# Patient Record
Sex: Female | Born: 1945
Health system: Southern US, Community
[De-identification: ages and names within clinical notes are randomized; demographics above are authoritative.]

## PROBLEM LIST (undated history)

## (undated) DIAGNOSIS — I480 Paroxysmal atrial fibrillation: Secondary | ICD-10-CM

## (undated) DIAGNOSIS — Z7901 Long term (current) use of anticoagulants: Secondary | ICD-10-CM

## (undated) DIAGNOSIS — I209 Angina pectoris, unspecified: Secondary | ICD-10-CM

## (undated) DIAGNOSIS — Z91148 Patient's other noncompliance with medication regimen for other reason: Secondary | ICD-10-CM

## (undated) DIAGNOSIS — I2510 Atherosclerotic heart disease of native coronary artery without angina pectoris: Secondary | ICD-10-CM

## (undated) DIAGNOSIS — G4733 Obstructive sleep apnea (adult) (pediatric): Secondary | ICD-10-CM

## (undated) DIAGNOSIS — Z9114 Patient's other noncompliance with medication regimen: Secondary | ICD-10-CM

## (undated) DIAGNOSIS — I428 Other cardiomyopathies: Secondary | ICD-10-CM

## (undated) DIAGNOSIS — I10 Essential (primary) hypertension: Secondary | ICD-10-CM

## (undated) DIAGNOSIS — E7800 Pure hypercholesterolemia, unspecified: Secondary | ICD-10-CM

## (undated) DIAGNOSIS — I5022 Chronic systolic (congestive) heart failure: Secondary | ICD-10-CM

## (undated) DIAGNOSIS — Z9989 Dependence on other enabling machines and devices: Secondary | ICD-10-CM

## (undated) DIAGNOSIS — E669 Obesity, unspecified: Secondary | ICD-10-CM

## (undated) DIAGNOSIS — Z95810 Presence of automatic (implantable) cardiac defibrillator: Secondary | ICD-10-CM

## (undated) DIAGNOSIS — R0602 Shortness of breath: Secondary | ICD-10-CM

## (undated) HISTORY — PX: ABDOMINAL HYSTERECTOMY: SHX81

## (undated) HISTORY — DX: Long term (current) use of anticoagulants: Z79.01

## (undated) HISTORY — DX: Atherosclerotic heart disease of native coronary artery without angina pectoris: I25.10

## (undated) HISTORY — PX: TUBAL LIGATION: SHX77

## (undated) HISTORY — PX: CARDIAC CATHETERIZATION: SHX172

---

## 2001-08-13 ENCOUNTER — Inpatient Hospital Stay (HOSPITAL_COMMUNITY): Admission: EM | Admit: 2001-08-13 | Discharge: 2001-08-18 | Payer: Self-pay | Admitting: Emergency Medicine

## 2001-08-13 ENCOUNTER — Encounter: Payer: Self-pay | Admitting: Emergency Medicine

## 2001-08-15 ENCOUNTER — Encounter: Payer: Self-pay | Admitting: Cardiology

## 2004-05-26 ENCOUNTER — Ambulatory Visit: Payer: Self-pay | Admitting: Cardiovascular Disease

## 2004-06-23 ENCOUNTER — Ambulatory Visit: Payer: Self-pay

## 2004-12-31 ENCOUNTER — Ambulatory Visit (HOSPITAL_BASED_OUTPATIENT_CLINIC_OR_DEPARTMENT_OTHER): Admission: RE | Admit: 2004-12-31 | Discharge: 2004-12-31 | Payer: Self-pay | Admitting: Cardiovascular Disease

## 2004-12-31 ENCOUNTER — Ambulatory Visit: Payer: Self-pay | Admitting: *Deleted

## 2004-12-31 ENCOUNTER — Encounter: Payer: Self-pay | Admitting: Pulmonary Disease

## 2005-01-09 ENCOUNTER — Ambulatory Visit: Payer: Self-pay | Admitting: Pulmonary Disease

## 2005-02-02 ENCOUNTER — Ambulatory Visit: Payer: Self-pay | Admitting: Cardiovascular Disease

## 2005-02-20 ENCOUNTER — Ambulatory Visit: Payer: Self-pay | Admitting: Pulmonary Disease

## 2005-03-15 ENCOUNTER — Ambulatory Visit (HOSPITAL_BASED_OUTPATIENT_CLINIC_OR_DEPARTMENT_OTHER): Admission: RE | Admit: 2005-03-15 | Discharge: 2005-03-15 | Payer: Self-pay | Admitting: Pulmonary Disease

## 2005-03-15 ENCOUNTER — Encounter: Payer: Self-pay | Admitting: Pulmonary Disease

## 2005-03-24 ENCOUNTER — Ambulatory Visit: Payer: Self-pay | Admitting: Pulmonary Disease

## 2005-04-01 ENCOUNTER — Ambulatory Visit: Payer: Self-pay | Admitting: Pulmonary Disease

## 2005-07-13 ENCOUNTER — Encounter: Payer: Self-pay | Admitting: Cardiology

## 2005-07-13 ENCOUNTER — Ambulatory Visit: Payer: Self-pay | Admitting: Cardiology

## 2005-07-13 ENCOUNTER — Inpatient Hospital Stay (HOSPITAL_COMMUNITY): Admission: EM | Admit: 2005-07-13 | Discharge: 2005-07-16 | Payer: Self-pay | Admitting: Emergency Medicine

## 2005-07-30 ENCOUNTER — Ambulatory Visit: Payer: Self-pay | Admitting: Cardiovascular Disease

## 2005-08-12 ENCOUNTER — Ambulatory Visit: Payer: Self-pay

## 2005-09-15 ENCOUNTER — Ambulatory Visit: Payer: Self-pay | Admitting: Cardiovascular Disease

## 2007-02-01 ENCOUNTER — Ambulatory Visit: Payer: Self-pay | Admitting: Cardiovascular Disease

## 2007-02-01 ENCOUNTER — Inpatient Hospital Stay (HOSPITAL_COMMUNITY): Admission: EM | Admit: 2007-02-01 | Discharge: 2007-02-11 | Payer: Self-pay | Admitting: Emergency Medicine

## 2007-02-16 ENCOUNTER — Ambulatory Visit: Payer: Self-pay | Admitting: Cardiovascular Disease

## 2007-02-16 LAB — CONVERTED CEMR LAB: Prothrombin Time: 25.2 s — ABNORMAL HIGH (ref 10.9–13.3)

## 2007-02-24 ENCOUNTER — Ambulatory Visit: Payer: Self-pay | Admitting: Cardiovascular Disease

## 2007-02-24 ENCOUNTER — Ambulatory Visit: Payer: Self-pay | Admitting: Cardiology

## 2007-02-24 LAB — CONVERTED CEMR LAB: Prothrombin Time: 31.6 s (ref 10.9–13.3)

## 2007-02-28 ENCOUNTER — Ambulatory Visit: Payer: Self-pay | Admitting: Cardiology

## 2007-03-08 ENCOUNTER — Ambulatory Visit: Payer: Self-pay | Admitting: Internal Medicine

## 2007-03-22 ENCOUNTER — Ambulatory Visit: Payer: Self-pay | Admitting: Cardiology

## 2007-03-29 ENCOUNTER — Ambulatory Visit: Payer: Self-pay | Admitting: Cardiovascular Disease

## 2007-04-05 ENCOUNTER — Ambulatory Visit: Payer: Self-pay | Admitting: Cardiovascular Disease

## 2007-04-26 ENCOUNTER — Ambulatory Visit: Payer: Self-pay | Admitting: Cardiovascular Disease

## 2007-04-26 LAB — CONVERTED CEMR LAB
Calcium: 8.9 mg/dL (ref 8.4–10.5)
Creatinine, Ser: 1.3 mg/dL — ABNORMAL HIGH (ref 0.4–1.2)
Glucose, Bld: 90 mg/dL (ref 70–99)
Pro B Natriuretic peptide (BNP): 143 pg/mL — ABNORMAL HIGH (ref 0.0–100.0)
Sodium: 142 meq/L (ref 135–145)

## 2007-05-10 ENCOUNTER — Ambulatory Visit: Payer: Self-pay | Admitting: Cardiovascular Disease

## 2007-05-24 ENCOUNTER — Ambulatory Visit: Payer: Self-pay | Admitting: Internal Medicine

## 2007-06-08 ENCOUNTER — Ambulatory Visit: Payer: Self-pay | Admitting: Cardiovascular Disease

## 2007-06-24 ENCOUNTER — Ambulatory Visit: Payer: Self-pay | Admitting: Cardiology

## 2007-07-04 ENCOUNTER — Ambulatory Visit: Payer: Self-pay | Admitting: Cardiology

## 2007-08-02 ENCOUNTER — Ambulatory Visit: Payer: Self-pay | Admitting: Cardiology

## 2007-08-16 ENCOUNTER — Ambulatory Visit: Payer: Self-pay | Admitting: Internal Medicine

## 2007-09-13 ENCOUNTER — Ambulatory Visit: Payer: Self-pay | Admitting: Cardiology

## 2007-09-22 ENCOUNTER — Ambulatory Visit: Payer: Self-pay | Admitting: Cardiology

## 2007-10-06 ENCOUNTER — Ambulatory Visit: Payer: Self-pay | Admitting: Cardiology

## 2007-11-02 ENCOUNTER — Ambulatory Visit: Payer: Self-pay | Admitting: Cardiovascular Disease

## 2007-11-20 DIAGNOSIS — G4731 Primary central sleep apnea: Secondary | ICD-10-CM

## 2007-11-20 DIAGNOSIS — I509 Heart failure, unspecified: Secondary | ICD-10-CM | POA: Insufficient documentation

## 2007-11-20 DIAGNOSIS — I272 Pulmonary hypertension, unspecified: Secondary | ICD-10-CM

## 2007-11-20 DIAGNOSIS — I4891 Unspecified atrial fibrillation: Secondary | ICD-10-CM

## 2007-11-20 DIAGNOSIS — I2510 Atherosclerotic heart disease of native coronary artery without angina pectoris: Secondary | ICD-10-CM

## 2007-12-07 ENCOUNTER — Ambulatory Visit: Payer: Self-pay | Admitting: Internal Medicine

## 2007-12-16 ENCOUNTER — Ambulatory Visit: Payer: Self-pay | Admitting: Cardiology

## 2008-01-04 ENCOUNTER — Ambulatory Visit: Payer: Self-pay | Admitting: Cardiology

## 2008-01-24 ENCOUNTER — Ambulatory Visit: Payer: Self-pay | Admitting: Cardiology

## 2008-02-21 ENCOUNTER — Ambulatory Visit: Payer: Self-pay | Admitting: Cardiology

## 2008-03-20 ENCOUNTER — Ambulatory Visit: Payer: Self-pay | Admitting: Cardiology

## 2008-04-17 ENCOUNTER — Ambulatory Visit: Payer: Self-pay | Admitting: Cardiology

## 2008-04-25 ENCOUNTER — Telehealth: Payer: Self-pay | Admitting: Cardiovascular Disease

## 2008-05-02 ENCOUNTER — Telehealth: Payer: Self-pay | Admitting: Cardiovascular Disease

## 2008-05-15 ENCOUNTER — Ambulatory Visit: Payer: Self-pay | Admitting: Internal Medicine

## 2008-05-23 ENCOUNTER — Telehealth: Payer: Self-pay | Admitting: Cardiovascular Disease

## 2008-05-31 ENCOUNTER — Encounter: Payer: Self-pay | Admitting: Cardiovascular Disease

## 2008-06-05 ENCOUNTER — Encounter: Payer: Self-pay | Admitting: *Deleted

## 2008-06-07 ENCOUNTER — Telehealth: Payer: Self-pay | Admitting: Cardiovascular Disease

## 2008-06-19 ENCOUNTER — Ambulatory Visit: Payer: Self-pay | Admitting: Internal Medicine

## 2008-06-19 ENCOUNTER — Encounter (INDEPENDENT_AMBULATORY_CARE_PROVIDER_SITE_OTHER): Payer: Self-pay | Admitting: Cardiology

## 2008-06-19 LAB — CONVERTED CEMR LAB: POC INR: 2.1

## 2008-07-11 ENCOUNTER — Encounter: Payer: Self-pay | Admitting: *Deleted

## 2008-07-17 ENCOUNTER — Ambulatory Visit: Payer: Self-pay | Admitting: Cardiovascular Disease

## 2008-07-17 ENCOUNTER — Encounter (INDEPENDENT_AMBULATORY_CARE_PROVIDER_SITE_OTHER): Payer: Self-pay | Admitting: Cardiology

## 2008-07-17 LAB — CONVERTED CEMR LAB: Prothrombin Time: 16.1 s

## 2008-08-13 ENCOUNTER — Ambulatory Visit: Payer: Self-pay | Admitting: Cardiology

## 2008-09-11 ENCOUNTER — Ambulatory Visit: Payer: Self-pay | Admitting: Cardiology

## 2008-09-11 LAB — CONVERTED CEMR LAB: POC INR: 4.3

## 2008-10-08 ENCOUNTER — Encounter: Payer: Self-pay | Admitting: Cardiovascular Disease

## 2008-10-16 ENCOUNTER — Ambulatory Visit: Payer: Self-pay | Admitting: Cardiology

## 2008-11-22 ENCOUNTER — Ambulatory Visit: Payer: Self-pay | Admitting: Cardiology

## 2008-11-22 LAB — CONVERTED CEMR LAB: POC INR: 3.2

## 2008-12-13 ENCOUNTER — Ambulatory Visit: Payer: Self-pay | Admitting: Cardiovascular Disease

## 2009-01-06 ENCOUNTER — Emergency Department (HOSPITAL_COMMUNITY): Admission: EM | Admit: 2009-01-06 | Discharge: 2009-01-06 | Payer: Self-pay | Admitting: Emergency Medicine

## 2009-01-25 ENCOUNTER — Encounter (INDEPENDENT_AMBULATORY_CARE_PROVIDER_SITE_OTHER): Payer: Self-pay | Admitting: Cardiology

## 2009-02-27 ENCOUNTER — Encounter (INDEPENDENT_AMBULATORY_CARE_PROVIDER_SITE_OTHER): Payer: Self-pay | Admitting: Cardiology

## 2009-04-06 ENCOUNTER — Ambulatory Visit: Payer: Self-pay | Admitting: Cardiovascular Disease

## 2009-04-06 ENCOUNTER — Ambulatory Visit: Payer: Self-pay | Admitting: Diagnostic Radiology

## 2009-04-06 ENCOUNTER — Encounter: Payer: Self-pay | Admitting: Emergency Medicine

## 2009-04-06 ENCOUNTER — Inpatient Hospital Stay (HOSPITAL_COMMUNITY): Admission: EM | Admit: 2009-04-06 | Discharge: 2009-04-07 | Payer: Self-pay | Admitting: Cardiovascular Disease

## 2009-04-06 ENCOUNTER — Encounter: Payer: Self-pay | Admitting: Cardiovascular Disease

## 2009-04-10 ENCOUNTER — Encounter: Payer: Self-pay | Admitting: Cardiovascular Disease

## 2009-04-10 ENCOUNTER — Ambulatory Visit: Payer: Self-pay | Admitting: Cardiology

## 2009-04-19 ENCOUNTER — Ambulatory Visit: Payer: Self-pay | Admitting: Internal Medicine

## 2009-04-19 LAB — CONVERTED CEMR LAB: POC INR: 1.9

## 2009-05-02 ENCOUNTER — Ambulatory Visit: Payer: Self-pay | Admitting: Cardiology

## 2009-05-02 ENCOUNTER — Ambulatory Visit: Payer: Self-pay | Admitting: Cardiovascular Disease

## 2009-05-02 LAB — CONVERTED CEMR LAB: POC INR: 4.4

## 2009-05-07 ENCOUNTER — Encounter (INDEPENDENT_AMBULATORY_CARE_PROVIDER_SITE_OTHER): Payer: Self-pay | Admitting: *Deleted

## 2009-05-16 ENCOUNTER — Ambulatory Visit: Payer: Self-pay | Admitting: Pulmonary Disease

## 2009-05-17 ENCOUNTER — Ambulatory Visit: Payer: Self-pay | Admitting: Cardiovascular Disease

## 2009-05-17 ENCOUNTER — Ambulatory Visit (HOSPITAL_COMMUNITY): Admission: RE | Admit: 2009-05-17 | Discharge: 2009-05-17 | Payer: Self-pay | Admitting: Cardiovascular Disease

## 2009-06-06 ENCOUNTER — Ambulatory Visit: Payer: Self-pay | Admitting: Internal Medicine

## 2009-06-27 ENCOUNTER — Ambulatory Visit: Payer: Self-pay | Admitting: Internal Medicine

## 2009-06-27 ENCOUNTER — Ambulatory Visit: Payer: Self-pay | Admitting: Cardiovascular Disease

## 2009-07-01 ENCOUNTER — Ambulatory Visit: Payer: Self-pay | Admitting: Pulmonary Disease

## 2009-07-18 ENCOUNTER — Ambulatory Visit: Payer: Self-pay | Admitting: Internal Medicine

## 2009-07-18 LAB — CONVERTED CEMR LAB: POC INR: 1.6

## 2009-08-01 ENCOUNTER — Ambulatory Visit: Payer: Self-pay | Admitting: Internal Medicine

## 2009-08-01 LAB — CONVERTED CEMR LAB: POC INR: 1.8

## 2009-09-27 ENCOUNTER — Encounter (INDEPENDENT_AMBULATORY_CARE_PROVIDER_SITE_OTHER): Payer: Self-pay | Admitting: Pharmacist

## 2009-11-11 ENCOUNTER — Telehealth (INDEPENDENT_AMBULATORY_CARE_PROVIDER_SITE_OTHER): Payer: Self-pay | Admitting: *Deleted

## 2010-01-01 ENCOUNTER — Ambulatory Visit: Payer: Self-pay | Admitting: Pulmonary Disease

## 2010-01-29 ENCOUNTER — Encounter (INDEPENDENT_AMBULATORY_CARE_PROVIDER_SITE_OTHER): Payer: Self-pay | Admitting: Pharmacist

## 2010-02-04 NOTE — Medication Information (Signed)
Summary: rov/sp  Anticoagulant Therapy  Managed by: Leota Sauers, PharmD, BCPS, CPP Referring MD: Dr. Charlton Haws Supervising MD: Daleen Squibb MD, Maisie Fus Indication 1: Atrial Fibrillation (ICD-427.31) Lab Used: [facility] [place] Site: [address] INR POC 4.4 INR RANGE 2-3  Dietary changes: yes       Details: decreased greens  Health status changes: no    Bleeding/hemorrhagic complications: no    Recent/future hospitalizations: no    Any changes in medication regimen? no    Recent/future dental: no  Any missed doses?: yes     Details: missed one day  Is patient compliant with meds? yes       Current Medications (verified): 1)  Aspirin 81 Mg Tbec (Aspirin) .... Take One Tablet By Mouth Daily 2)  Furosemide 20 Mg Tabs (Furosemide) .... Take 3 Tabs Two Times A Day. 3)  Coreg 12.5 Mg Tabs (Carvedilol) .Marland Kitchen.. 1 Tab Two Times A Day 4)  Amiodarone Hcl 200 Mg Tabs (Amiodarone Hcl) .... Take One Tablet By Mouth Two Times A Day 5)  Cozaar 25 Mg Tabs (Losartan Potassium) .... Take One Tablet By Mouth Daily 6)  Hydralazine Hcl 50 Mg Tabs (Hydralazine Hcl) .... 1/2 Tab Three Times A Day 7)  Revatio 20 Mg Tabs (Sildenafil Citrate) .Marland Kitchen.. 1 Tab Three Times A Day 8)  Potassium Chloride Crys Cr 20 Meq Cr-Tabs (Potassium Chloride Crys Cr) .... Take One Tablet By Mouth Daily 9)  Warfarin Sodium 5 Mg Tabs (Warfarin Sodium) .... Use As Directed By Anticoagulation Clinic  Allergies (verified): No Known Drug Allergies  Anticoagulation Management History:      The patient is taking warfarin and comes in today for a routine follow up visit.  Negative risk factors for bleeding include an age less than 37 years old.  The bleeding index is 'low risk'.  Positive CHADS2 values include History of CHF.  Negative CHADS2 values include Age > 41 years old.  The start date was 02/10/2007.  Her last INR was 5.7 RATIO.  Anticoagulation responsible provider: Daleen Squibb MD, Maisie Fus.  INR POC: 4.4.  Cuvette Lot#:  [id number].  Exp: 05/2010.    Anticoagulation Management Assessment/Plan:      The patient's current anticoagulation dose is Warfarin sodium 5 mg tabs: Use as directed by Anticoagulation Clinic.  The target INR is 2.0-3.0.  The next INR is due 05/16/2009.  Anticoagulation instructions were given to patient.  Results were reviewed/authorized by Leota Sauers, PharmD, BCPS, CPP.         Prior Anticoagulation Instructions: INR 1.9. Take 1 tablet today, then take 1 tablet daily except 0.5 tablet Mon, Wed, Fri.  Current Anticoagulation Instructions: INR 4.4  Ok to eat some greens 2-3 days a week No Coumadin today Thur 4/28 Then coumadin 1 tab = 5mg  on Tu, Thur, Sat, Sun 1/2 tab = 2.5 mg on Mon, Wed , Fri

## 2010-02-04 NOTE — Assessment & Plan Note (Signed)
Summary: eph/dm   CC:  sob.  History of Present Illness: Erin [name] returns today for F/U of anticoagulaiton, afib, DCM non-ischemic.  Recent hospitalization for arm pain R/O .  Normal cath in 2009.  At that time her PA pressure was 65 mmHg systolic.  She is functional class 2.  She was seen in the ER for 'bronchitis" since hospital D/C and Rx with breathing Rx's.  I filled out a handicap placard for her today.  She has exertional dyspnea but less edema and no SSCP, palpitations, or syncope.  She has not had her EF checked since cath in 2009 when it was 15-20%.  She has had PAF and is on chronic anticoagulation and amiodarone.  She is going to get her coumadin checked today as it has been low the last few weeks.  She needs F/U with Dr Shelle Iron for sleep apnea.  She does not like to wear her mask.  I discussed the importance of oxygenation at night in regard to her CHF.  She is tolerating her revatio for pulmonary hypertension with no side effects.  I think the best test to reassess her CHF would be an MRI as echo images are poor due to her body habitus.  She will need F/u DlCO and TSH, LFTS, BNP in about 8 weeks to assess her CHF and follow her amiodarone.  Current Problems (verified): 1)  Coumadin Therapy  (ICD-V58.61) 2)  Coronary Atherosclerosis Native Coronary Artery  (ICD-414.01) 3)  Obstructive Sleep Apnea  (ICD-327.23) 4)  Pulmonary Hypertension  (ICD-416.8) 5)  Congestive Heart Failure, Chronic  (ICD-428.0) 6)  Atrial Fibrillation  (ICD-427.31)  Current Medications (verified): 1)  Aspirin 81 Mg Tbec (Aspirin) .... Take One Tablet By Mouth Daily 2)  Furosemide 20 Mg Tabs (Furosemide) .... Take 3 Tabs Two Times A Day. 3)  Coreg 12.5 Mg Tabs (Carvedilol) .Marland Kitchen.. 1 Tab Two Times A Day 4)  Amiodarone Hcl 200 Mg Tabs (Amiodarone Hcl) .... Take One Tablet By Mouth Two Times A Day 5)  Cozaar 25 Mg Tabs (Losartan Potassium) .... Take One Tablet By Mouth Daily 6)  Hydralazine Hcl 50 Mg Tabs (Hydralazine  Hcl) .... 1/2 Tab Three Times A Day 7)  Revatio 20 Mg Tabs (Sildenafil Citrate) .Marland Kitchen.. 1 Tab Three Times A Day 8)  Potassium Chloride Crys Cr 20 Meq Cr-Tabs (Potassium Chloride Crys Cr) .... Take One Tablet By Mouth Daily 9)  Warfarin Sodium 5 Mg Tabs (Warfarin Sodium) .... Use As Directed By Anticoagulation Clinic  Allergies (verified): No Known Drug Allergies  Past History:  Past Medical History: Last updated: 11/20/2007 Congestive Heart Failure: non-ischemic but moderate LAD disease Pulmonary Hypertension PAF/Atrial Tachycardia COPD Obesity Sleep Apnea Mitral Regurgitation Hypertension EPS:  02/03/2007 Graciela Husbands no AICD due to comorbidities and Class 4 symptoms  Past Surgical History: Last updated: 11/20/2007 Hysterectomy:  1996 fibroids  Family History: Last updated: 11/20/2007 Mother, Father and Brother with M.I.  Social History: Last updated: 11/20/2007 Lives in [place] with daughter Non-smoker Non-drinker  Review of Systems       Denies fever, malais, weight loss, blurry vision, decreased visual acuity, cough, sputum, hemoptysis, pleuritic pain, palpitaitons, heartburn, abdominal pain, melena, lower extremity edema, claudication, or rash.   Vital Signs:  Patient profile:   65 year old female Height:      61 inches Weight:      226 pounds BMI:     42.86 Pulse rate:   79 / minute Resp:     14 per minute BP  sitting:   151 / 88  (left arm)  Vitals Entered By: Kem Parkinson (May 02, 2009 9:12 AM)  Physical Exam  General:  Affect appropriate Healthy:  appears stated age HEENT: normal Neck supple with no adenopathy JVP normal no bruits no thyromegaly Lungs clear with no wheezing and good diaphragmatic motion Heart:  S1/S2 no murmur,rub, gallop or click PMI normal Abdomen: benighn, BS positve, no tenderness, no AAA no bruit.  No HSM or HJR Distal pulses intact with no bruits No edema Neuro non-focal Skin warm and dry    Impression &  Recommendations:  Problem # 1:  COUMADIN THERAPY (ICD-V58.61) F/U coumadin clinic today.  No bleeding diathesis  Problem # 2:  OBSTRUCTIVE SLEEP APNEA (ICD-327.23) F/U Dr Shelle Iron to see if we can improve compliance with CPAP.    Problem # 3:  PULMONARY HYPERTENSION (ICD-416.8) Secondary to LV dysfunction Continue revatio and current dose of diuretics.    Problem # 4:  CONGESTIVE HEART FAILURE, CHRONIC (ICD-428.0) Stable.  MRI to assess RV and LV function.  F.U SK to reconsider biV pacer.  BNP in 8 weeks Her updated medication list for this problem includes:    Aspirin 81 Mg Tbec (Aspirin) .Marland Kitchen... Take one tablet by mouth daily    Furosemide 20 Mg Tabs (Furosemide) .Marland Kitchen... Take 3 tabs two times a day.    Coreg 12.5 Mg Tabs (Carvedilol) .Marland Kitchen... 1 tab two times a day    Amiodarone Hcl 200 Mg Tabs (Amiodarone hcl) .Marland Kitchen... Take one tablet by mouth two times a day    Cozaar 25 Mg Tabs (Losartan potassium) .Marland Kitchen... Take one tablet by mouth daily    Warfarin Sodium 5 Mg Tabs (Warfarin sodium) ..... Use as directed by anticoagulation clinic  Orders: EP Referral (Cardiology EP Ref )  Problem # 5:  ATRIAL FIBRILLATION (ICD-427.31) Maint NSR.  INR today,  Continue low dose amiodarone with surveilance DLCO and labs in 8 weeks Her updated medication list for this problem includes:    Aspirin 81 Mg Tbec (Aspirin) .Marland Kitchen... Take one tablet by mouth daily    Coreg 12.5 Mg Tabs (Carvedilol) .Marland Kitchen... 1 tab two times a day    Amiodarone Hcl 200 Mg Tabs (Amiodarone hcl) .Marland Kitchen... Take one tablet by mouth two times a day    Warfarin Sodium 5 Mg Tabs (Warfarin sodium) ..... Use as directed by anticoagulation clinic  Other Orders: Pulmonary Referral (Pulmonary) Cardiac MRI (Cardiac MRI)  Patient Instructions: 1)  Your physician recommends that you schedule a follow-up appointment in: 2 months 2)  You have been referred to Dr. Graciela Husbands (EP doctor) and Dr. Shelle Iron (pulmonary) 3)  Your physician has requested that you have a  cardiac MRI.  Cardiac MRI uses a computer to create images of your heart as it's beating, producing both still and moving pictures of your heart and major blood vessels. For further information please visit  [email or web address]  Please follow the instruction sheet given to you today for more information.   EKG Report  Procedure date:  04/06/2009  Findings:      NSR 76 Nonspecific ST/T wave abnormalities QT 434 No significant change since prev

## 2010-02-04 NOTE — Assessment & Plan Note (Signed)
Summary: rov for osa   Copy to:  Charlton Haws Primary Provider/Referring Provider:  None  CC:  Pt is here for a 4 week f/u appt.    Pt tates she is using her cpap machine "most nights."  Approx 5 to 6 hours per night.  Pt states she has difficulty with chin strap staying on  and also c/o mask leaks.  Pt denied any complaints with pressure.  Marland Kitchen  History of Present Illness: the pt comes in today for f/u of here severe complex apnea.  She was started on bipap at last visit at a low pressure level to allow for desensitization.  She has been able to wear the device for 5-6 hrs at a time, and thnks is better than cpap.  She is not having issues with pressure tolerance.  She is having issues with mask fit however.  She is using nasal mask with chin strap, and is very uncomfortable for her.  The chin strap keeps slipping, and the current mask leaks.  She is not able to tell yet whether this will help her sleep.  Medications Prior to Update: 1)  Aspirin 81 Mg Tbec (Aspirin) .... Take One Tablet By Mouth Daily 2)  Furosemide 20 Mg Tabs (Furosemide) .... Take 3 Tabs Two Times A Day. 3)  Coreg 12.5 Mg Tabs (Carvedilol) .Marland Kitchen.. 1 Tab Two Times A Day 4)  Amiodarone Hcl 200 Mg Tabs (Amiodarone Hcl) .... Take One Tablet By Mouth Two Times A Day 5)  Cozaar 25 Mg Tabs (Losartan Potassium) .... Take One Tablet By Mouth Daily 6)  Hydralazine Hcl 50 Mg Tabs (Hydralazine Hcl) .... 1/2 Tab Three Times A Day 7)  Revatio 20 Mg Tabs (Sildenafil Citrate) .Marland Kitchen.. 1 Tab Three Times A Day 8)  Potassium Chloride Crys Cr 20 Meq Cr-Tabs (Potassium Chloride Crys Cr) .... Take One Tablet By Mouth Daily 9)  Warfarin Sodium 5 Mg Tabs (Warfarin Sodium) .... Use As Directed By Anticoagulation Clinic  Allergies (verified): No Known Drug Allergies  Past History:  Past medical, surgical, family and social histories (including risk factors) reviewed, and no changes noted (except as noted below).  Past Medical History: Reviewed history  from 11/20/2007 and no changes required. Congestive Heart Failure: non-ischemic but moderate LAD disease Pulmonary Hypertension PAF/Atrial Tachycardia COPD Obesity Sleep Apnea Mitral Regurgitation Hypertension EPS:  02/03/2007 Graciela Husbands no AICD due to comorbidities and Class 4 symptoms  Past Surgical History: Reviewed history from 11/20/2007 and no changes required. Hysterectomy:  1996 fibroids  Family History: Reviewed history from 11/20/2007 and no changes required. Mother, Father and Brother with M.I.  Social History: Reviewed history from 05/16/2009 and no changes required. Lives in [place] with daughter Never smoker Non-drinker pt is single. pt has children. pt is retired. prev worked at a bank.   Review of Systems       The patient complains of nasal congestion/difficulty breathing through nose.  The patient denies shortness of breath with activity, shortness of breath at rest, productive cough, non-productive cough, coughing up blood, chest pain, irregular heartbeats, acid heartburn, indigestion, loss of appetite, weight change, abdominal pain, difficulty swallowing, sore throat, tooth/dental problems, headaches, sneezing, itching, ear ache, anxiety, depression, hand/feet swelling, joint stiffness or pain, rash, change in color of mucus, and fever.    Vital Signs:  Patient profile:   65 year old female Height:      61 inches Weight:      219 pounds BMI:     41.53 O2 Sat:  95 % on Room air Temp:     98.0 degrees F oral Pulse rate:   74 / minute BP sitting:   114 / 62  (left arm) Cuff size:   regular  Vitals Entered By: Arman Filter LPN (July 01, 2009 4:03 PM)  O2 Flow:  Room air CC: Pt is here for a 4 week f/u appt.    Pt tates she is using her cpap machine "most nights."  Approx 5 to 6 hours per night.  Pt states she has difficulty with chin strap staying on  and also c/o mask leaks.  Pt denied any complaints with pressure.   Comments Medications reviewed  with patient Arman Filter LPN  July 01, 2009 4:13 PM    Physical Exam  General:  obese female in nad Nose:  no skin breakdown or pressure necrosis from cpap mask Extremities:  mild edema, no cyanosis Neurologic:  alert and oriented, moves all 4.   Impression & Recommendations:  Problem # 1:  OBSTRUCTIVE SLEEP APNEA (ICD-327.23) the pt has severe complex sleep apnea, and we are trying to get her adapted to a positive pressure device.  She is tolerating bipap more than cpap, but we have her at a low level of pressure.  She is not doing well with a nasal mask and chin strap, so will see if we can get her a full face mask that she can tolerate.  We also need to have her pressure optimized at sleep center given her complex apnea, and she is agreeable.  I have also encouraged her again to work on weight loss.  Other Orders: Sleep Disorder Referral (Sleep Disorder) DME Referral (DME) Est. Patient Level IV (45409)  Patient Instructions: 1)  will get you fitted with a full face mask, so you can get rid of chin strap 2)  will set up study at sleep center to adjust your pressure on your machine.  I will let you know the results. 3)  please bring your new mask to the sleep center for your study. 4)  work on weight loss 5)  followup with me in 6mos, but call if having issues with bipap.

## 2010-02-04 NOTE — Letter (Signed)
Summary: [facility]inqu[facility]in [facility]  Coumadin  [address]   [place], [place] [address]   Phone: [phone]  Fax: [phone]     September 27, 2009 MRN: [id number]   [facility] [address] [place], [place]  [address]   Dear Ms. Stoke,  This letter is being sent to you as a reminder that it is necessary for you to get your INR/PT checked regularly so that we can optimize your care.  Our records indicate that you were scheduled to have a test done recently.  As of today, we have not received the results of this test.  It is very important that you have your INR checked.  Please call our office at the number listed above to schedule an appointment at your earliest convenience.    If you have recently had your protime checked or have discontinued this medication, please contact our office at the above phone number to clarify this issue.  Thank you for this prompt attention to this important health care matter.  Sincerely,   [facility] Team

## 2010-02-04 NOTE — Medication Information (Signed)
Summary: rov/tm  Anticoagulant Therapy  Managed by: Elaina Pattee, PharmD Supervising MD: Graciela Husbands MD, Viviann Spare Indication 1: Atrial Fibrillation (ICD-427.31) Lab Used: [facility] [place] Site: [address] INR POC 1.9 INR RANGE 2-3  Dietary changes: yes       Details: Has eaten more greens than usual.  Health status changes: no    Bleeding/hemorrhagic complications: no    Recent/future hospitalizations: no    Any changes in medication regimen? no    Recent/future dental: no  Any missed doses?: no       Is patient compliant with meds? yes       Allergies: No Known Drug Allergies  Anticoagulation Management History:      The patient is taking warfarin and comes in today for a routine follow up visit.  Negative risk factors for bleeding include an age less than 62 years old.  The bleeding index is 'low risk'.  Positive CHADS2 values include History of CHF.  Negative CHADS2 values include Age > 74 years old.  The start date was 02/10/2007.  Her last INR was 5.7 RATIO.  Anticoagulation responsible provider: Graciela Husbands MD, Viviann Spare.  INR POC: 1.9.  Cuvette Lot#: [id number].  Exp: 05/2010.    Anticoagulation Management Assessment/Plan:      The patient's current anticoagulation dose is Warfarin sodium 5 mg tabs: Use as directed by Anticoagulation Clinic.  The target INR is 2.0-3.0.  The next INR is due 05/07/2009.  Anticoagulation instructions were given to patient.  Results were reviewed/authorized by Elaina Pattee, PharmD.  She was notified by Elaina Pattee, PharmD.         Prior Anticoagulation Instructions: INR 2.0 Today take 1 pill Continue 1 pill everyday except 1/2 pill on Mondays, Wednesdays and Fridays. Recheck in 10 days.   Current Anticoagulation Instructions: INR 1.9. Take 1 tablet today, then take 1 tablet daily except 0.5 tablet Mon, Wed, Fri.

## 2010-02-04 NOTE — Assessment & Plan Note (Signed)
Summary: per check out/sf   Visit Type:  2 mo f/u Referring Provider:  Charlton Haws Primary Provider:  None  CC:  sob today...no other complaints today.  History of Present Illness: Erin [name] returns today for F/U of anticoagulaiton, afib, DCM non-ischemic.  Recent hospitalization for arm pain R/O .  Normal cath in 2009.  At that time her PA pressure was 65 mmHg systolic.  She is functional class 2.  She was seen in the ER for 'bronchitis" since hospital D/C and Rx with breathing Rx's.  I filled out a handicap placard for her today.  She has exertional dyspnea but less edema and no SSCP, palpitations, or syncope.  EF by MRI on 05/17/09 was 44% with no scar and global hypokinesis.  Her dyspnea is at baseline.  She has F/U with pulmonary to monitor her revatio and CPAP.  We will not need to do a F/U right heart cath unless her symptoms change.  He body habitus with make this somewhat difficult  Current Problems (verified): 1)  Coumadin Therapy  (ICD-V58.61) 2)  Coronary Atherosclerosis Native Coronary Artery  (ICD-414.01) 3)  Obstructive Sleep Apnea  (ICD-327.23) 4)  Pulmonary Hypertension  (ICD-416.8) 5)  Congestive Heart Failure, Chronic  (ICD-428.0) 6)  Atrial Fibrillation  (ICD-427.31)  Current Medications (verified): 1)  Aspirin 81 Mg Tbec (Aspirin) .... Take One Tablet By Mouth Daily 2)  Furosemide 20 Mg Tabs (Furosemide) .... Take 3 Tabs Two Times A Day. 3)  Coreg 12.5 Mg Tabs (Carvedilol) .Marland Kitchen.. 1 Tab Two Times A Day 4)  Amiodarone Hcl 200 Mg Tabs (Amiodarone Hcl) .... Take One Tablet By Mouth Two Times A Day 5)  Cozaar 25 Mg Tabs (Losartan Potassium) .... Take One Tablet By Mouth Daily 6)  Hydralazine Hcl 50 Mg Tabs (Hydralazine Hcl) .... 1/2 Tab Three Times A Day 7)  Revatio 20 Mg Tabs (Sildenafil Citrate) .Marland Kitchen.. 1 Tab Three Times A Day 8)  Potassium Chloride Crys Cr 20 Meq Cr-Tabs (Potassium Chloride Crys Cr) .... Take One Tablet By Mouth Daily 9)  Warfarin Sodium 5 Mg Tabs (Warfarin  Sodium) .... Use As Directed By Anticoagulation Clinic  Allergies (verified): No Known Drug Allergies  Past History:  Past Medical History: Last updated: 11/20/2007 Congestive Heart Failure: non-ischemic but moderate LAD disease Pulmonary Hypertension PAF/Atrial Tachycardia COPD Obesity Sleep Apnea Mitral Regurgitation Hypertension EPS:  02/03/2007 Graciela Husbands no AICD due to comorbidities and Class 4 symptoms  Past Surgical History: Last updated: 11/20/2007 Hysterectomy:  1996 fibroids  Family History: Last updated: 11/20/2007 Mother, Father and Brother with M.I.  Social History: Last updated: 05/16/2009 Lives in [place] with daughter Never smoker Non-drinker pt is single. pt has children. pt is retired. prev worked at a bank.   Review of Systems       Denies fever, malais, weight loss, blurry vision, decreased visual acuity, cough, sputum, hemoptysis, pleuritic pain, palpitaitons, heartburn, abdominal pain, melena, lower extremity edema, claudication, or rash.   Vital Signs:  Patient profile:   65 year old female Height:      61 inches Weight:      219 pounds BMI:     41.53 Pulse rate:   77 / minute Pulse rhythm:   regular BP sitting:   134 / 70  (left arm) Cuff size:   regular  Vitals Entered By: Danielle Rankin, CMA (June 27, 2009 4:08 PM)  Physical Exam  General:  Affect appropriate Healthy:  appears stated age HEENT: normal Neck supple with no adenopathy JVP  normal no bruits no thyromegaly Lungs clear with no wheezing and good diaphragmatic motion Heart:  S1/S2 no murmur,rub, gallop or click PMI normal Abdomen: benighn, BS positve, no tenderness, no AAA no bruit.  No HSM or HJR Distal pulses intact with no bruits No edema Neuro non-focal Skin warm and dry    Impression & Recommendations:  Problem # 1:  COUMADIN THERAPY (ICD-V58.61) F/U with clinic .  No bleeding dathesis  Problem # 2:  PULMONARY HYPERTENSION (ICD-416.8) Contnue revatio and  CPAP.  Dyspnea at baseline  Problem # 3:  ATRIAL FIBRILLATION (ICD-427.31) In NSR.  Combinatin of PAF and pulmonary hypertension indicates long term coumadin Her updated medication list for this problem includes:    Aspirin 81 Mg Tbec (Aspirin) .Marland Kitchen... Take one tablet by mouth daily    Coreg 12.5 Mg Tabs (Carvedilol) .Marland Kitchen... 1 tab two times a day    Amiodarone Hcl 200 Mg Tabs (Amiodarone hcl) .Marland Kitchen... Take one tablet by mouth two times a day    Warfarin Sodium 5 Mg Tabs (Warfarin sodium) ..... Use as directed by anticoagulation clinic  Patient Instructions: 1)  Your physician recommends that you schedule a follow-up appointment in: 6 MONTHS   EKG Report  Procedure date:  06/27/2009  Findings:      NSR 77 Poor R wave progression QT 446 Nonspecific T wave changes

## 2010-02-04 NOTE — Letter (Signed)
Summary: RSVP  RSVP   Imported By: Marylou Mccoy 06/14/2009 14:36:08  _____________________________________________________________________  External Attachment:    Type:   Image     Comment:   External Document

## 2010-02-04 NOTE — Assessment & Plan Note (Signed)
Summary: rov for central and obstr. sleep apnea.   Copy to:  Charlton Haws Primary Provider/Referring Provider:  None   History of Present Illness: the pt is a 65y/o female who I have seen in the past for severe osa.  She was last seen in 2007, where she was found to have severe mixed apnea, with AHI 77/hr.  She required a very high pressure by her f/u titration study, with optimal pressure being 22/17.  The decision was made to start her on a more moderate pressure to see how she would do.  She was started on bipap, and haver not seen her since.  She did use the cpap off and on over the years, but has not worn in quite some time.  She does not feel that she is unrested upon arising, and denies significant EDS.  But does fall asleep watching tv in the evenings.  Her issue with bipap is that it makes her feel like she is "smothering".  She was not aware of the ramp or how to adjust.  She does require a full face mask due to mouth opening.  Her weight has gone up 37 pounds since her sleep study.  Medications Prior to Update: 1)  Aspirin 81 Mg Tbec (Aspirin) .... Take One Tablet By Mouth Daily 2)  Furosemide 20 Mg Tabs (Furosemide) .... Take 3 Tabs Two Times A Day. 3)  Coreg 12.5 Mg Tabs (Carvedilol) .Marland Kitchen.. 1 Tab Two Times A Day 4)  Amiodarone Hcl 200 Mg Tabs (Amiodarone Hcl) .... Take One Tablet By Mouth Two Times A Day 5)  Cozaar 25 Mg Tabs (Losartan Potassium) .... Take One Tablet By Mouth Daily 6)  Hydralazine Hcl 50 Mg Tabs (Hydralazine Hcl) .... 1/2 Tab Three Times A Day 7)  Revatio 20 Mg Tabs (Sildenafil Citrate) .Marland Kitchen.. 1 Tab Three Times A Day 8)  Potassium Chloride Crys Cr 20 Meq Cr-Tabs (Potassium Chloride Crys Cr) .... Take One Tablet By Mouth Daily 9)  Warfarin Sodium 5 Mg Tabs (Warfarin Sodium) .... Use As Directed By Anticoagulation Clinic  Allergies (verified): No Known Drug Allergies  Family History: Reviewed history from 11/20/2007 and no changes required. Mother, Father and Brother  with M.I.  Social History: Lives in [place] with daughter Never smoker Non-drinker pt is single. pt has children. pt is retired. prev worked at a bank.   Review of Systems       The patient complains of shortness of breath with activity and non-productive cough.  The patient denies shortness of breath at rest, productive cough, coughing up blood, chest pain, irregular heartbeats, acid heartburn, indigestion, loss of appetite, weight change, abdominal pain, difficulty swallowing, sore throat, tooth/dental problems, headaches, nasal congestion/difficulty breathing through nose, sneezing, itching, ear ache, anxiety, depression, hand/feet swelling, joint stiffness or pain, rash, change in color of mucus, and fever.    Vital Signs:  Patient profile:   65 year old female Height:      61 inches Weight:      225 pounds BMI:     42.67 O2 Sat:      90 % on Room air Temp:     98.0 degrees F oral Pulse rate:   87 / minute BP sitting:   146 / 78  (left arm) Cuff size:   large  Vitals Entered By: Arman Filter LPN (May 16, 2009 3:06 PM)  O2 Flow:  Room air Comments Medications reviewed with patient Arman Filter LPN  May 16, 2009 3:12 PM  Physical Exam  General:  obese female in nad Nose:  no skin breakdown or pressure necrosis from cpap mask Extremities:  edema noted, no cyanosis Neurologic:  alert but appears sleepy, moves all 4.   Impression & Recommendations:  Problem # 1:  OBSTRUCTIVE SLEEP APNEA (ICD-327.23) the pt has a history of severe obstructive and central sleep apnea, and has gained considerable weight since that time.  She has significant CV disease that can be negatively impacted by her SDB, and I have stressed to her the importance of treating her osa and working on weight loss.  I would like to start out on a very reasonable pressure and work on desensitization.  Given her weight change, and the complex nature of her sleep apnea, she will need a f/u bipap  titration study once she can wear consistently during the night on the lower pressures.    Other Orders: Est. Patient Level IV ([address]) DME Referral (DME) DME Referral (DME)  Patient Instructions: 1)  will start bipap at 12/8...will get your equipment company to set this and check out your machine. 2)  work on weight loss 3)  wear your bipap during the day or evening for 20-50min while awake to help you get accustomed to wearing device 4)  will get equipment company to show you how to use ramp feature. 5)  followup with me in 4 weeks, but please call if having issues with tolerance.

## 2010-02-04 NOTE — Medication Information (Signed)
Summary: rov/sp  Anticoagulant Therapy  Managed by: Cloyde Reams, RN, BSN Referring MD: Dr. Charlton Haws PCP: None Supervising MD: Graciela Husbands MD, Viviann Spare Indication 1: Atrial Fibrillation (ICD-427.31) Lab Used: [facility] [place] Site: [address] INR RANGE 2-3  Dietary changes: no    Health status changes: no    Bleeding/hemorrhagic complications: no    Recent/future hospitalizations: no    Any changes in medication regimen? no    Recent/future dental: no  Any missed doses?: yes     Details: May have missed 1+ doses of coumadin as recently as 1 week ago.  Is patient compliant with meds? yes       Allergies: No Known Drug Allergies  Anticoagulation Management History:      The patient is taking warfarin and comes in today for a routine follow up visit.  Negative risk factors for bleeding include an age less than 21 years old.  The bleeding index is 'low risk'.  Positive CHADS2 values include History of CHF.  Negative CHADS2 values include Age > 21 years old.  The start date was 02/10/2007.  Her last INR was 5.7 RATIO.  Anticoagulation responsible provider: Graciela Husbands MD, Viviann Spare.  Exp: 05/2010.    Anticoagulation Management Assessment/Plan:      The patient's current anticoagulation dose is Warfarin sodium 5 mg tabs: Use as directed by Anticoagulation Clinic.  The target INR is 2.0-3.0.  The next INR is due 06/24/2009.  Anticoagulation instructions were given to patient.  Results were reviewed/authorized by Cloyde Reams, RN, BSN.  She was notified by Cloyde Reams RN.         Prior Anticoagulation Instructions: INR 4.4  Ok to eat some greens 2-3 days a week No Coumadin today Thur 4/28 Then coumadin 1 tab = 5mg  on Tu, Thur, Sat, Sun 1/2 tab = 2.5 mg on Mon, Wed , Fri  Current Anticoagulation Instructions: INR 1.8  Take 1.5 tablets today, then resume same dosage 1 tablet daily except 1/2 tablets on Mondays, Wednesdays, and Fridays.  Recheck in 2 weeks.

## 2010-02-04 NOTE — Medication Information (Signed)
Summary: Erin [name]  Anticoagulant Therapy  Managed by: Cloyde Reams, RN, BSN Referring MD: Dr. Charlton Haws PCP: None Supervising MD: Graciela Husbands MD, Viviann Spare Indication 1: Atrial Fibrillation (ICD-427.31) Lab Used: [facility]  Site: [address] INR POC 2.9 INR RANGE 2-3  Dietary changes: no    Health status changes: no    Bleeding/hemorrhagic complications: no    Recent/future hospitalizations: no    Any changes in medication regimen? no    Recent/future dental: no  Any missed doses?: yes     Details: Has missed a couple doses unsure of when.    Is patient compliant with meds? yes       Allergies: No Known Drug Allergies  Anticoagulation Management History:      The patient is taking warfarin and comes in today for a routine follow up visit.  Negative risk factors for bleeding include an age less than 45 years old.  The bleeding index is 'low risk'.  Positive CHADS2 values include History of CHF.  Negative CHADS2 values include Age > 84 years old.  The start date was 02/10/2007.  Her last INR was 5.7 RATIO.  Anticoagulation responsible provider: Graciela Husbands MD, Viviann Spare.  INR POC: 2.9.  Cuvette Lot#: [id number].  Exp: 09/2010.    Anticoagulation Management Assessment/Plan:      The patient's current anticoagulation dose is Warfarin sodium 5 mg tabs: Use as directed by Anticoagulation Clinic.  The target INR is 2.0-3.0.  The next INR is due 07/18/2009.  Anticoagulation instructions were given to patient.  Results were reviewed/authorized by Cloyde Reams, RN, BSN.  She was notified by Cloyde Reams RN.         Prior Anticoagulation Instructions: INR 1.8  Take 1.5 tablets today, then resume same dosage 1 tablet daily except 1/2 tablets on Mondays, Wednesdays, and Fridays.  Recheck in 2 weeks.    Current Anticoagulation Instructions: INR 2.9  Continue on same dosage 1 tablet daily except 1/2 tablet on Mondays, Wednesdays, and Fridays.  Recheck in 3 weeks.

## 2010-02-04 NOTE — Letter (Signed)
Summary: Appointment - Cardiac MRI  [facility], Main Office  [address]   [place], [place] [address]   Phone: [phone]  Fax: [phone]      May 07, 2009 MRN: [id number]   [facility] [address] [place], [place]  [address]   Dear Ms. Que,   We have scheduled the above patient for an appointment for a Cardiac MRI on 05/17/2009 at 9:00am.  Please refer to the below information for the location and instructions for this test:  Location:     [facility]       [address]       [place], [place]  [address] Instructions:    Arrive at [facility] Outpatient Registration 45 minutes prior to your appointment time.  This will ensure you are in the Radiology Department 30 minutes prior to your appointment.    There are no restrictions for this test you may eat and take medications as usual.  If you need to reschedule this appointment please call at the number listed above.  Sincerely,  [facility] Scheduling Team

## 2010-02-04 NOTE — Medication Information (Signed)
Summary: CCR/EPH/DM  Anticoagulant Therapy  Managed by: Bethena Midget, RN, BSN Supervising MD: Riley Kill MD, Maisie Fus Indication 1: Atrial Fibrillation (ICD-427.31) Lab Used: [facility] [place] Site: [address] INR POC 2.0 INR RANGE 2-3  Dietary changes: no    Health status changes: no    Bleeding/hemorrhagic complications: no    Recent/future hospitalizations: no    Any changes in medication regimen? yes       Details: same med, doses decreased on some meds  Recent/future dental: no  Any missed doses?: yes     Details: not sure if any were missed last week.   Is patient compliant with meds? yes      Comments: Was in [facility] from 4/1 to 4/3 for back pain and bil. arm pain  Current Medications (verified): 1)  Aspirin 81 Mg Tbec (Aspirin) .... Take One Tablet By Mouth Daily 2)  Furosemide 20 Mg Tabs (Furosemide) .... Take 3 Tabs Two Times A Day. 3)  Coreg 12.5 Mg Tabs (Carvedilol) .Marland Kitchen.. 1 Tab Two Times A Day 4)  Amiodarone Hcl 200 Mg Tabs (Amiodarone Hcl) .... Take One Tablet By Mouth Two Times A Day 5)  Cozaar 25 Mg Tabs (Losartan Potassium) .... Take One Tablet By Mouth Daily 6)  Hydralazine Hcl 50 Mg Tabs (Hydralazine Hcl) .... 1/2 Tab Three Times A Day 7)  Revatio 20 Mg Tabs (Sildenafil Citrate) .Marland Kitchen.. 1 Tab Three Times A Day 8)  Potassium Chloride Crys Cr 20 Meq Cr-Tabs (Potassium Chloride Crys Cr) .... Take One Tablet By Mouth Daily 9)  Warfarin Sodium 5 Mg Tabs (Warfarin Sodium) .... Use As Directed By Anticoagulation Clinic  Allergies (verified): No Known Drug Allergies  Anticoagulation Management History:      The patient is taking warfarin and comes in today for a routine follow up visit.  Negative risk factors for bleeding include an age less than 69 years old.  The bleeding index is 'low risk'.  Positive CHADS2 values include History of CHF.  Negative CHADS2 values include Age > 42 years old.  The start date was 02/10/2007.  Her last INR was 5.7 RATIO.   Anticoagulation responsible provider: Riley Kill MD, Maisie Fus.  INR POC: 2.0.  Cuvette Lot#: [id number].  Exp: 05/2010.    Anticoagulation Management Assessment/Plan:      The patient's current anticoagulation dose is Warfarin sodium 5 mg tabs: Use as directed by Anticoagulation Clinic.  The target INR is 2.0-3.0.  The next INR is due 04/19/2009.  Anticoagulation instructions were given to patient.  Results were reviewed/authorized by Bethena Midget, RN, BSN.  She was notified by Bethena Midget, RN, BSN.         Prior Anticoagulation Instructions: INR 1.9 Resume 1 pill everyday except 1/2 pill on Mondays, Wednesdays and Fridays. Recheck in 10 days.   Current Anticoagulation Instructions: INR 2.0 Today take 1 pill Continue 1 pill everyday except 1/2 pill on Mondays, Wednesdays and Fridays. Recheck in 10 days.

## 2010-02-04 NOTE — Medication Information (Signed)
Summary: rov/tm  Anticoagulant Therapy  Managed by: Bethena Midget, RN, BSN Referring MD: Dr. Charlton Haws PCP: None Supervising MD: Gala Romney MD, Reuel Boom Indication 1: Atrial Fibrillation (ICD-427.31) Lab Used: [facility] [place] Site: [address] INR POC 1.8 INR RANGE 2-3  Dietary changes: no    Health status changes: no    Bleeding/hemorrhagic complications: no    Recent/future hospitalizations: no    Any changes in medication regimen? no    Recent/future dental: no  Any missed doses?: yes     Details: Pt states she missed one day this week and one day last week,states she doesn't know which days.   Is patient compliant with meds? yes       Allergies: No Known Drug Allergies  Anticoagulation Management History:      The patient is taking warfarin and comes in today for a routine follow up visit.  Negative risk factors for bleeding include an age less than 3 years old.  The bleeding index is 'low risk'.  Positive CHADS2 values include History of CHF.  Negative CHADS2 values include Age > 4 years old.  The start date was 02/10/2007.  Her last INR was 5.7 RATIO.  Anticoagulation responsible provider: Braylea Brancato MD, Reuel Boom.  INR POC: 1.8.  Cuvette Lot#: [id number].  Exp: 10/2010.    Anticoagulation Management Assessment/Plan:      The patient's current anticoagulation dose is Warfarin sodium 5 mg tabs: Use as directed by Anticoagulation Clinic.  The target INR is 2.0-3.0.  The next INR is due 08/15/2009.  Anticoagulation instructions were given to patient.  Results were reviewed/authorized by Bethena Midget, RN, BSN.  She was notified by Bethena Midget, RN, BSN.         Prior Anticoagulation Instructions: INR 1.6 Today take 1.5 tablets then resume 1 tablet everday except 1/2 tablets on Mondays, Wednesdays and Fridays. REcheck in 2 weeks.   Current Anticoagulation Instructions: INR 1.8 Today take 1.5 tablets then resume 1 tab everyday except 1/2 tab on Mondays,  Wednesdays and Fridays. Recheck in 2 weeks.

## 2010-02-04 NOTE — Assessment & Plan Note (Signed)
Summary: to discuss biv/sf   Referring Provider:  Charlton Haws Primary Provider:  None  CC:  to discuss bi-v.  History of Present Illness: pts MRI showed EF 44% told her did not need an ICD   she will followup with Dr PN next week as scheduled  NO charge  Current Medications (verified): 1)  Aspirin 81 Mg Tbec (Aspirin) .... Take One Tablet By Mouth Daily 2)  Furosemide 20 Mg Tabs (Furosemide) .... Take 3 Tabs Two Times A Day. 3)  Coreg 12.5 Mg Tabs (Carvedilol) .Marland Kitchen.. 1 Tab Two Times A Day 4)  Amiodarone Hcl 200 Mg Tabs (Amiodarone Hcl) .... Take One Tablet By Mouth Two Times A Day 5)  Cozaar 25 Mg Tabs (Losartan Potassium) .... Take One Tablet By Mouth Daily 6)  Hydralazine Hcl 50 Mg Tabs (Hydralazine Hcl) .... 1/2 Tab Three Times A Day 7)  Revatio 20 Mg Tabs (Sildenafil Citrate) .Marland Kitchen.. 1 Tab Three Times A Day 8)  Potassium Chloride Crys Cr 20 Meq Cr-Tabs (Potassium Chloride Crys Cr) .... Take One Tablet By Mouth Daily 9)  Warfarin Sodium 5 Mg Tabs (Warfarin Sodium) .... Use As Directed By Anticoagulation Clinic  Allergies (verified): No Known Drug Allergies  Past History:  Past Medical History: Last updated: 11/20/2007 Congestive Heart Failure: non-ischemic but moderate LAD disease Pulmonary Hypertension PAF/Atrial Tachycardia COPD Obesity Sleep Apnea Mitral Regurgitation Hypertension EPS:  02/03/2007 Graciela Husbands no AICD due to comorbidities and Class 4 symptoms  Past Surgical History: Last updated: 11/20/2007 Hysterectomy:  1996 fibroids  Family History: Last updated: 11/20/2007 Mother, Father and Brother with M.I.  Social History: Last updated: 05/16/2009 Lives in [place] with daughter Never smoker Non-drinker pt is single. pt has children. pt is retired. prev worked at a bank.   Vital Signs:  Patient profile:   65 year old female Height:      61 inches Weight:      217 pounds BMI:     41.15 Pulse rate:   84 / minute Pulse rhythm:   regular BP sitting:    135 / 77  (left arm) Cuff size:   regular  Vitals Entered By: Judithe Modest CMA (June 06, 2009 2:45 PM)   Other Orders: EKG w/ Interpretation ([address])

## 2010-02-04 NOTE — Progress Notes (Signed)
  Phone Note Call from Patient   Caller: Patient Summary of Call: spoke with pt, she reports only taking one klor con daily. script changed and sent to pharm Deliah Goody, RN  November 11, 2009 5:39 PM     Prescriptions: POTASSIUM CHLORIDE CRYS CR 20 MEQ CR-TABS (POTASSIUM CHLORIDE CRYS CR) Take one tablet by mouth daily  #30 x 12   Entered by:   Deliah Goody, RN   Authorized by:   Colon Branch, MD, [facility]   Signed by:   Deliah Goody, RN on 11/11/2009   Method used:   Electronically to        [facility] [address] [phone]* (retail)       [address]       [place], [place]  [address]       Ph: [phone]       Fax: [phone]   RxID:   [id number]

## 2010-02-04 NOTE — Medication Information (Signed)
Summary: Erin [name]  Anticoagulant Therapy  Managed by: Bethena Midget, RN, BSN Referring MD: Dr. Charlton Haws PCP: None Supervising MD: Graciela Husbands MD, Viviann Spare Indication 1: Atrial Fibrillation (ICD-427.31) Lab Used: [facility]  Site: [address] INR POC 1.6 INR RANGE 2-3  Dietary changes: no    Health status changes: no    Bleeding/hemorrhagic complications: no    Recent/future hospitalizations: no    Any changes in medication regimen? no    Recent/future dental: no  Any missed doses?: no       Is patient compliant with meds? yes       Allergies: No Known Drug Allergies  Anticoagulation Management History:      The patient is taking warfarin and comes in today for a routine follow up visit.  Negative risk factors for bleeding include an age less than 40 years old.  The bleeding index is 'low risk'.  Positive CHADS2 values include History of CHF.  Negative CHADS2 values include Age > 32 years old.  The start date was 02/10/2007.  Her last INR was 5.7 RATIO.  Anticoagulation responsible provider: Graciela Husbands MD, Viviann Spare.  INR POC: 1.6.  Cuvette Lot#: [id number].  Exp: 09/2010.    Anticoagulation Management Assessment/Plan:      The patient's current anticoagulation dose is Warfarin sodium 5 mg tabs: Use as directed by Anticoagulation Clinic.  The target INR is 2.0-3.0.  The next INR is due 08/01/2009.  Anticoagulation instructions were given to patient.  Results were reviewed/authorized by Bethena Midget, RN, BSN.  She was notified by Bethena Midget, RN, BSN.         Prior Anticoagulation Instructions: INR 2.9  Continue on same dosage 1 tablet daily except 1/2 tablet on Mondays, Wednesdays, and Fridays.  Recheck in 3 weeks.    Current Anticoagulation Instructions: INR 1.6 Today take 1.5 tablets then resume 1 tablet everday except 1/2 tablets on Mondays, Wednesdays and Fridays. REcheck in 2 weeks.

## 2010-02-04 NOTE — Letter (Signed)
Summary: [facility] - Delinquent Coumadin 1  Coumadin  [address]   [place], [place] [address]   Phone: [phone]  Fax: [phone]     January 25, 2009 MRN: [id number]   [facility] [address] [place], [place]  [address]   Dear Ms. Mattox,  This letter is being sent to you as a reminder that it is necessary for you to get your INR/PT checked regularly so that we can optimize your care.  Our records indicate that you were scheduled to have a test done recently.  As of today, we have not received the results of this test.  It is very important that you have your INR checked.  Please call our office at the number listed above to schedule an appointment at your earliest convenience.    If you have recently had your protime checked or have discontinued this medication, please contact our office at the above phone number to clarify this issue.  Thank you for this prompt attention to this important health care matter.  Sincerely,   [facility] Cardiovascular Risk Reduction Clinic Team

## 2010-02-04 NOTE — Letter (Signed)
Summary: [facility] - Delinquent Coumadin 2  Coumadin  [address]   [place], [place] [address]   Phone: [phone]  Fax: [phone]     February 27, 2009 MRN: [id number]   [facility] [address] [place], [place]  [address]   Dear Ms. Ruppel,  We have attempted to contact you by phone and letter on multiple occasions to contact our office for important blood work associated with the blood thinner, warfarin (Coumadin).  Warfarin is a very important drug that can cause life threatening side effects including, bleeding, and thus requires close laboratory monitoring.  We are unable to accept responsibility for blood thinner-related health problems you may develop because you have not followed our recommendations for appropriate monitoring.  These may include abnormal bleeding occurrences and/or development of blood clots (stroke, heart attack, blood clots in legs or lungs, etc.).  We need for you to contact this office at the number listed above to schedule and complete this very important blood work.  Thank you for your assistance in this urgent matter.  Sincerely,  [facility] Reduction [facility] Team

## 2010-02-06 NOTE — Letter (Signed)
Summary: [facility] - Delinquent Coumadin 2  [facility], Main Office  [address]   [place], [place] [address]   Phone: [phone]  Fax: [phone]     January 29, 2010 MRN: [id number]   [facility] [address] [place], [place]  [address]   Dear Ms. Catino,  We have attempted to contact you by phone and letter on multiple occasions to contact our office for important blood work associated with the blood thinner, warfarin (Coumadin).  Warfarin is a very important drug that can cause life threatening side effects including, bleeding, and thus requires close laboratory monitoring.  We are unable to accept responsibility for blood thinner-related health problems you may develop because you have not followed our recommendations for appropriate monitoring.  These may include abnormal bleeding occurrences and/or development of blood clots (stroke, heart attack, blood clots in legs or lungs, etc.).  We need for you to contact this office at the number listed above to schedule and complete this very important blood work.  Thank you for your assistance in this urgent matter.  Sincerely,  [facility] Reduction [facility] Team

## 2010-02-19 ENCOUNTER — Encounter: Payer: Self-pay | Admitting: Pharmacist

## 2010-02-19 DIAGNOSIS — I4891 Unspecified atrial fibrillation: Secondary | ICD-10-CM

## 2010-03-23 LAB — COMPREHENSIVE METABOLIC PANEL
Alkaline Phosphatase: 70 U/L (ref 39–117)
BUN: 8 mg/dL (ref 6–23)
Calcium: 8.7 mg/dL (ref 8.4–10.5)
Chloride: 102 mEq/L (ref 96–112)
Creatinine, Ser: 0.92 mg/dL (ref 0.4–1.2)
GFR calc Af Amer: >60 mL/min (ref >60)
Potassium: 3.8 mEq/L (ref 3.5–5.1)

## 2010-03-23 LAB — DIFFERENTIAL
Eosinophils Relative: 5 % (ref 0–5)
Neutrophils Relative %: 49 % (ref 43–77)

## 2010-03-23 LAB — URINALYSIS, ROUTINE W REFLEX MICROSCOPIC
Bilirubin Urine: NEGATIVE
Hgb urine dipstick: NEGATIVE
Ketones, ur: NEGATIVE mg/dL
Nitrite: POSITIVE — AB
Specific Gravity, Urine: 1.014 (ref 1.005–1.030)

## 2010-03-23 LAB — CBC
HCT: 38.2 % (ref 36.0–46.0)
MCV: 81.5 fL (ref 78.0–100.0)
WBC: 7.1 10*3/uL (ref 4.0–10.5)

## 2010-03-23 LAB — CK TOTAL AND CKMB (NOT AT ARMC)
CK, MB: 2.6 ng/mL (ref 0.3–4.0)
Relative Index: 1.3 (ref 0.0–2.5)
Total CK: 199 U/L — ABNORMAL HIGH (ref 7–177)

## 2010-03-23 LAB — URINE MICROSCOPIC-ADD ON

## 2010-03-26 LAB — CARDIAC PANEL(CRET KIN+CKTOT+MB+TROPI)
CK, MB: 1.1 ng/mL (ref 0.3–4.0)
CK, MB: 1.5 ng/mL (ref 0.3–4.0)
Relative Index: 0.7 (ref 0.0–2.5)
Relative Index: 0.7 (ref 0.0–2.5)
Relative Index: 0.8 (ref 0.0–2.5)
Total CK: 158 U/L (ref 7–177)
Total CK: 205 U/L — ABNORMAL HIGH (ref 7–177)
Troponin I: 0.02 ng/mL (ref 0.00–0.06)
Troponin I: 0.2 ng/mL — ABNORMAL HIGH (ref 0.00–0.06)

## 2010-03-26 LAB — BASIC METABOLIC PANEL
BUN: 20 mg/dL (ref 6–23)
CO2: 27 mEq/L (ref 19–32)
Calcium: 8.7 mg/dL (ref 8.4–10.5)
Glucose, Bld: 140 mg/dL — ABNORMAL HIGH (ref 70–99)
Sodium: 142 mEq/L (ref 135–145)

## 2010-03-26 LAB — POCT CARDIAC MARKERS
CKMB, poc: 1.9 ng/mL (ref 1.0–8.0)
Troponin i, poc: <0.05 ng/mL (ref 0.00–0.09)

## 2010-03-26 LAB — PROTIME-INR
INR: 1.4 (ref 0.00–1.49)
INR: 1.29 (ref 0.00–1.49)
Prothrombin Time: 17 seconds — ABNORMAL HIGH (ref 11.6–15.2)
Prothrombin Time: 18.6 seconds — ABNORMAL HIGH (ref 11.6–15.2)

## 2010-03-26 LAB — CBC
Hemoglobin: 12.2 g/dL (ref 12.0–15.0)
MCHC: 32.8 g/dL (ref 30.0–36.0)
Platelets: 288 10*3/uL (ref 150–400)
RDW: 15.3 % (ref 11.5–15.5)

## 2010-04-25 ENCOUNTER — Other Ambulatory Visit: Payer: Self-pay | Admitting: Cardiovascular Disease

## 2010-04-29 ENCOUNTER — Ambulatory Visit (INDEPENDENT_AMBULATORY_CARE_PROVIDER_SITE_OTHER): Payer: Medicare Other | Admitting: *Deleted

## 2010-04-29 DIAGNOSIS — I4891 Unspecified atrial fibrillation: Secondary | ICD-10-CM

## 2010-04-29 MED ORDER — WARFARIN SODIUM 5 MG PO TABS: 5.0000 mg | ORAL_TABLET | ORAL | Status: DC

## 2010-05-06 ENCOUNTER — Ambulatory Visit (INDEPENDENT_AMBULATORY_CARE_PROVIDER_SITE_OTHER): Payer: Self-pay | Admitting: *Deleted

## 2010-05-06 ENCOUNTER — Encounter: Payer: Medicare Other | Admitting: *Deleted

## 2010-05-06 DIAGNOSIS — I4891 Unspecified atrial fibrillation: Secondary | ICD-10-CM

## 2010-05-06 LAB — POCT INR: INR: 1.4

## 2010-05-20 ENCOUNTER — Encounter: Payer: Self-pay | Admitting: *Deleted

## 2010-05-20 NOTE — Consult Note (Signed)
NAMEJALEEN, [name]              ACCOUNT NO.:  [id number]   MEDICAL RECORD NO.:  [id number]          PATIENT TYPE:  INP   LOCATION:  2033                         FACILITY:  [facility]   PHYSICIAN:  [name]. Eden Emms, MD, FACCDATE OF BIRTH:  [birth year]   DATE OF CONSULTATION:  02/01/2007  DATE OF DISCHARGE:                                 CONSULTATION   HISTORY OF PRESENT ILLNESS:  Erin [name] returns today for follow-up.   Unfortunately she has let herself go a little bit. She has been fairly  sick lately.   The patient has a history of nonischemic cardiomyopathy with EF as low  as 25%.  She did have a heart cath in August 2003 which had a 50%  stenosis in the origin of a first diagonal branch and no other  significant disease.  At the time her EF was 15% with diffuse  hypokinesis.   She also had a right heart cath at that time which showed a PA pressure  of 62/35.   She sees Dr. Shelle Iron in our pulmonary division regularly.  She has  significant sleep apnea.  She has not been using her CPAP because of  mechanical malfunction recently.  Over the last 2 weeks she has had  increasing shortness of breath.  She has had increasing lower extremity  edema.  She has had some pressure in her chest with her dyspnea.  She  was quite dyspneic when I saw her in the office.  She also apparently  was in rapid atrial fibrillation.  This was documented by EKG.  I was a  little bit concerned about [name].  She does not have a lot of leeway in  regards to combined cardiopulmonary disease.  She clearly has had signs  of worsening heart failure in regards to her dyspnea and lower extremity  edema and I do not think she is tolerating her rapid AFib well. After  discussion, we made a decision to admit her to the hospital.   REVIEW OF SYSTEMS:  Remarkable for no significant sputum or fever.  She  has been coughing. She has had increasing lower extremity edema and  pressure in her chest which is new.  She has been  compliant with her  medications.   ALLERGIES:  She has no known allergies.   CURRENT MEDICATIONS:  Her current medications include:  1. An aspirin a day  2. Lasix 40 b.i.d.  3. Cozaar 100 a day.  4. Potassium 20 t.i.d.  5. Hydralazine 25 q 6 hours.  6. Imdur 30 a day.  7. Lopressor 100 b.i.d.  8. Home O2 2 liters and CPAP.   She has no contraindications to Coumadin.   FAMILY HISTORY:  Noncontributory.   PAST MEDICAL HISTORY:  Remarkable for significant hypertension, previous  congestive heart failure in WARCEF trial, 50% diagonal disease but no  critical coronary artery disease, hyperlipidemia.   SOCIAL HISTORY:  The patient lives in [place].  Her activities are  limited by her significant cardiopulmonary disease.  She did drive  herself here today.  She does not smoke or drink.  I did  speak with her  daughter Erin [name] on the phone today to inform her of her mother's  hospitalization.   PHYSICAL EXAMINATION:  GENERAL:  The patient's exam is remarkable for a  short statured overweight black female with obvious dyspnea.  VITAL SIGNS:  Blood pressure is elevated at 167/110, pulse is 130 and  irregular, weight is 219, afebrile.  HEENT:  Unremarkable.  Carotids normal without bruit.  JVP is not  visible.  LUNGS:  Have inspiratory crackles at the base.  HEART:  There is an S1-S2 with distant heart sounds.  PMI is not  palpable.  There is no obvious RV heave.  ABDOMEN:  Protuberant.  No hepatosplenomegaly, hepatic reflux.  No  tenderness.  No AAA or bruit.  EXTREMITIES:  Distal pulses are intact with +1 to 2 edema bilaterally.  NEUROLOGIC:  Nonfocal.  SKIN:  Warm and dry.   EKG shows rapid atrial fibrillation at rate of 131.   IMPRESSION:  1. New-onset atrial fibrillation.  The patient's Lopressor to be      spread out to 50 q. Six.  IV amiodarone to be begun.  In hospital      she will be started on heparin and eventually Coumadin. Additional      rate control with  digoxin will be in order given her history      decreased LV function.  2. CHF. History of nonischemic cardiomyopathy. Last echo showed that      her EF was in the 45% range.  She will be referred for right and      left heart catheterization prior to dealing with her AFib. I think      this is important in regards to antiarrhythmic therapy in the      future.  3. Sleep apnea with significant lung disease.  She will have CPAP in      the hospital.  She is normally on 2 liters of O2 at home.  I will      have her pulmonary doctor see her to see if there is anything else      we can do to optimize her therapy.  She will have a BMP done to      assess her filling pressures.  4. History of pulmonary hypertension in the setting of sleep apnea.      She will have a right heart cath to assess to see if she is a      candidate for Revatio or other pulmonary vasodilator drugs.  5. Hypertension.  Continue hydralazine 25 q. 6, Lasix to be changed to      IV b.i.d. to help with lower extremity edema.  We may add Cardizem.      Given her history of decreased LV function I prefer to hold on      this.   Shreya will be in the hospital for a while. We need to define her  coronary anatomy in the setting of pressure, make sure she does not have  severe pulmonary hypertension and is not a candidate for Revatio and  then deal with issues of long-term anticoagulation and cardioversion.   This was all discussed with Amil Amen and she is consenting to be admitted  to the hospital.      Theron Arista C. Eden Emms, MD, [facility]  Electronically Signed     PCN/MEDQ  D:  02/01/2007  T:  02/02/2007  Job:  [id number]

## 2010-05-20 NOTE — Assessment & Plan Note (Signed)
[facility] HEALTHCARE                            CARDIOLOGY OFFICE NOTE   DANNAH, RYLES                       MRN:          [id number]  DATE:03/29/2007                            DOB:          [birth year]    Erin [name] returns today for followup.  She has Class IV heart failure with  pulmonary hypertension.  She was just hospitalized for diuresis.   Dr. Graciela Husbands indicated that he did not want to put a BiV AICD in her  currently in regards to seeing how she does with her Class IV symptoms.  She has a nonischemic cardiomyopathy.   The patient has improved since discharge.  However, she is still not  probably taking her medicines on an accurate basis.  Her weight has been  stable at about 190.  She is supposed to be on Viagra in light of not  being able to afford Revatio, but we aer still trying to get this taken  care of financially. Her Cozaar, unfortunately, is only 50 mg. It had  been 100 mg, and she is not on hydralazine the way she is supposed to  be. Otherwise, she is not having chest pain, syncope, PND, orthopnea.  Her lower extremity edema is markedly improved, and her dyspnea is  significantly improved.  There is been no cough, PND, orthopnea.  She is  able to sleep in bed.   REVIEW OF SYSTEMS:  Otherwise negative.   MEDICATIONS:  1. Aspirin a day.  2. Lasix 60 b.i.d.  3. Cozaar supposed to be 100 a day.  4. Hydralazine to be 50 q. 8 h, no on currently.  5. Viagra not on currently.  Trying to get financial support for her.  6. Lopressor 100.  7. Coreg, 12.5 b.i.d.  8. Warfarin as directed.  9. Pacerone 200.  10.Potassium 20 a day.   PHYSICAL EXAMINATION:  GENERAL:  Remarkable for a short-statured, large-  busted black female in no distress.  VITAL SIGNS:  She is in sinus rhythm at a rate of 90. Blood pressure is  elevated 180/80, afebrile, respiratory rate 14.  HEENT:  Unremarkable.  NECK:  Carotids are normal without bruit. No  lymphadenopathy,  thyromegaly, or JVP elevation.  LUNGS:  Clear with good diaphragmatic motion.  No wheezing.  CARDIAC:  S1-S2, distant heart sounds. PMI not palpable.  ABDOMEN:  Protuberant.  Bowel sounds positive.  No AAA. No tenderness.  No  hepatosplenomegaly or hepatojugular reflux.  EXTREMITIES:  Distal pulses intact. No edema.  NEUROLOGIC:  Nonfocal.  SKIN:  Warm and dry.  MUSCULOSKELETAL:  No muscular weakness.   IMPRESSION:  1. Congestive heart failure, primarily Class IV nonischemic.  Continue      current medications, increasing doses of Cozaar and hydralazine.  2. Pulmonary hypertension.  Continue anticoagulation.  I will have to      check to see if she qualifies for home O2. Trying to get      vasodilator Viagra in lieu of Revatio.  3. Paroxysmal atrial fibrillation.  Maintain Pacerone current dose,      maintaining sinus rhythm.  Continue Coumadin.  Explained to Erin [name]      the importance of daily weights.  She seems to have a dry weight of      190 at home.  I also explained to her the importance of getting her      medications straightened out, and Stanton Kidney will help her with this      today.   I will see her back in 4-5 weeks to reassess her blood pressure.     [name]. Eden Emms, MD, [facility]  Electronically Signed    PCN/MedQ  DD: 03/29/2007  DT: 03/29/2007  Job #: [id number]

## 2010-05-20 NOTE — Assessment & Plan Note (Signed)
[facility] HEALTHCARE                            CARDIOLOGY OFFICE NOTE   MALEAHA, Erin                       MRN:          [id number]  DATE:02/24/2007                            DOB:          20-Nov-1945    Erin [name] was here today in followup.  The last time I saw her in the office  was  a few weeks ago.  She was in florid congestive heart failure with  rapid atrial fibrillation.  We admitted her to the hospital and diuresed  her.  She had heart catheterization which only showed moderate LAD  disease.  Her EF was 15%.  She has severe pulmonary hypertension.  Overall long-term prognosis was thought to be poor.  I had Dr. Graciela Husbands see  her in regards to nonsustained VT and need for defibrillator.  He felt  that her long-term prognosis from her heart failure and pulmonary  hypertension were such that a defibrillator was not indicated.  This  will need to be revisited in the future.  Since discharge the patient is  doing well.  She has less shortness of breath and left ankle edema.  She  denies any  significant chest pain.  She needs to get her Coumadin  checked today.   She converted in the hospital on amiodarone and continues to take this.  There has been some confusion with her medications.  She needs get her  Lasix refilled.  She had her dose increased to 60 b.i.d.  We had stopped  her Imdur and wanted her on Viagra 25 q.8 in lieu of her pulmonary  hypertension.  This is much cheaper than Revatio   I used [facility] today to call these in to the [address] on [address].   Review of systems otherwise negative.   CURRENT MEDICATIONS:  1. Aspirin 1 a day  2. Lasix 60 b.i.d.  3. Cozaar 50 a day  4. Potassium 20 t.i.d.  5. Hydralazine 25 q.8.  6. Imdur was stopped  7. Lopressor 100 b.i.d.  8. Coreg 12.5 b.i.d.  9. Warfarin as directed  10.Pacerone 200 q.8  11.Potassium   PHYSICAL EXAMINATION:  Her exam is remarkable for a short  stature,  [place] black female in no distress.  Weight is 199 which is down 20  pounds from her last office visit on February 01, 2007, blood pressure is  150/80, pulse 73 and regular, afebrile.  HEENT:  Unremarkable.  NECK:  Carotids normal, without bruit, no lymphadenopathy, thyromegaly.  JVP is elevated but difficult to see.  LUNGS are clear, good diaphragmatic motion.  No wheezing.  CARDIAC:  S1-S2, distant heart sounds, PMI not palpable.  ABDOMEN:  Benign.  Bowel sounds positive.  No hepatosplenomegaly.  EXTREMITIES:  Good reflexes, no tenderness, no bruit, distal pulses  intact.  She has no edema today.   IMPRESSION:  1. Congestive heart failure much improved since hospitalization,      weight down 20 pounds, no edema.  Continue current doses of      diuretic and potassium.  2. Pulmonary hypertension.  Need to start  back on Viagra 25 q.8.  I      think this will help with her blood pressure as well.  We are      stopping her Imdur in lieu of this.  3. Coronary disease, moderate LAD disease, no angina.  Continue      aspirin and beta blocker therapy.  4. PAF.  Continue Coumadin, watch INR closely since she has been      started on amiodarone. currently maintaining sinus rhythm.  Rate      control has been good with current dose of Coreg.   I will see her back in about 4 weeks.  At that point will check her BMET  and BNP again.     [name]. Eden Emms, MD, [facility]  Electronically Signed    PCN/MedQ  DD: 02/24/2007  DT: 02/25/2007  Job #: [id number]

## 2010-05-20 NOTE — Consult Note (Signed)
[name], [name]              ACCOUNT NO.:  [id number]   MEDICAL RECORD NO.:  [id number]          PATIENT TYPE:  INP   LOCATION:  2917                         FACILITY:  [facility]   PHYSICIAN:  Duke Salvia, MD, FACCDATE OF BIRTH:  [birth year]   DATE OF CONSULTATION:  02/03/2007  DATE OF DISCHARGE:                                 CONSULTATION   Thank you much for asking [place] to see Erin [name] in consultation  because of congestive heart failure and nonischemic cardiomyopathy.  This is for consideration of ICD implantation.   [name] is a 65 year old morbidly obese African American woman  with a history of oxygen-dependent COPD.  Ejection fraction is measured  in the 15%-30% range over the last 6 years variably, who at baseline is  able to walk about 20 feet.  Over the last couple of weeks, she has had  progressive shortness of breath so that she had become dyspneic at rest.  She presented to Dr. Eden Emms in the office and was found to be in atrial  tachycardia, with a rapid ventricular response; she had no associated  palpitations.  There has been progressive peripheral edema, progressive  rest dyspnea.   She has had no history of antecedent atrial fibrillation.  She has no  history of syncope.   She underwent catheterization yesterday by Dr. Shawnie Pons  demonstrating a 70%-8% proximal LAD lesion, with 2+ mitral  regurgitation.  The ejection fraction was less than 15%.  The RA  pressure was in the 30 range.  Cardiac indexes were the 1.2-1.5 range.   PAST MEDICAL HISTORY:  1. Notable primarily as above.  2. She also has sleep apnea, for which she sometimes wears a mask, but      this has not been working recently.  3. She has dyslipidemia.  4. Hypertension.   SOCIAL HISTORY:  She lives in [place].  She lives with her daughter.  She does not use alcohol or recreational drugs.   MEDICATIONS ON ADMISSION:  Lasix, Cozaar hydralazine, Imdur, Lopressor,  now  switched to Coreg, and home O2.   She has no known drug allergies.   REVIEW OF SYSTEMS:  Is noted on the intake sheet, and is notable for  some problems with chills, recent increasing weight gain, and nocturia.   PHYSICAL EXAMINATION:  GENERAL:  She is an older [place] American  female, appearing older than her stated age of 65.  VITAL SIGNS:  Her blood pressure is 102/67, her respirations were 16,  her heart pulse was 70.  HEENT:  Demonstrated no icterus or xanthomata.  NECK:  Neck veins were not discernible.  Carotids were brisk.  There was  a bruit on the right.  BACK:  Without kyphosis or scoliosis or bibasilar crackles.  HEART:  Sounds were regular. without appreciable murmur.  ABDOMEN:  Soft, with active bowel sounds.  EXTREMITIES:  There is 2+ peripheral edema.  NEUROLOGIC:  Grossly normal.  SKIN:  Warm and dry.   I should note that her weight is about 220 pounds, and she looks to be  about 5 feet tall.  Electrocardiogram in the office demonstrated a supraventricular  tachycardia, with discernible P-waves.  The P-wave axis was negative in  V1.  It was upright in aVL and upright in V1.  QRS duration was narrow.   Catheterization data is recounted to include the RA pressure of 28, the  cardiac output of 1.2 by Fick and by thermodilution.  Pulmonary systolic  pressure was 62, with a capillary wedge pressure of 37 or so.   IMPRESSION:  1. Class IIIB-IV acute on chronic systolic congestive heart failure.  2. Nonischemic cardiomyopathy, with 2+ mitral regurgitation.  3. Narrow QRS.  4. Atrial tachycardia, with a rapid ventricular response, potentially      contributing to #1 and #2.  5. Oxygen-dependent chronic obstructive pulmonary disease.  6. Morbid obesity.  7. Obstructive sleep apnea.   DISCUSSION:  [name] has severe congestive heart failure, severe  biventricular volume and pressure overload, and moderate mitral  regurgitation.  Her QRS is normal, which  means that the opportunity for  resynchronization is not available.  Her comorbidities are huge, and  notwithstanding the fact that she has survived the last number of years,  her one-year mortality must be very high, notwithstanding her medical  therapy.  In this patient who has class IV heart failure and at best  IIIB heart failure, I think that it would be of value to try to  aggressively treat her CHF and see if we can improve her status.  Because of a class IV cohort, ICD implantation may not be of  demonstrable benefit.   Her atrial arrhythmia may be contributing to her deterioration.  Amiodarone may be useful, notwithstanding her lung disease, particularly  if the atrial tachycardia recurrence.  I do not think that she needs  Coumadin for her atrial arrhythmia, although there are data that it  might be useful for her pulmonary attention and her LV dysfunction.   RECOMMENDATIONS:  Based on the above, we will therefore:  1. Continue aggressive management of her congestive heart failure.  2. I can consider subsequent implantation of an ICD, although I am      somewhat reluctant, given her current functional status, to pursue      that.   Thank you very much for the consultation.      Duke Salvia, MD, [facility]  Electronically Signed     SCK/MEDQ  D:  02/03/2007  T:  02/04/2007  Job:  [id number]   cc:   [name]. Eden Emms, MD, [facility]  [facility]

## 2010-05-20 NOTE — Cardiovascular Report (Signed)
Erin [name], [name]              ACCOUNT NO.:  [id number]   MEDICAL RECORD NO.:  [id number]          PATIENT TYPE:  INP   LOCATION:  2033                         FACILITY:  [facility]   PHYSICIAN:  Arturo Morton. Riley Kill, MD, FACCDATE OF BIRTH:  1945/04/01   DATE OF PROCEDURE:  02/02/2007  DATE OF DISCHARGE:                            CARDIAC CATHETERIZATION   INDICATIONS:  See is a 65 year old who presents with congestive heart  failure.  She has been followed by Dr. Eden Emms for a number of years.  The current study was done to assess both coronary anatomy and LV  function as well as right heart pressures.   PROCEDURES:  1. Left and right heart catheterization.  2. Selective coronary arteriography.  3. Selective left ventriculography.   DESCRIPTION OF PROCEDURE:  The patient was brought to the  catheterization laboratory, prepped and draped in the usual fashion.  We  assessed the patient's airway.  We did a time-out.  Through an anterior  puncture, the right femoral vein was entered.  A 7-French sheath was  placed.  Superior vena cava saturation was then obtained using a Swan-  Ganz catheter.  Serial right heart pressures were then performed without  difficulty.  Pulmonary artery saturation was obtained.  Thermodilution  cardiac outputs were performed.  Following this, using a Smart needle  again, the femoral artery was entered.  A 6-French sheath was placed.  Simultaneous wedge LV was obtained.  Saturations were obtained.  The  right heart catheter was then pulled back so we could get an accurate  right atrial pressure at the same time of LVEDP.  The right heart  catheter was then removed.  Ventriculography was then performed in the  RAO projection using 24 mL contrast at a flow rate of 12 mL per second.  Following this, standard Judkins catheters were utilized to perform  coronary arteriography.  She tolerated all of this well.  We did give  her some intravenous Lasix because of a  markedly elevated LVEDP.  I  spoke with Dr. Eden Emms regarding the results.   She was taken to the holding area in satisfactory clinical condition.  ACTs were checked at the completion of the procedure.   HEMODYNAMIC DATA:  1. Central aortic pressure 109/81, mean 93.  2. Left ventricular pressure 106/38.  3. Pulmonary capillary wedge 37 - 41.  4. Pulmonary artery of 62/40.  5. RV 62/30.  6. RA 28/30.  7. Fick cardiac output 2.3 liters per minute.  8. Fick cardiac index 1.2 liters per minute per meter squared.  9. Thermodilution cardiac output 2.85 liters per minute.  10.Thermodilution cardiac index 1.5 liters per minute per meter      squared.  11.Superior vena cava saturation 43%.  12.Pulmonary saturation 35%.  13.Aortic saturation 97%.   ANGIOGRAPHIC DATA:  1. Ventriculography was done in the RAO projection.  The LV was      dilated and poorly contractile.  There was global hypokinesis.      Ejection fraction was felt to be less than 15%.  There was at least      moderate mitral  regurgitation, which appeared to be central      although it was difficult to tell because of the size of the      ventricle.  2. The left main is free of critical disease.  3. The LAD has a segmental area of plaquing just at the diagonal      takeoff.  This is best visualized in the LAO caudal view.  The      diagonal proper has about 40% narrowing.  The LAD wraps the apex.  4. The circumflex is a moderate-sized vessel with some luminal      irregularity in the midportion.  5. The right coronary artery is a moderate-sized vessel with a      posterior descending and posterolateral branch with minor luminal      irregularity but no significant focal stenosis.   CONCLUSIONS:  1. Severe nonischemic cardiomyopathy.  2. Moderate left anterior descending disease.  3. History of sleep apnea.   PLAN:  Dr. Eden Emms would like a pulmonary consult, which appears to be  appropriate.  We will try to diurese the  patient.  Dr. Eden Emms will  review the films and further decisions made.      Arturo Morton. Riley Kill, MD, [facility]  Electronically Signed     TDS/MEDQ  D:  02/02/2007  T:  02/02/2007  Job:  [id number]

## 2010-05-20 NOTE — Discharge Summary (Signed)
NAMEUGOCHI, HENZLER              ACCOUNT NO.:  [id number]   MEDICAL RECORD NO.:  [id number]          PATIENT TYPE:  INP   LOCATION:  3734                         FACILITY:  [facility]   PHYSICIAN:  Joellyn Rued, PA-C     DATE OF BIRTH:  1945-02-18   DATE OF ADMISSION:  02/01/2007  DATE OF DISCHARGE:  02/11/2007                         DISCHARGE SUMMARY - REFERRING   DISCHARGING PHYSICIAN:  Dr. Eden Emms (it is noted that in the computer and  all the stickers on the chart, it is stated that Dr. Elease Hashimoto is the  admitting physician; however, this is incorrect).   BRIEF HISTORY:  Ms. Katayama is a 65 year old African American female  with morbid obesity, O2 dependent COPD, as well as a cardiomyopathy with  EF of 15-30% range.  Over the preceding several weeks, she has had  progressive shortness of breath.  She presented to Dr. Eden Emms in the  office and was found to be in atrial tachycardia with a rapid  ventricular rate.  Thus her admission.  Catheterization was performed on  January 28 which showed a 70-88% proximal LAD with 2+ MR.  EF less than  15%.  Home O2 with CPAP dependency, dyslipidemia, hypertension is her  significant medical history.  Chest x-ray on the January  27 showed  cardiomegaly without definite failure, bilateral lung densities that are  chronic.  No acute processes.  Admission H&H was 14.5 and 45.1, normal  indices, platelets 369, WBCs 9.8.  Prior to discharge H&H was 12.7 and  39.3.  MCV was slightly low at 77.5, platelets 334, WBCs 9.6.  On  January 28, ABG showed a pH of 3.07 with a pCO2 of 47, a pO2 of 87, on  saturation 97%.  Admission PTT was 24, PT 13.8.  At the time of  discharge, PT was 19.5 and INR 1.6.  Admission sodium was 134, potassium  4.5, BUN 21, creatinine 1.04, glucose 110.  On January 29, BUN and  creatinine had an increase to 29 and 1.53.  LFTs were also drawn with  this and showed AST of 38, albumin and protein were slightly low at 5.7  and 3.1.  BUN  increased to a maximum of 31 on February 1.  At the time  of discharge on February 6, sodium was 134, potassium 4.3, BUN 15,  creatinine 1.26, glucose 88.  At the time of admission, BNP was 1393 and  704 at the time of discharge.  TSH on January 29 was 1.698.  EKGs showed  normal sinus rhythm, sinus tachycardia, atrial fibrillation.  No acute  changes.   HOSPITAL COURSE:  Ms. Wendell was admitted to [facility] by  Dr. Maurine Cane.  She was placed on IV heparin.  Pharmacy assisted with  management.  Respiratory assisted with management of O2 and CPAP.  By  January 28, her shortness of breath had improved slightly and she  converted to normal sinus rhythm on Amiodarone.  Dr. Eden Emms changed her  beta blocker to Coreg and started Coumadin.  Left and right heart cath  was performed on February 02, 2007, by Dr. Riley Kill.  EF was less than 15%  with moderate MR, 40% proximal diagonal 1 , 70-80% proximal LAD,  irregularities in the circumflex.  Dr. Riley Kill felt that she had a  severe nonischemic cardiomyopathy with moderate LAD disease.  Dr. Eden Emms  asked Dr. Graciela Husbands to see in regards to possible bi-V pacer/AICD.  EP  consult was obtained on January 29 with Loura Pardon, PA-C and Dr. Graciela Husbands.  Dr. Graciela Husbands felt that her major comorbidities may offset benefit of ICD as  well as her very poor functional capacity.  He felt that attempt should  be made to reverse CHF situation before making a final decision on  implantation.  He also felt that her atrial dysrhythmias could be  contributing to her deterioration..  Over the next several days, the  patient's medications continued to be adjusted in regards to her  congestive heart failure.  Chaplin paid visits with her to provide  encouragement.  Physical therapy and cardiac rehab also assisted with  activity progression.  Nurse assisted with discharge needs.  Her  activity gradually increased.  Dr. Graciela Husbands again met with the patient and  family and felt  that overall ICD should be deferred for now.  Pharmacy  assisted with anticoagulation.  Dr. Eden Emms placed the patient on Cipro  for left lower leg infection and recommended wound care consult.  This  was also performed.  During her admission, it was noted that there was  talk of pulmonary consultation.  However, the patient had not been seen  by pulmonary during this admission.  She was walking in the hall without  difficulty.  Dr. Dietrich Pates noted on February 4.  He felt that we should  also consider hydralazine and aldactone for continued treatment.  By the  February 5, her lower extremity cellulitis had improved and on February  6, Dr. Eden Emms felt that the patient could be discharged home.   DISCHARGE DIAGNOSIS:  1. Acute on chronic systolic congestive heart failure.  2. Nonischemic cardiomyopathy.  3. Paroxysmal atrial fibrillation.  4. Amiodarone initiation.  5. Anticoagulation initiation.  6. O2 dependent COPD.  7. Obesity.  8. Obstructive sleep apnea requiring CPAP.  9. Iron deficiency anemia.  10.History as noted previously.   PROCEDURES PERFORMED:  Cardiac catheterization on February 03, 2007, by  Dr. Riley Kill as previously described.  Please list discharge diagnoses  and procedures first.   DISPOSITION:  The patient is discharged home.  She was asked to maintain  a low-sodium heart-healthy diet.  Activities are not restricted.   MEDICATIONS:  1. Coumadin 5 mg daily except for 7.5 mg on Tuesday and Thursday.  2. Lasix was increased to 60 mg every 8 hours.  3. Cozaar was decreased to 50 mg daily.  4. Lopressor was changed to Coreg 12.5 mg b.i.d.  5. Amiodarone was initiated at 200 mg every 8 hours.  6. She received new prescription as well for potassium 20 mEq daily.  7. Aspirin 81 mg daily.  8. Imdur 30 mg daily.  9. Cipro 258 mg b.i.d. for 5 days.  10.She was asked to continue her home O2 and CPAP as previously.   DISCHARGE INSTRUCTIONS:  She will have a PT/INR in the  office on  Wednesday February 16, 2007, 8:45.  She is asked to bring all medicines  and all weights to all appointments.  She will follow up with Dr. Eden Emms  on February 24, 2007 at 2:45 p.m.   DISCHARGE TIME:  Fifty minutes.  Joellyn Rued, PA-C     EW/MEDQ  D:  02/11/2007  T:  02/11/2007  Job:  [id number]   cc:   Duke Salvia, MD, [facility]  [name]. Eden Emms, MD, [facility]  [facility]

## 2010-05-20 NOTE — Assessment & Plan Note (Signed)
[facility]                                 ON-CALL NOTE   HAIDEN, CLUCAS                       MRN:          [id number]  DATE:02/12/2007                            DOB:          08/10/1945    PRIMARY CARDIOLOGIST:  Dr. Eden Emms.   HISTORY:  Ms. Secord is a 65 year old female who was discharge from  the hospital on February 11, 2007.  She called stating that she has a  question about one of her discharge medications.  She states that on the  pink sheet that she was discharged home on a medication called Imdur 30  mg daily.  However, she states that she has not have this medication at  home.   On review of echart, it is  my understanding that the patient was on  this medication prior to admission and that this is not to be a new  medication for her.  However, on discussing with Ms. Rack she states  that she cannot find this medication at home.  She threw multiple pill  bottles out last night and had removed their labels. After review with  Dr. Eden Emms, Dr. Eden Emms suggested that she not take the Imdur that she  would be okay without it. When he sees her in the office, he will be  reviewing her medications and making further adjustments. Dr. Eden Emms  plans are to start Revatio or Viagra for her pulmonary hypertension.  I  have asked Ms. Vankleeck to please bring all medicines and her bottles  into the office for her appointment that we might be able to review  these and help her straighten out her home medications.      Joellyn Rued, PA-C  Electronically Signed      [name]. Eden Emms, MD, [facility]  Electronically Signed   EW/MedQ  DD: 02/12/2007  DT: 02/14/2007  Job #: [id number]

## 2010-05-20 NOTE — Assessment & Plan Note (Signed)
[facility]                            CARDIOLOGY OFFICE NOTE   Erin [name], Erin [name]                       MRN:          [id number]  DATE:04/26/2007                            DOB:          January 27, 1945    Erin [name] returns today for follow-up.  She has had paroxysmal atrial  fibrillation with congestive heart failure and EF of 15% with severe  pulmonary hypertension.   We had talked to Dr. Graciela Husbands in the past regarding a BiV AICD and he did  not think she was a candidate for it.   She has been maintained on Coumadin.  She thinks her Coumadin is a  little thin as she has been bleeding bit excessively when she scratches  herself.   We have tried to get her on Revatio or Viagra, but they have both been  too expensive.  We are looking into assistance.  For the time being, she  has been taking Isordil 10 t.i.d.   Her other meds are stable.  She weighs herself daily in the morning and  weighs about 200 pounds.  She has not had any palpitations, chest pain,  PND, she thinks her lower extremity edema is gone.  She is currently  functional class II.   REVIEW OF SYSTEMS:  Remarkable for no significant syncope.  No chest  pain and mild chronic exertional dyspnea.   CURRENT MEDICATION:  1. Aspirin a day.  2. Lasix 60 t.i.d.  3. Potassium 20 t.i.d.  4. Cozaar 100 a day.  5. Hydralazine 50 t.i.d.  6. Isordil 10 t.i.d.  7. Coreg 12.5 b.i.d.  8. Warfarin as directed.  9. Pacerone 200 q.8h.   PHYSICAL EXAMINATION:  Remarkable for  GENERAL APPEARANCE:  A buxom black female in no distress.  VITAL SIGNS:  Weight is up on our scales 4 pounds from 204 to 208, blood  pressure is 150/85, pulses 80 and regular, afebrile, respiratory rate  16.  HEENT:  Unremarkable.  NECK:  Carotids are normal without bruit, no lymphadenopathy,  thyromegaly or JVP elevation.  LUNGS:  Clear with good diaphragmatic motion.  No wheezing.  CARDIOVASCULAR:  S1-S2 distant heart sounds,  PMI not palpable.  ABDOMEN:  Protuberant.  Bowel sounds positive, no AAA, no tenderness.  No hepatosplenomegaly or hepatojugular reflux.  No bruit.  EXTREMITIES:  Distal pulses are intact, no edema.  NEURO:  Nonfocal.  SKIN:  Warm and dry.  MUSCULOSKELETAL:  No muscular weakness.   IMPRESSION:  1. Congestive heart failure, ejection fraction 15%, currently      functional class II.  Instructed to take an extra Lasix or      potassium if her weight goes up 4 pounds.  We will check a BMET and      BNP today, continue current dose of Lasix t.i.d. with potassium      supplementation.  Will consider adding Aldactone in the near      future.  2. Paroxysmal atrial fibrillation, currently maintaining sinus rhythm.      Continue warfarin and Pacerone.  Will need pulmonary function      tests,  liver function tests and thyroid function studies in about      three months when I see her.  To see the Coumadin clinic today.  3. Question excessive bleeding on Coumadin.  Will probably have an INR      checked through an intravenous stick since she needs a BMET and BNP      today.  Possibility for high INR given amiodarone therapy.  4. Pulmonary hypertension.  We will see if Revatio comes through with      any assistance.  Otherwise, we will continue Isordil 10 t.i.d.   Overall, Erin [name] is doing well.  I think so long as she maintains sinus  rhythm we can keep her on the hospital.     Theron Arista C. Eden Emms, MD, [facility]  Electronically Signed    PCN/MedQ  DD: 04/26/2007  DT: 04/26/2007  Job #: [id number]

## 2010-05-21 ENCOUNTER — Ambulatory Visit (INDEPENDENT_AMBULATORY_CARE_PROVIDER_SITE_OTHER): Payer: Medicare Other | Admitting: *Deleted

## 2010-05-21 DIAGNOSIS — I4891 Unspecified atrial fibrillation: Secondary | ICD-10-CM

## 2010-05-21 LAB — POCT INR: INR: 3

## 2010-05-23 NOTE — Assessment & Plan Note (Signed)
[facility]                              CARDIOLOGY OFFICE NOTE   Erin [name], Erin [name]                       MRN:          [id number]  DATE:09/15/2005                            DOB:          Apr 13, 1945    Erin [name] is seen today in followup.  She has had nonischemic  cardiomyopathy, her EF is improved.  Recent echo showed an improvement from  20% to 45%.  She had no apical mural thrombus.  She continues to have  significant sleep apnea, and is on oxygen at night.  She needs to follow up  with her primary care MD in [facility].  She also needs further followup  with Dr. Shelle Iron.  From a cardiac perspective, she is stable.  At the end of  July her BNP was in the 200 range.  We have adjusted her hydralazine, Imdur  and Lasix.   She has no lower extremity edema, and seems to be sleeping better.  She has  had a previous cath, and as I recall may have had a 40% to 50% LAD lesion,  but she was thought to have a nonischemic cardiomyopathy.   PHYSICAL EXAMINATION:  She is overweight and heavy-chested.  The blood  pressure is 130/70, pulse is 80 and regular.  LUNGS:  Clear.  CAROTIDS:  Normal.  HEART: There is an S1, S2, distant heart sounds.  ABDOMEN:  Benign.  LOWER EXTREMITIES:  Intact pulses, no edema.   IMPRESSION:  Stable presumed nonischemic cardiomyopathy with improvement in  ejection fraction.  She will continue her current medications.  She is on  both Cozaar and hydralazine, Imdur, for afterload reduction.  She is on her  Lasix 40 mg b.i.d.  She also is taking Lopressor 100 mg b.i.d.  She will  follow with Dr. Shelle Iron for a prescription for her home O2 and sleep apnea.  She will see her primary care doctor for regular exams.  It was encouraging  to see her ejection fraction improve, and this will need to be monitored  every year.                               Erin [name]. Erin Emms, MD, [facility]    PCN/MedQ  DD:  09/15/2005  DT:  09/16/2005  Job #:  [id number]

## 2010-05-23 NOTE — Discharge Summary (Signed)
NAMELASHON, Erin [name]              ACCOUNT NO.:  [id number]   MEDICAL RECORD NO.:  [id number]          PATIENT TYPE:  INP   LOCATION:  4735                         FACILITY:  [facility]   PHYSICIAN:  Jesse Sans. Wall, M.D.   DATE OF BIRTH:  06-06-1945   DATE OF ADMISSION:  07/12/2005  DATE OF DISCHARGE:  07/16/2005                                 DISCHARGE SUMMARY   PROCEDURE:  2-D echocardiogram.   PRIMARY DIAGNOSIS:  Congestive heart failure.   SECONDARY DIAGNOSES:  1.  Nonischemic cardiomyopathy with an ejection fraction of 10-20% by      echocardiogram this admission.  2.  Minimal pericardial effusion without hemodynamic compromise seen on      echocardiogram this admission as well as mild mitral valvular      regurgitation and mild left atrial dilatation with right ventricular      systolic function mildly reduced.  3.  Hypercholesterolemia.  4.  Obstructive sleep apnea on CPAP.  5.  Allergy or intolerance to ACE inhibitors.  6.  Status post hysterectomy.  7.  Family history of coronary artery disease.  8.  Status post cardiac catheterization in 2003 with nonobstructive disease.  9.  Borderline renal insufficiency with a BUN of 22 and creatinine of 1.5 at      discharge.  10. Hypokalemia.  Potassium supplementation.   TIME AT DISCHARGE:  36 minutes.   HOSPITAL COURSE:  Erin [name] is a 65 year old female with known congestive  heart failure.  She had worsening orthopnea as well as PND and chest  heaviness.  She came to the emergency room and was admitted for further  evaluation and treatment.   Erin [name] had stopped her Lasix for several days because she was  traveling.  She was started on IV Lasix and diuresed.  As she diuresed, her  shortness of breath improved.  Her BNP was 1196 on admission and dropped to  554 by discharge.  Her weight was 217 on admission and dropped to 206 prior  to discharge.  As her weight decreased and she diuresed, her shortness of  breath  improved.  A 2-D echocardiogram was performed with results as  described above.  She had hydralazine and Imdur added to her medication  regimen.  She also had home O2 because her sats were less than 93%.  She was  continued on the CPAP that she had been using prior to admission.  A repeat  chest x-ray showed slightly increased atelectasis at the right base and some  venous hypertension with an interstitial prominence.  Case manager consult  was called for home O2 and she was also set up with a home health RN.   By July 16, 2005, her weight was 207 pounds.  She was ambulating without  chest pain or shortness of breath.  Her O2 saturation on 2 liters was 95%.  She was evaluated by Dr. Daleen Squibb and considered stable for discharge with  outpatient follow-up arranged.   DISCHARGE INSTRUCTIONS:  1.  Her activity level is to be increased gradually.  2.  She is to bring all medications  to the office.  3.  She is to weigh herself daily.  4.  She is to follow up with Dr. Eden Emms on July 30, 2005, at 10:45 and with      family practice and scheduled.   DISCHARGE MEDICATIONS:  1.  Aspirin 81 mg a day.  2.  Cozaar 100 mg a day.  3.  Lasix 40 mg b.i.d.  4.  K-Dur 20 mEq t.i.d.  5.  Hydralazine 25 mg q.6h.  6.  Imdur 30 mg a day.  7.  Metoprolol 100 mg b.i.d.  8.  Home O2 at 2 liters continuous.      Theodore Demark, P.A. [facility]      Thomas C. Wall, M.D.  Electronically Signed    RB/MEDQ  D:  07/16/2005  T:  07/16/2005  Job:  [id number]   cc:   [facility]

## 2010-05-23 NOTE — Discharge Summary (Signed)
Erin [name], Erin [name]                          ACCOUNT NO.:  [id number]   MEDICAL RECORD NO.:  [id number]                   PATIENT TYPE:  INP   LOCATION:  2011                                 FACILITY:  [facility]   PHYSICIAN:  Pennelope Bracken, PA [facility]               DATE OF BIRTH:  [birth year]   DATE OF ADMISSION:  08/13/2001  DATE OF DISCHARGE:  08/18/2001                                 DISCHARGE SUMMARY   REASON FOR ADMISSION:  Chest pain.   DISCHARGE DIAGNOSES:  1. Non-ischemic cardiomyopathy, ejection fraction 15% at catheterization     with diffuse hypokinesis.  2. Coronary artery disease with moderate left anterior descending stenosis     estimated to be 50% at catheterization.  3. Probable anterior left ventricular thrombus, Coumadin therapy begun this     admission.  4. Hypertension.  5. Proteinuria, new onset this admission.  6. Status post total abdominal hysterectomy in 1996.   HISTORY OF PRESENT ILLNESS:  This delightful 65 year old black female with  history as outlined above presented for evaluation of 10/10 chest tightness  with shortness of breath and diaphoresis experienced day of admission.  She  related a history of one month of progressive dyspnea on exertion and  increasing lower extremity edema.  She saw her primary care [name] for  this and had been told that she had proteinuria.  The chest discomfort  became more acute, episodes lasting 10 minutes occurring with minimal  exertion with spontaneous resolution.  She was admitted for further  evaluation and treatment.   HOSPITAL COURSE:  The patient was admitted, continued on home beta-blocker,  and continued on home medications  Cardiac enzymes were drawn and the  patient's CK's were slightly elevated but the troponins were normal.  Her B  natriuretic peptide was slightly elevated at 322 and liver function studies  were drawn and these were normal.  The patient was sent for CT of the chest  to rule out PE and  no evidence of pulmonary embolus was found; however,  there was significant cardiomegaly seen of ground-glass opacities, thought  to represent edema, as well as some enlarged mediastinal and right hilar  lymph nodes.  An adenosine Cardiolite was performed, which was negative for  ischemia but showed an ejection fraction of 21%.  ABG's were drawn and these  revealed a very mild respiratory alkalosis.   A transthoracic echocardiogram was performed, which revealed a higher  ejection fraction of 35%-40% with inadequate data for evaluation of left  ventricular regional wall motion.  It was felt at this point that the  patient should have a right and left heart catheterization and this was  performed by Dr. Jacinto Halim on August 12.  He found an ejection fraction of 15%  with diffuse hypokinesis and probable anterolateral thrombus.  There was no  aortic stenosis and 1+ MR with moderate LAD stenosis which estimated to be  about 50%.  The patient tolerated the procedure well and was begun on  heparin and Coumadin therapy.   The patient's I&O's were monitored and they remained therapeutic.  Her  medications were adjusted including initiation of statin therapy and ACE  inhibition and digoxin therapy.  The patient continued to improve and on day  of discharge was seen by Dr. Charlton Haws.   PHYSICAL EXAMINATION:  GENERAL:  On the day of discharge, the patient  offered no complaints of dyspnea or chest pain.  This is a well-nourished,  overweight, middle-aged black female who is in no acute distress.  VITAL SIGNS:  Blood pressure 105/60, pulse 90, respirations 20, 94% on room  air.  Temperature afebrile.  LUNGS:  Clear to auscultation bilaterally.  CARDIOVASCULAR:  S1 and S2.  No obvious murmur.  Right femoral groin site  without evidence of hematoma or bruit.   LABORATORY DATA:  A 12-lead EKG at discharge shows normal sinus rhythm with  a rate of 87 with lateral T-wave flattening.   Discharge  chemistries:  Sodium 135, potassium 3.5, BUN 15, creatinine 1.1,  glucose 137.  The INR at discharge is 1.1 and the patient is being  discharged on Coumadin 5 mg q.d.  CBC:  WBC 8.9, hemoglobin 15.6, hematocrit  47.5, platelets 286.  Routine liver chemistries within normal limits.  Lipid  profile shows a total cholesterol of 290, an HDL of 71, and an LDL of 199.  Triglycerides were 102.   CT of the chest and nuclear imaging are as noted above.  Chest x-ray shows  cardiomegaly with increased vascularity and mild failure on admission.   DISPOSITION:  The patient is discharged to home in the care of her daughter  on the following medications.   DISCHARGE MEDICATIONS:  1. Aspirin 81 one q.d.  2. Lopressor 25 b.i.d.  3. Lasix 40 one q.d.  4. The patient is instructed to stop her hydrochlorothiazide.  5. Pravachol 40 mg 1 q.d.  6. Lanoxin 0.125 mg 1 q.d.  7. Altace 5 mg 1 b.i.d.  8. K-Dur 20 mEq 1 q.d.  9. Nitroglycerin 0.4 mg 1 sublingual every five minutes x3 p.r.n. chest     pain.  10.      Coumadin 5 mg 1 q.d.   The patient is provided with samples of Pravachol and Altace.   ACTIVITY:  No heavy lifting, strenuous activity, sex, or tub bath x2 days.  The patient is encouraged to exercise gently every day.   DIET:  Diet recommended is the Heart Healthy Diet.   DISCHARGE INSTRUCTIONS:  The patient agrees to call the office if her groin  becomes hard or painful.  The Coumadin clinic will call the patient and  schedule an appointment for PT/INR and BMET in 1-2 days.  She will follow up  with the P.A. at the office on August 28 at 3 o'clock and see Dr. Eden Emms  September 17 at 2:15.  She knows to call in the interim with any problems,  questions, or concerns or change or increase in symptoms.                                                Pennelope Bracken, PA [facility]   LK/MEDQ  D:  08/18/2001  T:  08/18/2001  Job:  [id number]   cc:   Shelby Dubin  [facility] Cardiology

## 2010-05-23 NOTE — Assessment & Plan Note (Signed)
[facility]                              CARDIOLOGY OFFICE NOTE   AMANDEEP, HOGSTON                       MRN:          [id number]  DATE:07/30/2005                            DOB:          [birth year]    Erin [name] returns today for followup.  She was recently hospitalized for  congestive failure.  I have not seen her in a while.  She has known ischemic  cardiomyopathy with EF of 20%.   There is a question of mural thrombus.   Her meds were titrated.  She was sent home on b.i.d. Lasix.   Will check BNP and BMET today.  She is doing well.  She also has significant  sleep apnea and is on CPAP and oxygen at home.  She has been doing well  since discharge.  She was encouraged to weigh herself every day.  I will  have the research nurse to see her for the WARCEF trial.   EXAMINATION:  VITAL SIGNS:  Blood pressure is 127/70.  Pulse is 80 and  regular.  LUNGS:  Clear.  NECK:  Carotids are normal.  HEART:  There is an S1 and S2 with distant heart sounds.  ABDOMEN:  Benign.  LOWER EXTREMITIES:  Intact pulses.  Trace edema.   IMPRESSION:  Stable nonischemic cardiomyopathy.  Recent volume overload  improved on b.i.d. diuretic.  Continue current medical regimen.  She is on  b.i.d. Lopressor and still relatively tachycardic.  We may have to up  titrate this in the future.  She will watch salt in her diet and she is to  continue to weigh herself every day.  I will see her back in about a month.  We will check BMET and BNP today.  She will make a decision about the WARCEF  trial.                               Theron Arista C. Eden Emms, MD, [facility]    PCN/MedQ  DD:  07/30/2005  DT:  07/30/2005  Job #:  [id number]

## 2010-05-23 NOTE — H&P (Signed)
NAMECATERIN, TABARES              ACCOUNT NO.:  [id number]   MEDICAL RECORD NO.:  [id number]          PATIENT TYPE:  INP   LOCATION:  4735                         FACILITY:  [facility]   PHYSICIAN:  Denyse Amass, MD DATE OF BIRTH:  Mar 26, 1945   DATE OF ADMISSION:  07/12/2005  DATE OF DISCHARGE:                                HISTORY & PHYSICAL   CHIEF COMPLAINT:  Chest pain, shortness of breath.   HISTORY OF PRESENT ILLNESS:  Erin [name] is a 65 year old woman with a  history of hypertension and ejection fraction of less than 25% on echo in  2006 per last clinic note, nonobstructive coronary artery disease, who  presents with progressively worsening shortness of breath, initially 3-  pillow orthopnea at now 90 degrees, with multiple bouts of PND, chest  heaviness, substernal without radiation, and dyspnea at rest on exertion,  also with palpitations.  She denies syncope or near-syncope.   PAST HISTORY:  1.  Nonischemic cardiomyopathy.      1.  No evidence of ischemia on recent adenosine Cardiolite.      2.  Most recent heart catheterization August 2003, EF 50%, probable LV          thrombus, normal left main, 50% in the LAD, normal circumflex,          normal RCA.  Most recent echo June 23, 2004:  EF less than 15%,          severe diffuse hypokinesis and +1 mitral regurgitation.  2.  Hysterectomy in 1997.  3.  Hypercholesterolemia.  4.  Obstructive sleep apnea.  5.  ACE inhibitor intolerance.   SOCIAL HISTORY:  Lives in [place] with her daughter.  Is an [place]-  American woman.  Denies tobacco, alcohol or drugs.   FAMILY HISTORY:  Significant for coronary artery disease.   REVIEW OF SYSTEMS:  CONSTITUTIONAL:  No fevers, chills, sweats.  Approximately 15-pound weight gain.  HEENT:  No headache, no voice changes,  no vertigo.  No vision or hearing loss.  SKIN:  No rashes, no lesions.  CARDIOPULMONARY:  Per HPI.  GENITOURINARY:  No frequency, urgency, dysuria.  NEUROPSYCHIATRIC:  No weakness, numbness.  MUSCULOSKELETAL:  No myalgias,  joint swellings, deformities.  GASTROINTESTINAL:  No bright red blood per  rectum or melena.  ENDOCRINE:  No polyuria, polydipsia or heat or cold  intolerance.   PHYSICAL EXAMINATION:  VITAL SIGNS:  Temperature 97.8, pulse 117,  respiratory rate 18, blood pressure 150/104, saturations 98% on 2 L.  GENERAL:  She is a tachypneic female sitting upright at 90 degrees.  HEENT:  Normocephalic, atraumatic.  Pupils are equal, round.  Extraocular  motion is intact.  Her dentition is fair.  NECK:  Supple, jugular venous pulsations greater than 12 cm with __________  with the head of the bed at 30 degrees.  CARDIOVASCULAR:  PMI is slightly deviated.  S1, S2, regular, however  tachycardic cadence with summation gallop.  LUNGS:  Bibasilar rales.  SKIN:  No rashes nor lesions.  ABDOMEN:  Soft, nontender.  The liver is slightly enlarged at 6 cm per  scratch test.  Unable to appreciate pulsatile liver because of obesity.  No  fluid waves or ascites appreciated.  RECTAL:  Heme-negative stool.  EXTREMITIES:  No clubbing, cyanosis, 2+ edema in the lower extremities to  the thighs.  MUSCULOSKELETAL:  No joint deformities.  No CVA tenderness.  NEUROLOGIC:  Alert and oriented x3.  Cranial nerves II-XII grossly intact  with strength 5/5 all extremities and muscular groups.  Normal sensation  throughout with normal cerebellar function.   EKG shows a sinus tachycardia at a rate of 119 with diffuse ST segment  depressions, lateral T-wave inversions and flattening, with normal axis and  normal intervals.   LABORATORY DATA:  White count 9.1, hematocrit 44, platelets 380.  Sodium  137, potassium 4.1, chloride 104, bicarb 25, BUN 21, creatinine 1.1, glucose  108.  BNP 1196.   ASSESSMENT AND PLAN:  Congestive heart failure exacerbation.  Will admit for  IV diuresis and medical titration.  Will add low molecular-weight heparin  until  cardiac enzymes are negative x3.  With regard to congestive heart  failure regimen, will continue beta blocker and ARB.  Will add hydralazine,  initially nitroglycerin paste until better blood pressure control and volume  status is obtained and then will switch to Isordil.  When patient is  euvolemic, would repeat functional data/adenosine stress test if enzymes  remain negative.  Will check an echocardiogram when available, check a lipid  panel, continue CPAP.  Once patient remains euvolemic, will consider an ICD  for primary prevention of sudden cardiac death with the EF of less than 35%.  This could also be considered as an outpatient.      Denyse Amass, MD     DBH/MEDQ  D:  07/13/2005  T:  07/13/2005  Job:  [id number]

## 2010-05-23 NOTE — Procedures (Signed)
Erin [name], Erin [name]              ACCOUNT NO.:  [id number]   MEDICAL RECORD NO.:  [id number]          PATIENT TYPE:  OUT   LOCATION:  SLEEP CENTER                 FACILITY:  [facility]   PHYSICIAN:  Marcelyn Bruins, M.D. [facility] DATE OF BIRTH:  Mar 22, 1945   DATE OF STUDY:  12/31/2004                              NOCTURNAL POLYSOMNOGRAM   REFERRING PHYSICIAN:  Dr. Charlton Haws   DATE OF STUDY:  December 31, 2004   INDICATION FOR THE STUDY:  Hypersomnia with sleep apnea.   EPWORTH SCORE:  15.   SLEEP ARCHITECTURE:  The patient had a total sleep time of 384 minutes with  decreased slow wave sleep and REM. Sleep onset latency was normal as was REM  onset. Sleep efficiency was 82%.   RESPIRATORY DATA:  The patient underwent split night study as ordered and  was found to have 180 combined obstructive and central events in the first  140 minutes of sleep. This gave her a combined respiratory disturbance index  of 77 events per hour during this diagnostic period. It should be noted  central apneas made up at least half of the respiratory disturbance during  this interval. Snoring was not quantified by the sleep technician. By  protocol the patient was placed on a petite ComfortGel nasal mask along with  a Deluxe chin strap and CPAP titration was initiated. At one point the  patient was changed to BiPAP with no significant change. Ultimately, an  adequate CPAP titration could not be done due to the late hour of titration  initiation, and the complexities of a mixed obstructive and central sleep  disorder.   OXYGEN DATA:  The patient had O2 desaturation as low as 71% with her events.   CARDIAC DATA:  Occasional PVC noted.   MOVEMENT/PARASOMNIA:  None.   IMPRESSION/RECOMMENDATIONS:  Severe obstructive and central sleep apnea with  a respiratory disturbance index of 77 events per hour during the diagnostic  portion of the study and oxygen desaturation as low as 71%. Attempts were  made to titrate  the patient with CPAP and BiPAP and were unsuccessful in  controlling most of her  events because of the late hour of the titration and the complexity of a  mixed sleep disturbance. I would highly recommend return for a full night of  pressure titration.           ______________________________  Marcelyn Bruins, M.D. [facility]  Diplomate, American Board of Sleep  Medicine     KC/MEDQ  D:  01/09/2005 10:35:08  T:  01/09/2005 13:25:09  Job:  [id number]

## 2010-05-23 NOTE — Procedures (Signed)
Erin [name], Erin [name]              ACCOUNT NO.:  [id number]   MEDICAL RECORD NO.:  [id number]          PATIENT TYPE:  OUT   LOCATION:  SLEEP CENTER                 FACILITY:  [facility]   PHYSICIAN:  Marcelyn Bruins, M.D. [facility] DATE OF BIRTH:  [birth year]   DATE OF STUDY:  03/15/2005                              NOCTURNAL POLYSOMNOGRAM   REFERRING PHYSICIAN:  Dr. Marcelyn Bruins.   DATE OF STUDY:  March 15, 2005.   INDICATION FOR STUDY:  Hypersomnia with sleep apnea. The patient was found  during the diagnostic NPSG to have severe obstructive sleep apnea.   EPWORTH SCORE:  9.   SLEEP ARCHITECTURE:  The patient had a total sleep time of 262 minutes with  significantly decreased slow wave sleep as well as REM. Sleep onset latency  was normal and REM onset was prolonged. Sleep efficiency was decreased at  67%.   RESPIRATORY DATA:  The patient was placed on a small ResMed Ultram Mirage  full-face mask as part of a C-PAP titration protocol. She was started on  BiPAP initially at my request and as she got to the higher pressures, which  failed to totally control her obstructive events, she began to have  significant numbers of central events. The patient was a difficult titration  and it appears that she had the best balance between control of obstructive  events and central events at a bilevel pressure of 22 on the inspiratory  side and 17 on the expiratory side. She also was tried on C-PAP at higher  pressures with better control of the obstructive events but had increased  numbers of central events.   OXYGEN DATA:  The patient had O2 desaturation as low as 80% prior to  optimizing her C-PAP pressure.   CARDIAC DATA:  Occasional PVCs were noted.   MOVEMENT/PARASOMNIA:  No clinically significant movements were noted.   IMPRESSION/RECOMMENDATIONS:  Adequate control of previously diagnosed severe  obstructive sleep apnea with a bilevel pressure of 22/17. The patient used a  small ResMed Ultram  Mirage full-face mask. Again, she was a  very difficult titration, and the above pressure represents a compromise  between control of both obstructive and central events. The patient needs to  be encouraged to lose weight so that her pressure needs are decreased.           ______________________________  Marcelyn Bruins, M.D. [facility]  Diplomate, American Board of Sleep  Medicine     KC/MEDQ  D:  03/25/2005 11:48:01  T:  03/26/2005 01:42:06  Job:  [id number]

## 2010-05-23 NOTE — Cardiovascular Report (Signed)
Erin [name], Erin [name]                          ACCOUNT NO.:  [id number]   MEDICAL RECORD NO.:  [id number]                   PATIENT TYPE:  INP   LOCATION:  2011                                 FACILITY:  [facility]   PHYSICIAN:  Salvadore Farber, MD [facility]            DATE OF BIRTH:  1945/02/02   DATE OF PROCEDURE:  08/16/2001  DATE OF DISCHARGE:                              CARDIAC CATHETERIZATION   PROCEDURES:  Right heart catheterization, left heart catheterization, left  ventriculography, coronary angiography.   INDICATIONS:  Congestive heart failure, severely impaired left ventricular  systolic function.  Adenosine Cardiolite suggest anterior scar.   DIAGNOSTIC TECHNIQUE:  Informed consent was obtained. Under 2% lidocaine  local anesthesia, a 6 French sheath was placed in the right femoral artery  and 8 French sheath in the right femoral vein.  A PA catheter was advanced  via the venous sheath out to the pulmonary artery. Pressures were measured.  Cardiac output was attempted to be measured by thermodilution but would not  register accurately, probably due to low output. Fick cardiac output was not  obtained due to patient's substantial hypoxia.   A pigtail catheter was then advanced into the left ventricle.  Pressures  were measured.  Left ventriculography was performed by power injection.  Coronary angiography was then performed using JL4 and JR4 catheters.  Angiography was performed by hand injection. Following the procedure, the  sheaths were removed in the holding room.   COMPLICATIONS:  Transient right bundle-branch block during the right heart  catheterization.   FINDINGS:  LEFT VENTRICULOGRAM:  LV:  Pressure 131/20, ejection fraction approximately  15% with diffuse hypokinesis and probably thrombus lining the anterolateral  wall.   HEMODYNAMICS:  RA 14, RV 63/21, PA 62/35, pulmonary capillary wedge pressure  15 with V waves to 19. Aorta 130/86.  No aortic stenosis.  Mitral  regurgitation of 1+.   CORONARY ARTERIES:  1. Left main:  No significant disease.  2. LAD:  There is a 50% stenosis after the origin of a large first diagonal     branch.  3. Circumflex:  The circumflex is a moderate sized vessel which gives rise     to a single obtuse marginal branch. The artery is normal.  4. RCA:  The RCA is a large vessel. It is normal.   IMPRESSION:  Severely impaired left ventricular systolic function. There is  a 50% stenosis of the mid left anterior descending. It is conceivable that  she had prior anterior infarction with subsequent adverse ventricular  remodeling. At present, the stenosis is only moderate and with TIMI-3 flow.  She would therefore not benefit from revascularization. Ventriculography  demonstrates a probable thrombosis within the left ventricular cavity.    PLAN:  Will continue medical therapy for her coronary disease and congestive  heart failure with the introduction of digoxin and increase in her ACE  inhibitor. In addition, we will begin  heparin and transition to Coumadin.  The heparin is to start 6 hours after her femoral sheaths are removed.                                                 Salvadore Farber, MD [facility]    WED/MEDQ  D:  08/16/2001  T:  08/20/2001  Job:  [id number]

## 2010-05-23 NOTE — H&P (Signed)
NAMEGREENLEIGH, [name]                          ACCOUNT NO.:  [id number]   MEDICAL RECORD NO.:  [id number]                   PATIENT TYPE:  INP   LOCATION:  2011                                 FACILITY:  [facility]   PHYSICIAN:  Norval Gable             DATE OF BIRTH:  09-16-1945   DATE OF ADMISSION:  08/13/2001  DATE OF DISCHARGE:                                HISTORY & PHYSICAL   CHIEF COMPLAINT:  Chest pain.   HISTORY OF PRESENT ILLNESS:  Erin [name] is a 65 year old black female  with a history of hypertension who complains of intermittent 10 out of 10  chest tightness since this afternoon.  She states that for the past month  she has had progressive dyspnea on exertion that she has difficulty  quantifying. She admits that a year ago she was relatively unlimited but  that, for the past month, her dyspnea has limited her to ascending one  flight of stairs and perhaps 50 feet.  She has also noticed worsening lower  extremity edema over the last two to three weeks for which she has seen her  primary care physician.  The workup for this is in progress, and thus far  the patient reports only being told she has  protein in her urine.   For the past month, she also admits to episodes of fleeting chest discomfort  that she describes as breast pain and occasional chest tightness.  She  states that these symptoms have become more frequent and today much more  intense.  The pain occurs with minimal exertion but may occur at rest.  The  pain lasts for approximately 10 minutes and resolves spontaneously.  She  admits to associated dyspnea and diaphoresis but denies nausea or  palpitations.   PAST MEDICAL HISTORY:  1. Hypertension for five to six years.  2. Proteinuria which is newly discovered.  3. Total abdominal hysterectomy in 1996 for fibroids.   MEDICATIONS:  1. Lopressor, unknown dose.  2. Hydrochlorothiazide, unknown dose.  3. Trimethoprim/sulfamethoxazole for the  past four days for a urinary tract     infection.   ALLERGIES:  The patient states she has no known drug allergies.   SOCIAL HISTORY:  She lives in [place] with her daughter.  Seh denies  tobacco or alcohol use.  She eats an unrestricted diet.  She does not  exercise.  She uses no recreational drugs or over-the-counter medications.   FAMILY HISTORY:  Her mother died of an MI in her 77s and had her first MI in  her 81s.  Her father died of congestive heart failure, but it is unclear if  he had coronary artery disease.  She has a brother who had an MI in his 82s.  She has a sister with an enlarged heart.   REVIEW OF SYSTEMS:  Significant for night sweats for the past one month,  occasional headache, dyspnea on exertion,  stable two the three-pillow  orthopnea, lower extremity edema as mentioned above.  The rest of her Review  of Systems was negative.   ADVANCED DIRECTIVES:  The patient is a full code.   PHYSICAL EXAMINATION:  VITAL SIGNS:  Temperature 98.6, blood pressure  151/91, heart rate 105, respiratory rate 22, SIO2 93% on room air.  GENERAL:  She is a moderately obese, middle-aged female in no acute  distress.  HEENT:  Unremarkable.  NECK:  JVD of approximately 10 cm, no bruits.  CARDIOVASCULAR:  Regular tachycardia with an S4 gallop, no audible murmurs.  LUNGS:  Clear.  ABDOMEN:  Unremarkable.  RECTAL:  Stool is heme negative.  EXTREMITIES:  Reveal 1+ edema bilaterally, good distal pulses, no femoral  bruits.   LABORATORY DATA:  Chest x-ray on personal review reveals mild cardiomegaly  and mild pulmonary edema.   ECG reveals sinus tachycardia at a rate of 103, normal axis, left atrial  enlargement, nonspecific ST-T wave abnormalities in the inferior and  anterolateral leads.   Labs thus far reveal white count 8.8, hemoglobin 15.5, hematocrit 48.2,  platelets 291.  Total CK 211, MB 4.7, troponin I 0.02.   ASSESSMENT/PLAN:  1. Atypical chest pain.  Mrs. Boehne has  hypertension and family history     of cardiac risk factors.  She is unsure of what her lipid status is.  In     the emergency department, she was given an aspirin.  We will admit her     for observation and rule her out for myocardial infarction.  We will     continue the aspirin as well as metoprolol at 25 mg q.6h.  We will start     her on Ramipril 5 mg p.o. q.d.  She had nitroglycerin x 1 for this     atypical chest pain and had no relief.  Should she develop further chest     discomfort, we will attempt to treat this with morphine and/or more     nitrates.  Should she rule in for myocardial infarction, we will start     her on heparin and a IIb/IIIa inhibitor.  For now, however, we will     continue cycling her markers and schedule her for a transthoracic     echocardiogram and a functional study.  She does have mild cardiomegaly     on her exam.  Seh also may have some pulmonary edema as evidenced by he     chest x-ray and her SIO2 of only 93% on room air.  I will treat her with     Lasix.  In addition, she was started on an ACE inhibitor.  We are     obtaining a transthoracic echocardiogram to assess her LV and valvular     function.  2. Hypertension.  As mentioned above, we will continue metoprolol.  We are     starting Ramipril.  Given that we will treat her with Lasix, I am holding     the hydrochlorothiazide.  Should she need another blood pressure reducing     agent after titrating her current medicines, we could consider     amiodipine.  3. Urinary tract infection.  We will obtain a urinalysis and treat this as     indicated.  [employer]    [place]/MEDQ  D:  08/13/2001  T:  08/17/2001  Job:  [id number]   cc:   Dr. Orlando Penner

## 2010-05-25 ENCOUNTER — Other Ambulatory Visit: Payer: Self-pay | Admitting: Cardiovascular Disease

## 2010-05-29 ENCOUNTER — Other Ambulatory Visit: Payer: Self-pay | Admitting: Internal Medicine

## 2010-06-09 ENCOUNTER — Other Ambulatory Visit: Payer: Self-pay | Admitting: Cardiology

## 2010-06-10 ENCOUNTER — Ambulatory Visit (INDEPENDENT_AMBULATORY_CARE_PROVIDER_SITE_OTHER): Payer: Medicare Other | Admitting: *Deleted

## 2010-06-10 DIAGNOSIS — I4891 Unspecified atrial fibrillation: Secondary | ICD-10-CM

## 2010-06-10 LAB — POCT INR: INR: 2.6

## 2010-07-08 ENCOUNTER — Ambulatory Visit (INDEPENDENT_AMBULATORY_CARE_PROVIDER_SITE_OTHER): Payer: Medicare Other | Admitting: *Deleted

## 2010-07-08 DIAGNOSIS — I4891 Unspecified atrial fibrillation: Secondary | ICD-10-CM

## 2010-07-22 ENCOUNTER — Encounter: Payer: Medicare Other | Admitting: *Deleted

## 2010-07-23 ENCOUNTER — Ambulatory Visit (INDEPENDENT_AMBULATORY_CARE_PROVIDER_SITE_OTHER): Payer: Medicare Other | Admitting: *Deleted

## 2010-07-23 DIAGNOSIS — Z7901 Long term (current) use of anticoagulants: Secondary | ICD-10-CM | POA: Insufficient documentation

## 2010-07-23 DIAGNOSIS — I4891 Unspecified atrial fibrillation: Secondary | ICD-10-CM

## 2010-07-23 LAB — POCT INR: INR: 2.5

## 2010-08-11 ENCOUNTER — Other Ambulatory Visit: Payer: Self-pay | Admitting: Cardiology

## 2010-08-11 ENCOUNTER — Other Ambulatory Visit: Payer: Self-pay | Admitting: Cardiovascular Disease

## 2010-08-20 ENCOUNTER — Ambulatory Visit (INDEPENDENT_AMBULATORY_CARE_PROVIDER_SITE_OTHER): Payer: Medicare Other | Admitting: *Deleted

## 2010-08-20 DIAGNOSIS — I4891 Unspecified atrial fibrillation: Secondary | ICD-10-CM

## 2010-08-27 ENCOUNTER — Other Ambulatory Visit: Payer: Self-pay | Admitting: Cardiovascular Disease

## 2010-09-17 ENCOUNTER — Ambulatory Visit (INDEPENDENT_AMBULATORY_CARE_PROVIDER_SITE_OTHER): Payer: Medicare Other | Admitting: *Deleted

## 2010-09-17 DIAGNOSIS — I4891 Unspecified atrial fibrillation: Secondary | ICD-10-CM

## 2010-09-25 LAB — COMPREHENSIVE METABOLIC PANEL
ALT: 38 — ABNORMAL HIGH
AST: 31
Alkaline Phosphatase: 45
CO2: 22
Calcium: 8.5
GFR calc Af Amer: 42 — ABNORMAL LOW
GFR calc non Af Amer: 35 — ABNORMAL LOW
Glucose, Bld: 124 — ABNORMAL HIGH
Potassium: 5.1
Sodium: 133 — ABNORMAL LOW
Total Protein: 5.7 — ABNORMAL LOW

## 2010-09-25 LAB — DIFFERENTIAL
Basophils Absolute: 0.1
Basophils Relative: 1
Eosinophils Absolute: 0.1
Eosinophils Relative: 1
Lymphocytes Relative: 29
Lymphs Abs: 2.8
Monocytes Absolute: 1
Monocytes Relative: 10
Neutro Abs: 5.8
Neutrophils Relative %: 59

## 2010-09-25 LAB — BASIC METABOLIC PANEL WITH GFR
BUN: 21
Calcium: 8.7
Chloride: 103
Creatinine, Ser: 1.04
GFR calc Af Amer: >60
GFR calc non Af Amer: 54 — ABNORMAL LOW

## 2010-09-25 LAB — BASIC METABOLIC PANEL
BUN: 27 — ABNORMAL HIGH
CO2: 23
Chloride: 102
Creatinine, Ser: 1.49 — ABNORMAL HIGH
Glucose, Bld: 106 — ABNORMAL HIGH
Glucose, Bld: 110 — ABNORMAL HIGH
Potassium: 4.5
Potassium: 4.7
Sodium: 134 — ABNORMAL LOW

## 2010-09-25 LAB — POCT I-STAT 3, VENOUS BLOOD GAS (G3P V)
Acid-base deficit: 3 — ABNORMAL HIGH
Acid-base deficit: 5 — ABNORMAL HIGH
Acid-base deficit: 5 — ABNORMAL HIGH
Bicarbonate: 18.9 — ABNORMAL LOW
Bicarbonate: 22.2
Bicarbonate: 23.6
O2 Saturation: 35
O2 Saturation: 43
O2 Saturation: 97
Operator id: 150521
Operator id: 150521
Operator id: 150521
TCO2: 20
TCO2: 24
TCO2: 25
pCO2, Ven: 32.1 — ABNORMAL LOW
pCO2, Ven: 47.2
pCO2, Ven: 47.2
pH, Ven: 7.281
pH, Ven: 7.307 — ABNORMAL HIGH
pH, Ven: 7.379 — ABNORMAL HIGH
pO2, Ven: 23 — CL
pO2, Ven: 27 — CL
pO2, Ven: 87 — ABNORMAL HIGH

## 2010-09-25 LAB — APTT: aPTT: 24

## 2010-09-25 LAB — HEPARIN LEVEL (UNFRACTIONATED)
Heparin Unfractionated: 0.45
Heparin Unfractionated: 0.91 — ABNORMAL HIGH

## 2010-09-25 LAB — CBC
HCT: 39.6
HCT: 45.1
Hemoglobin: 13.3
Hemoglobin: 14.5
MCHC: 32.1
MCHC: 32.1
MCHC: 32.7
MCV: 78
MCV: 78.5
MCV: 78.6
MCV: 78.8
Platelets: 302
Platelets: 323
Platelets: 369
RBC: 5.27 — ABNORMAL HIGH
RBC: 5.74 — ABNORMAL HIGH
RDW: 17.1 — ABNORMAL HIGH
RDW: 17.3 — ABNORMAL HIGH
RDW: 17.4 — ABNORMAL HIGH
WBC: 9.8

## 2010-09-25 LAB — PROTIME-INR
INR: 1
Prothrombin Time: 13.8

## 2010-09-25 LAB — TSH: TSH: 1.698

## 2010-09-25 LAB — B-NATRIURETIC PEPTIDE (CONVERTED LAB): Pro B Natriuretic peptide (BNP): 1393 — ABNORMAL HIGH

## 2010-09-26 LAB — CBC
HCT: 39.3
HCT: 40.4
Hemoglobin: 12.7
Hemoglobin: 13.1
Platelets: 308
Platelets: 319
RBC: 4.78
RBC: 4.96
RBC: 5.18 — ABNORMAL HIGH
RBC: 5.25 — ABNORMAL HIGH
RDW: 17.6 — ABNORMAL HIGH
WBC: 9.2
WBC: 9.6
WBC: 9.6
WBC: 9.6
WBC: 9.6

## 2010-09-26 LAB — BASIC METABOLIC PANEL
BUN: 26 — ABNORMAL HIGH
Calcium: 8.5
Calcium: 8.9
Chloride: 98
Creatinine, Ser: 1.26 — ABNORMAL HIGH
GFR calc Af Amer: 50 — ABNORMAL LOW
GFR calc Af Amer: 52 — ABNORMAL LOW
GFR calc Af Amer: 52 — ABNORMAL LOW
GFR calc Af Amer: 55 — ABNORMAL LOW
GFR calc non Af Amer: 41 — ABNORMAL LOW
GFR calc non Af Amer: 43 — ABNORMAL LOW
GFR calc non Af Amer: 45 — ABNORMAL LOW
Glucose, Bld: 117 — ABNORMAL HIGH
Potassium: 4.3
Potassium: 4.8
Sodium: 132 — ABNORMAL LOW
Sodium: 134 — ABNORMAL LOW

## 2010-09-26 LAB — B-NATRIURETIC PEPTIDE (CONVERTED LAB): Pro B Natriuretic peptide (BNP): 704 — ABNORMAL HIGH

## 2010-09-26 LAB — PROTIME-INR
INR: 1.1
INR: 1.2
INR: 1.2
INR: 1.6 — ABNORMAL HIGH
Prothrombin Time: 14.9
Prothrombin Time: 15.2
Prothrombin Time: 15.5 — ABNORMAL HIGH

## 2010-09-26 LAB — HEPARIN LEVEL (UNFRACTIONATED)
Heparin Unfractionated: 0.48
Heparin Unfractionated: 0.5
Heparin Unfractionated: 0.57
Heparin Unfractionated: 0.62

## 2010-09-30 ENCOUNTER — Other Ambulatory Visit: Payer: Self-pay | Admitting: Cardiovascular Disease

## 2010-09-30 ENCOUNTER — Other Ambulatory Visit: Payer: Self-pay | Admitting: Cardiology

## 2010-10-08 ENCOUNTER — Encounter: Payer: Medicare Other | Admitting: *Deleted

## 2010-10-16 ENCOUNTER — Ambulatory Visit (INDEPENDENT_AMBULATORY_CARE_PROVIDER_SITE_OTHER): Payer: Medicare Other | Admitting: *Deleted

## 2010-10-16 DIAGNOSIS — I4891 Unspecified atrial fibrillation: Secondary | ICD-10-CM

## 2010-11-13 ENCOUNTER — Encounter: Payer: Medicare Other | Admitting: *Deleted

## 2010-12-14 ENCOUNTER — Other Ambulatory Visit: Payer: Self-pay | Admitting: Cardiovascular Disease

## 2011-01-04 IMAGING — CR DG CHEST 1V PORT
1 series · 1 of 1 positions shown · non-contrast
Comparison: Chest radiograph 01/07/1999

CLINICAL DATA: Chest pain

PORTABLE CHEST - 1 VIEW

[view not recorded]
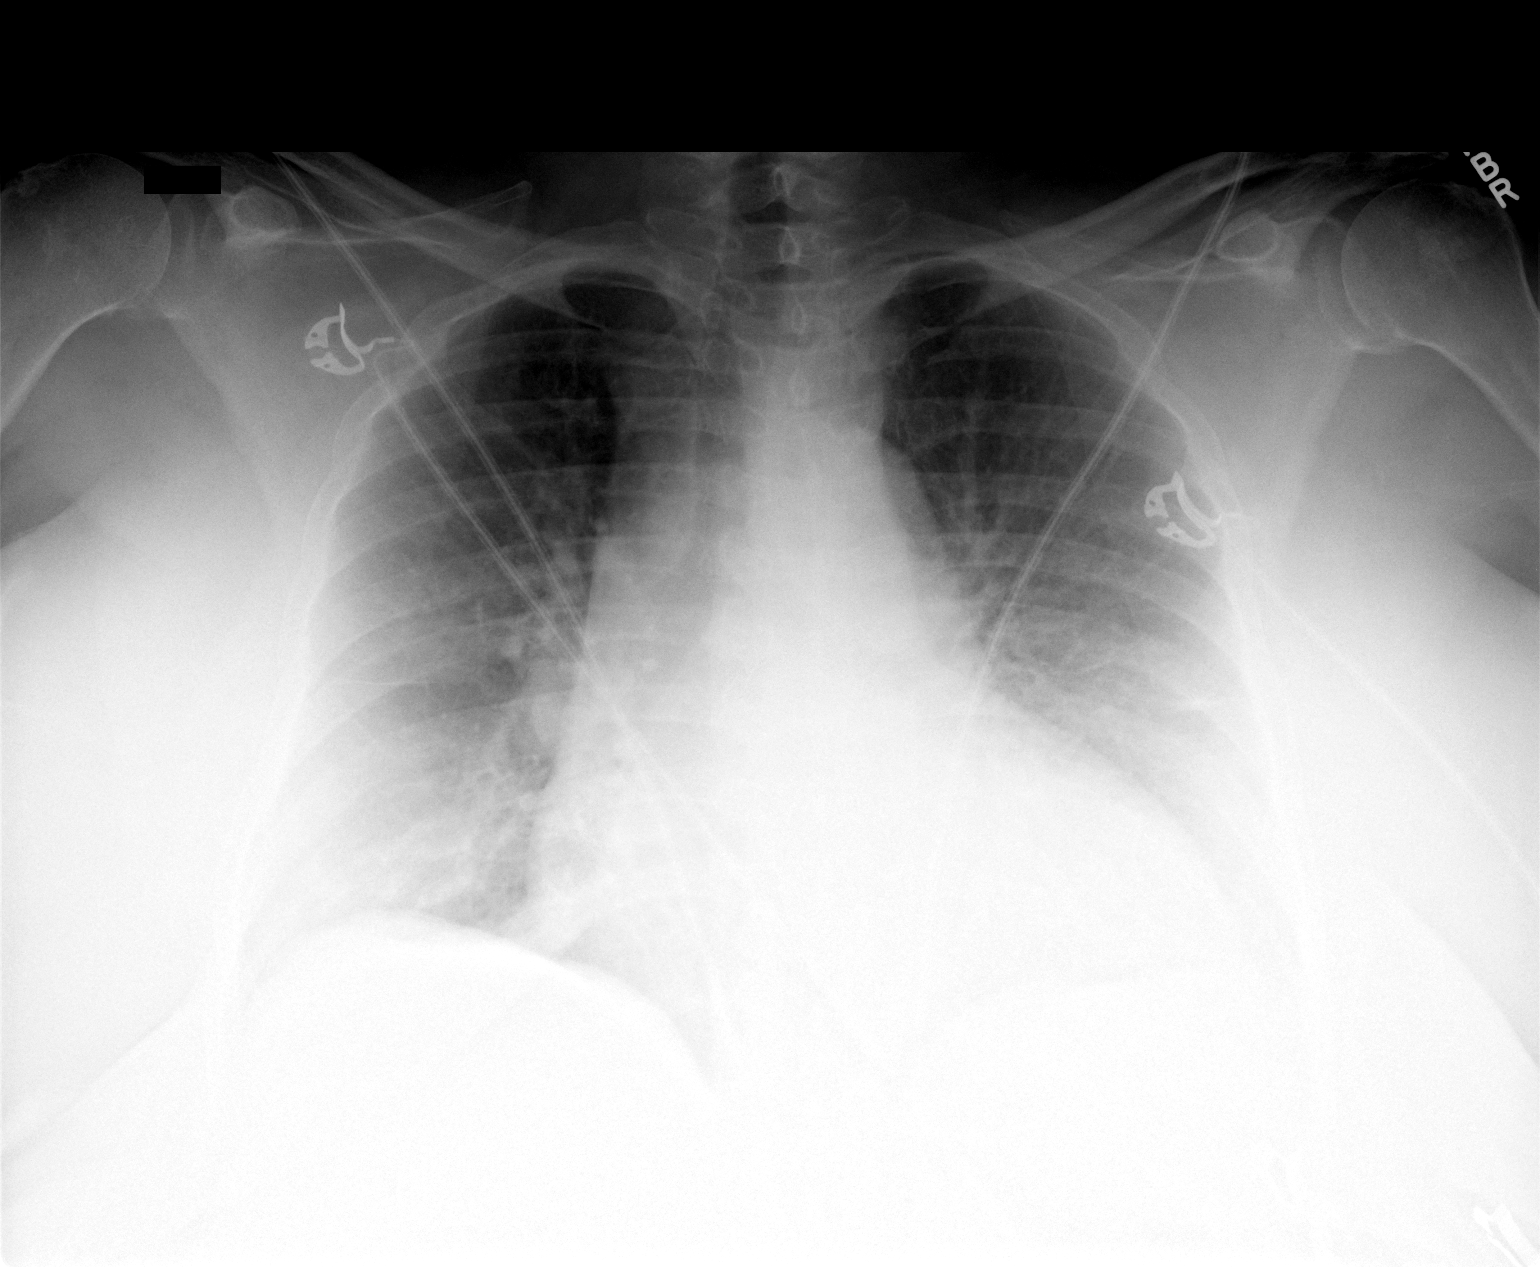

[1 of 1 positions shown; findings below may reference images not displayed]

FINDINGS: The stable enlarged heart silhouette.  There is mild
interstitial edema which is increased from prior.  No pneumothorax.
No focal consolidation
IMPRESSION: Cardiomegaly and interstitial edema consistent with mild congestive
heart failure.

## 2011-01-28 ENCOUNTER — Other Ambulatory Visit: Payer: Self-pay | Admitting: Cardiovascular Disease

## 2011-05-01 ENCOUNTER — Encounter (HOSPITAL_BASED_OUTPATIENT_CLINIC_OR_DEPARTMENT_OTHER): Payer: Self-pay | Admitting: *Deleted

## 2011-05-01 ENCOUNTER — Emergency Department (HOSPITAL_BASED_OUTPATIENT_CLINIC_OR_DEPARTMENT_OTHER)
Admission: EM | Admit: 2011-05-01 | Discharge: 2011-05-01 | Disposition: A | Discharge status: Home / Self Care | Payer: Medicare Other | Attending: Emergency Medicine | Admitting: Emergency Medicine

## 2011-05-01 ENCOUNTER — Emergency Department (INDEPENDENT_AMBULATORY_CARE_PROVIDER_SITE_OTHER): Payer: Medicare Other

## 2011-05-01 DIAGNOSIS — S92302A Fracture of unspecified metatarsal bone(s), left foot, initial encounter for closed fracture: Secondary | ICD-10-CM

## 2011-05-01 DIAGNOSIS — I10 Essential (primary) hypertension: Secondary | ICD-10-CM | POA: Insufficient documentation

## 2011-05-01 DIAGNOSIS — S92309A Fracture of unspecified metatarsal bone(s), unspecified foot, initial encounter for closed fracture: Secondary | ICD-10-CM | POA: Insufficient documentation

## 2011-05-01 DIAGNOSIS — Z79899 Other long term (current) drug therapy: Secondary | ICD-10-CM | POA: Insufficient documentation

## 2011-05-01 DIAGNOSIS — X500XXA Overexertion from strenuous movement or load, initial encounter: Secondary | ICD-10-CM | POA: Insufficient documentation

## 2011-05-01 DIAGNOSIS — Z7982 Long term (current) use of aspirin: Secondary | ICD-10-CM | POA: Insufficient documentation

## 2011-05-01 DIAGNOSIS — X58XXXA Exposure to other specified factors, initial encounter: Secondary | ICD-10-CM

## 2011-05-01 DIAGNOSIS — I509 Heart failure, unspecified: Secondary | ICD-10-CM | POA: Insufficient documentation

## 2011-05-01 HISTORY — DX: Essential (primary) hypertension: I10

## 2011-05-01 MED ORDER — TRAMADOL HCL 50 MG PO TABS: 50.0000 mg | ORAL_TABLET | Freq: Four times a day (QID) | ORAL | Status: AC | PRN

## 2011-05-01 MED ORDER — TRAMADOL HCL 50 MG PO TABS
50.0000 mg | ORAL_TABLET | Freq: Once | ORAL | Status: AC
  Administered 2011-05-01: 50 mg via ORAL
  Filled 2011-05-01: qty 1

## 2011-05-01 NOTE — Discharge Instructions (Signed)
Foot Fracture Your caregiver has diagnosed you as having a foot fracture (broken bone). Your foot has many bones. You have a fracture, or break, in one of these bones. In some cases, your doctor may put on a splint or removable fracture boot until the swelling in your foot has lessened. A cast may or may not be required. HOME CARE INSTRUCTIONS  If you do not have a cast or splint:  You may bear weight on your injured foot as tolerated or advised.   Do not put any weight on your injured foot for as long as directed by your caregiver. Slowly increase the amount of time you walk on the foot as the pain and swelling allows or as advised.   Use crutches until you can bear weight without pain. A gradual increase in weight bearing may help.   Apply ice to the injury for 15 to 20 minutes each hour while awake for the first 2 days. Put the ice in a plastic bag and place a towel between the bag of ice and your skin.   If an ace bandage (stretchy, elastic wrapping bandage) was applied, you may re-wrap it if ankle is more painful or your toes become cold and swollen.  If you have a cast or splint:  Use your crutches for as long as directed by your caregiver.   To lessen the swelling, keep the injured foot elevated on pillows while lying down or sitting. Elevate your foot above your heart.   Apply ice to the injury for 15 to 20 minutes each hour while awake for the first 2 days. Put the ice in a plastic bag and place a thin towel between the bag of ice and your cast.   Plaster or fiberglass cast:   Do not try to scratch the skin under the cast using a sharp or pointed object down the cast.   Check the skin around the cast every day. You may put lotion on any red or sore areas.   Keep your cast clean and dry.   Plaster splint:   Wear the splint until you are seen for a follow-up examination.   You may loosen the elastic around the splint if your toes become numb, tingle, or turn blue or cold. Do  not rest it on anything harder than a pillow in the first 24 hours.   Do not put pressure on any part of your splint. Use your crutches as directed.   Keep your splint dry. It can be protected during bathing with a plastic bag. Do not lower the splint into water.   If you have a fracture boot you may remove it to shower. Bear weight only as instructed by your caregiver.   Only take over-the-counter or prescription medicines for pain, discomfort, or fever as directed by your caregiver.  SEEK IMMEDIATE MEDICAL CARE IF:   Your cast gets damaged or breaks.   You have continued severe pain or more swelling than you did before the cast was put on.   Your skin or nails of your casted foot turn blue, gray, feel cold or numb.   There is a bad smell from your cast.   There is severe pain with movement of your toes.   There are new stains and/or drainage coming from under the cast.  MAKE SURE YOU:   Understand these instructions.   Will watch your condition.   Will get help right away if you are not doing well or get   worse.  Document Released: 12/20/1999 Document Revised: 12/11/2010 Document Reviewed: 01/26/2008 [facility] Patient Information 2012 [place], [place].Avulsion Fracture You have an avulsion fracture. Avulsion fractures are chips of bone pulled off by muscle tendons or ligaments. Common avulsion fractures are on the hand and foot. Avulsion fractures can also involve the elbow, knee, hip, and pelvis. The diagnosis is usually made by X-ray or ultrasound exam. These fractures may cause a deformity if the growth plate of the bone is involved in growing children. A growth plate is an area near the end of the bone where the bone grows from. Avulsion fractures can take several weeks to heal. They need long-term protection and follow-up. Do not remove the splint, immobilizer, or cast that has been applied to treat your injury unless instructed to do so. This is the most important part of your  treatment. Other measures for treating avulsion fractures may include:  Keeping the injured limb at rest and elevated as recommended by your caregiver. This reduces pain and swelling. Use pillows to rest and elevate your arm or leg at night.   Ice packs applied to your injury every 20 minutes while awake for the next 2 days or as directed.   Pain medications.  Avulsion fractures near joints may require rehabilitation. Rarely an avulsion fracture needs surgery to hold pieces together. Proper follow-up care is important. Call your caregiver for a follow-up appointment.  SEEK IMMEDIATE MEDICAL CARE IF:   You notice increasing pain or pressure in the injury.   The area becomes cold, numb, or pale.  Document Released: 01/30/2004 Document Revised: 12/11/2010 Document Reviewed: 03/26/2008 [facility] Patient Information 2012 [place], [place].

## 2011-05-01 NOTE — ED Notes (Signed)
Twisted her right foot. Painful to apply weight. Took 2 Aleve after it happened about 30 minutes ago.

## 2011-05-01 NOTE — ED Provider Notes (Signed)
History     CSN: [id number]  Arrival date & time 05/01/11  2026   First MD Initiated Contact with Patient 05/01/11 2035      Chief Complaint  Patient presents with  . Foot Injury    (Consider location/radiation/quality/duration/timing/severity/associated sxs/prior treatment) HPI Patient is a 66 year old female who presents today for evaluation of left lateral midfoot pain after twisting her foot immediately prior to presentation. Patient took Aleve at home. Patient denies any other injury. She is hypertensive upon presentation but has taken out of her medicines today and thinks this is just secondary to pain. She says when sitting still she does not have significant pain. Upon movement or palpation she says her pain as a 6/10. There are no other associated or modifying factors. Patient has no other associated injuries. Past Medical History  Diagnosis Date  . Hypertension   . CHF (congestive heart failure)     Past Surgical History  Procedure Date  . Abdominal hysterectomy     History reviewed. No pertinent family history.  History  Substance Use Topics  . Smoking status: Never Smoker   . Smokeless tobacco: Not on file  . Alcohol Use: No    OB History    Grav Para Term Preterm Abortions TAB SAB Ect Mult Living                  Review of Systems  Constitutional: Negative.   HENT: Negative.   Eyes: Negative.   Respiratory: Negative.   Cardiovascular: Negative.   Gastrointestinal: Negative.   Genitourinary: Negative.   Musculoskeletal:       See history of present illness  Skin: Negative.   Neurological: Negative.   Hematological: Negative.   Psychiatric/Behavioral: Negative.   All other systems reviewed and are negative.    Allergies  Review of patient's allergies indicates no known allergies.  Home Medications   Current Outpatient Rx  Name Route Sig Dispense Refill  . ASPIRIN 81 MG PO TABS Oral Take 81 mg by mouth daily.    Marland Kitchen CARVEDILOL 12.5 MG PO  TABS  TAKE ONE TABLET BY MOUTH TWICE DAILY 60 tablet 6  . FUROSEMIDE 20 MG PO TABS  TAKE THREE TABLETS BY MOUTH TWICE DAILY 270 tablet 2  . KLOR-CON M20 20 MEQ PO TBCR  TAKE ONE TABLET BY MOUTH EVERY DAY 30 each 12  . PACERONE 200 MG PO TABS  TAKE ONE TABLET BY MOUTH TWICE DAILY 60 each 11  . TRAMADOL HCL 50 MG PO TABS Oral Take 1 tablet (50 mg total) by mouth every 6 (six) hours as needed for pain. 30 tablet 0    BP 180/112  Pulse 90  Temp(Src) 98.1 F (36.7 C) (Oral)  Resp 18  SpO2 92%  Physical Exam  Nursing note and vitals reviewed. GEN: Well-developed, well-nourished female in no distress HEENT: Atraumatic, normocephalic.  EYES: PERRLA BL, no scleral icterus. NECK: Trachea midline, no meningismus CV: regular rate and rhythm.  PULM: No respiratory distress.   Neuro: cranial nerves 2-12 intact, no abnormalities of strength or sensation, A and O x 3 MSK: Patient with tenderness to palpation on the lateral aspect of the left midfoot with no obvious deformity. This is neurovascularly intact. No other extremity injury or abnormalities. Skin: No rashes petechiae, purpura, or jaundice Psych: no abnormality of mood   ED Course  Procedures (including critical care time)  Labs Reviewed - No data to display Dg Foot Complete Left  05/01/2011  *RADIOLOGY REPORT*  Clinical  Data: Lateral left foot pain following an injury.  LEFT FOOT - COMPLETE 3+ VIEW  Comparison: None.  Findings: Oblique fracture through the base of the fifth metatarsal without significant displacement or angulation.  Moderate inferior calcaneal spur formation and distal Achilles tendon ossification. Diffuse osteopenia.  IMPRESSION: Essentially nondisplaced fracture of the base of the fifth metatarsal.  Original Report Authenticated By: Darrol Angel, M.D.     1. Fracture of metatarsal bone of left foot       MDM  Patient was evaluated and treated for her pain with ice as well as tramadol. Plain film did show  mildly displaced fracture of the base of the fifth metatarsal. Patient was told she can followup with orthopedics as needed but likely management will be conservative. Patient was given postop shoe here. She was discharged home in good condition with tramadol as needed for pain. Patient family were comfortable with plan and patient was discharged in good condition.        Cyndra Numbers, MD 05/01/11 2210

## 2011-05-12 ENCOUNTER — Ambulatory Visit: Payer: Self-pay | Admitting: Cardiology

## 2012-04-29 ENCOUNTER — Encounter: Payer: Self-pay | Admitting: *Deleted

## 2012-05-02 ENCOUNTER — Encounter: Payer: Self-pay | Admitting: Cardiovascular Disease

## 2012-05-02 ENCOUNTER — Ambulatory Visit (INDEPENDENT_AMBULATORY_CARE_PROVIDER_SITE_OTHER): Payer: Medicare Other | Admitting: Cardiovascular Disease

## 2012-05-02 ENCOUNTER — Ambulatory Visit (INDEPENDENT_AMBULATORY_CARE_PROVIDER_SITE_OTHER)
Admission: RE | Admit: 2012-05-02 | Discharge: 2012-05-02 | Disposition: A | Discharge status: Home / Self Care | Payer: Medicare Other | Source: Ambulatory Visit | Attending: Cardiovascular Disease | Admitting: Cardiovascular Disease

## 2012-05-02 VITALS — BP 163/102 | HR 101 | Ht 59.0 in | Wt 201.0 lb

## 2012-05-02 DIAGNOSIS — I509 Heart failure, unspecified: Secondary | ICD-10-CM

## 2012-05-02 DIAGNOSIS — I4891 Unspecified atrial fibrillation: Secondary | ICD-10-CM

## 2012-05-02 DIAGNOSIS — I2789 Other specified pulmonary heart diseases: Secondary | ICD-10-CM

## 2012-05-02 MED ORDER — LOSARTAN POTASSIUM 100 MG PO TABS: 100.0000 mg | ORAL_TABLET | Freq: Every day | ORAL | Status: DC

## 2012-05-02 NOTE — Assessment & Plan Note (Addendum)
Maint NSR  ? On chronic anticoagulation.

## 2012-05-02 NOTE — Progress Notes (Signed)
Patient ID: Erin [name], female   DOB: 12-Jun-1945, 67 y.o.   MRN: [id number] 67 yo self referred Not seen since 2011  She has been in [place] for family illness.  She has Known severe DCM EF has been 15% for years. No CAD  Moderate pulmonary HTN.  Has not been compliant with revatio or viagra in past due to cost. Still not compliant with meds.  More dyspnea last year. Has not seen any doctor in over a year. No chest pain palpitations.  Positive PND/Orthpnea.    ROS: Denies fever, malais, weight loss, blurry vision, decreased visual acuity, cough, sputum, SOB, hemoptysis, pleuritic pain, palpitaitons, heartburn, abdominal pain, melena, lower extremity edema, claudication, or rash.  All other systems reviewed and negative   General: Affect appropriate Obese black female HEENT: normal Neck supple with no adenopathy JVP normal no bruits no thyromegaly Lungs clear with no wheezing and good diaphragmatic motion Heart:  S1/S2 no murmur,rub, gallop or click PMI  Not palpable Abdomen: benighn, BS positve, no tenderness, no AAA no bruit.  No HSM or HJR Distal pulses intact with no bruits No edema Neuro non-focal Skin warm and dry No muscular weakness  Medications Current Outpatient Prescriptions  Medication Sig Dispense Refill  . aspirin 81 MG tablet Take 81 mg by mouth daily.      . carvedilol (COREG) 12.5 MG tablet TAKE ONE TABLET BY MOUTH TWICE DAILY  60 tablet  6  . furosemide (LASIX) 20 MG tablet TAKE THREE TABLETS BY MOUTH TWICE DAILY  270 tablet  2  . KLOR-CON M20 20 MEQ tablet TAKE ONE TABLET BY MOUTH EVERY DAY  30 each  12  . PACERONE 200 MG tablet TAKE ONE TABLET BY MOUTH TWICE DAILY  60 each  11   No current facility-administered medications for this visit.    Allergies Review of patient's allergies indicates no known allergies.  Family History: No family history on file.  Social History: History   Social History  . Marital Status: Single    Spouse Name: N/A   Number of Children: N/A  . Years of Education: N/A   Occupational History  . Not on file.   Social History Main Topics  . Smoking status: Never Smoker   . Smokeless tobacco: Not on file  . Alcohol Use: No  . Drug Use: No  . Sexually Active:    Other Topics Concern  . Not on file   Social History Narrative  . No narrative on file    Electrocardiogram:  ST rate 101  nonspecfici ST/T wave changes no change from 2011  Assessment and Plan

## 2012-05-02 NOTE — Assessment & Plan Note (Signed)
Refer to CHF clinic Repeat echo.  Check BMET and BNP  Start Cozaar 100mg .  May need further titration of meds.

## 2012-05-02 NOTE — Assessment & Plan Note (Signed)
Ambulated and resting sat 93% with walking still 92% Will check CXR and echo Dyspnic just sitting in office.

## 2012-05-02 NOTE — Patient Instructions (Signed)
You have been referred to HEART FAILURE CLINIC ASAP Your physician has recommended you make the following change in your medication:   ADD LOSARTAN  100 MG 1 EVERY DAY   Your physician recommends that you return for lab work in: TODAY  BMET BNP  CBC  LIVER DX  DYSPNEA  Your physician has requested that you have an echocardiogram. Echocardiography is a painless test that uses sound waves to create images of your heart. It provides your doctor with information about the size and shape of your heart and how well your heart's chambers and valves are working. This procedure takes approximately one hour. There are no restrictions for this procedure. A chest x-ray takes a picture of the organs and structures inside the chest, including the heart, lungs, and blood vessels. This test can show several things, including, whether the heart is enlarges; whether fluid is building up in the lungs; and whether pacemaker / defibrillator leads are still in place.

## 2012-05-02 NOTE — Addendum Note (Signed)
Addended by: Freddi Starr on: 05/02/2012 05:22 PM   Modules accepted: Orders

## 2012-05-03 LAB — BASIC METABOLIC PANEL
BUN: 16 mg/dL (ref 6–23)
Chloride: 103 mEq/L (ref 96–112)
Potassium: 3.8 mEq/L (ref 3.5–5.1)
Sodium: 137 mEq/L (ref 135–145)

## 2012-05-03 LAB — HEPATIC FUNCTION PANEL
AST: 24 U/L (ref 0–37)
Albumin: 3.9 g/dL (ref 3.5–5.2)
Alkaline Phosphatase: 60 U/L (ref 39–117)
Bilirubin, Direct: 0.1 mg/dL (ref 0.0–0.3)
Total Bilirubin: 1.2 mg/dL (ref 0.3–1.2)

## 2012-05-03 LAB — BRAIN NATRIURETIC PEPTIDE: Pro B Natriuretic peptide (BNP): 559 pg/mL — ABNORMAL HIGH (ref 0.0–100.0)

## 2012-05-03 LAB — CBC WITH DIFFERENTIAL/PLATELET
Basophils Relative: 0.1 % (ref 0.0–3.0)
Eosinophils Absolute: 0.7 10*3/uL (ref 0.0–0.7)
Eosinophils Relative: 9.4 % — ABNORMAL HIGH (ref 0.0–5.0)
Lymphocytes Relative: 35.3 % (ref 12.0–46.0)
MCHC: 33.4 g/dL (ref 30.0–36.0)
Neutrophils Relative %: 43.6 % (ref 43.0–77.0)
Platelets: 354 10*3/uL (ref 150.0–400.0)
RBC: 4.91 Mil/uL (ref 3.87–5.11)
WBC: 8 10*3/uL (ref 4.5–10.5)

## 2012-05-04 ENCOUNTER — Other Ambulatory Visit: Payer: Self-pay | Admitting: *Deleted

## 2012-05-04 MED ORDER — POTASSIUM CHLORIDE CRYS ER 20 MEQ PO TBCR: EXTENDED_RELEASE_TABLET | ORAL | Status: DC

## 2012-05-04 MED ORDER — AMIODARONE HCL 200 MG PO TABS: ORAL_TABLET | ORAL | Status: DC

## 2012-05-04 MED ORDER — FUROSEMIDE 20 MG PO TABS: ORAL_TABLET | ORAL | Status: DC

## 2012-05-04 MED ORDER — CARVEDILOL 12.5 MG PO TABS: ORAL_TABLET | ORAL | Status: DC

## 2012-05-06 ENCOUNTER — Ambulatory Visit (HOSPITAL_COMMUNITY): Payer: Medicare Other | Attending: Internal Medicine

## 2012-05-06 DIAGNOSIS — I4891 Unspecified atrial fibrillation: Secondary | ICD-10-CM | POA: Insufficient documentation

## 2012-05-06 DIAGNOSIS — R0609 Other forms of dyspnea: Secondary | ICD-10-CM | POA: Insufficient documentation

## 2012-05-06 DIAGNOSIS — I509 Heart failure, unspecified: Secondary | ICD-10-CM | POA: Insufficient documentation

## 2012-05-06 DIAGNOSIS — I2789 Other specified pulmonary heart diseases: Secondary | ICD-10-CM

## 2012-05-06 DIAGNOSIS — I2510 Atherosclerotic heart disease of native coronary artery without angina pectoris: Secondary | ICD-10-CM

## 2012-05-06 DIAGNOSIS — I428 Other cardiomyopathies: Secondary | ICD-10-CM | POA: Insufficient documentation

## 2012-05-06 DIAGNOSIS — R0989 Other specified symptoms and signs involving the circulatory and respiratory systems: Secondary | ICD-10-CM | POA: Insufficient documentation

## 2012-05-06 NOTE — Progress Notes (Signed)
Echocardiogram performed.  

## 2012-05-15 ENCOUNTER — Emergency Department (HOSPITAL_BASED_OUTPATIENT_CLINIC_OR_DEPARTMENT_OTHER): Payer: Medicare Other

## 2012-05-15 ENCOUNTER — Emergency Department (HOSPITAL_BASED_OUTPATIENT_CLINIC_OR_DEPARTMENT_OTHER)
Admission: EM | Admit: 2012-05-15 | Discharge: 2012-05-15 | Disposition: A | Discharge status: Home / Self Care | Payer: Medicare Other | Attending: Emergency Medicine | Admitting: Emergency Medicine

## 2012-05-15 ENCOUNTER — Encounter (HOSPITAL_BASED_OUTPATIENT_CLINIC_OR_DEPARTMENT_OTHER): Payer: Self-pay

## 2012-05-15 DIAGNOSIS — I4891 Unspecified atrial fibrillation: Secondary | ICD-10-CM | POA: Insufficient documentation

## 2012-05-15 DIAGNOSIS — S7000XA Contusion of unspecified hip, initial encounter: Secondary | ICD-10-CM | POA: Insufficient documentation

## 2012-05-15 DIAGNOSIS — Z79899 Other long term (current) drug therapy: Secondary | ICD-10-CM | POA: Insufficient documentation

## 2012-05-15 DIAGNOSIS — Z8669 Personal history of other diseases of the nervous system and sense organs: Secondary | ICD-10-CM | POA: Insufficient documentation

## 2012-05-15 DIAGNOSIS — Z7982 Long term (current) use of aspirin: Secondary | ICD-10-CM | POA: Insufficient documentation

## 2012-05-15 DIAGNOSIS — Y929 Unspecified place or not applicable: Secondary | ICD-10-CM | POA: Insufficient documentation

## 2012-05-15 DIAGNOSIS — S7002XA Contusion of left hip, initial encounter: Secondary | ICD-10-CM

## 2012-05-15 DIAGNOSIS — W1789XA Other fall from one level to another, initial encounter: Secondary | ICD-10-CM | POA: Insufficient documentation

## 2012-05-15 DIAGNOSIS — I509 Heart failure, unspecified: Secondary | ICD-10-CM | POA: Insufficient documentation

## 2012-05-15 DIAGNOSIS — S60229A Contusion of unspecified hand, initial encounter: Secondary | ICD-10-CM | POA: Insufficient documentation

## 2012-05-15 DIAGNOSIS — I10 Essential (primary) hypertension: Secondary | ICD-10-CM | POA: Insufficient documentation

## 2012-05-15 DIAGNOSIS — S60221A Contusion of right hand, initial encounter: Secondary | ICD-10-CM

## 2012-05-15 DIAGNOSIS — Y9301 Activity, walking, marching and hiking: Secondary | ICD-10-CM | POA: Insufficient documentation

## 2012-05-15 MED ORDER — HYDROCODONE-ACETAMINOPHEN 5-325 MG PO TABS
2.0000 | ORAL_TABLET | Freq: Once | ORAL | Status: AC
  Administered 2012-05-15: 2 via ORAL
  Filled 2012-05-15: qty 2

## 2012-05-15 MED ORDER — HYDROCODONE-ACETAMINOPHEN 5-325 MG PO TABS: 2.0000 | ORAL_TABLET | ORAL | Status: DC | PRN

## 2012-05-15 NOTE — ED Notes (Signed)
Pt states that she has severe L hip and knee pain.  Pt denies injury or trama.  Pt took tramadol at home with no relief.  PMS intact, ROM limited d/t pain.

## 2012-05-15 NOTE — ED Provider Notes (Signed)
History     CSN: [id number]  Arrival date & time 05/15/12  1005   First MD Initiated Contact with Patient 05/15/12 1148      Chief Complaint  Patient presents with  . Hip Pain  . Knee Pain    (Consider location/radiation/quality/duration/timing/severity/associated sxs/prior treatment) Patient is a 67 y.o. female presenting with fall. The history is provided by the patient. No language interpreter was used.  Fall The accident occurred yesterday. The fall occurred while walking. She fell from a height of 1 to 2 ft. She landed on carpet. The point of impact was the left hip. The pain is at a severity of 4/10. The patient is experiencing no pain. She was ambulatory at the scene. There was no drug use involved in the accident. She has tried nothing for the symptoms. The treatment provided no relief.    Past Medical History  Diagnosis Date  . Hypertension   . CHF (congestive heart failure)   . OBSTRUCTIVE SLEEP APNEA   . Atrial fibrillation   . Encounter for long-term (current) use of anticoagulants     Past Surgical History  Procedure Laterality Date  . Abdominal hysterectomy      History reviewed. No pertinent family history.  History  Substance Use Topics  . Smoking status: Never Smoker   . Smokeless tobacco: Not on file  . Alcohol Use: No    OB History   Grav Para Term Preterm Abortions TAB SAB Ect Mult Living                  Review of Systems  Musculoskeletal: Positive for myalgias and joint swelling.  All other systems reviewed and are negative.    Allergies  Review of patient's allergies indicates no known allergies.  Home Medications   Current Outpatient Rx  Name  Route  Sig  Dispense  Refill  . amiodarone (PACERONE) 200 MG tablet      TAKE ONE TABLET BY MOUTH TWICE DAILY   60 tablet   4   . aspirin 81 MG tablet   Oral   Take 81 mg by mouth daily.         . carvedilol (COREG) 12.5 MG tablet      TAKE ONE TABLET BY MOUTH TWICE DAILY   60  tablet   6   . furosemide (LASIX) 20 MG tablet      TAKE THREE TABLETS BY MOUTH TWICE DAILY   270 tablet   2   . losartan (COZAAR) 100 MG tablet   Oral   Take 1 tablet (100 mg total) by mouth daily.   90 tablet   3   . potassium chloride SA (KLOR-CON M20) 20 MEQ tablet      TAKE ONE TABLET BY MOUTH EVERY DAY   30 tablet   4   . traMADol (ULTRAM) 50 MG tablet   Oral   Take 50 mg by mouth every 6 (six) hours as needed for pain.           BP 155/95  Pulse 83  Temp(Src) 97.7 F (36.5 C) (Oral)  Resp 20  Ht 5' (1.524 m)  Wt 200 lb (90.719 kg)  BMI 39.06 kg/m2  SpO2 92%  Physical Exam  Nursing note and vitals reviewed. Constitutional: She is oriented to person, place, and time. She appears well-developed and well-nourished.  HENT:  Head: Normocephalic.  Neck: Normal range of motion.  Cardiovascular: Normal rate.   Pulmonary/Chest: Effort normal.  Abdominal: Soft.  Musculoskeletal: She exhibits tenderness.  Tender left hip pain with range of motion,  Bruised right 3rd metacarpal right hand.  Pain with movement of left knee  Neurological: She is alert and oriented to person, place, and time. She has normal reflexes.  Skin: Skin is warm.  Psychiatric: She has a normal mood and affect.    ED Course  Procedures (including critical care time)  Labs Reviewed - No data to display No results found.   1. Contusion of left hip, initial encounter   2. Contusion of right hand, initial encounter       MDM  Pt advised to follow up with her MD.  Pain medication        Elson Areas, PA-C 05/15/12 1428

## 2012-05-15 NOTE — ED Provider Notes (Signed)
Medical screening examination/treatment/procedure(s) were performed by non-physician practitioner and as supervising physician I was immediately available for consultation/collaboration.  Ethelda Chick, MD 05/15/12 1447

## 2012-05-23 ENCOUNTER — Encounter (HOSPITAL_COMMUNITY): Payer: Self-pay

## 2012-05-23 ENCOUNTER — Ambulatory Visit (HOSPITAL_COMMUNITY)
Admission: RE | Admit: 2012-05-23 | Discharge: 2012-05-23 | Disposition: A | Discharge status: Home / Self Care | Payer: Medicare Other | Source: Ambulatory Visit | Attending: Internal Medicine | Admitting: Internal Medicine

## 2012-05-23 VITALS — BP 138/92 | HR 84 | Wt 200.1 lb

## 2012-05-23 DIAGNOSIS — G4733 Obstructive sleep apnea (adult) (pediatric): Secondary | ICD-10-CM | POA: Insufficient documentation

## 2012-05-23 DIAGNOSIS — I5020 Unspecified systolic (congestive) heart failure: Secondary | ICD-10-CM | POA: Insufficient documentation

## 2012-05-23 DIAGNOSIS — I509 Heart failure, unspecified: Secondary | ICD-10-CM | POA: Insufficient documentation

## 2012-05-23 MED ORDER — CARVEDILOL 12.5 MG PO TABS: 18.7500 mg | ORAL_TABLET | Freq: Two times a day (BID) | ORAL | Status: DC

## 2012-05-23 NOTE — Patient Instructions (Addendum)
Follow up with Dr. Eden Emms in 1 month   Follow up with the HF clinic in 3 months  Increase carvedilol 1.5 tabs (18.75 mg) twice daily  Will refer to EP for possible ICD implantation.

## 2012-05-23 NOTE — Assessment & Plan Note (Signed)
Patient seen and examined with Ulyess Blossom, PA-C. We discussed all aspects of the encounter. I agree with the assessment as stated above. My thoughts below.  Overall she is doing pretty well despite her LV dysfunction.NYHA II-III. Volume status looks good.  We spent a long time educating her on HF with special emphasis on fluid restriction, daily weights and sliding scale lasix. We provided her with Zone tools and weight calendars. Will increase carvedilol to 18.75 bid. We also discussed ICD and she is interested in a referral to EP which we made. She will f/u with Dr. Eden Emms again soon and we will see her back in 2-3 months to f/u on her education. After that will follow on PRN basis. No role for w/u for advanced therapies at this point and doubt she would be good candidate.

## 2012-05-23 NOTE — Progress Notes (Signed)
Primary Cardiologist: Dr. Eden Emms  HPI: Erin [name] is a 67 y.o. female with prior history of NICM/dilated cardiomyopathy with EF 25%, HTN and OSA with CPAP (noncompliant).    Last cath in 08/2001 showed normal left main, 50% in the LAD, normal circumflex,  normal RCA.  Echo on 05/06/12 showing LVEF 25% with diffuse hypokinesis.  Mod dilated LA.  Mildly dilated RA. PAPP 38 mmHg.    She has been referred by Dr. Eden Emms for systolic heart failure.  She feels pretty good today.  She denies dyspnea, orthopnea or PND right now.  Sometimes she does have a problem with steps but not recently.  She does not weigh daily.  Has CPAP but does not wear it at night due to it not being comfortable.  Does miss some of her medications.    Lives with daughter and her family.  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  ROS: All systems negative except as listed in HPI, PMH and Problem List.  Past Medical History  Diagnosis Date  . Hypertension   . CHF (congestive heart failure)   . OBSTRUCTIVE SLEEP APNEA   . Atrial fibrillation   . Encounter for long-term (current) use of anticoagulants     Current Outpatient Prescriptions  Medication Sig Dispense Refill  . amiodarone (PACERONE) 200 MG tablet TAKE ONE TABLET BY MOUTH TWICE DAILY  60 tablet  4  . aspirin 81 MG tablet Take 81 mg by mouth daily.      . carvedilol (COREG) 12.5 MG tablet TAKE ONE TABLET BY MOUTH TWICE DAILY  60 tablet  6  . furosemide (LASIX) 20 MG tablet TAKE THREE TABLETS BY MOUTH TWICE DAILY  270 tablet  2  . HYDROcodone-acetaminophen (NORCO/VICODIN) 5-325 MG per tablet Take 2 tablets by mouth every 4 (four) hours as needed for pain.  16 tablet  0  . losartan (COZAAR) 100 MG tablet Take 1 tablet (100 mg total) by mouth daily.  90 tablet  3  . potassium chloride SA (KLOR-CON M20) 20 MEQ tablet TAKE ONE TABLET BY MOUTH EVERY DAY  30 tablet  4  . traMADol (ULTRAM) 50 MG tablet Take 50 mg by mouth every 6 (six) hours as needed for pain.        No current facility-administered medications for this encounter.     PHYSICAL EXAM: Filed Vitals:   05/23/12 1128  BP: 138/92  Pulse: 84  Weight: 200 lb 1.9 oz (90.774 kg)  SpO2: 93%    General:  Well appearing. No resp difficulty HEENT: normal Neck: supple. JVP flat. Carotids 2+ bilaterally; no bruits. No lymphadenopathy or thryomegaly appreciated. Cor: PMI normal. Regular rate & rhythm. No rubs, gallops or murmurs. Lungs: clear Abdomen: soft, nontender, nondistended. No hepatosplenomegaly. No bruits or masses. Good bowel sounds. Extremities: no cyanosis, clubbing, rash, edema Neuro: alert & orientedx3, cranial nerves grossly intact. Moves all 4 extremities w/o difficulty. Affect pleasant.   ASSESSMENT & PLAN:

## 2012-06-16 ENCOUNTER — Encounter: Payer: Self-pay | Admitting: Internal Medicine

## 2012-06-16 ENCOUNTER — Ambulatory Visit (INDEPENDENT_AMBULATORY_CARE_PROVIDER_SITE_OTHER): Payer: Medicare Other | Admitting: Internal Medicine

## 2012-06-16 ENCOUNTER — Telehealth: Payer: Self-pay | Admitting: Internal Medicine

## 2012-06-16 ENCOUNTER — Encounter: Payer: Self-pay | Admitting: *Deleted

## 2012-06-16 ENCOUNTER — Other Ambulatory Visit: Payer: Self-pay | Admitting: *Deleted

## 2012-06-16 VITALS — BP 142/80 | HR 81 | Ht 60.0 in | Wt 199.0 lb

## 2012-06-16 DIAGNOSIS — I4891 Unspecified atrial fibrillation: Secondary | ICD-10-CM

## 2012-06-16 DIAGNOSIS — I509 Heart failure, unspecified: Secondary | ICD-10-CM

## 2012-06-16 NOTE — Telephone Encounter (Signed)
New Problem:    Patient called in because she has not received a call back regarding her medication change.  Please call back.

## 2012-06-16 NOTE — Assessment & Plan Note (Signed)
SHe has chronic systolic CHF, and longstanding LV dysfunction with 2D echo several weeks ago demonstrating severe LV dysfunction with an EF 25%. I have discussed the treatment options with the patient and she wises to proceed. Her ICD will be scheduled to be implanted in several weeks.

## 2012-06-16 NOTE — Progress Notes (Signed)
HPI [name] is referred today for consideration for ICD implant. She is a pleasant 67 yo woman with longstanding chronic systolic CHF. She also has PAF and has been maintained in NSR on amiodarone. She has had chronic and stable class 2 CHF. She has never had syncope. She has been on maximal medical therapy. She denies syncope.  No Known Allergies   Current Outpatient Prescriptions  Medication Sig Dispense Refill  . aspirin 81 MG tablet Take 81 mg by mouth daily.      . carvedilol (COREG) 12.5 MG tablet Take 1.5 tablets (18.75 mg total) by mouth 2 (two) times daily with a meal.  60 tablet  6  . furosemide (LASIX) 20 MG tablet TAKE THREE TABLETS BY MOUTH TWICE DAILY  270 tablet  2  . HYDROcodone-acetaminophen (NORCO/VICODIN) 5-325 MG per tablet Take 2 tablets by mouth every 4 (four) hours as needed for pain.  16 tablet  0  . losartan (COZAAR) 100 MG tablet Take 1 tablet (100 mg total) by mouth daily.  90 tablet  3  . potassium chloride SA (KLOR-CON M20) 20 MEQ tablet TAKE ONE TABLET BY MOUTH EVERY DAY  30 tablet  4  . traMADol (ULTRAM) 50 MG tablet Take 50 mg by mouth every 6 (six) hours as needed for pain.      Marland Kitchen amiodarone (PACERONE) 200 MG tablet Take one tablet by mouth once daily       No current facility-administered medications for this visit.     Past Medical History  Diagnosis Date  . Hypertension   . CHF (congestive heart failure)   . OBSTRUCTIVE SLEEP APNEA   . Atrial fibrillation   . Encounter for long-term (current) use of anticoagulants     ROS:   All systems reviewed and negative except as noted in the HPI.   Past Surgical History  Procedure Laterality Date  . Abdominal hysterectomy       No family history on file.   History   Social History  . Marital Status: Single    Spouse Name: N/A    Number of Children: N/A  . Years of Education: N/A   Occupational History  . Not on file.   Social History Main Topics  . Smoking status: Never Smoker   .  Smokeless tobacco: Not on file  . Alcohol Use: No  . Drug Use: No  . Sexually Active:    Other Topics Concern  . Not on file   Social History Narrative  . No narrative on file     BP 142/80  Pulse 81  Ht 5' (1.524 m)  Wt 199 lb (90.266 kg)  BMI 38.86 kg/m2  Physical Exam:  Obese appearing middle aged woman, NAD HEENT: Unremarkable Neck:  7 cm JVD, no thyromegally Lungs:  Clear HEART:  Regular rate rhythm, no murmurs, no rubs, no clicks Abd:  soft, positive bowel sounds, no organomegally, no rebound, no guarding Ext:  2 plus pulses, no edema, no cyanosis, no clubbing Skin:  No rashes no nodules Neuro:  CN II through XII intact, motor grossly intact  EKG -NSR with nonspecific T wave abnormality  Assess/Plan:

## 2012-06-16 NOTE — Telephone Encounter (Signed)
SPOKE WITH PT  FROM NUMBER LISTED ON  SNAPSHOT  NUMBER LISTED IN MESSAGE BELONGS TO  DIFFER PT  PER PT JUST SAW DR Ladona Ridgel   HAS NOT CALLED OFFICE .Erin [name]

## 2012-06-16 NOTE — Patient Instructions (Addendum)
Your physician has recommended you make the following change in your medication:  1) DECREASE AMIODARONE TO 200 mg one tablet by mouth daily.  Your physician has recommended that you have a defibrillator inserted. An implantable cardioverter defibrillator (ICD) is a small device that is placed in your chest or, in rare cases, your abdomen. This device uses electrical pulses or shocks to help control life-threatening, irregular heartbeats that could lead the heart to suddenly stop beating (sudden cardiac arrest). Leads are attached to the ICD that goes into your heart. This is done in the hospital and usually requires an overnight stay. Please see the instruction sheet given to you today for more information.

## 2012-06-16 NOTE — Assessment & Plan Note (Signed)
She is maintaining NSR and I have asked the patient to decrease her amiodarone to 200 mg daily. Will follow. It is unclear to me why she is not on chronic anti-coagulation. We will research this.

## 2012-07-05 ENCOUNTER — Ambulatory Visit: Payer: Medicare Other | Admitting: Cardiovascular Disease

## 2012-07-07 ENCOUNTER — Encounter (HOSPITAL_COMMUNITY): Payer: Self-pay | Admitting: Pharmacy Technician

## 2012-07-12 ENCOUNTER — Telehealth: Payer: Self-pay

## 2012-07-12 NOTE — Telephone Encounter (Signed)
**Note De-Identified Erin [name] Obfuscation** Pt advised, she verbalized understanding and agrees with new time.

## 2012-07-12 NOTE — Telephone Encounter (Signed)
LMTCB. Pts ICD procedure that is scheduled on 7/18 has been changed from early am to 2 pm, pt to arrive at 12:00 on that day.

## 2012-07-15 ENCOUNTER — Other Ambulatory Visit (INDEPENDENT_AMBULATORY_CARE_PROVIDER_SITE_OTHER): Payer: Medicare Other

## 2012-07-15 DIAGNOSIS — I4891 Unspecified atrial fibrillation: Secondary | ICD-10-CM

## 2012-07-15 LAB — CBC WITH DIFFERENTIAL/PLATELET
Basophils Relative: 0.9 % (ref 0.0–3.0)
Eosinophils Relative: 13.8 % — ABNORMAL HIGH (ref 0.0–5.0)
HCT: 37.7 % (ref 36.0–46.0)
Lymphs Abs: 2 10*3/uL (ref 0.7–4.0)
MCV: 73.7 fl — ABNORMAL LOW (ref 78.0–100.0)
Monocytes Absolute: 0.8 10*3/uL (ref 0.1–1.0)
Platelets: 352 10*3/uL (ref 150.0–400.0)
RBC: 5.11 Mil/uL (ref 3.87–5.11)
WBC: 5.8 10*3/uL (ref 4.5–10.5)

## 2012-07-15 LAB — BASIC METABOLIC PANEL
BUN: 16 mg/dL (ref 6–23)
Chloride: 104 mEq/L (ref 96–112)
Potassium: 4.7 mEq/L (ref 3.5–5.1)

## 2012-07-20 ENCOUNTER — Telehealth: Payer: Self-pay | Admitting: Internal Medicine

## 2012-07-20 NOTE — Telephone Encounter (Signed)
Patient will be at the hospital at 5:30am for a 7:30am case on Fri

## 2012-07-20 NOTE — Telephone Encounter (Signed)
Follow up  ° ° ° °Returning call back to nurse  °

## 2012-07-21 MED ORDER — CEFAZOLIN SODIUM-DEXTROSE 2-3 GM-% IV SOLR
2.0000 g | INTRAVENOUS | Status: DC
  Filled 2012-07-21: qty 50

## 2012-07-21 MED ORDER — SODIUM CHLORIDE 0.9 % IR SOLN
80.0000 mg | Status: DC
  Filled 2012-07-21: qty 2

## 2012-07-22 ENCOUNTER — Encounter (HOSPITAL_COMMUNITY)
Admission: RE | Disposition: A | Discharge status: Home / Self Care | Payer: Self-pay | Source: Ambulatory Visit | Attending: Internal Medicine

## 2012-07-22 ENCOUNTER — Encounter (HOSPITAL_COMMUNITY): Payer: Self-pay | Admitting: General Practice

## 2012-07-22 ENCOUNTER — Ambulatory Visit (HOSPITAL_COMMUNITY)
Admission: RE | Admit: 2012-07-22 | Discharge: 2012-07-23 | Disposition: A | Discharge status: Home / Self Care | Payer: Medicare Other | Source: Ambulatory Visit | Attending: Internal Medicine | Admitting: Internal Medicine

## 2012-07-22 DIAGNOSIS — I428 Other cardiomyopathies: Secondary | ICD-10-CM | POA: Diagnosis present

## 2012-07-22 DIAGNOSIS — I5022 Chronic systolic (congestive) heart failure: Secondary | ICD-10-CM | POA: Insufficient documentation

## 2012-07-22 DIAGNOSIS — Z95810 Presence of automatic (implantable) cardiac defibrillator: Secondary | ICD-10-CM | POA: Diagnosis not present

## 2012-07-22 DIAGNOSIS — Z7901 Long term (current) use of anticoagulants: Secondary | ICD-10-CM

## 2012-07-22 DIAGNOSIS — I420 Dilated cardiomyopathy: Secondary | ICD-10-CM

## 2012-07-22 DIAGNOSIS — I2789 Other specified pulmonary heart diseases: Secondary | ICD-10-CM

## 2012-07-22 DIAGNOSIS — I4891 Unspecified atrial fibrillation: Secondary | ICD-10-CM | POA: Insufficient documentation

## 2012-07-22 DIAGNOSIS — I2510 Atherosclerotic heart disease of native coronary artery without angina pectoris: Secondary | ICD-10-CM

## 2012-07-22 DIAGNOSIS — G4733 Obstructive sleep apnea (adult) (pediatric): Secondary | ICD-10-CM | POA: Insufficient documentation

## 2012-07-22 DIAGNOSIS — I10 Essential (primary) hypertension: Secondary | ICD-10-CM | POA: Insufficient documentation

## 2012-07-22 DIAGNOSIS — I509 Heart failure, unspecified: Secondary | ICD-10-CM | POA: Insufficient documentation

## 2012-07-22 HISTORY — DX: Obstructive sleep apnea (adult) (pediatric): G47.33

## 2012-07-22 HISTORY — DX: Shortness of breath: R06.02

## 2012-07-22 HISTORY — DX: Presence of automatic (implantable) cardiac defibrillator: Z95.810

## 2012-07-22 HISTORY — DX: Dependence on other enabling machines and devices: Z99.89

## 2012-07-22 HISTORY — DX: Angina pectoris, unspecified: I20.9

## 2012-07-22 HISTORY — PX: CARDIAC DEFIBRILLATOR PLACEMENT: SHX171

## 2012-07-22 HISTORY — PX: IMPLANTABLE CARDIOVERTER DEFIBRILLATOR IMPLANT: SHX5473

## 2012-07-22 HISTORY — DX: Pure hypercholesterolemia, unspecified: E78.00

## 2012-07-22 SURGERY — IMPLANTABLE CARDIOVERTER DEFIBRILLATOR IMPLANT
Anesthesia: LOCAL

## 2012-07-22 MED ORDER — MUPIROCIN 2 % EX OINT
TOPICAL_OINTMENT | Freq: Two times a day (BID) | CUTANEOUS | Status: DC
Start: 2012-07-22 — End: 2012-07-22
  Administered 2012-07-22: 1 via NASAL
  Filled 2012-07-22: qty 22

## 2012-07-22 MED ORDER — ACETAMINOPHEN 325 MG PO TABS
325.0000 mg | ORAL_TABLET | ORAL | Status: DC | PRN
  Administered 2012-07-22: 16:00:00 325 mg via ORAL
  Administered 2012-07-22: 650 mg via ORAL
  Filled 2012-07-22 (×2): qty 2

## 2012-07-22 MED ORDER — POTASSIUM CHLORIDE CRYS ER 20 MEQ PO TBCR
20.0000 meq | EXTENDED_RELEASE_TABLET | Freq: Every day | ORAL | Status: DC
  Administered 2012-07-22: 20 meq via ORAL
  Filled 2012-07-22 (×2): qty 1

## 2012-07-22 MED ORDER — CEFAZOLIN SODIUM 1-5 GM-% IV SOLN
1.0000 g | Freq: Four times a day (QID) | INTRAVENOUS | Status: AC
  Administered 2012-07-22 – 2012-07-23 (×3): 1 g via INTRAVENOUS
  Filled 2012-07-22 (×4): qty 50

## 2012-07-22 MED ORDER — CHLORHEXIDINE GLUCONATE 4 % EX LIQD
60.0000 mL | Freq: Once | CUTANEOUS | Status: DC
  Filled 2012-07-22: qty 60

## 2012-07-22 MED ORDER — MIDAZOLAM HCL 5 MG/5ML IJ SOLN
INTRAMUSCULAR | Status: AC
  Filled 2012-07-22: qty 5

## 2012-07-22 MED ORDER — HEPARIN (PORCINE) IN NACL 2-0.9 UNIT/ML-% IJ SOLN
INTRAMUSCULAR | Status: AC
  Filled 2012-07-22: qty 500

## 2012-07-22 MED ORDER — FUROSEMIDE 40 MG PO TABS
60.0000 mg | ORAL_TABLET | Freq: Two times a day (BID) | ORAL | Status: DC
  Administered 2012-07-22: 60 mg via ORAL
  Filled 2012-07-22 (×4): qty 1

## 2012-07-22 MED ORDER — AMIODARONE HCL 200 MG PO TABS
200.0000 mg | ORAL_TABLET | Freq: Every day | ORAL | Status: DC
  Administered 2012-07-22 – 2012-07-23 (×2): 200 mg via ORAL
  Filled 2012-07-22 (×2): qty 1

## 2012-07-22 MED ORDER — LIDOCAINE HCL (PF) 1 % IJ SOLN
INTRAMUSCULAR | Status: AC
  Filled 2012-07-22: qty 60

## 2012-07-22 MED ORDER — ONDANSETRON HCL 4 MG/2ML IJ SOLN: 4.0000 mg | Freq: Four times a day (QID) | INTRAMUSCULAR | Status: DC | PRN

## 2012-07-22 MED ORDER — CARVEDILOL 6.25 MG PO TABS
18.7500 mg | ORAL_TABLET | Freq: Two times a day (BID) | ORAL | Status: DC
  Administered 2012-07-22 – 2012-07-23 (×2): 18.75 mg via ORAL
  Filled 2012-07-22 (×4): qty 1

## 2012-07-22 MED ORDER — LOSARTAN POTASSIUM 50 MG PO TABS
100.0000 mg | ORAL_TABLET | Freq: Every day | ORAL | Status: DC
  Administered 2012-07-22 – 2012-07-23 (×2): 100 mg via ORAL
  Filled 2012-07-22 (×2): qty 2

## 2012-07-22 MED ORDER — SODIUM CHLORIDE 0.9 % IV SOLN
INTRAVENOUS | Status: DC
  Administered 2012-07-22: 06:00:00 via INTRAVENOUS

## 2012-07-22 MED ORDER — FENTANYL CITRATE 0.05 MG/ML IJ SOLN
INTRAMUSCULAR | Status: AC
  Filled 2012-07-22: qty 2

## 2012-07-22 MED ORDER — MUPIROCIN 2 % EX OINT
TOPICAL_OINTMENT | CUTANEOUS | Status: AC
  Filled 2012-07-22: qty 22

## 2012-07-22 NOTE — Op Note (Signed)
ICD implant via the left subclavian vein without immediate complication. W#[id number].

## 2012-07-22 NOTE — Op Note (Signed)
Erin [name], Erin [name]              ACCOUNT NO.:  [id number]  MEDICAL RECORD NO.:  [id number]  LOCATION:  [facility]                         FACILITY:  [facility]  PHYSICIAN:  Doylene Canning. Ladona Ridgel, MD    DATE OF BIRTH:  [birth year]  DATE OF PROCEDURE:  07/22/2012 DATE OF DISCHARGE:                              OPERATIVE REPORT   PROCEDURE PERFORMED:  Insertion of a dual-chamber ICD.  INDICATION:  Nonischemic cardiomyopathy, longstanding with chronic systolic heart failure, ejection fraction 25% despite maximal medical therapy with losartan, carvedilol, and diuretics.  INTRODUCTION:  The patient is a 67 year old woman who has had a long- standing nonischemic cardiomyopathy with chronic systolic heart failure. Her ejection fraction was 25%.  In 2007, her ejection fraction was 10- 20%.  She is on maximal medical therapy.  She is now referred for ICD insertion for primary prevention of malignant cardiac arrhythmias.  PROCEDURE:  After informed consent was obtained, the patient was taken to the diagnostic EP lab in a fasting state.  After usual preparation and draping, intravenous fentanyl and midazolam was given for sedation. A 30 mL of lidocaine was infiltrated into the left infraclavicular region.  A 6-cm incision was carried out over this region. Electrocautery was utilized to dissect down to the fascial plane.  The left subclavian vein was punctured x2, and the Medtronic model 6935, 58 cm active fixation defibrillation lead, serial #[id number] was advanced to the right ventricle and the Medtronic model 5076, 45 cm active fixation pacing lead, serial #[id number] was advanced into the right atrium.  Mapping was carried out in the right ventricle.  At the final site, the R-waves were 15 mV, the pacing impedance was 600 ohms and threshold 0.5 V at 0.5 milliseconds.  A 10 V pacing did not stimulate the diaphragm.  With the ventricular lead in satisfactory position, attention was then turned to  placement of the atrial lead, which was placed in anterolateral portion of the right atrium where P-waves measured 3 mV.  The pacing impedance was 650 ohms and the threshold was 0.8 V at 0.5 milliseconds.  With these satisfactory parameters, the leads were secured to the subpectoralis fascia with a figure-of-eight silk suture.  The sewing sleeve was secured with silk suture. Electrocautery was utilized to make a subcutaneous pocket.  Antibiotic irrigation was utilized to irrigate the pocket, and electrocautery was utilized to assure hemostasis.  The Medtronic Evera XT DR dual-chamber ICD, serial #[id number] was connected to the atrial and ventricular leads and placed back in the subcutaneous pocket.  The pocket was secured with silk suture.  Antibiotic irrigation was utilized to irrigate the pocket.  The incision was closed with 2-0 and 3-0 Vicryl. Benzoin and Steri-Strips were painted on the skin.  At this point, I scrubbed out of the case to supervise defibrillation threshold testing.  After the patient was more deeply sedated with fentanyl and Versed, ventricular fibrillation was induced with a T-wave shock.  A 20-joule shock was then delivered, which terminated ventricular fibrillation and restored sinus rhythm.  At this point, no additional defibrillation threshold testing was carried out, and the patient was returned to the recovery area in satisfactory condition.  COMPLICATIONS:  There were no immediate procedure complications.  RESULTS:  Demonstrate successful implantation of a Medtronic dual- chamber ICD in a patient with longstanding nonischemic cardiomyopathy, chronic systolic heart failure, and paroxysmal atrial fibrillation.     Doylene Canning. Ladona Ridgel, MD     GWT/MEDQ  D:  07/22/2012  T:  07/22/2012  Job:  [id number]

## 2012-07-22 NOTE — H&P (Signed)
  ICD Criteria  Current LVEF (within 6 months):25%  NYHA Functional Classification: Class II  Heart Failure History:  Yes, Duration of heart failure since onset is > 9 months  Non-Ischemic Dilated Cardiomyopathy History:  Yes, timeframe is > 9 months  Atrial Fibrillation/Atrial Flutter:  No.  Ventricular Tachycardia History:  No.  Cardiac Arrest History:  No  History of Syndromes with Risk of Sudden Death:  No.  Previous ICD:  No.  Electrophysiology Study: No.  Anticoagulation Therapy:  Patient is NOT on anticoagulation therapy.   Beta Blocker Therapy:  Yes.   Ace Inhibitor/ARB Therapy:  Yes.

## 2012-07-22 NOTE — H&P (Signed)
HPI Ms. Hallmon is referred today for consideration for ICD implant. She is a pleasant 67 yo woman with longstanding chronic systolic CHF. Her EF was 35% in 2003, 10-20% in 2007 and 25% in May of 2014. She also has PAF and has been maintained in NSR on amiodarone. She has had chronic and stable class 2 CHF. She has never had syncope. She has been on maximal medical therapy. She denies chest pain.   No Known Allergies      Current Outpatient Prescriptions   Medication  Sig  Dispense  Refill   .  aspirin 81 MG tablet  Take 81 mg by mouth daily.         .  carvedilol (COREG) 12.5 MG tablet  Take 1.5 tablets (18.75 mg total) by mouth 2 (two) times daily with a meal.   60 tablet   6   .  furosemide (LASIX) 20 MG tablet  TAKE THREE TABLETS BY MOUTH TWICE DAILY   270 tablet   2   .  HYDROcodone-acetaminophen (NORCO/VICODIN) 5-325 MG per tablet  Take 2 tablets by mouth every 4 (four) hours as needed for pain.   16 tablet   0   .  losartan (COZAAR) 100 MG tablet  Take 1 tablet (100 mg total) by mouth daily.   90 tablet   3   .  potassium chloride SA (KLOR-CON M20) 20 MEQ tablet  TAKE ONE TABLET BY MOUTH EVERY DAY   30 tablet   4   .  traMADol (ULTRAM) 50 MG tablet  Take 50 mg by mouth every 6 (six) hours as needed for pain.         Marland Kitchen  amiodarone (PACERONE) 200 MG tablet  Take one tablet by mouth once daily             No current facility-administered medications for this visit.           Past Medical History   Diagnosis  Date   .  Hypertension     .  CHF (congestive heart failure)     .  OBSTRUCTIVE SLEEP APNEA     .  Atrial fibrillation     .  Encounter for long-term (current) use of anticoagulants          ROS:    All systems reviewed and negative except as noted in the HPI.      Past Surgical History   Procedure  Laterality  Date   .  Abdominal hysterectomy              No family history on file.      History       Social History   .   Marital Status:  Single       Spouse Name:  N/A       Number of Children:  N/A   .  Years of Education:  N/A       Occupational History   .  Not on file.       Social History Main Topics   .  Smoking status:  Never Smoker    .  Smokeless tobacco:  Not on file   .  Alcohol Use:  No   .  Drug Use:  No   .  Sexually Active:         Other Topics  Concern   .  Not on file  Social History Narrative   .  No narrative on file          BP 142/80  Pulse 81  Ht 5' (1.524 m)  Wt 199 lb (90.266 kg)  BMI 38.86 kg/m2   Physical Exam:   Obese appearing middle aged woman, NAD HEENT: Unremarkable Neck:  7 cm JVD, no thyromegally Lungs:  Clear with no wheezes, rales, or rhonchi HEART:  Regular rate rhythm, no murmurs, no rubs, no clicks Abd:  soft, positive bowel sounds, no organomegally, no rebound, no guarding Ext:  2 plus pulses, no edema, no cyanosis, no clubbing Skin:  No rashes no nodules Neuro:  CN II through XII intact, motor grossly intact   EKG -NSR with nonspecific T wave abnormality   Assess/Plan:         CONGESTIVE HEART FAILURE, CHRONIC - Marinus Maw, MD at 06/16/2012  9:04 PM    Status: Written Related Problem: CONGESTIVE HEART FAILURE, CHRONIC           SHe has chronic systolic CHF, and longstanding LV dysfunction with 2D echo several weeks ago demonstrating severe LV dysfunction with an EF 25%.  She is taking maximal medical therapy with Losartan and Carvedilol along with a diuretic.  I have discussed the treatment options with the patient and she wises to proceed. Her ICD will be scheduled to be implanted in several weeks.         ATRIAL FIBRILLATION - Marinus Maw, MD at 06/16/2012  9:06 PM    Status: Written Related Problem: ATRIAL FIBRILLATION           She is maintaining NSR and I have asked the patient to decrease her amiodarone to 200 mg daily. Will follow. It is unclear to me why she is not on chronic anti-coagulation. We will  research this.   Yancarlos Berthold,M.D.                         Patient Instructions History Recorded          Previous Visit      Provider Department Encounter #    05/15/2012 12:45 PM Lewayne Bunting, MD [facility] [id number]

## 2012-07-23 ENCOUNTER — Ambulatory Visit (HOSPITAL_COMMUNITY): Payer: Medicare Other

## 2012-07-23 DIAGNOSIS — I428 Other cardiomyopathies: Secondary | ICD-10-CM

## 2012-07-23 DIAGNOSIS — Z95810 Presence of automatic (implantable) cardiac defibrillator: Secondary | ICD-10-CM

## 2012-07-23 NOTE — Progress Notes (Signed)
Assessment of non adhesive dressing left upper chest unchanged except a blister was noted at the right mid edge of the dressing. Notified [employer] PA and she assessed the site. Dressing removed carefully and blister opened. Applied a silicone dressing to area after discussed with [employer] PA. Nehemiah Settle gave patient and daughter instructions regarding care of site.

## 2012-07-23 NOTE — Progress Notes (Addendum)
Patient: Erin [name] Date of Encounter: 07/23/2012, 8:38 AM Admit date: 07/22/2012     Subjective  Erin [name] reports mild incisional soreness. She denies CP, SOB or palpitations.   Objective  Physical Exam: Vitals: BP 127/68  Pulse 67  Temp(Src) 97.6 F (36.4 C) (Oral)  Resp 18  Ht 5' (1.524 m)  Wt 195 lb 12.3 oz (88.8 kg)  BMI 38.23 kg/m2  SpO2 95% General: Well developed, well appearing 66 year old female in no acute distress. Neck: Supple. JVD not elevated. Lungs: Clear bilaterally to auscultation without wheezes, rales, or rhonchi. Breathing is unlabored. Heart: RRR S1 S2 without murmurs, rubs, or gallops.  Abdomen: Soft, non-distended. Extremities: No clubbing or cyanosis. No edema.  Distal pedal pulses are 2+ and equal bilaterally. Neuro: Alert and oriented X 3. Moves all extremities spontaneously. No focal deficits. Skin: Left upper chest/implant site intact without bleeding or hematoma.  Intake/Output:  Intake/Output Summary (Last 24 hours) at 07/23/12 0838 Last data filed at 07/23/12 6213  Gross per 24 hour  Intake    770 ml  Output   1200 ml  Net   -430 ml    Inpatient Medications:  . amiodarone  200 mg Oral Daily  . carvedilol  18.75 mg Oral BID WC  . furosemide  60 mg Oral BID  . losartan  100 mg Oral Daily  . potassium chloride SA  20 mEq Oral Daily    Labs: CBC    Component Value Date/Time   WBC 5.8 07/15/2012 1040   RBC 5.11 07/15/2012 1040   HGB 12.2 07/15/2012 1040   HCT 37.7 07/15/2012 1040   PLT 352.0 07/15/2012 1040   MCV 73.7* 07/15/2012 1040   MCHC 32.5 07/15/2012 1040   RDW 22.5* 07/15/2012 1040   LYMPHSABS 2.0 07/15/2012 1040   MONOABS 0.8 07/15/2012 1040   EOSABS 0.8* 07/15/2012 1040   BASOSABS 0.1 07/15/2012 1040   BMET    Component Value Date/Time   NA 139 07/15/2012 1040   K 4.7 07/15/2012 1040   CL 104 07/15/2012 1040   CO2 29 07/15/2012 1040   GLUCOSE 90 07/15/2012 1040   BUN 16 07/15/2012 1040   CREATININE 1.1 07/15/2012  1040   CALCIUM 9.2 07/15/2012 1040   GFRNONAA 50* 04/06/2009 0009   GFRAA  Value: >60        The eGFR has been calculated using the MDRD equation. This calculation has not been validated in all clinical situations. eGFR's persistently <60 mL/min signify possible Chronic Kidney Disease. 04/06/2009 0009    Radiology/Studies: Dg Chest 2 View  07/23/2012   *RADIOLOGY REPORT*  Clinical Data: Post device placement  CHEST - 2 VIEW  Comparison: Chest radiograph 04/24/2012  Findings: Left-sided pacemaker with two continuous leads overlies stable enlarged heart silhouette.  No pneumothorax.  No pulmonary edema or pleural fluid.  IMPRESSION: No complication following left-sided pacer placement.    Original Report Authenticated By: Genevive Bi, M.D.    Device interrogation: performed by industry this AM - shows normal PPM function with stable lead measurements  Telemetry: SR; no arrhythmias    Assessment and Plan  1. NICM, EF 25% s/p dual chamber ICD implant yesterday 2. Chronic systolic HF 3. PAF 4. OSA 5. HTN  Erin [name] is doing well post ICD implant. Device interrogation shows normal ICD function. CXR shows stable lead placement without PTX. Wound intact without bleeding or hematoma. Reviewed discharge instructions, wound care and follow-up. DC home today. Wound check  in one week.  Dr. Purvis Sheffield to see Signed, Rick Duff PA-C   The patient was seen and examined, and I agree with the assessment and plan as documented above. Erin [name] is doing well with some mild soreness at the implant site. Her device has been interrogated and is functioning appropriately, and her CXR shows good lead placement with no evidence of a pneumothorax. She will d/c to home today.

## 2012-07-23 NOTE — Discharge Summary (Signed)
CARDIOLOGY DISCHARGE SUMMARY    Patient ID: Erin [name],  MRN: [id number], DOB/AGE: 67-Sep-1947 67 y.o.  Admit date: 07/22/2012 Discharge date: 07/23/2012  Primary Cardiologist / EP: Eden Emms, MD / Ladona Ridgel, MD  Primary Discharge Diagnosis:  1. NICM, EF 25% s/p ICD implant for primary prevention SCD  Secondary Discharge Diagnoses:  1. Chronic systolic HF 2. PAF 3. HTN 4. OSA  Procedures This Admission:  1. Dual chamber ICD implantation 07/22/2012 RA lead - Medtronic model 5076, 45 cm active fixation pacing lead, serial #[id number]  RV lead - Medtronic model 6935, 58 cm active fixation defibrillation lead serial #[id number] Device - Medtronic Evera XT DR dual-chamber ICD, serial #[id number]    History and Hospital Course:  Erin [name] is a 67 year old woman who has had a long-standing nonischemic cardiomyopathy with chronic systolic heart failure. Her ejection fraction was 25%. In 2007, her ejection fraction was 10-20%. She is on maximal medical therapy. She presented yesterday for ICD insertion for primary prevention of malignant cardiac arrhythmias. Erin [name] tolerated this procedure well without any immediate complication. She remains hemodynamically stable and afebrile. Her chest xray shows stable lead placement without pneumothorax. Her device interrogation shows normal ICD function with stable lead parameters/measurements. Her implant site is intact without significant bleeding or hematoma. She has been given discharge instructions including wound care and activity restrictions. She will follow-up in 10 days for wound check. There were no changes made to her medications. She has been seen, examined and deemed stable for discharge today by Dr. Prentice Docker.  Discharge Vitals: Blood pressure 127/68, pulse 67, temperature 97.6 F (36.4 C), temperature source Oral, resp. rate 18, height 5' (1.524 m), weight 195 lb 12.3 oz (88.8 kg), SpO2 95.00%.   Labs: Lab Results    Component Value Date   WBC 5.8 07/15/2012   HGB 12.2 07/15/2012   HCT 37.7 07/15/2012   MCV 73.7* 07/15/2012   PLT 352.0 07/15/2012   BMET    Component Value Date/Time   NA 139 07/15/2012 1040   K 4.7 07/15/2012 1040   CL 104 07/15/2012 1040   CO2 29 07/15/2012 1040   GLUCOSE 90 07/15/2012 1040   BUN 16 07/15/2012 1040   CREATININE 1.1 07/15/2012 1040   CALCIUM 9.2 07/15/2012 1040   GFRNONAA 50* 04/06/2009 0009   GFRAA  Value: >60        The eGFR has been calculated using the MDRD equation. This calculation has not been validated in all clinical situations. eGFR's persistently <60 mL/min signify possible Chronic Kidney Disease. 04/06/2009 0009    Disposition:  The patient is being discharged in stable condition.  Follow-up: Follow-up Information   Follow up with [facility] On 08/01/2012. (At 4:30 PM for wound check)    Contact information:   [address] [place] [address] [phone]      Follow up with [facility] On 08/24/2012. (At 10:40 AM for HF follow-up)    Contact information:   [address] [place] [address] [phone]     Discharge Medications:    Medication List         amiodarone 200 MG tablet  Commonly known as:  PACERONE  Take 200 mg by mouth daily.     aspirin 81 MG tablet  Take 81 mg by mouth daily.     carvedilol 12.5 MG tablet  Commonly known as:  COREG  Take 1.5 tablets (18.75 mg total)  by mouth 2 (two) times daily with a meal.     furosemide 20 MG tablet  Commonly known as:  LASIX  Take 60 mg by mouth 2 (two) times daily.     losartan 100 MG tablet  Commonly known as:  COZAAR  Take 1 tablet (100 mg total) by mouth daily.     potassium chloride SA 20 MEQ tablet  Commonly known as:  K-DUR,KLOR-CON  Take 20 mEq by mouth daily.       Duration of Discharge Encounter: Greater than 30 minutes including physician time.  Signed, Rick Duff, PA-C 07/23/2012,  12:55 PM

## 2012-08-01 ENCOUNTER — Ambulatory Visit (INDEPENDENT_AMBULATORY_CARE_PROVIDER_SITE_OTHER): Payer: Medicare Other | Admitting: *Deleted

## 2012-08-01 ENCOUNTER — Encounter: Payer: Self-pay | Admitting: Internal Medicine

## 2012-08-01 DIAGNOSIS — I428 Other cardiomyopathies: Secondary | ICD-10-CM

## 2012-08-01 DIAGNOSIS — I420 Dilated cardiomyopathy: Secondary | ICD-10-CM

## 2012-08-01 DIAGNOSIS — I509 Heart failure, unspecified: Secondary | ICD-10-CM

## 2012-08-01 LAB — ICD DEVICE OBSERVATION
AL AMPLITUDE: 4.1 mv
AL IMPEDENCE ICD: 494 Ohm
HV IMPEDENCE: 56 Ohm
RV LEAD IMPEDENCE ICD: 437 Ohm

## 2012-08-01 NOTE — Progress Notes (Signed)
Wound check-ICD in office. 

## 2012-08-19 ENCOUNTER — Encounter: Payer: Self-pay | Admitting: Cardiovascular Disease

## 2012-08-19 ENCOUNTER — Ambulatory Visit (INDEPENDENT_AMBULATORY_CARE_PROVIDER_SITE_OTHER): Payer: Medicare Other | Admitting: Cardiovascular Disease

## 2012-08-19 VITALS — BP 142/88 | HR 78 | Ht 60.0 in | Wt 199.8 lb

## 2012-08-19 DIAGNOSIS — I2510 Atherosclerotic heart disease of native coronary artery without angina pectoris: Secondary | ICD-10-CM

## 2012-08-19 DIAGNOSIS — G4733 Obstructive sleep apnea (adult) (pediatric): Secondary | ICD-10-CM

## 2012-08-19 DIAGNOSIS — I509 Heart failure, unspecified: Secondary | ICD-10-CM

## 2012-08-19 NOTE — Assessment & Plan Note (Signed)
Stable with no angina and good activity level.  Continue medical Rx  

## 2012-08-19 NOTE — Assessment & Plan Note (Signed)
Compliance has been an issue  She needs a new CPAP mask  Have called Dr Teddy Spike office to arrange with [facility]

## 2012-08-19 NOTE — Assessment & Plan Note (Signed)
Functional Class one S/P biV on good medical Rx Euvolemic.  Has f/u in CHF clinic as well

## 2012-08-19 NOTE — Patient Instructions (Signed)
Your physician recommends that you schedule a follow-up appointment in:   3 MONTHS  WITH DR [facility] Your physician recommends that you continue on your current medications as directed. Please refer to the Current Medication list given to you today. APPOINTMENT  WITH  DR [facility]    09-20-12 AT   2:30  AND MAY CALL  TO SEE IF  THERE HAS BEEN A CANCELLATION [phone]

## 2012-08-19 NOTE — Progress Notes (Signed)
Patient ID: Erin [name], female   DOB: 1945/12/27, 67 y.o.   MRN: [id number] Mrs. Illingworth is a 67 y.o. female with prior history of NICM/dilated cardiomyopathy with EF 25%, HTN and OSA with CPAP (noncompliant).  Last cath in 08/2001 showed normal left main, 50% in the LAD, normal circumflex,  normal RCA.  Echo on 05/06/12 showing LVEF 25% with diffuse hypokinesis. Mod dilated LA. Mildly dilated RA. PAPP 38 mmHg.   7/16 had BiV AICD placed by Dr Ladona Ridgel for primary prevention.    Has established with CHF clinic  Needs new CPAP mask Sees [facility]  ROS: Denies fever, malais, weight loss, blurry vision, decreased visual acuity, cough, sputum, SOB, hemoptysis, pleuritic pain, palpitaitons, heartburn, abdominal pain, melena, lower extremity edema, claudication, or rash.  All other systems reviewed and negative  General: Affect appropriate [place] black female HEENT: normal Neck supple with no adenopathy JVP normal no bruits no thyromegaly Lungs clear with no wheezing and good diaphragmatic motion Heart:  S1/S2 no murmur, no rub, gallop or click PMI normal Abdomen: benighn, BS positve, no tenderness, no AAA no bruit.  No HSM or HJR Distal pulses intact with no bruits No edema Neuro non-focal Skin warm and dry No muscular weakness   Current Outpatient Prescriptions  Medication Sig Dispense Refill  . amiodarone (PACERONE) 200 MG tablet Take 200 mg by mouth daily.       Marland Kitchen aspirin 81 MG tablet Take 81 mg by mouth daily.      . carvedilol (COREG) 12.5 MG tablet Take 1.5 tablets (18.75 mg total) by mouth 2 (two) times daily with a meal.  60 tablet  6  . furosemide (LASIX) 20 MG tablet Take 60 mg by mouth 2 (two) times daily.      Marland Kitchen losartan (COZAAR) 100 MG tablet Take 1 tablet (100 mg total) by mouth daily.  90 tablet  3  . potassium chloride SA (K-DUR,KLOR-CON) 20 MEQ tablet Take 20 mEq by mouth daily.       No current facility-administered medications for this visit.     Allergies  Review of patient's allergies indicates no known allergies.  Electrocardiogram: 07/18/12 SR rate 66 low voltage nonspecific T wave changes   Assessment and Plan

## 2012-08-24 ENCOUNTER — Ambulatory Visit (HOSPITAL_COMMUNITY)
Admission: RE | Admit: 2012-08-24 | Discharge: 2012-08-24 | Disposition: A | Discharge status: Home / Self Care | Payer: Medicare Other | Source: Ambulatory Visit | Attending: Internal Medicine | Admitting: Internal Medicine

## 2012-08-24 ENCOUNTER — Encounter (HOSPITAL_COMMUNITY): Payer: Self-pay

## 2012-08-24 VITALS — BP 118/72 | HR 80 | Ht 60.0 in | Wt 200.4 lb

## 2012-08-24 DIAGNOSIS — Z7901 Long term (current) use of anticoagulants: Secondary | ICD-10-CM | POA: Insufficient documentation

## 2012-08-24 DIAGNOSIS — I5022 Chronic systolic (congestive) heart failure: Secondary | ICD-10-CM | POA: Insufficient documentation

## 2012-08-24 DIAGNOSIS — G4733 Obstructive sleep apnea (adult) (pediatric): Secondary | ICD-10-CM | POA: Insufficient documentation

## 2012-08-24 DIAGNOSIS — Z79899 Other long term (current) drug therapy: Secondary | ICD-10-CM | POA: Insufficient documentation

## 2012-08-24 DIAGNOSIS — I509 Heart failure, unspecified: Secondary | ICD-10-CM

## 2012-08-24 DIAGNOSIS — I10 Essential (primary) hypertension: Secondary | ICD-10-CM | POA: Insufficient documentation

## 2012-08-24 DIAGNOSIS — Z95810 Presence of automatic (implantable) cardiac defibrillator: Secondary | ICD-10-CM | POA: Insufficient documentation

## 2012-08-24 MED ORDER — SPIRONOLACTONE 25 MG PO TABS: 12.5000 mg | ORAL_TABLET | Freq: Every day | ORAL | Status: DC

## 2012-08-24 NOTE — Progress Notes (Addendum)
Patient ID: Erin [name], female   DOB: March 13, 1945, 67 y.o.   MRN: [id number] Optivol Medtronic ICD- Evera XT   Primary Cardiologist: Dr. Eden Emms   HPI: Erin [name] is a 67 y.o. female with prior history of NICM/dilated cardiomyopathy with EF 25%, s/p ICD implant Evera XT, HTN and OSA with CPAP (noncompliant).    Last cath in 08/2001 showed normal left main, 50% in the LAD, normal circumflex, normal RCA.  Echo on 05/06/12 showing LVEF 25% with diffuse hypokinesis.  Mod dilated LA.  Mildly dilated RA. PAPP 38 mmHg.    Labs: K+ 4.7, Cr 1.1, BUN 16  Follow up: Last visit increased carvedilol to 18.75 mg BID. She saw Dr. Ladona Ridgel and amiodarone was decreased 200 mg daily. She also received an ICD on 7/18. Overall feeling pretty well. Scale broken at home. Denies orthopnea, CP, or edema. + SOB with moderated exertion. CPAP mask broken and going to follow up with Dr. Shelle Iron to get a new one.   Lives with daughter and her family.  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  ROS: All systems negative except as listed in HPI, PMH and Problem List.  Past Medical History  Diagnosis Date  . Hypertension   . CHF (congestive heart failure)   . Atrial fibrillation   . Encounter for long-term (current) use of anticoagulants   . Automatic implantable cardioverter-defibrillator in situ   . High cholesterol   . Anginal pain   . Exertional shortness of breath   . OSA on CPAP     "suppose to; not often" (07/22/2012)    Current Outpatient Prescriptions  Medication Sig Dispense Refill  . amiodarone (PACERONE) 200 MG tablet Take 200 mg by mouth daily.       Marland Kitchen aspirin 81 MG tablet Take 81 mg by mouth daily.      . carvedilol (COREG) 12.5 MG tablet Take 1.5 tablets (18.75 mg total) by mouth 2 (two) times daily with a meal.  60 tablet  6  . furosemide (LASIX) 20 MG tablet Take 60 mg by mouth 2 (two) times daily.      Marland Kitchen losartan (COZAAR) 100 MG tablet Take 1 tablet (100 mg total) by mouth daily.  90  tablet  3  . potassium chloride SA (K-DUR,KLOR-CON) 20 MEQ tablet Take 20 mEq by mouth daily.       No current facility-administered medications for this encounter.   PHYSICAL EXAM: Filed Vitals:   08/24/12 1054  BP: 118/72  Pulse: 80  Height: 5' (1.524 m)  Weight: 200 lb 6.4 oz (90.901 kg)  SpO2: 86%  Last weight: 199  General:  Obese, Well appearing. No resp difficulty HEENT: normal Neck: supple. JVP flat. Carotids 2+ bilaterally; no bruits. No lymphadenopathy or thryomegaly appreciated. Cor: PMI normal. Regular rate & rhythm. No rubs, gallops or murmurs. Lungs: clear Abdomen: soft, nontender, nondistended. No hepatosplenomegaly. No bruits or masses. Good bowel sounds. Extremities: no cyanosis, clubbing, rash, trace edema Neuro: alert & orientedx3, cranial nerves grossly intact. Moves all 4 extremities w/o difficulty. Affect pleasant.   ASSESSMENT & PLAN:  1) Chronic systolic HF, EF 25% (05/2012), s/p ICD implant - NYHA II/IIb symptoms. Volume status good.  - Will stop potassium daily and start spiro 12.5 mg. Check BMET next week at [facility]. -Continue on coreg 18.75 mg BID and losartan 100 mg daily. - Reinforced the need and importance of daily weights, a low sodium diet, and fluid restriction (less than 2 L  a day). Instructed to call the HF clinic if weight increases more than 3 lbs overnight or 5 lbs in a week.  - F/U 6 weeks  2) OSA - Patient not currently wearing CPAP, d/t face mask broken. She has an appt with Dr. Shelle Iron set up and discussed the need to wear mask once replaced.  3) HTN - Stable, continue BB and ARB.   Ulla Potash B NP-C 12:16 PM

## 2012-08-24 NOTE — Addendum Note (Signed)
Encounter addended by: Aundria Rud, NP on: 08/24/2012 12:19 PM<BR>     Documentation filed: Notes Section

## 2012-08-24 NOTE — Patient Instructions (Addendum)
Stop daily potassium  Start Spironolactone 12.5 mg daily.  Get labs next Wednesday at [place].  Follow up 6 weeks

## 2012-08-31 ENCOUNTER — Other Ambulatory Visit (INDEPENDENT_AMBULATORY_CARE_PROVIDER_SITE_OTHER): Payer: Medicare Other

## 2012-08-31 ENCOUNTER — Telehealth (HOSPITAL_COMMUNITY): Payer: Self-pay | Admitting: *Deleted

## 2012-08-31 DIAGNOSIS — I509 Heart failure, unspecified: Secondary | ICD-10-CM

## 2012-08-31 DIAGNOSIS — E876 Hypokalemia: Secondary | ICD-10-CM

## 2012-08-31 LAB — BASIC METABOLIC PANEL
CO2: 26 mEq/L (ref 19–32)
Calcium: 9 mg/dL (ref 8.4–10.5)
Chloride: 104 mEq/L (ref 96–112)
Glucose, Bld: 94 mg/dL (ref 70–99)
Potassium: 3.5 mEq/L (ref 3.5–5.1)
Sodium: 136 mEq/L (ref 135–145)

## 2012-08-31 MED ORDER — SPIRONOLACTONE 25 MG PO TABS: 25.0000 mg | ORAL_TABLET | Freq: Every day | ORAL | Status: DC

## 2012-08-31 NOTE — Telephone Encounter (Signed)
Message copied by Noralee Space on Wed Aug 31, 2012  4:59 PM ------      Message from: Arvilla Meres R      Created: Wed Aug 31, 2012  4:08 PM       K low. Increase spiro to 25 daily. ------

## 2012-09-08 ENCOUNTER — Other Ambulatory Visit (INDEPENDENT_AMBULATORY_CARE_PROVIDER_SITE_OTHER): Payer: Medicare Other

## 2012-09-08 DIAGNOSIS — E876 Hypokalemia: Secondary | ICD-10-CM

## 2012-09-08 LAB — BASIC METABOLIC PANEL
BUN: 19 mg/dL (ref 6–23)
CO2: 27 mEq/L (ref 19–32)
Chloride: 101 mEq/L (ref 96–112)
Creatinine, Ser: 1.1 mg/dL (ref 0.4–1.2)
Glucose, Bld: 96 mg/dL (ref 70–99)

## 2012-09-13 NOTE — Telephone Encounter (Signed)
Pt aware.

## 2012-09-20 ENCOUNTER — Encounter: Payer: Self-pay | Admitting: Pulmonary Disease

## 2012-09-20 ENCOUNTER — Ambulatory Visit (INDEPENDENT_AMBULATORY_CARE_PROVIDER_SITE_OTHER): Payer: Medicare Other | Admitting: Pulmonary Disease

## 2012-09-20 ENCOUNTER — Telehealth: Payer: Self-pay | Admitting: *Deleted

## 2012-09-20 VITALS — BP 128/80 | HR 70 | Temp 97.8°F | Ht 60.0 in | Wt 202.2 lb

## 2012-09-20 DIAGNOSIS — G4731 Primary central sleep apnea: Secondary | ICD-10-CM

## 2012-09-20 DIAGNOSIS — G4739 Other sleep apnea: Secondary | ICD-10-CM

## 2012-09-20 DIAGNOSIS — G4730 Sleep apnea, unspecified: Secondary | ICD-10-CM

## 2012-09-20 MED ORDER — WARFARIN SODIUM 5 MG PO TABS: 5.0000 mg | ORAL_TABLET | Freq: Every day | ORAL | Status: DC

## 2012-09-20 NOTE — Progress Notes (Signed)
Subjective:    Patient ID: [name], female    DOB: 1945/07/07, 67 y.o.   MRN: [id number]  HPI The patient is a 67 year old female who have been asked to see regarding management of complex sleep apnea.  She was diagnosed in 2006 with severe complex apnea, with an AHI of 77 events per hour.  She underwent titration in 2007 where she required a bilevel pressure of 22/17.  She had pressure induced central apneas, but this setting balanced control of her obstructive sleep apnea with her component of central sleep apnea.  The patient was started on bilevel with this pressure, and unfortunately has not followed up since 2011.  She tells me that she is wearing bilevel off and on, and this is primarily because of mask leak issues.  She uses a full face mask, but has not tried a new mask in quite some time.  Despite not wearing her device, the patient feels that she sleeps adequately at night and feels rested in the mornings upon arising.  She denies daytime sleepiness with inactivity.  She tells me that her weight is neutral since her last visit.   Sleep Questionnaire What time do you typically go to bed?( Between what hours) 11:00 - 12:00 11:00 - 12:00 at 1449 on 09/20/12 by Hermelinda Medicus How long does it take you to fall asleep? 30 mins 30 mins at 1449 on 09/20/12 by Hermelinda Medicus How many times during the night do you wake up? 1 1 at 1449 on 09/20/12 by Hermelinda Medicus What time do you get out of bed to start your day? No Value 7:30 8:00 at 1449 on 09/20/12 by Hermelinda Medicus Do you drive or operate heavy machinery in your occupation? No No at 1449 on 09/20/12 by Hermelinda Medicus How much has your weight changed (up or down) over the past two years? (In pounds) 3 lb (1.361 kg) 3 lb (1.361 kg) at 1449 on 09/20/12 by Hermelinda Medicus Have you ever had a sleep study before? Yes Yes at 1449 on 09/20/12 by Hermelinda Medicus If yes, location of study? [facility] at 1449  on 09/20/12 by Hermelinda Medicus If yes, date of study? 2006 2006 at 1449 on 09/20/12 by Hermelinda Medicus Do you currently use CPAP? No Value CPAP owner not currently using due to mask at 1449 on 09/20/12 by Hermelinda Medicus Do you wear oxygen at any time? No No at 1449 on 09/20/12 by Hermelinda Medicus   Review of Systems  Constitutional: Positive for unexpected weight change. Negative for fever.  HENT: Positive for hearing loss and postnasal drip. Negative for ear pain, nosebleeds, congestion, sore throat, rhinorrhea, sneezing, trouble swallowing, dental problem and sinus pressure.   Eyes: Negative for redness and itching.  Respiratory: Negative for cough, chest tightness, shortness of breath and wheezing.   Cardiovascular: Negative for palpitations and leg swelling.  Gastrointestinal: Negative for nausea and vomiting.  Genitourinary: Negative for dysuria.  Musculoskeletal: Negative for joint swelling.  Skin: Negative for rash.  Neurological: Negative for headaches.  Hematological: Does not bruise/bleed easily.  Psychiatric/Behavioral: Negative for dysphoric mood. The patient is not nervous/anxious.        Objective:   Physical Exam Constitutional:  Obese female, no acute distress  HENT:  Nares patent without discharge, large turbinates noted.  Oropharynx without exudate, palate and uvula are thick and elongated.   Eyes:  Perrla, eomi, no scleral icterus  Neck:  No  JVD, no TMG  Cardiovascular:  Normal rate, regular rhythm, no rubs or gallops.  No murmurs        Intact distal pulses  Pulmonary :  Normal breath sounds, no stridor or respiratory distress   No rales, rhonchi, or wheezing  Abdominal:  Soft, nondistended, bowel sounds present.  No tenderness noted.   Musculoskeletal:  No lower extremity edema noted.  Lymph Nodes:  No cervical lymphadenopathy noted  Skin:  No cyanosis noted  Neurologic:  Alert, appropriate, moves all 4 extremities without obvious  deficit.         Assessment & Plan:

## 2012-09-20 NOTE — Telephone Encounter (Signed)
Called patient and instructed her to start coumadin 5 mg po today, she states she can pick up warfarin tomorrow, she states she has taken in past and understand anticoagulation therapy safety, I have made her appt with coumadin clinic next week for INR check.

## 2012-09-20 NOTE — Assessment & Plan Note (Signed)
The patient has a history of severe complex apnea, and is supposed to be on bilevel at a fairly high pressure.  She has not been wearing her device compliantly, primarily because of mask leaking.  I will give her over to the sleep center for formal mask fitting with some of the newer designs, and then get a download off her machine approximately 3-4 weeks later to check on compliance and control.  If she continues to have issues with pressure and control, I would consider an ASV device.  I also encouraged her to work aggressively on weight loss.

## 2012-09-20 NOTE — Patient Instructions (Addendum)
Will send you to the sleep center during the day for a mask fitting. Once you get your new mask, start back on your bilevel device. Will get a download off your machine in next 3-4 weeks so I can make sure is controlling your sleep apnea.  Work on weight loss followup with me again in 6mos, but please call if having issues with bipap tolerance.

## 2012-09-21 ENCOUNTER — Telehealth (HOSPITAL_COMMUNITY): Payer: Self-pay | Admitting: *Deleted

## 2012-09-21 DIAGNOSIS — I5022 Chronic systolic (congestive) heart failure: Secondary | ICD-10-CM

## 2012-09-21 NOTE — Telephone Encounter (Signed)
Message copied by Noralee Space on Wed Sep 21, 2012  4:50 PM ------      Message from: Arvilla Meres R      Created: Mon Sep 12, 2012 12:06 AM       K low. Spiro increased. Recheck 1 week. ------

## 2012-09-27 ENCOUNTER — Ambulatory Visit (HOSPITAL_BASED_OUTPATIENT_CLINIC_OR_DEPARTMENT_OTHER): Payer: Medicare Other | Attending: Pulmonary Disease | Admitting: Radiology

## 2012-09-27 ENCOUNTER — Other Ambulatory Visit (INDEPENDENT_AMBULATORY_CARE_PROVIDER_SITE_OTHER): Payer: Medicare Other

## 2012-09-27 DIAGNOSIS — G4731 Primary central sleep apnea: Secondary | ICD-10-CM

## 2012-09-27 DIAGNOSIS — G4733 Obstructive sleep apnea (adult) (pediatric): Secondary | ICD-10-CM

## 2012-09-27 DIAGNOSIS — I5022 Chronic systolic (congestive) heart failure: Secondary | ICD-10-CM

## 2012-09-27 LAB — BASIC METABOLIC PANEL
BUN: 25 mg/dL — ABNORMAL HIGH (ref 6–23)
Calcium: 9.1 mg/dL (ref 8.4–10.5)
Creatinine, Ser: 1.3 mg/dL — ABNORMAL HIGH (ref 0.4–1.2)
GFR: 53.42 mL/min — ABNORMAL LOW (ref >60.00)
Glucose, Bld: 90 mg/dL (ref 70–99)

## 2012-09-29 ENCOUNTER — Ambulatory Visit (HOSPITAL_COMMUNITY)
Admission: RE | Admit: 2012-09-29 | Discharge: 2012-09-29 | Disposition: A | Discharge status: Home / Self Care | Payer: Medicare Other | Source: Ambulatory Visit | Attending: Internal Medicine | Admitting: Internal Medicine

## 2012-09-29 ENCOUNTER — Encounter (HOSPITAL_COMMUNITY): Payer: Self-pay

## 2012-09-29 ENCOUNTER — Ambulatory Visit (INDEPENDENT_AMBULATORY_CARE_PROVIDER_SITE_OTHER): Payer: Medicare Other

## 2012-09-29 VITALS — BP 114/60 | HR 71 | Ht 60.0 in | Wt 199.4 lb

## 2012-09-29 DIAGNOSIS — I2789 Other specified pulmonary heart diseases: Secondary | ICD-10-CM

## 2012-09-29 DIAGNOSIS — I4891 Unspecified atrial fibrillation: Secondary | ICD-10-CM

## 2012-09-29 DIAGNOSIS — I509 Heart failure, unspecified: Secondary | ICD-10-CM | POA: Insufficient documentation

## 2012-09-29 DIAGNOSIS — Z7901 Long term (current) use of anticoagulants: Secondary | ICD-10-CM

## 2012-09-29 LAB — POCT INR: INR: 2.7

## 2012-09-29 MED ORDER — CARVEDILOL 25 MG PO TABS: 25.0000 mg | ORAL_TABLET | Freq: Two times a day (BID) | ORAL | Status: DC

## 2012-09-29 NOTE — Progress Notes (Signed)
Patient ID: Erin [name], female   DOB: June 27, 1945, 67 y.o.   MRN: [id number] Optivol Medtronic ICD  Primary Cardiologist: Dr. Eden Emms  HPI: [name] is a 67 y.o. female with prior history of NICM/dilated cardiomyopathy with EF 25%, s/p ICD Medtronic, Atrial fibrillation,  HTN and OSA with CPAP.  Last cath in 08/2001 showed normal left main, 50% in the LAD, normal circumflex, normal RCA.  Echo on 05/06/12 showing LVEF 25% with diffuse hypokinesis.  Mod dilated LA.  Mildly dilated RA. PAPP 38 mmHg.    Labs (8/14): K+ 4.8, Cr 1.3, BUN 25 Labs (9/14): K 4.6, creatinine 1.3  Follow up: Last visit stopped daily potassium and started spiro 12.5 mg daily. Followed up with Dr. Shelle Iron and got a new mask fitted, but she has not received yet. Feeling pretty good. Denies SOB, orthopnea, CP, or edema. Can walk around the grocery store without stopping. Can go up stairs. Weight at home 197-200 lbs.   SH: Lives with daughter and her family.  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  ROS: All systems negative except as listed in HPI, PMH and Problem List.  Past Medical History  Diagnosis Date  . Hypertension   . CHF (congestive heart failure)   . Atrial fibrillation   . Encounter for long-term (current) use of anticoagulants   . Automatic implantable cardioverter-defibrillator in situ   . High cholesterol   . Anginal pain   . Exertional shortness of breath   . OSA on CPAP     "suppose to; not often" (07/22/2012)  . Other "heavy-for-dates" infants     Current Outpatient Prescriptions  Medication Sig Dispense Refill  . amiodarone (PACERONE) 200 MG tablet Take 200 mg by mouth daily.       Marland Kitchen aspirin 81 MG tablet Take 81 mg by mouth daily.      . carvedilol (COREG) 12.5 MG tablet Take 1.5 tablets (18.75 mg total) by mouth 2 (two) times daily with a meal.  60 tablet  6  . furosemide (LASIX) 20 MG tablet Take 60 mg by mouth 2 (two) times daily.      Marland Kitchen losartan (COZAAR) 100 MG tablet Take 1  tablet (100 mg total) by mouth daily.  90 tablet  3  . spironolactone (ALDACTONE) 25 MG tablet Take 1 tablet (25 mg total) by mouth daily.  30 tablet  3  . warfarin (COUMADIN) 5 MG tablet Take 1 tablet (5 mg total) by mouth daily.  35 tablet  1   No current facility-administered medications for this encounter.    Filed Vitals:   09/29/12 1004  BP: 114/60  Pulse: 71  Height: 5' (1.524 m)  Weight: 199 lb 6.4 oz (90.447 kg)  SpO2: 87%  Last weight: 202 lbs  PHYSICAL EXAM: General:  Obese, Well appearing. No resp difficulty HEENT: normal Neck: supple. JVP 6-7. Carotids 2+ bilaterally; no bruits. No lymphadenopathy or thryomegaly appreciated. Cor: PMI normal. Regular rate & rhythm. No rubs, gallops or murmurs. Lungs: clear Abdomen: soft, nontender, nondistended. No hepatosplenomegaly. No bruits or masses. Good bowel sounds. Extremities: no cyanosis, clubbing, rash, trace edema Neuro: alert & orientedx3, cranial nerves grossly intact. Moves all 4 extremities w/o difficulty. Affect pleasant.  ASSESSMENT & PLAN:  1) Chronic systolic HF, EF 25% (05/2012), s/p ICD implant.  Nonischemic cardiomyopathyNYHA II symptoms. Reports doing better. Volume status good.  - Will increase coreg to 25 mg BID goal dose - Continue losartan 100 mg daily and  spiro 12.5 mg daily - BMET at followup in 2 months.  - Reinforced the need and importance of daily weights, a low sodium diet, and fluid restriction (less than 2 L a day). Instructed to call the HF clinic if weight increases more than 3 lbs overnight or 5 lbs in a week.    2) OSA: Saw pulmonology and had new mask fitted. Recommended her start wearing CPAP nightly.   3) HTN - Stable, continue BB, ARB and spiro  4) Afib: Paroxysmal, on coumadin and amiodarone.  Stop aspirin.  She will need LFTs/TSH in 2 months. Reinforced yearly eye exam.   F/U 2 months  Ulla Potash B NP-C 10:23 AM  Patient seen with NP, agree with the above note with  appropriate changes made.  Stable.  Increase Coreg to 25 mg bid, no change in diuretics.    Marca Ancona 09/30/2012

## 2012-09-29 NOTE — Patient Instructions (Signed)

## 2012-09-29 NOTE — Patient Instructions (Addendum)
Please increase coreg to 25 mg twice a day. Take 2 tablets in the am and pm until you run out of your current dose and then your new prescription will be 25 mg tablets and you will take 1 tablet in the am and 1 tablet in the pm.  Stop aspirin.  Follow up 2 months.  Wear CPAP.  Do the following things EVERYDAY: 1) Weigh yourself in the morning before breakfast. Write it down and keep it in a log. 2) Take your medicines as prescribed 3) Eat low salt foods-Limit salt (sodium) to 2000 mg per day.  4) Stay as active as you can everyday 5) Limit all fluids for the day to less than 2 liters 6)

## 2012-09-30 DIAGNOSIS — I5022 Chronic systolic (congestive) heart failure: Secondary | ICD-10-CM | POA: Insufficient documentation

## 2012-10-05 ENCOUNTER — Ambulatory Visit (INDEPENDENT_AMBULATORY_CARE_PROVIDER_SITE_OTHER): Payer: Medicare Other | Admitting: General Practice

## 2012-10-05 DIAGNOSIS — I4891 Unspecified atrial fibrillation: Secondary | ICD-10-CM

## 2012-10-05 DIAGNOSIS — I2789 Other specified pulmonary heart diseases: Secondary | ICD-10-CM

## 2012-10-05 DIAGNOSIS — Z7901 Long term (current) use of anticoagulants: Secondary | ICD-10-CM

## 2012-10-11 ENCOUNTER — Telehealth: Payer: Self-pay | Admitting: Internal Medicine

## 2012-10-11 NOTE — Telephone Encounter (Signed)
New problem  Pt called// states its nothing urgent// has questions about defib// please call. (No details)

## 2012-10-11 NOTE — Telephone Encounter (Signed)
Spoke with her and she is needing dental work done soon.  She will go ahead and make appointment and I let her know the dentist office will contact us for recommendations prior to them doing procedure She verbalized understanding

## 2012-10-12 ENCOUNTER — Ambulatory Visit (INDEPENDENT_AMBULATORY_CARE_PROVIDER_SITE_OTHER): Payer: Medicare Other | Admitting: General Practice

## 2012-10-12 DIAGNOSIS — I4891 Unspecified atrial fibrillation: Secondary | ICD-10-CM

## 2012-10-12 DIAGNOSIS — Z7901 Long term (current) use of anticoagulants: Secondary | ICD-10-CM

## 2012-10-12 DIAGNOSIS — I2789 Other specified pulmonary heart diseases: Secondary | ICD-10-CM

## 2012-10-26 ENCOUNTER — Ambulatory Visit (INDEPENDENT_AMBULATORY_CARE_PROVIDER_SITE_OTHER): Payer: Medicare Other | Admitting: General Practice

## 2012-10-26 DIAGNOSIS — Z7901 Long term (current) use of anticoagulants: Secondary | ICD-10-CM

## 2012-10-26 DIAGNOSIS — I4891 Unspecified atrial fibrillation: Secondary | ICD-10-CM

## 2012-10-26 DIAGNOSIS — I2789 Other specified pulmonary heart diseases: Secondary | ICD-10-CM

## 2012-11-01 ENCOUNTER — Encounter: Payer: Self-pay | Admitting: Internal Medicine

## 2012-11-01 ENCOUNTER — Ambulatory Visit (INDEPENDENT_AMBULATORY_CARE_PROVIDER_SITE_OTHER): Payer: Medicare Other | Admitting: Internal Medicine

## 2012-11-01 VITALS — BP 141/71 | HR 70 | Ht 60.0 in | Wt 202.0 lb

## 2012-11-01 DIAGNOSIS — I428 Other cardiomyopathies: Secondary | ICD-10-CM

## 2012-11-01 DIAGNOSIS — I5022 Chronic systolic (congestive) heart failure: Secondary | ICD-10-CM

## 2012-11-01 DIAGNOSIS — I420 Dilated cardiomyopathy: Secondary | ICD-10-CM

## 2012-11-01 DIAGNOSIS — I509 Heart failure, unspecified: Secondary | ICD-10-CM

## 2012-11-01 LAB — ICD DEVICE OBSERVATION
AL IMPEDENCE ICD: 513 Ohm
AL THRESHOLD: 0.75 V
DEV-0020ICD: NEGATIVE
RV LEAD AMPLITUDE: 17.1 mv
RV LEAD THRESHOLD: 0.5 V

## 2012-11-01 NOTE — Patient Instructions (Signed)
Your physician recommends that you schedule a follow-up appointment in: 3 months with device clinic  

## 2012-11-01 NOTE — Progress Notes (Signed)
HPI Erin [name] returns today for followup. She is a pleasant 67 yo woman with a h/o chronic systolic CHF, HTN, atrial fibrillation, s/p ICD implant approximately 4 months ago. In the interim, she has done well. She has been traveling, visiting her grandchildren in [place]. She has class 2 CHF symptoms and admits to both medical non-compliance and dietary indiscretion. She denies any ICD shock and has had no pain around her incision. No Known Allergies   Current Outpatient Prescriptions  Medication Sig Dispense Refill  . amiodarone (PACERONE) 200 MG tablet Take 200 mg by mouth daily.       . carvedilol (COREG) 25 MG tablet Take 1 tablet (25 mg total) by mouth 2 (two) times daily with a meal.  60 tablet  6  . furosemide (LASIX) 20 MG tablet Take 60 mg by mouth 2 (two) times daily.      Marland Kitchen losartan (COZAAR) 100 MG tablet Take 1 tablet (100 mg total) by mouth daily.  90 tablet  3  . spironolactone (ALDACTONE) 25 MG tablet Take 1 tablet (25 mg total) by mouth daily.  30 tablet  3  . warfarin (COUMADIN) 5 MG tablet Take 1 tablet (5 mg total) by mouth daily.  35 tablet  1   No current facility-administered medications for this visit.     Past Medical History  Diagnosis Date  . Hypertension   . CHF (congestive heart failure)   . Atrial fibrillation   . Encounter for long-term (current) use of anticoagulants   . Automatic implantable cardioverter-defibrillator in situ   . High cholesterol   . Anginal pain   . Exertional shortness of breath   . OSA on CPAP     "suppose to; not often" (07/22/2012)  . Other "heavy-for-dates" infants     ROS:   All systems reviewed and negative except as noted in the HPI.   Past Surgical History  Procedure Laterality Date  . Cardiac defibrillator placement  07/22/2012    dual-chamber ICD.  Marland Kitchen Abdominal hysterectomy  ~ 1998    "laparoscopic" (07/22/2012)  . Tubal ligation  ~ 1978  . Cardiac catheterization  ? 1990's     Family History    Problem Relation Age of Onset  . Heart disease Mother      History   Social History  . Marital Status: Single    Spouse Name: N/A    Number of Children: 3  . Years of Education: N/A   Occupational History  . Retired [employer]   Social History Main Topics  . Smoking status: Never Smoker   . Smokeless tobacco: Never Used  . Alcohol Use: No  . Drug Use: No  . Sexual Activity: No   Other Topics Concern  . Not on file   Social History Narrative  . No narrative on file     BP 141/71  Pulse 70  Ht 5' (1.524 m)  Wt 202 lb (91.627 kg)  BMI 39.45 kg/m2  Physical Exam:  Well appearing 67 yo woman, NAD HEENT: Unremarkable Neck:  No JVD, no thyromegally Back:  No CVA tenderness Lungs:  Clear with no wheezes, rales, or rhonchi. Well healed ICD incision. HEART:  Regular rate rhythm, no murmurs, no rubs, no clicks Abd:  soft, positive bowel sounds, no organomegally, no rebound, no guarding Ext:  2 plus pulses, no edema, no cyanosis, no clubbing Skin:  No rashes no nodules Neuro:  CN II through XII intact, motor grossly intact  DEVICE  Normal device function.  See PaceArt for details.   Assess/Plan:

## 2012-11-16 ENCOUNTER — Ambulatory Visit (INDEPENDENT_AMBULATORY_CARE_PROVIDER_SITE_OTHER): Payer: Medicare Other | Admitting: *Deleted

## 2012-11-16 DIAGNOSIS — I2789 Other specified pulmonary heart diseases: Secondary | ICD-10-CM

## 2012-11-16 DIAGNOSIS — I4891 Unspecified atrial fibrillation: Secondary | ICD-10-CM

## 2012-11-16 DIAGNOSIS — Z7901 Long term (current) use of anticoagulants: Secondary | ICD-10-CM

## 2012-11-23 ENCOUNTER — Ambulatory Visit (INDEPENDENT_AMBULATORY_CARE_PROVIDER_SITE_OTHER): Payer: Medicare Other | Admitting: Cardiovascular Disease

## 2012-11-23 ENCOUNTER — Encounter: Payer: Self-pay | Admitting: Cardiovascular Disease

## 2012-11-23 VITALS — BP 114/60 | HR 60 | Ht 60.0 in | Wt 206.4 lb

## 2012-11-23 DIAGNOSIS — I10 Essential (primary) hypertension: Secondary | ICD-10-CM

## 2012-11-23 DIAGNOSIS — Z79899 Other long term (current) drug therapy: Secondary | ICD-10-CM

## 2012-11-23 LAB — TSH: TSH: 3.65 u[IU]/mL (ref 0.35–5.50)

## 2012-11-23 LAB — T4, FREE: Free T4: 0.92 ng/dL (ref 0.60–1.60)

## 2012-11-23 NOTE — Progress Notes (Signed)
Patient ID: Erin [name], female   DOB: Mar 15, 1945, 67 y.o.   MRN: [id number] Erin [name] returns today for followup. She is a pleasant 67 yo woman with a h/o chronic systolic CHF, HTN, atrial fibrillation, s/p ICD implant approximately 4 months ago. In the interim, she has done well. She has been traveling, visiting her grandchildren in [place]. She has class 2 CHF symptoms and admits to both medical non-compliance and dietary indiscretion. She denies any ICD shock and has had no pain around her incision.  On amiodarone  LFTls normal 4/14   Last echo 05/06/12 reviewed  Study Conclusions  - Left ventricle: The cavity size was moderately dilated. Wall thickness was normal. The estimated ejection fraction was 25%. Diffuse hypokinesis. - Mitral valve: Mild regurgitation. - Left atrium: The atrium was moderately dilated. - Right atrium: The atrium was mildly dilated. - Atrial septum: No defect or patent foramen ovale was Identified.  No fluid overload. No edema.  Getting coumadin checked at our [facility] office  Needs primary care doctor   ROS: Denies fever, malais, weight loss, blurry vision, decreased visual acuity, cough, sputum, SOB, hemoptysis, pleuritic pain, palpitaitons, heartburn, abdominal pain, melena, lower extremity edema, claudication, or rash.  All other systems reviewed and negative  General: Affect appropriate Obese black female  HEENT: normal Neck supple with no adenopathy JVP normal no bruits no thyromegaly Lungs clear with no wheezing and good diaphragmatic motion Heart:  S1/S2 no murmur, no rub, gallop or click PMI normal Abdomen: benighn, BS positve, no tenderness, no AAA no bruit.  No HSM or HJR Distal pulses intact with no bruits No edema Neuro non-focal Skin warm and dry No muscular weakness   Current Outpatient Prescriptions  Medication Sig Dispense Refill  . amiodarone (PACERONE) 200 MG tablet Take 200 mg by mouth daily.       . carvedilol (COREG) 25  MG tablet Take 1 tablet (25 mg total) by mouth 2 (two) times daily with a meal.  60 tablet  6  . furosemide (LASIX) 20 MG tablet Take 60 mg by mouth 2 (two) times daily.      Marland Kitchen losartan (COZAAR) 100 MG tablet Take 1 tablet (100 mg total) by mouth daily.  90 tablet  3  . spironolactone (ALDACTONE) 25 MG tablet Take 1 tablet (25 mg total) by mouth daily.  30 tablet  3  . warfarin (COUMADIN) 5 MG tablet Take 1 tablet (5 mg total) by mouth daily.  35 tablet  1   No current facility-administered medications for this visit.    Allergies  Review of patient's allergies indicates no known allergies.  Electrocardiogram:  7/20 SR rate 66 nonspecific ST/T wave changes low voltage  Assessment and Plan

## 2012-11-23 NOTE — Assessment & Plan Note (Signed)
F/U Dr Ladona Ridgel Normal function with no discharges

## 2012-11-23 NOTE — Assessment & Plan Note (Signed)
Maint NSR On amiodarone Needs TSh/T4  And PFTls with [facility]

## 2012-11-23 NOTE — Assessment & Plan Note (Signed)
Stable with no angina and good activity level.  Continue medical Rx  

## 2012-11-23 NOTE — Patient Instructions (Signed)
Your physician wants you to follow-up in:   6 MONTHS WITH  DR Haywood Filler will receive a reminder letter in the mail two months in advance. If you don't receive a letter, please call our office to schedule the follow-up appointment. Your physician recommends that you continue on your current medications as directed. Please refer to the Current Medication list given to you today.  Your physician has recommended that you have a pulmonary function test. Pulmonary Function Tests are a group of tests that measure how well air moves in and out of your lungs.  WITH DLCO  Your physician recommends that you return for lab work in: TODAY  TSH   FREE T4 You have been referred to   NEEDS  PMD   IN [place] OFFICE

## 2012-11-23 NOTE — Assessment & Plan Note (Signed)
Well controlled.  Continue current medications and low sodium Dash type diet.    

## 2012-11-23 NOTE — Assessment & Plan Note (Signed)
Euvolemic No PND or othropnea Stable continue current meds

## 2012-11-28 ENCOUNTER — Telehealth: Payer: Self-pay | Admitting: Pulmonary Disease

## 2012-11-28 ENCOUNTER — Telehealth: Payer: Self-pay | Admitting: Cardiovascular Disease

## 2012-11-28 NOTE — Telephone Encounter (Signed)
PT AWARE OF LAB RESULTS./CY 

## 2012-11-28 NOTE — Telephone Encounter (Signed)
I am not trying to optimize pressure on an auto.  I need to know how well her sleep apnea is being controlled on her CURRENT settings.  If she does not have a Engineer, agricultural, how old is that device??? If she truly doesn't have downloadable device, will need a loaner regular bipap machine set on her current pressure, and for her to wear it for 3-4 weeks, then download to me.

## 2012-11-28 NOTE — Telephone Encounter (Signed)
Dr. Shelle Iron please advise if okay to do so? thanks

## 2012-11-28 NOTE — Telephone Encounter (Signed)
New message ° ° ° °Returned the nurses call. °

## 2012-11-29 NOTE — Telephone Encounter (Signed)
I spoke with Erin [name]. She reports she spoke with Mardelle Matte and advised they found a cord tat could possibly get a download. Will let us know if not.

## 2012-11-30 ENCOUNTER — Ambulatory Visit (INDEPENDENT_AMBULATORY_CARE_PROVIDER_SITE_OTHER): Payer: Medicare Other | Admitting: General Practice

## 2012-11-30 DIAGNOSIS — I2789 Other specified pulmonary heart diseases: Secondary | ICD-10-CM

## 2012-11-30 DIAGNOSIS — I4891 Unspecified atrial fibrillation: Secondary | ICD-10-CM

## 2012-11-30 DIAGNOSIS — Z7901 Long term (current) use of anticoagulants: Secondary | ICD-10-CM

## 2012-12-13 ENCOUNTER — Other Ambulatory Visit: Payer: Self-pay | Admitting: Internal Medicine

## 2012-12-13 ENCOUNTER — Other Ambulatory Visit: Payer: Self-pay | Admitting: Cardiovascular Disease

## 2012-12-14 ENCOUNTER — Ambulatory Visit (INDEPENDENT_AMBULATORY_CARE_PROVIDER_SITE_OTHER): Payer: Medicare Other | Admitting: General Practice

## 2012-12-14 DIAGNOSIS — I2789 Other specified pulmonary heart diseases: Secondary | ICD-10-CM

## 2012-12-14 DIAGNOSIS — Z7901 Long term (current) use of anticoagulants: Secondary | ICD-10-CM

## 2012-12-14 DIAGNOSIS — I4891 Unspecified atrial fibrillation: Secondary | ICD-10-CM

## 2013-01-18 ENCOUNTER — Ambulatory Visit (INDEPENDENT_AMBULATORY_CARE_PROVIDER_SITE_OTHER): Payer: Medicare Other

## 2013-01-18 DIAGNOSIS — Z7901 Long term (current) use of anticoagulants: Secondary | ICD-10-CM

## 2013-01-18 DIAGNOSIS — I2789 Other specified pulmonary heart diseases: Secondary | ICD-10-CM

## 2013-01-18 DIAGNOSIS — I4891 Unspecified atrial fibrillation: Secondary | ICD-10-CM

## 2013-01-18 LAB — POCT INR: INR: 3.4

## 2013-01-26 ENCOUNTER — Ambulatory Visit (INDEPENDENT_AMBULATORY_CARE_PROVIDER_SITE_OTHER): Payer: Medicare Other | Admitting: Internal Medicine

## 2013-01-26 DIAGNOSIS — I2789 Other specified pulmonary heart diseases: Secondary | ICD-10-CM

## 2013-01-26 DIAGNOSIS — Z79899 Other long term (current) drug therapy: Secondary | ICD-10-CM

## 2013-01-26 LAB — PULMONARY FUNCTION TEST
DL/VA % pred: 92 %
DL/VA: 3.61 ml/min/mmHg/L
DLCO unc % pred: 50 %
DLCO unc: 8.22 ml/min/mmHg
FEF 25-75 Post: 0.65 L/sec
FEF 25-75 Pre: 0.65 L/sec
FEF2575-%Change-Post: 0 %
FEF2575-%PRED-POST: 47 %
FEF2575-%Pred-Pre: 47 %
FEV1-%CHANGE-POST: -2 %
FEV1-%PRED-POST: 73 %
FEV1-%PRED-PRE: 75 %
FEV1-PRE: 1.04 L
FEV1-Post: 1.01 L
FEV1FVC-%CHANGE-POST: 0 %
FEV1FVC-%PRED-PRE: 94 %
FEV6-%Change-Post: -6 %
FEV6-%Pred-Post: 77 %
FEV6-%Pred-Pre: 82 %
FEV6-Post: 1.32 L
FEV6-Pre: 1.41 L
FEV6FVC-%Change-Post: -3 %
FEV6FVC-%PRED-PRE: 103 %
FEV6FVC-%Pred-Post: 100 %
FVC-%Change-Post: -2 %
FVC-%Pred-Post: 76 %
FVC-%Pred-Pre: 79 %
FVC-Post: 1.38 L
FVC-Pre: 1.42 L
POST FEV1/FVC RATIO: 73 %
PRE FEV6/FVC RATIO: 100 %
Post FEV6/FVC ratio: 96 %
Pre FEV1/FVC ratio: 73 %
RV % PRED: 69 %
RV: 1.27 L
TLC % pred: 65 %
TLC: 2.71 L

## 2013-01-26 NOTE — Progress Notes (Signed)
PFT done today. Orien Mayhall, CMA  

## 2013-02-01 ENCOUNTER — Ambulatory Visit (INDEPENDENT_AMBULATORY_CARE_PROVIDER_SITE_OTHER): Payer: Medicare Other | Admitting: *Deleted

## 2013-02-01 ENCOUNTER — Encounter: Payer: Self-pay | Admitting: Internal Medicine

## 2013-02-01 DIAGNOSIS — I420 Dilated cardiomyopathy: Secondary | ICD-10-CM

## 2013-02-01 DIAGNOSIS — Z7901 Long term (current) use of anticoagulants: Secondary | ICD-10-CM

## 2013-02-01 DIAGNOSIS — I5022 Chronic systolic (congestive) heart failure: Secondary | ICD-10-CM

## 2013-02-01 DIAGNOSIS — I4891 Unspecified atrial fibrillation: Secondary | ICD-10-CM

## 2013-02-01 DIAGNOSIS — I509 Heart failure, unspecified: Secondary | ICD-10-CM

## 2013-02-01 DIAGNOSIS — I428 Other cardiomyopathies: Secondary | ICD-10-CM

## 2013-02-01 DIAGNOSIS — I2789 Other specified pulmonary heart diseases: Secondary | ICD-10-CM

## 2013-02-01 LAB — MDC_IDC_ENUM_SESS_TYPE_INCLINIC
Brady Statistic AS VS Percent: 100 %
HighPow Impedance: 65 Ohm
Lead Channel Pacing Threshold Amplitude: 0.5 V
Lead Channel Pacing Threshold Pulse Width: 0.4 ms
Lead Channel Pacing Threshold Pulse Width: 0.4 ms
Lead Channel Setting Pacing Amplitude: 2 V
Lead Channel Setting Pacing Amplitude: 2.5 V
Lead Channel Setting Pacing Pulse Width: 0.4 ms
MDC IDC MSMT LEADCHNL RA IMPEDANCE VALUE: 494 Ohm
MDC IDC MSMT LEADCHNL RA PACING THRESHOLD AMPLITUDE: 0.75 V
MDC IDC MSMT LEADCHNL RA SENSING INTR AMPL: 5.4 mV
MDC IDC MSMT LEADCHNL RV IMPEDANCE VALUE: 399 Ohm
MDC IDC MSMT LEADCHNL RV SENSING INTR AMPL: 13.6 mV
MDC IDC SET LEADCHNL RV SENSING SENSITIVITY: 0.3 mV
Zone Setting Detection Interval: 300 ms

## 2013-02-01 LAB — POCT INR: INR: 1.7

## 2013-02-02 ENCOUNTER — Other Ambulatory Visit (HOSPITAL_COMMUNITY): Payer: Self-pay | Admitting: Internal Medicine

## 2013-02-07 NOTE — Progress Notes (Signed)
ICD check in clinic. Normal device function. Thresholds and sensing consistent with previous device measurements. Impedance trends stable over time. No evidence of any ventricular arrhythmias. No mode switches. Histogram distribution appropriate for patient and level of activity. No changes made this session. Device programmed at appropriate safety margins. Device programmed to optimize intrinsic conduction. Estimated longevity 10.7 years. Plan to follow up with the device clinic on 05-04-2013. Patient education completed including shock plan. Alert tones demonstrated for patient.

## 2013-02-13 ENCOUNTER — Telehealth: Payer: Self-pay | Admitting: *Deleted

## 2013-02-13 NOTE — Telephone Encounter (Signed)
Busick, Hospital doctor R More Detail >>      [employer]      Sent: Fri January 13, 2013  4:09 PM      To: Alois Cliche, LPN              Message      Henning Ehle,             None of my MD's are accepting new patients. The only person in the office that is at this time is our NP Raquel Rey, and her appointments are into late Feb.                                  AB             Referral   Referral # [id number]       Referral Information      Referral # Creation Date Referral Status Status Update    [id number] 11/23/2012 New Request 11/23/2012:Status History.              Status Reason Referral Type Referral Reasons Referral Class      Consultation Specialty Services Required Internal              To Specialty To Provider To Location/POS To Department    Family Medicine none none [facility]                 Vendor Referred By By Location/POS By Department    none none [facility]              Priority Start Date Expiration Date Referral Entered By    Routine 11/23/2012 05/22/2013 Kesleigh Morson E              Visits Requested Visits Authorized Visits Completed Visits Scheduled    1 1               Procedure Information      Procedure Modifiers Provider Requested Approved    [phone] - Ambulatory referral to Family Practice     1 1            Diagnosis Information      Diagnosis    401.9 (ICD-9-CM) - HTN (hypertension)           Referral Notes      Type Date User    Message 02/13/2013 10:18 AM Keeon Zurn E           Note    ----- Message -----       From: Westly Pam       Sent: 01/13/2013   4:09 PM         To: Alois Cliche, LPN              Stirling Orton,         None of my MD's are accepting new patients. The only person in the office that is at this time is our NP Raquel Rey, and her appointments are into late Feb.                        AB                                  Type  Date User    General 01/13/2013  4:10 PM BUSICK, AMBER R           Summary    Auto: Referral message           Note    ----- Message -----       From: Westly PamAmber R Busick       Sent: 01/13/2013   4:09 PM         To: Alois Clichehristine E Branden Vine, LPN         Alyna Stensland,         None of my MD's are accepting new patients. The only person in the office that is at this time is our NP Raquel Rey, and her appointments are into late Feb.                        AB                                  Type Date User    Provider Comments 11/23/2012 12:09 PM Scherrie BatemanYORK, Nadalee Neiswender E           Summary    Provider Comments           Note    [facility]           Referral Order    [facility]    [facility] # [id number][facility] on 11/23/2012      View Encounter           SPOKE WITH  PT HAS  FOUND A  PMD AT  ANOTHER  PRACTICE .Zack Seal/CY

## 2013-02-15 ENCOUNTER — Ambulatory Visit (INDEPENDENT_AMBULATORY_CARE_PROVIDER_SITE_OTHER): Payer: Medicare Other

## 2013-02-15 DIAGNOSIS — I4891 Unspecified atrial fibrillation: Secondary | ICD-10-CM

## 2013-02-15 DIAGNOSIS — I2789 Other specified pulmonary heart diseases: Secondary | ICD-10-CM

## 2013-02-15 DIAGNOSIS — Z7901 Long term (current) use of anticoagulants: Secondary | ICD-10-CM

## 2013-02-15 LAB — POCT INR: INR: 1.8

## 2013-03-09 ENCOUNTER — Encounter (HOSPITAL_COMMUNITY): Payer: Self-pay | Admitting: Cardiology

## 2013-03-14 ENCOUNTER — Other Ambulatory Visit (HOSPITAL_COMMUNITY): Payer: Self-pay | Admitting: Internal Medicine

## 2013-03-14 ENCOUNTER — Other Ambulatory Visit: Payer: Self-pay | Admitting: Cardiovascular Disease

## 2013-03-14 ENCOUNTER — Other Ambulatory Visit: Payer: Self-pay | Admitting: Internal Medicine

## 2013-03-15 ENCOUNTER — Ambulatory Visit (INDEPENDENT_AMBULATORY_CARE_PROVIDER_SITE_OTHER): Payer: Medicare Other

## 2013-03-15 DIAGNOSIS — Z7901 Long term (current) use of anticoagulants: Secondary | ICD-10-CM

## 2013-03-15 DIAGNOSIS — I4891 Unspecified atrial fibrillation: Secondary | ICD-10-CM

## 2013-03-15 DIAGNOSIS — I2789 Other specified pulmonary heart diseases: Secondary | ICD-10-CM

## 2013-03-15 LAB — POCT INR: INR: 3.4

## 2013-03-20 ENCOUNTER — Ambulatory Visit: Payer: Medicare Other | Admitting: Pulmonary Disease

## 2013-03-29 ENCOUNTER — Ambulatory Visit (INDEPENDENT_AMBULATORY_CARE_PROVIDER_SITE_OTHER): Payer: Medicare Other

## 2013-03-29 DIAGNOSIS — I2789 Other specified pulmonary heart diseases: Secondary | ICD-10-CM

## 2013-03-29 DIAGNOSIS — I4891 Unspecified atrial fibrillation: Secondary | ICD-10-CM

## 2013-03-29 DIAGNOSIS — Z7901 Long term (current) use of anticoagulants: Secondary | ICD-10-CM

## 2013-03-29 LAB — POCT INR: INR: 3

## 2013-04-04 ENCOUNTER — Ambulatory Visit: Payer: Medicare Other | Admitting: Pulmonary Disease

## 2013-04-19 ENCOUNTER — Ambulatory Visit (INDEPENDENT_AMBULATORY_CARE_PROVIDER_SITE_OTHER): Payer: Medicare Other

## 2013-04-19 DIAGNOSIS — I2789 Other specified pulmonary heart diseases: Secondary | ICD-10-CM

## 2013-04-19 DIAGNOSIS — Z7901 Long term (current) use of anticoagulants: Secondary | ICD-10-CM

## 2013-04-19 DIAGNOSIS — I4891 Unspecified atrial fibrillation: Secondary | ICD-10-CM

## 2013-04-19 LAB — POCT INR: INR: 3.6

## 2013-05-14 ENCOUNTER — Other Ambulatory Visit (HOSPITAL_COMMUNITY): Payer: Self-pay | Admitting: Cardiovascular Disease

## 2013-05-15 ENCOUNTER — Other Ambulatory Visit: Payer: Self-pay

## 2013-05-17 ENCOUNTER — Ambulatory Visit (INDEPENDENT_AMBULATORY_CARE_PROVIDER_SITE_OTHER): Payer: Medicare Other

## 2013-05-17 ENCOUNTER — Ambulatory Visit (INDEPENDENT_AMBULATORY_CARE_PROVIDER_SITE_OTHER): Payer: Medicare Other | Admitting: *Deleted

## 2013-05-17 DIAGNOSIS — I420 Dilated cardiomyopathy: Secondary | ICD-10-CM

## 2013-05-17 DIAGNOSIS — I4891 Unspecified atrial fibrillation: Secondary | ICD-10-CM

## 2013-05-17 DIAGNOSIS — I509 Heart failure, unspecified: Secondary | ICD-10-CM

## 2013-05-17 DIAGNOSIS — I5022 Chronic systolic (congestive) heart failure: Secondary | ICD-10-CM

## 2013-05-17 DIAGNOSIS — I428 Other cardiomyopathies: Secondary | ICD-10-CM

## 2013-05-17 DIAGNOSIS — I2789 Other specified pulmonary heart diseases: Secondary | ICD-10-CM

## 2013-05-17 DIAGNOSIS — Z7901 Long term (current) use of anticoagulants: Secondary | ICD-10-CM

## 2013-05-17 LAB — MDC_IDC_ENUM_SESS_TYPE_INCLINIC
Battery Voltage: 3.03 V
Brady Statistic AP VP Percent: 0 %
Brady Statistic AS VP Percent: 0.03 %
Brady Statistic RA Percent Paced: 0.01 %
HIGH POWER IMPEDANCE MEASURED VALUE: 68 Ohm
HighPow Impedance: 190 Ohm
Lead Channel Impedance Value: 399 Ohm
Lead Channel Pacing Threshold Amplitude: 0.5 V
Lead Channel Pacing Threshold Pulse Width: 0.4 ms
Lead Channel Sensing Intrinsic Amplitude: 13.875 mV
Lead Channel Sensing Intrinsic Amplitude: 5.25 mV
Lead Channel Setting Pacing Amplitude: 2 V
Lead Channel Setting Pacing Pulse Width: 0.4 ms
Lead Channel Setting Sensing Sensitivity: 0.3 mV
MDC IDC MSMT BATTERY REMAINING LONGEVITY: 127 mo
MDC IDC MSMT LEADCHNL RA IMPEDANCE VALUE: 494 Ohm
MDC IDC MSMT LEADCHNL RA PACING THRESHOLD PULSEWIDTH: 0.4 ms
MDC IDC MSMT LEADCHNL RA SENSING INTR AMPL: 4.75 mV
MDC IDC MSMT LEADCHNL RV PACING THRESHOLD AMPLITUDE: 0.5 V
MDC IDC SESS DTM: 20150513095941
MDC IDC SET LEADCHNL RV PACING AMPLITUDE: 2.5 V
MDC IDC SET ZONE DETECTION INTERVAL: 300 ms
MDC IDC SET ZONE DETECTION INTERVAL: 350 ms
MDC IDC STAT BRADY AP VS PERCENT: 0 %
MDC IDC STAT BRADY AS VS PERCENT: 99.96 %
MDC IDC STAT BRADY RV PERCENT PACED: 0.03 %
Zone Setting Detection Interval: 360 ms
Zone Setting Detection Interval: 360 ms

## 2013-05-17 LAB — POCT INR: INR: 1.7

## 2013-05-17 NOTE — Progress Notes (Signed)
ICD check in clinic. Normal device function. Thresholds and sensing consistent with previous device measurements. Impedance trends stable over time. No evidence of any ventricular arrhythmias. No mode switches. Histogram distribution appropriate for patient and level of activity.  Corvue normal.   No changes made this session. Device programmed at appropriate safety margins. Device programmed to optimize intrinsic conduction. Estimated longevity 10.5 years.  Patient education completed including shock plan. Alert tones/vibration demonstrated for patient.  ROV in August with Dr. Graciela Husbands in [place].

## 2013-05-23 ENCOUNTER — Encounter: Payer: Self-pay | Admitting: Internal Medicine

## 2013-05-24 ENCOUNTER — Ambulatory Visit: Payer: Medicare Other | Admitting: Cardiovascular Disease

## 2013-05-31 ENCOUNTER — Ambulatory Visit (INDEPENDENT_AMBULATORY_CARE_PROVIDER_SITE_OTHER): Payer: Medicare Other

## 2013-05-31 DIAGNOSIS — Z7901 Long term (current) use of anticoagulants: Secondary | ICD-10-CM

## 2013-05-31 DIAGNOSIS — I4891 Unspecified atrial fibrillation: Secondary | ICD-10-CM

## 2013-05-31 DIAGNOSIS — I2789 Other specified pulmonary heart diseases: Secondary | ICD-10-CM

## 2013-05-31 LAB — POCT INR: INR: 2.6

## 2013-06-21 ENCOUNTER — Ambulatory Visit (INDEPENDENT_AMBULATORY_CARE_PROVIDER_SITE_OTHER): Payer: Medicare Other

## 2013-06-21 DIAGNOSIS — Z7901 Long term (current) use of anticoagulants: Secondary | ICD-10-CM

## 2013-06-21 DIAGNOSIS — I2789 Other specified pulmonary heart diseases: Secondary | ICD-10-CM

## 2013-06-21 DIAGNOSIS — I4891 Unspecified atrial fibrillation: Secondary | ICD-10-CM

## 2013-06-21 LAB — POCT INR: INR: 2.9

## 2013-07-18 ENCOUNTER — Encounter: Payer: Self-pay | Admitting: Pulmonary Disease

## 2013-07-18 ENCOUNTER — Ambulatory Visit (INDEPENDENT_AMBULATORY_CARE_PROVIDER_SITE_OTHER): Payer: Medicare Other | Admitting: Pulmonary Disease

## 2013-07-18 VITALS — BP 120/80 | HR 78 | Temp 96.9°F | Ht 60.0 in | Wt 212.2 lb

## 2013-07-18 DIAGNOSIS — G4731 Primary central sleep apnea: Secondary | ICD-10-CM

## 2013-07-18 DIAGNOSIS — G4733 Obstructive sleep apnea (adult) (pediatric): Secondary | ICD-10-CM

## 2013-07-18 DIAGNOSIS — G4737 Central sleep apnea in conditions classified elsewhere: Secondary | ICD-10-CM

## 2013-07-18 NOTE — Assessment & Plan Note (Signed)
The patient has severe complex apnea, and we need to make sure that we are adequately controlling her sleep disordered breathing because of its impact to her cardiovascular health. She apparently has a very aged machine, and we'll therefore get her a new bilevel device that can be adjusted and downloaded. I've also stressed to the patient the importance of compliance, as well as aggressive weight loss. I have asked her to use nasal saline, as well as over-the-counter Zyrtec for her rhinitis symptoms. I also explained how to use her heated humidifier with her new device so that she can maintain nasal patency.

## 2013-07-18 NOTE — Progress Notes (Signed)
   Subjective:    Patient ID: Erin [name], female    DOB: 1945/02/03, 68 y.o.   MRN: [id number]  HPI The patient comes in today for followup of her severe complex apnea. At the last visit, I have asked the home care company for a download, but it turns out the patient's machine is so old that it is not a Designer, industrial/product.  However, we were not made aware of this until this week. The patient has had ongoing issues with wearing her bilevel because of nasal congestion with postnasal drip. It is unclear if she understands how to work the heated humidifier. The patient's weight has gone up 10 pounds since last visit.   Review of Systems  Constitutional: Negative for fever and unexpected weight change.  HENT: Positive for congestion and sinus pressure. Negative for dental problem, ear pain, nosebleeds, postnasal drip, rhinorrhea, sneezing, sore throat and trouble swallowing.   Eyes: Negative for redness and itching.  Respiratory: Positive for cough. Negative for chest tightness, shortness of breath and wheezing.   Cardiovascular: Negative for palpitations and leg swelling.  Gastrointestinal: Negative for nausea and vomiting.  Genitourinary: Negative for dysuria.  Musculoskeletal: Negative for joint swelling.  Skin: Negative for rash.  Neurological: Negative for headaches.  Hematological: Does not bruise/bleed easily.  Psychiatric/Behavioral: Negative for dysphoric mood. The patient is not nervous/anxious.        Objective:   Physical Exam Obese female in no acute distress Nose without purulence or discharge noted No skin breakdown or pressure necrosis from the CPAP mask Neck without lymphadenopathy or thyromegaly Lower extremities with mild edema, no cyanosis Alert, moves all 4 extremities.       Assessment & Plan:

## 2013-07-18 NOTE — Patient Instructions (Signed)
Will try to get you a new bipap machine, and use the auto setting to optimize your pressure.  Will get a download off your machine to make sure everything is going well.  Work on Media planner, remembering that your sleep apnea greatly affects your heart function Work on weight loss followup with me again in 71mos.

## 2013-07-19 ENCOUNTER — Ambulatory Visit (INDEPENDENT_AMBULATORY_CARE_PROVIDER_SITE_OTHER): Payer: Medicare Other

## 2013-07-19 DIAGNOSIS — I2789 Other specified pulmonary heart diseases: Secondary | ICD-10-CM

## 2013-07-19 DIAGNOSIS — I4891 Unspecified atrial fibrillation: Secondary | ICD-10-CM

## 2013-07-19 DIAGNOSIS — Z7901 Long term (current) use of anticoagulants: Secondary | ICD-10-CM

## 2013-07-19 LAB — POCT INR: INR: 1.5

## 2013-08-16 ENCOUNTER — Ambulatory Visit (INDEPENDENT_AMBULATORY_CARE_PROVIDER_SITE_OTHER): Payer: Medicare Other | Admitting: *Deleted

## 2013-08-16 DIAGNOSIS — I2789 Other specified pulmonary heart diseases: Secondary | ICD-10-CM

## 2013-08-16 DIAGNOSIS — Z7901 Long term (current) use of anticoagulants: Secondary | ICD-10-CM

## 2013-08-16 DIAGNOSIS — I4891 Unspecified atrial fibrillation: Secondary | ICD-10-CM

## 2013-08-16 LAB — POCT INR: INR: 2.5

## 2013-08-16 MED ORDER — WARFARIN SODIUM 5 MG PO TABS: 5.0000 mg | ORAL_TABLET | ORAL | Status: DC

## 2013-08-22 ENCOUNTER — Encounter: Payer: Self-pay | Admitting: Internal Medicine

## 2013-08-22 ENCOUNTER — Ambulatory Visit (INDEPENDENT_AMBULATORY_CARE_PROVIDER_SITE_OTHER): Payer: Medicare Other | Admitting: Internal Medicine

## 2013-08-22 VITALS — BP 130/82 | HR 68 | Ht 60.0 in | Wt 209.8 lb

## 2013-08-22 DIAGNOSIS — I10 Essential (primary) hypertension: Secondary | ICD-10-CM

## 2013-08-22 DIAGNOSIS — I4891 Unspecified atrial fibrillation: Secondary | ICD-10-CM

## 2013-08-22 DIAGNOSIS — I509 Heart failure, unspecified: Secondary | ICD-10-CM

## 2013-08-22 DIAGNOSIS — I420 Dilated cardiomyopathy: Secondary | ICD-10-CM

## 2013-08-22 DIAGNOSIS — I428 Other cardiomyopathies: Secondary | ICD-10-CM

## 2013-08-22 DIAGNOSIS — I5022 Chronic systolic (congestive) heart failure: Secondary | ICD-10-CM

## 2013-08-22 LAB — MDC_IDC_ENUM_SESS_TYPE_INCLINIC
Battery Remaining Longevity: 124 mo
Battery Voltage: 3.02 V
Date Time Interrogation Session: 20150818102158
HighPow Impedance: 190 Ohm
HighPow Impedance: 68 Ohm
Lead Channel Impedance Value: 494 Ohm
Lead Channel Pacing Threshold Amplitude: 0.5 V
Lead Channel Pacing Threshold Amplitude: 0.75 V
Lead Channel Sensing Intrinsic Amplitude: 13.25 mV
Lead Channel Sensing Intrinsic Amplitude: 4.75 mV
Lead Channel Setting Pacing Amplitude: 2.5 V
MDC IDC MSMT LEADCHNL RA PACING THRESHOLD PULSEWIDTH: 0.4 ms
MDC IDC MSMT LEADCHNL RA SENSING INTR AMPL: 4.75 mV
MDC IDC MSMT LEADCHNL RV IMPEDANCE VALUE: 399 Ohm
MDC IDC MSMT LEADCHNL RV PACING THRESHOLD PULSEWIDTH: 0.4 ms
MDC IDC MSMT LEADCHNL RV SENSING INTR AMPL: 14.5 mV
MDC IDC SET LEADCHNL RA PACING AMPLITUDE: 2 V
MDC IDC SET LEADCHNL RV PACING PULSEWIDTH: 0.4 ms
MDC IDC SET LEADCHNL RV SENSING SENSITIVITY: 0.3 mV
MDC IDC SET ZONE DETECTION INTERVAL: 300 ms
MDC IDC SET ZONE DETECTION INTERVAL: 360 ms
MDC IDC STAT BRADY AP VP PERCENT: 0 %
MDC IDC STAT BRADY AP VS PERCENT: 0 %
MDC IDC STAT BRADY AS VP PERCENT: 0.03 %
MDC IDC STAT BRADY AS VS PERCENT: 99.96 %
MDC IDC STAT BRADY RA PERCENT PACED: 0.01 %
MDC IDC STAT BRADY RV PERCENT PACED: 0.03 %
Zone Setting Detection Interval: 350 ms
Zone Setting Detection Interval: 360 ms

## 2013-08-22 MED ORDER — AMIODARONE HCL 200 MG PO TABS: ORAL_TABLET | ORAL | Status: DC

## 2013-08-22 NOTE — Progress Notes (Signed)
      Patient Care Team: No Pcp Per Patient as PCP - General (General Practice)   HPI  Erin [name] is a 68 y.o. female Seen in followup for ICD implantation the context of a nonischemic cardiomyopathy.echocardiogram 5/14 EF 25%  She has chronic systolic heart failure in the context of atrial fibrillation-paroxysmal. She takes amiodarone and warfarin  She also has sleep apnea  Past Medical History  Diagnosis Date  . Hypertension   . CHF (congestive heart failure)   . Atrial fibrillation   . Encounter for long-term (current) use of anticoagulants   . Automatic implantable cardioverter-defibrillator in situ   . High cholesterol   . Anginal pain   . Exertional shortness of breath   . OSA on CPAP     "suppose to; not often" (07/22/2012)  . Other "heavy-for-dates" infants     Past Surgical History  Procedure Laterality Date  . Cardiac defibrillator placement  07/22/2012    dual-chamber ICD.  Marland Kitchen Abdominal hysterectomy  ~ 1998    "laparoscopic" (07/22/2012)  . Tubal ligation  ~ 1978  . Cardiac catheterization  ? 1990's    Current Outpatient Prescriptions  Medication Sig Dispense Refill  . amiodarone (PACERONE) 200 MG tablet TAKE ONE TABLET BY MOUTH ONCE DAILY  90 tablet  0  . carvedilol (COREG) 25 MG tablet Take 1 tablet (25 mg total) by mouth 2 (two) times daily with a meal.  60 tablet  6  . furosemide (LASIX) 20 MG tablet TAKE THREE TABLETS BY MOUTH TWICE DAILY  180 tablet  1  . losartan (COZAAR) 100 MG tablet TAKE ONE TABLET BY MOUTH ONCE DAILY  90 tablet  0  . spironolactone (ALDACTONE) 25 MG tablet TAKE ONE TABLET BY MOUTH ONCE DAILY  90 tablet  0  . warfarin (COUMADIN) 5 MG tablet Take 1 tablet (5 mg total) by mouth as directed.  35 tablet  3   No current facility-administered medications for this visit.    No Known Allergies  Review of Systems negative except from HPI and PMH  Physical Exam BP 130/82  Pulse 68  Ht 5' (1.524 m)  Wt 209 lb 12 oz (95.142 kg)   BMI 40.96 kg/m2 Well developed and well nourished morbidly obese in no acute distress HENT normal E scleral and icterus clear Neck Supple JVP flat; carotids brisk and full Clear to ausculation  egular rate and rhythm, no murmurs gallops or rub Soft with active bowel sounds No clubbing cyanosis   Edema Alert and oriented, grossly normal motor and sensory function Skin Warm and Dry  ECG demonstrates sinus rhythm at 68 19/09/39  Assessment and  Plan  Paroxysmal atrial fibrillation  Nonischemic cardiomyopathy  Renal insufficiency grade 3 one year ago  Congestive heart failure-chronic-systolic  We discussed the role of NOACs as an alternative to warfarin. She will consider that costs him get back to [place].  She is euvolemic; we'll continue current medications  She's been no interval atrial fibrillation; we will decrease the dose from 72 weeks--5 his week to check surveillance laboratories.  She has a history of intermittent renal insufficiency. We will recheck her metabolic profile prior to the initiation of the NOACs

## 2013-08-22 NOTE — Patient Instructions (Signed)
Your physician has recommended you make the following change in your medication:  Decrease Amiodarone to 5 days a week as discussed with Dr. Graciela Husbands   Your physician recommends that you have labs today: CMP  TSH   Remote monitoring is used to monitor your Pacemaker of ICD from home. This monitoring reduces the number of office visits required to check your device to one time per year. It allows [place] to keep an eye on the functioning of your device to ensure it is working properly. You are scheduled for a device check from home on 11/23/13. You may send your transmission at any time that day. If you have a wireless device, the transmission will be sent automatically. After your physician reviews your transmission, you will receive a postcard with your next transmission date.  Your physician wants you to follow-up in: 1 year with Dr. Graciela Husbands. You will receive a reminder letter in the mail two months in advance. If you don't receive a letter, please call our office to schedule the follow-up appointment.  Your physician recommends that you schedule a follow-up appointment in:  6 weeks with our PA to discuss anticoagulation   Your physician recommends that you schedule a follow-up appointment in:  Follow up with Dr. Eden Emms in 6 months

## 2013-08-23 LAB — COMPREHENSIVE METABOLIC PANEL
ALK PHOS: 69 IU/L (ref 39–117)
ALT: 30 IU/L (ref 0–32)
AST: 27 IU/L (ref 0–40)
Albumin/Globulin Ratio: 1.4 (ref 1.1–2.5)
Albumin: 4.3 g/dL (ref 3.6–4.8)
BUN / CREAT RATIO: 22 (ref 11–26)
BUN: 34 mg/dL — ABNORMAL HIGH (ref 8–27)
CHLORIDE: 98 mmol/L (ref 97–108)
CO2: 25 mmol/L (ref 18–29)
Calcium: 9.3 mg/dL (ref 8.7–10.3)
Creatinine, Ser: 1.53 mg/dL — ABNORMAL HIGH (ref 0.57–1.00)
GFR calc non Af Amer: 35 mL/min/{1.73_m2} — ABNORMAL LOW (ref >59)
GFR, EST AFRICAN AMERICAN: 40 mL/min/{1.73_m2} — AB (ref >59)
Globulin, Total: 3.1 g/dL (ref 1.5–4.5)
Glucose: 82 mg/dL (ref 65–99)
POTASSIUM: 5 mmol/L (ref 3.5–5.2)
SODIUM: 141 mmol/L (ref 134–144)
Total Bilirubin: 0.6 mg/dL (ref 0.0–1.2)
Total Protein: 7.4 g/dL (ref 6.0–8.5)

## 2013-08-23 LAB — TSH: TSH: 4.92 u[IU]/mL — AB (ref 0.450–4.500)

## 2013-08-28 ENCOUNTER — Telehealth: Payer: Self-pay | Admitting: *Deleted

## 2013-08-28 DIAGNOSIS — I509 Heart failure, unspecified: Secondary | ICD-10-CM

## 2013-08-28 MED ORDER — FUROSEMIDE 20 MG PO TABS: ORAL_TABLET | ORAL | Status: DC

## 2013-08-28 NOTE — Progress Notes (Signed)
LVM 8/24 

## 2013-08-28 NOTE — Telephone Encounter (Signed)
Message copied by Fransico Setters on Mon Aug 28, 2013 10:56 AM ------      Message from: Duke Salvia      Created: Sat Aug 26, 2013 12:20 PM       Please havce her decrease lasis form 60 bid to dialy and PA visit in about 2 weeks with BEMT please ------

## 2013-08-31 ENCOUNTER — Other Ambulatory Visit (HOSPITAL_COMMUNITY): Payer: Self-pay | Admitting: Cardiovascular Disease

## 2013-09-13 ENCOUNTER — Ambulatory Visit: Payer: Medicare Other | Admitting: Nurse Practitioner

## 2013-09-18 ENCOUNTER — Ambulatory Visit: Payer: Medicare Other | Admitting: Nurse Practitioner

## 2013-09-20 ENCOUNTER — Ambulatory Visit (INDEPENDENT_AMBULATORY_CARE_PROVIDER_SITE_OTHER): Payer: Medicare Other

## 2013-09-20 ENCOUNTER — Telehealth: Payer: Self-pay | Admitting: Pulmonary Disease

## 2013-09-20 DIAGNOSIS — G4733 Obstructive sleep apnea (adult) (pediatric): Secondary | ICD-10-CM

## 2013-09-20 DIAGNOSIS — I2789 Other specified pulmonary heart diseases: Secondary | ICD-10-CM

## 2013-09-20 DIAGNOSIS — I4891 Unspecified atrial fibrillation: Secondary | ICD-10-CM

## 2013-09-20 DIAGNOSIS — Z7901 Long term (current) use of anticoagulants: Secondary | ICD-10-CM

## 2013-09-20 LAB — POCT INR: INR: 1.7

## 2013-09-20 NOTE — Telephone Encounter (Signed)
lmomtcb x1 

## 2013-09-20 NOTE — Telephone Encounter (Signed)
Please let pt know that I have seen her download and noted that she only wore the device 4 out of 30 days.  Find out what is going on, and how we can improve this.  Will need to make some changes to her pressure.  Please send order to DME  To set her bilevel on 14/10, and I need to see her again in 4 weeks.  Make sure she is on airview so we can download her device at visit.

## 2013-09-21 NOTE — Telephone Encounter (Signed)
lmomtcb x 2  

## 2013-09-22 NOTE — Telephone Encounter (Signed)
LMTCB X3

## 2013-09-25 ENCOUNTER — Encounter: Payer: Self-pay | Admitting: *Deleted

## 2013-09-25 NOTE — Telephone Encounter (Signed)
lmomtcb x4 for pt I have sent a letter as well

## 2013-09-28 ENCOUNTER — Ambulatory Visit: Payer: Medicare Other | Admitting: Nurse Practitioner

## 2013-09-28 NOTE — Telephone Encounter (Signed)
lmtcb x5 Letter was sent out to pt. Will forward to [facility] as an [place]

## 2013-11-22 ENCOUNTER — Other Ambulatory Visit (HOSPITAL_COMMUNITY): Payer: Self-pay | Admitting: Anesthesiology

## 2013-11-22 ENCOUNTER — Other Ambulatory Visit (HOSPITAL_COMMUNITY): Payer: Self-pay | Admitting: Cardiovascular Disease

## 2013-11-22 ENCOUNTER — Ambulatory Visit (INDEPENDENT_AMBULATORY_CARE_PROVIDER_SITE_OTHER): Payer: Medicare Other

## 2013-11-22 DIAGNOSIS — I4891 Unspecified atrial fibrillation: Secondary | ICD-10-CM

## 2013-11-22 DIAGNOSIS — Z7901 Long term (current) use of anticoagulants: Secondary | ICD-10-CM

## 2013-11-22 DIAGNOSIS — I279 Pulmonary heart disease, unspecified: Secondary | ICD-10-CM

## 2013-11-22 LAB — POCT INR: INR: 3.3

## 2013-11-23 ENCOUNTER — Encounter: Payer: Medicare Other | Admitting: *Deleted

## 2013-11-23 ENCOUNTER — Telehealth: Payer: Self-pay | Admitting: Cardiology

## 2013-11-23 NOTE — Telephone Encounter (Signed)
LMOVM reminding pt to send remote transmission.   

## 2013-11-24 ENCOUNTER — Encounter: Payer: Self-pay | Admitting: Cardiology

## 2013-12-14 ENCOUNTER — Encounter (HOSPITAL_COMMUNITY): Payer: Self-pay | Admitting: Internal Medicine

## 2014-01-17 ENCOUNTER — Telehealth: Payer: Self-pay

## 2014-01-17 NOTE — Telephone Encounter (Signed)
LMOM TCB for appt, pt is overdue for Coumadin follow-up.  Last INR checked on 11/22/13.  Called pt and LMOM TCB for appt on 12/20/13, 01/10/14 and again today 01/17/14.  Mailing delinquent Coumadin letter to pt's home address.

## 2014-01-18 ENCOUNTER — Ambulatory Visit: Payer: Medicare Other | Admitting: Pulmonary Disease

## 2014-02-12 IMAGING — CR DG KNEE COMPLETE 4+V*L*
4 series · 4 of 4 positions shown · non-contrast
Comparison: None.

CLINICAL DATA: Left knee pain.

LEFT KNEE - COMPLETE 4+ VIEW

[t knee ap left]
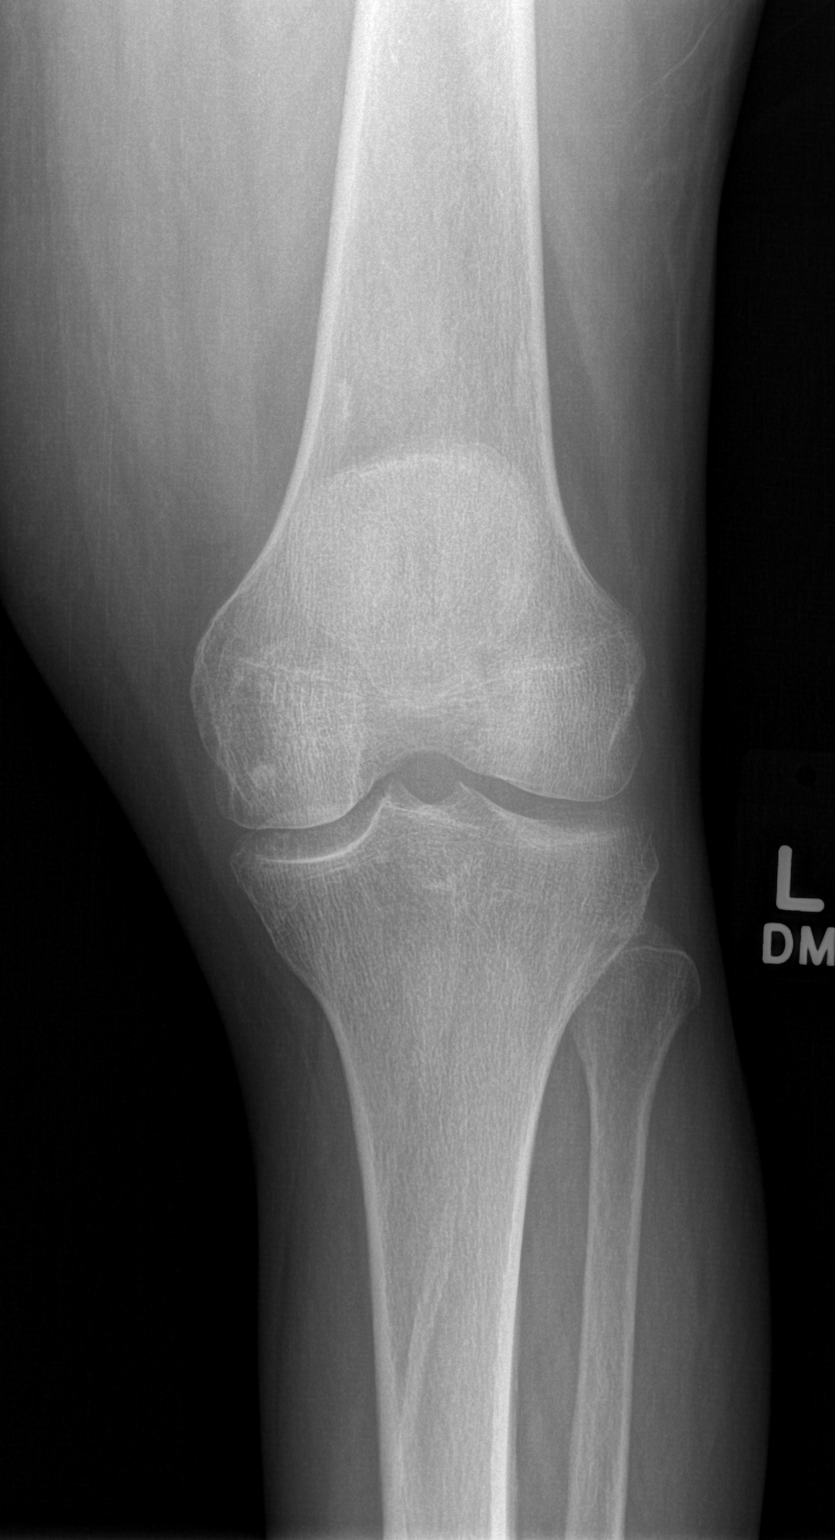

[t knee oblique left (1 of 2)]
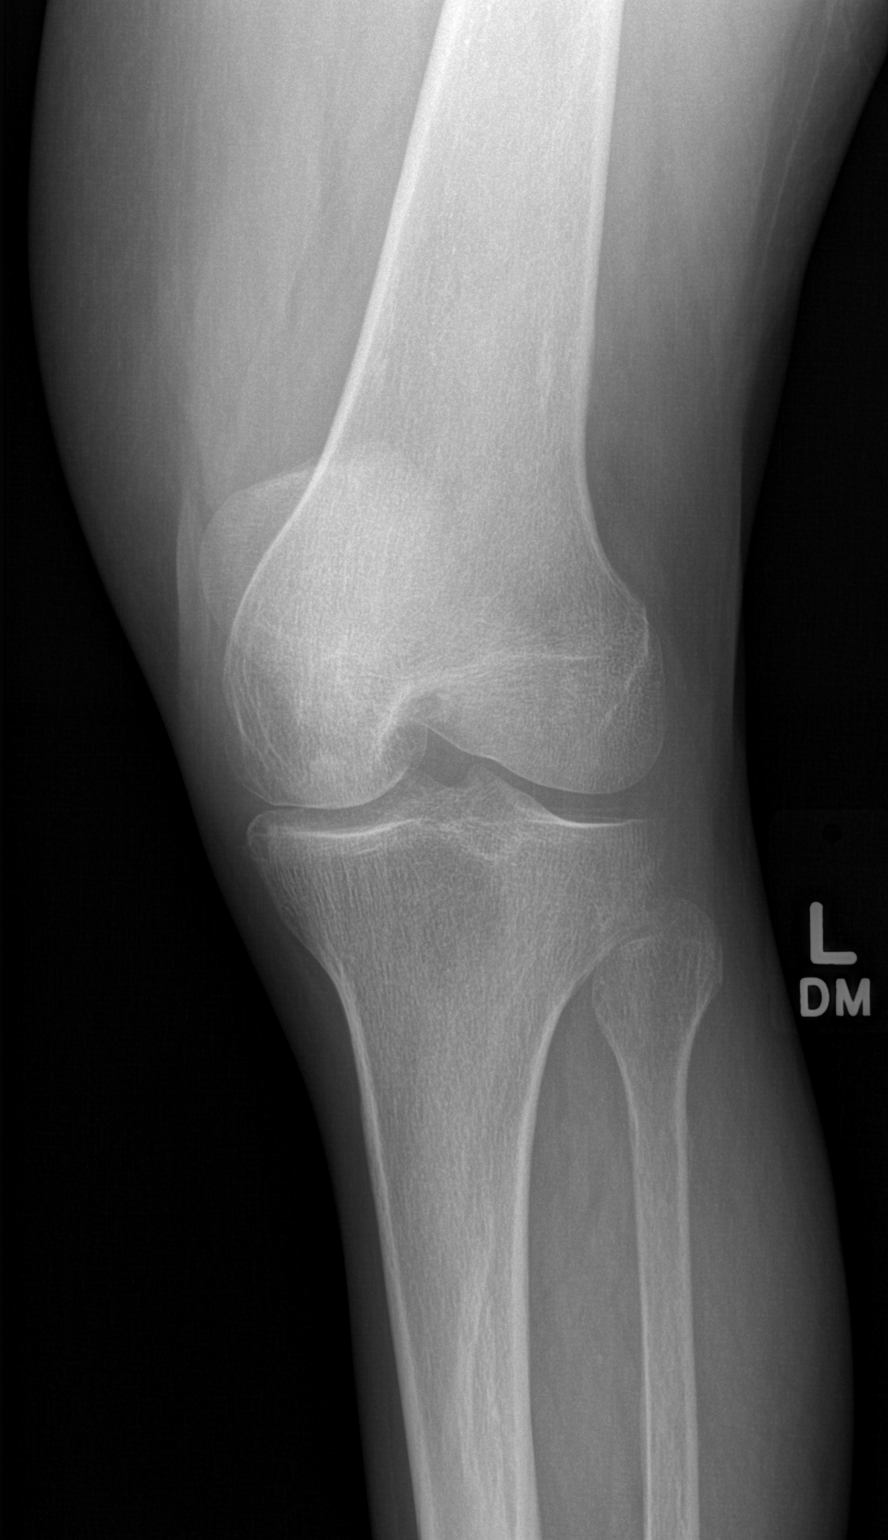

[t knee oblique left (2 of 2)]
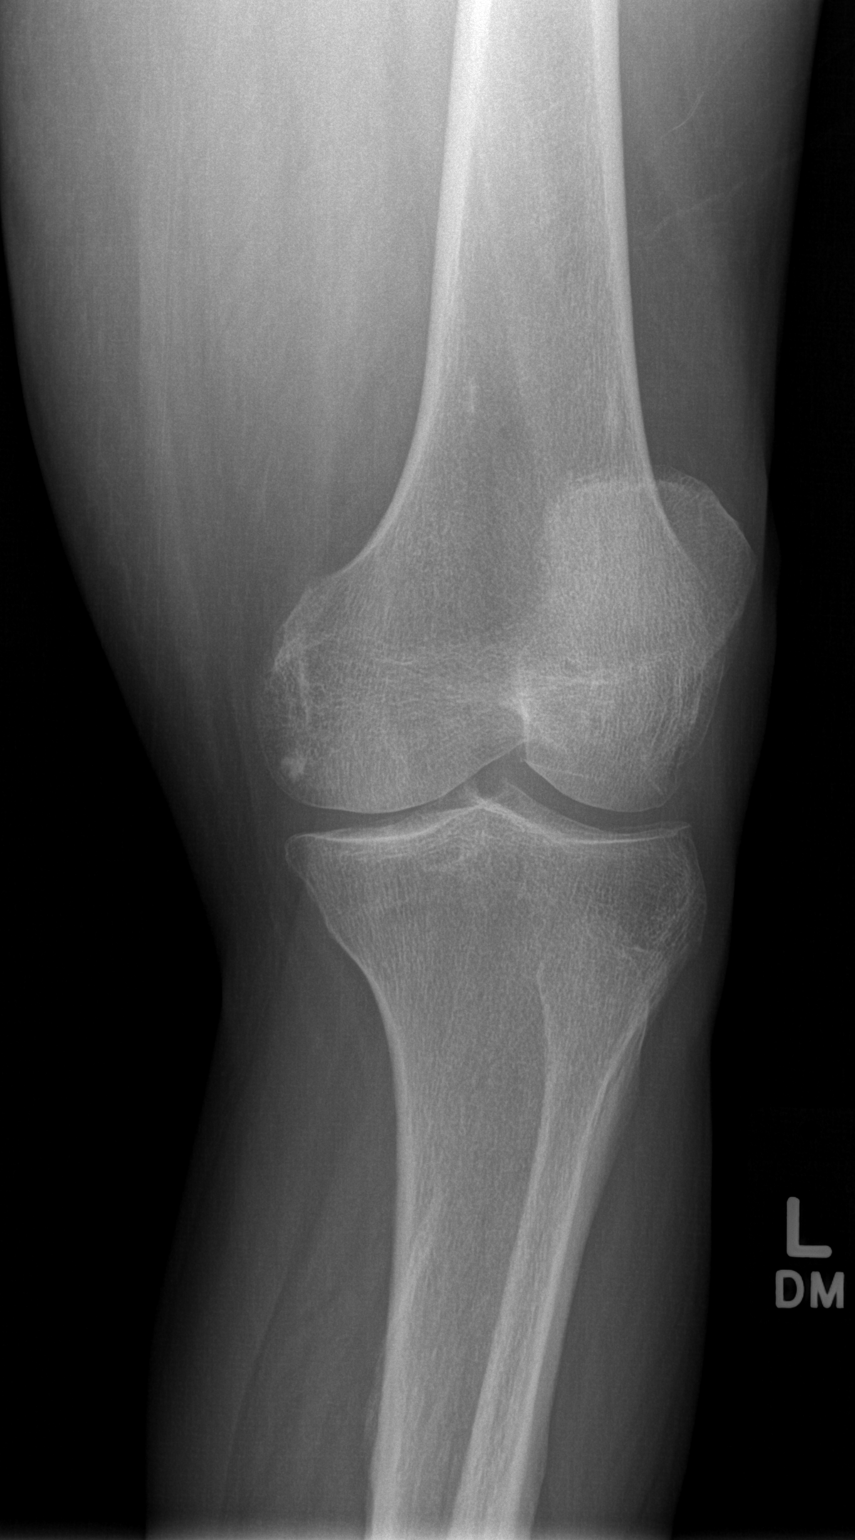

[t knee lat left]
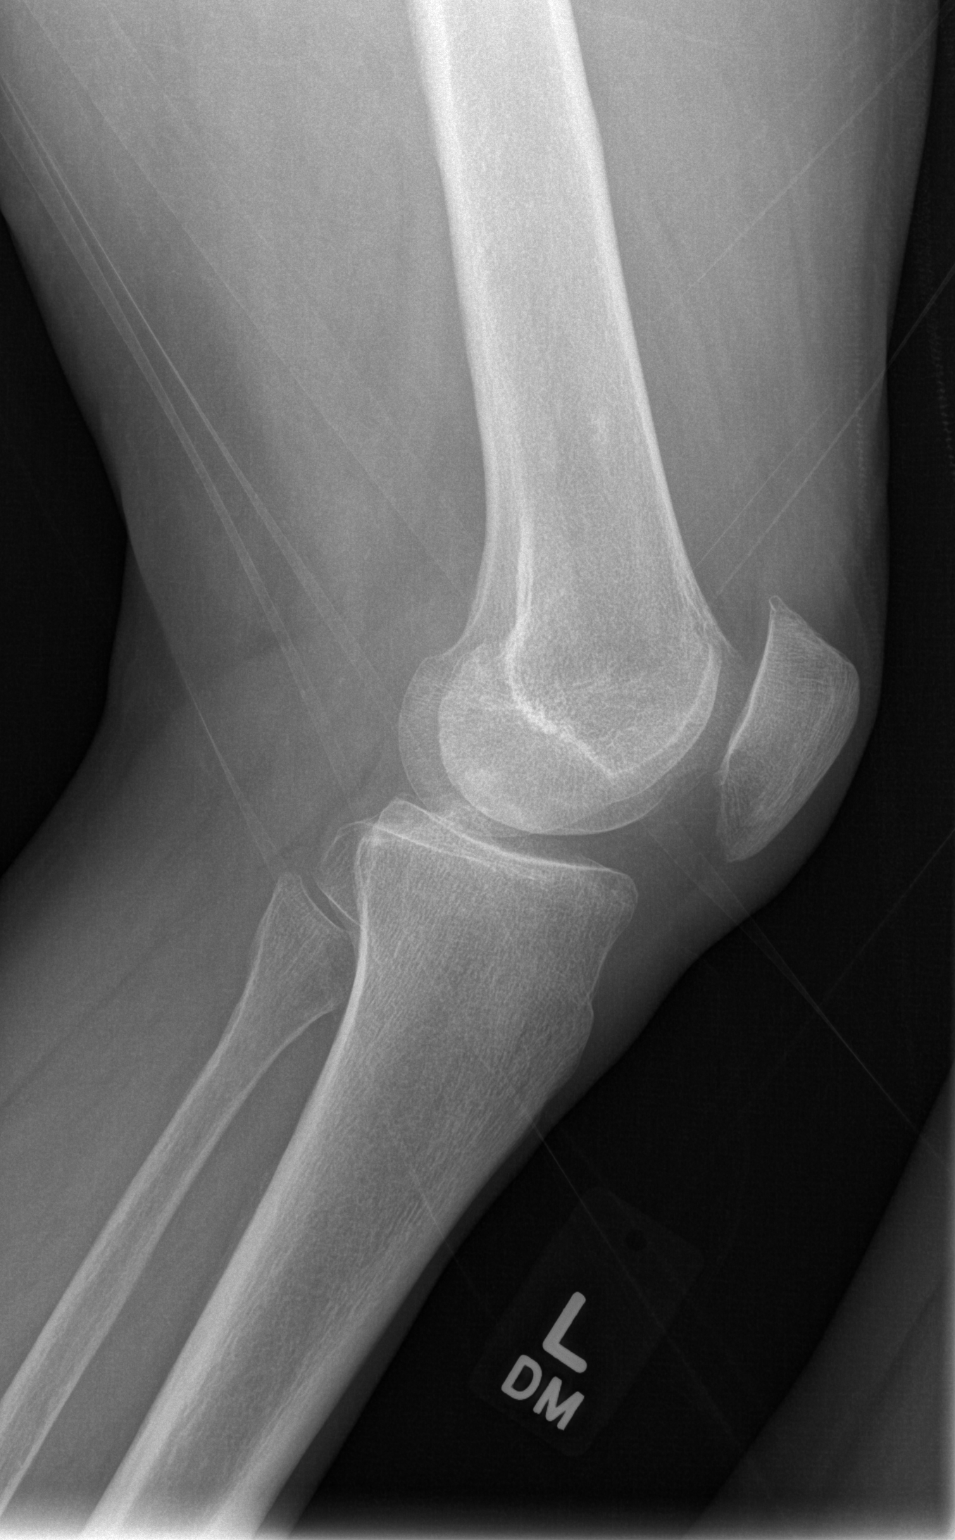

[4 of 4 positions shown; findings below may reference images not displayed]

FINDINGS: There is suggestion of faint chondrocalcinosis in the
lateral joint space.  Minimal proliferative changes are seen
involving the patella.  No significant joint space narrowing is
identified.  There is no evidence of bony lesion, fracture,
dislocation or joint effusion.
IMPRESSION: Mild degenerative changes as above.  No acute findings.

## 2014-08-13 ENCOUNTER — Emergency Department: Payer: Medicare Other

## 2014-08-13 ENCOUNTER — Inpatient Hospital Stay
Admission: EM | Admit: 2014-08-13 | Discharge: 2014-08-15 | DRG: 308 | Disposition: A | Discharge status: Other Acute Inpatient Hospital | Payer: Medicare Other | Attending: Internal Medicine | Admitting: Internal Medicine

## 2014-08-13 ENCOUNTER — Inpatient Hospital Stay (HOSPITAL_COMMUNITY)
Admit: 2014-08-13 | Discharge: 2014-08-13 | Disposition: A | Discharge status: Home / Self Care | Payer: Medicare Other | Attending: Specialist | Admitting: Specialist

## 2014-08-13 ENCOUNTER — Encounter: Payer: Self-pay | Admitting: Emergency Medicine

## 2014-08-13 DIAGNOSIS — Z95810 Presence of automatic (implantable) cardiac defibrillator: Secondary | ICD-10-CM

## 2014-08-13 DIAGNOSIS — Z90710 Acquired absence of both cervix and uterus: Secondary | ICD-10-CM

## 2014-08-13 DIAGNOSIS — E669 Obesity, unspecified: Secondary | ICD-10-CM | POA: Diagnosis present

## 2014-08-13 DIAGNOSIS — Z6841 Body Mass Index (BMI) 40.0 and over, adult: Secondary | ICD-10-CM

## 2014-08-13 DIAGNOSIS — N390 Urinary tract infection, site not specified: Secondary | ICD-10-CM

## 2014-08-13 DIAGNOSIS — I5023 Acute on chronic systolic (congestive) heart failure: Secondary | ICD-10-CM

## 2014-08-13 DIAGNOSIS — I4891 Unspecified atrial fibrillation: Secondary | ICD-10-CM | POA: Diagnosis present

## 2014-08-13 DIAGNOSIS — N170 Acute kidney failure with tubular necrosis: Secondary | ICD-10-CM | POA: Diagnosis present

## 2014-08-13 DIAGNOSIS — Z8249 Family history of ischemic heart disease and other diseases of the circulatory system: Secondary | ICD-10-CM | POA: Diagnosis not present

## 2014-08-13 DIAGNOSIS — R570 Cardiogenic shock: Secondary | ICD-10-CM | POA: Diagnosis present

## 2014-08-13 DIAGNOSIS — I493 Ventricular premature depolarization: Secondary | ICD-10-CM | POA: Diagnosis present

## 2014-08-13 DIAGNOSIS — G4733 Obstructive sleep apnea (adult) (pediatric): Secondary | ICD-10-CM | POA: Diagnosis present

## 2014-08-13 DIAGNOSIS — R112 Nausea with vomiting, unspecified: Secondary | ICD-10-CM

## 2014-08-13 DIAGNOSIS — I472 Ventricular tachycardia, unspecified: Secondary | ICD-10-CM

## 2014-08-13 DIAGNOSIS — I130 Hypertensive heart and chronic kidney disease with heart failure and stage 1 through stage 4 chronic kidney disease, or unspecified chronic kidney disease: Secondary | ICD-10-CM | POA: Diagnosis present

## 2014-08-13 DIAGNOSIS — I5043 Acute on chronic combined systolic (congestive) and diastolic (congestive) heart failure: Secondary | ICD-10-CM | POA: Diagnosis not present

## 2014-08-13 DIAGNOSIS — Z9114 Patient's other noncompliance with medication regimen: Secondary | ICD-10-CM | POA: Diagnosis present

## 2014-08-13 DIAGNOSIS — I2510 Atherosclerotic heart disease of native coronary artery without angina pectoris: Secondary | ICD-10-CM | POA: Diagnosis present

## 2014-08-13 DIAGNOSIS — E876 Hypokalemia: Secondary | ICD-10-CM | POA: Diagnosis present

## 2014-08-13 DIAGNOSIS — Z7901 Long term (current) use of anticoagulants: Secondary | ICD-10-CM | POA: Diagnosis not present

## 2014-08-13 DIAGNOSIS — I959 Hypotension, unspecified: Secondary | ICD-10-CM

## 2014-08-13 DIAGNOSIS — E780 Pure hypercholesterolemia: Secondary | ICD-10-CM | POA: Diagnosis present

## 2014-08-13 DIAGNOSIS — I420 Dilated cardiomyopathy: Secondary | ICD-10-CM | POA: Diagnosis present

## 2014-08-13 DIAGNOSIS — Z9119 Patient's noncompliance with other medical treatment and regimen: Secondary | ICD-10-CM | POA: Diagnosis present

## 2014-08-13 DIAGNOSIS — E785 Hyperlipidemia, unspecified: Secondary | ICD-10-CM | POA: Diagnosis present

## 2014-08-13 DIAGNOSIS — Z9851 Tubal ligation status: Secondary | ICD-10-CM

## 2014-08-13 DIAGNOSIS — R739 Hyperglycemia, unspecified: Secondary | ICD-10-CM | POA: Diagnosis present

## 2014-08-13 DIAGNOSIS — I340 Nonrheumatic mitral (valve) insufficiency: Secondary | ICD-10-CM | POA: Diagnosis not present

## 2014-08-13 DIAGNOSIS — N189 Chronic kidney disease, unspecified: Secondary | ICD-10-CM | POA: Diagnosis present

## 2014-08-13 DIAGNOSIS — D509 Iron deficiency anemia, unspecified: Secondary | ICD-10-CM | POA: Diagnosis not present

## 2014-08-13 DIAGNOSIS — D649 Anemia, unspecified: Secondary | ICD-10-CM

## 2014-08-13 DIAGNOSIS — I481 Persistent atrial fibrillation: Secondary | ICD-10-CM | POA: Diagnosis not present

## 2014-08-13 DIAGNOSIS — I480 Paroxysmal atrial fibrillation: Secondary | ICD-10-CM | POA: Diagnosis not present

## 2014-08-13 DIAGNOSIS — I429 Cardiomyopathy, unspecified: Secondary | ICD-10-CM | POA: Diagnosis not present

## 2014-08-13 DIAGNOSIS — Z79899 Other long term (current) drug therapy: Secondary | ICD-10-CM | POA: Diagnosis not present

## 2014-08-13 HISTORY — DX: Chronic systolic (congestive) heart failure: I50.22

## 2014-08-13 HISTORY — DX: Other cardiomyopathies: I42.8

## 2014-08-13 HISTORY — DX: Paroxysmal atrial fibrillation: I48.0

## 2014-08-13 HISTORY — DX: Patient's other noncompliance with medication regimen: Z91.14

## 2014-08-13 HISTORY — DX: Patient's other noncompliance with medication regimen for other reason: Z91.148

## 2014-08-13 LAB — BASIC METABOLIC PANEL
Anion gap: 13 (ref 5–15)
BUN: 12 mg/dL (ref 6–20)
CALCIUM: 8.7 mg/dL — AB (ref 8.9–10.3)
CO2: 20 mmol/L — AB (ref 22–32)
Chloride: 101 mmol/L (ref 101–111)
Creatinine, Ser: 0.85 mg/dL (ref 0.44–1.00)
GFR calc Af Amer: >60 mL/min (ref >60)
GFR calc non Af Amer: >60 mL/min (ref >60)
GLUCOSE: 118 mg/dL — AB (ref 65–99)
POTASSIUM: 3.4 mmol/L — AB (ref 3.5–5.1)
Sodium: 134 mmol/L — ABNORMAL LOW (ref 135–145)

## 2014-08-13 LAB — CBC
HEMATOCRIT: 32.8 % — AB (ref 35.0–47.0)
HEMOGLOBIN: 10.4 g/dL — AB (ref 12.0–16.0)
MCH: 22.1 pg — ABNORMAL LOW (ref 26.0–34.0)
MCHC: 31.7 g/dL — AB (ref 32.0–36.0)
MCV: 69.7 fL — ABNORMAL LOW (ref 80.0–100.0)
PLATELETS: 324 10*3/uL (ref 150–440)
RBC: 4.71 MIL/uL (ref 3.80–5.20)
RDW: 17.5 % — ABNORMAL HIGH (ref 11.5–14.5)
WBC: 7.7 10*3/uL (ref 3.6–11.0)

## 2014-08-13 LAB — MAGNESIUM: Magnesium: 1.8 mg/dL (ref 1.7–2.4)

## 2014-08-13 LAB — PROTIME-INR
INR: 1.24
PROTHROMBIN TIME: 15.8 s — AB (ref 11.4–15.0)

## 2014-08-13 LAB — TROPONIN I: Troponin I: <0.03 ng/mL (ref <0.031)

## 2014-08-13 MED ORDER — LOSARTAN POTASSIUM 50 MG PO TABS
100.0000 mg | ORAL_TABLET | Freq: Every day | ORAL | Status: DC
  Administered 2014-08-13: 100 mg via ORAL
  Filled 2014-08-13: qty 2

## 2014-08-13 MED ORDER — WARFARIN SODIUM 5 MG PO TABS
5.0000 mg | ORAL_TABLET | Freq: Once | ORAL | Status: DC
  Filled 2014-08-13: qty 1

## 2014-08-13 MED ORDER — ACETAMINOPHEN 325 MG PO TABS: 650.0000 mg | ORAL_TABLET | Freq: Four times a day (QID) | ORAL | Status: DC | PRN

## 2014-08-13 MED ORDER — SODIUM CHLORIDE 0.9 % IJ SOLN
3.0000 mL | Freq: Two times a day (BID) | INTRAMUSCULAR | Status: DC
  Administered 2014-08-13 – 2014-08-15 (×4): 3 mL via INTRAVENOUS

## 2014-08-13 MED ORDER — WARFARIN - PHYSICIAN DOSING INPATIENT
Freq: Every day | Status: DC
Start: 2014-08-13 — End: 2014-08-13
  Filled 2014-08-13 (×3): qty 1

## 2014-08-13 MED ORDER — ACETAMINOPHEN 650 MG RE SUPP: 650.0000 mg | Freq: Four times a day (QID) | RECTAL | Status: DC | PRN

## 2014-08-13 MED ORDER — APIXABAN 5 MG PO TABS
5.0000 mg | ORAL_TABLET | Freq: Two times a day (BID) | ORAL | Status: DC
  Administered 2014-08-13 – 2014-08-14 (×3): 5 mg via ORAL
  Filled 2014-08-13 (×3): qty 1

## 2014-08-13 MED ORDER — ONDANSETRON HCL 4 MG/2ML IJ SOLN
4.0000 mg | Freq: Four times a day (QID) | INTRAMUSCULAR | Status: DC | PRN
  Administered 2014-08-13 – 2014-08-14 (×2): 4 mg via INTRAVENOUS
  Filled 2014-08-13 (×2): qty 2

## 2014-08-13 MED ORDER — CARVEDILOL 25 MG PO TABS
25.0000 mg | ORAL_TABLET | Freq: Two times a day (BID) | ORAL | Status: DC
  Administered 2014-08-13: 25 mg via ORAL
  Filled 2014-08-13: qty 1

## 2014-08-13 MED ORDER — FUROSEMIDE 10 MG/ML IJ SOLN
40.0000 mg | Freq: Two times a day (BID) | INTRAMUSCULAR | Status: DC
  Administered 2014-08-13: 40 mg via INTRAVENOUS
  Filled 2014-08-13 (×2): qty 4

## 2014-08-13 MED ORDER — SODIUM CHLORIDE 0.9 % IV BOLUS (SEPSIS)
500.0000 mL | Freq: Once | INTRAVENOUS | Status: AC
  Administered 2014-08-13: 500 mL via INTRAVENOUS

## 2014-08-13 MED ORDER — AMIODARONE HCL 200 MG PO TABS
200.0000 mg | ORAL_TABLET | Freq: Every day | ORAL | Status: DC
  Filled 2014-08-13: qty 1

## 2014-08-13 MED ORDER — SODIUM CHLORIDE 0.9 % IV SOLN
Freq: Once | INTRAVENOUS | Status: AC
  Administered 2014-08-13: 15:00:00 via INTRAVENOUS

## 2014-08-13 MED ORDER — POTASSIUM CHLORIDE CRYS ER 20 MEQ PO TBCR
20.0000 meq | EXTENDED_RELEASE_TABLET | Freq: Two times a day (BID) | ORAL | Status: DC
  Administered 2014-08-13 – 2014-08-14 (×2): 20 meq via ORAL
  Filled 2014-08-13 (×2): qty 1

## 2014-08-13 MED ORDER — ONDANSETRON HCL 4 MG PO TABS: 4.0000 mg | ORAL_TABLET | Freq: Four times a day (QID) | ORAL | Status: DC | PRN

## 2014-08-13 MED ORDER — DILTIAZEM HCL 30 MG PO TABS: 60.0000 mg | ORAL_TABLET | Freq: Once | ORAL | Status: DC

## 2014-08-13 MED ORDER — DEXTROSE 5 % IV SOLN
5.0000 mg/h | INTRAVENOUS | Status: DC
  Administered 2014-08-13: 10 mg/h via INTRAVENOUS
  Filled 2014-08-13: qty 100

## 2014-08-13 MED ORDER — DILTIAZEM HCL 25 MG/5ML IV SOLN
20.0000 mg | Freq: Once | INTRAVENOUS | Status: AC
  Administered 2014-08-13: 20 mg via INTRAVENOUS
  Filled 2014-08-13: qty 5

## 2014-08-13 NOTE — H&P (Signed)
[facility] [place] [facility]   PATIENT NAME: Erin [name]    MR#:  [id number]  DATE OF BIRTH:  Apr 26, 1945  DATE OF ADMISSION:  08/13/2014  PRIMARY CARE PHYSICIAN:  Charlton Haws, MD  REQUESTING/REFERRING PHYSICIAN: Dr. Minna Antis  CHIEF COMPLAINT:   Chief Complaint  Patient presents with  . Leg Swelling  . Shortness of Breath     HISTORY OF PRESENT ILLNESS:  Erin [name]  is a 69 y.o. female with a known history of chronic afibrillation, CHF, hypertension, hyperlipidemia, obstructive sleep apnea, who presents to the hospital due to shortness of breath, lower extremity edema and noted to have atrial fibrillation with rapid ventricular response. Patient says that she has not taken any of her medications since this past Thursday. She noticed that since last week she has developed worsening swelling of her lower extremities and abdominal bloating and also has significant exertional shortness of breath. Her symptoms were not improving and therefore she came to the ER for further evaluation. Initially in the emergency room patient was noted to have heart rates in the 150s and she was given IV Cardizem bolus but her heart rate still remained in the 120s to 130s. She was also noted to be in congestive heart failure and hospitalist services were contacted for further treatment and evaluation.  PAST MEDICAL HISTORY:   Past Medical History  Diagnosis Date  . Hypertension   . CHF (congestive heart failure)   . Atrial fibrillation   . Encounter for long-term (current) use of anticoagulants   . Automatic implantable cardioverter-defibrillator in situ   . High cholesterol   . Anginal pain   . Exertional shortness of breath   . OSA on CPAP     "suppose to; not often" (07/22/2012)  . Other "heavy-for-dates" infants     PAST SURGICAL HISTORY:   Past Surgical History  Procedure Laterality Date  . Cardiac defibrillator placement  07/22/2012     dual-chamber ICD.  Marland Kitchen Abdominal hysterectomy  ~ 1998    "laparoscopic" (07/22/2012)  . Tubal ligation  ~ 1978  . Cardiac catheterization  ? 1990's  . Implantable cardioverter defibrillator implant N/A 07/22/2012    Procedure: IMPLANTABLE CARDIOVERTER DEFIBRILLATOR IMPLANT;  Surgeon: Marinus Maw, MD;  Location: [facility] CATH LAB;  Service: Cardiovascular;  Laterality: N/A;    SOCIAL HISTORY:   History  Substance Use Topics  . Smoking status: Never Smoker   . Smokeless tobacco: Never Used  . Alcohol Use: No    FAMILY HISTORY:   Family History  Problem Relation Age of Onset  . Heart disease Mother   . Heart disease Father     DRUG ALLERGIES:  No Known Allergies  REVIEW OF SYSTEMS:   Review of Systems  Constitutional: Negative for fever and weight loss.  HENT: Negative for congestion, nosebleeds and tinnitus.   Eyes: Negative for blurred vision, double vision and redness.  Respiratory: Positive for shortness of breath. Negative for cough and hemoptysis.   Cardiovascular: Positive for orthopnea, leg swelling and PND. Negative for chest pain.  Gastrointestinal: Negative for nausea, vomiting, abdominal pain, diarrhea and melena.  Genitourinary: Negative for dysuria, urgency and hematuria.  Musculoskeletal: Negative for joint pain and falls.  Neurological: Positive for weakness (generalized). Negative for dizziness, tingling, sensory change, focal weakness, seizures and headaches.  Endo/Heme/Allergies: Negative for polydipsia. Does not bruise/bleed easily.  Psychiatric/Behavioral: Negative for depression and memory loss. The patient is not nervous/anxious.     MEDICATIONS AT HOME:  Prior to Admission medications   Medication Sig Start Date End Date Taking? Authorizing Provider  amiodarone (PACERONE) 200 MG tablet Take 200 mg by mouth daily. Pt takes Monday-Friday.   Yes Historical Provider, MD  carvedilol (COREG) 25 MG tablet Take 25 mg by mouth 2 (two) times daily.   Yes  Historical Provider, MD  furosemide (LASIX) 20 MG tablet Take 60 mg by mouth daily.   Yes Historical Provider, MD  losartan (COZAAR) 100 MG tablet Take 100 mg by mouth daily.   Yes Historical Provider, MD  spironolactone (ALDACTONE) 25 MG tablet Take 25 mg by mouth daily.   Yes Historical Provider, MD  warfarin (COUMADIN) 5 MG tablet Take 2.5-5 mg by mouth daily. Pt takes one tablet on Saturday, Tuesday, and Thursday.   Pt takes one-half tablet on Sunday, Monday, Wednesday, and Friday.   Yes Historical Provider, MD      VITAL SIGNS:  Blood pressure 129/101, pulse 131, temperature 98.8 F (37.1 C), temperature source Oral, resp. rate 29, height 5' (1.524 m), weight 102.059 kg (225 lb), SpO2 91 %.  PHYSICAL EXAMINATION:  Physical Exam  GENERAL:  69 y.o.-year-old patient lying in the bed in mild respiratory distress.  EYES: Pupils equal, round, reactive to light and accommodation. No scleral icterus. Extraocular muscles intact.  HEENT: Head atraumatic, normocephalic. Oropharynx and nasopharynx clear. No oropharyngeal erythema, moist oral mucosa  NECK:  Supple, no jugular venous distention. No thyroid enlargement, no tenderness.  LUNGS: Normal breath sounds bilaterally, no wheezing, rales, rhonchi. No use of accessory muscles of respiration.  CARDIOVASCULAR: S1, S2 irregularly irregular. No murmurs, rubs, gallops, clicks.  ABDOMEN: Soft, nontender, nondistended. Bowel sounds present. No organomegaly or mass.  EXTREMITIES: +2 pedal edema b/l, No cyanosis, clubbing. + 2 pedal & radial pulses b/l.   NEUROLOGIC: Cranial nerves II through XII are intact. No focal Motor or sensory deficits appreciated b/l PSYCHIATRIC: The patient is alert and oriented x 3. Good affect.  SKIN: No obvious rash, lesion, or ulcer.   LABORATORY PANEL:   CBC  Recent Labs Lab 08/13/14 1423  WBC 7.7  HGB 10.4*  HCT 32.8*  PLT 324    ------------------------------------------------------------------------------------------------------------------  Chemistries   Recent Labs Lab 08/13/14 1423  NA 134*  K 3.4*  CL 101  CO2 20*  GLUCOSE 118*  BUN 12  CREATININE 0.85  CALCIUM 8.7*   ------------------------------------------------------------------------------------------------------------------  Cardiac Enzymes  Recent Labs Lab 08/13/14 1423  TROPONINI <0.03   ------------------------------------------------------------------------------------------------------------------  RADIOLOGY:  Dg Chest Portable 1 View  08/13/2014   CLINICAL DATA:  69 year old female with retention of fluid. Shortness of breath at times. Initial encounter.  EXAM: PORTABLE CHEST - 1 VIEW  COMPARISON:  None.  FINDINGS: Exam limited by portable technique and habitus.  Cardiomegaly with sequential pacemaker/ AICD in place, grossly appearing in similar position to prior exam.  Central pulmonary vascular prominence.  No obvious segmental consolidation or gross pneumothorax.  Patient would benefit from two view chest when able.  IMPRESSION: Exam limited by portable technique and habitus.  Cardiomegaly with sequential pacemaker/ AICD in place.  Central pulmonary vascular prominence.  No obvious segmental consolidation.   Electronically Signed   By: Lacy Duverney M.D.   On: 08/13/2014 15:08     IMPRESSION AND PLAN:   69 year old female with past medical history of chronic afibrillation, CHF, hypertension, hyperlipidemia, obstructive sleep apnea, who presented to the hospital with shortness of breath, leg swelling and also noted to be in atrial fibrillation with RVR and  congestive heart failure.  #1 atrial fibrillation with rapid ventricular response-this is likely secondary to patient's medical noncompliance. Patient has not taken her medications none about a week or so. -Discussed with cardiology and will not try to convert her at this point  but rather rate control and we'll start patient on IV Cardizem drip. -We'll also start Eliquis for long-term anticoagulation. -I will go ahead and consult cardiology. Check a two-dimensional echocardiogram.  #2 CHF-acute on chronic diastolic dysfunction. -This is likely related to patient's underlying atrial fibrillation with RVR. -We'll diurese with IV Lasix, follow I's and O's, daily weights. Cardiology consult, two-dimensional echo. -Continue Coreg, losartan.  #3 hypertension-continue Coreg, losartan.  #4 hypokalemia-we'll start on oral potassium supplements. Check magnesium level.  All the records are reviewed and case discussed with ED provider. Management plans discussed with the patient, family and they are in agreement.  CODE STATUS: Full  TOTAL CRITICAL CARE TIME TAKING CARE OF THIS PATIENT: 45 minutes.    Houston Siren M.D on 08/13/2014 at 4:56 PM  Between 7am to 6pm - Pager - [phone]  After 6pm go to [email or web address] - password [email or web address] [facility]  [facility]  Office  [phone]  CC: Primary care physician; Charlton Haws, MD

## 2014-08-13 NOTE — ED Notes (Signed)
Admitting MD at bedside.

## 2014-08-13 NOTE — ED Provider Notes (Addendum)
[facility] Emergency Department Provider Note  Time seen: 2:40 PM  I have reviewed the triage vital signs and the nursing notes.   HISTORY  Chief Complaint Leg Swelling and Shortness of Breath    HPI Erin [name] is a 69 y.o. female with a past medical history of hypertension, congestive heart failure, atrial fibrillation, Coumadin use, hyperlipidemia, AICD, exertional shortness of breath, presents to the emergency department for lower extremity swelling. According to the patient for the past 1-2 weeks she has noticed increased lower extremity swelling. She states she has had Lotrimin swelling similar in the past, but not in several years. Patient does currently take Lasix. In triage the patient was noted to have an elevated heart rate of approximately 130 to 150consistent with atrial fibrillation. Upon questioning the patient does state she has been feeling tired with shortness of breath during exertion the last several weeks. Patient was unaware that her heart was beating fast. Denies any chest pain.     Past Medical History  Diagnosis Date  . Hypertension   . CHF (congestive heart failure)   . Atrial fibrillation   . Encounter for long-term (current) use of anticoagulants   . Automatic implantable cardioverter-defibrillator in situ   . High cholesterol   . Anginal pain   . Exertional shortness of breath   . OSA on CPAP     "suppose to; not often" (07/22/2012)  . Other "heavy-for-dates" infants     Patient Active Problem List   Diagnosis Date Noted  . Chronic systolic CHF (congestive heart failure) 09/30/2012  . HTN (hypertension) 08/24/2012  . Congestive dilated cardiomyopathy 07/23/2012  . AICD (automatic cardioverter/defibrillator) present 07/23/2012  . Long term (current) use of anticoagulants 07/23/2010  . Complex sleep apnea syndrome 11/20/2007  . CORONARY ATHEROSCLEROSIS NATIVE CORONARY ARTERY 11/20/2007  . PULMONARY HYPERTENSION  11/20/2007  . ATRIAL FIBRILLATION 11/20/2007  . CONGESTIVE HEART FAILURE, CHRONIC 11/20/2007    Past Surgical History  Procedure Laterality Date  . Cardiac defibrillator placement  07/22/2012    dual-chamber ICD.  Marland Kitchen Abdominal hysterectomy  ~ 1998    "laparoscopic" (07/22/2012)  . Tubal ligation  ~ 1978  . Cardiac catheterization  ? 1990's  . Implantable cardioverter defibrillator implant N/A 07/22/2012    Procedure: IMPLANTABLE CARDIOVERTER DEFIBRILLATOR IMPLANT;  Surgeon: Marinus Maw, MD;  Location: [facility] CATH LAB;  Service: Cardiovascular;  Laterality: N/A;    Current Outpatient Rx  Name  Route  Sig  Dispense  Refill  . amiodarone (PACERONE) 200 MG tablet      TAKE ONE TABLET BY MOUTH ONCE DAILY FIVE DAYS A WEEK   90 tablet   3   . carvedilol (COREG) 25 MG tablet   Oral   Take 1 tablet (25 mg total) by mouth 2 (two) times daily with a meal. MAKE APPOINTMENT ASAP [phone]   60 tablet   0     PATIENT MUST MAKE APPOINTMENT WITH OUR OFFICE, NO  ...   . furosemide (LASIX) 20 MG tablet      TAKE THREE TABLETS BY MOUTH ONCE DAILY   180 tablet   1   . losartan (COZAAR) 100 MG tablet      TAKE ONE TABLET BY MOUTH ONCE DAILY   90 tablet   0     **PATIENT NEEDS AN APPOINTMENT WITH DR Eden Emms**   . spironolactone (ALDACTONE) 25 MG tablet      TAKE ONE TABLET BY MOUTH ONCE DAILY   90 tablet  0     **PATIENT NEEDS AN APPOINTMENT WITH DR Eden Emms**   . warfarin (COUMADIN) 5 MG tablet   Oral   Take 1 tablet (5 mg total) by mouth as directed.   35 tablet   3     Allergies Review of patient's allergies indicates no known allergies.  Family History  Problem Relation Age of Onset  . Heart disease Mother     Social History History  Substance Use Topics  . Smoking status: Never Smoker   . Smokeless tobacco: Never Used  . Alcohol Use: No    Review of Systems Constitutional: Negative for fever. Cardiovascular: Negative for chest pain. Respiratory: Positive for  shortness of breath with exertion. Gastrointestinal: Negative for abdominal pain, vomiting and diarrhea. Neurological: Negative for headaches, focal weakness or numbness.  10-point ROS otherwise negative.  ____________________________________________   PHYSICAL EXAM:  VITAL SIGNS: ED Triage Vitals  Enc Vitals Group     BP 08/13/14 1359 92/54 mmHg     Pulse Rate 08/13/14 1359 155     Resp 08/13/14 1359 22     Temp 08/13/14 1359 98.8 F (37.1 C)     Temp Source 08/13/14 1359 Oral     SpO2 08/13/14 1359 91 %     Weight 08/13/14 1359 225 lb (102.059 kg)     Height 08/13/14 1359 5' (1.524 m)     Head Cir --      Peak Flow --      Pain Score 08/13/14 1400 3     Pain Loc --      Pain Edu? --      Excl. in GC? --     Constitutional: Alert and oriented. Well appearing and in no distress. Eyes: Normal exam ENT   Mouth/Throat: Mucous membranes are moist. Cardiovascular: Irregular rhythm, rate around 140 bpm. No murmur. Respiratory: Normal respiratory effort without tachypnea nor retractions. Breath sounds are clear and equal bilaterally. No wheezes/rales/rhonchi. Gastrointestinal: Soft and nontender. No distention. Musculoskeletal: Nontender with normal range of motion in all extremities.  Neurologic:  Normal speech and language. No gross focal neurologic deficits Skin:  Skin is warm, dry and intact.  Psychiatric: Mood and affect are normal. Speech and behavior are normal.   ____________________________________________    EKG  EKG reviewed and interpreted by myself shows atrial fibrillation with rapid ventricular response around 133 bpm, narrow QRS, normal axis, prolonged QTC of 544 ms, nonspecific but no concerning ST changes noted.  ____________________________________________    RADIOLOGY  Pulmonary vascular congestion. No consolidation.  ____________________________________________    INITIAL IMPRESSION / ASSESSMENT AND PLAN / ED COURSE  Pertinent labs &  imaging results that were available during my care of the patient were reviewed by me and considered in my medical decision making (see chart for details).  Patient with atrial fibrillation with rapid ventricular response, currently on Coumadin. We will dose diltiazem to rate control the patient. Patient does take carvedilol at baseline. We will check labs including troponin, INR. We'll obtain a chest x-ray to help further evaluate. Patient does have 3+ lower extremity edema, equal bilaterally. Do not suspect DVT.  Patient with vascular congestion on chest x-ray. Patient is non-therapeutic with an INR of 1.2. Patient remains in nature fibrillation with a rate around 110 bpm. We have dosed the patient by mouth carvedilol, Coumadin, amiodarone. Patient had a approximate 10 beat run of ventricular tachycardia in the emergency department. We will admit the patient for further workup/treatment. The patient admits she has  not been taking her medications as prescribed, and has not been following up with cardiology as she should. I discussed the patient with Dr. Kirke Corin.  ____________________________________________   FINAL CLINICAL IMPRESSION(S) / ED DIAGNOSES  Atrial fibrillation with rapid ventricular response Peripheral edema Ventricular tachycardia  Minna Antis, MD 08/13/14 1549  Minna Antis, MD 08/13/14 1549

## 2014-08-13 NOTE — Progress Notes (Signed)
*  PRELIMINARY RESULTS* Echocardiogram 2D Echocardiogram has been performed.  Erin [name] 08/13/2014, 10:36 PM

## 2014-08-13 NOTE — Progress Notes (Signed)
ANTICOAGULATION CONSULT NOTE - Initial Consult  Pharmacy Consult for apixaban Indication: atrial fibrillation  No Known Allergies  Patient Measurements: Height: 5' (152.4 cm) Weight: 225 lb (102.059 kg) IBW/kg (Calculated) : 45.5    Labs:  Recent Labs  08/13/14 1423  HGB 10.4*  HCT 32.8*  PLT 324  LABPROT 15.8*  INR 1.24  CREATININE 0.85  TROPONINI <0.03    Estimated Creatinine Clearance: 67.2 mL/min (by C-G formula based on Cr of 0.85).   Medical History: Past Medical History  Diagnosis Date  . Hypertension   . CHF (congestive heart failure)   . Atrial fibrillation   . Encounter for long-term (current) use of anticoagulants   . Automatic implantable cardioverter-defibrillator in situ   . High cholesterol   . Anginal pain   . Exertional shortness of breath   . OSA on CPAP     "suppose to; not often" (07/22/2012)  . Other "heavy-for-dates" infants     Assessment: Pharmacy consulted to dose apixaban for afib in this 69 year old female. Patient previously on warfarin, however has not taken any of her medication since last Thursday per MD note. INR: 1.24.   Plan:  Will start apixaban 5mg  PO BID at this time.   Garlon Hatchet, PharmD Clinical Pharmacist  08/13/2014,5:09 PM

## 2014-08-13 NOTE — ED Notes (Signed)
Patient reports that for about the last week she has felt she was "gathering fluid" in her legs, feet and abdomen. Patient also reports she is short of breath at times.

## 2014-08-13 NOTE — ED Notes (Signed)
MD at bedside. 

## 2014-08-14 ENCOUNTER — Telehealth: Payer: Self-pay

## 2014-08-14 ENCOUNTER — Encounter: Payer: Self-pay | Admitting: Physician Assistant

## 2014-08-14 DIAGNOSIS — I959 Hypotension, unspecified: Secondary | ICD-10-CM

## 2014-08-14 DIAGNOSIS — I2510 Atherosclerotic heart disease of native coronary artery without angina pectoris: Secondary | ICD-10-CM | POA: Diagnosis present

## 2014-08-14 DIAGNOSIS — I480 Paroxysmal atrial fibrillation: Principal | ICD-10-CM

## 2014-08-14 DIAGNOSIS — I5023 Acute on chronic systolic (congestive) heart failure: Secondary | ICD-10-CM

## 2014-08-14 DIAGNOSIS — Z9119 Patient's noncompliance with other medical treatment and regimen: Secondary | ICD-10-CM

## 2014-08-14 DIAGNOSIS — Z9114 Patient's other noncompliance with medication regimen: Secondary | ICD-10-CM

## 2014-08-14 DIAGNOSIS — I4891 Unspecified atrial fibrillation: Secondary | ICD-10-CM

## 2014-08-14 LAB — URINALYSIS COMPLETE WITH MICROSCOPIC (ARMC ONLY)
Bilirubin Urine: NEGATIVE
Glucose, UA: NEGATIVE mg/dL
Hgb urine dipstick: NEGATIVE
Nitrite: NEGATIVE
Protein, ur: 100 mg/dL — AB
SPECIFIC GRAVITY, URINE: 1.015 (ref 1.005–1.030)
pH: 5 (ref 5.0–8.0)

## 2014-08-14 LAB — BASIC METABOLIC PANEL
ANION GAP: 6 (ref 5–15)
BUN: 16 mg/dL (ref 6–20)
CO2: 24 mmol/L (ref 22–32)
CREATININE: 1.5 mg/dL — AB (ref 0.44–1.00)
Calcium: 8.3 mg/dL — ABNORMAL LOW (ref 8.9–10.3)
Chloride: 110 mmol/L (ref 101–111)
GFR calc Af Amer: 40 mL/min — ABNORMAL LOW (ref >60)
GFR calc non Af Amer: 34 mL/min — ABNORMAL LOW (ref >60)
Glucose, Bld: 137 mg/dL — ABNORMAL HIGH (ref 65–99)
POTASSIUM: 4.2 mmol/L (ref 3.5–5.1)
Sodium: 140 mmol/L (ref 135–145)

## 2014-08-14 LAB — CBC
HCT: 30.5 % — ABNORMAL LOW (ref 35.0–47.0)
HEMOGLOBIN: 9.4 g/dL — AB (ref 12.0–16.0)
MCH: 21.8 pg — ABNORMAL LOW (ref 26.0–34.0)
MCHC: 30.9 g/dL — ABNORMAL LOW (ref 32.0–36.0)
MCV: 70.4 fL — ABNORMAL LOW (ref 80.0–100.0)
PLATELETS: 260 10*3/uL (ref 150–440)
RBC: 4.34 MIL/uL (ref 3.80–5.20)
RDW: 17.6 % — ABNORMAL HIGH (ref 11.5–14.5)
WBC: 6.8 10*3/uL (ref 3.6–11.0)

## 2014-08-14 LAB — HEMOGLOBIN A1C: Hgb A1c MFr Bld: 6.2 % — ABNORMAL HIGH (ref 4.0–6.0)

## 2014-08-14 LAB — TROPONIN I: Troponin I: <0.03 ng/mL

## 2014-08-14 MED ORDER — DOPAMINE-DEXTROSE 3.2-5 MG/ML-% IV SOLN
INTRAVENOUS | Status: AC
  Administered 2014-08-14: 2 ug/kg/min
  Filled 2014-08-14: qty 250

## 2014-08-14 MED ORDER — DOPAMINE-DEXTROSE 3.2-5 MG/ML-% IV SOLN
2.5000 ug/kg/min | INTRAVENOUS | Status: DC
  Administered 2014-08-14: 2 ug/kg/min via INTRAVENOUS

## 2014-08-14 MED ORDER — SODIUM CHLORIDE 0.9 % IV BOLUS (SEPSIS): 250.0000 mL | Freq: Once | INTRAVENOUS | Status: DC

## 2014-08-14 NOTE — Telephone Encounter (Signed)
lmom to schedule appt

## 2014-08-14 NOTE — Consult Note (Signed)
Cardiology Consultation Note  Patient ID: Erin [name], MRN: [id number], DOB/AGE: [birth year] 69 y.o. Admit date: 08/13/2014   Date of Consult: 08/14/2014 Primary Physician: Charlton Haws, MD Primary Cardiologist: Dr. Eden Emms, MD Primary Electrophysiologist: Dr. Graciela Husbands, MD  Chief Complaint: SOB Reason for Consult: Afib with RVR and acute on chronic systolic CHF  HPI: 69 y.o. female with h/o nonischemic cardiomyopathy by cardiac cath in 2003 s/p AICD in 07/2012, chronic systolic CHF, severe dilated cardiomyopathy, PAF on warfarin, medication noncompliance, HTN, HLD, obesity, and OSA on CPAP who presented to [facility] on 8/8 with increased SOB and lower extremity edema for the past 1 week and was found to be in Afib with RVR and acute on chronic systolic CHF. Cardiology is consulted for further evaluation.   She has known severely depressed EF as low as 10-20% in 2007 in the setting of NICM by cardiac cath in 2003 that showed left main normal, mid LAD with 50% stenosis, LCx normal, and RCA normal. EF 15%. Plan was to optimize her medical therapy at that time. Follow up echo in 05/2012 showed an EF of 25%, with diffuse hypokinesis, moderately dilated left atrium, mildly dilated right atrium, and PASP of 38 mm Hg. She underwent successful placement of AICD on 07/2012 for primary prevention. She has known PAF that has been well controlled with Coreg and amiodarone by ICD interrogations. She has been anticoagulated with warfarin. She has previously looked into NOACs, though it was felt this may be a cost issue for her. She has had issues with medication compliance in the past and will just stop taking all of her medications.   She presented to [facility] on 8/7 with a one week history of increased SOB and lower extremity swelling and was found to be in Afib with RVR and acute on chronic systolic CHF. She had not taken any of her medications in 5 to 6 days. Her INR upon arrival was found to be 1.28. Her last INR as an  outpatient was 3.3 back in November 2015. She was noticing increased fatigue, bloating, abdominal fullness, and having to take breaks with ambulation. No chest pain, palpitations, nausea, vomiting, diaphoresis, presyncope, or syncope. She is uncertain what her weight is at home. She has been sleeping on one pillow. She does apply salt to her food.   Upon her arrival to [facility] her heart rate was in the 130s, in Afib with RVR, and at times up into the 160s to 190s. EKG showed Afib with RVR, 133 bpm, low voltage, rare PVC, nonspecific inferolateral st/t changes. She received IV Cardizem bolus without improvement, thus she was placed on a Cardizem gtt. CXR showed pulmonary vascular prominence with possible CHF. BNP pending this morning. Echo is pending. HGB 9.4. Troponin negative x 1. BP has been soft overnight requiring NS bolus. She has voided -587 for the admission.        Past Medical History  Diagnosis Date  . Hypertension   . Chronic systolic CHF (congestive heart failure)     a. echo 2014: EF 25%, diffuse HK, LA moderately dilated, RA mildly dilated, PASP 38 mm Hg  . PAF (paroxysmal atrial fibrillation)     a. on warfarin  . Encounter for long-term (current) use of anticoagulants   . Automatic implantable cardioverter-defibrillator in situ   . High cholesterol   . Anginal pain   . Exertional shortness of breath   . OSA on CPAP     "suppose to; not often" (07/22/2012)  .  Other "heavy-for-dates" infants   . NICM (nonischemic cardiomyopathy)     a. s/p AICD 07/22/2012  . Coronary artery disease, non-occlusive     a. cath 2003: LM nl, mid LAD 50%, LCx nl, RCA nl  . H/O medication noncompliance       Most Recent Cardiac Studies: Cardiac cath 2003:  FINDINGS: LEFT VENTRICULOGRAM: LV: Pressure 131/20, ejection fraction approximately 15% with diffuse hypokinesis and probably thrombus lining the anterolateral wall.  HEMODYNAMICS: RA 14, RV 63/21, PA 62/35, pulmonary capillary wedge  pressure 15 with V waves to 19. Aorta 130/86. No aortic stenosis. Mitral regurgitation of 1+.  CORONARY ARTERIES: 1. Left main: No significant disease. 2. LAD: There is a 50% stenosis after the origin of a large first diagonal  branch. 3. Circumflex: The circumflex is a moderate sized vessel which gives rise  to a single obtuse marginal branch. The artery is normal. 4. RCA: The RCA is a large vessel. It is normal.  IMPRESSION: Severely impaired left ventricular systolic function. There is a 50% stenosis of the mid left anterior descending. It is conceivable that she had prior anterior infarction with subsequent adverse ventricular remodeling. At present, the stenosis is only moderate and with TIMI-3 flow. She would therefore not benefit from revascularization. Ventriculography demonstrates a probable thrombosis within the left ventricular cavity.   PLAN: Will continue medical therapy for her coronary disease and congestive heart failure with the introduction of digoxin and increase in her ACE inhibitor. In addition, we will begin heparin and transition to Coumadin. The heparin is to start 6 hours after her femoral sheaths are removed.   Echo 2014:  Study Conclusions  - Left ventricle: The cavity size was moderately dilated. Wall thickness was normal. The estimated ejection fraction was 25%. Diffuse hypokinesis. - Mitral valve: Mild regurgitation. - Left atrium: The atrium was moderately dilated. - Right atrium: The atrium was mildly dilated. - Atrial septum: No defect or patent foramen ovale was identified. - Pulmonary arteries: PA peak pressure: 38mm Hg (S).   Surgical History:  Past Surgical History  Procedure Laterality Date  . Cardiac defibrillator placement  07/22/2012    dual-chamber ICD.  Marland Kitchen Abdominal hysterectomy  ~ 1998    "laparoscopic" (07/22/2012)  . Tubal ligation  ~ 1978  . Cardiac catheterization  ? 1990's  .  Implantable cardioverter defibrillator implant N/A 07/22/2012    Procedure: IMPLANTABLE CARDIOVERTER DEFIBRILLATOR IMPLANT;  Surgeon: Marinus Maw, MD;  Location: [facility] CATH LAB;  Service: Cardiovascular;  Laterality: N/A;     Home Meds: Prior to Admission medications   Medication Sig Start Date End Date Taking? Authorizing Provider  amiodarone (PACERONE) 200 MG tablet Take 200 mg by mouth daily. Pt takes Monday-Friday.   Yes Historical Provider, MD  carvedilol (COREG) 25 MG tablet Take 25 mg by mouth 2 (two) times daily.   Yes Historical Provider, MD  furosemide (LASIX) 20 MG tablet Take 60 mg by mouth daily.   Yes Historical Provider, MD  losartan (COZAAR) 100 MG tablet Take 100 mg by mouth daily.   Yes Historical Provider, MD  spironolactone (ALDACTONE) 25 MG tablet Take 25 mg by mouth daily.   Yes Historical Provider, MD  warfarin (COUMADIN) 5 MG tablet Take 2.5-5 mg by mouth daily. Pt takes one tablet on Saturday, Tuesday, and Thursday.   Pt takes one-half tablet on Sunday, Monday, Wednesday, and Friday.   Yes Historical Provider, MD    Inpatient Medications:  . apixaban  5 mg Oral BID  .  carvedilol  25 mg Oral BID WC  . carvedilol  25 mg Oral BID  . furosemide  40 mg Intravenous Q12H  . losartan  100 mg Oral Daily  . potassium chloride  20 mEq Oral BID  . sodium chloride  250 mL Intravenous Once  . sodium chloride  3 mL Intravenous Q12H   . diltiazem (CARDIZEM) infusion Stopped (08/13/14 2300)    Allergies: No Known Allergies  History   Social History  . Marital Status: Single    Spouse Name: N/A  . Number of Children: 3  . Years of Education: N/A   Occupational History  . Retired [employer]   Social History Main Topics  . Smoking status: Never Smoker   . Smokeless tobacco: Never Used  . Alcohol Use: No  . Drug Use: No  . Sexual Activity: No   Other Topics Concern  . Not on file   Social History Narrative     Family History  Problem Relation Age of Onset   . Heart disease Mother   . Heart disease Father      Review of Systems: Review of Systems  Constitutional: Positive for malaise/fatigue. Negative for fever, chills, weight loss and diaphoresis.  HENT: Negative for congestion.   Eyes: Negative for blurred vision, discharge and redness.  Respiratory: Positive for cough and shortness of breath. Negative for hemoptysis, sputum production and wheezing.   Cardiovascular: Positive for palpitations and leg swelling. Negative for chest pain, orthopnea, claudication and PND.  Gastrointestinal: Negative for heartburn, nausea, abdominal pain, blood in stool and melena.  Genitourinary: Negative for hematuria.  Musculoskeletal: Negative for myalgias and falls.  Skin: Negative for rash.  Neurological: Positive for weakness. Negative for dizziness, sensory change, speech change, focal weakness and headaches.  Endo/Heme/Allergies: Does not bruise/bleed easily.  Psychiatric/Behavioral: Negative for depression and substance abuse. The patient is not nervous/anxious.   All other systems reviewed and are negative.   Labs:  Recent Labs  08/13/14 1423  TROPONINI <0.03   Lab Results  Component Value Date   WBC 6.8 08/14/2014   HGB 9.4* 08/14/2014   HCT 30.5* 08/14/2014   MCV 70.4* 08/14/2014   PLT 260 08/14/2014     Recent Labs Lab 08/14/14 0506  NA 140  K 4.2  CL 110  CO2 24  BUN 16  CREATININE 1.50*  CALCIUM 8.3*  GLUCOSE 137*   No results found for: CHOL, HDL, LDLCALC, TRIG No results found for: DDIMER  Radiology/Studies:  Dg Chest Portable 1 View  08/13/2014   CLINICAL DATA:  69 year old female with retention of fluid. Shortness of breath at times. Initial encounter.  EXAM: PORTABLE CHEST - 1 VIEW  COMPARISON:  None.  FINDINGS: Exam limited by portable technique and habitus.  Cardiomegaly with sequential pacemaker/ AICD in place, grossly appearing in similar position to prior exam.  Central pulmonary vascular prominence.  No  obvious segmental consolidation or gross pneumothorax.  Patient would benefit from two view chest when able.  IMPRESSION: Exam limited by portable technique and habitus.  Cardiomegaly with sequential pacemaker/ AICD in place.  Central pulmonary vascular prominence.  No obvious segmental consolidation.   Electronically Signed   By: Lacy Duverney M.D.   On: 08/13/2014 15:08    EKG: Afib with RVR, 133 bpm, low voltage, rare PVC, nonspecific inferolateral st/t changes   Weights: Filed Weights   08/13/14 1359 08/13/14 1800 08/14/14 0507  Weight: 225 lb (102.059 kg) 213 lb 3 oz (96.7 kg) 216 lb 0.8  oz (98 kg)     Physical Exam: Blood pressure 93/83, pulse 90, temperature 97.8 F (36.6 C), temperature source Oral, resp. rate 31, height 5' (1.524 m), weight 216 lb 0.8 oz (98 kg), SpO2 92 %. Body mass index is 42.19 kg/(m^2). General: Well developed, well nourished, in no acute distress. Head: Normocephalic, atraumatic, sclera non-icteric, no xanthomas, nares are without discharge.  Neck: Negative for carotid bruits. JVD not elevated. Lungs: Faint bilateral crackles, breathing is unlabored. Heart: Irregularly-irregular, with S1 S2. No murmurs, rubs, or gallops appreciated. Abdomen: Obese, soft, non-tender, non-distended with normoactive bowel sounds. No hepatomegaly. No rebound/guarding. No obvious abdominal masses. Msk:  Strength and tone appear normal for age. Extremities: No clubbing or cyanosis. 1+ pitting edema to the bilateral thighs.  Distal pedal pulses are 2+ and equal bilaterally. Paraesthesia right lateral thigh.  Neuro: Alert and oriented X 3. No facial asymmetry. No focal deficit. Moves all extremities spontaneously. Psych:  Responds to questions appropriately with a normal affect.    Assessment and Plan:  69 y.o. female with h/o nonischemic cardiomyopathy by cardiac cath in 2003 s/p AICD in 07/2012, chronic systolic CHF, PAF on warfarin, medication noncompliance, HTN, HLD, obesity,  and OSA on CPAP who presented to [facility] on 8/8 with increased SOB and lower extremity edema for the past 1 week and was found to be in Afib with RVR and acute on chronic systolic CHF.  1. Acute on chronic systolic CHF: -Last known EF 25% in 2014 -Echo this morning is pending -Has only voided -587 for the admission thus far  -SCr bump this morning from 0.85-->1.5, possibly secondary to ATN in the setting of hypotension  -Would need to hold diuresis at this time given bump in her renal function -Also hold losartan given her hypotension and bump in renal function  -As her BP improves to >110 systolic would aim to change from losartan to [facility] (will need case manager consult) -Check BNP  2. Possible cardiorenal syndrome: -Patient has negative output of 587 for admission thus far -Uncertain if low dose inotrope would benefit her at this time given her depressed EF and worsening renal function   3. Paroxysmal Afib with RVR: -Rate controlled currently -Coreg was held this morning in the setting of hypotension (SBP 80's) -Changed from warfarin to Eliquis in an effort to increase patient medication compliance  -Will need to consult case manager to see if she can afford Eliquis  -Worse case could consider amiodarone -CHADSVASc at least 4 (CHF, HTN, age, female) giving her an estimated annual stroke risk of 4.0%  4. Hypotension: -She continues to be hypotensive at this time with BP in the 70's over 40's -See above comments for consideration of possible low dose inotrope   5. Acute renal injury: -Likely ATN in the setting of hypotension  -Monitor   6. Medication noncompliance: -Patient's daughter will be around to aid with medications  7. OSA: -CPAP    Signed, Eula Listen, PA-C Pager: [phone] 08/14/2014, 8:41 AM

## 2014-08-14 NOTE — Progress Notes (Signed)
[facility] [place] at [facility]   PATIENT NAME: Erin [name]    MR#:  [id number]  DATE OF BIRTH:  Jan 05, 1946  SUBJECTIVE:  CHIEF COMPLAINT:   Chief Complaint  Patient presents with  . Leg Swelling  . 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 She was noted to have A. fib, RVR. In emergency room, given Cardizem IV and was started on Cardizem IV drip, unfortunately she was severely hypotensive requiring pressor support with dopamine. Remains hypotensive and is complaining of significant nausea and vomiting. Denies any chest pains, abdominal pains. Oxygen saturation in high 80s to low 90s, is on 2 L of oxygen through nasal cannula. Patient's urine output this about 600 cc since admission. Blood pressure medications as well as diuretics up being placed on hold. Patient had 1 troponin checked yesterday on admission and that was negative ROS  VITAL SIGNS: Blood pressure 86/66, pulse 86, temperature 97.8 F (36.6 C), temperature source Oral, resp. rate 22, height 5' (1.524 m), weight 98 kg (216 lb 0.8 oz), SpO2 95 %.  PHYSICAL EXAMINATION:   GENERAL:  69 y.o.-year-old patient lying in the bed with no acute distress.  EYES: Pupils equal, round, reactive to light and accommodation. No scleral icterus. Extraocular muscles intact.  HEENT: Head atraumatic, normocephalic. Oropharynx and nasopharynx clear.  NECK:  Supple, no jugular venous distention. No thyroid enlargement, no tenderness.  LUNGS: Normal breath sounds bilaterally, no wheezing, rales,rhonchi or crepitation. No use of accessory muscles of respiration. Diminished breath sounds at bases, few crackles at  bases CARDIOVASCULAR: S1, S2 normal, tachycardic, irregularly irregular. 2/6 systolic murmur, no rubs, or gallops.  ABDOMEN: Soft, nontender, nondistended. Bowel sounds present. No organomegaly or mass.  EXTREMITIES: No pedal edema, cyanosis, or clubbing.  NEUROLOGIC: Cranial nerves II through XII are intact. Muscle strength 5/5 in all extremities. Sensation intact. Gait not checked.  PSYCHIATRIC: The patient is alert and oriented x 3.  SKIN: No obvious rash, lesion, or ulcer. 1-2+ lower extremity swelling to bilateral thighs but no calve tenderness, or cyanosis was noted  ORDERS/RESULTS REVIEWED:   CBC  Recent Labs Lab 08/13/14 1423 08/14/14 0506  WBC 7.7 6.8  HGB 10.4* 9.4*  HCT 32.8* 30.5*  PLT 324 260  MCV 69.7* 70.4*  MCH 22.1* 21.8*  MCHC 31.7* 30.9*  RDW 17.5* 17.6*   ------------------------------------------------------------------------------------------------------------------  Chemistries   Recent Labs Lab 08/13/14 1423 08/14/14 0506  NA 134* 140  K 3.4* 4.2  CL 101 110  CO2 20* 24  GLUCOSE 118* 137*  BUN 12 16  CREATININE 0.85 1.50*  CALCIUM 8.7* 8.3*  MG 1.8  --    ------------------------------------------------------------------------------------------------------------------ estimated creatinine clearance is 37.2 mL/min (by C-G formula based on Cr of 1.5). ------------------------------------------------------------------------------------------------------------------ No results for input(s): TSH, T4TOTAL, T3FREE, THYROIDAB in the last 72 hours.  Invalid input(s): FREET3  Cardiac Enzymes  Recent Labs Lab 08/13/14 1423  TROPONINI <0.03   ------------------------------------------------------------------------------------------------------------------ Invalid input(s): POCBNP ---------------------------------------------------------------------------------------------------------------  RADIOLOGY: Dg Chest Portable 1 View  08/13/2014    CLINICAL DATA:  69 year old female with retention of fluid. Shortness of breath at times. Initial encounter.  EXAM: PORTABLE CHEST - 1 VIEW  COMPARISON:  None.  FINDINGS: Exam limited by portable technique and  habitus.  Cardiomegaly with sequential pacemaker/ AICD in place, grossly appearing in similar position to prior exam.  Central pulmonary vascular prominence.  No obvious segmental consolidation or gross pneumothorax.  Patient would benefit from two view chest when able.  IMPRESSION: Exam limited by portable technique and habitus.  Cardiomegaly with sequential pacemaker/ AICD in place.  Central pulmonary vascular prominence.  No obvious segmental consolidation.   Electronically Signed   By: Lacy Duverney M.D.   On: 08/13/2014 15:08    EKG:  Orders placed or performed during the hospital encounter of 08/13/14  . ED EKG within 10 minutes  . ED EKG within 10 minutes  . EKG 12-Lead  . EKG 12-Lead    ASSESSMENT AND PLAN:  Active Problems:   Atrial fibrillation with RVR   Coronary artery disease, non-occlusive   H/O medication noncompliance   PAF (paroxysmal atrial fibrillation)   Atrial fibrillation with rapid ventricular response   Acute on chronic systolic CHF (congestive heart failure) 1. A. fib, RVR, paroxysmal per cardiologist, unfortunately, unable to use Cardizem or any other medications to control patient's heart rate, may initiate patient on amiodarone, depending on her heart rate. Initiated on Eliquis 2. Acute on chronic systolic CHF with known cardiomyopathy, ejection fraction of 25%, echocardiogram today revealed ejection fraction of 25-30%, diffuse hypokinesis, mild MR, as well as mild-to-moderate TR, unfortunately, unable to use diuretics due to hypotension at present. Continue oxygen therapy. 3. Acute renal injury due to hypotension, likely ATN. Follow with therapy, get UA to rule out urinary tract infection 4. Hypotension, cardiogenic shock, continue dopamine infusion, keeping  map is around 65 and above.  5. Anemia. Follow with therapy 6. Hyperglycemia. Get hemoglobin A1c to rule out diabetes mellitus 7. Nausea and vomiting. Supportive therapy   Management plans discussed with the patient, family and they are in agreement.   DRUG ALLERGIES: No Known Allergies  CODE STATUS:     Code Status Orders        Start     Ordered   08/13/14 1824  Full code   Continuous     08/13/14 1823      TOTAL CRITICAL CARE TIME TAKING CARE OF THIS PATIENT: 40 minutes.    Katharina Caper M.D on 08/14/2014 at 12:21 PM  Between 7am to 6pm - Pager - [phone]  After 6pm go to [email or web address] - password [email or web address] [facility]  [facility]  Office  [phone]  CC: Primary care physician; Charlton Haws, MD

## 2014-08-14 NOTE — Discharge Instructions (Signed)
Heart Failure Clinic appointment on August 30, 2014 at 11:00am with Clarisa Kindred, FNP. Please call [phone] to reschedule.

## 2014-08-14 NOTE — Progress Notes (Signed)
Dr Winona Legato called regarding continued scant uop.  Able to keep MAP greater than 65 on Dopamine 2.68mcg/kg/min

## 2014-08-14 NOTE — Telephone Encounter (Signed)
-----   Message from Iran Ouch, MD sent at 08/13/2014  3:40 PM EDT ----- This patient needs follow-up this week with Dr. Graciela Husbands or any other provider.  She is in the ER with A-fib.

## 2014-08-14 NOTE — Progress Notes (Signed)
Pt does not feel urge to urinate.  Bladder scan tried and difficult to assess due to abdominal size.  Pt states she feels full of fluid and needs lasix.  Instructed that her bp cannot tolerate lasix at present.  I and Out cath'd for 75 ml cloudy amber urine.

## 2014-08-14 NOTE — Plan of Care (Signed)
Problem: Phase I Progression Outcomes Goal: Pain controlled with appropriate interventions Outcome: Progressing Denies pain Goal: OOB as tolerated unless otherwise ordered Outcome: Not Progressing On bedrest due to low bp Goal: Initial discharge plan identified Outcome: Progressing Plans to go home with daughter.  She has bedroom downstairs with own bath.  She has her home cpap machine that runs on room air Goal: Voiding-avoid urinary catheter unless indicated Outcome: Progressing In and out cath today to r/o urinary retention.  MD aware of poor urine output. Goal: Hemodynamically stable Outcome: Progressing Dopamine gtt was started and at 2.5 mcg to keep MAP >65

## 2014-08-15 ENCOUNTER — Inpatient Hospital Stay (HOSPITAL_COMMUNITY)
Admission: AD | Admit: 2014-08-15 | Discharge: 2014-08-22 | DRG: 287 | Disposition: A | Discharge status: Home / Self Care | Payer: Medicare Other | Source: Other Acute Inpatient Hospital | Attending: Internal Medicine | Admitting: Internal Medicine

## 2014-08-15 ENCOUNTER — Encounter: Payer: Self-pay | Admitting: Physician Assistant

## 2014-08-15 DIAGNOSIS — Z91148 Patient's other noncompliance with medication regimen for other reason: Secondary | ICD-10-CM

## 2014-08-15 DIAGNOSIS — I240 Acute coronary thrombosis not resulting in myocardial infarction: Secondary | ICD-10-CM | POA: Diagnosis present

## 2014-08-15 DIAGNOSIS — I480 Paroxysmal atrial fibrillation: Secondary | ICD-10-CM | POA: Diagnosis present

## 2014-08-15 DIAGNOSIS — Z6841 Body Mass Index (BMI) 40.0 and over, adult: Secondary | ICD-10-CM

## 2014-08-15 DIAGNOSIS — I2510 Atherosclerotic heart disease of native coronary artery without angina pectoris: Secondary | ICD-10-CM | POA: Diagnosis present

## 2014-08-15 DIAGNOSIS — N390 Urinary tract infection, site not specified: Secondary | ICD-10-CM

## 2014-08-15 DIAGNOSIS — I959 Hypotension, unspecified: Secondary | ICD-10-CM

## 2014-08-15 DIAGNOSIS — D509 Iron deficiency anemia, unspecified: Secondary | ICD-10-CM | POA: Diagnosis present

## 2014-08-15 DIAGNOSIS — I5043 Acute on chronic combined systolic (congestive) and diastolic (congestive) heart failure: Principal | ICD-10-CM | POA: Diagnosis present

## 2014-08-15 DIAGNOSIS — G4733 Obstructive sleep apnea (adult) (pediatric): Secondary | ICD-10-CM | POA: Diagnosis not present

## 2014-08-15 DIAGNOSIS — Z9114 Patient's other noncompliance with medication regimen: Secondary | ICD-10-CM | POA: Diagnosis not present

## 2014-08-15 DIAGNOSIS — I481 Persistent atrial fibrillation: Secondary | ICD-10-CM | POA: Diagnosis not present

## 2014-08-15 DIAGNOSIS — Z7901 Long term (current) use of anticoagulants: Secondary | ICD-10-CM

## 2014-08-15 DIAGNOSIS — B961 Klebsiella pneumoniae [K. pneumoniae] as the cause of diseases classified elsewhere: Secondary | ICD-10-CM | POA: Diagnosis not present

## 2014-08-15 DIAGNOSIS — I420 Dilated cardiomyopathy: Secondary | ICD-10-CM | POA: Diagnosis present

## 2014-08-15 DIAGNOSIS — I4891 Unspecified atrial fibrillation: Secondary | ICD-10-CM | POA: Diagnosis present

## 2014-08-15 DIAGNOSIS — Z95810 Presence of automatic (implantable) cardiac defibrillator: Secondary | ICD-10-CM | POA: Diagnosis present

## 2014-08-15 DIAGNOSIS — N179 Acute kidney failure, unspecified: Secondary | ICD-10-CM | POA: Diagnosis present

## 2014-08-15 DIAGNOSIS — Z9119 Patient's noncompliance with other medical treatment and regimen: Secondary | ICD-10-CM | POA: Diagnosis not present

## 2014-08-15 DIAGNOSIS — I10 Essential (primary) hypertension: Secondary | ICD-10-CM | POA: Diagnosis present

## 2014-08-15 DIAGNOSIS — R112 Nausea with vomiting, unspecified: Secondary | ICD-10-CM

## 2014-08-15 DIAGNOSIS — D649 Anemia, unspecified: Secondary | ICD-10-CM

## 2014-08-15 DIAGNOSIS — I5023 Acute on chronic systolic (congestive) heart failure: Secondary | ICD-10-CM | POA: Diagnosis not present

## 2014-08-15 DIAGNOSIS — I429 Cardiomyopathy, unspecified: Secondary | ICD-10-CM | POA: Diagnosis present

## 2014-08-15 DIAGNOSIS — E0590 Thyrotoxicosis, unspecified without thyrotoxic crisis or storm: Secondary | ICD-10-CM | POA: Diagnosis not present

## 2014-08-15 DIAGNOSIS — I213 ST elevation (STEMI) myocardial infarction of unspecified site: Secondary | ICD-10-CM | POA: Diagnosis not present

## 2014-08-15 DIAGNOSIS — I25118 Atherosclerotic heart disease of native coronary artery with other forms of angina pectoris: Secondary | ICD-10-CM | POA: Insufficient documentation

## 2014-08-15 HISTORY — DX: Obesity, unspecified: E66.9

## 2014-08-15 LAB — CBC WITH DIFFERENTIAL/PLATELET
Basophils Absolute: 0 10*3/uL (ref 0.0–0.1)
Basophils Relative: 0 % (ref 0–1)
EOS ABS: 0.3 10*3/uL (ref 0.0–0.7)
Eosinophils Relative: 4 % (ref 0–5)
HCT: 33 % — ABNORMAL LOW (ref 36.0–46.0)
HEMOGLOBIN: 10.4 g/dL — AB (ref 12.0–15.0)
LYMPHS PCT: 33 % (ref 12–46)
Lymphs Abs: 2.7 10*3/uL (ref 0.7–4.0)
MCH: 22.4 pg — AB (ref 26.0–34.0)
MCHC: 31.5 g/dL (ref 30.0–36.0)
MCV: 71 fL — AB (ref 78.0–100.0)
Monocytes Absolute: 1.1 10*3/uL — ABNORMAL HIGH (ref 0.1–1.0)
Monocytes Relative: 13 % — ABNORMAL HIGH (ref 3–12)
Neutro Abs: 4.1 10*3/uL (ref 1.7–7.7)
Neutrophils Relative %: 50 % (ref 43–77)
PLATELETS: 295 10*3/uL (ref 150–400)
RBC: 4.65 MIL/uL (ref 3.87–5.11)
RDW: 17.3 % — AB (ref 11.5–15.5)
WBC: 8.1 10*3/uL (ref 4.0–10.5)

## 2014-08-15 LAB — BASIC METABOLIC PANEL
Anion gap: 8 (ref 5–15)
BUN: 24 mg/dL — ABNORMAL HIGH (ref 6–20)
CALCIUM: 8.5 mg/dL — AB (ref 8.9–10.3)
CO2: 24 mmol/L (ref 22–32)
Chloride: 108 mmol/L (ref 101–111)
Creatinine, Ser: 1.73 mg/dL — ABNORMAL HIGH (ref 0.44–1.00)
GFR calc non Af Amer: 29 mL/min — ABNORMAL LOW (ref >60)
GFR, EST AFRICAN AMERICAN: 34 mL/min — AB (ref >60)
GLUCOSE: 115 mg/dL — AB (ref 65–99)
Potassium: 4 mmol/L (ref 3.5–5.1)
SODIUM: 140 mmol/L (ref 135–145)

## 2014-08-15 LAB — CBC
HCT: 31.6 % — ABNORMAL LOW (ref 35.0–47.0)
HEMOGLOBIN: 9.9 g/dL — AB (ref 12.0–16.0)
MCH: 22.1 pg — ABNORMAL LOW (ref 26.0–34.0)
MCHC: 31.4 g/dL — ABNORMAL LOW (ref 32.0–36.0)
MCV: 70.4 fL — ABNORMAL LOW (ref 80.0–100.0)
PLATELETS: 299 10*3/uL (ref 150–440)
RBC: 4.49 MIL/uL (ref 3.80–5.20)
RDW: 17.5 % — ABNORMAL HIGH (ref 11.5–14.5)
WBC: 8 10*3/uL (ref 3.6–11.0)

## 2014-08-15 LAB — APTT: aPTT: 158 seconds — ABNORMAL HIGH (ref 24–36)

## 2014-08-15 MED ORDER — CEPHALEXIN 250 MG PO CAPS
250.0000 mg | ORAL_CAPSULE | Freq: Three times a day (TID) | ORAL | Status: DC
  Administered 2014-08-15 – 2014-08-22 (×20): 250 mg via ORAL
  Filled 2014-08-15 (×23): qty 1

## 2014-08-15 MED ORDER — CEPHALEXIN 250 MG PO CAPS: 250.0000 mg | ORAL_CAPSULE | Freq: Three times a day (TID) | ORAL | Status: DC

## 2014-08-15 MED ORDER — SODIUM CHLORIDE 0.9 % IJ SOLN: 3.0000 mL | INTRAMUSCULAR | Status: DC | PRN

## 2014-08-15 MED ORDER — HEPARIN (PORCINE) IN NACL 100-0.45 UNIT/ML-% IJ SOLN
1200.0000 [IU]/h | INTRAMUSCULAR | Status: DC
  Administered 2014-08-16: 1200 [IU]/h via INTRAVENOUS
  Administered 2014-08-16: 1300 [IU]/h via INTRAVENOUS
  Filled 2014-08-15 (×3): qty 250

## 2014-08-15 MED ORDER — CEFTRIAXONE SODIUM 1 G IJ SOLR
1.0000 g | INTRAMUSCULAR | Status: DC
  Administered 2014-08-15: 1 g via INTRAVENOUS
  Filled 2014-08-15 (×2): qty 10

## 2014-08-15 MED ORDER — DOPAMINE-DEXTROSE 3.2-5 MG/ML-% IV SOLN: 2.0000 ug/kg/min | INTRAVENOUS | Status: DC

## 2014-08-15 MED ORDER — DEXTROSE 5 % IV SOLN
1.0000 g | INTRAVENOUS | Status: DC
  Filled 2014-08-15: qty 10

## 2014-08-15 MED ORDER — HEPARIN BOLUS VIA INFUSION
4500.0000 [IU] | Freq: Once | INTRAVENOUS | Status: AC
  Administered 2014-08-15: 4500 [IU] via INTRAVENOUS
  Filled 2014-08-15: qty 4500

## 2014-08-15 MED ORDER — DEXTROSE 5 % IV SOLN
4.0000 mg/h | INTRAVENOUS | Status: DC
  Filled 2014-08-15: qty 25

## 2014-08-15 MED ORDER — SODIUM CHLORIDE 0.9 % IJ SOLN
3.0000 mL | Freq: Two times a day (BID) | INTRAMUSCULAR | Status: DC
  Administered 2014-08-18 – 2014-08-22 (×8): 3 mL via INTRAVENOUS

## 2014-08-15 MED ORDER — ONDANSETRON HCL 4 MG PO TABS: 4.0000 mg | ORAL_TABLET | Freq: Four times a day (QID) | ORAL | Status: DC | PRN

## 2014-08-15 MED ORDER — ACETAMINOPHEN 325 MG PO TABS: 650.0000 mg | ORAL_TABLET | ORAL | Status: DC | PRN

## 2014-08-15 MED ORDER — DOPAMINE-DEXTROSE 3.2-5 MG/ML-% IV SOLN: 2.5000 ug/kg/min | INTRAVENOUS | Status: DC

## 2014-08-15 MED ORDER — HEPARIN (PORCINE) IN NACL 100-0.45 UNIT/ML-% IJ SOLN: 1400.0000 [IU]/h | INTRAMUSCULAR | Status: DC

## 2014-08-15 MED ORDER — SODIUM CHLORIDE 0.9 % IV SOLN: 250.0000 mL | INTRAVENOUS | Status: DC | PRN

## 2014-08-15 MED ORDER — FUROSEMIDE 10 MG/ML IJ SOLN
4.0000 mg/h | INTRAVENOUS | Status: DC
  Administered 2014-08-15: 4 mg/h via INTRAVENOUS
  Filled 2014-08-15: qty 25

## 2014-08-15 MED ORDER — HEPARIN (PORCINE) IN NACL 100-0.45 UNIT/ML-% IJ SOLN
1300.0000 [IU]/h | INTRAMUSCULAR | Status: DC
  Administered 2014-08-15: 1400 [IU]/h via INTRAVENOUS
  Filled 2014-08-15 (×2): qty 250

## 2014-08-15 MED ORDER — ONDANSETRON HCL 4 MG/2ML IJ SOLN: 4.0000 mg | Freq: Four times a day (QID) | INTRAMUSCULAR | Status: DC | PRN

## 2014-08-15 NOTE — Progress Notes (Signed)
Patient transferred tonight from [facility] with acute on chronic systolic CHF.  She has a history of NIDCM with EF 25%, chronic systolic CHF, PAF and nonobstructive CAD and presented there with tachycardia, SOB and LE edema.  She was hypotensive and was in afib with RVR.  She was given a bolus of IV Cardizem but BP dropped and she was started on dopamine.  She developed acute kidney injury due to hypotension and also there was concern for UTI and she was started on PO antibiotics.  There was concern that she may have urosepsis in addition to cardiogenic shock.  Her BP was stabilized on dopamine gtt.  Urine cultures are pending. She is now transferred to [facility] for possible heart cath as well as TEE/DCCV.  She is hemodynamically stable on of dopamine.  Will continue this overnight as well as IV Heparin gtt.  Lasix gtt started at 4mg /hr.  She is resting comfortably.  Lungs are clear anteriorly and heart is irregularly irregular but controlled rate at 95bpm.  SHe has 2+ pedal edema.

## 2014-08-15 NOTE — Progress Notes (Signed)
PA present and discussed with RN need for dopamine to continue infusing even with MAP greater than 65. Eula Listen, PA stated "keep dopamine infusing at low dose even if MAP is >65  to help with renal perfusion."

## 2014-08-15 NOTE — Progress Notes (Signed)
   08/15/14 0915  Clinical Encounter Type  Visited With Patient  Visit Type Initial  Consult/Referral To Chaplain  Spiritual Encounters  Spiritual Needs Emotional  Stress Factors  Patient Stress Factors None identified  Chaplain rounded in the unit and offered emotional and spiritual support as applicable. Chaplain Latoyna Hird A. Kenroy Timberman Ext. [phone]

## 2014-08-15 NOTE — Progress Notes (Signed)
Blood pressure stable through the afternoon on low-dose dopamine We'll start Lasix infusion 4 mg per hour Pending transfer to [facility] We'll need central line, right heart catheterization, Suspect she will need extra pressor support for TEE and cardioversion Continued aggressive diuresis as renal function permits

## 2014-08-15 NOTE — Progress Notes (Addendum)
Patient: Erin [name] / Admit Date: 08/13/2014 / Date of Encounter: 08/15/2014, 8:08 AM   Subjective: More alert today. Complains of nasal congestion. She is plus 232 mL for the admission to date. Has not been on Lasix since admission given hypotension and acute renal injury. Started on dopamine on 8/9 with improved BP into the low 100s to 120s systolic. Most recent BP 90s systolic. SCr 1.73 this morning. WBC count stable at 8.0. Echo showed EF 25-30%, diffuse HK, regional WMA could not excluded, mild MR, left atrium was mildly dilated, RV was mildly dilated, RA was mildly dilated, PASP was high normal. She is on ceftriaxone for UTI with IV fluids running.     Review of Systems: Review of Systems  Constitutional: Positive for malaise/fatigue. Negative for fever, chills, weight loss and diaphoresis.  HENT: Positive for congestion.   Eyes: Negative for discharge and redness.  Respiratory: Positive for cough, shortness of breath and wheezing. Negative for hemoptysis and sputum production.   Cardiovascular: Positive for leg swelling. Negative for chest pain, palpitations, orthopnea, claudication and PND.  Gastrointestinal: Negative for heartburn, nausea and vomiting.  Musculoskeletal: Negative for myalgias and falls.  Skin: Negative for rash.  Neurological: Positive for sensory change and weakness. Negative for dizziness, tingling, tremors, speech change and headaches.       Paraesthesia right lateral thigh   Endo/Heme/Allergies: Does not bruise/bleed easily.  Psychiatric/Behavioral: The patient is not nervous/anxious.   All other systems reviewed and are negative.   Objective: Telemetry: Afib, 80's to low 100's Physical Exam: Blood pressure 96/57, pulse 100, temperature 98.3 F (36.8 C), temperature source Oral, resp. rate 17, height 5' (1.524 m), weight 218 lb 7.6 oz (99.1 kg), SpO2 91 %. Body mass index is 42.67 kg/(m^2). General: Well developed, well nourished, in no acute  distress. Head: Normocephalic, atraumatic, sclera non-icteric, no xanthomas, nares are without discharge. Neck: Negative for carotid bruits. JVP not elevated. Lungs: Bilateral crackles with expiratory wheezing. Breathing is unlabored. Heart: Irregularly-irregular, S1 S2 without murmurs, rubs, or gallops.  Abdomen: Soft, non-tender, non-distended with normoactive bowel sounds. No rebound/guarding. Extremities: No clubbing or cyanosis. 1+ bilateral pitting edema to the thighs. Distal pedal pulses are 2+ and equal bilaterally. Neuro: Alert and oriented X 3. Moves all extremities spontaneously. Psych:  Responds to questions appropriately with a normal affect.   Intake/Output Summary (Last 24 hours) at 08/15/14 0808 Last data filed at 08/15/14 0739  Gross per 24 hour  Intake 1044.63 ml  Output    225 ml  Net 819.63 ml    Inpatient Medications:  . apixaban  5 mg Oral BID  . cefTRIAXone (ROCEPHIN)  IV  1 g Intravenous Q24H  . sodium chloride  250 mL Intravenous Once  . sodium chloride  3 mL Intravenous Q12H   Infusions:  . DOPamine 2.5 mcg/kg/min (08/15/14 0600)    Labs:  Recent Labs  08/13/14 1423 08/14/14 0506 08/15/14 0420  NA 134* 140 140  K 3.4* 4.2 4.0  CL 101 110 108  CO2 20* 24 24  GLUCOSE 118* 137* 115*  BUN 12 16 24*  CREATININE 0.85 1.50* 1.73*  CALCIUM 8.7* 8.3* 8.5*  MG 1.8  --   --    No results for input(s): AST, ALT, ALKPHOS, BILITOT, PROT, ALBUMIN in the last 72 hours.  Recent Labs  08/14/14 0506 08/15/14 0420  WBC 6.8 8.0  HGB 9.4* 9.9*  HCT 30.5* 31.6*  MCV 70.4* 70.4*  PLT 260 299  Recent Labs  08/13/14 1423 08/14/14 0506  TROPONINI <0.03 <0.03   Invalid input(s): POCBNP  Recent Labs  08/14/14 0506  HGBA1C 6.2*     Weights: Filed Weights   08/13/14 1800 08/14/14 0507 08/15/14 0500  Weight: 213 lb 3 oz (96.7 kg) 216 lb 0.8 oz (98 kg) 218 lb 7.6 oz (99.1 kg)     Radiology/Studies:  Dg Chest Portable 1 View  08/13/2014    CLINICAL DATA:  70 year old female with retention of fluid. Shortness of breath at times. Initial encounter.  EXAM: PORTABLE CHEST - 1 VIEW  COMPARISON:  None.  FINDINGS: Exam limited by portable technique and habitus.  Cardiomegaly with sequential pacemaker/ AICD in place, grossly appearing in similar position to prior exam.  Central pulmonary vascular prominence.  No obvious segmental consolidation or gross pneumothorax.  Patient would benefit from two view chest when able.  IMPRESSION: Exam limited by portable technique and habitus.  Cardiomegaly with sequential pacemaker/ AICD in place.  Central pulmonary vascular prominence.  No obvious segmental consolidation.   Electronically Signed   By: Lacy Duverney M.D.   On: 08/13/2014 15:08     Assessment and Plan  69 y.o. female with h/o nonischemic cardiomyopathy by cardiac cath in 2003 s/p AICD in 07/2012, chronic systolic CHF, PAF on warfarin, medication noncompliance, HTN, HLD, obesity, and OSA on CPAP who presented to [facility] on 8/8 with increased SOB and lower extremity edema for the past 1 week and was found to be in Afib with RVR and acute on chronic systolic CHF.  1. Acute on chronic systolic CHF: -Last known EF 25% in 2014, EF this admission 25-30% with dilated cardiomyopathy -Hypotension and acute renal injury in the setting of possible ATN vs cardiorenal syndrome have precluded diuresis thus far -Currently +232 mL to date   -Continue low dose dopamine for aided diuresis and renal perfusion  -Continue to hold diuresis at this time given bump in her renal function -Limit IV fluids, currently receiving fluids for ceftriaxone, once this infusion is complete would d/c fluids as she can take po sips -Also hold losartan given her hypotension and bump in renal function  -As her BP improves to >110 systolic would aim to change from losartan to [facility] (will need case manager consult)  2. Cardiorenal syndrome: -Patient has Positive output of 232 for  admission thus far -Continue low dose dopamine for aided diuresis and renal perfusion    3. Paroxysmal Afib with RVR: -Rate controlled currently -Coreg was held this morning in the setting of hypotension (SBP 80's) -Changed from warfarin to Eliquis in an effort to increase patient medication compliance  -Will need to consult case manager to see if she can afford Eliquis  -Worse case could consider amiodarone -CHADSVASc at least 4 (CHF, HTN, age, female) giving her an estimated annual stroke risk of 4.0%  4. Hypotension: -She continues to be hypotensive at this time with BP in the 70's over 40's -Improved on dopamine, continue at this time as above   5. Acute renal injury: -Likely ATN in the setting of hypotension  -Monitor  -Will take a few days to improve  6. Medication noncompliance: -Patient's daughter will be around to aid with medications  7. OSA: -CPAP  8. Possible UTI: -On ceftriaxone per IM -Limit IV fluids while on infusion, stop fluids once complete   SignedEula Listen, PA-C Pager: [phone] 08/15/2014, 8:08 AM   Attending Note Patient seen and examined, agree with detailed note above,  Patient  presentation and plan discussed on rounds.  Dopamine stopped by nursing overnight with systolic pressures back to the 80 range, restarted this morning. Systolics back to low 90s. -Climbing creatinine up to 1.7, up from 0.85  -Echocardiogram showing ejection fraction 20-25%, at least moderately elevated right heart pressures  --Coughing last night, significant lower extremity edema, abdominal bloating consistent with acute on chronic systolic CHF --Continued atrial fibrillation, rate improved 80s up to 100 --Most of her outpatient heart failure medications have been held secondary to hypotension  --Case discussed with patient and her daughter this morning. Currently with no central line access. Unable to estimate CVP --recent  noncompliance with warfarin will  complicate things, was started on eliquis yesterday (received 2 doses yesterday, now held) --Climb in creatinine possibly from ATN, in the setting of hypotension, unable to exclude cardiorenal  PLAN --Will start on heparin infusion --We'll discuss case with Dr. Gala Romney in [place]. --Will likely need central line access after doses of eliquis wear off, this will allow estimation of CVP, blood draws, additional pressors. --Could potentially use low dose of other agents through peripheral line for support  --Once blood pressure more stable and able to give sedatives, could consider TEE and cardioversion  --Potentially could consider aquapheresis  -----> UA is not particularly impressive though certainly possible that she has UTI contributing to low blood pressure. Could provide supportive care with central line, pressors until blood pressure stabilizes, renal function stabilizes, then pursue aggressive diuresis  and at that point restore normal sinus rhythm  Signed: Dossie Arbour  M.D., Ph.D. [facility]

## 2014-08-15 NOTE — Discharge Summary (Addendum)
[facility] - [place] at [facility]   PATIENT NAME: Erin [name]    MR#:  [id number]  DATE OF BIRTH:  20-Dec-1945  DATE OF ADMISSION:  08/13/2014 ADMITTING PHYSICIAN: Houston Siren, MD  DATE OF DISCHARGE: No discharge date for patient encounter.  PRIMARY CARE PHYSICIAN: Charlton Haws, MD     ADMISSION DIAGNOSIS:  Ventricular tachycardia [I47.2] Atrial fibrillation with rapid ventricular response [I48.91]  DISCHARGE DIAGNOSIS:  Principal Problem:   Atrial fibrillation with RVR Active Problems:   PAF (paroxysmal atrial fibrillation)   Atrial fibrillation with rapid ventricular response   Acute on chronic systolic CHF (congestive heart failure)   Arterial hypotension   Urinary tract infectious disease   Coronary artery disease, non-occlusive   H/O medication noncompliance   Anemia   Nausea with vomiting   SECONDARY DIAGNOSIS:   Past Medical History  Diagnosis Date  . Hypertension   . Chronic systolic CHF (congestive heart failure)     a. echo 2014: EF 25%, diffuse HK, LA moderately dilated, RA mildly dilated, PASP 38 mm Hg  . PAF (paroxysmal atrial fibrillation)     a. on warfarin  . Encounter for long-term (current) use of anticoagulants   . Automatic implantable cardioverter-defibrillator in situ   . High cholesterol   . Anginal pain   . Exertional shortness of breath   . OSA on CPAP     "suppose to; not often" (07/22/2012)  . Other "heavy-for-dates" infants   . NICM (nonischemic cardiomyopathy)     a. s/p AICD 07/22/2012  . Coronary artery disease, non-occlusive     a. cath 2003: LM nl, mid LAD 50%, LCx nl, RCA nl  . H/O medication noncompliance     .pro HOSPITAL COURSE:   Patient is 69 year old African female with past medical history significant for history of nonischemic cardiomyopathy with ejection fraction of around 25%, history of chronic systolic CHF, who presented to the hospital with tachycardia and shortness of breath as  well as lower extremity and abdominal swelling. She was found to be hypotensive and acute on chronic systolic congestive heart failure with pulmonary edema as well as significant anasarca. Her EKG revealed A. fib, RVR on arrival. She Cardizem was tried, however, since patient's blood pressure plummeted. She was started on dopamine, IV infusion. With this her condition somewhat stabilized, however, her urine output remained very low and her kidney function worsened over the period of time because of hypotension and poor cardiac function. Cardiology consultation was obtained and cardiologist, felt that patient would benefit from cardioversion tertiary care center such as [facility] where patient will be discharged, transferred today on 08/15/2014. Discussion by problem: 1. A. fib, RVR, paroxysmal per cardiologist, patient would benefit from cardioversion which cardiologist is recommending unfortunately, she would need TEE to rule out intracardiac clots before cardioversion. Initiated on heparin IV, continued 2. Acute on chronic systolic CHF with known cardiomyopathy, ejection fraction of 25%, echocardiogram today revealed ejection fraction of 25-30%, diffuse hypokinesis, mild MR, as well as mild-to-moderate TR, unfortunately, unable to use diuretics due to hypotension at present , urine output is poor and kidney function is worsening. Again cardioversion would be beneficial to improve cardiac output. Continue dopamine and oxygen therapy. May need to add 1 Lopressor as such as for Fidelity. Vasopressin to maintain pressures a little higher, although patient's marital pressure is above as 70 over the past 24 hours 3. Acute kidney injury due to hypotension, likely ATN. Relatively stable with therapy, UA is abnormal,  concerning for urinary tract infection, get urine cultures and initiate patient on the Keflex by mouth 4. Hypotension, cardiogenic shock , also possibly some element of urosepsis, continue dopamine  infusion, keeping map is around 65 and above. May need additional pressors 5. Anemia. Stable with therapy, no active bleeding 6. Hyperglycemia. hemoglobin A1c is 6.2. No diabetes mellitus 7. Nausea and vomiting. Supportive therapy, resolving 8. Suspected urinary tract infection with pyuria and the some hypotension, get urine cultures and initiate patient on Keflex as above  DISCHARGE CONDITIONS:   Guarded  CONSULTS OBTAINED:  Treatment Team:  Iran Ouch, MD  DRUG ALLERGIES:  No Known Allergies  DISCHARGE MEDICATIONS:   Current Discharge Medication List    START taking these medications   Details  DOPamine (INTROPIN) 3.2-5 MG/ML-% infusion Inject 0.245 mg/min into the vein continuous. Qty: 250 mL, Refills: 0    heparin 100-0.45 UNIT/ML-% infusion Inject 1,400 Units/hr into the vein continuous. Qty: 250 mL, Refills: 0    ondansetron (ZOFRAN) 4 MG tablet Take 1 tablet (4 mg total) by mouth every 6 (six) hours as needed for nausea. Qty: 20 tablet, Refills: 0      STOP taking these medications     amiodarone (PACERONE) 200 MG tablet      carvedilol (COREG) 25 MG tablet      furosemide (LASIX) 20 MG tablet      losartan (COZAAR) 100 MG tablet      spironolactone (ALDACTONE) 25 MG tablet      warfarin (COUMADIN) 5 MG tablet          DISCHARGE INSTRUCTIONS:    Follow-up with her primary care physician, Dr. Eden Emms and cardiologist  If you experience worsening of your admission symptoms, develop shortness of breath, life threatening emergency, suicidal or homicidal thoughts you must seek medical attention immediately by calling 911 or calling your MD immediately  if symptoms less severe.  You Must read complete instructions/literature along with all the possible adverse reactions/side effects for all the Medicines you take and that have been prescribed to you. Take any new Medicines after you have completely understood and accept all the possible adverse  reactions/side effects.   Please note  You were cared for by a hospitalist during your hospital stay. If you have any questions about your discharge medications or the care you received while you were in the hospital after you are discharged, you can call the unit and asked to speak with the hospitalist on call if the hospitalist that took care of you is not available. Once you are discharged, your primary care physician will handle any further medical issues. Please note that NO REFILLS for any discharge medications will be authorized once you are discharged, as it is imperative that you return to your primary care physician (or establish a relationship with a primary care physician if you do not have one) for your aftercare needs so that they can reassess your need for medications and monitor your lab values.    Today   CHIEF COMPLAINT:   Chief Complaint  Patient presents with  . Leg Swelling  . Shortness of Breath    HISTORY OF PRESENT ILLNESS:  Cintia Gleed  is a 69 y.o. female with a known history of nonischemic cardiomyopathy, chronic systolic CHF, paroxysmal atrial fibrillation, ejection fraction of around 25%, history of chronic systolic CHF, who presented to the hospital with tachycardia and shortness of breath as well as lower extremity and abdominal swelling. She was found to be  hypotensive and acute on chronic systolic congestive heart failure with pulmonary edema as well as significant anasarca. Her EKG revealed A. fib, RVR on arrival. She Cardizem was tried, however, since patient's blood pressure plummeted. She was started on dopamine, IV infusion. With this her condition somewhat stabilized, however, her urine output remained very low and her kidney function worsened over the period of time because of hypotension and poor cardiac function. Cardiology consultation was obtained and cardiologist, felt that patient would benefit from cardioversion tertiary care center such as [facility] where patient will be discharged, transferred today on 08/15/2014. Discussion by problem: 1. A. fib, RVR, paroxysmal per cardiologist, patient would benefit from cardioversion which cardiologist is recommending unfortunately, she would need TEE to rule out intracardiac clots before cardioversion. Initiated on heparin IV, continued 2. Acute on chronic systolic CHF with known cardiomyopathy, ejection fraction of 25%, echocardiogram today revealed ejection fraction of 25-30%, diffuse hypokinesis, mild MR, as well as mild-to-moderate TR, unfortunately, unable to use diuretics due to hypotension at present , urine output is poor and kidney function is worsening. Again cardioversion would be beneficial to improve cardiac output. Continue dopamine and oxygen therapy. May need to add 1 Lopressor as such as for Fidelity. Vasopressin to maintain pressures a little higher, although patient's marital pressure is above as 70 over the past 24 hours 3. Acute kidney injury due to hypotension, likely ATN. Relatively stable with therapy, UA is abnormal, concerning for urinary tract infection, get urine cultures and initiate patient on the Keflex by mouth 4. Hypotension, cardiogenic shock , also possibly some element of urosepsis, continue dopamine infusion, keeping map is around 65 and above. May need additional pressors 5. Anemia. Stable with therapy, no active bleeding 6. Hyperglycemia. hemoglobin A1c is 6.2. No diabetes mellitus 7. Nausea and vomiting. Supportive therapy, resolving 8. Suspected urinary tract infection with pyuria and the some hypotension, get urine cultures and initiate patient on Keflex as above    VITAL SIGNS:  Blood pressure 97/75, pulse 89, temperature 98.3 F (36.8 C), temperature source Oral, resp. rate 29, height 5' (1.524 m), weight 99.1 kg (218 lb 7.6 oz), SpO2 99 %.  I/O:   Intake/Output Summary (Last 24 hours) at 08/15/14 1049 Last data filed at 08/15/14 1000  Gross per 24  hour  Intake  978.6 ml  Output    575 ml  Net  403.6 ml    PHYSICAL EXAMINATION:  GENERAL:  69 y.o.-year-old patient lying in the bed with no acute distress.  EYES: Pupils equal, round, reactive to light and accommodation. No scleral icterus. Extraocular muscles intact.  HEENT: Head atraumatic, normocephalic. Oropharynx and nasopharynx clear.  NECK:  Supple, no jugular venous distention. No thyroid enlargement, no tenderness.  LUNGS: Some diminished breath sounds bilaterally, no wheezing, rales,rhonchi , few crepitations at bases. Using accessory muscles of respiration with any kind of exertion and even simple turn in the bed.  CARDIOVASCULAR: S1, S2 normal. No murmurs, rubs, or gallops.  ABDOMEN: Soft, non-tender, non-distended. Bowel sounds present. No organomegaly or mass. Abdominal wall is swollen EXTREMITIES: 3+ lower extremity and pedal edema, no cyanosis, or clubbing.  NEUROLOGIC: Cranial nerves II through XII are intact. Muscle strength 5/5 in all extremities. Sensation intact. Gait not checked.  PSYCHIATRIC: The patient is alert and oriented x 3.  SKIN: No obvious rash, lesion, or ulcer.   DATA REVIEW:   CBC  Recent Labs Lab 08/15/14 0420  WBC 8.0  HGB 9.9*  HCT 31.6*  PLT 299  Chemistries   Recent Labs Lab 08/13/14 1423  08/15/14 0420  NA 134*  < > 140  K 3.4*  < > 4.0  CL 101  < > 108  CO2 20*  < > 24  GLUCOSE 118*  < > 115*  BUN 12  < > 24*  CREATININE 0.85  < > 1.73*  CALCIUM 8.7*  < > 8.5*  MG 1.8  --   --   < > = values in this interval not displayed.  Cardiac Enzymes  Recent Labs Lab 08/14/14 0506  TROPONINI <0.03    Microbiology Results  Results for orders placed or performed during the hospital encounter of 07/22/12  Surgical pcr screen     Status: None   Collection Time: 07/22/12  5:46 AM  Result Value Ref Range Status   MRSA, PCR NEGATIVE NEGATIVE Final   Staphylococcus aureus NEGATIVE NEGATIVE Final    Comment:        The Xpert  SA Assay (FDA approved for NASAL specimens in patients over 69 years of age), is one component of a comprehensive surveillance program.  Test performance has been validated by [employer] for patients greater than or equal to 86 year old. It is not intended to diagnose infection nor to guide or monitor treatment.    RADIOLOGY:  Dg Chest Portable 1 View  08/13/2014   CLINICAL DATA:  69 year old female with retention of fluid. Shortness of breath at times. Initial encounter.  EXAM: PORTABLE CHEST - 1 VIEW  COMPARISON:  None.  FINDINGS: Exam limited by portable technique and habitus.  Cardiomegaly with sequential pacemaker/ AICD in place, grossly appearing in similar position to prior exam.  Central pulmonary vascular prominence.  No obvious segmental consolidation or gross pneumothorax.  Patient would benefit from two view chest when able.  IMPRESSION: Exam limited by portable technique and habitus.  Cardiomegaly with sequential pacemaker/ AICD in place.  Central pulmonary vascular prominence.  No obvious segmental consolidation.   Electronically Signed   By: Lacy Duverney M.D.   On: 08/13/2014 15:08    EKG:   Orders placed or performed during the hospital encounter of 08/13/14  . ED EKG within 10 minutes  . ED EKG within 10 minutes  . EKG 12-Lead  . EKG 12-Lead      Management plans discussed with the patient, family and they are in agreement.  CODE STATUS:     Code Status Orders        Start     Ordered   08/13/14 1824  Full code   Continuous     08/13/14 1823      TOTAL TIME TAKING CARE OF THIS PATIENT: 40  minutes.  Patient's care, treatment plan and discharge planning was discussed with Dr. Ezzard Flax M.D on 08/15/2014 at 10:49 AM  Between 7am to 6pm - Pager - [phone]  After 6pm go to [email or web address] - password [email or web address] [facility]  [facility]  Office  [phone]  CC: Primary care physician; Charlton Haws, MD

## 2014-08-15 NOTE — Progress Notes (Signed)
ANTICOAGULATION CONSULT NOTE - Initial Consult  Pharmacy Consult for Heparin Dosing Indication: atrial fibrillation  No Known Allergies  Patient Measurements: Height: 5' (152.4 cm) Weight: 218 lb 7.6 oz (99.1 kg) IBW/kg (Calculated) : 45.5  Vital Signs: Temp: 98.3 F (36.8 C) (08/10 0742) Temp Source: Oral (08/10 0742) BP: 82/67 mmHg (08/10 1400) Pulse Rate: 90 (08/10 1400)  Labs:  Recent Labs  08/13/14 1423 08/14/14 0506 08/15/14 0420  HGB 10.4* 9.4* 9.9*  HCT 32.8* 30.5* 31.6*  PLT 324 260 299  LABPROT 15.8*  --   --   INR 1.24  --   --   CREATININE 0.85 1.50* 1.73*  TROPONINI <0.03 <0.03  --     Estimated Creatinine Clearance: 32.4 mL/min (by C-G formula based on Cr of 1.73).   Medical History: Past Medical History  Diagnosis Date  . Hypertension   . Chronic systolic CHF (congestive heart failure)     a. echo 2014: EF 25%, diffuse HK, LA moderately dilated, RA mildly dilated, PASP 38 mm Hg; b. echo 08/2014: EF 25-30%, diffuse HK, RWMA cannot be excluded, mild MR, mild biatrial enlargement, RV mildly dilated, wall thickness nl, pacer wire/catheter noted, mild to mod TR, PASP high nl  . PAF (paroxysmal atrial fibrillation)     a. on warfarin--> Eliquis 08/2014  . Encounter for long-term (current) use of anticoagulants   . Automatic implantable cardioverter-defibrillator in situ     a. s/p MDT ICD 07/2012; b. SN # [id number]   . High cholesterol   . Anginal pain   . Exertional shortness of breath   . OSA on CPAP     "suppose to; not often" (07/22/2012)  . Other "heavy-for-dates" infants   . NICM (nonischemic cardiomyopathy)     a. s/p AICD 07/22/2012  . Coronary artery disease, non-occlusive     a. cath 2003: LM nl, mid LAD 50%, LCx nl, RCA nl  . H/O medication noncompliance   . Obesity     Medications:  Scheduled:  . [START ON 08/16/2014] cefTRIAXone (ROCEPHIN)  IV  1 g Intravenous Q24H  . sodium chloride  250 mL Intravenous Once  . sodium chloride  3 mL  Intravenous Q12H   Infusions:  . DOPamine 2.5 mcg/kg/min (08/15/14 0600)  . heparin 1,400 Units/hr (08/15/14 1045)    Assessment: Pharmacy consulted to dose heparin for 69 yo female with paroxysmal Afib with RVR. Patient previously transitioned from warfarin to Eliquis this admission. Patient last received Eliquis at 2200 on 8/9.    Goal of Therapy:  Heparin level 0.3-0.7 units/ml Monitor platelets by anticoagulation protocol: Yes   Plan:  Patient ordered 4500 unit bolus and initiated on heparin drip at 1400 units/hr. Due to patient receiving Eliquis, will manage with aPTT until aPTT and anti-Xa levels correlate. Will obtain aPTT ~6 hours after initiation at 1600. Will obtain anti-Xa level with am labs.    Pharmacy will continue to monitor and adjust per consult.    Sol Englert L 08/15/2014,3:49 PM

## 2014-08-15 NOTE — Progress Notes (Signed)
ANTICOAGULATION CONSULT NOTE - Initial Consult  Pharmacy Consult for Heparin Dosing Indication: atrial fibrillation  No Known Allergies  Patient Measurements: Height: 5' (152.4 cm) Weight: 218 lb 7.6 oz (99.1 kg) IBW/kg (Calculated) : 45.5  Vital Signs: Temp: 98.3 F (36.8 C) (08/10 0742) Temp Source: Oral (08/10 0742) BP: 126/66 mmHg (08/10 1730) Pulse Rate: 86 (08/10 1730)  Labs:  Recent Labs  08/13/14 1423 08/14/14 0506 08/15/14 0420 08/15/14 1604  HGB 10.4* 9.4* 9.9*  --   HCT 32.8* 30.5* 31.6*  --   PLT 324 260 299  --   APTT  --   --   --  158*  LABPROT 15.8*  --   --   --   INR 1.24  --   --   --   CREATININE 0.85 1.50* 1.73*  --   TROPONINI <0.03 <0.03  --   --     Estimated Creatinine Clearance: 32.4 mL/min (by C-G formula based on Cr of 1.73).   Medical History: Past Medical History  Diagnosis Date  . Hypertension   . Chronic systolic CHF (congestive heart failure)     a. echo 2014: EF 25%, diffuse HK, LA moderately dilated, RA mildly dilated, PASP 38 mm Hg; b. echo 08/2014: EF 25-30%, diffuse HK, RWMA cannot be excluded, mild MR, mild biatrial enlargement, RV mildly dilated, wall thickness nl, pacer wire/catheter noted, mild to mod TR, PASP high nl  . PAF (paroxysmal atrial fibrillation)     a. on warfarin--> Eliquis 08/2014  . Encounter for long-term (current) use of anticoagulants   . Automatic implantable cardioverter-defibrillator in situ     a. s/p MDT ICD 07/2012; b. SN # [id number]   . High cholesterol   . Anginal pain   . Exertional shortness of breath   . OSA on CPAP     "suppose to; not often" (07/22/2012)  . Other "heavy-for-dates" infants   . NICM (nonischemic cardiomyopathy)     a. s/p AICD 07/22/2012  . Coronary artery disease, non-occlusive     a. cath 2003: LM nl, mid LAD 50%, LCx nl, RCA nl  . H/O medication noncompliance   . Obesity     Medications:  Scheduled:  . [START ON 08/16/2014] cefTRIAXone (ROCEPHIN)  IV  1 g  Intravenous Q24H  . sodium chloride  250 mL Intravenous Once  . sodium chloride  3 mL Intravenous Q12H   Infusions:  . DOPamine 2.5 mcg/kg/min (08/15/14 0600)  . heparin 1,400 Units/hr (08/15/14 1045)    Assessment: Pharmacy consulted to dose heparin for 69 yo female with paroxysmal Afib with RVR. Patient previously transitioned from warfarin to Eliquis this admission. Patient last received Eliquis at 2200 on 8/9.    Goal of Therapy:  Heparin level 0.3-0.7 units/ml Monitor platelets by anticoagulation protocol: Yes   Plan:  Patient ordered 4500 unit bolus and initiated on heparin drip at 1400 units/hr. Due to patient receiving Eliquis, will manage with aPTT until aPTT and anti-Xa levels correlate. Will obtain aPTT ~6 hours after initiation at 1600. Will obtain anti-Xa level with am labs.     APTT resulted @ 158. Will reduce heparin rate by 100 units/hr. New order in for heparin 1300 units/hr. Next APTT scheduled to be drawn @ 00:00.  Pharmacy will continue to monitor and adjust per consult.    Shivank Pinedo D 08/15/2014,6:00 PM

## 2014-08-15 NOTE — Progress Notes (Signed)
Patient A&Ox4. Daughter at bedside and aware that patient just got a room at [facility] and will be leaving [facility] within an hour. Blood pressure stable on dopamine drip. Afib per cardiac monitor. No distress noted. Report given to [place], Charity fundraiser.

## 2014-08-15 NOTE — Telephone Encounter (Signed)
l mom to schedule ED f/u 

## 2014-08-16 ENCOUNTER — Encounter (HOSPITAL_COMMUNITY): Payer: Self-pay | Admitting: *Deleted

## 2014-08-16 DIAGNOSIS — I959 Hypotension, unspecified: Secondary | ICD-10-CM

## 2014-08-16 DIAGNOSIS — D509 Iron deficiency anemia, unspecified: Secondary | ICD-10-CM

## 2014-08-16 DIAGNOSIS — N3000 Acute cystitis without hematuria: Secondary | ICD-10-CM

## 2014-08-16 DIAGNOSIS — I429 Cardiomyopathy, unspecified: Secondary | ICD-10-CM

## 2014-08-16 LAB — COMPREHENSIVE METABOLIC PANEL
ALBUMIN: 3.1 g/dL — AB (ref 3.5–5.0)
ALK PHOS: 52 U/L (ref 38–126)
ALK PHOS: 57 U/L (ref 38–126)
ALT: 24 U/L (ref 14–54)
ALT: 26 U/L (ref 14–54)
ANION GAP: 10 (ref 5–15)
ANION GAP: 9 (ref 5–15)
AST: 27 U/L (ref 15–41)
AST: 29 U/L (ref 15–41)
Albumin: 3.1 g/dL — ABNORMAL LOW (ref 3.5–5.0)
BILIRUBIN TOTAL: 0.9 mg/dL (ref 0.3–1.2)
BILIRUBIN TOTAL: 1.1 mg/dL (ref 0.3–1.2)
BUN: 19 mg/dL (ref 6–20)
BUN: 20 mg/dL (ref 6–20)
CHLORIDE: 107 mmol/L (ref 101–111)
CO2: 22 mmol/L (ref 22–32)
CO2: 23 mmol/L (ref 22–32)
CREATININE: 1.32 mg/dL — AB (ref 0.44–1.00)
Calcium: 8.6 mg/dL — ABNORMAL LOW (ref 8.9–10.3)
Calcium: 8.6 mg/dL — ABNORMAL LOW (ref 8.9–10.3)
Chloride: 105 mmol/L (ref 101–111)
Creatinine, Ser: 1.17 mg/dL — ABNORMAL HIGH (ref 0.44–1.00)
GFR calc Af Amer: 47 mL/min — ABNORMAL LOW (ref >60)
GFR, EST AFRICAN AMERICAN: 54 mL/min — AB (ref >60)
GFR, EST NON AFRICAN AMERICAN: 46 mL/min — AB (ref >60)
GFR, EST NON AFRICAN AMERICAN: 40 mL/min — AB (ref >60)
GLUCOSE: 117 mg/dL — AB (ref 65–99)
GLUCOSE: 138 mg/dL — AB (ref 65–99)
POTASSIUM: 4.1 mmol/L (ref 3.5–5.1)
Potassium: 4.2 mmol/L (ref 3.5–5.1)
Sodium: 138 mmol/L (ref 135–145)
Sodium: 138 mmol/L (ref 135–145)
Total Protein: 6.3 g/dL — ABNORMAL LOW (ref 6.5–8.1)
Total Protein: 6.5 g/dL (ref 6.5–8.1)

## 2014-08-16 LAB — PROTIME-INR
INR: 1.44 (ref 0.00–1.49)
PROTHROMBIN TIME: 17.6 s — AB (ref 11.6–15.2)

## 2014-08-16 LAB — IRON AND TIBC
IRON: 21 ug/dL — AB (ref 28–170)
Saturation Ratios: 4 % — ABNORMAL LOW (ref 10.4–31.8)
TIBC: 484 ug/dL — AB (ref 250–450)
UIBC: 463 ug/dL

## 2014-08-16 LAB — APTT
APTT: 104 s — AB (ref 24–37)
aPTT: 75 seconds — ABNORMAL HIGH (ref 24–37)

## 2014-08-16 LAB — BASIC METABOLIC PANEL
Anion gap: 10 (ref 5–15)
BUN: 17 mg/dL (ref 6–20)
CHLORIDE: 103 mmol/L (ref 101–111)
CO2: 27 mmol/L (ref 22–32)
CREATININE: 1.11 mg/dL — AB (ref 0.44–1.00)
Calcium: 9.1 mg/dL (ref 8.9–10.3)
GFR calc Af Amer: 57 mL/min — ABNORMAL LOW (ref >60)
GFR calc non Af Amer: 49 mL/min — ABNORMAL LOW (ref >60)
GLUCOSE: 104 mg/dL — AB (ref 65–99)
POTASSIUM: 4 mmol/L (ref 3.5–5.1)
Sodium: 140 mmol/L (ref 135–145)

## 2014-08-16 LAB — MAGNESIUM: Magnesium: 2.1 mg/dL (ref 1.7–2.4)

## 2014-08-16 LAB — BRAIN NATRIURETIC PEPTIDE: B Natriuretic Peptide: 641.9 pg/mL — ABNORMAL HIGH (ref 0.0–100.0)

## 2014-08-16 LAB — HEPARIN LEVEL (UNFRACTIONATED): Heparin Unfractionated: 1.07 IU/mL — ABNORMAL HIGH (ref 0.30–0.70)

## 2014-08-16 MED ORDER — CARVEDILOL 3.125 MG PO TABS
3.1250 mg | ORAL_TABLET | Freq: Two times a day (BID) | ORAL | Status: DC
  Administered 2014-08-16 (×2): 3.125 mg via ORAL
  Filled 2014-08-16 (×4): qty 1

## 2014-08-16 MED ORDER — AMIODARONE HCL 200 MG PO TABS
200.0000 mg | ORAL_TABLET | Freq: Two times a day (BID) | ORAL | Status: DC
  Administered 2014-08-16 – 2014-08-22 (×13): 200 mg via ORAL
  Filled 2014-08-16 (×16): qty 1

## 2014-08-16 NOTE — Progress Notes (Signed)
Subjective:  No chest pain; breathing better  Objective:   Vital Signs : Filed Vitals:   08/16/14 0415 08/16/14 0500 08/16/14 0600 08/16/14 0756  BP:  114/67 141/63 136/85  Pulse:  94 91 111  Temp:    98 F (36.7 C)  TempSrc:    Oral  Resp:  '19 19 24  ' Height:      Weight: 217 lb 2.5 oz (98.5 kg)     SpO2:  95% 95% 97%    Intake/Output from previous day:  Intake/Output Summary (Last 24 hours) at 08/16/14 0849 Last data filed at 08/16/14 0600  Gross per 24 hour  Intake 160.55 ml  Output   2700 ml  Net -2539.45 ml    I/O since admission: -2539  Wt Readings from Last 3 Encounters:  08/16/14 217 lb 2.5 oz (98.5 kg)  08/15/14 218 lb 7.6 oz (99.1 kg)  08/22/13 209 lb 12 oz (95.142 kg)    Medications: . cephALEXin  250 mg Oral TID  . sodium chloride  3 mL Intravenous Q12H    . DOPamine 2 mcg/kg/min (08/15/14 2230)  . furosemide (LASIX) infusion 4 mg/hr (08/15/14 2242)  . heparin 1,200 Units/hr (08/16/14 0117)    Physical Exam:   General appearance: alert, cooperative and no distress Neck: no adenopathy, no JVD and supple, symmetrical, trachea midline Lungs: decreased BS no wheezing Heart: irregularly irregular rhythm Abdomen: soft, non-tender; bowel sounds normal; no masses,  no organomegaly Extremities: no edema, redness or tenderness in the calves or thighs and 1+ edema Pulses: 2+ and symmetric Skin: Skin color, texture, turgor normal. No rashes or lesions Neurologic: Grossly normal   Rate:106  Rhythm: atrial fibrillation  ECG (independently read by me): AF at 137, NSSTT changes  Lab Results:   Recent Labs  08/13/14 1423  08/15/14 2345 08/16/14 0442 08/16/14 0744  NA 134*  < > 138 138 140  K 3.4*  < > 4.2 4.1 4.0  CL 101  < > 107 105 103  CO2 20*  < > '22 23 27  ' GLUCOSE 118*  < > 138* 117* 104*  BUN 12  < > '20 19 17  ' CREATININE 0.85  < > 1.32* 1.17* 1.11*  CALCIUM 8.7*  < > 8.6* 8.6* 9.1  MG 1.8  --  2.1  --   --   < > = values in this  interval not displayed.  Hepatic Function Latest Ref Rng 08/16/2014 08/15/2014 08/22/2013  Total Protein 6.5 - 8.1 g/dL 6.5 6.3(L) 7.4  Albumin 3.5 - 5.0 g/dL 3.1(L) 3.1(L) -  AST 15 - 41 U/L '27 29 27  ' ALT 14 - 54 U/L '24 26 30  ' Alk Phosphatase 38 - 126 U/L 52 57 69  Total Bilirubin 0.3 - 1.2 mg/dL 0.9 1.1 0.6  Bilirubin, Direct 0.0 - 0.3 mg/dL - - -     Recent Labs  08/14/14 0506 08/15/14 0420 08/15/14 2345  WBC 6.8 8.0 8.1  NEUTROABS  --   --  4.1  HGB 9.4* 9.9* 10.4*  HCT 30.5* 31.6* 33.0*  MCV 70.4* 70.4* 71.0*  PLT 260 299 295        Recent Labs  08/13/14 1423 08/14/14 0506  TROPONINI <0.03 <0.03    Lab Results  Component Value Date   TSH 4.920* 08/22/2013    Recent Labs  08/14/14 0506  HGBA1C 6.2*      Recent Labs  08/15/14 2345  INR 1.44   BNP (last 3 results)  Recent  Labs  08/15/14 2345  BNP 641.9*    ProBNP (last 3 results) No results for input(s): PROBNP in the last 8760 hours.   Lipid Panel  No results found for: CHOL, TRIG, HDL, CHOLHDL, VLDL, LDLCALC, LDLDIRECT   Imaging:   08/13/2014 Echo Study Conclusions  - Left ventricle: The cavity size was normal. Systolic function was severely reduced. The estimated ejection fraction was in the range of 25% to 30%. Diffuse hypokinesis. Regional wall motion abnormalities cannot be excluded. - Mitral valve: There was mild regurgitation. - Left atrium: The atrium was mildly dilated. - Right ventricle: The cavity size was mildly dilated. Wall thickness was normal. Pacer wire or catheter noted in right ventricle. Systolic function was normal. - Right atrium: The atrium was mildly dilated. - Tricuspid valve: There was mild-moderate regurgitation. - Pulmonary arteries: Systolic pressure was high normal.  Impressions:  - Rhythm is atrial fibrillation.   Assessment/Plan:   Active Problems:   Acute on chronic systolic and diastolic heart failure, NYHA class 4  1. Acute on  chronic systolic and diastolic dysfunction with  h/o nonischemic cardiomyopathy , cath in 2003. echo in 2014, EF 25 - 30%, echo 08/13/14 25 -30% 2. AF; rate in 100's; has been on eliquis, last dose was 8/9 at 21:30 3. Hypotension: resolved, had dropped to 18'A systolic; outpatient meds were held and started on dopamine 3. Renal insufficiency; Cr yesterday 1.73 at 8/10 at 4: 20 in am in [place], today 1.11; she has been on dopamine currently 2 micrograms/k; losartan and aldactone has been d/c'd.  4. UTI: on cephalexin 5. Microcytic Anemia with MCV 70; check iron studies 6. Peripheral edema  With BP now 149/68 will wean and dc dopamine as BP allows.  AF rate ~110;  Will try to resume low dose carvedilol as BP tolerates. Defer cath today; ? R and L heart cath tomorrow since regional wall motion abnormalities cannot be excluded and 13 yrs since last cath vs R heart cath alone, after off eliquis for 48 hrs and allow more time for renal fxn recovery.  Ultimately plan for restoration of sinus rhythm with cardioversion. Had been on coumadin prior to eliquis.  Will resume amiodarone 200 mg bid today ( was on 200 mg daily prior to [place] admission).   Time spent 40 min  Troy Sine, MD, [facility] 08/16/2014, 8:49 AM

## 2014-08-16 NOTE — Progress Notes (Signed)
Foley catheter inserted using sterile technique with Charis RN after order confirmation and foley huddle. Clear yellow urine returned, foley secured to leg with securing device. Peri care was preformed before and after insertion.

## 2014-08-16 NOTE — Progress Notes (Signed)
ANTICOAGULATION CONSULT NOTE - Follow-up Consult  Pharmacy Consult for Heparin Indication: atrial fibrillation  No Known Allergies  Patient Measurements:    Ht: 60 in  Wt: 99 kg  IBW: 46 kg Heparin dosing wt: 70 kg  Vital Signs: Temp: 98.1 F (36.7 C) (08/10 2115) Temp Source: Oral (08/10 2115) BP: 158/90 mmHg (08/10 2115) Pulse Rate: 101 (08/10 2115)  Labs:  Recent Labs  08/13/14 1423 08/14/14 0506 08/15/14 0420 08/15/14 1604 08/15/14 2345  HGB 10.4* 9.4* 9.9*  --  10.4*  HCT 32.8* 30.5* 31.6*  --  33.0*  PLT 324 260 299  --  295  APTT  --   --   --  158* 104*  LABPROT 15.8*  --   --   --  17.6*  INR 1.24  --   --   --  1.44  CREATININE 0.85 1.50* 1.73*  --  1.32*  TROPONINI <0.03 <0.03  --   --   --     Estimated Creatinine Clearance: 42.5 mL/min (by C-G formula based on Cr of 1.32).  Assessment: Pt on heparin for afib while Eliquis on hold for possible cath. Pt last received Eliquis at 2200 on 8/9. aPTT remains slightly above goal (104) on 1300 units/hr. Currently utilizing aPTT to monitor heparin dosing as Eliquis likely still affecting anti-Xa level. CBC stable. No bleeding noted. Plan for TEE/DCCV.  Goal of Therapy:  Heparin level 0.3-0.7 units/ml; aPTT 66-102 sec Monitor platelets by anticoagulation protocol: Yes   Plan:  Decrease heparin gtt to 1200 units/hr F/u 6 hr aPTT and heparin level  Christoper Fabian, PharmD, BCPS Clinical pharmacist, pager [phone] 08/16/2014,12:44 AM

## 2014-08-16 NOTE — Progress Notes (Addendum)
ANTICOAGULATION CONSULT NOTE - Follow-up Consult  Pharmacy Consult for Heparin Indication: atrial fibrillation  No Known Allergies  Patient Measurements: Height: 5' (152.4 cm) Weight: 217 lb 2.5 oz (98.5 kg) IBW/kg (Calculated) : 45.5  Ht: 60 in  Wt: 99 kg  IBW: 46 kg Heparin dosing wt: 70 kg  Vital Signs: Temp: 98 F (36.7 C) (08/11 0756) Temp Source: Oral (08/11 0756) BP: 149/78 mmHg (08/11 0800) Pulse Rate: 88 (08/11 0800)  Labs:  Recent Labs  08/13/14 1423 08/14/14 0506 08/15/14 0420 08/15/14 1604 08/15/14 2345 08/16/14 0442 08/16/14 0744  HGB 10.4* 9.4* 9.9*  --  10.4*  --   --   HCT 32.8* 30.5* 31.6*  --  33.0*  --   --   PLT 324 260 299  --  295  --   --   APTT  --   --   --  158* 104*  --  75*  LABPROT 15.8*  --   --   --  17.6*  --   --   INR 1.24  --   --   --  1.44  --   --   HEPARINUNFRC  --   --   --   --   --   --  1.07*  CREATININE 0.85 1.50* 1.73*  --  1.32* 1.17* 1.11*  TROPONINI <0.03 <0.03  --   --   --   --   --     Estimated Creatinine Clearance: 50.4 mL/min (by C-G formula based on Cr of 1.11).  Assessment: 69 yo female from [place] with afib. She was on coumadin previously and has been switched to apixiban (Pt last received Eliquis at 2200 on 8/9).  Pt is now on heparin for afib while Eliquis on hold for possible cath. APTT is at goal (75) on 1200 units/hr. Currently utilizing aPTT to monitor heparin dosing as Eliquis likely still affecting anti-Xa level (noted 1.07 this am). CBC stable. No bleeding noted. Plans for TEE/DCCV and RHC.  Goal of Therapy:  Heparin level 0.3-0.7 units/ml; aPTT 66-102 sec Monitor platelets by anticoagulation protocol: Yes   Plan:  -No heparin changes needed -Daily CBC, heparin level and aPTT  Harland German, Pharm D 08/16/2014 9:25 AM   Addendum: Heparin  -heparin to restart 6 hours post sheath removal -sheath removed at 2:12pm and TR band applied -plans noted for DCCV  Plan -restart heparin at 1200  units/hr at 6:30pm -will follow-up on TR band removal -heparin level and aPTT in am with CBC  Harland German, Pharm D 08/17/2014 2:25 PM

## 2014-08-16 NOTE — Progress Notes (Signed)
Utilization review completed. Sabree Nuon, RN, BSN. 

## 2014-08-17 ENCOUNTER — Encounter (HOSPITAL_COMMUNITY)
Admission: AD | Disposition: A | Discharge status: Home / Self Care | Payer: PRIVATE HEALTH INSURANCE | Source: Other Acute Inpatient Hospital | Attending: Internal Medicine

## 2014-08-17 ENCOUNTER — Encounter (HOSPITAL_COMMUNITY): Payer: Self-pay | Admitting: Cardiovascular Disease

## 2014-08-17 DIAGNOSIS — I420 Dilated cardiomyopathy: Secondary | ICD-10-CM

## 2014-08-17 DIAGNOSIS — I25118 Atherosclerotic heart disease of native coronary artery with other forms of angina pectoris: Secondary | ICD-10-CM | POA: Insufficient documentation

## 2014-08-17 HISTORY — PX: CARDIAC CATHETERIZATION: SHX172

## 2014-08-17 LAB — HEPARIN LEVEL (UNFRACTIONATED): Heparin Unfractionated: 0.67 IU/mL (ref 0.30–0.70)

## 2014-08-17 LAB — BASIC METABOLIC PANEL
ANION GAP: 11 (ref 5–15)
BUN: 19 mg/dL (ref 6–20)
CO2: 25 mmol/L (ref 22–32)
Calcium: 8.4 mg/dL — ABNORMAL LOW (ref 8.9–10.3)
Chloride: 102 mmol/L (ref 101–111)
Creatinine, Ser: 1.18 mg/dL — ABNORMAL HIGH (ref 0.44–1.00)
GFR, EST AFRICAN AMERICAN: 53 mL/min — AB (ref >60)
GFR, EST NON AFRICAN AMERICAN: 46 mL/min — AB (ref >60)
Glucose, Bld: 117 mg/dL — ABNORMAL HIGH (ref 65–99)
POTASSIUM: 4.1 mmol/L (ref 3.5–5.1)
Sodium: 138 mmol/L (ref 135–145)

## 2014-08-17 LAB — CBC
HCT: 31.8 % — ABNORMAL LOW (ref 36.0–46.0)
Hemoglobin: 9.9 g/dL — ABNORMAL LOW (ref 12.0–15.0)
MCH: 22.2 pg — ABNORMAL LOW (ref 26.0–34.0)
MCHC: 31.1 g/dL (ref 30.0–36.0)
MCV: 71.5 fL — AB (ref 78.0–100.0)
PLATELETS: 269 10*3/uL (ref 150–400)
RBC: 4.45 MIL/uL (ref 3.87–5.11)
RDW: 17.3 % — AB (ref 11.5–15.5)
WBC: 9 10*3/uL (ref 4.0–10.5)

## 2014-08-17 LAB — POCT I-STAT 3, VENOUS BLOOD GAS (G3P V)
Acid-Base Excess: 3 mmol/L — ABNORMAL HIGH (ref 0.0–2.0)
BICARBONATE: 28.9 meq/L — AB (ref 20.0–24.0)
O2 Saturation: 48 %
PH VEN: 7.396 — AB (ref 7.250–7.300)
TCO2: 30 mmol/L (ref 0–100)
pCO2, Ven: 47.1 mmHg (ref 45.0–50.0)
pO2, Ven: 26 mmHg — CL (ref 30.0–45.0)

## 2014-08-17 LAB — URINE CULTURE: Culture: >=100000

## 2014-08-17 LAB — POCT I-STAT 3, ART BLOOD GAS (G3+)
Acid-Base Excess: 1 mmol/L (ref 0.0–2.0)
Bicarbonate: 25.1 mEq/L — ABNORMAL HIGH (ref 20.0–24.0)
O2 Saturation: 95 %
PH ART: 7.423 (ref 7.350–7.450)
TCO2: 26 mmol/L (ref 0–100)
pCO2 arterial: 38.4 mmHg (ref 35.0–45.0)
pO2, Arterial: 76 mmHg — ABNORMAL LOW (ref 80.0–100.0)

## 2014-08-17 LAB — APTT: aPTT: 88 seconds — ABNORMAL HIGH (ref 24–37)

## 2014-08-17 LAB — POCT ACTIVATED CLOTTING TIME: Activated Clotting Time: 257 seconds

## 2014-08-17 SURGERY — RIGHT/LEFT HEART CATH AND CORONARY ANGIOGRAPHY

## 2014-08-17 MED ORDER — HEPARIN (PORCINE) IN NACL 2-0.9 UNIT/ML-% IJ SOLN
INTRAMUSCULAR | Status: AC
  Filled 2014-08-17: qty 1000

## 2014-08-17 MED ORDER — SODIUM CHLORIDE 0.9 % IV SOLN: INTRAVENOUS | Status: AC

## 2014-08-17 MED ORDER — POLYSACCHARIDE IRON COMPLEX 150 MG PO CAPS
150.0000 mg | ORAL_CAPSULE | Freq: Two times a day (BID) | ORAL | Status: DC
  Administered 2014-08-17 – 2014-08-22 (×11): 150 mg via ORAL
  Filled 2014-08-17 (×14): qty 1

## 2014-08-17 MED ORDER — MIDAZOLAM HCL 2 MG/2ML IJ SOLN
INTRAMUSCULAR | Status: DC | PRN
  Administered 2014-08-17 (×2): 1 mg via INTRAVENOUS

## 2014-08-17 MED ORDER — HEPARIN SODIUM (PORCINE) 1000 UNIT/ML IJ SOLN
INTRAMUSCULAR | Status: DC | PRN
  Administered 2014-08-17 (×2): 5000 [IU] via INTRAVENOUS

## 2014-08-17 MED ORDER — VERAPAMIL HCL 2.5 MG/ML IV SOLN
INTRAVENOUS | Status: AC
  Filled 2014-08-17: qty 2

## 2014-08-17 MED ORDER — FENTANYL CITRATE (PF) 100 MCG/2ML IJ SOLN
INTRAMUSCULAR | Status: DC | PRN
  Administered 2014-08-17: 25 ug via INTRAVENOUS

## 2014-08-17 MED ORDER — SODIUM CHLORIDE 0.9 % IV SOLN
INTRAVENOUS | Status: DC
  Administered 2014-08-17: 08:00:00 via INTRAVENOUS

## 2014-08-17 MED ORDER — LIDOCAINE HCL (PF) 1 % IJ SOLN
INTRAMUSCULAR | Status: DC | PRN
Start: 2014-08-17 — End: 2014-08-17
  Administered 2014-08-17: 25 mL via INTRADERMAL

## 2014-08-17 MED ORDER — SODIUM CHLORIDE 0.9 % IV SOLN
250.0000 mL | INTRAVENOUS | Status: DC | PRN
  Administered 2014-08-17: 250 mL via INTRAVENOUS

## 2014-08-17 MED ORDER — SODIUM CHLORIDE 0.9 % IJ SOLN
3.0000 mL | Freq: Two times a day (BID) | INTRAMUSCULAR | Status: DC
  Administered 2014-08-18 – 2014-08-21 (×7): 3 mL via INTRAVENOUS

## 2014-08-17 MED ORDER — HEPARIN (PORCINE) IN NACL 100-0.45 UNIT/ML-% IJ SOLN
1200.0000 [IU]/h | INTRAMUSCULAR | Status: DC
  Administered 2014-08-17: 1200 [IU]/h via INTRAVENOUS
  Filled 2014-08-17 (×2): qty 250

## 2014-08-17 MED ORDER — HEPARIN SODIUM (PORCINE) 1000 UNIT/ML IJ SOLN
INTRAMUSCULAR | Status: AC
  Filled 2014-08-17: qty 1

## 2014-08-17 MED ORDER — ASPIRIN 81 MG PO CHEW: 81.0000 mg | CHEWABLE_TABLET | ORAL | Status: DC

## 2014-08-17 MED ORDER — ADENOSINE (DIAGNOSTIC) 140MCG/KG/MIN
INTRAVENOUS | Status: DC | PRN
  Administered 2014-08-17: 140.69 ug/kg/min via INTRAVENOUS

## 2014-08-17 MED ORDER — SODIUM CHLORIDE 0.9 % IJ SOLN
INTRAMUSCULAR | Status: DC | PRN
  Administered 2014-08-17: 14:00:00 via INTRA_ARTERIAL

## 2014-08-17 MED ORDER — SODIUM CHLORIDE 0.9 % IV SOLN: 250.0000 mL | INTRAVENOUS | Status: DC | PRN

## 2014-08-17 MED ORDER — NITROGLYCERIN 1 MG/10 ML FOR IR/CATH LAB
INTRA_ARTERIAL | Status: AC
  Filled 2014-08-17: qty 10

## 2014-08-17 MED ORDER — SODIUM CHLORIDE 0.9 % IJ SOLN: 3.0000 mL | INTRAMUSCULAR | Status: DC | PRN

## 2014-08-17 MED ORDER — IOHEXOL 350 MG/ML SOLN
INTRAVENOUS | Status: DC | PRN
  Administered 2014-08-17: 40 mL via INTRA_ARTERIAL

## 2014-08-17 MED ORDER — ADENOSINE 12 MG/4ML IV SOLN
16.0000 mL | Freq: Once | INTRAVENOUS | Status: DC
  Filled 2014-08-17: qty 16

## 2014-08-17 MED ORDER — FENTANYL CITRATE (PF) 100 MCG/2ML IJ SOLN
INTRAMUSCULAR | Status: AC
  Filled 2014-08-17: qty 4

## 2014-08-17 MED ORDER — LIDOCAINE HCL (PF) 1 % IJ SOLN
INTRAMUSCULAR | Status: AC
  Filled 2014-08-17: qty 30

## 2014-08-17 MED ORDER — SODIUM CHLORIDE 0.9 % IJ SOLN: 3.0000 mL | Freq: Two times a day (BID) | INTRAMUSCULAR | Status: DC

## 2014-08-17 MED ORDER — CARVEDILOL 6.25 MG PO TABS
6.2500 mg | ORAL_TABLET | Freq: Two times a day (BID) | ORAL | Status: DC
  Administered 2014-08-17 – 2014-08-22 (×10): 6.25 mg via ORAL
  Filled 2014-08-17 (×12): qty 1

## 2014-08-17 MED ORDER — MIDAZOLAM HCL 2 MG/2ML IJ SOLN
INTRAMUSCULAR | Status: AC
  Filled 2014-08-17: qty 4

## 2014-08-17 SURGICAL SUPPLY — 21 items
CATH EXPO 5F MPA-1 (CATHETERS) ×3 IMPLANT
CATH INFINITI 5 FR JL3.5 (CATHETERS) ×3 IMPLANT
CATH INFINITI JR4 5F (CATHETERS) ×3 IMPLANT
CATH MICROCATH NAVVUS (MICROCATHETER) ×1 IMPLANT
CATH SWAN GANZ 7F STRAIGHT (CATHETERS) ×3 IMPLANT
CATH VISTA GUIDE 6FR XBLAD3.5 (CATHETERS) ×3 IMPLANT
DEVICE RAD COMP TR BAND LRG (VASCULAR PRODUCTS) ×3 IMPLANT
GLIDESHEATH SLEND SS 6F .021 (SHEATH) ×3 IMPLANT
KIT ESSENTIALS PG (KITS) ×3 IMPLANT
KIT HEART LEFT (KITS) ×3 IMPLANT
KIT HEART RIGHT NAMIC (KITS) ×3 IMPLANT
MICROCATHETER NAVVUS (MICROCATHETER) ×3
PACK CARDIAC CATHETERIZATION (CUSTOM PROCEDURE TRAY) ×3 IMPLANT
SHEATH PINNACLE 7F 10CM (SHEATH) ×3 IMPLANT
SYR MEDRAD MARK V 150ML (SYRINGE) IMPLANT
TRANSDUCER W/STOPCOCK (MISCELLANEOUS) ×3 IMPLANT
TUBING CIL FLEX 10 FLL-RA (TUBING) ×3 IMPLANT
WIRE COUGAR XT STRL 190CM (WIRE) ×3 IMPLANT
WIRE EMERALD 3MM-J .025X260CM (WIRE) ×3 IMPLANT
WIRE EMERALD 3MM-J .035X150CM (WIRE) ×3 IMPLANT
WIRE SAFE-T 1.5MM-J .035X260CM (WIRE) ×3 IMPLANT

## 2014-08-17 NOTE — Interval H&P Note (Signed)
History and Physical Interval Note:  08/17/2014 12:39 PM  [name]  has presented today for cardiac cath with the diagnosis of cardiomyopathy.  The various methods of treatment have been discussed with the patient and family. After consideration of risks, benefits and other options for treatment, the patient has consented to  Procedure(s): Right/Left Heart Cath and Coronary Angiography (N/A) as a surgical intervention .  The patient's history has been reviewed, patient examined, no change in status, stable for surgery.  I have reviewed the patient's chart and labs.  Questions were answered to the patient's satisfaction.    Cath Lab Visit (complete for each Cath Lab visit)  Clinical Evaluation Leading to the Procedure:   ACS: No.  Non-ACS:    Anginal Classification: CCS II  Anti-ischemic medical therapy: Maximal Therapy (2 or more classes of medications)  Non-Invasive Test Results: No non-invasive testing performed  Prior CABG: No previous CABG         MCALHANY,CHRISTOPHER

## 2014-08-17 NOTE — H&P (View-Only) (Signed)
Subjective:  No chest pain; breathing better  Objective:   Vital Signs : Filed Vitals:   08/17/14 0400 08/17/14 0500 08/17/14 0600 08/17/14 0700  BP: 123/93 127/81 128/73 135/110  Pulse: 104 99 98 94  Temp: 98.3 F (36.8 C)     TempSrc: Oral     Resp: _0 Height:      Weight:  210 lb 11.2 oz (95.573 kg)    SpO2: 96% 97% 99% 98%    Intake/Output from previous day:  Intake/Output Summary (Last 24 hours) at 08/17/14 0804 Last data filed at 08/17/14 0700  Gross per 24 hour  Intake  611.6 ml  Output   3980 ml  Net -3368.4 ml    I/O since admission: -5748   Wt Readings from Last 3 Encounters:  08/17/14 210 lb 11.2 oz (95.573 kg)  08/15/14 218 lb 7.6 oz (99.1 kg)  08/22/13 209 lb 12 oz (95.142 kg)    Medications: . amiodarone  200 mg Oral BID  . carvedilol  3.125 mg Oral BID WC  . cephALEXin  250 mg Oral TID  . sodium chloride  3 mL Intravenous Q12H    . DOPamine Stopped (08/16/14 1100)  . furosemide (LASIX) infusion 4 mg/hr (08/15/14 2242)  . heparin 1,200 Units/hr (08/16/14 2223)    Physical Exam:   General appearance: alert, cooperative and no distress Neck: no adenopathy, no JVD and supple, symmetrical, trachea midline Lungs: decreased BS no wheezing Heart: irregularly irregular rhythm 1/6 systolic murmur; no rub Abdomen: soft, non-tender; bowel sounds normal; no masses,  no organomegaly Extremities:  Trace pitting edema LE improved from yesterday ,  noredness or tenderness in the calves or thighs Pulses: 2+ and symmetric Skin: Skin color, texture, turgor normal. No rashes or lesions Neurologic: Grossly normal   Rate: 95-105  Rhythm: atrial fibrillation  ECG (independently read by me): AF at 137, NSSTT changes  Lab Results:   Recent Labs  08/15/14 2345 08/16/14 0442 08/16/14 0744 08/17/14 0222  NA 138 138 140 138  K 4.2 4.1 4.0 4.1  CL 107 105 103 102  CO2 _1 GLUCOSE 138* 117* 104* 117*  BUN _2 CREATININE  1.32* 1.17* 1.11* 1.18*  CALCIUM 8.6* 8.6* 9.1 8.4*  MG 2.1  --   --   --     Hepatic Function Latest Ref Rng 08/16/2014 08/15/2014 08/22/2013  Total Protein 6.5 - 8.1 g/dL 6.5 6.3(L) 7.4  Albumin 3.5 - 5.0 g/dL 3.1(L) 3.1(L) -  AST 15 - 41 U/L _3 ALT 14 - 54 U/L _4 Alk Phosphatase 38 - 126 U/L 52 57 69  Total Bilirubin 0.3 - 1.2 mg/dL 0.9 1.1 0.6  Bilirubin, Direct 0.0 - 0.3 mg/dL - - -     Recent Labs  08/15/14 0420 08/15/14 2345 08/17/14 0222  WBC 8.0 8.1 9.0  NEUTROABS  --  4.1  --   HGB 9.9* 10.4* 9.9*  HCT 31.6* 33.0* 31.8*  MCV 70.4* 71.0* 71.5*  PLT 299 295 269      Iron: 21 TIBC: 484 Saturation: 4%   No results for input(s): TROPONINI in the last 72 hours.  Invalid input(s): CK, MB  Lab Results  Component Value Date   TSH 4.920* 08/22/2013   No results for input(s): HGBA1C in the last 72 hours.    Recent Labs  08/15/14 2345  INR 1.44   BNP (last 3 results)  Recent Labs  08/15/14 2345  BNP 641.9*    ProBNP (last 3 results) No results for input(s): PROBNP in the last 8760 hours.   Lipid Panel  No results found for: CHOL, TRIG, HDL, CHOLHDL, VLDL, LDLCALC, LDLDIRECT   Imaging:   08/13/2014 Echo Study Conclusions  - Left ventricle: The cavity size was normal. Systolic function was severely reduced. The estimated ejection fraction was in the range of 25% to 30%. Diffuse hypokinesis. Regional wall motion abnormalities cannot be excluded. - Mitral valve: There was mild regurgitation. - Left atrium: The atrium was mildly dilated. - Right ventricle: The cavity size was mildly dilated. Wall thickness was normal. Pacer wire or catheter noted in right ventricle. Systolic function was normal. - Right atrium: The atrium was mildly dilated. - Tricuspid valve: There was mild-moderate regurgitation. - Pulmonary arteries: Systolic pressure was high normal.  Impressions:  - Rhythm is atrial  fibrillation.   Assessment/Plan:   Active Problems:   Acute on chronic systolic and diastolic heart failure, NYHA class 4  1. Acute on chronic systolic and diastolic dysfunction with  h/o nonischemic cardiomyopathy , cath in 2003. echo in 2014, EF 25 - 30%, echo 08/13/14 25 -30% 2. AF; rate improved to 90 - 100; has been on eliquis, last dose was 8/9 at 21:30;  Amiodarone restarted yesterday at 200 mg bid 3. Hypotension: resolved, had dropped to 53'Z systolic; outpatient meds were held and started on dopamine; now off pressors 3. Renal insufficiency; Cr  1.73 on 8/10 at 4: 20 in am in [place], 8/11 1.11 and today 1. 69mcrograms/k; losartan and aldactone has been d/c'd.  4. UTI: on cephalexin 5. Microcytic Anemia with MCV 70;  Iron studies with 4% Fe sat;  Check stool guaiacs, start niferex 150 mg bid 6. Peripheral edema, improve on lasix   BP now 144/89 off pressors.  AF rate is better, back on amiodarone. Will increase carvedilol to 6.25 mg bid.  Renal Fxn is stable. Will dc lasix drip, gentle hydration.  Plan R and L heart cath later today since regional wall motion abnormalities cannot be excluded and 13 yrs since last cath. The risks and benefits of a cardiac catheterization including, but not limited to, death, stroke, MI, kidney damage and bleeding were discussed with the patient who indicates understanding and agrees to proceed.   Ultimately plan for restoration of sinus rhythm with cardioversion.   TTroy Sine MD, [facility]8/12/2014, 8:04 AM

## 2014-08-17 NOTE — Progress Notes (Addendum)
Subjective:  No chest pain; breathing better  Objective:   Vital Signs : Filed Vitals:   08/17/14 0400 08/17/14 0500 08/17/14 0600 08/17/14 0700  BP: 123/93 127/81 128/73 135/110  Pulse: 104 99 98 94  Temp: 98.3 F (36.8 C)     TempSrc: Oral     Resp: _0 Height:      Weight:  210 lb 11.2 oz (95.573 kg)    SpO2: 96% 97% 99% 98%    Intake/Output from previous day:  Intake/Output Summary (Last 24 hours) at 08/17/14 0804 Last data filed at 08/17/14 0700  Gross per 24 hour  Intake  611.6 ml  Output   3980 ml  Net -3368.4 ml    I/O since admission: -5748   Wt Readings from Last 3 Encounters:  08/17/14 210 lb 11.2 oz (95.573 kg)  08/15/14 218 lb 7.6 oz (99.1 kg)  08/22/13 209 lb 12 oz (95.142 kg)    Medications: . amiodarone  200 mg Oral BID  . carvedilol  3.125 mg Oral BID WC  . cephALEXin  250 mg Oral TID  . sodium chloride  3 mL Intravenous Q12H    . DOPamine Stopped (08/16/14 1100)  . furosemide (LASIX) infusion 4 mg/hr (08/15/14 2242)  . heparin 1,200 Units/hr (08/16/14 2223)    Physical Exam:   General appearance: alert, cooperative and no distress Neck: no adenopathy, no JVD and supple, symmetrical, trachea midline Lungs: decreased BS no wheezing Heart: irregularly irregular rhythm 1/6 systolic murmur; no rub Abdomen: soft, non-tender; bowel sounds normal; no masses,  no organomegaly Extremities:  Trace pitting edema LE improved from yesterday ,  noredness or tenderness in the calves or thighs Pulses: 2+ and symmetric Skin: Skin color, texture, turgor normal. No rashes or lesions Neurologic: Grossly normal   Rate: 95-105  Rhythm: atrial fibrillation  ECG (independently read by me): AF at 137, NSSTT changes  Lab Results:   Recent Labs  08/15/14 2345 08/16/14 0442 08/16/14 0744 08/17/14 0222  NA 138 138 140 138  K 4.2 4.1 4.0 4.1  CL 107 105 103 102  CO2 _1 GLUCOSE 138* 117* 104* 117*  BUN _2 CREATININE  1.32* 1.17* 1.11* 1.18*  CALCIUM 8.6* 8.6* 9.1 8.4*  MG 2.1  --   --   --     Hepatic Function Latest Ref Rng 08/16/2014 08/15/2014 08/22/2013  Total Protein 6.5 - 8.1 g/dL 6.5 6.3(L) 7.4  Albumin 3.5 - 5.0 g/dL 3.1(L) 3.1(L) -  AST 15 - 41 U/L _3 ALT 14 - 54 U/L _4 Alk Phosphatase 38 - 126 U/L 52 57 69  Total Bilirubin 0.3 - 1.2 mg/dL 0.9 1.1 0.6  Bilirubin, Direct 0.0 - 0.3 mg/dL - - -     Recent Labs  08/15/14 0420 08/15/14 2345 08/17/14 0222  WBC 8.0 8.1 9.0  NEUTROABS  --  4.1  --   HGB 9.9* 10.4* 9.9*  HCT 31.6* 33.0* 31.8*  MCV 70.4* 71.0* 71.5*  PLT 299 295 269      Iron: 21 TIBC: 484 Saturation: 4%   No results for input(s): TROPONINI in the last 72 hours.  Invalid input(s): CK, MB  Lab Results  Component Value Date   TSH 4.920* 08/22/2013   No results for input(s): HGBA1C in the last 72 hours.    Recent Labs  08/15/14 2345  INR 1.44   BNP (last 3 results)  Recent Labs  08/15/14 2345  BNP 641.9*    ProBNP (last 3 results) No results for input(s): PROBNP in the last 8760 hours.   Lipid Panel  No results found for: CHOL, TRIG, HDL, CHOLHDL, VLDL, LDLCALC, LDLDIRECT   Imaging:   08/13/2014 Echo Study Conclusions  - Left ventricle: The cavity size was normal. Systolic function was severely reduced. The estimated ejection fraction was in the range of 25% to 30%. Diffuse hypokinesis. Regional wall motion abnormalities cannot be excluded. - Mitral valve: There was mild regurgitation. - Left atrium: The atrium was mildly dilated. - Right ventricle: The cavity size was mildly dilated. Wall thickness was normal. Pacer wire or catheter noted in right ventricle. Systolic function was normal. - Right atrium: The atrium was mildly dilated. - Tricuspid valve: There was mild-moderate regurgitation. - Pulmonary arteries: Systolic pressure was high normal.  Impressions:  - Rhythm is atrial  fibrillation.   Assessment/Plan:   Active Problems:   Acute on chronic systolic and diastolic heart failure, NYHA class 4  1. Acute on chronic systolic and diastolic dysfunction with  h/o nonischemic cardiomyopathy , cath in 2003. echo in 2014, EF 25 - 30%, echo 08/13/14 25 -30% 2. AF; rate improved to 90 - 100; has been on eliquis, last dose was 8/9 at 21:30;  Amiodarone restarted yesterday at 200 mg bid 3. Hypotension: resolved, had dropped to 53'Z systolic; outpatient meds were held and started on dopamine; now off pressors 3. Renal insufficiency; Cr  1.73 on 8/10 at 4: 20 in am in [place], 8/11 1.11 and today 1. 69mcrograms/k; losartan and aldactone has been d/c'd.  4. UTI: on cephalexin 5. Microcytic Anemia with MCV 70;  Iron studies with 4% Fe sat;  Check stool guaiacs, start niferex 150 mg bid 6. Peripheral edema, improve on lasix   BP now 144/89 off pressors.  AF rate is better, back on amiodarone. Will increase carvedilol to 6.25 mg bid.  Renal Fxn is stable. Will dc lasix drip, gentle hydration.  Plan R and L heart cath later today since regional wall motion abnormalities cannot be excluded and 13 yrs since last cath. The risks and benefits of a cardiac catheterization including, but not limited to, death, stroke, MI, kidney damage and bleeding were discussed with the patient who indicates understanding and agrees to proceed.   Ultimately plan for restoration of sinus rhythm with cardioversion.   TTroy Sine MD, [facility]8/12/2014, 8:04 AM

## 2014-08-18 DIAGNOSIS — I5043 Acute on chronic combined systolic (congestive) and diastolic (congestive) heart failure: Principal | ICD-10-CM

## 2014-08-18 DIAGNOSIS — I480 Paroxysmal atrial fibrillation: Secondary | ICD-10-CM

## 2014-08-18 DIAGNOSIS — I2510 Atherosclerotic heart disease of native coronary artery without angina pectoris: Secondary | ICD-10-CM

## 2014-08-18 LAB — HEPARIN LEVEL (UNFRACTIONATED): Heparin Unfractionated: 0.54 IU/mL (ref 0.30–0.70)

## 2014-08-18 LAB — CBC
HEMATOCRIT: 30.6 % — AB (ref 36.0–46.0)
Hemoglobin: 9.5 g/dL — ABNORMAL LOW (ref 12.0–15.0)
MCH: 21.7 pg — ABNORMAL LOW (ref 26.0–34.0)
MCHC: 31 g/dL (ref 30.0–36.0)
MCV: 69.9 fL — AB (ref 78.0–100.0)
PLATELETS: 296 10*3/uL (ref 150–400)
RBC: 4.38 MIL/uL (ref 3.87–5.11)
RDW: 17.2 % — AB (ref 11.5–15.5)
WBC: 7.8 10*3/uL (ref 4.0–10.5)

## 2014-08-18 LAB — BASIC METABOLIC PANEL
Anion gap: 10 (ref 5–15)
BUN: 16 mg/dL (ref 6–20)
CO2: 25 mmol/L (ref 22–32)
Calcium: 8.4 mg/dL — ABNORMAL LOW (ref 8.9–10.3)
Chloride: 103 mmol/L (ref 101–111)
Creatinine, Ser: 1.04 mg/dL — ABNORMAL HIGH (ref 0.44–1.00)
GFR calc Af Amer: >60 mL/min (ref >60)
GFR calc non Af Amer: 54 mL/min — ABNORMAL LOW (ref >60)
Glucose, Bld: 100 mg/dL — ABNORMAL HIGH (ref 65–99)
Potassium: 4.1 mmol/L (ref 3.5–5.1)
Sodium: 138 mmol/L (ref 135–145)

## 2014-08-18 LAB — APTT: APTT: 87 s — AB (ref 24–37)

## 2014-08-18 MED ORDER — SODIUM CHLORIDE 0.9 % IV SOLN
510.0000 mg | Freq: Once | INTRAVENOUS | Status: AC
  Administered 2014-08-18: 510 mg via INTRAVENOUS
  Filled 2014-08-18: qty 17

## 2014-08-18 MED ORDER — SPIRONOLACTONE 12.5 MG HALF TABLET
12.5000 mg | ORAL_TABLET | Freq: Every day | ORAL | Status: DC
  Administered 2014-08-18 – 2014-08-22 (×5): 12.5 mg via ORAL
  Filled 2014-08-18 (×5): qty 1

## 2014-08-18 MED ORDER — FUROSEMIDE 40 MG PO TABS
40.0000 mg | ORAL_TABLET | Freq: Two times a day (BID) | ORAL | Status: DC
  Administered 2014-08-18 – 2014-08-21 (×6): 40 mg via ORAL
  Filled 2014-08-18 (×9): qty 1

## 2014-08-18 MED ORDER — POTASSIUM CHLORIDE ER 10 MEQ PO TBCR
20.0000 meq | EXTENDED_RELEASE_TABLET | Freq: Every day | ORAL | Status: DC
Start: 2014-08-18 — End: 2014-08-22
  Administered 2014-08-18 – 2014-08-22 (×5): 20 meq via ORAL
  Filled 2014-08-18 (×5): qty 2

## 2014-08-18 MED ORDER — APIXABAN 5 MG PO TABS
5.0000 mg | ORAL_TABLET | Freq: Two times a day (BID) | ORAL | Status: DC
  Administered 2014-08-18 – 2014-08-22 (×8): 5 mg via ORAL
  Filled 2014-08-18 (×13): qty 1

## 2014-08-18 NOTE — Progress Notes (Addendum)
Subjective:   Feels good. Breathing better. No CP. Walked unit this am.  Had cath 8/12. With decision to treat CAD medically    Prox RCA lesion, 30% stenosed.  Post Atrio lesion, 25% stenosed.  Mid Cx lesion, 100% stenosed. (CTO)  1st Diag lesion, 40% stenosed.  Mid LAD lesion, 70% stenosed (FFR 0.82)  Objective:   Vital Signs : Filed Vitals:   08/18/14 0600 08/18/14 0700 08/18/14 0836 08/18/14 1303  BP: 120/87 135/92    Pulse: 100 85    Temp:   98.5 F (36.9 C) 97.3 F (36.3 C)  TempSrc:   Oral Oral  Resp: 23 17    Height:      Weight:      SpO2: 96% 97%      Intake/Output from previous day:  Intake/Output Summary (Last 24 hours) at 08/18/14 1314 Last data filed at 08/18/14 0700  Gross per 24 hour  Intake 594.63 ml  Output    650 ml  Net -55.37 ml    I/O since admission: -5748   Wt Readings from Last 3 Encounters:  08/18/14 95.3 kg (210 lb 1.6 oz)  08/15/14 99.1 kg (218 lb 7.6 oz)  08/22/13 95.142 kg (209 lb 12 oz)    Medications: . amiodarone  200 mg Oral BID  . carvedilol  6.25 mg Oral BID WC  . cephALEXin  250 mg Oral TID  . iron polysaccharides  150 mg Oral BID  . sodium chloride  3 mL Intravenous Q12H  . sodium chloride  3 mL Intravenous Q12H    . heparin 1,200 Units/hr (08/17/14 1836)    Physical Exam:   General appearance: sitting in chair alert, cooperative and no distress Neck: no adenopathy, unable to assess JVP  and supple, symmetrical, trachea midline Lungs: decreased BS no wheezing Heart: irregularly irregular rhythm 1/6 systolic murmur; no rub Abdomen: soft, non-tender; bowel sounds normal; no masses,  no organomegaly Extremities:2+ pitting edema LE  ,  Pulses: 2+ and symmetric Skin: Skin color, texture, turgor normal. No rashes or lesions Neurologic: Grossly normal   Rate: 90s  Rhythm: atrial fibrillation   Lab Results:   Recent Labs  08/15/14 2345  08/16/14 0744 08/17/14 0222 08/18/14 0220  NA 138  < > 140  138 138  K 4.2  < > 4.0 4.1 4.1  CL 107  < > 103 102 103  CO2 22  < > '27 25 25  ' GLUCOSE 138*  < > 104* 117* 100*  BUN 20  < > '17 19 16  ' CREATININE 1.32*  < > 1.11* 1.18* 1.04*  CALCIUM 8.6*  < > 9.1 8.4* 8.4*  MG 2.1  --   --   --   --   < > = values in this interval not displayed.  Hepatic Function Latest Ref Rng 08/16/2014 08/15/2014 08/22/2013  Total Protein 6.5 - 8.1 g/dL 6.5 6.3(L) 7.4  Albumin 3.5 - 5.0 g/dL 3.1(L) 3.1(L) -  AST 15 - 41 U/L '27 29 27  ' ALT 14 - 54 U/L '24 26 30  ' Alk Phosphatase 38 - 126 U/L 52 57 69  Total Bilirubin 0.3 - 1.2 mg/dL 0.9 1.1 0.6  Bilirubin, Direct 0.0 - 0.3 mg/dL - - -     Recent Labs  08/15/14 2345 08/17/14 0222 08/18/14 0220  WBC 8.1 9.0 7.8  NEUTROABS 4.1  --   --   HGB 10.4* 9.9* 9.5*  HCT 33.0* 31.8* 30.6*  MCV 71.0* 71.5* 69.9*  PLT  295 269 296      Iron: 21 TIBC: 484 Saturation: 4%   No results for input(s): TROPONINI in the last 72 hours.  Invalid input(s): CK, MB  Lab Results  Component Value Date   TSH 4.920* 08/22/2013   No results for input(s): HGBA1C in the last 72 hours.    Recent Labs  08/15/14 2345  INR 1.44   BNP (last 3 results)  Recent Labs  08/15/14 2345  BNP 641.9*    ProBNP (last 3 results) No results for input(s): PROBNP in the last 8760 hours.   Lipid Panel  No results found for: CHOL, TRIG, HDL, CHOLHDL, VLDL, LDLCALC, LDLDIRECT   Imaging:   08/13/2014 Echo Study Conclusions  - Left ventricle: The cavity size was normal. Systolic function was severely reduced. The estimated ejection fraction was in the range of 25% to 30%. Diffuse hypokinesis. Regional wall motion abnormalities cannot be excluded. - Mitral valve: There was mild regurgitation. - Left atrium: The atrium was mildly dilated. - Right ventricle: The cavity size was mildly dilated. Wall thickness was normal. Pacer wire or catheter noted in right ventricle. Systolic function was normal. - Right atrium: The  atrium was mildly dilated. - Tricuspid valve: There was mild-moderate regurgitation. - Pulmonary arteries: Systolic pressure was high normal.  Impressions:  - Rhythm is atrial fibrillation.   Assessment/Plan:   Active Problems:   Acute on chronic systolic and diastolic heart failure, NYHA class 4   Coronary artery disease involving native coronary artery of native heart without angina pectoris  1. Acute on chronic systolic and diastolic dysfunction with  h/o nonischemic cardiomyopathy  echo 08/13/14 25 -30% 2. AF; rate improved to 90 - 100; has been on eliquis, last dose was 8/9 at 21:30;  On amio 200 bid 3. Hypotension: resolved, had dropped to 46'O systolic; outpatient meds were held and started on dopamine; now off pressors 4. Acute renal failure: resolved 5. 2-V CAD with CTO LCX. Treat medically 6. UTI: on cephalexin 7. Microcytic Anemia with MCV 70;  Iron studies with 4% Fe sat;  Check stool guaiacs, start niferex 150 mg bid  Doing very well. Results of cath reviewed with her. Remains volume overloaded. Will start lasix and spiro. Continue carvedilol. Hopefully can get ACE soon.  Will plan TEE and DC-CV on Monday. Continue amio. Switch heparin back to Eliquis. Can go to tele. Will give feraheme for anemia. Consider outpatient GI w/u for microcytic anemia.   Erin Penninger,MD 1:19 PM

## 2014-08-18 NOTE — Progress Notes (Signed)
CARDIAC REHAB PHASE I   PRE:  Rate/Rhythm: 90 Afib  BP:  Supine:   Sitting: 131/75  Standing:    SaO2: 99% 3L O2  MODE:  Ambulation: 150 ft   POST:  Rate/Rhythm: 178 Afib back to 97 Afib in room  BP:  Supine:   Sitting: 140/76  Standing:    SaO2: 96% 3L O2  1015-1115 Pt tolerated ambulation fair with assist x1 and pushing rolling walker, gait slow, steady, multiple rest breaks taken, no c/o. HR in the 170s with ambulation back to 90s after ambulation. To chair after walk with legs elevated, IV intact, back on 3L O2 in room, call bell in reach. CHF booklet given an reviewed with patient including, daily weights, low sodium diet, HF zones, and when to call physician or 911. Pt verbalizes understanding of instructions given.  Annetta Maw

## 2014-08-18 NOTE — Progress Notes (Signed)
ANTICOAGULATION CONSULT NOTE - Follow-up Consult  Pharmacy Consult for Heparin Indication: atrial fibrillation  No Known Allergies  Patient Measurements: Height: 5' (152.4 cm) Weight: 210 lb 11.2 oz (95.573 kg) IBW/kg (Calculated) : 45.5  Heparin dosing wt: 70 kg  Vital Signs: Temp: 98.1 F (36.7 C) (08/13 0349) Temp Source: Oral (08/13 0349) BP: 111/74 mmHg (08/13 0100) Pulse Rate: 92 (08/13 0100)  Labs:  Recent Labs  08/15/14 0420  08/15/14 2345 08/16/14 0442 08/16/14 0744 08/17/14 0222 08/18/14 0220  HGB 9.9*  --  10.4*  --   --  9.9*  --   HCT 31.6*  --  33.0*  --   --  31.8*  --   PLT 299  --  295  --   --  269  --   APTT  --   < > 104*  --  75* 88* 87*  LABPROT  --   --  17.6*  --   --   --   --   INR  --   --  1.44  --   --   --   --   HEPARINUNFRC  --   --   --   --  1.07* 0.67 0.54  CREATININE 1.73*  --  1.32* 1.17* 1.11* 1.18*  --   < > = values in this interval not displayed.  Estimated Creatinine Clearance: 46.5 mL/min (by C-G formula based on Cr of 1.18).  Assessment: 69 yo female from [place] with afib. She was on coumadin previously and has been switched to apixiban (Pt last received Eliquis at 2200 on 8/9).  Pt continues on heparin for afib s/p cath. (Eliquis held for cath). APTT is at goal (87) on 1200 units/hr. Heparin level 0.54 so now correlating with aPTT. CBC stable. No bleeding noted.  Goal of Therapy:  Heparin level 0.3-0.7 units/ml Monitor platelets by anticoagulation protocol: Yes   Plan:  -Continue heparin 1200 units/hr -D/c aPTT and use heparin levels only for monitoring -F/u restart of Eliquis  Christoper Fabian, PharmD, BCPS Clinical pharmacist, pager [phone]  08/18/2014 4:14 AM

## 2014-08-19 NOTE — Progress Notes (Signed)
Subjective:  No increased dyspnea.  Had cath 8/12. With decision to treat CAD medically    Prox RCA lesion, 30% stenosed.  Post Atrio lesion, 25% stenosed.  Mid Cx lesion, 100% stenosed. (CTO)  1st Diag lesion, 40% stenosed.  Mid LAD lesion, 70% stenosed (FFR 0.82)  Objective:   Vital Signs : Filed Vitals:   08/18/14 1900 08/18/14 2237 08/19/14 0110 08/19/14 0707  BP: 106/73 114/63 138/81 121/74  Pulse: 86 85 85 88  Temp: 98.1 F (36.7 C) 98.2 F (36.8 C) 98.2 F (36.8 C) 98.3 F (36.8 C)  TempSrc: Oral Oral Oral Oral  Resp: '22 22 20 20  ' Height: 5' (1.524 m)     Weight: 213 lb 6.4 oz (96.798 kg)   211 lb 8 oz (95.936 kg)  SpO2: 100% 99% 100% 100%    Intake/Output from previous day:  Intake/Output Summary (Last 24 hours) at 08/19/14 0918 Last data filed at 08/19/14 0850  Gross per 24 hour  Intake   1869 ml  Output    725 ml  Net   1144 ml    I/O since admission: -5748   Wt Readings from Last 3 Encounters:  08/19/14 211 lb 8 oz (95.936 kg)  08/15/14 218 lb 7.6 oz (99.1 kg)  08/22/13 209 lb 12 oz (95.142 kg)    Medications: . amiodarone  200 mg Oral BID  . apixaban  5 mg Oral BID  . carvedilol  6.25 mg Oral BID WC  . cephALEXin  250 mg Oral TID  . furosemide  40 mg Oral BID  . iron polysaccharides  150 mg Oral BID  . potassium chloride  20 mEq Oral Daily  . sodium chloride  3 mL Intravenous Q12H  . sodium chloride  3 mL Intravenous Q12H  . spironolactone  12.5 mg Oral Daily       Physical Exam:   General appearance: sitting in chair alert, cooperative and no distress Neck: no adenopathy, unable to assess JVP  and supple, symmetrical, trachea midline Lungs: decreased BS no wheezing Heart: irregularly irregular rhythm 1/6 systolic murmur; no rub Abdomen: soft, non-tender; bowel sounds normal; no masses,  no organomegaly Extremities:2+ pitting edema LE  ,  Pulses: 2+ and symmetric Skin: Skin color, texture, turgor normal. No rashes or  lesions Neurologic: Grossly normal   Rate: 90s  Rhythm: atrial fibrillation   Lab Results:   Recent Labs  08/17/14 0222 08/18/14 0220  NA 138 138  K 4.1 4.1  CL 102 103  CO2 25 25  GLUCOSE 117* 100*  BUN 19 16  CREATININE 1.18* 1.04*  CALCIUM 8.4* 8.4*    Hepatic Function Latest Ref Rng 08/16/2014 08/15/2014 08/22/2013  Total Protein 6.5 - 8.1 g/dL 6.5 6.3(L) 7.4  Albumin 3.5 - 5.0 g/dL 3.1(L) 3.1(L) -  AST 15 - 41 U/L '27 29 27  ' ALT 14 - 54 U/L '24 26 30  ' Alk Phosphatase 38 - 126 U/L 52 57 69  Total Bilirubin 0.3 - 1.2 mg/dL 0.9 1.1 0.6  Bilirubin, Direct 0.0 - 0.3 mg/dL - - -     Recent Labs  08/17/14 0222 08/18/14 0220  WBC 9.0 7.8  HGB 9.9* 9.5*  HCT 31.8* 30.6*  MCV 71.5* 69.9*  PLT 269 296      Iron: 21 TIBC: 484 Saturation: 4%   No results for input(s): TROPONINI in the last 72 hours.  Invalid input(s): CK, MB  Lab Results  Component Value Date   TSH 4.920* 08/22/2013  No results for input(s): HGBA1C in the last 72 hours.   No results for input(s): INR in the last 72 hours. BNP (last 3 results)  Recent Labs  08/15/14 2345  BNP 641.9*    ProBNP (last 3 results) No results for input(s): PROBNP in the last 8760 hours.   Lipid Panel  No results found for: CHOL, TRIG, HDL, CHOLHDL, VLDL, LDLCALC, LDLDIRECT   08/13/2014 Echo Study Conclusions  - Left ventricle: The cavity size was normal. Systolic function was severely reduced. The estimated ejection fraction was in the range of 25% to 30%. Diffuse hypokinesis. Regional wall motion abnormalities cannot be excluded. - Mitral valve: There was mild regurgitation. - Left atrium: The atrium was mildly dilated. - Right ventricle: The cavity size was mildly dilated. Wall thickness was normal. Pacer wire or catheter noted in right ventricle. Systolic function was normal. - Right atrium: The atrium was mildly dilated. - Tricuspid valve: There was mild-moderate regurgitation. -  Pulmonary arteries: Systolic pressure was high normal.  Impressions:  - Rhythm is atrial fibrillation.   Assessment/Plan:   Active Problems:   Acute on chronic systolic and diastolic heart failure, NYHA class 4   Coronary artery disease involving native coronary artery of native heart without angina pectoris  1. Acute on chronic systolic and diastolic dysfunction with  h/o nonischemic cardiomyopathy  echo 08/13/14 25 -30% 2. AF; rate improved to 90 - 100; has been on eliquis, last dose was 8/9 at 21:30;  On amio 200 bid 3. Hypotension: resolved, had dropped to 32'Z systolic; outpatient meds were held and started on dopamine; now off pressors 4. Acute renal failure: resolved 5. 2-V CAD with CTO LCX. Treat medically 6. UTI: on cephalexin 7. Microcytic Anemia with MCV 70;  Iron studies with 4% Fe sat;  Check stool guaiacs, start niferex 150 mg bid 8. Obesity, BMI 41 with OSA- on C-pap.    Plan - She has diuresed 4.7L, and lost 10 lbs.  TEE and DC-CV on Monday. Continue amio. On Eliquis.  Consider outpatient GI w/u for microcytic anemia.    The risks and benefits of transesophageal echocardiogram have been explained including risks of esophageal damage, perforation (1:10,000 risk), bleeding, pharyngeal hematoma as well as other potential complications associated with conscious sedation including aspiration, arrhythmia, respiratory failure and death. Alternatives to treatment were discussed, questions were answered. Patient is willing to proceed.   Erlene Quan, PA 08/19/2014 9:22 AM   Personally seen and examined. Agree with above. Paroxysmal atrial fibrillation-amiodarone-planned TEE cardioversion Monday CAD noted as above-medical management Continue to work on morbid obesity/weight loss. On anticoagulation Eliquis. May need further GI workup, microcytic anemia noted.  Candee Furbish, MD

## 2014-08-20 DIAGNOSIS — I481 Persistent atrial fibrillation: Secondary | ICD-10-CM

## 2014-08-20 LAB — TSH: TSH: 0.018 u[IU]/mL — AB (ref 0.350–4.500)

## 2014-08-20 LAB — T4, FREE: Free T4: 2.58 ng/dL — ABNORMAL HIGH (ref 0.61–1.12)

## 2014-08-20 MED FILL — Nitroglycerin IV Soln 100 MCG/ML in D5W: INTRA_ARTERIAL | Qty: 10 | Status: AC

## 2014-08-20 MED FILL — Heparin Sodium (Porcine) 2 Unit/ML in Sodium Chloride 0.9%: INTRAMUSCULAR | Qty: 500 | Status: AC

## 2014-08-20 NOTE — Care Management Important Message (Signed)
Important Message  Patient Details  Name: Erin [name] MRN: [id number] Date of Birth: 1945/06/08   Medicare Important Message Given:  Yes-second notification given    Yvonna Alanis 08/20/2014, 3:00 PM

## 2014-08-20 NOTE — Progress Notes (Signed)
Patient has home CPAP in room. RT set up CPAP with water and placed beside patient. Patient will place herself on when ready.

## 2014-08-20 NOTE — Progress Notes (Addendum)
CARDIAC REHAB PHASE I   PRE:  Rate/Rhythm: 100 a. fib  BP:  Sitting: 125/66        SaO2: 95 RA  MODE:  Ambulation: 140 ft   POST:  Rate/Rhythm: 118 a. fib  BP:  Sitting: 127/62         SaO2: 93 RA  Pt ambulated 140 ft on RA, rolling walker, assist x1, slow, steady gait, tolerated fair. Pt c/o DOE, fatigue, denies CP, dizziness, standing rest x2. Pt HR 110s-162 a.fib during ambulation, sats 89-93% on RA, pt sats improved with deep breathing during ambulation, needed frequent reminder to take deep breaths. Completed CHF education with pt daughter at bedside. Reviewed modified exercise guidelines, heart healthy diet, sodium and fluid restrictions, daily weights, HF zone tool, and phase 2 cardiac rehab. Pt and daughter verbalized understanding, had many questions regarding diet. Pt agrees to phase 2 cardiac rehab. Will send referral to [facility] per pt request. Pt to bed after walk, call bell within reach.   [phone]  [name], RN, BSN 08/20/2014 2:55 PM

## 2014-08-20 NOTE — Progress Notes (Signed)
Called endoscopy and was informed that pt's TEE is scheduled for Tomorrow Tuesday 08/21/14 instead of today. I was informed that there is no opening for her to have the procedure today.

## 2014-08-20 NOTE — Progress Notes (Signed)
Patient: Erin [name] / Admit Date: 08/15/2014 / Date of Encounter: 08/20/2014, 11:27 AM   Subjective: Feels "ok" - doesn't elaborate much further. Says breathing is stable. No CP.   Objective: Telemetry: atrial fib rates 80s-90s, occasional spikes to 140s-150s. Physical Exam: Blood pressure 107/70, pulse 94, temperature 98.5 F (36.9 C), temperature source Oral, resp. rate 18, height 5' (1.524 m), weight 211 lb 8.5 oz (95.95 kg), SpO2 100 %. General: Well developed obese AAF in no acute distress. Head: Normocephalic, atraumatic, sclera non-icteric, no xanthomas, nares are without discharge. Neck: Negative for carotid bruits. JVP not elevated. Lungs: Clear bilaterally to auscultation without wheezes, rales, or rhonchi. Breathing is unlabored. Heart: Irregularly irregular, rate controlled, S1 S2 without murmurs, rubs, or gallops.  Abdomen: Soft, non-tender, non-distended with normoactive bowel sounds. No rebound/guarding. Extremities: No clubbing or cyanosis. 1+ bilateral pitting LE edema. Distal pedal pulses are 2+ and equal bilaterally. Neuro: Alert and oriented X 3. Moves all extremities spontaneously. Psych:  Responds to questions appropriately with a normal affect.   Intake/Output Summary (Last 24 hours) at 08/20/14 1127 Last data filed at 08/20/14 0906  Gross per 24 hour  Intake 843.33 ml  Output   1000 ml  Net -156.67 ml    Inpatient Medications:  . amiodarone  200 mg Oral BID  . apixaban  5 mg Oral BID  . carvedilol  6.25 mg Oral BID WC  . cephALEXin  250 mg Oral TID  . furosemide  40 mg Oral BID  . iron polysaccharides  150 mg Oral BID  . potassium chloride  20 mEq Oral Daily  . sodium chloride  3 mL Intravenous Q12H  . sodium chloride  3 mL Intravenous Q12H  . spironolactone  12.5 mg Oral Daily   Infusions:    Labs:  Recent Labs  08/18/14 0220  NA 138  K 4.1  CL 103  CO2 25  GLUCOSE 100*  BUN 16  CREATININE 1.04*  CALCIUM 8.4*   \   Recent  Labs  08/18/14 0220  WBC 7.8  HGB 9.5*  HCT 30.6*  MCV 69.9*  PLT 296     Radiology/Studies:  Dg Chest Portable 1 View  08/13/2014   CLINICAL DATA:  69 year old female with retention of fluid. Shortness of breath at times. Initial encounter.  EXAM: PORTABLE CHEST - 1 VIEW  COMPARISON:  None.  FINDINGS: Exam limited by portable technique and habitus.  Cardiomegaly with sequential pacemaker/ AICD in place, grossly appearing in similar position to prior exam.  Central pulmonary vascular prominence.  No obvious segmental consolidation or gross pneumothorax.  Patient would benefit from two view chest when able.  IMPRESSION: Exam limited by portable technique and habitus.  Cardiomegaly with sequential pacemaker/ AICD in place.  Central pulmonary vascular prominence.  No obvious segmental consolidation.   Electronically Signed   By: Lacy Duverney M.D.   On: 08/13/2014 15:08     Assessment and Plan  69F with h/o NICM 2003 s/p ACID 2014, chronic systolic CHF, PAF, med noncompliance, HTN, HLD, obesity, and OSA on CPAP who presented to [facility] with AF RVR and worsening SOB in setting of not taking meds for 5 days and salting food. Hospital course complicated by AKI, hypotension requiring pressors, possible UTI.  1. Acute on chronic combined CHF - 2D echo 08/13/14 with EF 25-30%, diffuse HK, mild MR, mildly dilated RA, mild-mod TR - volume stable on current regimen - still with some edema but with recent hypotension and AKI, will  continue current regimen. Suspect restoration of NSR should improve volume status.  - continue Lasix, spironolactone - ACE/ARB on hold due to recent AKI  2. Paroxysmal atrial fibrillation - h/o this, last seen in 08/2013 at which time she was in NSR - plan TEE/DCCV tomorrow - unable to be accomodated on schedule today - the patient does mention that occasionally pills get stuck when she takes them, but go down quickly with eating crackers or apple sauce afterwards - will discuss  with Dr. Katrinka Blazing whether affects plan for TEE/DCCV - care management to assess cost of apixaban - was on Coumadin PTA but INR 1.24 in setting of med noncompliance. Importance of compliance stressed with patient who agrees she is willing to continue anticoagulation uninterrupted from now on - apixaban started 8/8-8/9, then heparin per pharmacy then back to apixaban 8/13 - started on amiodarone this admission - will order baseline thyroid function  3. Hypotension, improved 4. Acute kidney injury, improved  5. CAD by cath 08/17/14  - CTO LCx, 70% mLAD (FFR 0.82), otherwise mild nonobstructive disease, for med rx - no ASA given apixaban - check lipids in AM and tailor statin based on LDL level  6. Klebsiella UTI - on cephalexin since 08/15/14 - will likely complete a 10-14 day course given complicated admission.  7. Microcytic Anemia -  with MCV 70; Iron studies with 4% Fe sat - anemia is new since 2014 but MCV was low at that time - iron started this admission -check stool guaiacs - denies bleeding - as Hgb has remained stable will need outpatient w/u - has no PCP. Will ask CM to help arrange.  8. Obesity Body mass index is 41.31 kg/(m^2). c/b OSA  Signed, Ronie Spies PA-C Pager: [phone]

## 2014-08-20 NOTE — Progress Notes (Signed)
Called and spoke with [facility] in [place] informing her that pt has C Pap from home that need to be checked be using. Instructed she will notify some one.  Pt also made aware.  [name], Charity fundraiser.

## 2014-08-20 NOTE — Progress Notes (Signed)
BENEFIT CHECK FOR ELIQUIS    RE: Benefit check  Received: Today    Nia A Shealy CCMA            Pt co pay for apixaban (ELIQUIS) tablet 5 mg  will be $125 for brand- $90 for generic - prior auth not required

## 2014-08-20 NOTE — Progress Notes (Signed)
At 1500 introduced self to pt as in coming nurse 3-7p.  Call bell at reach. Instructed to call for assistance. Verbalized understanding. Will continue to monitor.  [name], Charity fundraiser.

## 2014-08-21 ENCOUNTER — Ambulatory Visit (HOSPITAL_COMMUNITY): Payer: Medicare Other | Attending: Physician Assistant

## 2014-08-21 ENCOUNTER — Encounter (HOSPITAL_COMMUNITY)
Admission: AD | Disposition: A | Discharge status: Home / Self Care | Payer: Self-pay | Source: Other Acute Inpatient Hospital | Attending: Internal Medicine

## 2014-08-21 ENCOUNTER — Encounter (HOSPITAL_COMMUNITY): Payer: Self-pay | Admitting: *Deleted

## 2014-08-21 ENCOUNTER — Inpatient Hospital Stay (HOSPITAL_COMMUNITY): Payer: Medicare Other | Admitting: Certified Registered Nurse Anesthetist

## 2014-08-21 ENCOUNTER — Other Ambulatory Visit (HOSPITAL_COMMUNITY): Payer: PRIVATE HEALTH INSURANCE

## 2014-08-21 DIAGNOSIS — I4891 Unspecified atrial fibrillation: Secondary | ICD-10-CM | POA: Insufficient documentation

## 2014-08-21 DIAGNOSIS — I081 Rheumatic disorders of both mitral and tricuspid valves: Secondary | ICD-10-CM | POA: Insufficient documentation

## 2014-08-21 DIAGNOSIS — I340 Nonrheumatic mitral (valve) insufficiency: Secondary | ICD-10-CM

## 2014-08-21 DIAGNOSIS — Z7901 Long term (current) use of anticoagulants: Secondary | ICD-10-CM

## 2014-08-21 HISTORY — PX: TEE WITHOUT CARDIOVERSION: SHX5443

## 2014-08-21 LAB — CBC
HEMATOCRIT: 30.1 % — AB (ref 36.0–46.0)
HEMOGLOBIN: 9.4 g/dL — AB (ref 12.0–15.0)
MCH: 22 pg — AB (ref 26.0–34.0)
MCHC: 31.2 g/dL (ref 30.0–36.0)
MCV: 70.5 fL — AB (ref 78.0–100.0)
Platelets: 280 10*3/uL (ref 150–400)
RBC: 4.27 MIL/uL (ref 3.87–5.11)
RDW: 17.4 % — AB (ref 11.5–15.5)
WBC: 9.2 10*3/uL (ref 4.0–10.5)

## 2014-08-21 LAB — BASIC METABOLIC PANEL
ANION GAP: 9 (ref 5–15)
BUN: 11 mg/dL (ref 6–20)
CO2: 30 mmol/L (ref 22–32)
Calcium: 8.7 mg/dL — ABNORMAL LOW (ref 8.9–10.3)
Chloride: 98 mmol/L — ABNORMAL LOW (ref 101–111)
Creatinine, Ser: 0.97 mg/dL (ref 0.44–1.00)
GFR calc Af Amer: >60 mL/min (ref >60)
GFR, EST NON AFRICAN AMERICAN: 58 mL/min — AB (ref >60)
GLUCOSE: 95 mg/dL (ref 65–99)
POTASSIUM: 4.2 mmol/L (ref 3.5–5.1)
Sodium: 137 mmol/L (ref 135–145)

## 2014-08-21 LAB — GLUCOSE, CAPILLARY: GLUCOSE-CAPILLARY: 142 mg/dL — AB (ref 65–99)

## 2014-08-21 LAB — LIPID PANEL
Cholesterol: 130 mg/dL (ref 0–200)
HDL: 25 mg/dL — ABNORMAL LOW (ref >40)
LDL CALC: 91 mg/dL (ref 0–99)
Total CHOL/HDL Ratio: 5.2 RATIO
Triglycerides: 72 mg/dL (ref <150)
VLDL: 14 mg/dL (ref 0–40)

## 2014-08-21 SURGERY — ECHOCARDIOGRAM, TRANSESOPHAGEAL
Anesthesia: Monitor Anesthesia Care

## 2014-08-21 MED ORDER — SODIUM CHLORIDE 0.9 % IV SOLN: 250.0000 mL | INTRAVENOUS | Status: DC

## 2014-08-21 MED ORDER — LIDOCAINE HCL (CARDIAC) 20 MG/ML IV SOLN
INTRAVENOUS | Status: DC | PRN
  Administered 2014-08-21: 50 mg via INTRAVENOUS

## 2014-08-21 MED ORDER — SUCCINYLCHOLINE CHLORIDE 20 MG/ML IJ SOLN
INTRAMUSCULAR | Status: DC | PRN
  Administered 2014-08-21: 100 mg via INTRAVENOUS

## 2014-08-21 MED ORDER — SODIUM CHLORIDE 0.9 % IJ SOLN
3.0000 mL | Freq: Two times a day (BID) | INTRAMUSCULAR | Status: DC
  Administered 2014-08-21 – 2014-08-22 (×2): 3 mL via INTRAVENOUS

## 2014-08-21 MED ORDER — FUROSEMIDE 80 MG PO TABS
80.0000 mg | ORAL_TABLET | Freq: Two times a day (BID) | ORAL | Status: DC
  Administered 2014-08-21 – 2014-08-22 (×2): 80 mg via ORAL
  Filled 2014-08-21 (×4): qty 1

## 2014-08-21 MED ORDER — LACTATED RINGERS IV SOLN
INTRAVENOUS | Status: DC | PRN
  Administered 2014-08-21: 10:00:00 via INTRAVENOUS

## 2014-08-21 MED ORDER — LIDOCAINE VISCOUS 2 % MT SOLN
OROMUCOSAL | Status: AC
  Filled 2014-08-21: qty 15

## 2014-08-21 MED ORDER — ETOMIDATE 2 MG/ML IV SOLN
INTRAVENOUS | Status: DC | PRN
  Administered 2014-08-21: 10 mg via INTRAVENOUS

## 2014-08-21 MED ORDER — SODIUM CHLORIDE 0.9 % IJ SOLN: 3.0000 mL | INTRAMUSCULAR | Status: DC | PRN

## 2014-08-21 MED ORDER — SODIUM CHLORIDE 0.9 % IV SOLN: INTRAVENOUS | Status: DC

## 2014-08-21 NOTE — Anesthesia Preprocedure Evaluation (Signed)
Anesthesia Evaluation  Patient identified by MRN, date of birth, ID band Patient awake    Reviewed: Allergy & Precautions, NPO status , Patient's Chart, lab work & pertinent test results  Airway Mallampati: II  TM Distance: >3 FB Neck ROM: Full    Dental  (+) Teeth Intact, Dental Advisory Given   Pulmonary  breath sounds clear to auscultation        Cardiovascular hypertension, Rhythm:Regular Rate:Normal     Neuro/Psych    GI/Hepatic   Endo/Other    Renal/GU      Musculoskeletal   Abdominal   Peds  Hematology   Anesthesia Other Findings   Reproductive/Obstetrics                             Anesthesia Physical Anesthesia Plan  ASA: II  Anesthesia Plan: General   Post-op Pain Management:    Induction: Intravenous  Airway Management Planned: Mask  Additional Equipment:   Intra-op Plan:   Post-operative Plan:   Informed Consent: I have reviewed the patients History and Physical, chart, labs and discussed the procedure including the risks, benefits and alternatives for the proposed anesthesia with the patient or authorized representative who has indicated his/her understanding and acceptance.     Plan Discussed with: CRNA and Anesthesiologist  Anesthesia Plan Comments:         Anesthesia Quick Evaluation

## 2014-08-21 NOTE — Anesthesia Postprocedure Evaluation (Signed)
  Anesthesia Post-op Note  Patient: Erin [name]  Procedure(s) Performed: Procedure(s): TRANSESOPHAGEAL ECHOCARDIOGRAM (TEE) (N/A)  Patient Location: PACU  Anesthesia Type:General  Level of Consciousness: awake, alert  and oriented  Airway and Oxygen Therapy: Patient Spontanous Breathing and Patient connected to nasal cannula oxygen  Post-op Pain: mild  Post-op Assessment: Post-op Vital signs reviewed, Patient's Cardiovascular Status Stable, Respiratory Function Stable, Patent Airway and Pain level controlled              Post-op Vital Signs: stable  Last Vitals:  Filed Vitals:   08/21/14 1452  BP: 120/64  Pulse: 90  Temp:   Resp: 18    Complications: No apparent anesthesia complications

## 2014-08-21 NOTE — CV Procedure (Signed)
LVEF 20%.  Global hypokinesis. Moderate MR. LA appendage thrombus and sluggish flow.  See full report for full details.

## 2014-08-21 NOTE — Transfer of Care (Signed)
Immediate Anesthesia Transfer of Care Note  Patient: Erin [name]  Procedure(s) Performed: Procedure(s): TRANSESOPHAGEAL ECHOCARDIOGRAM (TEE) (N/A)  Patient Location: PACU  Anesthesia Type:General  Level of Consciousness: awake, alert  and oriented  Airway & Oxygen Therapy: Patient Spontanous Breathing and Patient connected to nasal cannula oxygen  Post-op Assessment: Report given to RN and Post -op Vital signs reviewed and stable  Post vital signs: Reviewed and stable  Last Vitals:  Filed Vitals:   08/21/14 1124  BP: 173/96  Pulse:   Temp:   Resp:     Complications: No apparent anesthesia complications

## 2014-08-21 NOTE — Progress Notes (Signed)
       Patient Name: Erin [name] Date of Encounter: 08/21/2014    SUBJECTIVE: She had transesophageal echo this morning. Because of left atrial thrombus, she was not able to undergo cardioversion. O2 sats are still borderline.  TELEMETRY:  Atrial fibrillation with moderate to poor rate control. Filed Vitals:   08/21/14 1200 08/21/14 1210 08/21/14 1220 08/21/14 1227  BP: 169/97 149/82 146/78 136/78  Pulse: 167 97 95 98  Temp:      TempSrc:      Resp: 30 21 22 19   Height:      Weight:      SpO2: 89% 89% 91% 92%    Intake/Output Summary (Last 24 hours) at 08/21/14 1421 Last data filed at 08/21/14 1134  Gross per 24 hour  Intake    620 ml  Output   2550 ml  Net  -1930 ml   LABS: Basic Metabolic Panel:  Recent Labs  27/74/12 0434  NA 137  K 4.2  CL 98*  CO2 30  GLUCOSE 95  BUN 11  CREATININE 0.97  CALCIUM 8.7*   CBC:  Recent Labs  08/21/14 0434  WBC 9.2  HGB 9.4*  HCT 30.1*  MCV 70.5*  PLT 280  Fasting Lipid Panel:  Recent Labs  08/21/14 0434  CHOL 130  HDL 25*  LDLCALC 91  TRIG 72  CHOLHDL 5.2    Radiology/Studies:  No new recent data  Physical Exam: Blood pressure 136/78, pulse 98, temperature 98.2 F (36.8 C), temperature source Oral, resp. rate 19, height 5' (1.524 m), weight 94.167 kg (207 lb 9.6 oz), SpO2 92 %. Weight change: -1.769 kg (-3 lb 14.4 oz)  Wt Readings from Last 3 Encounters:  08/21/14 94.167 kg (207 lb 9.6 oz)  08/15/14 99.1 kg (218 lb 7.6 oz)  08/22/13 95.142 kg (209 lb 12 oz)    Chest with diminished breath sounds Cardiac exam reveals irregularly irregular rhythm Extremities reveal no edema Alert and oriented  ASSESSMENT:  1. Atrial fibrillation with only moderate rate control. Cardioversion could not be performed because of left atrial thrombus. 2. Acute on chronic combined systolic and diastolic heart failure. Ejection fraction 30% by echo on 08/13/14 3. CAD, stable 4. Anemia with hemoglobin  9.4  Plan:  1. Continue amiodarone at a dose. We'll also need to be titrate carvedilol dose upward as tolerated by her pressure. 2. Outpatient electrical cardioversion after an additional 3-4 weeks of anticoagulation 3. Continue attempts at diuresis. Would change furosemide to 80 mg twice a day. 4. Attempt to transition all medications to oral dosing in preparation for eventual discharge , perhaps as early as tomorrow. 5. Discussed treatment plans with the patient and her daughter.  Selinda Eon 08/21/2014, 2:21 PM

## 2014-08-21 NOTE — Progress Notes (Signed)
CARDIAC REHAB PHASE I   PRE:  Rate/Rhythm: 93 a. fib  BP:  Sitting: 120/64        SaO2: 93 RA  MODE:  Ambulation: 176 ft   POST:  Rate/Rhythm: 118 a. fib  BP:  Sitting: 121/76         SaO2: 95 RA  Pt assisted to bathroom, ambulated 176 ft on RA, rolling walker, assist x1, slow, steady gait, tolerated fair. Pt has very limited activity tolerance and tires quickly. Pt sates 91-96 on RA during ambulation (pt does not qualify for home O2 at this time), HR 108-128 a.fib. Pt c/o significant DOE, instructed in pursed lip breathing, improved with rest, denies CP, dizziness, standing rest x2. Pt would benefit from rollator at discharge to be able to take seated rests if needed. Pt to recliner after walk, feet elevated, call bell within reach.   [phone]  [name], RN, BSN 08/21/2014 3:23 PM

## 2014-08-21 NOTE — Anesthesia Procedure Notes (Signed)
Procedure Name: Intubation Date/Time: 08/21/2014 10:40 AM Performed by: Eligha Bridegroom Pre-anesthesia Checklist: Patient identified, Timeout performed, Emergency Drugs available, Suction available and Patient being monitored Patient Re-evaluated:Patient Re-evaluated prior to inductionOxygen Delivery Method: Circle system utilized Preoxygenation: Pre-oxygenation with 100% oxygen Intubation Type: IV induction Laryngoscope Size: Mac and 3 Grade View: Grade I Tube type: Oral Tube size: 7.0 mm Number of attempts: 1 Airway Equipment and Method: Stylet and LTA kit utilized Secured at: 21 cm Tube secured with: Tape Dental Injury: Teeth and Oropharynx as per pre-operative assessment

## 2014-08-22 ENCOUNTER — Encounter (HOSPITAL_COMMUNITY): Payer: Self-pay | Admitting: Cardiovascular Disease

## 2014-08-22 ENCOUNTER — Other Ambulatory Visit: Payer: Self-pay | Admitting: Physician Assistant

## 2014-08-22 DIAGNOSIS — I213 ST elevation (STEMI) myocardial infarction of unspecified site: Secondary | ICD-10-CM

## 2014-08-22 DIAGNOSIS — N189 Chronic kidney disease, unspecified: Secondary | ICD-10-CM

## 2014-08-22 LAB — BASIC METABOLIC PANEL
ANION GAP: 8 (ref 5–15)
BUN: 10 mg/dL (ref 6–20)
CALCIUM: 9 mg/dL (ref 8.9–10.3)
CO2: 32 mmol/L (ref 22–32)
CREATININE: 0.88 mg/dL (ref 0.44–1.00)
Chloride: 98 mmol/L — ABNORMAL LOW (ref 101–111)
Glucose, Bld: 91 mg/dL (ref 65–99)
Potassium: 3.9 mmol/L (ref 3.5–5.1)
SODIUM: 138 mmol/L (ref 135–145)

## 2014-08-22 LAB — CBC
HCT: 32.7 % — ABNORMAL LOW (ref 36.0–46.0)
HEMOGLOBIN: 10.3 g/dL — AB (ref 12.0–15.0)
MCH: 22.2 pg — AB (ref 26.0–34.0)
MCHC: 31.5 g/dL (ref 30.0–36.0)
MCV: 70.6 fL — ABNORMAL LOW (ref 78.0–100.0)
PLATELETS: 315 10*3/uL (ref 150–400)
RBC: 4.63 MIL/uL (ref 3.87–5.11)
RDW: 18.1 % — ABNORMAL HIGH (ref 11.5–15.5)
WBC: 8.4 10*3/uL (ref 4.0–10.5)

## 2014-08-22 MED ORDER — POTASSIUM CHLORIDE ER 20 MEQ PO TBCR: 20.0000 meq | EXTENDED_RELEASE_TABLET | Freq: Every day | ORAL | Status: DC

## 2014-08-22 MED ORDER — APIXABAN 5 MG PO TABS: 5.0000 mg | ORAL_TABLET | Freq: Two times a day (BID) | ORAL | Status: DC

## 2014-08-22 MED ORDER — CARVEDILOL 6.25 MG PO TABS: 6.2500 mg | ORAL_TABLET | Freq: Two times a day (BID) | ORAL | Status: DC

## 2014-08-22 MED ORDER — AMIODARONE HCL 200 MG PO TABS: 200.0000 mg | ORAL_TABLET | Freq: Two times a day (BID) | ORAL | Status: DC

## 2014-08-22 MED ORDER — SPIRONOLACTONE 25 MG PO TABS: 12.5000 mg | ORAL_TABLET | Freq: Every day | ORAL | Status: DC

## 2014-08-22 MED ORDER — FUROSEMIDE 80 MG PO TABS: 80.0000 mg | ORAL_TABLET | Freq: Two times a day (BID) | ORAL | Status: DC

## 2014-08-22 NOTE — Progress Notes (Signed)
Patient discharging home today on Eliquis- coupon card given to patient with an explanation of usage. Abelino Derrick [facility] [phone]

## 2014-08-22 NOTE — Progress Notes (Signed)
       Patient Name: Erin [name] Date of Encounter: 08/22/2014    SUBJECTIVE: Sitting at bedside. Feels well. Ambulated some yesterday.  TELEMETRY:  Atrial fibrillation with only moderate rate control. Heart rate ranged between 80 and 115 bpm Filed Vitals:   08/21/14 1227 08/21/14 1452 08/21/14 2003 08/22/14 0532  BP: 136/78 120/64 125/74 104/67  Pulse: 98 90 83   Temp:   98.4 F (36.9 C) 97.9 F (36.6 C)  TempSrc:   Oral Oral  Resp: 19 18 18 18   Height:      Weight:    94.28 kg (207 lb 13.6 oz)  SpO2: 92% 94% 97% 100%    Intake/Output Summary (Last 24 hours) at 08/22/14 1005 Last data filed at 08/22/14 0947  Gross per 24 hour  Intake   1700 ml  Output   3200 ml  Net  -1500 ml   LABS: Basic Metabolic Panel:  Recent Labs  21/22/48 0434 08/22/14 0305  NA 137 138  K 4.2 3.9  CL 98* 98*  CO2 30 32  GLUCOSE 95 91  BUN 11 10  CREATININE 0.97 0.88  CALCIUM 8.7* 9.0   CBC:  Recent Labs  08/21/14 0434 08/22/14 0305  WBC 9.2 8.4  HGB 9.4* 10.3*  HCT 30.1* 32.7*  MCV 70.5* 70.6*  PLT 280 315   Cardiac Enzymes: No results for input(s): CKTOTAL, CKMB, CKMBINDEX, TROPONINI in the last 72 hours. BNP: Invalid input(s): POCBNP Hemoglobin A1C: No results for input(s): HGBA1C in the last 72 hours. Fasting Lipid Panel:  Recent Labs  08/21/14 0434  CHOL 130  HDL 25*  LDLCALC 91  TRIG 72  CHOLHDL 5.2    Radiology/Studies:  No new data  Physical Exam: Blood pressure 104/67, pulse 83, temperature 97.9 F (36.6 C), temperature source Oral, resp. rate 18, height 5' (1.524 m), weight 94.28 kg (207 lb 13.6 oz), SpO2 100 %. Weight change: 0.113 kg (4 oz)  Wt Readings from Last 3 Encounters:  08/22/14 94.28 kg (207 lb 13.6 oz)  08/15/14 99.1 kg (218 lb 7.6 oz)  08/22/13 95.142 kg (209 lb 12 oz)    Diminished basilar breath sounds Irregularly irregular rhythm. No murmur or gallop. No edema.  ASSESSMENT:  1. Persistent atrial fibrillation. Unable  to cardiovert because of left atrial appendage thrombus on TEE yesterday. 2. Acute on chronic systolic heart failure with documented EF asked and 30% 3. Anticoagulation therapy without bleeding. Had been previously on Coumadin but was taken the medication sporadically and would occasionally miss doses.  Plan:   Get all a.m. medications on board.  If ambulates without difficulty and blood pressure maintains will be able to discharge today  These transition of care follow-up within one week  Basic metabolic panel in the same time frame with low-dose Aldactone on board  Overall plan is for elective cardioversion 3-4 weeks after TEE which was performed yesterday.   Selinda Eon 08/22/2014, 10:05 AM

## 2014-08-22 NOTE — Progress Notes (Signed)
Rolator ordered from [facility] as requested. Abelino Derrick RN [phone]

## 2014-08-22 NOTE — Progress Notes (Signed)
Pt discharged home, taken out via wheelchair. Daughter at the bedside, discharge instructions given. Questions answered.

## 2014-08-22 NOTE — Progress Notes (Signed)
CARDIAC REHAB PHASE I   PRE:  Rate/Rhythm: 112 a fib  BP:  Sitting: 105/53        SaO2: 98 RA  MODE:  Ambulation: 480 ft   POST:  Rate/Rhythm: 130 a fib  BP:  Sitting: 131/68         SaO2: 93 RA  Pt ambulated 480 ft on RA, rolling walker, gait belt, assist x1,slow, steady gait, tolerated well. Pt c/o moderate DOE, improved from previous, denies CP, dizziness, standing rest x1. Pt significantly increased distance today. Pt sats 93-96 % on RA during ambulation, HR 118-137 a.fib, decreased to 108-110 with rest. Pt states she is hoping to go home today. Discussed possibility of rollator with case manager, CM to f/u with pt. Pt states she has no questions at this time regarding education, remains agreeable to [facility]. Pt to side of bed after walk, call bell within reach.   [phone]   [name], RN, BSN 08/22/2014 10:19 AM

## 2014-08-22 NOTE — Discharge Instructions (Signed)
Heart Failure °Heart failure means your heart has trouble pumping blood. This makes it hard for your body to work well. Heart failure is usually a long-term (chronic) condition. You must take good care of yourself and follow your doctor's treatment plan. °HOME CARE °· Take your heart medicine as told by your doctor. °¨ Do not stop taking medicine unless your doctor tells you to. °¨ Do not skip any dose of medicine. °¨ Refill your medicines before they run out. °¨ Take other medicines only as told by your doctor or pharmacist. °· Stay active if told by your doctor. The elderly and people with severe heart failure should talk with a doctor about physical activity. °· Eat heart-healthy foods. Choose foods that are without trans fat and are low in saturated fat, cholesterol, and salt (sodium). This includes fresh or frozen fruits and vegetables, fish, lean meats, fat-free or low-fat dairy foods, whole grains, and high-fiber foods. Lentils and dried peas and beans (legumes) are also good choices. °· Limit salt if told by your doctor. °· Cook in a healthy way. Roast, grill, broil, bake, poach, steam, or stir-fry foods. °· Limit fluids as told by your doctor. °· Weigh yourself every morning. Do this after you pee (urinate) and before you eat breakfast. Write down your weight to give to your doctor. °· Take your blood pressure and write it down if your doctor tells you to. °· Ask your doctor how to check your pulse. Check your pulse as told. °· Lose weight if told by your doctor. °· Stop smoking or chewing tobacco. Do not use gum or patches that help you quit without your doctor's approval. °· Schedule and go to doctor visits as told. °· Nonpregnant women should have no more than 1 drink a day. Men should have no more than 2 drinks a day. Talk to your doctor about drinking alcohol. °· Stop illegal drug use. °· Stay current with shots (immunizations). °· Manage your health conditions as told by your doctor. °· Learn to  manage your stress. °· Rest when you are tired. °· If it is really hot outside: °¨ Avoid intense activities. °¨ Use air conditioning or fans, or get in a cooler place. °¨ Avoid caffeine and alcohol. °¨ Wear loose-fitting, lightweight, and light-colored clothing. °· If it is really cold outside: °¨ Avoid intense activities. °¨ Layer your clothing. °¨ Wear mittens or gloves, a hat, and a scarf when going outside. °¨ Avoid alcohol. °· Learn about heart failure and get support as needed. °· Get help to maintain or improve your quality of life and your ability to care for yourself as needed. °GET HELP IF:  °· You gain 03 lb/1.4 kg or more in 1 day or 05 lb/2.3 kg in a week. °· You are more short of breath than usual. °· You cannot do your normal activities. °· You tire easily. °· You cough more than normal, especially with activity. °· You have any or more puffiness (swelling) in areas such as your hands, feet, ankles, or belly (abdomen). °· You cannot sleep because it is hard to breathe. °· You feel like your heart is beating fast (palpitations). °· You get dizzy or light-headed when you stand up. °GET HELP RIGHT AWAY IF:  °· You have trouble breathing. °· There is a change in mental status, such as becoming less alert or not being able to focus. °· You have chest pain or discomfort. °· You faint. °MAKE SURE YOU:  °· Understand these instructions. °·   Will watch your condition.  Will get help right away if you are not doing well or get worse. Document Released: 10/01/2007 Document Revised: 05/08/2013 Document Reviewed: 02/08/2012 [facility] Patient Information 2015 [place], [place]. This information is not intended to replace advice given to you by your health care provider. Make sure you discuss any questions you have with your health care provider. Information on my medicine - ELIQUIS (apixaban)  This medication education was reviewed with me or my healthcare representative as part of my discharge preparation.   The pharmacist that spoke with me during my hospital stay was:  Herby Abraham, [facility]  Why was Eliquis prescribed for you? Eliquis was prescribed for you to reduce the risk of a blood clot forming that can cause a stroke if you have a medical condition called atrial fibrillation (a type of irregular heartbeat).  What do You need to know about Eliquis ? Take your Eliquis TWICE DAILY - one tablet in the morning and one tablet in the evening with or without food. If you have difficulty swallowing the tablet whole please discuss with your pharmacist how to take the medication safely.  Take Eliquis exactly as prescribed by your doctor and DO NOT stop taking Eliquis without talking to the doctor who prescribed the medication.  Stopping may increase your risk of developing a stroke.  Refill your prescription before you run out.  After discharge, you should have regular check-up appointments with your healthcare provider that is prescribing your Eliquis.  In the future your dose may need to be changed if your kidney function or weight changes by a significant amount or as you get older.  What do you do if you miss a dose? If you miss a dose, take it as soon as you remember on the same day and resume taking twice daily.  Do not take more than one dose of ELIQUIS at the same time to make up a missed dose.  Important Safety Information A possible side effect of Eliquis is bleeding. You should call your healthcare provider right away if you experience any of the following: ? Bleeding from an injury or your nose that does not stop. ? Unusual colored urine (red or dark brown) or unusual colored stools (red or black). ? Unusual bruising for unknown reasons. ? A serious fall or if you hit your head (even if there is no bleeding).  Some medicines may interact with Eliquis and might increase your risk of bleeding or clotting while on Eliquis. To help avoid this, consult your healthcare provider or  pharmacist prior to using any new prescription or non-prescription medications, including herbals, vitamins, non-steroidal anti-inflammatory drugs (NSAIDs) and supplements.  This website has more information on Eliquis (apixaban): http://www.eliquis.com/eliquis/homeInformation on my medicine - ELIQUIS (apixaban)  This medication education was reviewed with me or my healthcare representative as part of my discharge preparation.  The pharmacist that spoke with me during my hospital stay was:  Herby Abraham, [facility]  Why was Eliquis prescribed for you? Eliquis was prescribed for you to reduce the risk of a blood clot forming that can cause a stroke if you have a medical condition called atrial fibrillation (a type of irregular heartbeat).  What do You need to know about Eliquis ? Take your Eliquis TWICE DAILY - one tablet in the morning and one tablet in the evening with or without food. If you have difficulty swallowing the tablet whole please discuss with your pharmacist how to take the medication safely.  Take Eliquis exactly as prescribed by your doctor and DO NOT stop taking Eliquis without talking to the doctor who prescribed the medication.  Stopping may increase your risk of developing a stroke.  Refill your prescription before you run out.  After discharge, you should have regular check-up appointments with your healthcare provider that is prescribing your Eliquis.  In the future your dose may need to be changed if your kidney function or weight changes by a significant amount or as you get older.  What do you do if you miss a dose? If you miss a dose, take it as soon as you remember on the same day and resume taking twice daily.  Do not take more than one dose of ELIQUIS at the same time to make up a missed dose.  Important Safety Information A possible side effect of Eliquis is bleeding. You should call your healthcare provider right away if you experience any of the  following: ? Bleeding from an injury or your nose that does not stop. ? Unusual colored urine (red or dark brown) or unusual colored stools (red or black). ? Unusual bruising for unknown reasons. ? A serious fall or if you hit your head (even if there is no bleeding).  Some medicines may interact with Eliquis and might increase your risk of bleeding or clotting while on Eliquis. To help avoid this, consult your healthcare provider or pharmacist prior to using any new prescription or non-prescription medications, including herbals, vitamins, non-steroidal anti-inflammatory drugs (NSAIDs) and supplements.  This website has more information on Eliquis (apixaban): http://www.eliquis.com/eliquis/home

## 2014-08-22 NOTE — Discharge Summary (Signed)
Discharge Summary   Patient ID: Erin [name],  MRN: [id number], DOB/AGE: May 10, 1945 69 y.o.  Admit date: 08/15/2014 Discharge date: 08/22/2014  Primary Care Provider: Charlton Haws Primary Cardiologist: Dr. Eden Emms Primary Electrophysiologist: Dr. Graciela Husbands  Discharge Diagnoses Principal Problem:   Acute on chronic systolic and diastolic heart failure, NYHA class 4 Active Problems:   Atrial fibrillation with RVR   Long term current use of anticoagulant therapy   AICD (automatic cardioverter/defibrillator) present   HTN (hypertension)   H/O medication noncompliance   Coronary artery disease involving native coronary artery of native heart without angina pectoris   Allergies No Known Allergies  Procedures  Echocardiogram 08/13/2014 LV EF: 25% -  30%  ------------------------------------------------------------------- Indications:   428.0 CHF.  ------------------------------------------------------------------- Study Conclusions  - Left ventricle: The cavity size was normal. Systolic function was severely reduced. The estimated ejection fraction was in the range of 25% to 30%. Diffuse hypokinesis. Regional wall motion abnormalities cannot be excluded. - Mitral valve: There was mild regurgitation. - Left atrium: The atrium was mildly dilated. - Right ventricle: The cavity size was mildly dilated. Wall thickness was normal. Pacer wire or catheter noted in right ventricle. Systolic function was normal. - Right atrium: The atrium was mildly dilated. - Tricuspid valve: There was mild-moderate regurgitation. - Pulmonary arteries: Systolic pressure was high normal.  Impressions:  - Rhythm is atrial fibrillation.    Cardiac catheterization 08/17/2014 Conclusion     Prox RCA lesion, 30% stenosed.  Post Atrio lesion, 25% stenosed.  Mid Cx lesion, 100% stenosed.  1st Diag lesion, 40% stenosed.  Mid LAD lesion, 70% stenosed.  1. Double vessel CAD with  chronic total occlusion of mid Circumflex and moderate stenosis mid LAD 2. Chronically occluded mid Circumflex with distal filling from right to left and left to left collaterals. 3. Moderate stenosis mid LAD (FFR 0.82-not flow limiting) 4. Mild disease RCA 5. Elevated filling pressures  Recommendations: She has a CTO of the Circumflex and moderate disease in the LAD. The LAD is not flow limiting at this time. Medical management of CAD at this time. If her LAD disease progresses would consider CABG for complete revascularization. Continue diuresis. Rounding team to make further plans regarding atrial fibrillation management.      TEE  08/21/2014 LVEF 20%. Global hypokinesis. Moderate MR. LA appendage thrombus and sluggish flow.  See full report for full details.       DCCV aborted given LA thrombus          Hospital Course  The patient is a 69 year old female with history of nonischemic myopathy by cardiac cath 2003 s/p AICD placement in July 2014, history of chronic systolic heart failure, severe dilated cardiomyopathy, history of PAF (CHADSVASc at least 4 CHF, HTN, age, female) on Coumadin, medication noncompliance, HTN, HLD, obesity and OSA on CPAP who presented to [facility] on 8/80/2016 with increasing shortness of breath and lower extremity edema for one week. She was found to be in atrial fibrillation with RVR on arrival along with acute on chronic systolic heart failure. She has known severely decreased EF as low as 10-20% in 2007 in the setting of nonischemic cardiomyopathy, cardiac catheterization at the time showed mid LAD was 50% stenosis, normal left circumflex, normal left main, normal RCA. Her AICD was placed in July 2014 for primary prevention. Her last known EF 25% in 2014. She has been on Coreg and amiodarone at home. Her paroxysmal atrial fibrillation has been previously on well-controlled by ICD interrogation. She has been  on Coumadin for many years. She was not on NOAC due  to concern for close issue.  On arrival to [facility], her INR was found to be subtherapeutic at 1.28. She had not taken any of her medication for 5-6 days. Note she has history of noncompliance in the past. She complained of increasing fatigue, bloating, abdominal fullness. On arrival, her heart rate was in the 130s and at times goes up to 160 - 190s. Given her subtherapeutic INR, she was transitioned to eliquis. She was initially underwent aggressive diuresis, however by the time cardiology was consulted, her creatinine was trending up from 0.85 to 1.5, possibly secondary to ATN in the setting of hypertension. Her losartan was held. She was placed temporarily on low-dose dopamine to aid with diuresis. Echocardiogram was obtained on 8/8 which showed EF 25-30%, diffuse hypokinesis, regional wall motion abnormality cannot be excluded, mild MR, mildly dilated RV, mild to moderate TR. She was transferred to [facility] on 08/16/2014 for further heart failure management. Given the fact that her last cath was over 13 years ago and could not rule out wall motion abnormalities on the recent echo, the plan was for her to undergo L&R HC once her renal function improved and eliquis was held. She underwent a left and right heart cath on 08/17/2014 showed two-vessel disease with chronically ill total occlusion of the mid left circumflex, moderate stenosis in mid LAD 70% with negative FFR 0.82, mild disease in RCA, elevated filling pressure. Her cardiac index was 2.1, cardiac output 4.01. Medical therapy was recommended. However if her LAD disease progresses, may consider CABG later for complete revascularization. As for her paroxysmal atrial fibrillation, patient was placed on the schedule for TEE/DC cardioversion. Unfortunately TEE on 8/16 showed 20 EF 20%, moderate MR, left atrial appendage thrombus and sluggish flow, given the presence of left atrial thrombus, she was not cardioverted.   She was seen in the morning of  08/22/2014, at which time she was feeling well. She ambulated with cardiac rehabilitation without significant discomfort. Her O2 saturation was 93-96% on room air during ambulation. She is deemed stable for discharge from cardiology perspective. She has a follow-up scheduled next Monday with Dr. Eden Emms at which time patient will have a BMET lab given recent addition of aldactone. Of note, during this hospitalization, patient was also noted to have hyperthyroidism with TSH 0.018, free T4 2.58. Unfortunately, her heart rate is difficult to control without amiodarone. I have discussed this case with Dr. Katrinka Blazing, patient will need outpatient endocrinology follow-up as soon as possible. Of note, patient will also set up with her new primary care physician next Thursday. The plan is for elective cardioversion in 3-4 weeks if she can demonstrate compliance with eliquis.  Of note during this hospitalization, her carvedilol was decreased from 25 to 6.25 mg twice a day given hypotension.    Discharge Vitals Blood pressure 94/47, pulse 103, temperature 98 F (36.7 C), temperature source Oral, resp. rate 18, height 5' (1.524 m), weight 207 lb 13.6 oz (94.28 kg), SpO2 94 %.  Filed Weights   08/20/14 0600 08/21/14 0624 08/22/14 0532  Weight: 211 lb 8.5 oz (95.95 kg) 207 lb 9.6 oz (94.167 kg) 207 lb 13.6 oz (94.28 kg)    Labs  CBC  Recent Labs  08/21/14 0434 08/22/14 0305  WBC 9.2 8.4  HGB 9.4* 10.3*  HCT 30.1* 32.7*  MCV 70.5* 70.6*  PLT 280 315   Basic Metabolic Panel  Recent Labs  08/21/14 0434  08/22/14 0305  NA 137 138  K 4.2 3.9  CL 98* 98*  CO2 30 32  GLUCOSE 95 91  BUN 11 10  CREATININE 0.97 0.88  CALCIUM 8.7* 9.0   Fasting Lipid Panel  Recent Labs  08/21/14 0434  CHOL 130  HDL 25*  LDLCALC 91  TRIG 72  CHOLHDL 5.2   Thyroid Function Tests  Recent Labs  08/20/14 1447  TSH 0.018*    Disposition  Pt is being discharged home today in good condition.  Follow-up Plans  & Appointments      Follow-up Information    Follow up with Charlton Haws, MD On 08/27/2014.   Specialty:  Cardiology   Why:  9am. Please obtain BMET on the same day by arrive 30 min early.   Contact information:   [address] [place] [address] [phone]       Follow up with Delma Freeze, FNP On 08/30/2014.   Specialty:  Family Medicine   Why:  11AM   Contact information:   [address] [place] [address] [phone]       Follow up with Carlus Pavlov, MD.   Specialty:  Internal Medicine   Why:  Please call [facility] to arrange for followup or check with your primary care physician for endocrinology referal in [place] area for hyperthyroidism   Contact information:   [address] [place] [address] [phone]       Discharge Medications    Medication List    STOP taking these medications        cephALEXin 250 MG capsule  Commonly known as:  KEFLEX     DOPamine 3.2-5 MG/ML-% infusion  Commonly known as:  INTROPIN     heparin 100-0.45 UNIT/ML-% infusion     losartan 100 MG tablet  Commonly known as:  COZAAR     warfarin 5 MG tablet  Commonly known as:  COUMADIN      TAKE these medications        amiodarone 200 MG tablet  Commonly known as:  PACERONE  Take 1 tablet (200 mg total) by mouth 2 (two) times daily.     apixaban 5 MG Tabs tablet  Commonly known as:  ELIQUIS  Take 1 tablet (5 mg total) by mouth 2 (two) times daily.     carvedilol 6.25 MG tablet  Commonly known as:  COREG  Take 1 tablet (6.25 mg total) by mouth 2 (two) times daily with a meal.     furosemide 80 MG tablet  Commonly known as:  LASIX  Take 1 tablet (80 mg total) by mouth 2 (two) times daily.     ondansetron 4 MG tablet  Commonly known as:  ZOFRAN  Take 1 tablet (4 mg total) by mouth every 6 (six) hours as needed for nausea.     Potassium Chloride ER 20 MEQ Tbcr  Take 20 mEq by  mouth daily.     spironolactone 25 MG tablet  Commonly known as:  ALDACTONE  Take 0.5 tablets (12.5 mg total) by mouth daily.        Outstanding Labs/Studies  BMET on the same day of followup  Duration of Discharge Encounter   Greater than 30 minutes including physician time.  Ramond Dial PA-C Pager: [phone] 08/22/2014, 1:14 PM

## 2014-08-25 NOTE — Progress Notes (Signed)
Patient ID: Erin [name], female   DOB: 07/04/1945, 69 y.o.   MRN: [id number]   F/U post hospital d/c 8/17.  Not involved with her care.   The patient is a 69 y.o.  female with history of nonischemic myopathy by cardiac cath 2003 s/p AICD placement in July 2014, history of chronic systolic heart failure, severe dilated cardiomyopathy, history of PAF (CHADSVASc at least 4 CHF, HTN, age, female) on Coumadin, medication noncompliance, HTN, HLD, obesity and OSA on CPAP who presented to [facility] on 8/80/2016 with increasing shortness of breath and lower extremity edema for one week. She was found to be in atrial fibrillation with RVR on arrival along with acute on chronic systolic heart failure. She has known severely decreased EF as low as 10-20% in 2007 in the setting of nonischemic cardiomyopathy, cardiac catheterization at the time showed mid LAD was 50% stenosis, normal left circumflex, normal left main, normal RCA. Her AICD was placed in July 2014 for primary prevention. Her last known EF 25% in 2014. She has been on Coreg and amiodarone at home. Her paroxysmal atrial fibrillation has been previously on well-controlled by ICD interrogation. She has been on Coumadin for many years. She was not on NOAC due to concern for close issue.  On arrival to [facility], her INR was found to be subtherapeutic at 1.28. She had not taken any of her medication for 5-6 days. Note she has history of noncompliance in the past. She complained of increasing fatigue, bloating, abdominal fullness. On arrival, her heart rate was in the 130s and at times goes up to 160 - 190s. Given her subtherapeutic INR, she was transitioned to eliquis. She was initially underwent aggressive diuresis, however by the time cardiology was consulted, her creatinine was trending up from 0.85 to 1.5, possibly secondary to ATN in the setting of hypertension. Her losartan was held. She was placed temporarily on low-dose dopamine to aid with diuresis. Echocardiogram  was obtained on 8/8 which showed EF 25-30%, diffuse hypokinesis, regional wall motion abnormality cannot be excluded, mild MR, mildly dilated RV, mild to moderate TR. She was transferred to [facility] on 08/16/2014 for further heart failure management. Given the fact that her last cath was over 13 years ago and could not rule out wall motion abnormalities on the recent echo, the plan was for her to undergo L&R HC once her renal function improved and eliquis was held. She underwent a left and right heart cath on 08/17/2014 showed two-vessel disease with chronically ill total occlusion of the mid left circumflex, moderate stenosis in mid LAD 70% with negative FFR 0.82, mild disease in RCA, elevated filling pressure. Her cardiac index was 2.1, cardiac output 4.01. Medical therapy was recommended. However if her LAD disease progresses, may consider CABG later for complete revascularization. As for her paroxysmal atrial fibrillation, patient was placed on the schedule for TEE/DC cardioversion. Unfortunately TEE on 8/16 showed 20 EF 20%, moderate MR, left atrial appendage thrombus and sluggish flow, given the presence of left atrial thrombus, she was not cardioverted.   She was seen in the morning of 08/22/2014, at which time she was feeling well. She ambulated with cardiac rehabilitation without significant discomfort. Her O2 saturation was 93-96% on room air during ambulation. She is deemed stable for discharge from cardiology perspective. She has a follow-up scheduled next Monday with Dr. Eden Emms at which time patient will have a BMET lab given recent addition of aldactone. Of note, during this hospitalization, patient was also  noted to have hyperthyroidism with TSH 0.018, free T4 2.58. Unfortunately, her heart rate is difficult to control without amiodarone. I have discussed this case with Dr. Katrinka Blazing, patient will need outpatient endocrinology follow-up as soon as possible. Of note, patient will also set up with  her new primary care physician next Thursday. The plan is for elective cardioversion in 3-4 weeks if she can demonstrate compliance with eliquis.  Of note during this hospitalization, her carvedilol was decreased from 25 to 6.25 mg twice a day given hypotension.  Also after review of labs noted abnormal thyroid on amiodarone:  Lab Results  Component Value Date   TSH 0.018* 08/20/2014     Physical Exam: Affect appropriate Obese black female  HEENT: normal Neck supple with no adenopathy JVP normal no bruits no thyromegaly Lungs clear with no wheezing and good diaphragmatic motion Heart:  S1/S2 no murmur, no rub, gallop or click AICD under left clavicle  PMI normal Abdomen: benighn, BS positve, no tenderness, no AAA no bruit.  No HSM or HJR Distal pulses intact with no bruits No edema Neuro non-focal Skin warm and dry No muscular weakness    Imaging: No results found.  Cardiac Studies:   ECG:  08/13/14  afib rate 133 low voltage    Echo:  08/13/14 Study Conclusions  - Left ventricle: The cavity size was normal. Systolic function was severely reduced. The estimated ejection fraction was in the range of 25% to 30%. Diffuse hypokinesis. Regional wall motion abnormalities cannot be excluded. - Mitral valve: There was mild regurgitation. - Left atrium: The atrium was mildly dilated. - Right ventricle: The cavity size was mildly dilated. Wall thickness was normal. Pacer wire or catheter noted in right ventricle. Systolic function was normal. - Right atrium: The atrium was mildly dilated. - Tricuspid valve: There was mild-moderate regurgitation. - Pulmonary arteries: Systolic pressure was high normal.  Medications:   Current outpatient prescriptions:  .  amiodarone (PACERONE) 200 MG tablet, Take 1 tablet (200 mg total) by mouth 2 (two) times daily., Disp: 60 tablet, Rfl: 3 .  apixaban (ELIQUIS) 5 MG TABS tablet, Take 1 tablet (5 mg total) by mouth 2 (two) times  daily., Disp: 60 tablet, Rfl: 11 .  carvedilol (COREG) 6.25 MG tablet, Take 1 tablet (6.25 mg total) by mouth 2 (two) times daily with a meal., Disp: 60 tablet, Rfl: 5 .  furosemide (LASIX) 80 MG tablet, Take 1 tablet (80 mg total) by mouth 2 (two) times daily., Disp: 60 tablet, Rfl: 3 .  ondansetron (ZOFRAN) 4 MG tablet, Take 1 tablet (4 mg total) by mouth every 6 (six) hours as needed for nausea., Disp: 20 tablet, Rfl: 0 .  potassium chloride 20 MEQ TBCR, Take 20 mEq by mouth daily., Disp: 30 tablet, Rfl: 3 .  spironolactone (ALDACTONE) 25 MG tablet, Take 0.5 tablets (12.5 mg total) by mouth daily., Disp: 30 tablet, Rfl: 3     Assessment/Plan:  Afib:  Will tentatively schedule [facility] for 3 weeks.  She would benefit from maint NSR given low EF.  Don't think repeat TEE needed so long as she is  Compliant with eliquis.  Have reached out to [facility] to get his opinion on alternative antiarrhythmic  But don't see any other option than [facility] of AAD or Using amiodarone.  Referred to endocrine to see if Rx methimazole would be indicated but told patient I don't like causing a problem with a medicine  And Rx with another.    CHF:  Euvolemic continue current meds  AICD:  ? Biv upgrade would be helpful  F/u [facility]  CAD:  Recent cath with total circumflex and moderate LAD with normal FFR.   Thyroid:  F/u endocrine will not keep on amiodarone long term stop it after [facility]  Orders written and [facility] scheduled for 3 weeks  Charlton Haws 08/25/2014, 8:48 PM

## 2014-08-27 ENCOUNTER — Encounter: Payer: Self-pay | Admitting: Cardiovascular Disease

## 2014-08-27 ENCOUNTER — Ambulatory Visit (INDEPENDENT_AMBULATORY_CARE_PROVIDER_SITE_OTHER): Payer: Medicare Other | Admitting: Cardiovascular Disease

## 2014-08-27 ENCOUNTER — Other Ambulatory Visit: Payer: Self-pay | Admitting: *Deleted

## 2014-08-27 VITALS — BP 154/72 | HR 76 | Ht 60.0 in | Wt 188.4 lb

## 2014-08-27 DIAGNOSIS — I480 Paroxysmal atrial fibrillation: Secondary | ICD-10-CM

## 2014-08-27 DIAGNOSIS — E039 Hypothyroidism, unspecified: Secondary | ICD-10-CM | POA: Diagnosis not present

## 2014-08-27 LAB — TSH: TSH: 0.03 u[IU]/mL — AB (ref 0.35–4.50)

## 2014-08-27 LAB — T4, FREE: Free T4: 2.84 ng/dL — ABNORMAL HIGH (ref 0.60–1.60)

## 2014-08-27 NOTE — Patient Instructions (Addendum)
Medication Instructions:  NO CHANGES  Labwork: TODAY   TSH   FREE T4 BMET   AND  CBC   09-17-14  Testing/Procedures: Your physician has recommended that you have a Cardioversion (DCCV). Electrical Cardioversion uses a jolt of electricity to your heart either through paddles or wired patches attached to your chest. This is a controlled, usually prescheduled, procedure. Defibrillation is done under light anesthesia in the hospital, and you usually go home the day of the procedure. This is done to get your heart back into a normal rhythm. You are not awake for the procedure. Please see the instruction sheet given to you today.  09/19/14    Follow-Up: Your physician recommends that you schedule a follow-up appointment in: FOLLOW  UP AFTER  CARDIOVERSION WITH DR Logan Bores have been referred to [facility]   ENDOCRINOLOGISTS    ASAP    HYPO THYROID   Any Other Special Instructions Will Be Listed Below (If Applicable).

## 2014-08-30 ENCOUNTER — Encounter: Payer: Self-pay | Admitting: Family

## 2014-08-30 ENCOUNTER — Ambulatory Visit: Payer: Medicare Other | Attending: Family | Admitting: Family

## 2014-08-30 VITALS — BP 114/48 | HR 87 | Resp 20 | Ht 60.0 in | Wt 185.0 lb

## 2014-08-30 DIAGNOSIS — I5022 Chronic systolic (congestive) heart failure: Secondary | ICD-10-CM | POA: Diagnosis present

## 2014-08-30 DIAGNOSIS — G4730 Sleep apnea, unspecified: Secondary | ICD-10-CM | POA: Insufficient documentation

## 2014-08-30 DIAGNOSIS — G4731 Primary central sleep apnea: Secondary | ICD-10-CM

## 2014-08-30 DIAGNOSIS — Z7901 Long term (current) use of anticoagulants: Secondary | ICD-10-CM | POA: Diagnosis not present

## 2014-08-30 DIAGNOSIS — Z79899 Other long term (current) drug therapy: Secondary | ICD-10-CM | POA: Insufficient documentation

## 2014-08-30 DIAGNOSIS — I429 Cardiomyopathy, unspecified: Secondary | ICD-10-CM | POA: Insufficient documentation

## 2014-08-30 DIAGNOSIS — I480 Paroxysmal atrial fibrillation: Secondary | ICD-10-CM | POA: Diagnosis not present

## 2014-08-30 DIAGNOSIS — I2510 Atherosclerotic heart disease of native coronary artery without angina pectoris: Secondary | ICD-10-CM | POA: Diagnosis not present

## 2014-08-30 DIAGNOSIS — G4733 Obstructive sleep apnea (adult) (pediatric): Secondary | ICD-10-CM | POA: Diagnosis not present

## 2014-08-30 DIAGNOSIS — Z6836 Body mass index (BMI) 36.0-36.9, adult: Secondary | ICD-10-CM | POA: Insufficient documentation

## 2014-08-30 DIAGNOSIS — E669 Obesity, unspecified: Secondary | ICD-10-CM | POA: Insufficient documentation

## 2014-08-30 DIAGNOSIS — E780 Pure hypercholesterolemia: Secondary | ICD-10-CM | POA: Diagnosis not present

## 2014-08-30 DIAGNOSIS — I10 Essential (primary) hypertension: Secondary | ICD-10-CM | POA: Insufficient documentation

## 2014-08-30 NOTE — Progress Notes (Signed)
Subjective:    Patient ID: Erin [name], female    DOB: July 05, 1945, 69 y.o.   MRN: [id number]  Congestive Heart Failure Presents for initial visit. The disease course has been improving. Associated symptoms include fatigue (better) and shortness of breath. Pertinent negatives include no abdominal pain, chest pain, chest pressure, edema, orthopnea, palpitations or paroxysmal nocturnal dyspnea. The symptoms have been improving. Past treatments include beta blockers, salt and fluid restriction and aldosterone receptor blockers. The treatment provided moderate relief. Compliance with prior treatments has been good. Her past medical history is significant for arrhythmia and HTN. Compliance with total regimen is 76-100%.  Atrial Fibrillation Presents for initial visit. Symptoms include hypotension and shortness of breath. Symptoms are negative for an AICD problem, bradycardia, chest pain, dizziness, palpitations and weakness. The symptoms have been improving. Past treatments include antiarrhythmics, anticoagulant and beta blockers. Past medical history includes atrial fibrillation, AICD, CHF and HTN.    Past Medical History  Diagnosis Date  . Hypertension   . Chronic systolic CHF (congestive heart failure)     a. echo 2014: EF 25%, diffuse HK, LA moderately dilated, RA mildly dilated, PASP 38 mm Hg; b. echo 08/2014: EF 25-30%, diffuse HK, RWMA cannot be excluded, mild MR, mild biatrial enlargement, RV mildly dilated, wall thickness nl, pacer wire/catheter noted, mild to mod TR, PASP high nl  . PAF (paroxysmal atrial fibrillation)     a. on warfarin--> Eliquis 08/2014  . Encounter for long-term (current) use of anticoagulants   . Automatic implantable cardioverter-defibrillator in situ     a. s/p MDT ICD 07/2012; b. SN # [id number]   . High cholesterol   . Anginal pain   . Exertional shortness of breath   . OSA on CPAP     "suppose to; not often" (07/22/2012)  . Other "heavy-for-dates" infants    . NICM (nonischemic cardiomyopathy)     a. s/p AICD 07/22/2012  . Coronary artery disease, non-occlusive     a. cath 2003: LM nl, mid LAD 50%, LCx nl, RCA nl b. L&RHC 08/17/2014 100% occluded mid LCx, 70% mid LAD with FFR 0.82. Medical therapy. CI 2.1. CO 4.01  . H/O medication noncompliance   . Obesity    Past Surgical History  Procedure Laterality Date  . Cardiac defibrillator placement  07/22/2012    dual-chamber ICD.  Marland Kitchen Abdominal hysterectomy  ~ 1998    "laparoscopic" (07/22/2012)  . Tubal ligation  ~ 1978  . Cardiac catheterization  ? 1990's  . Implantable cardioverter defibrillator implant N/A 07/22/2012    Procedure: IMPLANTABLE CARDIOVERTER DEFIBRILLATOR IMPLANT;  Surgeon: Marinus Maw, MD;  Location: [facility] CATH LAB;  Service: Cardiovascular;  Laterality: N/A;  . Cardiac catheterization N/A 08/17/2014    Procedure: Right/Left Heart Cath and Coronary Angiography;  Surgeon: Kathleene Hazel, MD;  Location: [facility] INVASIVE CV LAB;  Service: Cardiovascular;  Laterality: N/A;  . Tee without cardioversion N/A 08/21/2014    Procedure: TRANSESOPHAGEAL ECHOCARDIOGRAM (TEE);  Surgeon: Chilton Si, MD;  Location: [facility] ENDOSCOPY;  Service: Cardiovascular;  Laterality: N/A;    Family History  Problem Relation Age of Onset  . Heart disease Mother   . Heart disease Father   . Lung disease Sister   . Diabetes Sister   . Heart failure Brother     Social History  Substance Use Topics  . Smoking status: Never Smoker   . Smokeless tobacco: Never Used  . Alcohol Use: No    No Known Allergies  Prior  to Admission medications   Medication Sig Start Date End Date Taking? Authorizing Provider  amiodarone (PACERONE) 200 MG tablet Take 1 tablet (200 mg total) by mouth 2 (two) times daily. 08/22/14  Yes Azalee Course, PA  apixaban (ELIQUIS) 5 MG TABS tablet Take 1 tablet (5 mg total) by mouth 2 (two) times daily. 08/22/14  Yes Azalee Course, PA  carvedilol (COREG) 6.25 MG tablet Take 1 tablet (6.25 mg  total) by mouth 2 (two) times daily with a meal. 08/22/14  Yes Azalee Course, PA  furosemide (LASIX) 80 MG tablet Take 1 tablet (80 mg total) by mouth 2 (two) times daily. 08/22/14  Yes Azalee Course, PA  potassium chloride 20 MEQ TBCR Take 20 mEq by mouth daily. 08/22/14  Yes Azalee Course, PA  spironolactone (ALDACTONE) 25 MG tablet Take 0.5 tablets (12.5 mg total) by mouth daily. 08/22/14  Yes Azalee Course, PA     Review of Systems  Constitutional: Positive for fatigue (better). Negative for appetite change.  HENT: Positive for rhinorrhea. Negative for sore throat.   Eyes: Negative.   Respiratory: Positive for cough (intermittent) and shortness of breath. Negative for chest tightness.   Cardiovascular: Negative for chest pain, palpitations and leg swelling.  Gastrointestinal: Negative for abdominal pain and abdominal distention.  Endocrine: Negative.   Genitourinary: Negative.   Musculoskeletal: Negative.   Skin: Negative.   Allergic/Immunologic: Negative.   Neurological: Negative for dizziness, facial asymmetry, weakness and light-headedness.  Hematological: Negative for adenopathy. Bruises/bleeds easily.  Psychiatric/Behavioral: Negative for sleep disturbance (sleeping on 2 pillow) and dysphoric mood. The patient is not nervous/anxious.        Objective:   Physical Exam  Constitutional: She is oriented to person, place, and time. She appears well-developed and well-nourished.  HENT:  Head: Normocephalic and atraumatic.  Eyes: Conjunctivae are normal. Pupils are equal, round, and reactive to light.  Neck: Normal range of motion. Neck supple.  Cardiovascular: Normal rate.  An irregular rhythm present.  Pulmonary/Chest: Effort normal. She has no wheezes. She has no rales.  Abdominal: Soft. She exhibits no distension. There is no tenderness.  Musculoskeletal: She exhibits no edema or tenderness.  Neurological: She is alert and oriented to person, place, and time.  Skin: Skin is warm and dry.   Psychiatric: She has a normal mood and affect. Her behavior is normal. Thought content normal.  Nursing note and vitals reviewed.   BP 114/48 mmHg  Pulse 87  Resp 20  Ht 5' (1.524 m)  Wt 185 lb (83.915 kg)  BMI 36.13 kg/m2  SpO2 96%       Assessment & Plan:  1: Chronic heart failure with reduced ejection fraction- Patient presents with some shortness of breath and fatigue upon exertion. She said that she didn't have any symptoms upon walking into the office today. . When she does get tired, she will stop what she's doing to rest until her energy level improves which she says occurs fairly quickly. No edema in her legs. She is already weighing herself daily and knows to call for an overnight weight gain of >2 pounds or a weekly weight gain of >5 pounds. She is not adding any salt to her food and her daughter is trying to use other seasonings like garlic to season the food. Discussed the importance of following a 2000mg  sodium diet and written information was given to them. Encouraged her to be drinking about 1.5 liters of fluids daily. Discussed possibly adding entresto and/or bidil in the future. 2:  HTN- Blood pressure looks good today. Continue to monitor. 3: Atrial fibrillation- Currently taking eliquis, amiodarone and carvedilol. Discussion had about doing a cardioversion in the next few weeks or so. Patient does have an AICD but says that it's never discharged 4: Sleep apnea- Patient says that she has a CPAP machine at home but she's inconsistent with wearing it. Discussed how untreated sleep apnea can affect her heart and that she should call the company and have them come out to make sure the mask is fitting correctly.   Return in 1 month or sooner for any questions/problems before the next office visit.

## 2014-08-30 NOTE — Patient Instructions (Addendum)
Continue weighing daily and call for an overnight weight gain of > 2 pounds or a weekly weight gain of >5 pounds.  Low-Sodium Eating Plan Sodium raises blood pressure and causes water to be held in the body. Getting less sodium from food will help lower your blood pressure, reduce any swelling, and protect your heart, liver, and kidneys. We get sodium by adding salt (sodium chloride) to food. Most of our sodium comes from canned, boxed, and frozen foods. Restaurant foods, fast foods, and pizza are also very high in sodium. Even if you take medicine to lower your blood pressure or to reduce fluid in your body, getting less sodium from your food is important. WHAT IS MY PLAN? Most people should limit their sodium intake to 2,300 mg a day. Your health care provider recommends that you limit your sodium intake to __2000mg__ a day.  WHAT DO I NEED TO KNOW ABOUT THIS EATING PLAN? For the low-sodium eating plan, you will follow these general guidelines:  Choose foods with a % Daily Value for sodium of less than 5% (as listed on the food label).   Use salt-free seasonings or herbs instead of table salt or sea salt.   Check with your health care provider or pharmacist before using salt substitutes.   Eat fresh foods.  Eat more vegetables and fruits.  Limit canned vegetables. If you do use them, rinse them well to decrease the sodium.   Limit cheese to 1 oz (28 g) per day.   Eat lower-sodium products, often labeled as "lower sodium" or "no salt added."  Avoid foods that contain monosodium glutamate (MSG). MSG is sometimes added to Chinese food and some canned foods.  Check food labels (Nutrition Facts labels) on foods to learn how much sodium is in one serving.  Eat more home-cooked food and less restaurant, buffet, and fast food.  When eating at a restaurant, ask that your food be prepared with less salt or none, if possible.  HOW DO I READ FOOD LABELS FOR SODIUM INFORMATION? The  Nutrition Facts label lists the amount of sodium in one serving of the food. If you eat more than one serving, you must multiply the listed amount of sodium by the number of servings. Food labels may also identify foods as:  Sodium free--Less than 5 mg in a serving.  Very low sodium--35 mg or less in a serving.  Low sodium--140 mg or less in a serving.  Light in sodium--50% less sodium in a serving. For example, if a food that usually has 300 mg of sodium is changed to become light in sodium, it will have 150 mg of sodium.  Reduced sodium--25% less sodium in a serving. For example, if a food that usually has 400 mg of sodium is changed to reduced sodium, it will have 300 mg of sodium. WHAT FOODS CAN I EAT? Grains Low-sodium cereals, including oats, puffed wheat and rice, and shredded wheat cereals. Low-sodium crackers. Unsalted rice and pasta. Lower-sodium bread.  Vegetables Frozen or fresh vegetables. Low-sodium or reduced-sodium canned vegetables. Low-sodium or reduced-sodium tomato sauce and paste. Low-sodium or reduced-sodium tomato and vegetable juices.  Fruits Fresh, frozen, and canned fruit. Fruit juice.  Meat and Other Protein Products Low-sodium canned tuna and salmon. Fresh or frozen meat, poultry, seafood, and fish. Lamb. Unsalted nuts. Dried beans, peas, and lentils without added salt. Unsalted canned beans. Homemade soups without salt. Eggs.  Dairy Milk. Soy milk. Ricotta cheese. Low-sodium or reduced-sodium cheeses. Yogurt.  Condiments Fresh   and dried herbs and spices. Salt-free seasonings. Onion and garlic powders. Low-sodium varieties of mustard and ketchup. Lemon juice.  Fats and Oils Reduced-sodium salad dressings. Unsalted butter.  Other Unsalted popcorn and pretzels.  The items listed above may not be a complete list of recommended foods or beverages. Contact your dietitian for more options. WHAT FOODS ARE NOT RECOMMENDED? Grains Instant hot cereals.  Bread stuffing, pancake, and biscuit mixes. Croutons. Seasoned rice or pasta mixes. Noodle soup cups. Boxed or frozen macaroni and cheese. Self-rising flour. Regular salted crackers. Vegetables Regular canned vegetables. Regular canned tomato sauce and paste. Regular tomato and vegetable juices. Frozen vegetables in sauces. Salted french fries. Olives. Pickles. Relishes. Sauerkraut. Salsa. Meat and Other Protein Products Salted, canned, smoked, spiced, or pickled meats, seafood, or fish. Bacon, ham, sausage, hot dogs, corned beef, chipped beef, and packaged luncheon meats. Salt pork. Jerky. Pickled herring. Anchovies, regular canned tuna, and sardines. Salted nuts. Dairy Processed cheese and cheese spreads. Cheese curds. Blue cheese and cottage cheese. Buttermilk.  Condiments Onion and garlic salt, seasoned salt, table salt, and sea salt. Canned and packaged gravies. Worcestershire sauce. Tartar sauce. Barbecue sauce. Teriyaki sauce. Soy sauce, including reduced sodium. Steak sauce. Fish sauce. Oyster sauce. Cocktail sauce. Horseradish. Regular ketchup and mustard. Meat flavorings and tenderizers. Bouillon cubes. Hot sauce. Tabasco sauce. Marinades. Taco seasonings. Relishes. Fats and Oils Regular salad dressings. Salted butter. Margarine. Ghee. Bacon fat.  Other Potato and tortilla chips. Corn chips and puffs. Salted popcorn and pretzels. Canned or dried soups. Pizza. Frozen entrees and pot pies.  The items listed above may not be a complete list of foods and beverages to avoid. Contact your dietitian for more information. Document Released: 06/13/2001 Document Revised: 12/27/2012 Document Reviewed: 10/26/2012 [employer] Patient Information 2015 [employer]. This information is not intended to replace advice given to you by your health care provider. Make sure you discuss any questions you have with your health care provider.  

## 2014-09-11 ENCOUNTER — Ambulatory Visit (INDEPENDENT_AMBULATORY_CARE_PROVIDER_SITE_OTHER): Payer: Medicare Other | Admitting: Endocrinology

## 2014-09-11 ENCOUNTER — Encounter: Payer: Self-pay | Admitting: Endocrinology

## 2014-09-11 VITALS — BP 137/84 | HR 91 | Temp 97.9°F | Ht 60.0 in | Wt 181.0 lb

## 2014-09-11 DIAGNOSIS — E0580 Other thyrotoxicosis without thyrotoxic crisis or storm: Secondary | ICD-10-CM

## 2014-09-11 MED ORDER — METHIMAZOLE 10 MG PO TABS: 10.0000 mg | ORAL_TABLET | Freq: Two times a day (BID) | ORAL | Status: DC

## 2014-09-11 NOTE — Progress Notes (Signed)
Subjective:    Patient ID: Erin [name], female    DOB: 02/19/1945, 69 y.o.   MRN: [id number]  HPI Pt was started on amiodarone in 2014.  In 2015, she had slightly elevated TSH.  Several measurements since then have been low.  she has never been on thyroid medication.  she has never had XRT to the anterior neck, or thyroid surgery.  she has never had thyroid imaging.  she does not consume kelp or any non-prescribed thyroid medication.  She has slight myalgias, worst at the legs, but no assoc weight change Past Medical History  Diagnosis Date  . Hypertension   . Chronic systolic CHF (congestive heart failure)     a. echo 2014: EF 25%, diffuse HK, LA moderately dilated, RA mildly dilated, PASP 38 mm Hg; b. echo 08/2014: EF 25-30%, diffuse HK, RWMA cannot be excluded, mild MR, mild biatrial enlargement, RV mildly dilated, wall thickness nl, pacer wire/catheter noted, mild to mod TR, PASP high nl  . PAF (paroxysmal atrial fibrillation)     a. on warfarin--> Eliquis 08/2014  . Encounter for long-term (current) use of anticoagulants   . Automatic implantable cardioverter-defibrillator in situ     a. s/p MDT ICD 07/2012; b. SN # [id number]   . High cholesterol   . Anginal pain   . Exertional shortness of breath   . OSA on CPAP     "suppose to; not often" (07/22/2012)  . Other "heavy-for-dates" infants   . NICM (nonischemic cardiomyopathy)     a. s/p AICD 07/22/2012  . Coronary artery disease, non-occlusive     a. cath 2003: LM nl, mid LAD 50%, LCx nl, RCA nl b. L&RHC 08/17/2014 100% occluded mid LCx, 70% mid LAD with FFR 0.82. Medical therapy. CI 2.1. CO 4.01  . H/O medication noncompliance   . Obesity     Past Surgical History  Procedure Laterality Date  . Cardiac defibrillator placement  07/22/2012    dual-chamber ICD.  Marland Kitchen Abdominal hysterectomy  ~ 1998    "laparoscopic" (07/22/2012)  . Tubal ligation  ~ 1978  . Cardiac catheterization  ? 1990's  . Implantable cardioverter defibrillator  implant N/A 07/22/2012    Procedure: IMPLANTABLE CARDIOVERTER DEFIBRILLATOR IMPLANT;  Surgeon: Marinus Maw, MD;  Location: [facility] CATH LAB;  Service: Cardiovascular;  Laterality: N/A;  . Cardiac catheterization N/A 08/17/2014    Procedure: Right/Left Heart Cath and Coronary Angiography;  Surgeon: Kathleene Hazel, MD;  Location: [facility] INVASIVE CV LAB;  Service: Cardiovascular;  Laterality: N/A;  . Tee without cardioversion N/A 08/21/2014    Procedure: TRANSESOPHAGEAL ECHOCARDIOGRAM (TEE);  Surgeon: Chilton Si, MD;  Location: [facility] ENDOSCOPY;  Service: Cardiovascular;  Laterality: N/A;    Social History   Social History  . Marital Status: Single    Spouse Name: N/A  . Number of Children: 3  . Years of Education: N/A   Occupational History  . Retired [employer]   Social History Main Topics  . Smoking status: Never Smoker   . Smokeless tobacco: Never Used  . Alcohol Use: No  . Drug Use: No  . Sexual Activity: No   Other Topics Concern  . Not on file   Social History Narrative    Current Outpatient Prescriptions on File Prior to Visit  Medication Sig Dispense Refill  . amiodarone (PACERONE) 200 MG tablet Take 1 tablet (200 mg total) by mouth 2 (two) times daily. 60 tablet 3  . apixaban (ELIQUIS) 5 MG TABS tablet  Take 1 tablet (5 mg total) by mouth 2 (two) times daily. 60 tablet 11  . carvedilol (COREG) 6.25 MG tablet Take 1 tablet (6.25 mg total) by mouth 2 (two) times daily with a meal. 60 tablet 5  . furosemide (LASIX) 80 MG tablet Take 1 tablet (80 mg total) by mouth 2 (two) times daily. 60 tablet 3  . potassium chloride 20 MEQ TBCR Take 20 mEq by mouth daily. 30 tablet 3  . spironolactone (ALDACTONE) 25 MG tablet Take 0.5 tablets (12.5 mg total) by mouth daily. 30 tablet 3   No current facility-administered medications on file prior to visit.    No Known Allergies  Family History  Problem Relation Age of Onset  . Heart disease Mother   . Heart disease Father   .  Lung disease Sister   . Diabetes Sister   . Heart failure Brother   . Thyroid disease Neg Hx     BP 137/84 mmHg  Pulse 91  Temp(Src) 97.9 F (36.6 C) (Oral)  Ht 5' (1.524 m)  Wt 181 lb (82.101 kg)  BMI 35.35 kg/m2  SpO2 92%  Review of Systems denies fever, headache, hoarseness, double vision, palpitations, sob, diarrhea, polyuria, muscle weakness, excessive diaphoresis, tremor, anxiety, heat intolerance, and rhinorrhea.  She has easy bruising.    Objective:   Physical Exam VS: see vs page GEN: no distress HEAD: head: no deformity eyes: no periorbital swelling, no proptosis external nose and ears are normal mouth: no lesion seen NECK: supple, thyroid is not enlarged CHEST WALL: no deformity LUNGS:  Clear to auscultation. CV: reg rate and rhythm, no murmur MUSCULOSKELETAL: muscle bulk and strength are grossly normal.  no obvious joint swelling.  gait is normal and steady EXTEMITIES: no deformity.  no ulcer on the feet.  feet are of normal color and temp.  no edema PULSES: dorsalis pedis intact bilat.  no carotid bruit NEURO:  cn 2-12 grossly intact.   readily moves all 4's.  sensation is intact to touch on the feet.  No tremor SKIN:  Normal texture and temperature.  No rash or suspicious lesion is visible.   NODES:  None palpable at the neck PSYCH: alert, well-oriented.  Does not appear anxious nor depressed.   Lab Results  Component Value Date   TSH 0.03* 08/27/2014   Radiol: CXR (08/13/14): no mention is made of a goiter  i personally reviewed electrocardiogram tracing (08/13/14): Indication: AF Impression: AF with RVR    Assessment & Plan:  Hyperthyroidism: new, prob due to amiodarone.  The reason for her susceptibility is uncertain, but is usually multinodular goiter.   PAF: there is no reason to d/c amiodarone--i'll work around that.    Patient is advised the following: Patient Instructions  i have sent a prescription to your pharmacy, to slow down the  thyroid. if ever you have fever while taking methimazole, stop it and call us, because of the risk of a rare side-effect Please come back for a follow-up appointment in 1 month.      Hyperthyroidism The thyroid is a large gland located in the lower front part of your neck. The thyroid helps control metabolism. Metabolism is how your body uses food. It controls metabolism with the hormone thyroxine. When the thyroid is overactive, it produces too much hormone. When this happens, these following problems may occur:   Nervousness  Heat intolerance  Weight loss (in spite of increase food intake)  Diarrhea  Change in hair or skin texture  Palpitations (heart skipping or having extra beats)  Tachycardia (rapid heart rate)  Loss of menstruation (amenorrhea)  Shaking of the hands CAUSES  Grave's Disease (the immune system attacks the thyroid gland). This is the most common cause.  Inflammation of the thyroid gland.  Tumor (usually benign) in the thyroid gland or elsewhere.  Excessive use of thyroid medications (both prescription and 'natural').  Excessive ingestion of Iodine. DIAGNOSIS  To prove hyperthyroidism, your caregiver may do blood tests and ultrasound tests. Sometimes the signs are hidden. It may be necessary for your caregiver to watch this illness with blood tests, either before or after diagnosis and treatment. TREATMENT Short-term treatment There are several treatments to control symptoms. Drugs called beta blockers may give some relief. Drugs that decrease hormone production will provide temporary relief in many people. These measures will usually not give permanent relief. Definitive therapy There are treatments available which can be discussed between you and your caregiver which will permanently treat the problem. These treatments range from surgery (removal of the thyroid), to the use of radioactive iodine (destroys the thyroid by radiation), to the use of  antithyroid drugs (interfere with hormone synthesis). The first two treatments are permanent and usually successful. They most often require hormone replacement therapy for life. This is because it is impossible to remove or destroy the exact amount of thyroid required to make a person euthyroid (normal). HOME CARE INSTRUCTIONS  See your caregiver if the problems you are being treated for get worse. Examples of this would be the problems listed above. SEEK MEDICAL CARE IF: Your general condition worsens. MAKE SURE YOU:   Understand these instructions.  Will watch your condition.  Will get help right away if you are not doing well or get worse. Document Released: 12/22/2004 Document Revised: 03/16/2011 Document Reviewed: 05/05/2006 [facility] Patient Information 2015 [place], [place]. This information is not intended to replace advice given to you by your health care provider. Make sure you discuss any questions you have with your health care provider.

## 2014-09-11 NOTE — Patient Instructions (Addendum)
i have sent a prescription to your pharmacy, to slow down the thyroid. if ever you have fever while taking methimazole, stop it and call us, because of the risk of a rare side-effect Please come back for a follow-up appointment in 1 month.      Hyperthyroidism The thyroid is a large gland located in the lower front part of your neck. The thyroid helps control metabolism. Metabolism is how your body uses food. It controls metabolism with the hormone thyroxine. When the thyroid is overactive, it produces too much hormone. When this happens, these following problems may occur:   Nervousness  Heat intolerance  Weight loss (in spite of increase food intake)  Diarrhea  Change in hair or skin texture  Palpitations (heart skipping or having extra beats)  Tachycardia (rapid heart rate)  Loss of menstruation (amenorrhea)  Shaking of the hands CAUSES  Grave's Disease (the immune system attacks the thyroid gland). This is the most common cause.  Inflammation of the thyroid gland.  Tumor (usually benign) in the thyroid gland or elsewhere.  Excessive use of thyroid medications (both prescription and 'natural').  Excessive ingestion of Iodine. DIAGNOSIS  To prove hyperthyroidism, your caregiver may do blood tests and ultrasound tests. Sometimes the signs are hidden. It may be necessary for your caregiver to watch this illness with blood tests, either before or after diagnosis and treatment. TREATMENT Short-term treatment There are several treatments to control symptoms. Drugs called beta blockers may give some relief. Drugs that decrease hormone production will provide temporary relief in many people. These measures will usually not give permanent relief. Definitive therapy There are treatments available which can be discussed between you and your caregiver which will permanently treat the problem. These treatments range from surgery (removal of the thyroid), to the use of radioactive  iodine (destroys the thyroid by radiation), to the use of antithyroid drugs (interfere with hormone synthesis). The first two treatments are permanent and usually successful. They most often require hormone replacement therapy for life. This is because it is impossible to remove or destroy the exact amount of thyroid required to make a person euthyroid (normal). HOME CARE INSTRUCTIONS  See your caregiver if the problems you are being treated for get worse. Examples of this would be the problems listed above. SEEK MEDICAL CARE IF: Your general condition worsens. MAKE SURE YOU:   Understand these instructions.  Will watch your condition.  Will get help right away if you are not doing well or get worse. Document Released: 12/22/2004 Document Revised: 03/16/2011 Document Reviewed: 05/05/2006 [facility] Patient Information 2015 [place], [place]. This information is not intended to replace advice given to you by your health care provider. Make sure you discuss any questions you have with your health care provider.

## 2014-09-17 ENCOUNTER — Other Ambulatory Visit (INDEPENDENT_AMBULATORY_CARE_PROVIDER_SITE_OTHER): Payer: Medicare Other | Admitting: *Deleted

## 2014-09-17 DIAGNOSIS — I480 Paroxysmal atrial fibrillation: Secondary | ICD-10-CM | POA: Diagnosis not present

## 2014-09-17 DIAGNOSIS — E039 Hypothyroidism, unspecified: Secondary | ICD-10-CM

## 2014-09-17 LAB — CBC WITH DIFFERENTIAL/PLATELET
BASOS PCT: 0.2 % (ref 0.0–3.0)
Basophils Absolute: 0 10*3/uL (ref 0.0–0.1)
EOS PCT: 11 % — AB (ref 0.0–5.0)
Eosinophils Absolute: 0.7 10*3/uL (ref 0.0–0.7)
HCT: 41.9 % (ref 36.0–46.0)
Hemoglobin: 13.4 g/dL (ref 12.0–15.0)
LYMPHS ABS: 2.1 10*3/uL (ref 0.7–4.0)
Lymphocytes Relative: 32.8 % (ref 12.0–46.0)
MCHC: 32 g/dL (ref 30.0–36.0)
MCV: 71.8 fl — ABNORMAL LOW (ref 78.0–100.0)
MONO ABS: 0.9 10*3/uL (ref 0.1–1.0)
Monocytes Relative: 13.5 % — ABNORMAL HIGH (ref 3.0–12.0)
NEUTROS ABS: 2.8 10*3/uL (ref 1.4–7.7)
NEUTROS PCT: 42.5 % — AB (ref 43.0–77.0)
PLATELETS: 234 10*3/uL (ref 150.0–400.0)
RBC: 5.83 Mil/uL — ABNORMAL HIGH (ref 3.87–5.11)
RDW: 23.5 % — AB (ref 11.5–15.5)
WBC: 6.5 10*3/uL (ref 4.0–10.5)

## 2014-09-17 LAB — BASIC METABOLIC PANEL
BUN: 23 mg/dL (ref 6–23)
CALCIUM: 9.7 mg/dL (ref 8.4–10.5)
CO2: 29 mEq/L (ref 19–32)
Chloride: 99 mEq/L (ref 96–112)
Creatinine, Ser: 1.01 mg/dL (ref 0.40–1.20)
GFR: 69.8 mL/min (ref >60.00)
Glucose, Bld: 126 mg/dL — ABNORMAL HIGH (ref 70–99)
Potassium: 3.8 mEq/L (ref 3.5–5.1)
SODIUM: 136 meq/L (ref 135–145)

## 2014-09-17 NOTE — Addendum Note (Signed)
Addended by: Tonita Phoenix on: 09/17/2014 08:59 AM   Modules accepted: Orders

## 2014-09-19 ENCOUNTER — Telehealth: Payer: Self-pay | Admitting: *Deleted

## 2014-09-19 DIAGNOSIS — I4891 Unspecified atrial fibrillation: Secondary | ICD-10-CM

## 2014-09-19 NOTE — Telephone Encounter (Signed)
Pt was in office for lab work 9/12, requested information about having her cardioversion.  Per hospital progress note on 08/25/14, pt scheduled for cardioversion, but currently she is not on the schedule. Routing to Dr. Eden Emms for further recommendation regarding cardioversion.

## 2014-09-19 NOTE — Telephone Encounter (Signed)
Last TSH still supressed.  Should wait until TSH normal if possible before [facility].  F/U TSH/T4 this week

## 2014-09-20 ENCOUNTER — Other Ambulatory Visit (INDEPENDENT_AMBULATORY_CARE_PROVIDER_SITE_OTHER): Payer: Medicare Other

## 2014-09-20 ENCOUNTER — Telehealth: Payer: Self-pay | Admitting: Cardiovascular Disease

## 2014-09-20 DIAGNOSIS — I4891 Unspecified atrial fibrillation: Secondary | ICD-10-CM | POA: Diagnosis not present

## 2014-09-20 NOTE — Telephone Encounter (Signed)
PT  AWARE  IS OKAY TO HAVE LABS DONE WAS STARTED ON  THYROID SUPPLEMENT LAST WEEK .Zack Seal

## 2014-09-20 NOTE — Telephone Encounter (Signed)
PT  AWARE./CY 

## 2014-09-20 NOTE — Telephone Encounter (Signed)
New message     Pt has a question regarding earlier conversation Please call to discuss

## 2014-09-21 ENCOUNTER — Other Ambulatory Visit: Payer: Medicare Other

## 2014-09-21 LAB — T4, FREE: FREE T4: 1.75 ng/dL — AB (ref 0.60–1.60)

## 2014-09-21 LAB — TSH: TSH: 0.08 u[IU]/mL — ABNORMAL LOW (ref 0.35–4.50)

## 2014-10-01 ENCOUNTER — Ambulatory Visit: Payer: Medicare Other | Admitting: Family

## 2014-10-02 ENCOUNTER — Encounter: Payer: Self-pay | Admitting: Endocrinology

## 2014-10-02 ENCOUNTER — Ambulatory Visit (INDEPENDENT_AMBULATORY_CARE_PROVIDER_SITE_OTHER): Payer: Medicare Other | Admitting: Endocrinology

## 2014-10-02 VITALS — BP 134/82 | HR 82 | Temp 98.1°F | Ht 60.0 in | Wt 188.0 lb

## 2014-10-02 DIAGNOSIS — E0580 Other thyrotoxicosis without thyrotoxic crisis or storm: Secondary | ICD-10-CM | POA: Diagnosis not present

## 2014-10-02 DIAGNOSIS — T462X5A Adverse effect of other antidysrhythmic drugs, initial encounter: Secondary | ICD-10-CM

## 2014-10-02 LAB — TSH: TSH: 0.23 u[IU]/mL — AB (ref 0.35–4.50)

## 2014-10-02 LAB — T4, FREE: Free T4: 1.36 ng/dL (ref 0.60–1.60)

## 2014-10-02 NOTE — Progress Notes (Signed)
Subjective:    Patient ID: Erin [name], female    DOB: 05/28/1945, 69 y.o.   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   Past Medical History  Diagnosis Date  . Hypertension   . Chronic systolic CHF (congestive heart failure)     a. echo 2014: EF 25%, diffuse HK, LA moderately dilated, RA mildly dilated, PASP 38 mm Hg; b. echo 08/2014: EF 25-30%, diffuse HK, RWMA cannot be excluded, mild MR, mild biatrial enlargement, RV mildly dilated, wall thickness nl, pacer wire/catheter noted, mild to mod TR, PASP high nl  . PAF (paroxysmal atrial fibrillation)     a. on warfarin--> Eliquis 08/2014  . Encounter for long-term (current) use of anticoagulants   . Automatic implantable cardioverter-defibrillator in situ     a. s/p MDT ICD 07/2012; b. SN # [id number]   . High cholesterol   . Anginal pain   . Exertional shortness of breath   . OSA on CPAP     "suppose to; not often" (07/22/2012)  . Other "heavy-for-dates" infants   . NICM (nonischemic cardiomyopathy)     a. s/p AICD 07/22/2012  . Coronary artery disease, non-occlusive     a. cath 2003: LM nl, mid LAD 50%, LCx nl, RCA nl b. L&RHC 08/17/2014 100% occluded mid LCx, 70% mid LAD with FFR 0.82. Medical therapy. CI 2.1. CO 4.01  . H/O medication noncompliance   . Obesity     Past Surgical History  Procedure Laterality Date  . Cardiac defibrillator placement  07/22/2012    dual-chamber ICD.  Marland Kitchen Abdominal hysterectomy  ~ 1998    "laparoscopic" (07/22/2012)  . Tubal ligation  ~ 1978  . Cardiac catheterization  ? 1990's  . Implantable cardioverter defibrillator implant N/A 07/22/2012    Procedure: IMPLANTABLE CARDIOVERTER  DEFIBRILLATOR IMPLANT;  Surgeon: Marinus Maw, MD;  Location: [facility] CATH LAB;  Service: Cardiovascular;  Laterality: N/A;  . Cardiac catheterization N/A 08/17/2014    Procedure: Right/Left Heart Cath and Coronary Angiography;  Surgeon: Kathleene Hazel, MD;  Location: [facility] INVASIVE CV LAB;  Service: Cardiovascular;  Laterality: N/A;  . Tee without cardioversion N/A 08/21/2014    Procedure: TRANSESOPHAGEAL ECHOCARDIOGRAM (TEE);  Surgeon: Chilton Si, MD;  Location: [facility] ENDOSCOPY;  Service: Cardiovascular;  Laterality: N/A;    Social History   Social History  . Marital Status: Single    Spouse Name: N/A  . Number of Children: 3  . Years of Education: N/A   Occupational History  . Retired [employer]   Social History Main Topics  . Smoking status: Never Smoker   . Smokeless tobacco: Never Used  . Alcohol Use: No  . Drug Use: No  . Sexual Activity: No   Other Topics Concern  . Not on file   Social History Narrative    Current Outpatient Prescriptions on File Prior to Visit  Medication Sig Dispense Refill  . amiodarone (PACERONE) 200 MG tablet Take 1 tablet (200 mg total) by mouth 2 (two) times daily. 60 tablet 3  . apixaban (ELIQUIS) 5 MG TABS tablet Take 1 tablet (5 mg total) by mouth 2 (two) times daily. 60 tablet 11  . carvedilol (  COREG) 6.25 MG tablet Take 1 tablet (6.25 mg total) by mouth 2 (two) times daily with a meal. 60 tablet 5  . furosemide (LASIX) 80 MG tablet Take 1 tablet (80 mg total) by mouth 2 (two) times daily. 60 tablet 3  . methimazole (TAPAZOLE) 10 MG tablet Take 1 tablet (10 mg total) by mouth 2 (two) times daily. 60 tablet 2  . potassium chloride 20 MEQ TBCR Take 20 mEq by mouth daily. 30 tablet 3  . spironolactone (ALDACTONE) 25 MG tablet Take 0.5 tablets (12.5 mg total) by mouth daily. 30 tablet 3   No current facility-administered medications on file prior to visit.    No Known Allergies  Family History  Problem Relation Age of Onset  .  Heart disease Mother   . Heart disease Father   . Lung disease Sister   . Diabetes Sister   . Heart failure Brother   . Thyroid disease Neg Hx     BP 134/82 mmHg  Pulse 82  Temp(Src) 98.1 F (36.7 C) (Oral)  Ht 5' (1.524 m)  Wt 188 lb (85.276 kg)  BMI 36.72 kg/m2  SpO2 96%   Review of Systems Denies fever.     Objective:   Physical Exam VITAL SIGNS:  See vs page.  GENERAL: no distress. Skin: not diaphoretic.  Neuro: no tremor.     Lab Results  Component Value Date   TSH 0.23* 10/02/2014      Assessment & Plan:  Hyperthyroidism, improved.  Patient is advised the following: Patient Instructions  blood tests are requested for you today.  We'll let you know about the results.   Please come back for a follow-up appointment in 1 month.   addendum: Please continue the same medication.

## 2014-10-02 NOTE — Patient Instructions (Addendum)
blood tests are requested for you today.  We'll let you know about the results.  Please come back for a follow-up appointment in 1 month.  

## 2014-10-03 ENCOUNTER — Telehealth: Payer: Self-pay | Admitting: Endocrinology

## 2014-10-03 NOTE — Telephone Encounter (Signed)
Patient is returning your call.  

## 2014-10-04 NOTE — Telephone Encounter (Signed)
I called the pt and advised of result note from 10/02/2014. Pt voiced understanding of results.

## 2014-10-09 ENCOUNTER — Encounter: Payer: Self-pay | Admitting: Family

## 2014-10-09 ENCOUNTER — Ambulatory Visit (INDEPENDENT_AMBULATORY_CARE_PROVIDER_SITE_OTHER): Payer: Medicare Other | Admitting: Internal Medicine

## 2014-10-09 ENCOUNTER — Ambulatory Visit: Payer: Medicare Other | Attending: Family | Admitting: Family

## 2014-10-09 ENCOUNTER — Encounter: Payer: Self-pay | Admitting: Internal Medicine

## 2014-10-09 VITALS — BP 127/62 | HR 75 | Ht 60.0 in | Wt 188.0 lb

## 2014-10-09 VITALS — BP 127/62 | HR 83 | Resp 20 | Ht 60.0 in | Wt 188.0 lb

## 2014-10-09 DIAGNOSIS — I480 Paroxysmal atrial fibrillation: Secondary | ICD-10-CM | POA: Diagnosis not present

## 2014-10-09 DIAGNOSIS — Z79899 Other long term (current) drug therapy: Secondary | ICD-10-CM | POA: Diagnosis not present

## 2014-10-09 DIAGNOSIS — G4739 Other sleep apnea: Secondary | ICD-10-CM

## 2014-10-09 DIAGNOSIS — Z7901 Long term (current) use of anticoagulants: Secondary | ICD-10-CM | POA: Diagnosis not present

## 2014-10-09 DIAGNOSIS — I4891 Unspecified atrial fibrillation: Secondary | ICD-10-CM | POA: Diagnosis not present

## 2014-10-09 DIAGNOSIS — I10 Essential (primary) hypertension: Secondary | ICD-10-CM

## 2014-10-09 DIAGNOSIS — I5022 Chronic systolic (congestive) heart failure: Secondary | ICD-10-CM

## 2014-10-09 DIAGNOSIS — G4731 Primary central sleep apnea: Secondary | ICD-10-CM

## 2014-10-09 DIAGNOSIS — I110 Hypertensive heart disease with heart failure: Secondary | ICD-10-CM | POA: Diagnosis not present

## 2014-10-09 DIAGNOSIS — Z6836 Body mass index (BMI) 36.0-36.9, adult: Secondary | ICD-10-CM | POA: Insufficient documentation

## 2014-10-09 DIAGNOSIS — E7800 Pure hypercholesterolemia, unspecified: Secondary | ICD-10-CM | POA: Insufficient documentation

## 2014-10-09 DIAGNOSIS — I429 Cardiomyopathy, unspecified: Secondary | ICD-10-CM | POA: Diagnosis not present

## 2014-10-09 DIAGNOSIS — I420 Dilated cardiomyopathy: Secondary | ICD-10-CM

## 2014-10-09 DIAGNOSIS — I2510 Atherosclerotic heart disease of native coronary artery without angina pectoris: Secondary | ICD-10-CM | POA: Insufficient documentation

## 2014-10-09 DIAGNOSIS — E669 Obesity, unspecified: Secondary | ICD-10-CM | POA: Diagnosis not present

## 2014-10-09 DIAGNOSIS — G4733 Obstructive sleep apnea (adult) (pediatric): Secondary | ICD-10-CM | POA: Diagnosis not present

## 2014-10-09 LAB — CUP PACEART INCLINIC DEVICE CHECK
Brady Statistic AP VP Percent: 0.1 % — CL
Brady Statistic AS VP Percent: 0.1 % — CL
HighPow Impedance: 73 Ohm
Lead Channel Impedance Value: 513 Ohm
Lead Channel Pacing Threshold Amplitude: 0.5 V
Lead Channel Pacing Threshold Amplitude: 0.875 V
Lead Channel Pacing Threshold Pulse Width: 0.4 ms
Lead Channel Pacing Threshold Pulse Width: 0.4 ms
Lead Channel Setting Pacing Amplitude: 2.5 V
Lead Channel Setting Pacing Pulse Width: 0.4 ms
Lead Channel Setting Sensing Sensitivity: 0.3 mV
MDC IDC MSMT LEADCHNL RA SENSING INTR AMPL: 4.8 mV
MDC IDC MSMT LEADCHNL RV IMPEDANCE VALUE: 437 Ohm
MDC IDC MSMT LEADCHNL RV SENSING INTR AMPL: 17.1 mV
MDC IDC SESS DTM: 20161004112030
MDC IDC SET LEADCHNL RA PACING AMPLITUDE: 2 V
MDC IDC SET ZONE DETECTION INTERVAL: 360 ms
MDC IDC STAT BRADY AP VS PERCENT: 0.1 % — AB
MDC IDC STAT BRADY AS VS PERCENT: 99.9 %
Zone Setting Detection Interval: 300 ms
Zone Setting Detection Interval: 350 ms
Zone Setting Detection Interval: 360 ms

## 2014-10-09 MED ORDER — SACUBITRIL-VALSARTAN 24-26 MG PO TABS: 1.0000 | ORAL_TABLET | Freq: Two times a day (BID) | ORAL | Status: DC

## 2014-10-09 NOTE — Progress Notes (Signed)
Patient Care Team: Romero Belling, MD as PCP - General (Endocrinology) Delma Freeze, FNP as Nurse Practitioner (Cardiology) Duke Salvia, MD as Consulting Physician (Cardiology) Wendall Stade, MD as Consulting Physician (Cardiology)   HPI  Erin [name] is a 69 y.o. female Seen in followup for ICD implantation the context of a nonischemic cardiomyopathy.echocardiogram 5/14 EF 25%  She has chronic systolic heart failure in the context of atrial fibrillation-paroxysmal. She takes amiodarone and warfarin.  At our last visit we discussed the role of NOACs admitting his amiodarone 7--5 days  surveillance laboratories 8/16 transaminases.   She was hospitalized 8/16 having presented with acute congestive heart failure in the context of atrial fibrillation with rapid rate.. Echocardiogram demonstrated an ejection fraction 25%.  Left atrial enlargement was noted (44/2.1/35)   She was transferred to [place] where she underwent catheterization demonstrated total occlusion of her mid circumflex a moderate stenosis in the LAD with negative FFR. TE cardioversion was anticipated as her INR has been subtherapeutic. TEE unfortunately demonstrated left atrial thrombus. She also diagnosed with hyperthyroidism; she was seen at some point by Dr. Everardo All and started on methimazole. Laboratories at improved with a T4 most recently in the normal range.   She has reverted to sinus rhythm. She feels terrific. She is able to climb a flight of stairs. She has had no edema.  During the hospitalization warfarin was transitioned to apixaban; hypotension had also prompted decrease in her carvedilol dose and the discontinuation of her ACE inhibitor.  Past Medical History  Diagnosis Date  . Hypertension   . Chronic systolic CHF (congestive heart failure) (HCC)     a. echo 2014: EF 25%, diffuse HK, LA moderately dilated, RA mildly dilated, PASP 38 mm Hg; b. echo 08/2014: EF 25-30%, diffuse HK, RWMA  cannot be excluded, mild MR, mild biatrial enlargement, RV mildly dilated, wall thickness nl, pacer wire/catheter noted, mild to mod TR, PASP high nl  . PAF (paroxysmal atrial fibrillation) (HCC)     a. on warfarin--> Eliquis 08/2014  . Encounter for long-term (current) use of anticoagulants   . Automatic implantable cardioverter-defibrillator in situ     a. s/p MDT ICD 07/2012; b. SN # [id number]   . High cholesterol   . Anginal pain (HCC)   . Exertional shortness of breath   . OSA on CPAP     "suppose to; not often" (07/22/2012)  . Other "heavy-for-dates" infants   . NICM (nonischemic cardiomyopathy) (HCC)     a. s/p AICD 07/22/2012  . Coronary artery disease, non-occlusive     a. cath 2003: LM nl, mid LAD 50%, LCx nl, RCA nl b. L&RHC 08/17/2014 100% occluded mid LCx, 70% mid LAD with FFR 0.82. Medical therapy. CI 2.1. CO 4.01  . H/O medication noncompliance   . Obesity     Past Surgical History  Procedure Laterality Date  . Cardiac defibrillator placement  07/22/2012    dual-chamber ICD.  Marland Kitchen Abdominal hysterectomy  ~ 1998    "laparoscopic" (07/22/2012)  . Tubal ligation  ~ 1978  . Cardiac catheterization  ? 1990's  . Implantable cardioverter defibrillator implant N/A 07/22/2012    Procedure: IMPLANTABLE CARDIOVERTER DEFIBRILLATOR IMPLANT;  Surgeon: Marinus Maw, MD;  Location: [facility] CATH LAB;  Service: Cardiovascular;  Laterality: N/A;  . Cardiac catheterization N/A 08/17/2014    Procedure: Right/Left Heart Cath and Coronary Angiography;  Surgeon: Kathleene Hazel, MD;  Location: [facility] INVASIVE CV LAB;  Service:  Cardiovascular;  Laterality: N/A;  . Tee without cardioversion N/A 08/21/2014    Procedure: TRANSESOPHAGEAL ECHOCARDIOGRAM (TEE);  Surgeon: Chilton Si, MD;  Location: [facility] ENDOSCOPY;  Service: Cardiovascular;  Laterality: N/A;    Current Outpatient Prescriptions  Medication Sig Dispense Refill  . amiodarone (PACERONE) 200 MG tablet Take 1 tablet (200 mg total) by mouth 2  (two) times daily. 60 tablet 3  . apixaban (ELIQUIS) 5 MG TABS tablet Take 1 tablet (5 mg total) by mouth 2 (two) times daily. 60 tablet 11  . carvedilol (COREG) 6.25 MG tablet Take 1 tablet (6.25 mg total) by mouth 2 (two) times daily with a meal. 60 tablet 5  . furosemide (LASIX) 80 MG tablet Take 1 tablet (80 mg total) by mouth 2 (two) times daily. 60 tablet 3  . methimazole (TAPAZOLE) 10 MG tablet Take 1 tablet (10 mg total) by mouth 2 (two) times daily. 60 tablet 2  . potassium chloride 20 MEQ TBCR Take 20 mEq by mouth daily. 30 tablet 3  . sacubitril-valsartan (ENTRESTO) 24-26 MG Take 1 tablet by mouth 2 (two) times daily. 60 tablet 0  . spironolactone (ALDACTONE) 25 MG tablet Take 0.5 tablets (12.5 mg total) by mouth daily. 30 tablet 3   No current facility-administered medications for this visit.    No Known Allergies  Review of Systems negative except from HPI and PMH  Physical Exam BP 127/62 mmHg  Pulse 75  Ht 5' (1.524 m)  Wt 188 lb (85.276 kg)  BMI 36.72 kg/m2 Well developed and well nourished morbidly obese in no acute distress HENT normal E scleral and icterus clear Neck Supple JVP flat; carotids brisk and full Clear to ausculation  egular rate and rhythm, no murmurs gallops or rub Soft with active bowel sounds No clubbing cyanosis   Edema Alert and oriented, grossly normal motor and sensory function Skin Warm and Dry  ECG demonstrates sinus rhythm at 79 Intervals [birth year]   Assessment and  Plan  Paroxysmal atrial fibrillation  Nonischemic cardiomyopathy  Renal insufficiency grade 3 one year ago  Congestive heart failure-chronic-systolic   She is euvolemic and currently class II. She's had no interval atrial fibrillations and spontaneous conversion about 5 or 6 weeks ago. We will decrease her amiodarone from 400--200 mg daily.  We will recheck an echocardiogram to look at left atrial dimension; given her youth, 44, I would be inclined to pursue catheter  ablation to help her avoid both amiodarone as well as its consequences in this case hyperthyroidism  Renal function is stable

## 2014-10-09 NOTE — Progress Notes (Signed)
Subjective:    Patient ID: Erin [name], female    DOB: [birth year], 69 y.o.   MRN: [id number]  Congestive Heart Failure Presents for follow-up visit. The disease course has been improving. Associated symptoms include fatigue. Pertinent negatives include no abdominal pain, chest pain, chest pressure, edema, palpitations or shortness of breath. The symptoms have been improving. Past treatments include salt and fluid restriction, beta blockers and aldosterone receptor blockers. The treatment provided moderate relief. Compliance with prior treatments has been good. Her past medical history is significant for arrhythmia, CAD and HTN. She has multiple 1st degree relatives with heart disease. Compliance with total regimen is 76-100%.  Hypertension This is a chronic problem. The current episode started more than 1 year ago. The problem has been resolved since onset. The problem is controlled. Associated symptoms include malaise/fatigue. Pertinent negatives include no chest pain, headaches, neck pain, palpitations, peripheral edema or shortness of breath. There are no associated agents to hypertension. Risk factors for coronary artery disease include dyslipidemia and obesity. Past treatments include beta blockers, diuretics and lifestyle changes. The current treatment provides moderate improvement. Hypertensive end-organ damage includes heart failure.   Past Medical History  Diagnosis Date  . Hypertension   . Chronic systolic CHF (congestive heart failure) (HCC)     a. echo 2014: EF 25%, diffuse HK, LA moderately dilated, RA mildly dilated, PASP 38 mm Hg; b. echo 08/2014: EF 25-30%, diffuse HK, RWMA cannot be excluded, mild MR, mild biatrial enlargement, RV mildly dilated, wall thickness nl, pacer wire/catheter noted, mild to mod TR, PASP high nl  . PAF (paroxysmal atrial fibrillation) (HCC)     a. on warfarin--> Eliquis 08/2014  . Encounter for long-term (current) use of anticoagulants   . Automatic  implantable cardioverter-defibrillator in situ     a. s/p MDT ICD 07/2012; b. SN # [id number]   . High cholesterol   . Anginal pain (HCC)   . Exertional shortness of breath   . OSA on CPAP     "suppose to; not often" (07/22/2012)  . Other "heavy-for-dates" infants   . NICM (nonischemic cardiomyopathy) (HCC)     a. s/p AICD 07/22/2012  . Coronary artery disease, non-occlusive     a. cath 2003: LM nl, mid LAD 50%, LCx nl, RCA nl b. L&RHC 08/17/2014 100% occluded mid LCx, 70% mid LAD with FFR 0.82. Medical therapy. CI 2.1. CO 4.01  . H/O medication noncompliance   . Obesity     Past Surgical History  Procedure Laterality Date  . Cardiac defibrillator placement  07/22/2012    dual-chamber ICD.  Marland Kitchen Abdominal hysterectomy  ~ 1998    "laparoscopic" (07/22/2012)  . Tubal ligation  ~ 1978  . Cardiac catheterization  ? 1990's  . Implantable cardioverter defibrillator implant N/A 07/22/2012    Procedure: IMPLANTABLE CARDIOVERTER DEFIBRILLATOR IMPLANT;  Surgeon: Marinus Maw, MD;  Location: [facility] CATH LAB;  Service: Cardiovascular;  Laterality: N/A;  . Cardiac catheterization N/A 08/17/2014    Procedure: Right/Left Heart Cath and Coronary Angiography;  Surgeon: Kathleene Hazel, MD;  Location: [facility] INVASIVE CV LAB;  Service: Cardiovascular;  Laterality: N/A;  . Tee without cardioversion N/A 08/21/2014    Procedure: TRANSESOPHAGEAL ECHOCARDIOGRAM (TEE);  Surgeon: Chilton Si, MD;  Location: [facility] ENDOSCOPY;  Service: Cardiovascular;  Laterality: N/A;    Family History  Problem Relation Age of Onset  . Heart disease Mother   . Heart disease Father   . Lung disease Sister   . Diabetes Sister   .  Heart failure Brother   . Thyroid disease Neg Hx     Social History  Substance Use Topics  . Smoking status: Never Smoker   . Smokeless tobacco: Never Used  . Alcohol Use: No    No Known Allergies  Prior to Admission medications   Medication Sig Start Date End Date Taking? Authorizing  Provider  amiodarone (PACERONE) 200 MG tablet Take 1 tablet (200 mg total) by mouth 2 (two) times daily. 08/22/14  Yes Azalee Course, PA  apixaban (ELIQUIS) 5 MG TABS tablet Take 1 tablet (5 mg total) by mouth 2 (two) times daily. 08/22/14  Yes Azalee Course, PA  carvedilol (COREG) 6.25 MG tablet Take 1 tablet (6.25 mg total) by mouth 2 (two) times daily with a meal. 08/22/14  Yes Azalee Course, PA  furosemide (LASIX) 80 MG tablet Take 1 tablet (80 mg total) by mouth 2 (two) times daily. 08/22/14  Yes Azalee Course, PA  methimazole (TAPAZOLE) 10 MG tablet Take 1 tablet (10 mg total) by mouth 2 (two) times daily. 09/11/14  Yes Romero Belling, MD  potassium chloride 20 MEQ TBCR Take 20 mEq by mouth daily. 08/22/14  Yes Azalee Course, PA  spironolactone (ALDACTONE) 25 MG tablet Take 0.5 tablets (12.5 mg total) by mouth daily. 08/22/14  Yes Azalee Course, PA  sacubitril-valsartan (ENTRESTO) 24-26 MG Take 1 tablet by mouth 2 (two) times daily. 10/09/14   Delma Freeze, FNP      Review of Systems  Constitutional: Positive for malaise/fatigue and fatigue. Negative for appetite change.  HENT: Negative for congestion, postnasal drip and sore throat.   Eyes: Negative.   Respiratory: Negative for cough, chest tightness and shortness of breath.   Cardiovascular: Negative for chest pain, palpitations and leg swelling.  Gastrointestinal: Negative for abdominal pain and abdominal distention.  Endocrine: Negative.   Genitourinary: Negative.   Musculoskeletal: Negative for back pain and neck pain.  Skin: Negative.   Allergic/Immunologic: Negative.   Neurological: Negative for dizziness, light-headedness and headaches.  Hematological: Negative for adenopathy. Does not bruise/bleed easily.  Psychiatric/Behavioral: Negative for sleep disturbance (using CPAP 4-5 hours/night. sleeping on 1 pillows).       Objective:   Physical Exam  Constitutional: She is oriented to person, place, and time. She appears well-developed and well-nourished.   HENT:  Head: Normocephalic and atraumatic.  Eyes: Conjunctivae are normal. Pupils are equal, round, and reactive to light.  Neck: Normal range of motion. Neck supple.  Cardiovascular: Normal rate and regular rhythm.   Pulmonary/Chest: Effort normal. She has no wheezes. She has no rales.  Abdominal: Soft. She exhibits no distension. There is no tenderness.  Musculoskeletal: She exhibits no edema or tenderness.  Neurological: She is alert and oriented to person, place, and time.  Skin: Skin is warm and dry.  Psychiatric: She has a normal mood and affect. Her behavior is normal. Thought content normal.  Nursing note and vitals reviewed.   BP 127/62 mmHg  Pulse 83  Resp 20  Ht 5' (1.524 m)  Wt 188 lb (85.276 kg)  BMI 36.72 kg/m2  SpO2 99%       Assessment & Plan:  1: Chronic heart failure with reduced ejection fraction- Patient presents with some fatigue upon exertion but reports it as mild in nature. She continues to weigh herself at home and says that her home weight has been stable. Reminded to call for an overnight weight gain of >2 pounds or a weekly weight gain of >5 pounds. She says that  her appetite is good and that she continues to not add salt to her food and tries to follow a low sodium diet. Will begin entresto 24/26mg  twice daily and a 30 day voucher card was given to patient. Will plan on checking a chemistry panel at her next visit as she is already on potassium and spironolactone. Last potassium was 3.8. If she has any side effects from taking the medication, she is to give [place] a call. 2: HTN- Blood pressure looks great today. Beginning entresto per above. 3: Sleep apnea- She says that she does wear her CPAP nightly but says that she probably only wears it 4-5 hours a night as she tends to take it off during the middle of the night. Discussed the importance of wearing it the entire time that she is asleep. She denies taking any naps during the day.   Return in 2 weeks or  sooner for any questions/problems before then.

## 2014-10-09 NOTE — Patient Instructions (Signed)
Medication Instructions: 1) Decrease amiodarone (pacerone) to 200 mg one tablet by mouth once daily  Labwork: - none  Procedures/Testing: - Your physician has requested that you have an echocardiogram. Echocardiography is a painless test that uses sound waves to create images of your heart. It provides your doctor with information about the size and shape of your heart and how well your heart's chambers and valves are working. This procedure takes approximately one hour. There are no restrictions for this procedure.  Follow-Up: - Remote monitoring is used to monitor your Pacemaker of ICD from home. This monitoring reduces the number of office visits required to check your device to one time per year. It allows [place] to keep an eye on the functioning of your device to ensure it is working properly. You are scheduled for a device check from home on 01/08/15. You may send your transmission at any time that day. If you have a wireless device, the transmission will be sent automatically. After your physician reviews your transmission, you will receive a postcard with your next transmission date.  - Your physician wants you to follow-up in: 6 months with Dr. Graciela Husbands. You will receive a reminder letter in the mail two months in advance. If you don't receive a letter, please call our office to schedule the follow-up appointment.  Any Additional Special Instructions Will Be Listed Below (If Applicable).

## 2014-10-09 NOTE — Patient Instructions (Addendum)
Continue weighing daily and call for an overnight weight gain of > 2 pounds or a weekly weight gain of >5 pounds.  Begin Entresto 24/26mg  twice daily. Take 30 day free voucher to pharmacy to get medication filled. Will have you return in 2 weeks and check lab work at that time.

## 2014-10-10 ENCOUNTER — Telehealth: Payer: Self-pay | Admitting: Cardiovascular Disease

## 2014-10-10 NOTE — Telephone Encounter (Signed)
New problem   Pt need pt speak to your concerning some medications that need prior auth. Pt hung up before I could get name of medication. Please call pt.

## 2014-10-11 ENCOUNTER — Other Ambulatory Visit: Payer: Self-pay | Admitting: Family

## 2014-10-11 ENCOUNTER — Ambulatory Visit: Payer: Medicare Other | Admitting: Endocrinology

## 2014-10-11 NOTE — Telephone Encounter (Signed)
Patient states she was Rx'd Eliquis in the hospital. Has gotten her free 30 day supply now needs a PA. I asked her to notify her pharmacy to sent it to [place]. She will do this.

## 2014-10-19 ENCOUNTER — Other Ambulatory Visit: Payer: Self-pay

## 2014-10-19 MED ORDER — POTASSIUM CHLORIDE ER 20 MEQ PO TBCR: 20.0000 meq | EXTENDED_RELEASE_TABLET | Freq: Every day | ORAL | Status: DC

## 2014-10-22 ENCOUNTER — Telehealth: Payer: Self-pay

## 2014-10-22 NOTE — Telephone Encounter (Signed)
Prior auth for Eliquis 5mg   Sent to [employer]

## 2014-10-23 NOTE — Telephone Encounter (Signed)
Eliquis 5 mg approved. [id number]. Good through 10/22/2015.

## 2014-10-25 ENCOUNTER — Encounter (HOSPITAL_COMMUNITY): Admission: RE | Payer: Self-pay | Source: Ambulatory Visit

## 2014-10-25 ENCOUNTER — Ambulatory Visit (HOSPITAL_COMMUNITY): Admission: RE | Admit: 2014-10-25 | Payer: Medicare Other | Source: Ambulatory Visit | Admitting: Cardiovascular Disease

## 2014-10-25 SURGERY — CARDIOVERSION
Anesthesia: Monitor Anesthesia Care

## 2014-10-26 ENCOUNTER — Other Ambulatory Visit: Payer: Self-pay

## 2014-10-26 ENCOUNTER — Ambulatory Visit (INDEPENDENT_AMBULATORY_CARE_PROVIDER_SITE_OTHER): Payer: Medicare Other

## 2014-10-26 DIAGNOSIS — I4891 Unspecified atrial fibrillation: Secondary | ICD-10-CM | POA: Diagnosis not present

## 2014-10-29 ENCOUNTER — Ambulatory Visit: Payer: Medicare Other | Admitting: Family

## 2014-10-30 ENCOUNTER — Encounter: Payer: Self-pay | Admitting: Internal Medicine

## 2014-11-01 ENCOUNTER — Telehealth: Payer: Self-pay | Admitting: Cardiovascular Disease

## 2014-11-01 ENCOUNTER — Ambulatory Visit (INDEPENDENT_AMBULATORY_CARE_PROVIDER_SITE_OTHER): Payer: Medicare Other | Admitting: Endocrinology

## 2014-11-01 ENCOUNTER — Encounter: Payer: Self-pay | Admitting: Endocrinology

## 2014-11-01 VITALS — BP 122/70 | HR 87 | Temp 98.2°F | Ht 60.0 in | Wt 194.0 lb

## 2014-11-01 DIAGNOSIS — T462X5A Adverse effect of other antidysrhythmic drugs, initial encounter: Secondary | ICD-10-CM

## 2014-11-01 DIAGNOSIS — E0580 Other thyrotoxicosis without thyrotoxic crisis or storm: Secondary | ICD-10-CM | POA: Diagnosis not present

## 2014-11-01 LAB — T4, FREE: FREE T4: 0.95 ng/dL (ref 0.60–1.60)

## 2014-11-01 LAB — TSH: TSH: 0.28 u[IU]/mL — ABNORMAL LOW (ref 0.35–4.50)

## 2014-11-01 NOTE — Progress Notes (Signed)
Subjective:    Patient ID: Erin [name], female    DOB: 08/23/1945, 69 y.o.   MRN: [id number]  HPI Pt returns for f/u of hyperthyroidism (dx'ed whlie on amiodarone--she has taken since 2014; in 2015, she had slightly elevated TSH; several measurements more recently were low; she has never had thyroid imaging; in Sept of 2016, she was rx'ed tapazole).  Since on the tapazole, pt states she feels no different, and well in general.   Past Medical History  Diagnosis Date  . Hypertension   . Chronic systolic CHF (congestive heart failure) (HCC)     a. echo 2014: EF 25%, diffuse HK, LA moderately dilated, RA mildly dilated, PASP 38 mm Hg; b. echo 08/2014: EF 25-30%, diffuse HK, RWMA cannot be excluded, mild MR, mild biatrial enlargement, RV mildly dilated, wall thickness nl, pacer wire/catheter noted, mild to mod TR, PASP high nl  . PAF (paroxysmal atrial fibrillation) (HCC)     a. on warfarin--> Eliquis 08/2014  . Encounter for long-term (current) use of anticoagulants   . Automatic implantable cardioverter-defibrillator in situ     a. s/p MDT ICD 07/2012; b. SN # [id number]   . High cholesterol   . Anginal pain (HCC)   . Exertional shortness of breath   . OSA on CPAP     "suppose to; not often" (07/22/2012)  . Other "heavy-for-dates" infants   . NICM (nonischemic cardiomyopathy) (HCC)     a. s/p AICD 07/22/2012  . Coronary artery disease, non-occlusive     a. cath 2003: LM nl, mid LAD 50%, LCx nl, RCA nl b. L&RHC 08/17/2014 100% occluded mid LCx, 70% mid LAD with FFR 0.82. Medical therapy. CI 2.1. CO 4.01  . H/O medication noncompliance   . Obesity     Past Surgical History  Procedure Laterality Date  . Cardiac defibrillator placement  07/22/2012    dual-chamber ICD.  Marland Kitchen Abdominal hysterectomy  ~ 1998    "laparoscopic" (07/22/2012)  . Tubal ligation  ~ 1978  . Cardiac catheterization  ? 1990's  . Implantable cardioverter defibrillator implant N/A 07/22/2012    Procedure: IMPLANTABLE  CARDIOVERTER DEFIBRILLATOR IMPLANT;  Surgeon: Marinus Maw, MD;  Location: [facility] CATH LAB;  Service: Cardiovascular;  Laterality: N/A;  . Cardiac catheterization N/A 08/17/2014    Procedure: Right/Left Heart Cath and Coronary Angiography;  Surgeon: Kathleene Hazel, MD;  Location: [facility] INVASIVE CV LAB;  Service: Cardiovascular;  Laterality: N/A;  . Tee without cardioversion N/A 08/21/2014    Procedure: TRANSESOPHAGEAL ECHOCARDIOGRAM (TEE);  Surgeon: Chilton Si, MD;  Location: [facility] ENDOSCOPY;  Service: Cardiovascular;  Laterality: N/A;    Social History   Social History  . Marital Status: Single    Spouse Name: N/A  . Number of Children: 3  . Years of Education: N/A   Occupational History  . Retired [employer]   Social History Main Topics  . Smoking status: Never Smoker   . Smokeless tobacco: Never Used  . Alcohol Use: No  . Drug Use: No  . Sexual Activity: No   Other Topics Concern  . Not on file   Social History Narrative    Current Outpatient Prescriptions on File Prior to Visit  Medication Sig Dispense Refill  . amiodarone (PACERONE) 200 MG tablet Take 1 tablet (200 mg total) by mouth daily.    Marland Kitchen apixaban (ELIQUIS) 5 MG TABS tablet Take 1 tablet (5 mg total) by mouth 2 (two) times daily. 60 tablet 11  . carvedilol (  COREG) 6.25 MG tablet Take 1 tablet (6.25 mg total) by mouth 2 (two) times daily with a meal. 60 tablet 5  . ENTRESTO 24-26 MG TAKE ONE TABLET BY MOUTH TWICE DAILY 60 tablet 0  . furosemide (LASIX) 80 MG tablet Take 1 tablet (80 mg total) by mouth 2 (two) times daily. 60 tablet 3  . methimazole (TAPAZOLE) 10 MG tablet Take 1 tablet (10 mg total) by mouth 2 (two) times daily. 60 tablet 2  . Potassium Chloride ER 20 MEQ TBCR Take 20 mEq by mouth daily. 30 tablet 3  . spironolactone (ALDACTONE) 25 MG tablet Take 0.5 tablets (12.5 mg total) by mouth daily. 30 tablet 3   No current facility-administered medications on file prior to visit.    No Known  Allergies  Family History  Problem Relation Age of Onset  . Heart disease Mother   . Heart disease Father   . Lung disease Sister   . Diabetes Sister   . Heart failure Brother   . Thyroid disease Neg Hx     BP 122/70 mmHg  Pulse 87  Temp(Src) 98.2 F (36.8 C) (Oral)  Ht 5' (1.524 m)  Wt 194 lb (87.998 kg)  BMI 37.89 kg/m2  SpO2 93%  Review of Systems Denies fever    Objective:   Physical Exam VITAL SIGNS:  See vs page.   GENERAL: no distress.   NECK: There is no palpable thyroid enlargement.  No thyroid nodule is palpable.  No palpable lymphadenopathy at the anterior neck.     Lab Results  Component Value Date   TSH 0.28* 11/01/2014      Assessment & Plan:  Hyperthyroidism: continues to improve.  Patient is advised the following: Patient Instructions  blood tests are requested for you today.  We'll let you know about the results.   Please come back for a follow-up appointment in 3 months.   addendum:  Please continue the same medication

## 2014-11-01 NOTE — Telephone Encounter (Signed)
Walk in pt form-Entresto [employer] forms dropped off gave to [place]

## 2014-11-01 NOTE — Patient Instructions (Addendum)
blood tests are requested for you today.  We'll let you know about the results.   Please come back for a follow-up appointment in 3 months.   

## 2014-11-08 ENCOUNTER — Ambulatory Visit: Payer: Medicare Other | Admitting: Family

## 2014-11-12 ENCOUNTER — Telehealth: Payer: Self-pay | Admitting: Cardiovascular Disease

## 2014-11-12 ENCOUNTER — Other Ambulatory Visit: Payer: Self-pay

## 2014-11-12 MED ORDER — APIXABAN 5 MG PO TABS: 5.0000 mg | ORAL_TABLET | Freq: Two times a day (BID) | ORAL | Status: DC

## 2014-11-12 NOTE — Telephone Encounter (Signed)
New message      Calling to get an update on a form pt dropped off.  The form is to get assistance with medication.  Did you get it?  Has it been completed?

## 2014-11-13 NOTE — Telephone Encounter (Signed)
Spoke with patient today. Advised forms for Patient Assistance are ready. She wanted [place] to send them to [facility]. I advised her that her financial info and W-2 was not filled out. 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 She then asked that I mail it to her, which I did.

## 2014-11-14 ENCOUNTER — Encounter: Payer: Self-pay | Admitting: Family

## 2014-11-14 ENCOUNTER — Ambulatory Visit: Payer: Medicare Other | Attending: Family | Admitting: Family

## 2014-11-14 VITALS — BP 101/42 | HR 72 | Resp 20 | Ht 60.0 in | Wt 193.0 lb

## 2014-11-14 DIAGNOSIS — G4733 Obstructive sleep apnea (adult) (pediatric): Secondary | ICD-10-CM | POA: Insufficient documentation

## 2014-11-14 DIAGNOSIS — I10 Essential (primary) hypertension: Secondary | ICD-10-CM | POA: Insufficient documentation

## 2014-11-14 DIAGNOSIS — Z7901 Long term (current) use of anticoagulants: Secondary | ICD-10-CM | POA: Diagnosis not present

## 2014-11-14 DIAGNOSIS — E669 Obesity, unspecified: Secondary | ICD-10-CM | POA: Insufficient documentation

## 2014-11-14 DIAGNOSIS — I480 Paroxysmal atrial fibrillation: Secondary | ICD-10-CM | POA: Insufficient documentation

## 2014-11-14 DIAGNOSIS — I2510 Atherosclerotic heart disease of native coronary artery without angina pectoris: Secondary | ICD-10-CM | POA: Diagnosis not present

## 2014-11-14 DIAGNOSIS — E7800 Pure hypercholesterolemia, unspecified: Secondary | ICD-10-CM | POA: Insufficient documentation

## 2014-11-14 DIAGNOSIS — Z79899 Other long term (current) drug therapy: Secondary | ICD-10-CM | POA: Insufficient documentation

## 2014-11-14 DIAGNOSIS — I4891 Unspecified atrial fibrillation: Secondary | ICD-10-CM | POA: Insufficient documentation

## 2014-11-14 DIAGNOSIS — I429 Cardiomyopathy, unspecified: Secondary | ICD-10-CM | POA: Diagnosis not present

## 2014-11-14 DIAGNOSIS — I5022 Chronic systolic (congestive) heart failure: Secondary | ICD-10-CM | POA: Diagnosis present

## 2014-11-14 LAB — BASIC METABOLIC PANEL
Anion gap: 8 (ref 5–15)
BUN: 27 mg/dL — AB (ref 6–20)
CALCIUM: 9.4 mg/dL (ref 8.9–10.3)
CO2: 28 mmol/L (ref 22–32)
CREATININE: 1.15 mg/dL — AB (ref 0.44–1.00)
Chloride: 101 mmol/L (ref 101–111)
GFR calc non Af Amer: 47 mL/min — ABNORMAL LOW (ref >60)
GFR, EST AFRICAN AMERICAN: 55 mL/min — AB (ref >60)
Glucose, Bld: 104 mg/dL — ABNORMAL HIGH (ref 65–99)
Potassium: 4 mmol/L (ref 3.5–5.1)
SODIUM: 137 mmol/L (ref 135–145)

## 2014-11-14 NOTE — Patient Instructions (Signed)
Continue weighing daily and call for an overnight weight gain of > 2 pounds or a weekly weight gain of >5 pounds. 

## 2014-11-14 NOTE — Progress Notes (Signed)
Subjective:    Patient ID: Erin [name], female    DOB: 03-30-1945, 69 y.o.   MRN: [id number]  Congestive Heart Failure Presents for follow-up visit. The disease course has been stable. Associated symptoms include fatigue. Pertinent negatives include no abdominal pain, chest pain, chest pressure, edema, orthopnea, palpitations, shortness of breath or unexpected weight change. The symptoms have been stable. Past treatments include beta blockers, angiotensin receptor blockers, aldosterone receptor blockers and salt and fluid restriction. The treatment provided moderate relief. Compliance with prior treatments has been good. Her past medical history is significant for arrhythmia, CAD and HTN. She has multiple 1st degree relatives with heart disease. Compliance with total regimen is 76-100%.  Hypertension This is a chronic problem. The current episode started more than 1 year ago. The problem has been resolved since onset. The problem is controlled. Pertinent negatives include no blurred vision, chest pain, headaches, neck pain, palpitations or shortness of breath. There are no associated agents to hypertension. Risk factors for coronary artery disease include dyslipidemia and obesity. Past treatments include ACE inhibitors, beta blockers, diuretics and lifestyle changes. The current treatment provides moderate improvement. Hypertensive end-organ damage includes heart failure.    Past Medical History  Diagnosis Date  . Hypertension   . Chronic systolic CHF (congestive heart failure) (HCC)     a. echo 2014: EF 25%, diffuse HK, LA moderately dilated, RA mildly dilated, PASP 38 mm Hg; b. echo 08/2014: EF 25-30%, diffuse HK, RWMA cannot be excluded, mild MR, mild biatrial enlargement, RV mildly dilated, wall thickness nl, pacer wire/catheter noted, mild to mod TR, PASP high nl  . PAF (paroxysmal atrial fibrillation) (HCC)     a. on warfarin--> Eliquis 08/2014  . Encounter for long-term (current) use of  anticoagulants   . Automatic implantable cardioverter-defibrillator in situ     a. s/p MDT ICD 07/2012; b. SN # [id number]   . High cholesterol   . Anginal pain (HCC)   . Exertional shortness of breath   . OSA on CPAP     "suppose to; not often" (07/22/2012)  . Other "heavy-for-dates" infants   . NICM (nonischemic cardiomyopathy) (HCC)     a. s/p AICD 07/22/2012  . Coronary artery disease, non-occlusive     a. cath 2003: LM nl, mid LAD 50%, LCx nl, RCA nl b. L&RHC 08/17/2014 100% occluded mid LCx, 70% mid LAD with FFR 0.82. Medical therapy. CI 2.1. CO 4.01  . H/O medication noncompliance   . Obesity     Past Surgical History  Procedure Laterality Date  . Cardiac defibrillator placement  07/22/2012    dual-chamber ICD.  Marland Kitchen Abdominal hysterectomy  ~ 1998    "laparoscopic" (07/22/2012)  . Tubal ligation  ~ 1978  . Cardiac catheterization  ? 1990's  . Implantable cardioverter defibrillator implant N/A 07/22/2012    Procedure: IMPLANTABLE CARDIOVERTER DEFIBRILLATOR IMPLANT;  Surgeon: Marinus Maw, MD;  Location: [facility] CATH LAB;  Service: Cardiovascular;  Laterality: N/A;  . Cardiac catheterization N/A 08/17/2014    Procedure: Right/Left Heart Cath and Coronary Angiography;  Surgeon: Kathleene Hazel, MD;  Location: [facility] INVASIVE CV LAB;  Service: Cardiovascular;  Laterality: N/A;  . Tee without cardioversion N/A 08/21/2014    Procedure: TRANSESOPHAGEAL ECHOCARDIOGRAM (TEE);  Surgeon: Chilton Si, MD;  Location: [facility] ENDOSCOPY;  Service: Cardiovascular;  Laterality: N/A;    Family History  Problem Relation Age of Onset  . Heart disease Mother   . Heart disease Father   . Lung disease  Sister   . Diabetes Sister   . Heart failure Brother   . Thyroid disease Neg Hx     Social History  Substance Use Topics  . Smoking status: Never Smoker   . Smokeless tobacco: Never Used  . Alcohol Use: No    No Known Allergies  Prior to Admission medications   Medication Sig Start Date End  Date Taking? Authorizing Provider  amiodarone (PACERONE) 200 MG tablet Take 1 tablet (200 mg total) by mouth daily. 10/09/14  Yes Duke Salvia, MD  apixaban (ELIQUIS) 5 MG TABS tablet Take 1 tablet (5 mg total) by mouth 2 (two) times daily. 11/12/14  Yes Wendall Stade, MD  carvedilol (COREG) 6.25 MG tablet Take 1 tablet (6.25 mg total) by mouth 2 (two) times daily with a meal. 08/22/14  Yes Azalee Course, PA  ENTRESTO 24-26 MG TAKE ONE TABLET BY MOUTH TWICE DAILY 10/11/14  Yes Delma Freeze, FNP  furosemide (LASIX) 80 MG tablet Take 1 tablet (80 mg total) by mouth 2 (two) times daily. 08/22/14  Yes Azalee Course, PA  methimazole (TAPAZOLE) 10 MG tablet Take 1 tablet (10 mg total) by mouth 2 (two) times daily. 09/11/14  Yes Romero Belling, MD  Potassium Chloride ER 20 MEQ TBCR Take 20 mEq by mouth daily. 10/19/14  Yes Wendall Stade, MD  spironolactone (ALDACTONE) 25 MG tablet Take 0.5 tablets (12.5 mg total) by mouth daily. 08/22/14  Yes Azalee Course, PA     Review of Systems  Constitutional: Positive for fatigue. Negative for appetite change and unexpected weight change.  HENT: Negative for congestion, postnasal drip and sore throat.   Eyes: Negative.  Negative for blurred vision.  Respiratory: Negative for cough, chest tightness and shortness of breath.   Cardiovascular: Negative for chest pain, palpitations and leg swelling.  Gastrointestinal: Negative for abdominal pain and abdominal distention.  Endocrine: Negative.   Genitourinary: Negative.   Musculoskeletal: Negative for back pain and neck pain.  Skin: Negative.   Allergic/Immunologic: Negative.   Neurological: Negative for dizziness, light-headedness and headaches.  Hematological: Negative for adenopathy. Does not bruise/bleed easily.  Psychiatric/Behavioral: Negative for sleep disturbance (sleeping on 2 pillows with CPAP nightly) and dysphoric mood. The patient is not nervous/anxious.        Objective:   Physical Exam  Constitutional: She is  oriented to person, place, and time. She appears well-developed and well-nourished.  HENT:  Head: Normocephalic and atraumatic.  Eyes: Conjunctivae are normal. Pupils are equal, round, and reactive to light.  Neck: Normal range of motion. Neck supple.  Cardiovascular: Normal rate and regular rhythm.   Pulmonary/Chest: Effort normal. She has no wheezes. She has no rales.  Abdominal: Soft. She exhibits no distension. There is no tenderness.  Musculoskeletal: She exhibits no edema or tenderness.  Neurological: She is alert and oriented to person, place, and time.  Skin: Skin is warm and dry.  Psychiatric: She has a normal mood and affect. Her behavior is normal. Thought content normal.  Nursing note and vitals reviewed.   BP 101/42 mmHg  Pulse 72  Resp 20  Ht 5' (1.524 m)  Wt 193 lb (87.544 kg)  BMI 37.69 kg/m2  SpO2 97%       Assessment & Plan:  1: Chronic heart failure with reduced ejection fraction- Patient presents with some fatigue upon exertion. She says that when she does get tired, she will stop what she's doing to rest until her energy level improves which she says occurs  pretty quickly. She has tolerated the addition of entresto without known side effects. She continues to weigh herself at home and does notice a weight gain over the last month. By our scale, she has gained 5 pounds since she was last here in October 2016. She says that she's not adding any salt to her food and is trying to follow a low sodium diet. 14 samples of entresto 24/26mg  were given to her today (Lot #[id number], EXP 3/17). A basic metabolic panel was drawn today and results were discussed with the patient. Potassium level is normal and renal function is only slightly elevated so will continue medications at this time.  2: HTN- Blood pressure is a little on the low side but she's currently without any symptoms so will not adjust medications at this time. Would like to titrate up entresto and add bidil if  possible. 3: Atrial fibrillation- Currently rate controlled at this time. Remains on eliquis, amiodarone and carvedilol.  Return in 2 months or sooner for any questions/problems before then.

## 2014-11-20 ENCOUNTER — Telehealth: Payer: Self-pay

## 2014-11-20 NOTE — Telephone Encounter (Signed)
Prior auth for [place] 24-26 sent to [facility]. This is required by [facility], before they will approve her for Patient Assistance.

## 2014-11-27 ENCOUNTER — Telehealth: Payer: Self-pay | Admitting: Cardiovascular Disease

## 2014-11-27 ENCOUNTER — Telehealth: Payer: Self-pay

## 2014-11-27 NOTE — Telephone Encounter (Signed)
Pt would like Eliquis samples.  

## 2014-11-27 NOTE — Telephone Encounter (Signed)
PT  WANTING   ELIQUIS SAMPLES   NO SAMPLES  AVAILABLE  INFORMED PT  TO CALL NEXT WEEK  AND  CHECK AGAIN ./CY  SPOKE WITH  ELIQUIS  REP. REP WILL BRING  SAMPLES  IN TOMORROW ./CY

## 2014-11-27 NOTE — Telephone Encounter (Signed)
Called pt to informed her that we did not have any samples of the Eliquis 5 mg tablets and that she could call back later this afternoon to see if we have gotten any samples in. I advised the pt that if she has any other problems, questions or concerns to call the office. Pt verbalized understanding.

## 2014-11-27 NOTE — Telephone Encounter (Signed)
New message    Pt calling to speak w/ RN concerning her eliquis. Please call back and discuss.

## 2014-11-27 NOTE — Telephone Encounter (Signed)
Called pt back to inform her that we still have not gotten any samples in of the Eliquis 5 mg tablets and that she could call back tomorrow to see if we have gotten any samples in.

## 2014-11-27 NOTE — Telephone Encounter (Signed)
Pt calling back to see if we have Eliquis samples. Please call

## 2014-12-10 ENCOUNTER — Other Ambulatory Visit: Payer: Self-pay | Admitting: Endocrinology

## 2014-12-20 ENCOUNTER — Other Ambulatory Visit: Payer: Self-pay | Admitting: *Deleted

## 2014-12-20 MED ORDER — FUROSEMIDE 80 MG PO TABS: 80.0000 mg | ORAL_TABLET | Freq: Two times a day (BID) | ORAL | Status: DC

## 2014-12-28 ENCOUNTER — Other Ambulatory Visit: Payer: Self-pay | Admitting: *Deleted

## 2014-12-28 MED ORDER — SACUBITRIL-VALSARTAN 24-26 MG PO TABS: 1.0000 | ORAL_TABLET | Freq: Two times a day (BID) | ORAL | Status: DC

## 2015-01-08 ENCOUNTER — Telehealth: Payer: Self-pay | Admitting: Internal Medicine

## 2015-01-08 ENCOUNTER — Encounter: Payer: Medicare Other | Admitting: *Deleted

## 2015-01-08 ENCOUNTER — Telehealth: Payer: Self-pay | Admitting: Cardiology

## 2015-01-08 NOTE — Telephone Encounter (Signed)
LMOVM reminding pt to send remote transmission.   

## 2015-01-08 NOTE — Telephone Encounter (Signed)
NEW MESSAGE   Pt is out of towna dn she will call when she gets back for her device

## 2015-01-09 ENCOUNTER — Other Ambulatory Visit: Payer: Self-pay | Admitting: Endocrinology

## 2015-01-11 ENCOUNTER — Encounter: Payer: Self-pay | Admitting: Cardiology

## 2015-01-22 ENCOUNTER — Ambulatory Visit: Payer: Medicare Other | Admitting: Family

## 2015-02-01 ENCOUNTER — Ambulatory Visit: Payer: Medicare Other | Admitting: Endocrinology

## 2015-02-04 ENCOUNTER — Other Ambulatory Visit: Payer: Self-pay | Admitting: Family

## 2015-02-04 MED ORDER — SACUBITRIL-VALSARTAN 24-26 MG PO TABS: 1.0000 | ORAL_TABLET | Freq: Two times a day (BID) | ORAL | Status: DC

## 2015-02-11 ENCOUNTER — Other Ambulatory Visit: Payer: Self-pay | Admitting: Endocrinology

## 2015-02-22 ENCOUNTER — Ambulatory Visit: Payer: Medicare Other | Admitting: Family

## 2015-02-22 ENCOUNTER — Other Ambulatory Visit: Payer: Self-pay | Admitting: Cardiovascular Disease

## 2015-03-01 ENCOUNTER — Other Ambulatory Visit: Payer: Self-pay

## 2015-03-04 MED ORDER — CARVEDILOL 6.25 MG PO TABS: 6.2500 mg | ORAL_TABLET | Freq: Two times a day (BID) | ORAL | Status: DC

## 2015-03-04 NOTE — Telephone Encounter (Signed)
Requested Prescriptions   Pending Prescriptions Disp Refills  . carvedilol (COREG) 6.25 MG tablet 60 tablet 3    Sig: Take 1 tablet (6.25 mg total) by mouth 2 (two) times daily with a meal.

## 2015-03-21 ENCOUNTER — Other Ambulatory Visit: Payer: Self-pay | Admitting: Physician Assistant

## 2015-03-21 ENCOUNTER — Other Ambulatory Visit: Payer: Self-pay | Admitting: Endocrinology

## 2015-03-21 ENCOUNTER — Other Ambulatory Visit: Payer: Self-pay

## 2015-03-21 MED ORDER — AMIODARONE HCL 200 MG PO TABS: 200.0000 mg | ORAL_TABLET | Freq: Every day | ORAL | Status: DC

## 2015-04-29 ENCOUNTER — Other Ambulatory Visit: Payer: Self-pay | Admitting: Internal Medicine

## 2015-04-30 NOTE — Telephone Encounter (Signed)
OK to refill x 4 refills, please put a note on there she needs to call and schedule follow up.  Thanks!

## 2015-04-30 NOTE — Telephone Encounter (Signed)
Refill sent for amiodarone, patient needs to make a follow up appointment.

## 2015-04-30 NOTE — Telephone Encounter (Signed)
Can you review this refill? Thanks!

## 2015-06-08 ENCOUNTER — Telehealth: Payer: Self-pay

## 2015-06-08 NOTE — Telephone Encounter (Signed)
Patient is on the list for [employer] 2017 and may be a good candidate for an AWV in 2017. Please let me know if/when appt is scheduled.   RE: Is Dr. Everardo All pts PCP?

## 2015-06-10 NOTE — Telephone Encounter (Signed)
See below

## 2015-06-10 NOTE — Telephone Encounter (Signed)
See note below and please advise, Thanks! 

## 2015-06-10 NOTE — Telephone Encounter (Signed)
No, I am not PCP

## 2015-06-28 ENCOUNTER — Other Ambulatory Visit: Payer: Self-pay | Admitting: Physician Assistant

## 2015-06-28 ENCOUNTER — Other Ambulatory Visit: Payer: Self-pay | Admitting: Internal Medicine

## 2015-06-28 NOTE — Telephone Encounter (Signed)
Please review for refill. Thanks!  

## 2015-07-24 ENCOUNTER — Telehealth: Payer: Self-pay | Admitting: Internal Medicine

## 2015-07-24 NOTE — Telephone Encounter (Signed)
3 attempts to schedule fu from recall list.  LMOV to call office for scheduling.  Deleting recall.  °

## 2015-10-15 ENCOUNTER — Telehealth: Payer: Self-pay

## 2015-10-15 NOTE — Telephone Encounter (Signed)
Attempted to call patient to verify she is still taking Entresto. If so, she needs to re-apply with [facility]. I had to leave a voicemail message.

## 2016-03-20 ENCOUNTER — Encounter: Payer: Self-pay | Admitting: Cardiology

## 2016-08-19 ENCOUNTER — Encounter: Payer: Self-pay | Admitting: *Deleted

## 2016-08-19 ENCOUNTER — Inpatient Hospital Stay (HOSPITAL_COMMUNITY)
Admit: 2016-08-19 | Discharge: 2016-08-19 | Disposition: A | Discharge status: Home / Self Care | Payer: Medicare Other | Attending: Internal Medicine | Admitting: Internal Medicine

## 2016-08-19 ENCOUNTER — Inpatient Hospital Stay
Admission: EM | Admit: 2016-08-19 | Discharge: 2016-08-26 | DRG: 246 | Disposition: A | Discharge status: Home / Self Care | Payer: Medicare Other | Attending: Internal Medicine | Admitting: Internal Medicine

## 2016-08-19 ENCOUNTER — Emergency Department: Payer: Medicare Other

## 2016-08-19 DIAGNOSIS — I340 Nonrheumatic mitral (valve) insufficiency: Secondary | ICD-10-CM

## 2016-08-19 DIAGNOSIS — I472 Ventricular tachycardia, unspecified: Secondary | ICD-10-CM

## 2016-08-19 DIAGNOSIS — I25110 Atherosclerotic heart disease of native coronary artery with unstable angina pectoris: Secondary | ICD-10-CM | POA: Diagnosis present

## 2016-08-19 DIAGNOSIS — G4733 Obstructive sleep apnea (adult) (pediatric): Secondary | ICD-10-CM | POA: Diagnosis present

## 2016-08-19 DIAGNOSIS — E785 Hyperlipidemia, unspecified: Secondary | ICD-10-CM | POA: Diagnosis present

## 2016-08-19 DIAGNOSIS — E876 Hypokalemia: Secondary | ICD-10-CM | POA: Diagnosis present

## 2016-08-19 DIAGNOSIS — I4901 Ventricular fibrillation: Principal | ICD-10-CM | POA: Diagnosis present

## 2016-08-19 DIAGNOSIS — I361 Nonrheumatic tricuspid (valve) insufficiency: Secondary | ICD-10-CM

## 2016-08-19 DIAGNOSIS — E039 Hypothyroidism, unspecified: Secondary | ICD-10-CM | POA: Diagnosis present

## 2016-08-19 DIAGNOSIS — Z9119 Patient's noncompliance with other medical treatment and regimen: Secondary | ICD-10-CM | POA: Diagnosis not present

## 2016-08-19 DIAGNOSIS — J9621 Acute and chronic respiratory failure with hypoxia: Secondary | ICD-10-CM | POA: Diagnosis present

## 2016-08-19 DIAGNOSIS — Z95810 Presence of automatic (implantable) cardiac defibrillator: Secondary | ICD-10-CM | POA: Diagnosis not present

## 2016-08-19 DIAGNOSIS — I200 Unstable angina: Secondary | ICD-10-CM | POA: Diagnosis not present

## 2016-08-19 DIAGNOSIS — I2510 Atherosclerotic heart disease of native coronary artery without angina pectoris: Secondary | ICD-10-CM | POA: Diagnosis present

## 2016-08-19 DIAGNOSIS — I248 Other forms of acute ischemic heart disease: Secondary | ICD-10-CM | POA: Diagnosis present

## 2016-08-19 DIAGNOSIS — Z90710 Acquired absence of both cervix and uterus: Secondary | ICD-10-CM | POA: Diagnosis not present

## 2016-08-19 DIAGNOSIS — N179 Acute kidney failure, unspecified: Secondary | ICD-10-CM | POA: Diagnosis present

## 2016-08-19 DIAGNOSIS — J9601 Acute respiratory failure with hypoxia: Secondary | ICD-10-CM

## 2016-08-19 DIAGNOSIS — I5023 Acute on chronic systolic (congestive) heart failure: Secondary | ICD-10-CM | POA: Diagnosis present

## 2016-08-19 DIAGNOSIS — Z7189 Other specified counseling: Secondary | ICD-10-CM | POA: Diagnosis not present

## 2016-08-19 DIAGNOSIS — Z79899 Other long term (current) drug therapy: Secondary | ICD-10-CM

## 2016-08-19 DIAGNOSIS — I2582 Chronic total occlusion of coronary artery: Secondary | ICD-10-CM | POA: Diagnosis present

## 2016-08-19 DIAGNOSIS — Z8249 Family history of ischemic heart disease and other diseases of the circulatory system: Secondary | ICD-10-CM

## 2016-08-19 DIAGNOSIS — I270 Primary pulmonary hypertension: Secondary | ICD-10-CM | POA: Diagnosis present

## 2016-08-19 DIAGNOSIS — Z7901 Long term (current) use of anticoagulants: Secondary | ICD-10-CM

## 2016-08-19 DIAGNOSIS — Z6841 Body Mass Index (BMI) 40.0 and over, adult: Secondary | ICD-10-CM | POA: Diagnosis not present

## 2016-08-19 DIAGNOSIS — I480 Paroxysmal atrial fibrillation: Secondary | ICD-10-CM | POA: Diagnosis present

## 2016-08-19 DIAGNOSIS — I428 Other cardiomyopathies: Secondary | ICD-10-CM | POA: Diagnosis present

## 2016-08-19 DIAGNOSIS — E669 Obesity, unspecified: Secondary | ICD-10-CM | POA: Diagnosis present

## 2016-08-19 DIAGNOSIS — Z4502 Encounter for adjustment and management of automatic implantable cardiac defibrillator: Secondary | ICD-10-CM | POA: Diagnosis not present

## 2016-08-19 DIAGNOSIS — N183 Chronic kidney disease, stage 3 unspecified: Secondary | ICD-10-CM

## 2016-08-19 DIAGNOSIS — I130 Hypertensive heart and chronic kidney disease with heart failure and stage 1 through stage 4 chronic kidney disease, or unspecified chronic kidney disease: Secondary | ICD-10-CM | POA: Diagnosis present

## 2016-08-19 DIAGNOSIS — I214 Non-ST elevation (NSTEMI) myocardial infarction: Secondary | ICD-10-CM

## 2016-08-19 DIAGNOSIS — I509 Heart failure, unspecified: Secondary | ICD-10-CM

## 2016-08-19 DIAGNOSIS — R739 Hyperglycemia, unspecified: Secondary | ICD-10-CM | POA: Diagnosis present

## 2016-08-19 DIAGNOSIS — E0590 Thyrotoxicosis, unspecified without thyrotoxic crisis or storm: Secondary | ICD-10-CM | POA: Diagnosis present

## 2016-08-19 DIAGNOSIS — Z9114 Patient's other noncompliance with medication regimen: Secondary | ICD-10-CM

## 2016-08-19 DIAGNOSIS — I25118 Atherosclerotic heart disease of native coronary artery with other forms of angina pectoris: Secondary | ICD-10-CM | POA: Diagnosis not present

## 2016-08-19 DIAGNOSIS — R9439 Abnormal result of other cardiovascular function study: Secondary | ICD-10-CM | POA: Diagnosis not present

## 2016-08-19 LAB — GLUCOSE, CAPILLARY
Glucose-Capillary: 100 mg/dL — ABNORMAL HIGH (ref 65–99)
Glucose-Capillary: 105 mg/dL — ABNORMAL HIGH (ref 65–99)
Glucose-Capillary: 110 mg/dL — ABNORMAL HIGH (ref 65–99)
Glucose-Capillary: 113 mg/dL — ABNORMAL HIGH (ref 65–99)

## 2016-08-19 LAB — CBC WITH DIFFERENTIAL/PLATELET
Basophils Absolute: 0.1 10*3/uL (ref 0–0.1)
Basophils Relative: 1 %
Eosinophils Absolute: 0.4 10*3/uL (ref 0–0.7)
Eosinophils Relative: 5 %
HCT: 39.7 % (ref 35.0–47.0)
HEMOGLOBIN: 12.7 g/dL (ref 12.0–16.0)
LYMPHS ABS: 3 10*3/uL (ref 1.0–3.6)
LYMPHS PCT: 35 %
MCH: 24 pg — AB (ref 26.0–34.0)
MCHC: 32 g/dL (ref 32.0–36.0)
MCV: 75 fL — AB (ref 80.0–100.0)
MONOS PCT: 13 %
Monocytes Absolute: 1.1 10*3/uL — ABNORMAL HIGH (ref 0.2–0.9)
NEUTROS PCT: 46 %
Neutro Abs: 3.9 10*3/uL (ref 1.4–6.5)
Platelets: 361 10*3/uL (ref 150–440)
RBC: 5.29 MIL/uL — AB (ref 3.80–5.20)
RDW: 17.1 % — ABNORMAL HIGH (ref 11.5–14.5)
WBC: 8.6 10*3/uL (ref 3.6–11.0)

## 2016-08-19 LAB — BASIC METABOLIC PANEL
ANION GAP: 9 (ref 5–15)
BUN: 17 mg/dL (ref 6–20)
CO2: 25 mmol/L (ref 22–32)
Calcium: 9 mg/dL (ref 8.9–10.3)
Chloride: 105 mmol/L (ref 101–111)
Creatinine, Ser: 1.27 mg/dL — ABNORMAL HIGH (ref 0.44–1.00)
GFR calc Af Amer: 48 mL/min — ABNORMAL LOW (ref >60)
GFR, EST NON AFRICAN AMERICAN: 41 mL/min — AB (ref >60)
GLUCOSE: 114 mg/dL — AB (ref 65–99)
Potassium: 3.4 mmol/L — ABNORMAL LOW (ref 3.5–5.1)
Sodium: 139 mmol/L (ref 135–145)

## 2016-08-19 LAB — PROTIME-INR
INR: 1.08
INR: 1.06
PROTHROMBIN TIME: 14 s (ref 11.4–15.2)
Prothrombin Time: 13.8 seconds (ref 11.4–15.2)

## 2016-08-19 LAB — BRAIN NATRIURETIC PEPTIDE: B Natriuretic Peptide: 892 pg/mL — ABNORMAL HIGH (ref 0.0–100.0)

## 2016-08-19 LAB — HEPARIN LEVEL (UNFRACTIONATED)
HEPARIN UNFRACTIONATED: 0.28 [IU]/mL — AB (ref 0.30–0.70)
Heparin Unfractionated: <0.1 IU/mL — ABNORMAL LOW (ref 0.30–0.70)

## 2016-08-19 LAB — TSH
TSH: 7.699 u[IU]/mL — ABNORMAL HIGH (ref 0.350–4.500)
TSH: 4.902 u[IU]/mL — ABNORMAL HIGH (ref 0.350–4.500)

## 2016-08-19 LAB — HEMOGLOBIN A1C
Hgb A1c MFr Bld: 6 % — ABNORMAL HIGH (ref 4.8–5.6)
MEAN PLASMA GLUCOSE: 125.5 mg/dL

## 2016-08-19 LAB — APTT: APTT: 29 s (ref 24–36)

## 2016-08-19 LAB — TROPONIN I
Troponin I: 0.39 ng/mL (ref <0.03)
Troponin I: 0.37 ng/mL (ref <0.03)

## 2016-08-19 LAB — MRSA PCR SCREENING: MRSA BY PCR: NEGATIVE

## 2016-08-19 LAB — MAGNESIUM: MAGNESIUM: 2.1 mg/dL (ref 1.7–2.4)

## 2016-08-19 MED ORDER — ASPIRIN EC 81 MG PO TBEC
81.0000 mg | DELAYED_RELEASE_TABLET | Freq: Every day | ORAL | Status: DC
  Administered 2016-08-19 – 2016-08-26 (×7): 81 mg via ORAL
  Filled 2016-08-19 (×9): qty 1

## 2016-08-19 MED ORDER — ACETAMINOPHEN 650 MG RE SUPP: 650.0000 mg | Freq: Four times a day (QID) | RECTAL | Status: DC | PRN

## 2016-08-19 MED ORDER — HEPARIN BOLUS VIA INFUSION
4000.0000 [IU] | Freq: Once | INTRAVENOUS | Status: AC
  Administered 2016-08-19: 4000 [IU] via INTRAVENOUS
  Filled 2016-08-19: qty 4000

## 2016-08-19 MED ORDER — SODIUM CHLORIDE 0.9% FLUSH
3.0000 mL | Freq: Two times a day (BID) | INTRAVENOUS | Status: DC
  Administered 2016-08-19 – 2016-08-24 (×9): 3 mL via INTRAVENOUS

## 2016-08-19 MED ORDER — ONDANSETRON HCL 4 MG PO TABS: 4.0000 mg | ORAL_TABLET | Freq: Four times a day (QID) | ORAL | Status: DC | PRN

## 2016-08-19 MED ORDER — SPIRONOLACTONE 25 MG PO TABS
12.5000 mg | ORAL_TABLET | Freq: Every day | ORAL | Status: DC
  Administered 2016-08-19 – 2016-08-26 (×7): 12.5 mg via ORAL
  Filled 2016-08-19 (×8): qty 1

## 2016-08-19 MED ORDER — SACUBITRIL-VALSARTAN 24-26 MG PO TABS
1.0000 | ORAL_TABLET | Freq: Two times a day (BID) | ORAL | Status: DC
  Administered 2016-08-19 – 2016-08-26 (×11): 1 via ORAL
  Filled 2016-08-19 (×14): qty 1

## 2016-08-19 MED ORDER — CARVEDILOL 6.25 MG PO TABS
6.2500 mg | ORAL_TABLET | Freq: Two times a day (BID) | ORAL | Status: DC
  Administered 2016-08-19 – 2016-08-25 (×11): 6.25 mg via ORAL
  Filled 2016-08-19 (×13): qty 1

## 2016-08-19 MED ORDER — POTASSIUM CHLORIDE CRYS ER 20 MEQ PO TBCR
40.0000 meq | EXTENDED_RELEASE_TABLET | Freq: Once | ORAL | Status: AC
  Administered 2016-08-19: 40 meq via ORAL
  Filled 2016-08-19: qty 2

## 2016-08-19 MED ORDER — FUROSEMIDE 40 MG PO TABS
80.0000 mg | ORAL_TABLET | Freq: Two times a day (BID) | ORAL | Status: DC
  Administered 2016-08-20 – 2016-08-23 (×7): 80 mg via ORAL
  Filled 2016-08-19 (×2): qty 2
  Filled 2016-08-19: qty 4
  Filled 2016-08-19: qty 2
  Filled 2016-08-19: qty 4
  Filled 2016-08-19 (×2): qty 2

## 2016-08-19 MED ORDER — ORAL CARE MOUTH RINSE
15.0000 mL | Freq: Two times a day (BID) | OROMUCOSAL | Status: DC
  Administered 2016-08-19 – 2016-08-23 (×7): 15 mL via OROMUCOSAL

## 2016-08-19 MED ORDER — IPRATROPIUM-ALBUTEROL 0.5-2.5 (3) MG/3ML IN SOLN
3.0000 mL | Freq: Four times a day (QID) | RESPIRATORY_TRACT | Status: DC
  Administered 2016-08-19 – 2016-08-22 (×13): 3 mL via RESPIRATORY_TRACT
  Filled 2016-08-19 (×13): qty 3

## 2016-08-19 MED ORDER — AMIODARONE LOAD VIA INFUSION
150.0000 mg | Freq: Once | INTRAVENOUS | Status: AC
  Administered 2016-08-19: 150 mg via INTRAVENOUS
  Filled 2016-08-19: qty 83.34

## 2016-08-19 MED ORDER — AMIODARONE HCL IN DEXTROSE 360-4.14 MG/200ML-% IV SOLN
30.0000 mg/h | INTRAVENOUS | Status: DC
  Administered 2016-08-19 – 2016-08-20 (×2): 30 mg/h via INTRAVENOUS
  Filled 2016-08-19: qty 200

## 2016-08-19 MED ORDER — ACETAMINOPHEN 325 MG PO TABS: 650.0000 mg | ORAL_TABLET | Freq: Four times a day (QID) | ORAL | Status: DC | PRN

## 2016-08-19 MED ORDER — HEPARIN (PORCINE) IN NACL 100-0.45 UNIT/ML-% IJ SOLN
750.0000 [IU]/h | INTRAMUSCULAR | Status: DC
  Administered 2016-08-19: 800 [IU]/h via INTRAVENOUS
  Administered 2016-08-20 – 2016-08-23 (×4): 1000 [IU]/h via INTRAVENOUS
  Filled 2016-08-19 (×5): qty 250

## 2016-08-19 MED ORDER — FUROSEMIDE 10 MG/ML IJ SOLN
60.0000 mg | Freq: Once | INTRAMUSCULAR | Status: AC
  Administered 2016-08-19: 60 mg via INTRAVENOUS
  Filled 2016-08-19: qty 8

## 2016-08-19 MED ORDER — AMIODARONE HCL IN DEXTROSE 360-4.14 MG/200ML-% IV SOLN
60.0000 mg/h | INTRAVENOUS | Status: DC
  Administered 2016-08-19 (×2): 60 mg/h via INTRAVENOUS
  Filled 2016-08-19 (×2): qty 200

## 2016-08-19 MED ORDER — SODIUM CHLORIDE 0.9% FLUSH: 3.0000 mL | INTRAVENOUS | Status: DC | PRN

## 2016-08-19 MED ORDER — POTASSIUM CHLORIDE CRYS ER 20 MEQ PO TBCR
20.0000 meq | EXTENDED_RELEASE_TABLET | Freq: Every day | ORAL | Status: DC
  Administered 2016-08-20 – 2016-08-26 (×6): 20 meq via ORAL
  Filled 2016-08-19 (×7): qty 1

## 2016-08-19 MED ORDER — METHIMAZOLE 10 MG PO TABS
10.0000 mg | ORAL_TABLET | Freq: Two times a day (BID) | ORAL | Status: DC
  Administered 2016-08-19 – 2016-08-26 (×12): 10 mg via ORAL
  Filled 2016-08-19 (×16): qty 1

## 2016-08-19 MED ORDER — NITROGLYCERIN 2 % TD OINT
1.0000 [in_us] | TOPICAL_OINTMENT | Freq: Four times a day (QID) | TRANSDERMAL | Status: DC
  Administered 2016-08-19 – 2016-08-24 (×17): 1 [in_us] via TOPICAL
  Filled 2016-08-19 (×17): qty 1

## 2016-08-19 MED ORDER — SODIUM CHLORIDE 0.9 % IV SOLN
250.0000 mL | INTRAVENOUS | Status: DC | PRN
Start: 2016-08-19 — End: 2016-08-25

## 2016-08-19 MED ORDER — ONDANSETRON HCL 4 MG/2ML IJ SOLN
4.0000 mg | Freq: Four times a day (QID) | INTRAMUSCULAR | Status: DC | PRN
  Administered 2016-08-24: 4 mg via INTRAVENOUS

## 2016-08-19 NOTE — ED Provider Notes (Addendum)
[facility] Emergency Department Provider Note  ____________________________________________   First MD Initiated Contact with Patient 08/19/16 1501     (approximate)  I have reviewed the triage vital signs and the nursing notes.   HISTORY  Chief Complaint Weakness   HPI Erin [name] is a 71 y.o. female with a history of CAD, CHF and atrial fibrillation on eliquis was present in the emergency department today with increasing shortness of breathand a "weakness in my chest." She says that she was shopping at a supermarket today when she started having a weak feeling in her chest and then felt a "zap." She did not lose consciousness. She did not feel her heart racing or any palpitations. She denies taking any other medications today but says that she is compliant with her amiodarone, carvedilol.  The other medications she has not taken for weeks. She denies any pain.her son-in-law is present at the bedside and says that he has noticed the patient having increasing difficulty with breathing over the past several weeks as well. The patient says that she has to sleep sitting up with several pillows propped behind her back but this is a chronic issue. She is also supposed to be wearing CPAP at night but does not.   Past Medical History:  Diagnosis Date  . Anginal pain (HCC)   . Automatic implantable cardioverter-defibrillator in situ    a. s/p MDT ICD 07/2012; b. SN # [id number]   . Chronic systolic CHF (congestive heart failure) (HCC)    a. echo 2014: EF 25%, diffuse HK, LA moderately dilated, RA mildly dilated, PASP 38 mm Hg; b. echo 08/2014: EF 25-30%, diffuse HK, RWMA cannot be excluded, mild MR, mild biatrial enlargement, RV mildly dilated, wall thickness nl, pacer wire/catheter noted, mild to mod TR, PASP high nl  . Coronary artery disease, non-occlusive    a. cath 2003: LM nl, mid LAD 50%, LCx nl, RCA nl b. L&RHC 08/17/2014 100% occluded mid LCx, 70% mid LAD  with FFR 0.82. Medical therapy. CI 2.1. CO 4.01  . Encounter for long-term (current) use of anticoagulants   . Exertional shortness of breath   . H/O medication noncompliance   . High cholesterol   . Hypertension   . NICM (nonischemic cardiomyopathy) (HCC)    a. s/p AICD 07/22/2012  . Obesity   . OSA on CPAP    "suppose to; not often" (07/22/2012)  . Other "heavy-for-dates" infants   . PAF (paroxysmal atrial fibrillation) (HCC)    a. on warfarin--> Eliquis 08/2014    Patient Active Problem List   Diagnosis Date Noted  . 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 Chronic systolic heart failure (HCC) 08/30/2014  . Coronary artery disease involving native coronary artery of native heart without angina pectoris   . Anemia 08/15/2014  . Coronary artery disease, non-occlusive   . H/O medication noncompliance   . PAF (paroxysmal atrial fibrillation) (HCC)   . Atrial fibrillation with RVR (HCC) 08/13/2014  . Chronic systolic CHF (congestive heart failure) (HCC) 09/30/2012  . HTN (hypertension) 08/24/2012  . Congestive dilated cardiomyopathy (HCC) 07/23/2012  . AICD (automatic cardioverter/defibrillator) present 07/23/2012  . Long term current use of anticoagulant therapy 07/23/2010  . Complex sleep apnea syndrome 11/20/2007  . CORONARY ATHEROSCLEROSIS NATIVE CORONARY ARTERY 11/20/2007  . PULMONARY HYPERTENSION 11/20/2007    Past Surgical History:  Procedure Laterality Date  . ABDOMINAL HYSTERECTOMY  ~ 1998   "laparoscopic" (07/22/2012)  . CARDIAC CATHETERIZATION  ? 1990's  .  CARDIAC CATHETERIZATION N/A 08/17/2014   Procedure: Right/Left Heart Cath and Coronary Angiography;  Surgeon: Kathleene Hazel, MD;  Location: [facility] INVASIVE CV LAB;  Service: Cardiovascular;  Laterality: N/A;  . CARDIAC DEFIBRILLATOR PLACEMENT  07/22/2012   dual-chamber ICD.  Marland Kitchen IMPLANTABLE CARDIOVERTER DEFIBRILLATOR IMPLANT N/A 07/22/2012   Procedure: IMPLANTABLE CARDIOVERTER DEFIBRILLATOR IMPLANT;   Surgeon: Marinus Maw, MD;  Location: [facility] CATH LAB;  Service: Cardiovascular;  Laterality: N/A;  . TEE WITHOUT CARDIOVERSION N/A 08/21/2014   Procedure: TRANSESOPHAGEAL ECHOCARDIOGRAM (TEE);  Surgeon: Chilton Si, MD;  Location: [facility] ENDOSCOPY;  Service: Cardiovascular;  Laterality: N/A;  . TUBAL LIGATION  ~ 1978    Prior to Admission medications   Medication Sig Start Date End Date Taking? Authorizing Provider  amiodarone (PACERONE) 200 MG tablet TAKE ONE TABLET BY MOUTH ONCE DAILY *CALL OFFICE AND SCHEDULE APPOINTMENT TO SEE DOCTOR BEFORE ANY FUTURE REFILLS WILL BE SENT* 04/30/15   Duke Salvia, MD  apixaban (ELIQUIS) 5 MG TABS tablet Take 1 tablet (5 mg total) by mouth 2 (two) times daily. 11/12/14   Wendall Stade, MD  carvedilol (COREG) 6.25 MG tablet Take 1 tablet (6.25 mg total) by mouth 2 (two) times daily with a meal. 03/04/15   Gollan, Tollie Pizza, MD  furosemide (LASIX) 80 MG tablet Take 1 tablet (80 mg total) by mouth 2 (two) times daily. 12/20/14   Azalee Course, PA  KLOR-CON M20 20 MEQ tablet Take 1 tablet (20 mEq total) by mouth daily. APPOINTMENT NEEDED 06/28/15   Duke Salvia, MD  methimazole (TAPAZOLE) 10 MG tablet TAKE ONE TABLET BY MOUTH TWICE DAILY 03/21/15   Romero Belling, MD  sacubitril-valsartan (ENTRESTO) 24-26 MG Take 1 tablet by mouth 2 (two) times daily. 02/04/15   Delma Freeze, FNP  sacubitril-valsartan (ENTRESTO) 24-26 MG Take 1 tablet by mouth 2 (two) times daily. APPOINTMENT NEEDED 06/28/15   Duke Salvia, MD  spironolactone (ALDACTONE) 25 MG tablet Take 0.5 tablets (12.5 mg total) by mouth daily. APPOINTMENT NEEDED 06/28/15   Duke Salvia, MD    Allergies Patient has no known allergies.  Family History  Problem Relation Age of Onset  . Heart disease Mother   . Heart disease Father   . Lung disease Sister   . Diabetes Sister   . Heart failure Brother   . Thyroid disease Neg Hx     Social History Social History  Substance Use Topics  . Smoking  status: Never Smoker  . Smokeless tobacco: Never Used  . Alcohol use No    Review of Systems  Constitutional: No fever/chills Eyes: No visual changes. ENT: No sore throat. Cardiovascular: as above Respiratory: as above Gastrointestinal: No abdominal pain.  No nausea, no vomiting.  No diarrhea.  No constipation. Genitourinary: Negative for dysuria. Musculoskeletal: Negative for back pain. Skin: Negative for rash. Neurological: Negative for headaches, focal weakness or numbness.   ____________________________________________   PHYSICAL EXAM:  VITAL SIGNS: ED Triage Vitals  Enc Vitals Group     BP 08/19/16 1505 (!) 187/105     Pulse Rate 08/19/16 1505 (!) 114     Resp 08/19/16 1505 20     Temp 08/19/16 1505 98.9 F (37.2 C)     Temp Source 08/19/16 1505 Oral     SpO2 08/19/16 1505 94 %     Weight 08/19/16 1500 220 lb (99.8 kg)     Height 08/19/16 1500 5' (1.524 m)     Head Circumference --  Peak Flow --      Pain Score --      Pain Loc --      Pain Edu? --      Excl. in GC? --     Constitutional: Alert and oriented. Well appearing and in no acute distress. Eyes: Conjunctivae are normal.  Head: Atraumatic. Nose: No congestion/rhinnorhea. Mouth/Throat: Mucous membranes are moist.  Neck: No stridor.   Cardiovascular: tachycardic, regular rhythm. Grossly normal heart sounds.   Respiratory: tachypnea with pursed lip breathing. Rales to the lower and mid fields. Gastrointestinal: Soft and nontender. No distention.  Musculoskeletal: mild bilateral lower extremity edema which is baseline for the patient..  No joint effusions. Neurologic:  Normal speech and language. No gross focal neurologic deficits are appreciated. Skin:  Skin is warm, dry and intact. No rash noted. Psychiatric: Mood and affect are normal. Speech and behavior are normal.  ____________________________________________   LABS (all labs ordered are listed, but only abnormal results are  displayed)  Labs Reviewed  CBC WITH DIFFERENTIAL/PLATELET - Abnormal; Notable for the following:       Result Value   RBC 5.29 (*)    MCV 75.0 (*)    MCH 24.0 (*)    RDW 17.1 (*)    Monocytes Absolute 1.1 (*)    All other components within normal limits  BRAIN NATRIURETIC PEPTIDE - Abnormal; Notable for the following:    B Natriuretic Peptide 892.0 (*)    All other components within normal limits  BASIC METABOLIC PANEL - Abnormal; Notable for the following:    Potassium 3.4 (*)    Glucose, Bld 114 (*)    Creatinine, Ser 1.27 (*)    GFR calc non Af Amer 41 (*)    GFR calc Af Amer 48 (*)    All other components within normal limits  TROPONIN I - Abnormal; Notable for the following:    Troponin I 0.39 (*)    All other components within normal limits  GLUCOSE, CAPILLARY - Abnormal; Notable for the following:    Glucose-Capillary 100 (*)    All other components within normal limits  HEPARIN LEVEL (UNFRACTIONATED)  APTT  PROTIME-INR   ____________________________________________  EKG  ED ECG REPORT I, Arelia Longest, the attending physician, personally viewed and interpreted this ECG.   Date: 08/19/2016  EKG Time: 1504  Rate: :15  Rhythm: sinus tachycardia  Axis: normal  Intervals:none  ST&T Change: no ST elevation or depression. T-wave inversions in V5 and V6. No significant change from EKG of 10/09/2014. ____________________________________________  RADIOLOGY  No acute pathology on the chest x-ray ____________________________________________   PROCEDURES  Procedure(s) performed:   Procedures  Critical Care performed:  CRITICAL CARE Performed by: Arelia Longest   Total critical care time: 35 minutes  Critical care time was exclusive of separately billable procedures and treating other patients.  Critical care was necessary to treat or prevent imminent or life-threatening deterioration.  Critical care was time spent personally by me on the  following activities: development of treatment plan with patient and/or surrogate as well as nursing, discussions with consultants, evaluation of patient's response to treatment, examination of patient, obtaining history from patient or surrogate, ordering and performing treatments and interventions, ordering and review of laboratory studies, ordering and review of radiographic studies, pulse oximetry and re-evaluation of patient's condition.   ____________________________________________   INITIAL IMPRESSION / ASSESSMENT AND PLAN / ED COURSE  Pertinent labs & imaging results that were available during my care  of the patient were reviewed by me and considered in my medical decision making (see chart for details).  ----------------------------------------- 3:58 PM on 08/19/2016 -----------------------------------------  Patient says that she feels subjectively better after dose of Lasix. However, she is still pursed lip breathing. Family member the bedside says that this is baseline breathing for her even when she is just sitting at home. Patient will be started on an amiodarone drip Given that she had a pacemaker interrogation today that showed that a shock was delivered and she is now showing a troponin of 0.39.I explain the labsas well as the treatment plan to the patient and family. The patient be admitted. The patient and familyding and willing to comply.     ____________________________________________   FINAL CLINICAL IMPRESSION(S) / ED DIAGNOSES  NSTEMI.  Vfib.  Defibrillation.  CHF.      NEW MEDICATIONS STARTED DURING THIS VISIT:  New Prescriptions   No medications on file     Note:  This document was prepared using Dragon voice recognition software and may include unintentional dictation errors.     Myrna Blazer, MD 08/19/16 1600  Signed out to the hospitalist,Patel.  Patient still with oxygen saturation of 90% on nasal cannula oxygen. We will start  her on BiPAP.    Myrna Blazer, MD 08/19/16 1611    Pershing Proud Myra Rude, MD 08/19/16 [phone]

## 2016-08-19 NOTE — ED Notes (Signed)
RT to bedside at this time.

## 2016-08-19 NOTE — ED Notes (Signed)
ICD interrogated  

## 2016-08-19 NOTE — ED Notes (Signed)
RT at bedside.

## 2016-08-19 NOTE — Consult Note (Signed)
ANTICOAGULATION CONSULT NOTE - Initial Consult  Pharmacy Consult for heparin drip Indication: chest pain/ACS  No Known Allergies  Patient Measurements: Height: 5' (152.4 cm) Weight: 220 lb (99.8 kg) IBW/kg (Calculated) : 45.5 Heparin Dosing Weight: 69.7kg  Vital Signs: Temp: 98.9 F (37.2 C) (08/15 1505) Temp Source: Oral (08/15 1505) BP: 187/105 (08/15 1505) Pulse Rate: 114 (08/15 1505)  Labs:  Recent Labs  08/19/16 1509  HGB 12.7  HCT 39.7  PLT 361  CREATININE 1.27*  TROPONINI 0.39*    Estimated Creatinine Clearance: 43.1 mL/min (A) (by C-G formula based on SCr of 1.27 mg/dL (H)).   Medical History: Past Medical History:  Diagnosis Date  . Anginal pain (HCC)   . Automatic implantable cardioverter-defibrillator in situ    a. s/p MDT ICD 07/2012; b. SN # [id number]   . Chronic systolic CHF (congestive heart failure) (HCC)    a. echo 2014: EF 25%, diffuse HK, LA moderately dilated, RA mildly dilated, PASP 38 mm Hg; b. echo 08/2014: EF 25-30%, diffuse HK, RWMA cannot be excluded, mild MR, mild biatrial enlargement, RV mildly dilated, wall thickness nl, pacer wire/catheter noted, mild to mod TR, PASP high nl  . Coronary artery disease, non-occlusive    a. cath 2003: LM nl, mid LAD 50%, LCx nl, RCA nl b. L&RHC 08/17/2014 100% occluded mid LCx, 70% mid LAD with FFR 0.82. Medical therapy. CI 2.1. CO 4.01  . Encounter for long-term (current) use of anticoagulants   . Exertional shortness of breath   . H/O medication noncompliance   . High cholesterol   . Hypertension   . NICM (nonischemic cardiomyopathy) (HCC)    a. s/p AICD 07/22/2012  . Obesity   . OSA on CPAP    "suppose to; not often" (07/22/2012)  . Other "heavy-for-dates" infants   . PAF (paroxysmal atrial fibrillation) (HCC)    a. on warfarin--> Eliquis 08/2014    Medications:  Scheduled:  . amiodarone  150 mg Intravenous Once  . heparin  4,000 Units Intravenous Once    Assessment: Patient is a 71 year  old female w/ a PMH of CAD, CHF, Afib on eliquis, ICD who presents with SOB, "weakness in my chest." patient first troponin is elevated at 0.39. Pharmacy consulted to dose heparin drip. Patient states that she cant remember the last time she took her eliquis, but she knows that it hasn't been since at least Sunday.   Goal of Therapy:  Heparin level 0.3-0.7 units/ml aPTT 66-102 seconds Monitor platelets by anticoagulation protocol: Yes   Plan:  I will order a baseline HL, APTT and INR. It has been almost 72 since last dose, we believe. But if it is elevated, will dose off of APTT until they corollate. Give 4000 units bolus x 1 Start heparin infusion at 800 units/hr Check anti-Xa level in 8 hours and daily while on heparin Continue to monitor H&H and platelets  Richell Corker D Katalin Colledge, Pharm.D, BCPS Clinical Pharmacist  08/19/2016,3:56 PM

## 2016-08-19 NOTE — ED Notes (Signed)
Pt placed on 2L [place]

## 2016-08-19 NOTE — ED Notes (Signed)
RT notified to place pt on Bipap. 

## 2016-08-19 NOTE — ED Notes (Signed)
MD made aware of pt labored breathing, pts 02 [place] put at 4L

## 2016-08-19 NOTE — H&P (Signed)
[facility] [place] [facility]   PATIENT NAME: Erin [name]    MR#:  [id number]  DATE OF BIRTH:  04-29-1945  DATE OF ADMISSION:  08/19/2016  PRIMARY CARE PHYSICIAN: Patient, No Pcp Per   REQUESTING/REFERRING PHYSICIAN:   CHIEF COMPLAINT:   Chief Complaint  Patient presents with  . Weakness    HISTORY OF PRESENT ILLNESS: Erin [name]  is a 71 y.o. female with a known history of Nonischemic cardiomyopathy, ejection fraction of 30-35%, according to echo in 2016, age fibrillation, coronary artery disease, primary pulmonary hypertension, hypertension, hyperlipidemia, obesity, on CPAP, for obstructive sleep apnea, status post AICD placement in July 2014, who presents to the hospital with complaints of AICD firing a conjugate. The patient, she was in the grocery store when she felt very weak, she had some chest pain, which lasted approximately 1 minute, and that she had electrical shock. She was brought to emergency room for further evaluation where she was noted to be somewhat hypoxic with O2 sats in high 80s, rales were noted and patient was placed on BiPAP. Her AICD was interrogated revealing V. tach/V. fib lasting approximately 30 seconds, terminated by electrical shock. Apparently, patient had another arrhythmias, including V. tach lasting 5 minutes in the recent past. She admits not taking Eliquis for a few month and missing dose of amiodarone today. Hospitalist services were contacted for admission   PAST MEDICAL HISTORY:   Past Medical History:  Diagnosis Date  . Anginal pain (HCC)   . Automatic implantable cardioverter-defibrillator in situ    a. s/p MDT ICD 07/2012; b. SN # [id number]   . Chronic systolic CHF (congestive heart failure) (HCC)    a. echo 2014: EF 25%, diffuse HK, LA moderately dilated, RA mildly dilated, PASP 38 mm Hg; b. echo 08/2014: EF 25-30%, diffuse HK, RWMA cannot be excluded, mild MR, mild biatrial enlargement, RV mildly dilated,  wall thickness nl, pacer wire/catheter noted, mild to mod TR, PASP high nl  . Coronary artery disease, non-occlusive    a. cath 2003: LM nl, mid LAD 50%, LCx nl, RCA nl b. L&RHC 08/17/2014 100% occluded mid LCx, 70% mid LAD with FFR 0.82. Medical therapy. CI 2.1. CO 4.01  . Encounter for long-term (current) use of anticoagulants   . Exertional shortness of breath   . H/O medication noncompliance   . High cholesterol   . Hypertension   . NICM (nonischemic cardiomyopathy) (HCC)    a. s/p AICD 07/22/2012  . Obesity   . OSA on CPAP    "suppose to; not often" (07/22/2012)  . Other "heavy-for-dates" infants   . PAF (paroxysmal atrial fibrillation) (HCC)    a. on warfarin--> Eliquis 08/2014    PAST SURGICAL HISTORY: Past Surgical History:  Procedure Laterality Date  . ABDOMINAL HYSTERECTOMY  ~ 1998   "laparoscopic" (07/22/2012)  . CARDIAC CATHETERIZATION  ? 1990's  . CARDIAC CATHETERIZATION N/A 08/17/2014   Procedure: Right/Left Heart Cath and Coronary Angiography;  Surgeon: Kathleene Hazel, MD;  Location: [facility] INVASIVE CV LAB;  Service: Cardiovascular;  Laterality: N/A;  . CARDIAC DEFIBRILLATOR PLACEMENT  07/22/2012   dual-chamber ICD.  Marland Kitchen IMPLANTABLE CARDIOVERTER DEFIBRILLATOR IMPLANT N/A 07/22/2012   Procedure: IMPLANTABLE CARDIOVERTER DEFIBRILLATOR IMPLANT;  Surgeon: Marinus Maw, MD;  Location: [facility] CATH LAB;  Service: Cardiovascular;  Laterality: N/A;  . TEE WITHOUT CARDIOVERSION N/A 08/21/2014   Procedure: TRANSESOPHAGEAL ECHOCARDIOGRAM (TEE);  Surgeon: Chilton Si, MD;  Location: [facility] ENDOSCOPY;  Service: Cardiovascular;  Laterality:  N/A;  . TUBAL LIGATION  ~ 1978    SOCIAL HISTORY:  Social History  Substance Use Topics  . Smoking status: Never Smoker  . Smokeless tobacco: Never Used  . Alcohol use No    FAMILY HISTORY:  Family History  Problem Relation Age of Onset  . Heart disease Mother   . Heart disease Father   . Lung disease Sister   . Diabetes Sister   . Heart  failure Brother   . Thyroid disease Neg Hx     DRUG ALLERGIES: No Known Allergies  Review of Systems  Constitutional: Negative for chills, fever and weight loss.  HENT: Negative for congestion.   Eyes: Negative for blurred vision and double vision.  Respiratory: Positive for shortness of breath. Negative for cough, sputum production and wheezing.   Cardiovascular: Positive for chest pain, palpitations and leg swelling. Negative for orthopnea and PND.  Gastrointestinal: Negative for abdominal pain, blood in stool, constipation, diarrhea, nausea and vomiting.  Genitourinary: Negative for dysuria, frequency, hematuria and urgency.  Musculoskeletal: Negative for falls.  Neurological: Negative for dizziness, tremors, focal weakness and headaches.  Endo/Heme/Allergies: Does not bruise/bleed easily.  Psychiatric/Behavioral: Negative for depression. The patient does not have insomnia.     MEDICATIONS AT HOME:  Prior to Admission medications   Medication Sig Start Date End Date Taking? Authorizing Provider  apixaban (ELIQUIS) 5 MG TABS tablet Take 1 tablet (5 mg total) by mouth 2 (two) times daily. 11/12/14  Yes Wendall Stade, MD  carvedilol (COREG) 6.25 MG tablet Take 1 tablet (6.25 mg total) by mouth 2 (two) times daily with a meal. 03/04/15  Yes Gollan, Tollie Pizza, MD  furosemide (LASIX) 80 MG tablet Take 1 tablet (80 mg total) by mouth 2 (two) times daily. 12/20/14  Yes Meng, Wynema Birch, PA  methimazole (TAPAZOLE) 10 MG tablet TAKE ONE TABLET BY MOUTH TWICE DAILY 03/21/15  Yes Romero Belling, MD  sacubitril-valsartan (ENTRESTO) 24-26 MG Take 1 tablet by mouth 2 (two) times daily. 02/04/15  Yes Hackney, Inetta Fermo A, FNP  spironolactone (ALDACTONE) 25 MG tablet Take 0.5 tablets (12.5 mg total) by mouth daily. APPOINTMENT NEEDED 06/28/15  Yes Duke Salvia, MD  amiodarone (PACERONE) 200 MG tablet TAKE ONE TABLET BY MOUTH ONCE DAILY *CALL OFFICE AND SCHEDULE APPOINTMENT TO SEE DOCTOR BEFORE ANY FUTURE REFILLS  WILL BE SENT* 04/30/15   Duke Salvia, MD  KLOR-CON M20 20 MEQ tablet Take 1 tablet (20 mEq total) by mouth daily. APPOINTMENT NEEDED Patient not taking: Reported on 08/19/2016 06/28/15   Duke Salvia, MD      PHYSICAL EXAMINATION:   VITAL SIGNS: Blood pressure (!) 159/102, pulse (!) 101, temperature 98.9 F (37.2 C), temperature source Oral, resp. rate (!) 23, height 5' (1.524 m), weight 99.8 kg (220 lb), SpO2 92 %.  GENERAL:  71 y.o.-year-old patient lying in the beIn moderate respiratory distress, on BiPAP, tachypneic, uncomfortable.  EYES: Pupils equal, round, reactive to light and accommodation. No scleral icterus. Extraocular muscles intact.  HEENT: Head atraumatic, normocephalic. Oropharynx and nasopharynx clear.  NECK:  Supple, no jugular venous distention. No thyroid enlargement, no tenderness.  LUNGSSome diminished breath sounds bilaterally, no wheezing, rales,rhonchi or crepitations.  Using  accessory muscles of respiration, On BiPAP, tachypneic, dyspneic.  CARDIOVASCULAR: S1, S2 . Rhythm was regular . No murmurs, rubs, or gallops.  ABDOMEN: Soft, nontender, nondistended. Bowel sounds present. No organomegaly or mass.  EXTREMITIES:  1+ lower extremity and  pedal edema, , no cyanosis, or clubbing.  ,  no calf tenderness bilaterally CNS: Cranial nerves II through XII are intact. Muscle strength 5/5 in all extremities. Sensation intact. Gait not checked.  PSYCHIATRIC: The patient is alert and oriented x 3.  SKIN: No obvious rash, lesion, or ulcer.   LABORATORY PANEL:   CBC  Recent Labs Lab 08/19/16 1509  WBC 8.6  HGB 12.7  HCT 39.7  PLT 361  MCV 75.0*  MCH 24.0*  MCHC 32.0  RDW 17.1*  LYMPHSABS 3.0  MONOABS 1.1*  EOSABS 0.4  BASOSABS 0.1   ------------------------------------------------------------------------------------------------------------------  Chemistries   Recent Labs Lab 08/19/16 1509  NA 139  K 3.4*  CL 105  CO2 25  GLUCOSE 114*  BUN 17    CREATININE 1.27*  CALCIUM 9.0   ------------------------------------------------------------------------------------------------------------------  Cardiac Enzymes  Recent Labs Lab 08/19/16 1509  TROPONINI 0.39*   ------------------------------------------------------------------------------------------------------------------  RADIOLOGY: Dg Chest 1 View  Result Date: 08/19/2016 CLINICAL DATA:  Shortness of breath. EXAM: CHEST 1 VIEW COMPARISON:  Chest x-ray dated August 13, 2014. FINDINGS: Stable cardiomegaly. Left chest wall AICD with leads terminating in the right atrium and right ventricle is unchanged. Mild bibasilar atelectasis. No focal consolidation, pleural effusion, or pneumothorax. No acute osseous abnormality. IMPRESSION: Stable cardiomegaly.  No active cardiopulmonary disease. Electronically Signed   By: Obie Dredge M.D.   On: 08/19/2016 15:36    EKG: Orders placed or performed during the hospital encounter of 08/19/16  . EKG 12-Lead  . EKG 12-Lead   . EKG in the emergency room reveals sinus tachycardia at a rate of 115 bpm, and a specific ST-T changes. Interrogation of a CT revealed V. tach/V. fib episode terminated by electrical shock, lasting more than 30 seconds, recent V. tach episodes lasting more than 5 minutes   IMPRESSION AND PLAN:  Active Problems:   Acute respiratory failure with hypoxia (HCC)   Ventricular fibrillation (HCC)   Demand ischemia (HCC)   CKD (chronic kidney disease) stage 3, GFR 30-59 ml/min  #1. Acute respiratory failure with hypoxia, continue patient on BiPAP, suspect congestive heart failure, although chest x-ray shows no significant abnormalities, continue patient on Lasix, oxygen, BiPAP, follow clinically, wean off oxygen as tolerated   #2 ventricular fibrillation/V. tach, terminated by electrical shock, continue patient on amiodarone, get magnesium level, supplement as needed, supplement potassium, get cardiologist involved for  further recommendations #3 demand ischemia, follow cardiac enzymes 3. Continue patient on Coreg, aspirin, nitroglycerin, cardiology consultation is pending #4. CK D stage III, followed with diuresis, therapy #5. Hyperglycemia, check hemoglobin A1c to rule out diabetes   All the records are reviewed and case discussed with ED provider. Management plans discussed with the patient, family and they are in agreement.  CODE STATUS: Code Status History    Date Active Date Inactive Code Status Order ID Comments User Context   08/17/2014  2:10 PM 08/22/2014  8:57 PM Full Code [id number]  Kathleene Hazel, MD Inpatient   08/15/2014 10:15 PM 08/17/2014  2:10 PM Full Code [id number]  Quintella Reichert, MD Inpatient   08/13/2014  6:23 PM 08/15/2014 10:15 PM Full Code [id number]  Houston Siren, MD Inpatient       TOTAL TIME TAKING CARE OF THIS PATIENT: 50 minutes.    Katharina Caper M.D on 08/19/2016 at 4:37 PM  Between 7am to 6pm - Pager - [phone] After 6pm go to [email or web address] - password [email or web address]  [facility]  Office  [phone]  CC: Primary care physician; Patient, No Pcp Per

## 2016-08-19 NOTE — Consult Note (Signed)
Name: Erin [name] MRN: [id number] DOB: [birth year]    ADMISSION DATE:  08/19/2016 CONSULTATION DATE: 08/19/2016  REFERRING MD : Dr. Winona Legato  CHIEF COMPLAINT: Weakness  BRIEF PATIENT DESCRIPTION:  71 yo female with PMH of Non ischemic cardiomyopathy s/p AICD. Shocked by AICD today for 30 seconds of VT/VFIB and on BiPAP for mild CHF and elevated troponin on amiodarone gtt and Cardiology consulted  SIGNIFICANT EVENTS  08/15-Pt admitted to the Stepdown Unit   STUDIES:  None   HISTORY OF PRESENT ILLNESS:   This is a 71 year old female with a past medical history of atrial fibrillation on Eliquis, OSA (noncompliant with use of CPAP), CKD Stage III, obesity, nonischemic cardiomyopathy status post AICD placement EF 30-35% in July 2014, hypertension, hypercholesterolemia, medication noncompliance, CAD, chronic systolic CHF, and angina. She presented to [facility] ER 08/15 with shortness of breath, weakness, and firing of AICD onset of symptoms today while shopping at a supermarket. The patient states she has been compliant with her carvedilol, however she has not taken any of her other prescribed medications such as Eliquis for weeks, missed amiodarone dose today, and has not been compliant with wearing her CPAP at night. The patient states she has ran out of her medications and has not scheduled her Cardiology follow-up. Per ER notes the patient's son-in-law stated he noticed that the patient has had increased shortness of breath over the past several weeks with exertion. The patient also endorses having to sleep sitting up with several pillows propped behind her back due to shortness of breath. In the ER the pt was noted to be in acute respiratory distress with pursed lip breathing requiring continuous Bipap. The pts pacemaker was interrogated in the ER revealing the pt did receive a shock today due to vtach/vfibb lasting 30 seconds and another arrhythmia lasting 5 minutes with an initial troponin of  0.39, therefore she was placed on an amiodarone gtt. She was subsequently admitted to the stepdown unit by hospitalist team for further workup and treatment PCCM consulted     PAST MEDICAL HISTORY :   has a past medical history of Anginal pain (HCC); Automatic implantable cardioverter-defibrillator in situ; Chronic systolic CHF (congestive heart failure) (HCC); Coronary artery disease, non-occlusive; Encounter for long-term (current) use of anticoagulants; Exertional shortness of breath; H/O medication noncompliance; High cholesterol; Hypertension; NICM (nonischemic cardiomyopathy) (HCC); Obesity; OSA on CPAP; Other "heavy-for-dates" infants; and PAF (paroxysmal atrial fibrillation) (HCC).  has a past surgical history that includes Cardiac defibrillator placement (07/22/2012); Abdominal hysterectomy (~ 1998); Tubal ligation (~ 1978); Cardiac catheterization (? 1990's); implantable cardioverter defibrillator implant (N/A, 07/22/2012); Cardiac catheterization (N/A, 08/17/2014); and TEE without cardioversion (N/A, 08/21/2014). Prior to Admission medications   Medication Sig Start Date End Date Taking? Authorizing Provider  apixaban (ELIQUIS) 5 MG TABS tablet Take 1 tablet (5 mg total) by mouth 2 (two) times daily. 11/12/14  Yes Wendall Stade, MD  carvedilol (COREG) 6.25 MG tablet Take 1 tablet (6.25 mg total) by mouth 2 (two) times daily with a meal. 03/04/15  Yes Gollan, Tollie Pizza, MD  furosemide (LASIX) 80 MG tablet Take 1 tablet (80 mg total) by mouth 2 (two) times daily. 12/20/14  Yes Meng, Wynema Birch, PA  methimazole (TAPAZOLE) 10 MG tablet TAKE ONE TABLET BY MOUTH TWICE DAILY 03/21/15  Yes Romero Belling, MD  sacubitril-valsartan (ENTRESTO) 24-26 MG Take 1 tablet by mouth 2 (two) times daily. 02/04/15  Yes Clarisa Kindred A, FNP  spironolactone (ALDACTONE) 25 MG tablet Take 0.5 tablets (12.5 mg  total) by mouth daily. APPOINTMENT NEEDED 06/28/15  Yes Duke Salvia, MD  amiodarone (PACERONE) 200 MG tablet TAKE ONE  TABLET BY MOUTH ONCE DAILY *CALL OFFICE AND SCHEDULE APPOINTMENT TO SEE DOCTOR BEFORE ANY FUTURE REFILLS WILL BE SENT* 04/30/15   Duke Salvia, MD  KLOR-CON M20 20 MEQ tablet Take 1 tablet (20 mEq total) by mouth daily. APPOINTMENT NEEDED Patient not taking: Reported on 08/19/2016 06/28/15   Duke Salvia, MD   No Known Allergies  FAMILY HISTORY:  family history includes Diabetes in her sister; Heart disease in her father and mother; Heart failure in her brother; Lung disease in her sister. SOCIAL HISTORY:  reports that she has never smoked. She has never used smokeless tobacco. She reports that she does not drink alcohol or use drugs.  REVIEW OF SYSTEMS: Positive in BOLD  Constitutional: Negative for fever, chills, weight loss, malaise/fatigue and diaphoresis.  HENT: Negative for hearing loss, ear pain, nosebleeds, congestion, sore throat, neck pain, tinnitus and ear discharge.   Eyes: Negative for blurred vision, double vision, photophobia, pain, discharge and redness.  Respiratory: cough, hemoptysis, sputum production, shortness of breath, wheezing and stridor.   Cardiovascular: intermittent chest pain, palpitations, orthopnea, claudication, leg swelling and PND.  Gastrointestinal: Negative for heartburn, nausea, vomiting, abdominal pain, diarrhea, constipation, blood in stool and melena.  Genitourinary: Negative for dysuria, urgency, frequency, hematuria and flank pain.  Musculoskeletal: Negative for myalgias, back pain, joint pain and falls.  Skin: Negative for itching and rash.  Neurological: dizziness, tingling, tremors, sensory change, speech change, focal weakness, seizures, loss of consciousness, weakness and headaches.  Endo/Heme/Allergies: Negative for environmental allergies and polydipsia. Does not bruise/bleed easily.  SUBJECTIVE:  No complaints at this time  VITAL SIGNS: Temp:  [98.9 F (37.2 C)] 98.9 F (37.2 C) (08/15 1505) Pulse Rate:  [98-114] 101 (08/15  1619) Resp:  [20-34] 23 (08/15 1619) BP: (155-187)/(89-105) 159/102 (08/15 1615) SpO2:  [90 %-96 %] 92 % (08/15 1619) Weight:  [99.8 kg (220 lb)] 99.8 kg (220 lb) (08/15 1500)  PHYSICAL EXAMINATION: General: Well-developed, well-nourished AA female Neuro: Alert and oriented, follows commands HEENT: Supple, no JVD Cardiovascular: Sinus tach with occasional PVCs, no M/R/G Lungs: Diminished throughout, slightly labored and tachypneic; no wheezes, rales, crackles, or rhonchi  Abdomen: +BS x4, soft, non tender, non distended Musculoskeletal: normal bulk and tone, no edema  Skin: scabbed over abrasions bilateral shins  Recent Labs Lab 08/19/16 1509  NA 139  K 3.4*  CL 105  CO2 25  BUN 17  CREATININE 1.27*  GLUCOSE 114*    Recent Labs Lab 08/19/16 1509  HGB 12.7  HCT 39.7  WBC 8.6  PLT 361   Dg Chest 1 View  Result Date: 08/19/2016 CLINICAL DATA:  Shortness of breath. EXAM: CHEST 1 VIEW COMPARISON:  Chest x-ray dated August 13, 2014. FINDINGS: Stable cardiomegaly. Left chest wall AICD with leads terminating in the right atrium and right ventricle is unchanged. Mild bibasilar atelectasis. No focal consolidation, pleural effusion, or pneumothorax. No acute osseous abnormality. IMPRESSION: Stable cardiomegaly.  No active cardiopulmonary disease. Electronically Signed   By: Obie Dredge M.D.   On: 08/19/2016 15:36    ASSESSMENT / PLAN: Ventricular fibrillation/tachycardia  Acute on chronic respiratory failure secondary to mild CHF exacerbation Elevated troponin's secondary to demand ischemia vs. NSTEMI Hypertension Hx: Atrial fibrillation, OSA noncompliant with CPAP qhs, Obesity, Hypercholesteremia, AICD, and CAD P: Supplemental O2 to maintain O2 sats greater than 92% Prn bronchodilator therapy CPAP qhs Prn  chest x-ray Continue amiodarone and heparin gtts Continue aspirin, carvedilol, furosemide, nitroglycerin, adactone, and entresto Cardiology consulted appreciate  input Continuous telemetry monitoring Echo pending Trend troponin's Trend BMP Replace electrolytes as indicated Monitor urinary output TSH and PT/INR pending Trend CBC Monitor for s/sx of bleeding Educated patient regarding medication compliance  Sonda Rumble, AGNP  Pulmonary/Critical Care Pager [phone] (please enter 7 digits) PCCM Consult Pager [phone] (please enter 7 digits)   STAFF NOTE: I. Dr. Nicholos Johns, have personally reviewed the patient's available data including medical history , events of notes, physican examination and test results as part of my evaluation. I have discussed with the  Care with the NP and other care providers including  pharmacist, ICU RN, RRT, dietary. Patient is awake and alert this morning, she has remained on amiodarone. No further shocks by AICD overnight. Acute kidney injury has improved, creatinine has decreased from 1.27-1.13, consistent with her history of CKD3. I personally reviewed. Chest x-ray images, shows cardiomegaly, bibasilar atelectasis, mild pulmonary edema.  Physical Exam Lungs - Decreased air entry bilaterally.   Currently on heparin, Continue management per cardiology recommendations. Continue Lasix.  Wells Guiles, MD.   Board Certified in Internal Medicine, Pulmonary Medicine, Critical Care Medicine, and Sleep Medicine.  [facility] Office Number: [phone] Pager: [phone]  Santiago Glad, M.D.  Billy Fischer, M.D

## 2016-08-19 NOTE — ED Notes (Signed)
Dr. Schavitz notified of critical trop 

## 2016-08-19 NOTE — ED Triage Notes (Signed)
Pt arrives via EMS, pt was at [employer] shopping and states she began to feel weak and then felt a "zap" in her chest, denies any LOC, states she has a defibrillator in place, pt states she has not been taking her medications as prescribed, pts skin moist upon arrival, awake and alert, states SOB

## 2016-08-20 DIAGNOSIS — I5023 Acute on chronic systolic (congestive) heart failure: Secondary | ICD-10-CM

## 2016-08-20 DIAGNOSIS — I248 Other forms of acute ischemic heart disease: Secondary | ICD-10-CM

## 2016-08-20 DIAGNOSIS — Z95810 Presence of automatic (implantable) cardiac defibrillator: Secondary | ICD-10-CM

## 2016-08-20 DIAGNOSIS — J9601 Acute respiratory failure with hypoxia: Secondary | ICD-10-CM

## 2016-08-20 DIAGNOSIS — Z9114 Patient's other noncompliance with medication regimen: Secondary | ICD-10-CM

## 2016-08-20 DIAGNOSIS — Z4502 Encounter for adjustment and management of automatic implantable cardiac defibrillator: Secondary | ICD-10-CM

## 2016-08-20 DIAGNOSIS — I480 Paroxysmal atrial fibrillation: Secondary | ICD-10-CM

## 2016-08-20 DIAGNOSIS — I472 Ventricular tachycardia, unspecified: Secondary | ICD-10-CM

## 2016-08-20 DIAGNOSIS — Z7189 Other specified counseling: Secondary | ICD-10-CM

## 2016-08-20 DIAGNOSIS — I25118 Atherosclerotic heart disease of native coronary artery with other forms of angina pectoris: Secondary | ICD-10-CM

## 2016-08-20 LAB — BASIC METABOLIC PANEL
ANION GAP: 7 (ref 5–15)
BUN: 16 mg/dL (ref 6–20)
CALCIUM: 8.5 mg/dL — AB (ref 8.9–10.3)
CO2: 30 mmol/L (ref 22–32)
CREATININE: 1.13 mg/dL — AB (ref 0.44–1.00)
Chloride: 106 mmol/L (ref 101–111)
GFR calc non Af Amer: 48 mL/min — ABNORMAL LOW (ref >60)
GFR, EST AFRICAN AMERICAN: 55 mL/min — AB (ref >60)
Glucose, Bld: 101 mg/dL — ABNORMAL HIGH (ref 65–99)
Potassium: 4.1 mmol/L (ref 3.5–5.1)
SODIUM: 143 mmol/L (ref 135–145)

## 2016-08-20 LAB — CBC
HCT: 36.4 % (ref 35.0–47.0)
HEMOGLOBIN: 12 g/dL (ref 12.0–16.0)
MCH: 25.1 pg — ABNORMAL LOW (ref 26.0–34.0)
MCHC: 32.9 g/dL (ref 32.0–36.0)
MCV: 76.2 fL — ABNORMAL LOW (ref 80.0–100.0)
Platelets: 302 10*3/uL (ref 150–440)
RBC: 4.78 MIL/uL (ref 3.80–5.20)
RDW: 17.4 % — ABNORMAL HIGH (ref 11.5–14.5)
WBC: 7.6 10*3/uL (ref 3.6–11.0)

## 2016-08-20 LAB — HEPARIN LEVEL (UNFRACTIONATED)
Heparin Unfractionated: 0.44 IU/mL (ref 0.30–0.70)
Heparin Unfractionated: 0.34 IU/mL (ref 0.30–0.70)

## 2016-08-20 LAB — ECHOCARDIOGRAM COMPLETE
Height: 60 in
WEIGHTICAEL: 3301.61 [oz_av]

## 2016-08-20 LAB — T4, FREE: Free T4: 1.47 ng/dL — ABNORMAL HIGH (ref 0.61–1.12)

## 2016-08-20 LAB — TROPONIN I
TROPONIN I: 0.34 ng/mL — AB (ref <0.03)
Troponin I: 0.36 ng/mL (ref <0.03)

## 2016-08-20 MED ORDER — AMIODARONE HCL 200 MG PO TABS
400.0000 mg | ORAL_TABLET | Freq: Two times a day (BID) | ORAL | Status: DC
  Administered 2016-08-20 – 2016-08-25 (×9): 400 mg via ORAL
  Filled 2016-08-20 (×10): qty 2

## 2016-08-20 MED ORDER — AMIODARONE HCL 200 MG PO TABS
ORAL_TABLET | ORAL | Status: AC
  Filled 2016-08-20: qty 2

## 2016-08-20 MED ORDER — IPRATROPIUM-ALBUTEROL 0.5-2.5 (3) MG/3ML IN SOLN
RESPIRATORY_TRACT | Status: AC
  Filled 2016-08-20: qty 3

## 2016-08-20 MED ORDER — HEPARIN BOLUS VIA INFUSION
1400.0000 [IU] | Freq: Once | INTRAVENOUS | Status: AC
  Administered 2016-08-20: 1400 [IU] via INTRAVENOUS
  Filled 2016-08-20: qty 1400

## 2016-08-20 MED ORDER — ATORVASTATIN CALCIUM 20 MG PO TABS
40.0000 mg | ORAL_TABLET | Freq: Every day | ORAL | Status: DC
  Administered 2016-08-20 – 2016-08-24 (×5): 40 mg via ORAL
  Filled 2016-08-20 (×5): qty 2

## 2016-08-20 NOTE — Discharge Instructions (Signed)
Heart Failure Clinic appointment on August 27 2016 at 1:00pm with Clarisa Kindred, FNP. Please call [phone] to reschedule.

## 2016-08-20 NOTE — Progress Notes (Signed)
ANTICOAGULATION CONSULT NOTE  Pharmacy Consult for heparin drip Indication: chest pain/ACS  No Known Allergies  Patient Measurements: Height: 5' (152.4 cm) Weight: 206 lb 5.6 oz (93.6 kg) IBW/kg (Calculated) : 45.5 Heparin Dosing Weight: 69.7kg  Vital Signs: Temp: 97.8 F (36.6 C) (08/16 0745) Temp Source: Oral (08/16 0745) BP: 117/83 (08/16 0900) Pulse Rate: 77 (08/16 0900)  Labs:  Recent Labs  08/19/16 1509 08/19/16 1811 08/19/16 2328 08/20/16 0513 08/20/16 0816  HGB 12.7  --   --  12.0  --   HCT 39.7  --   --  36.4  --   PLT 361  --   --  302  --   APTT 29  --   --   --   --   LABPROT 14.0 13.8  --   --   --   INR 1.08 1.06  --   --   --   HEPARINUNFRC <0.10*  --  0.28*  --  0.44  CREATININE 1.27*  --   --  1.13*  --   TROPONINI 0.39* 0.37* 0.36* 0.34*  --     Estimated Creatinine Clearance: 46.6 mL/min (A) (by C-G formula based on SCr of 1.13 mg/dL (H)).   Medical History: Past Medical History:  Diagnosis Date  . Anginal pain (HCC)   . Automatic implantable cardioverter-defibrillator in situ    a. s/p MDT ICD 07/2012; b. SN # [id number]   . Chronic systolic CHF (congestive heart failure) (HCC)    a. echo 2014: EF 25%, diffuse HK, LA moderately dilated, RA mildly dilated, PASP 38 mm Hg; b. echo 08/2014: EF 25-30%, diffuse HK, RWMA cannot be excluded, mild MR, mild biatrial enlargement, RV mildly dilated, wall thickness nl, pacer wire/catheter noted, mild to mod TR, PASP high nl  . Coronary artery disease, non-occlusive    a. cath 2003: LM nl, mid LAD 50%, LCx nl, RCA nl b. L&RHC 08/17/2014 100% occluded mid LCx, 70% mid LAD with FFR 0.82. Medical therapy. CI 2.1. CO 4.01  . Encounter for long-term (current) use of anticoagulants   . Exertional shortness of breath   . H/O medication noncompliance   . High cholesterol   . Hypertension   . NICM (nonischemic cardiomyopathy) (HCC)    a. s/p AICD 07/22/2012  . Obesity   . OSA on CPAP    "suppose to; not often"  (07/22/2012)  . Other "heavy-for-dates" infants   . PAF (paroxysmal atrial fibrillation) (HCC)    a. on warfarin--> Eliquis 08/2014    Medications:  Scheduled:  . amiodarone      . amiodarone  400 mg Oral BID  . aspirin EC  81 mg Oral Daily  . atorvastatin  40 mg Oral q1800  . carvedilol  6.25 mg Oral BID WC  . furosemide  80 mg Oral BID  . ipratropium-albuterol  3 mL Nebulization Q6H  . mouth rinse  15 mL Mouth Rinse BID  . methimazole  10 mg Oral BID  . nitroGLYCERIN  1 inch Topical Q6H  . potassium chloride SA  20 mEq Oral Daily  . sacubitril-valsartan  1 tablet Oral BID  . sodium chloride flush  3 mL Intravenous Q12H  . spironolactone  12.5 mg Oral Daily    Assessment: Patient is a 71 year old female w/ a PMH of CAD, CHF, Afib on eliquis, ICD who presents with SOB, "weakness in my chest." patient first troponin is elevated at 0.39. Pharmacy consulted to dose heparin drip. Patient  off eliquis since at least Sunday (8/12).  Goal of Therapy:  Heparin level 0.3-0.7 units/ml aPTT 66-102 seconds Monitor platelets by anticoagulation protocol: Yes   Plan:  Continue patient's heparin at 1000 units/hr. Heparin level ordered for 8/16 at 1630. Per cardiology note, plan is to discharge patient on apixaban (eliquis).  Erskine Emery, PharmD Student 08/20/2016

## 2016-08-20 NOTE — Progress Notes (Signed)
    Spoke with [employer] rep. She prefers to not adjust the patient's ICD setting until she can discuss with Dr. Graciela Husbands. She will contact him and get back with [place] to notify of a final decision made by EP. Dr. Mariah Milling was notified of this.

## 2016-08-20 NOTE — Consult Note (Signed)
ANTICOAGULATION CONSULT NOTE - Initial Consult  Pharmacy Consult for heparin drip Indication: chest pain/ACS  No Known Allergies  Patient Measurements: Height: 5' (152.4 cm) Weight: 206 lb 5.6 oz (93.6 kg) IBW/kg (Calculated) : 45.5 Heparin Dosing Weight: 69.7kg  Vital Signs: Temp: 97.6 F (36.4 C) (08/16 0000) Temp Source: Axillary (08/16 0000) BP: 114/85 (08/16 0000) Pulse Rate: 76 (08/16 0000)  Labs:  Recent Labs  08/19/16 1509 08/19/16 1811 08/19/16 2328  HGB 12.7  --   --   HCT 39.7  --   --   PLT 361  --   --   APTT 29  --   --   LABPROT 14.0 13.8  --   INR 1.08 1.06  --   HEPARINUNFRC <0.10*  --  0.28*  CREATININE 1.27*  --   --   TROPONINI 0.39* 0.37* 0.36*    Estimated Creatinine Clearance: 41.5 mL/min (A) (by C-G formula based on SCr of 1.27 mg/dL (H)).   Medical History: Past Medical History:  Diagnosis Date  . Anginal pain (HCC)   . Automatic implantable cardioverter-defibrillator in situ    a. s/p MDT ICD 07/2012; b. SN # [id number]   . Chronic systolic CHF (congestive heart failure) (HCC)    a. echo 2014: EF 25%, diffuse HK, LA moderately dilated, RA mildly dilated, PASP 38 mm Hg; b. echo 08/2014: EF 25-30%, diffuse HK, RWMA cannot be excluded, mild MR, mild biatrial enlargement, RV mildly dilated, wall thickness nl, pacer wire/catheter noted, mild to mod TR, PASP high nl  . Coronary artery disease, non-occlusive    a. cath 2003: LM nl, mid LAD 50%, LCx nl, RCA nl b. L&RHC 08/17/2014 100% occluded mid LCx, 70% mid LAD with FFR 0.82. Medical therapy. CI 2.1. CO 4.01  . Encounter for long-term (current) use of anticoagulants   . Exertional shortness of breath   . H/O medication noncompliance   . High cholesterol   . Hypertension   . NICM (nonischemic cardiomyopathy) (HCC)    a. s/p AICD 07/22/2012  . Obesity   . OSA on CPAP    "suppose to; not often" (07/22/2012)  . Other "heavy-for-dates" infants   . PAF (paroxysmal atrial fibrillation) (HCC)    a. on warfarin--> Eliquis 08/2014    Medications:  Scheduled:  . aspirin EC  81 mg Oral Daily  . carvedilol  6.25 mg Oral BID WC  . furosemide  80 mg Oral BID  . heparin  1,400 Units Intravenous Once  . ipratropium-albuterol  3 mL Nebulization Q6H  . mouth rinse  15 mL Mouth Rinse BID  . methimazole  10 mg Oral BID  . nitroGLYCERIN  1 inch Topical Q6H  . potassium chloride SA  20 mEq Oral Daily  . sacubitril-valsartan  1 tablet Oral BID  . sodium chloride flush  3 mL Intravenous Q12H  . spironolactone  12.5 mg Oral Daily    Assessment: Patient is a 71 year old female w/ a PMH of CAD, CHF, Afib on eliquis, ICD who presents with SOB, "weakness in my chest." patient first troponin is elevated at 0.39. Pharmacy consulted to dose heparin drip. Patient states that she cant remember the last time she took her eliquis, but she knows that it hasn't been since at least Sunday.   Goal of Therapy:  Heparin level 0.3-0.7 units/ml aPTT 66-102 seconds Monitor platelets by anticoagulation protocol: Yes   Plan:  I will order a baseline HL, APTT and INR. It has been almost  72 since last dose, we believe. But if it is elevated, will dose off of APTT until they corollate. Give 4000 units bolus x 1 Start heparin infusion at 800 units/hr Check anti-Xa level in 8 hours and daily while on heparin Continue to monitor H&H and platelets   8/15 @ 2330 HL 0.28 subtherapeutic. Will rebolus w/ heparin 1400 units IV x 1 and will increase rate to 1000 units/hr and will recheck HL and CBC @ 0800.  Thomasene Ripple, PharmD, BCPS Clinical Pharmacist 08/20/2016

## 2016-08-20 NOTE — Care Management (Signed)
RNCM met with patient to discuss consult for medication assistance. Patient's sister was at bedside so patient permission was requested and received before assessing this need. Patient states "elivil is 45$" and so are most of the other medications however she states "she will have to give sometime up to be able to afford these medications".  I provided Good Rx card for her to check with other pharmacies and she states/sister states she will be able to do this on her own.  No other RNCM needs. Patient has health insurance and drug coverage. Advised patient to contact her insurance drug plan to discuss the burden- she agreed.

## 2016-08-20 NOTE — Consult Note (Signed)
Cardiology Consultation Note  Patient ID: Erin [name], MRN: [id number], DOB/AGE: 1945/02/09 71 y.o. Admit date: 08/19/2016   Date of Consult: 08/20/2016 Primary Physician: Patient, No Pcp Per Primary Cardiologist: Dr. Graciela Husbands, MD Requesting Physician: Dr. Winona Legato, MD  Chief Complaint: Weakness Reason for Consult: ICD shock  HPI: Erin [name] is a 71 y.o. female who is being seen today for the evaluation of ICD shock at the request of Dr. Winona Legato, MD. Patient has a h/o CAD medically managed, NICM/chronic systolic CHF s/p MDT ICD in 07/2012, PAF on Eliquis and amiodarone, pulmonary hypertension, hyperthyroidism on methimazole, HTN, HLD, OSA noncompliant with CPAP who presented to [facility] with generalized weakness followed by ICD shock.   Prior LHC in 2003 showed normal left main, mid LAD 50% stenosed, LCx normal, RCA normal. R/LHC in 08/2014 showed 100% occluded mid LCx, mid LAD 70% with FFR 0.82, CI 2.1, CO 4.01, medical therapy was advised. Prior EF of 25% by echo in 05/2012. Hospitalized in 08/2014 with acute CHF in the context of Afib with RVR. Echo at that time demonstrated an EF of 25% and enlargement of the left atrium. She was transferred to [facility] where she underwent cardiac cath the demonstrated occlusion of the mid LCx and moderate LAD stenosis with a negative FFR as above. Her INR had been subtherapeutic, thus she underwent TEE prior to possible DCCV that showed a left atrial thrombus. Her Coumadin was transitioned to Eliquis during that admission. She has been lost to follow up since 2016.   Patient and her daughter report the patient has been out of medications for the past 3 weeks outside of her amiodarone in which she last took on 08/16/16. Over these past three weeks she has been noting increased SOB with minimal exertion along with orthopnea, having to use 3-4 pillows (baseline 1-2 pillows). She has also noted early satiety. No chest pain, though has described some "indigestion."  She does not weigh herself and does not check her BP at home.   Patient had gotten to the grocery store on 8/15 when her generalized weakness worsened. She began to turn around to leave when she felt a sudden shock. No LOC. No associated chest pain, though some indigestion. There was some associated diaphoresis and nausea. Never felt anything similar. The grocery store called EMS.   Upon her arrival she was noted to have BP 187/105, HR 114 bpm, temperature 98.9, oxygen saturation 94% room air, weight 1220 pounds. CXR showed stable cardiomegaly with no active cardiopulmonary disease. EKG showed sinus tachycardia, 115 bpm, lateral TWI. Labs showed a magnesium of 2.1, troponin 0.39-->0.37-->0.36-->0.34, SCr 1.27-->1.13 (baseline 1.0-1.1), K+ 3.4-->4.1, BNP 892, WBC 8.6, HGB 12.7, PLT 361. ICD was interrogated in the ED which per ED documentation showed a shock was delivered. She was bolused with IV amiodarone and started on heparin gtt. She was also given IV Lasix 60 mg x 1 and placed on an amiodarone infusion.   ICD interrogation: showed VT/VF lasting ~ 30 seconds terminated by defibrillation. Also prior episode of VT lasting ~ 5 minutes without shock delivery.   Since her admission, she has not had any further sustained runs of ventricular ectopy, though has had occasional PVCs on telemetry.   Past Medical History:  Diagnosis Date  . Anginal pain (HCC)   . Automatic implantable cardioverter-defibrillator in situ    a. s/p MDT ICD 07/2012; b. SN # [id number]   . Chronic systolic CHF (congestive heart failure) (HCC)    a.  echo 2014: EF 25%, diffuse HK, LA moderately dilated, RA mildly dilated, PASP 38 mm Hg; b. echo 08/2014: EF 25-30%, diffuse HK, RWMA cannot be excluded, mild MR, mild biatrial enlargement, RV mildly dilated, wall thickness nl, pacer wire/catheter noted, mild to mod TR, PASP high nl  . Coronary artery disease, non-occlusive    a. cath 2003: LM nl, mid LAD 50%, LCx nl, RCA nl b. L&RHC  08/17/2014 100% occluded mid LCx, 70% mid LAD with FFR 0.82. Medical therapy. CI 2.1. CO 4.01  . Encounter for long-term (current) use of anticoagulants   . Exertional shortness of breath   . H/O medication noncompliance   . High cholesterol   . Hypertension   . NICM (nonischemic cardiomyopathy) (HCC)    a. s/p AICD 07/22/2012  . Obesity   . OSA on CPAP    "suppose to; not often" (07/22/2012)  . Other "heavy-for-dates" infants   . PAF (paroxysmal atrial fibrillation) (HCC)    a. on warfarin--> Eliquis 08/2014      Most Recent Cardiac Studies: As above   Surgical History:  Past Surgical History:  Procedure Laterality Date  . ABDOMINAL HYSTERECTOMY  ~ 1998   "laparoscopic" (07/22/2012)  . CARDIAC CATHETERIZATION  ? 1990's  . CARDIAC CATHETERIZATION N/A 08/17/2014   Procedure: Right/Left Heart Cath and Coronary Angiography;  Surgeon: Kathleene Hazel, MD;  Location: [facility] INVASIVE CV LAB;  Service: Cardiovascular;  Laterality: N/A;  . CARDIAC DEFIBRILLATOR PLACEMENT  07/22/2012   dual-chamber ICD.  Marland Kitchen IMPLANTABLE CARDIOVERTER DEFIBRILLATOR IMPLANT N/A 07/22/2012   Procedure: IMPLANTABLE CARDIOVERTER DEFIBRILLATOR IMPLANT;  Surgeon: Marinus Maw, MD;  Location: [facility] CATH LAB;  Service: Cardiovascular;  Laterality: N/A;  . TEE WITHOUT CARDIOVERSION N/A 08/21/2014   Procedure: TRANSESOPHAGEAL ECHOCARDIOGRAM (TEE);  Surgeon: Chilton Si, MD;  Location: [facility] ENDOSCOPY;  Service: Cardiovascular;  Laterality: N/A;  . TUBAL LIGATION  ~ 1978     Home Meds: Prior to Admission medications   Medication Sig Start Date End Date Taking? Authorizing Provider  apixaban (ELIQUIS) 5 MG TABS tablet Take 1 tablet (5 mg total) by mouth 2 (two) times daily. 11/12/14  Yes Wendall Stade, MD  carvedilol (COREG) 6.25 MG tablet Take 1 tablet (6.25 mg total) by mouth 2 (two) times daily with a meal. 03/04/15  Yes Gollan, Tollie Pizza, MD  furosemide (LASIX) 80 MG tablet Take 1 tablet (80 mg total) by mouth 2 (two)  times daily. 12/20/14  Yes Meng, Wynema Birch, PA  methimazole (TAPAZOLE) 10 MG tablet TAKE ONE TABLET BY MOUTH TWICE DAILY 03/21/15  Yes Romero Belling, MD  sacubitril-valsartan (ENTRESTO) 24-26 MG Take 1 tablet by mouth 2 (two) times daily. 02/04/15  Yes Hackney, Inetta Fermo A, FNP  spironolactone (ALDACTONE) 25 MG tablet Take 0.5 tablets (12.5 mg total) by mouth daily. APPOINTMENT NEEDED 06/28/15  Yes Duke Salvia, MD  amiodarone (PACERONE) 200 MG tablet TAKE ONE TABLET BY MOUTH ONCE DAILY *CALL OFFICE AND SCHEDULE APPOINTMENT TO SEE DOCTOR BEFORE ANY FUTURE REFILLS WILL BE SENT* 04/30/15   Duke Salvia, MD  KLOR-CON M20 20 MEQ tablet Take 1 tablet (20 mEq total) by mouth daily. APPOINTMENT NEEDED Patient not taking: Reported on 08/19/2016 06/28/15   Duke Salvia, MD    Inpatient Medications:  . aspirin EC  81 mg Oral Daily  . carvedilol  6.25 mg Oral BID WC  . furosemide  80 mg Oral BID  . ipratropium-albuterol  3 mL Nebulization Q6H  . mouth rinse  15 mL Mouth  Rinse BID  . methimazole  10 mg Oral BID  . nitroGLYCERIN  1 inch Topical Q6H  . potassium chloride SA  20 mEq Oral Daily  . sacubitril-valsartan  1 tablet Oral BID  . sodium chloride flush  3 mL Intravenous Q12H  . spironolactone  12.5 mg Oral Daily   . sodium chloride    . amiodarone 30 mg/hr (08/20/16 0016)  . heparin 1,000 Units/hr (08/20/16 0400)    Allergies: No Known Allergies  Social History   Social History  . Marital status: Single    Spouse name: N/A  . Number of children: 3  . Years of education: N/A   Occupational History  . Retired [employer]   Social History Main Topics  . Smoking status: Never Smoker  . Smokeless tobacco: Never Used  . Alcohol use No  . Drug use: No  . Sexual activity: No   Other Topics Concern  . Not on file   Social History Narrative  . No narrative on file     Family History  Problem Relation Age of Onset  . Heart disease Mother   . Heart disease Father   . Lung disease  Sister   . Diabetes Sister   . Heart failure Brother   . Thyroid disease Neg Hx      Review of Systems: Review of Systems  Constitutional: Positive for malaise/fatigue. Negative for chills, diaphoresis, fever and weight loss.  HENT: Negative for congestion.   Eyes: Negative for discharge and redness.  Respiratory: Positive for cough and shortness of breath. Negative for hemoptysis, sputum production and wheezing.   Cardiovascular: Positive for palpitations, orthopnea and leg swelling. Negative for chest pain, claudication and PND.  Gastrointestinal: Negative for abdominal pain, blood in stool, heartburn, melena, nausea and vomiting.  Genitourinary: Negative for hematuria.  Musculoskeletal: Negative for falls and myalgias.  Skin: Negative for rash.  Neurological: Positive for weakness. Negative for dizziness, tingling, tremors, sensory change, speech change, focal weakness and loss of consciousness.  Endo/Heme/Allergies: Does not bruise/bleed easily.  Psychiatric/Behavioral: Negative for substance abuse. The patient is not nervous/anxious.   All other systems reviewed and are negative.   Labs:  Recent Labs  08/19/16 1509 08/19/16 1811 08/19/16 2328 08/20/16 0513  TROPONINI 0.39* 0.37* 0.36* 0.34*   Lab Results  Component Value Date   WBC 7.6 08/20/2016   HGB 12.0 08/20/2016   HCT 36.4 08/20/2016   MCV 76.2 (L) 08/20/2016   PLT 302 08/20/2016     Recent Labs Lab 08/20/16 0513  NA 143  K 4.1  CL 106  CO2 30  BUN 16  CREATININE 1.13*  CALCIUM 8.5*  GLUCOSE 101*   Lab Results  Component Value Date   CHOL 130 08/21/2014   HDL 25 (L) 08/21/2014   LDLCALC 91 08/21/2014   TRIG 72 08/21/2014   No results found for: DDIMER  Radiology/Studies:  Dg Chest 1 View  Result Date: 08/19/2016 IMPRESSION: Stable cardiomegaly.  No active cardiopulmonary disease. Electronically Signed   By: Obie Dredge M.D.   On: 08/19/2016 15:36    EKG: Interpreted by me showed:  sinus tachycardia, 115 bpm, lateral TWI Telemetry: Interpreted by me showed: NSR, occasional PVCs, 90s bpm  Weights: Filed Weights   08/19/16 1500 08/19/16 1723  Weight: 220 lb (99.8 kg) 206 lb 5.6 oz (93.6 kg)     Physical Exam: Blood pressure 119/73, pulse 75, temperature (!) 97 F (36.1 C), temperature source Axillary, resp. rate 19, height 5' (1.524  m), weight 206 lb 5.6 oz (93.6 kg), SpO2 93 %. Body mass index is 40.3 kg/m. General: Well developed, well nourished, in no acute distress. Head: Normocephalic, atraumatic, sclera non-icteric, no xanthomas, nares are without discharge.  Neck: Negative for carotid bruits. JVD elevated. Lungs: Clear bilaterally to auscultation without wheezes, rales, or rhonchi. Breathing is unlabored. Heart: RRR with S1 S2. No murmurs, rubs, or gallops appreciated. Abdomen: Soft, non-tender, distended with normoactive bowel sounds. No hepatomegaly. No rebound/guarding. No obvious abdominal masses. Msk:  Strength and tone appear normal for age. Extremities: No clubbing or cyanosis. Trace bilateral pre-tibial edema. Distal pedal pulses are 2+ and equal bilaterally. Neuro: Alert and oriented X 3. No facial asymmetry. No focal deficit. Moves all extremities spontaneously. Psych:  Responds to questions appropriately with a normal affect.    Assessment and Plan:  Principal Problem:   Acute respiratory failure with hypoxia (HCC) Active Problems:   CORONARY ATHEROSCLEROSIS NATIVE CORONARY ARTERY   AICD (automatic cardioverter/defibrillator) present   H/O medication noncompliance   PAF (paroxysmal atrial fibrillation) (HCC)   Acute on chronic systolic CHF (congestive heart failure) (HCC)   Ventricular fibrillation (HCC)   Demand ischemia (HCC)   CKD (chronic kidney disease) stage 3, GFR 30-59 ml/min   Ventricular tachycardia (HCC)    1. Elevated troponin:  -Mildly elevated with a peak of 0.39, down trending -TTE pending -Prior LHC in 08/2014 with LAD  70% stenosed, not significant by FFR -Consider LHC prior to discharge given ICD firing and elevated troponin -Continue heparin gtt -ASA -Coreg -Start Lipitor -Check lipid panel  2. Acute on chronic systolic CHF s/p MDT ICD:  -IV Lasix with KCl repletion -Coreg, Entresto, spironolactone -TTE pending  3. VT/VF with shock delivery by ICD:  -ICD interrogation reviewed -Finish current bag of IV amiodarone followed by PO as below -Coreg -Magnesium at goal -Potassium repleted -Recommend working up abnormal thyroid function -Consider having EP evaluate  4. Hypokalemia:  -Repleted   5. Hyperthyroidism:  -TSH 4.902 -Recommend checking free T4/T3 -Continue home methimazole per IM  6. PAF:  -Remains in sinus rhythm -Continue current bag of IV amidoarone followed by transition to PO amiodarone 400 mg bid x 7 days, 200 mg bid x 7 days, then 200 mg daily thereafter -Continue Coreg -Heparin gtt as above, transition to Eliquis prior to discharge  7. AKI:  -Improving   8. Medication noncompliance: -Compliance advised   Signed, Eula Listen, PA-C [facility] Pager: [phone] 08/20/2016, 7:31 AM

## 2016-08-20 NOTE — Progress Notes (Signed)
ANTICOAGULATION CONSULT NOTE  Pharmacy Consult for heparin drip Indication: chest pain/ACS  No Known Allergies  Patient Measurements: Height: 5' (152.4 cm) Weight: 206 lb 5.6 oz (93.6 kg) IBW/kg (Calculated) : 45.5 Heparin Dosing Weight: 69.7kg  Vital Signs: Temp: 97.8 F (36.6 C) (08/16 0745) Temp Source: Oral (08/16 0745) BP: 117/83 (08/16 0900) Pulse Rate: 77 (08/16 0900)  Labs:  Recent Labs  08/19/16 1509 08/19/16 1811 08/19/16 2328 08/20/16 0513 08/20/16 0816  HGB 12.7  --   --  12.0  --   HCT 39.7  --   --  36.4  --   PLT 361  --   --  302  --   APTT 29  --   --   --   --   LABPROT 14.0 13.8  --   --   --   INR 1.08 1.06  --   --   --   HEPARINUNFRC <0.10*  --  0.28*  --  0.44  CREATININE 1.27*  --   --  1.13*  --   TROPONINI 0.39* 0.37* 0.36* 0.34*  --     Estimated Creatinine Clearance: 46.6 mL/min (A) (by C-G formula based on SCr of 1.13 mg/dL (H)).   Medical History: Past Medical History:  Diagnosis Date  . Anginal pain (HCC)   . Automatic implantable cardioverter-defibrillator in situ    a. s/p MDT ICD 07/2012; b. SN # PJN [id number]   . Chronic systolic CHF (congestive heart failure) (HCC)    a. echo 2014: EF 25%, diffuse HK, LA moderately dilated, RA mildly dilated, PASP 38 mm Hg; b. echo 08/2014: EF 25-30%, diffuse HK, RWMA cannot be excluded, mild MR, mild biatrial enlargement, RV mildly dilated, wall thickness nl, pacer wire/catheter noted, mild to mod TR, PASP high nl  . Coronary artery disease, non-occlusive    a. cath 2003: LM nl, mid LAD 50%, LCx nl, RCA nl b. L&RHC 08/17/2014 100% occluded mid LCx, 70% mid LAD with FFR 0.82. Medical therapy. CI 2.1. CO 4.01  . Encounter for long-term (current) use of anticoagulants   . Exertional shortness of breath   . H/O medication noncompliance   . High cholesterol   . Hypertension   . NICM (nonischemic cardiomyopathy) (HCC)    a. s/p AICD 07/22/2012  . Obesity   . OSA on CPAP    "suppose to; not often"  (07/22/2012)  . Other "heavy-for-dates" infants   . PAF (paroxysmal atrial fibrillation) (HCC)    a. on warfarin--> Eliquis 08/2014    Medications:  Scheduled:  . amiodarone      . amiodarone  400 mg Oral BID  . aspirin EC  81 mg Oral Daily  . atorvastatin  40 mg Oral q1800  . carvedilol  6.25 mg Oral BID WC  . furosemide  80 mg Oral BID  . ipratropium-albuterol  3 mL Nebulization Q6H  . mouth rinse  15 mL Mouth Rinse BID  . methimazole  10 mg Oral BID  . nitroGLYCERIN  1 inch Topical Q6H  . potassium chloride SA  20 mEq Oral Daily  . sacubitril-valsartan  1 tablet Oral BID  . sodium chloride flush  3 mL Intravenous Q12H  . spironolactone  12.5 mg Oral Daily    Assessment: Patient is a 71 year old female w/ a PMH of CAD, CHF, Afib on eliquis, ICD who presents with SOB, "weakness in my chest." patient first troponin is elevated at 0.39. Pharmacy consulted to dose heparin drip. Patient  off eliquis since at least Sunday (8/12). Patient currently receiving heparin at 1000 units/hr.   Goal of Therapy:  Heparin level 0.3-0.7 units/ml aPTT 66-102 seconds Monitor platelets by anticoagulation protocol: Yes   Plan:  Continue patient's heparin at 1000 units/hr. Follow up anti-Xa level with am labs.   Per cardiology note, plan is to discharge patient on apixaban (eliquis).  MLS 08/20/2016

## 2016-08-20 NOTE — Progress Notes (Signed)
Pt assessed, report given to [facility] 2A, vital signs WDL, pt walked to stretcher with little assistance. Pt has now left the floor going to room 237 and no distress.

## 2016-08-20 NOTE — Progress Notes (Signed)
    Discussed with MDT rep. They will come out for device settings change to decrease therapy range to ~ 165-170 bpm.

## 2016-08-20 NOTE — Progress Notes (Addendum)
[facility] - [place] at [facility]   PATIENT NAME: Kimmora Risenhoover    MR#:  [id number]  DATE OF BIRTH:  [birth year]  SUBJECTIVE:  CHIEF COMPLAINT:   Chief Complaint  Patient presents with  . Weakness   Better cough and shortness breath, Off BiPAP, on [place] oxygen 2 L REVIEW OF SYSTEMS:  Review of Systems  Constitutional: Positive for weight loss. Negative for chills, fever and malaise/fatigue.  HENT: Negative for sore throat.   Eyes: Negative for blurred vision and double vision.  Respiratory: Positive for cough and shortness of breath. Negative for hemoptysis, wheezing and stridor.   Cardiovascular: Negative for chest pain, palpitations, orthopnea and leg swelling.  Gastrointestinal: Negative for abdominal pain, blood in stool, diarrhea, melena, nausea and vomiting.  Genitourinary: Negative for dysuria, flank pain and hematuria.  Musculoskeletal: Negative for back pain and joint pain.  Neurological: Positive for weakness. Negative for dizziness, sensory change, focal weakness, seizures, loss of consciousness and headaches.  Endo/Heme/Allergies: Negative for polydipsia.  Psychiatric/Behavioral: Negative for depression. The patient is not nervous/anxious.     DRUG ALLERGIES:  No Known Allergies VITALS:  Blood pressure 117/83, pulse 77, temperature 97.8 F (36.6 C), temperature source Oral, resp. rate (!) 26, height 5' (1.524 m), weight 206 lb 5.6 oz (93.6 kg), SpO2 97 %. PHYSICAL EXAMINATION:  Physical Exam  Constitutional: She is oriented to person, place, and time and well-developed, well-nourished, and in no distress.  HENT:  Head: Normocephalic.  Mouth/Throat: Oropharynx is clear and moist.  Eyes: Pupils are equal, round, and reactive to light. Conjunctivae and EOM are normal. No scleral icterus.  Neck: Normal range of motion. Neck supple. No JVD present. No tracheal deviation present.  Cardiovascular: Normal rate, regular rhythm and normal heart sounds.   Exam reveals no gallop.   No murmur heard. Pulmonary/Chest: Effort normal. No respiratory distress. She has no wheezes. She has rales.  Abdominal: Soft. Bowel sounds are normal. She exhibits no distension. There is no tenderness. There is no rebound.  Musculoskeletal: Normal range of motion. She exhibits no edema or tenderness.  Neurological: She is alert and oriented to person, place, and time. No cranial nerve deficit.  Skin: No rash noted. No erythema.  Psychiatric: Affect normal.   LABORATORY PANEL:  Female CBC  Recent Labs Lab 08/20/16 0513  WBC 7.6  HGB 12.0  HCT 36.4  PLT 302   ------------------------------------------------------------------------------------------------------------------ Chemistries   Recent Labs Lab 08/19/16 1509 08/20/16 0513  NA 139 143  K 3.4* 4.1  CL 105 106  CO2 25 30  GLUCOSE 114* 101*  BUN 17 16  CREATININE 1.27* 1.13*  CALCIUM 9.0 8.5*  MG 2.1  --    RADIOLOGY:  Dg Chest 1 View  Result Date: 08/19/2016 CLINICAL DATA:  Shortness of breath. EXAM: CHEST 1 VIEW COMPARISON:  Chest x-ray dated August 13, 2014. FINDINGS: Stable cardiomegaly. Left chest wall AICD with leads terminating in the right atrium and right ventricle is unchanged. Mild bibasilar atelectasis. No focal consolidation, pleural effusion, or pneumothorax. No acute osseous abnormality. IMPRESSION: Stable cardiomegaly.  No active cardiopulmonary disease. Electronically Signed   By: Obie Dredge M.D.   On: 08/19/2016 15:36   ASSESSMENT AND PLAN:   #1. Acute respiratory failure with hypoxia,  off BiPAP, on oxygen, wean off oxygen as tolerated. NEB prn.   #2 ventricular fibrillation/V. tach, terminated by electrical shock,  continue patient on amiodarone, magnesium level is normal. Follow-up cardiology consult.  #3 elevated troponin due to  demand ischemia, On heparin drip. Continue coreg, aspirin, nitroglycerin, cardiology consultation is pending  #4. CKD stage III,  followed with diuresis, therapy, stable.  #5. Hyperglycemia, BS is normal today, check hemoglobin A1c to rule out diabetes   Hypokalemia. Improved with potassium supplement.  All the records are reviewed and case discussed with Care Management/Social Worker. Management plans discussed with the patient, her daughter and they are in agreement.  CODE STATUS: Full Code  TOTAL TIME TAKING CARE OF THIS PATIENT: 37 minutes.   More than 50% of the time was spent in counseling/coordination of care: YES  POSSIBLE D/C IN 2 DAYS, DEPENDING ON CLINICAL CONDITION.   Shaune Pollack M.D on 08/20/2016 at 11:07 AM  Between 7am to 6pm - Pager - [phone]  After 6pm go to [email or web address] - Social research officer, government  [facility] [facility]  Office  [phone]  CC: Primary care physician; Patient, No Pcp Per  Note: This dictation was prepared with Dragon dictation along with smaller phrase technology. Any transcriptional errors that result from this process are unintentional.

## 2016-08-20 NOTE — Progress Notes (Signed)
Strips reviewed with MHayes from [employer]. Multiple episodes of ventricular tachycardia are noted. Prolonged nonsustained ventricular tachycardia is also seen. We will plan to reprogram the device so as to deliver therapy rates 170-202 100-240. We will activate antitachycardia pacing in the first VT zone. She will need close EP follow-up.

## 2016-08-21 ENCOUNTER — Inpatient Hospital Stay (HOSPITAL_BASED_OUTPATIENT_CLINIC_OR_DEPARTMENT_OTHER): Payer: Medicare Other

## 2016-08-21 ENCOUNTER — Telehealth: Payer: Self-pay | Admitting: Physician Assistant

## 2016-08-21 ENCOUNTER — Telehealth: Payer: Self-pay | Admitting: Endocrinology

## 2016-08-21 ENCOUNTER — Encounter: Payer: Self-pay | Admitting: Radiology

## 2016-08-21 DIAGNOSIS — J9601 Acute respiratory failure with hypoxia: Secondary | ICD-10-CM | POA: Diagnosis not present

## 2016-08-21 DIAGNOSIS — I5023 Acute on chronic systolic (congestive) heart failure: Secondary | ICD-10-CM

## 2016-08-21 LAB — NM MYOCAR MULTI W/SPECT W/WALL MOTION / EF
CHL CUP NUCLEAR SRS: 12
CHL CUP NUCLEAR SSS: 15
CHL CUP RESTING HR STRESS: 80 {beats}/min
LV dias vol: 127 mL (ref 46–106)
LV sys vol: 96 mL
NUC STRESS TID: 1.09
Peak HR: 88 {beats}/min
Percent HR: 59 %
SDS: 6

## 2016-08-21 LAB — MAGNESIUM: Magnesium: 2 mg/dL (ref 1.7–2.4)

## 2016-08-21 LAB — CBC
HEMATOCRIT: 38.9 % (ref 35.0–47.0)
Hemoglobin: 12.7 g/dL (ref 12.0–16.0)
MCH: 24.7 pg — ABNORMAL LOW (ref 26.0–34.0)
MCHC: 32.6 g/dL (ref 32.0–36.0)
MCV: 75.8 fL — AB (ref 80.0–100.0)
PLATELETS: 324 10*3/uL (ref 150–440)
RBC: 5.14 MIL/uL (ref 3.80–5.20)
RDW: 17.2 % — AB (ref 11.5–14.5)
WBC: 8.3 10*3/uL (ref 3.6–11.0)

## 2016-08-21 LAB — BASIC METABOLIC PANEL
ANION GAP: 9 (ref 5–15)
BUN: 19 mg/dL (ref 6–20)
CALCIUM: 8.6 mg/dL — AB (ref 8.9–10.3)
CO2: 27 mmol/L (ref 22–32)
CREATININE: 1.24 mg/dL — AB (ref 0.44–1.00)
Chloride: 102 mmol/L (ref 101–111)
GFR, EST AFRICAN AMERICAN: 49 mL/min — AB (ref >60)
GFR, EST NON AFRICAN AMERICAN: 43 mL/min — AB (ref >60)
GLUCOSE: 120 mg/dL — AB (ref 65–99)
Potassium: 3.9 mmol/L (ref 3.5–5.1)
Sodium: 138 mmol/L (ref 135–145)

## 2016-08-21 LAB — HEPARIN LEVEL (UNFRACTIONATED): Heparin Unfractionated: 0.36 IU/mL (ref 0.30–0.70)

## 2016-08-21 LAB — T3, FREE: T3 FREE: 3.1 pg/mL (ref 2.0–4.4)

## 2016-08-21 MED ORDER — AMIODARONE HCL 400 MG PO TABS: 400.0000 mg | ORAL_TABLET | Freq: Two times a day (BID) | ORAL | 0 refills | Status: DC

## 2016-08-21 MED ORDER — APIXABAN 5 MG PO TABS: 5.0000 mg | ORAL_TABLET | Freq: Two times a day (BID) | ORAL | 3 refills | Status: DC

## 2016-08-21 MED ORDER — KLOR-CON M20 20 MEQ PO TBCR: 20.0000 meq | EXTENDED_RELEASE_TABLET | Freq: Every day | ORAL | 0 refills | Status: DC

## 2016-08-21 MED ORDER — SODIUM CHLORIDE 0.9 % IV SOLN: 250.0000 mL | INTRAVENOUS | Status: DC | PRN

## 2016-08-21 MED ORDER — FUROSEMIDE 80 MG PO TABS: 80.0000 mg | ORAL_TABLET | Freq: Two times a day (BID) | ORAL | 10 refills | Status: DC

## 2016-08-21 MED ORDER — REGADENOSON 0.4 MG/5ML IV SOLN
0.4000 mg | Freq: Once | INTRAVENOUS | Status: AC
  Administered 2016-08-21: 0.4 mg via INTRAVENOUS

## 2016-08-21 MED ORDER — TECHNETIUM TC 99M TETROFOSMIN IV KIT
10.0000 | PACK | Freq: Once | INTRAVENOUS | Status: AC | PRN
  Administered 2016-08-21: 12.32 via INTRAVENOUS

## 2016-08-21 MED ORDER — ATORVASTATIN CALCIUM 40 MG PO TABS
40.0000 mg | ORAL_TABLET | Freq: Every day | ORAL | 0 refills | Status: DC
Start: 2016-08-21 — End: 2016-09-21

## 2016-08-21 MED ORDER — CARVEDILOL 6.25 MG PO TABS: 6.2500 mg | ORAL_TABLET | Freq: Two times a day (BID) | ORAL | 3 refills | Status: DC

## 2016-08-21 MED ORDER — SODIUM CHLORIDE 0.9% FLUSH
3.0000 mL | Freq: Two times a day (BID) | INTRAVENOUS | Status: DC
  Administered 2016-08-21 – 2016-08-23 (×5): 3 mL via INTRAVENOUS

## 2016-08-21 MED ORDER — TECHNETIUM TC 99M TETROFOSMIN IV KIT
30.0000 | PACK | Freq: Once | INTRAVENOUS | Status: AC | PRN
  Administered 2016-08-21: 29.99 via INTRAVENOUS

## 2016-08-21 MED ORDER — SACUBITRIL-VALSARTAN 24-26 MG PO TABS: 1.0000 | ORAL_TABLET | Freq: Two times a day (BID) | ORAL | 1 refills | Status: DC

## 2016-08-21 MED ORDER — SPIRONOLACTONE 25 MG PO TABS: 12.5000 mg | ORAL_TABLET | Freq: Every day | ORAL | 0 refills | Status: DC

## 2016-08-21 MED ORDER — SODIUM CHLORIDE 0.9% FLUSH: 3.0000 mL | INTRAVENOUS | Status: DC | PRN

## 2016-08-21 MED ORDER — SODIUM CHLORIDE 0.9 % IV SOLN: INTRAVENOUS | Status: DC

## 2016-08-21 NOTE — Progress Notes (Signed)
Progress Note  Patient Name: Erin [name] Date of Encounter: 08/21/2016  Primary Cardiologist: Graciela Husbands  Subjective   Reports feeling well as morning, scheduled for stress testing Long discussion with son who is at the bedside of recent events including DVT Discussed changes to her defibrillator which were made yesterday Telemetry reviewed showing 11 beat run of VT yesterday afternoon Denies leg swelling, abdominal bloating, PND or orthopnea Echocardiogram discussed with him showing depressed ejection fraction 25% with dilated LV, global hypokinesis  Inpatient Medications    Scheduled Meds: . amiodarone  400 mg Oral BID  . aspirin EC  81 mg Oral Daily  . atorvastatin  40 mg Oral q1800  . carvedilol  6.25 mg Oral BID WC  . furosemide  80 mg Oral BID  . ipratropium-albuterol  3 mL Nebulization Q6H  . mouth rinse  15 mL Mouth Rinse BID  . methimazole  10 mg Oral BID  . nitroGLYCERIN  1 inch Topical Q6H  . potassium chloride SA  20 mEq Oral Daily  . sacubitril-valsartan  1 tablet Oral BID  . sodium chloride flush  3 mL Intravenous Q12H  . spironolactone  12.5 mg Oral Daily   Continuous Infusions: . sodium chloride    . heparin 1,000 Units/hr (08/20/16 1706)   PRN Meds: sodium chloride, acetaminophen **OR** acetaminophen, ondansetron **OR** ondansetron (ZOFRAN) IV, sodium chloride flush   Vital Signs    Vitals:   08/20/16 2335 08/21/16 0239 08/21/16 0527 08/21/16 0737  BP: 100/71  107/63   Pulse: 77  79   Resp:   18   Temp:   (!) 97.4 F (36.3 C)   TempSrc:   Oral   SpO2:  91% 97% 97%  Weight:   203 lb 9.6 oz (92.4 kg)   Height:        Intake/Output Summary (Last 24 hours) at 08/21/16 1217 Last data filed at 08/21/16 2595  Gross per 24 hour  Intake           524.67 ml  Output             1700 ml  Net         -1175.33 ml   Filed Weights   08/19/16 1500 08/19/16 1723 08/21/16 0527  Weight: 220 lb (99.8 kg) 206 lb 5.6 oz (93.6 kg) 203 lb 9.6 oz (92.4 kg)     Telemetry    Normal sinus rhythm with one run of VT 11 beats yesterday afternoon - Personally Reviewed  ECG    - Personally Reviewed  Physical Exam   GEN: No acute distress, Obese Neck: No JVD Cardiac: RRR, no murmurs, rubs, or gallops.  Respiratory: Clear to auscultation bilaterally. GI: Soft, nontender, non-distended  MS: No edema; No deformity. Neuro:  Nonfocal  Psych: Normal affect   Labs    Chemistry Recent Labs Lab 08/19/16 1509 08/20/16 0513 08/21/16 0440  NA 139 143 138  K 3.4* 4.1 3.9  CL 105 106 102  CO2 25 30 27   GLUCOSE 114* 101* 120*  BUN 17 16 19   CREATININE 1.27* 1.13* 1.24*  CALCIUM 9.0 8.5* 8.6*  GFRNONAA 41* 48* 43*  GFRAA 48* 55* 49*  ANIONGAP 9 7 9      Hematology Recent Labs Lab 08/19/16 1509 08/20/16 0513 08/21/16 0440  WBC 8.6 7.6 8.3  RBC 5.29* 4.78 5.14  HGB 12.7 12.0 12.7  HCT 39.7 36.4 38.9  MCV 75.0* 76.2* 75.8*  MCH 24.0* 25.1* 24.7*  MCHC 32.0 32.9 32.6  RDW 17.1* 17.4* 17.2*  PLT 361 302 324    Cardiac Enzymes Recent Labs Lab 08/19/16 1509 08/19/16 1811 08/19/16 2328 08/20/16 0513  TROPONINI 0.39* 0.37* 0.36* 0.34*   No results for input(s): TROPIPOC in the last 168 hours.   BNP Recent Labs Lab 08/19/16 1509  BNP 892.0*     DDimer No results for input(s): DDIMER in the last 168 hours.   Radiology    Dg Chest 1 View  Result Date: 08/19/2016 CLINICAL DATA:  Shortness of breath. EXAM: CHEST 1 VIEW COMPARISON:  Chest x-ray dated August 13, 2014. FINDINGS: Stable cardiomegaly. Left chest wall AICD with leads terminating in the right atrium and right ventricle is unchanged. Mild bibasilar atelectasis. No focal consolidation, pleural effusion, or pneumothorax. No acute osseous abnormality. IMPRESSION: Stable cardiomegaly.  No active cardiopulmonary disease. Electronically Signed   By: Obie Dredge M.D.   On: 08/19/2016 15:36    Cardiac Studies   Echocardiogram with ejection fraction 25%, dilated LV,  moderately elevated right heart pressures   Patient Profile     Erin [name] is a 71 y.o. female who is being seen today for the evaluation of ICD shock at the request of Dr. Winona Legato, MD. Patient has a h/o CAD medically managed, NICM/chronic systolic CHF s/p MDT ICD in 07/2012, PAF on Eliquis and amiodarone, pulmonary hypertension, hyperthyroidism on methimazole, HTN, HLD, OSA noncompliant with CPAP who presented to [facility] with generalized weakness followed by ICD shock.   Assessment & Plan    A/P 1) Acute on chronic systolic CHF She has been treated with IV Lasix now on Lasix by mouth 80 mg twice a day  continue current dose of carvedilol and entresto. Stressed importance of medication compliance She is not taking medications on arrival  2) Paroxysmal atrial fibrillation Previously seen by Dr. Graciela Husbands, on amiodarone Maintaining normal sinus rhythm Would continue anticoagulation with eliquis  Okay to stop heparin infusion  3)Sustained VT ICD shock Suspect exacerbated from acute on chronic systolic CHF, LV dilatation, global hypokinesis with ejection fraction 25% Would continue amiodarone 400 milligrams twice a day, oral dosing Pharmacologic Myoview, scheduled today [employer] has evaluated ICD, changed parameters for treatment of VT  Elevated troponin Demand ischemia in the setting of known coronary artery disease, ICD shock from VT  hyperthyroidism- Previously seen by endocrine, previously on methimazole  Medication noncompliance Stressed importance of following up in clinic in taking her medications  Long discussion with patient and patient's son at the bedside concerning all of the above Stressed the importance for medication compliance  Greater than 50% was spent in counseling and coordination of care with patient Total encounter time 35 minutes or more  Signed, Julien Nordmann, MD  08/21/2016, 12:17 PM

## 2016-08-21 NOTE — Progress Notes (Signed)
ANTICOAGULATION CONSULT NOTE  Pharmacy Consult for heparin drip Indication: chest pain/ACS  No Known Allergies  Patient Measurements: Height: 5' (152.4 cm) Weight: 206 lb 5.6 oz (93.6 kg) IBW/kg (Calculated) : 45.5 Heparin Dosing Weight: 69.7kg  Vital Signs: Temp: 97.8 F (36.6 C) (08/16 0745) Temp Source: Oral (08/16 0745) BP: 117/83 (08/16 0900) Pulse Rate: 77 (08/16 0900)  Labs:  Recent Labs  08/19/16 1509 08/19/16 1811 08/19/16 2328 08/20/16 0513 08/20/16 0816  HGB 12.7  --   --  12.0  --   HCT 39.7  --   --  36.4  --   PLT 361  --   --  302  --   APTT 29  --   --   --   --   LABPROT 14.0 13.8  --   --   --   INR 1.08 1.06  --   --   --   HEPARINUNFRC <0.10*  --  0.28*  --  0.44  CREATININE 1.27*  --   --  1.13*  --   TROPONINI 0.39* 0.37* 0.36* 0.34*  --     Estimated Creatinine Clearance: 46.6 mL/min (A) (by C-G formula based on SCr of 1.13 mg/dL (H)).   Medical History: Past Medical History:  Diagnosis Date  . Anginal pain (HCC)   . Automatic implantable cardioverter-defibrillator in situ    a. s/p MDT ICD 07/2012; b. SN # [id number]   . Chronic systolic CHF (congestive heart failure) (HCC)    a. echo 2014: EF 25%, diffuse HK, LA moderately dilated, RA mildly dilated, PASP 38 mm Hg; b. echo 08/2014: EF 25-30%, diffuse HK, RWMA cannot be excluded, mild MR, mild biatrial enlargement, RV mildly dilated, wall thickness nl, pacer wire/catheter noted, mild to mod TR, PASP high nl  . Coronary artery disease, non-occlusive    a. cath 2003: LM nl, mid LAD 50%, LCx nl, RCA nl b. L&RHC 08/17/2014 100% occluded mid LCx, 70% mid LAD with FFR 0.82. Medical therapy. CI 2.1. CO 4.01  . Encounter for long-term (current) use of anticoagulants   . Exertional shortness of breath   . H/O medication noncompliance   . High cholesterol   . Hypertension   . NICM (nonischemic cardiomyopathy) (HCC)    a. s/p AICD 07/22/2012  . Obesity   . OSA on CPAP    "suppose to; not often"  (07/22/2012)  . Other "heavy-for-dates" infants   . PAF (paroxysmal atrial fibrillation) (HCC)    a. on warfarin--> Eliquis 08/2014    Medications:  Scheduled:  . amiodarone      . amiodarone  400 mg Oral BID  . aspirin EC  81 mg Oral Daily  . atorvastatin  40 mg Oral q1800  . carvedilol  6.25 mg Oral BID WC  . furosemide  80 mg Oral BID  . ipratropium-albuterol  3 mL Nebulization Q6H  . mouth rinse  15 mL Mouth Rinse BID  . methimazole  10 mg Oral BID  . nitroGLYCERIN  1 inch Topical Q6H  . potassium chloride SA  20 mEq Oral Daily  . sacubitril-valsartan  1 tablet Oral BID  . sodium chloride flush  3 mL Intravenous Q12H  . spironolactone  12.5 mg Oral Daily    Assessment: Patient is a 71 year old female w/ a PMH of CAD, CHF, Afib on eliquis, ICD who presents with SOB, "weakness in my chest." patient first troponin is elevated at 0.39. Pharmacy consulted to dose heparin drip. Patient  off eliquis since at least Sunday (8/12). Patient currently receiving heparin at 1000 units/hr.   Goal of Therapy:  Heparin level 0.3-0.7 units/ml aPTT 66-102 seconds Monitor platelets by anticoagulation protocol: Yes   Plan:  Continue patient's heparin at 1000 units/hr. Follow up anti-Xa level with am labs.   8/17 @ 0440 HL 0.36 therapeutic. Will continue current rate and will recheck HL w/ am labs.  Per cardiology note, plan is to discharge patient on apixaban (eliquis).  Thomasene Ripple, PharmD, BCPS Clinical Pharmacist 08/21/2016

## 2016-08-21 NOTE — Care Management (Signed)
Provided patient with patient assistance applications through pharmaceutical companies for [employer] and Eliquis. Good Rx will not lower patients current copays of 45 dollars each.  Also provided patient with information from Medicare.gov for "Get Help paying Cost" and Section on assessing whether qualify for additional medicare part D assistance

## 2016-08-21 NOTE — Telephone Encounter (Signed)
TCM call. Pt currently admitted 8/30 8:00  Erin [name], [place]

## 2016-08-21 NOTE — Progress Notes (Signed)
Stress test Images reviewed, ischemia in the mid to distal anterior wall to the apical region Consistent with previous region of stenosis in the LAD Recommend cardiac catheterization on Monday, August 20 Orders have been placed  Signed, Dossie Arbour, MD, Ph.D [facility]

## 2016-08-21 NOTE — Telephone Encounter (Signed)
Patient needs a hospital follow up within a week. Call patient to get scheduled if possible.

## 2016-08-21 NOTE — Discharge Summary (Deleted)
[employer] [facility] [place] [facility]   PATIENT NAME: Erin [name]     MR#:  [id number]  DATE OF BIRTH:  05/19/1945  DATE OF ADMISSION:  08/19/2016 ADMITTING PHYSICIAN: Katharina Caper, MD  DATE OF DISCHARGE: 08/21/2016  PRIMARY CARE PHYSICIAN: Patient, No Pcp Per    ADMISSION DIAGNOSIS:  Ventricular fibrillation (HCC) [I49.01] NSTEMI (non-ST elevated myocardial infarction) (HCC) [I21.4] Acute on chronic congestive heart failure, unspecified heart failure type (HCC) [I50.9]  DISCHARGE DIAGNOSIS:  Principal Problem:   Acute respiratory failure with hypoxia (HCC) Active Problems:   CORONARY ATHEROSCLEROSIS NATIVE CORONARY ARTERY   AICD (automatic cardioverter/defibrillator) present   H/O medication noncompliance   PAF (paroxysmal atrial fibrillation) (HCC)   Acute on chronic systolic CHF (congestive heart failure) (HCC)   Ventricular fibrillation (HCC)   Demand ischemia (HCC)   CKD (chronic kidney disease) stage 3, GFR 30-59 ml/min   Ventricular tachycardia (HCC)   SECONDARY DIAGNOSIS:   Past Medical History:  Diagnosis Date  . Anginal pain (HCC)   . Automatic implantable cardioverter-defibrillator in situ    a. s/p MDT ICD 07/2012; b. SN # [id number]   . Chronic systolic CHF (congestive heart failure) (HCC)    a. echo 2014: EF 25%, diffuse HK, LA moderately dilated, RA mildly dilated, PASP 38 mm Hg; b. echo 08/2014: EF 25-30%, diffuse HK, RWMA cannot be excluded, mild MR, mild biatrial enlargement, RV mildly dilated, wall thickness nl, pacer wire/catheter noted, mild to mod TR, PASP high nl  . Coronary artery disease, non-occlusive    a. cath 2003: LM nl, mid LAD 50%, LCx nl, RCA nl b. L&RHC 08/17/2014 100% occluded mid LCx, 70% mid LAD with FFR 0.82. Medical therapy. CI 2.1. CO 4.01  . Encounter for long-term (current) use of anticoagulants   . Exertional shortness of breath   . H/O medication noncompliance   . High cholesterol   . Hypertension   . NICM  (nonischemic cardiomyopathy) (HCC)    a. s/p AICD 07/22/2012  . Obesity   . OSA on CPAP    "suppose to; not often" (07/22/2012)  . Other "heavy-for-dates" infants   . PAF (paroxysmal atrial fibrillation) (HCC)    a. on warfarin--> Eliquis 08/2014    HOSPITAL COURSE:  71 year old female with known coronary artery disease,  Small atrial fibrillation and chronic systolic heart failure with ICD placed in 2014 who presented with weakness and found to have acute hypoxic respiratory failure in the setting of these fitted and CHF.  1. Acute hypoxic respiratory failure in the setting of Acute on chronic systolic heart failure ejection fraction 25-30%: She has diuresis IV Lasix and will be transitioned her oral Lasix dose. She will continue with potassium supplementation. She is referred to CHF clinic at discharge. She'll continue with Coreg,entresto and aldactone as per cardiology She has been weaned off of oxygen  2.VT/VF with shock delivery by ICD: ICD interrogated and reprogrammed device so as to deliver therapy rates 170-202 100-240.  3. Chronic kidney disease stage III: Creatinine stable  4. Elevated troponin due to demand ischemia and not ACS.  5. Hypokalemia: Repleted  DISCHARGE CONDITIONS AND DIET:   Stable for discharge on cardiac diet  CONSULTS OBTAINED:  Treatment Team:  Nahser, Deloris Ping, MD Shane Crutch, MD  DRUG ALLERGIES:  No Known Allergies  DISCHARGE MEDICATIONS:   Current Discharge Medication List    START taking these medications   Details  atorvastatin (LIPITOR) 40 MG tablet Take 1 tablet (40 mg total)  by mouth daily at 6 PM. Qty: 30 tablet, Refills: 0      CONTINUE these medications which have CHANGED   Details  amiodarone (PACERONE) 400 MG tablet Take 1 tablet (400 mg total) by mouth 2 (two) times daily. Qty: 60 tablet, Refills: 0    KLOR-CON M20 20 MEQ tablet Take 1 tablet (20 mEq total) by mouth daily. APPOINTMENT NEEDED Qty: 30 tablet,  Refills: 0      CONTINUE these medications which have NOT CHANGED   Details  apixaban (ELIQUIS) 5 MG TABS tablet Take 1 tablet (5 mg total) by mouth 2 (two) times daily. Qty: 180 tablet, Refills: 3    carvedilol (COREG) 6.25 MG tablet Take 1 tablet (6.25 mg total) by mouth 2 (two) times daily with a meal. Qty: 60 tablet, Refills: 3    furosemide (LASIX) 80 MG tablet Take 1 tablet (80 mg total) by mouth 2 (two) times daily. Qty: 60 tablet, Refills: 10    methimazole (TAPAZOLE) 10 MG tablet TAKE ONE TABLET BY MOUTH TWICE DAILY Qty: 60 tablet, Refills: 2    sacubitril-valsartan (ENTRESTO) 24-26 MG Take 1 tablet by mouth 2 (two) times daily. Qty: 60 tablet, Refills: 1    spironolactone (ALDACTONE) 25 MG tablet Take 0.5 tablets (12.5 mg total) by mouth daily. APPOINTMENT NEEDED Qty: 15 tablet, Refills: 0          Today   CHIEF COMPLAINT:   No acute issues overnight   VITAL SIGNS:  Blood pressure 107/63, pulse 79, temperature (!) 97.4 F (36.3 C), temperature source Oral, resp. rate 18, height 5' (1.524 m), weight 92.4 kg (203 lb 9.6 oz), SpO2 97 %.   REVIEW OF SYSTEMS:  Review of Systems  Constitutional: Negative.  Negative for chills, fever and malaise/fatigue.  HENT: Negative.  Negative for ear discharge, ear pain, hearing loss, nosebleeds and sore throat.   Eyes: Negative.  Negative for blurred vision and pain.  Respiratory: Negative.  Negative for cough, hemoptysis, shortness of breath and wheezing.   Cardiovascular: Negative.  Negative for chest pain, palpitations and leg swelling.  Gastrointestinal: Negative.  Negative for abdominal pain, blood in stool, diarrhea, nausea and vomiting.  Genitourinary: Negative.  Negative for dysuria.  Musculoskeletal: Negative.  Negative for back pain.  Skin: Negative.   Neurological: Negative for dizziness, tremors, speech change, focal weakness, seizures and headaches.  Endo/Heme/Allergies: Negative.  Does not bruise/bleed  easily.  Psychiatric/Behavioral: Negative.  Negative for depression, hallucinations and suicidal ideas.     PHYSICAL EXAMINATION:  GENERAL:  71 y.o.-year-old patient lying in the bed with no acute distress.  NECK:  Supple, no jugular venous distention. No thyroid enlargement, no tenderness.  LUNGS: Normal breath sounds bilaterally, no wheezing, rales,rhonchi  No use of accessory muscles of respiration.  CARDIOVASCULAR: S1, S2 normal. No murmurs, rubs, or gallops.  ABDOMEN: Soft, non-tender, non-distended. Bowel sounds present. No organomegaly or mass.  EXTREMITIES: mild pedal edema,no cyanosis, or clubbing.  PSYCHIATRIC: The patient is alert and oriented x 3.  SKIN: No obvious rash, lesion, or ulcer.   DATA REVIEW:   CBC  Recent Labs Lab 08/21/16 0440  WBC 8.3  HGB 12.7  HCT 38.9  PLT 324    Chemistries   Recent Labs Lab 08/21/16 0440  NA 138  K 3.9  CL 102  CO2 27  GLUCOSE 120*  BUN 19  CREATININE 1.24*  CALCIUM 8.6*  MG 2.0    Cardiac Enzymes  Recent Labs Lab 08/19/16 1811 08/19/16 2328 08/20/16  0513  TROPONINI 0.37* 0.36* 0.34*    Microbiology Results  @[email or web address] @  RADIOLOGY:  Dg Chest 1 View  Result Date: 08/19/2016 CLINICAL DATA:  Shortness of breath. EXAM: CHEST 1 VIEW COMPARISON:  Chest x-ray dated August 13, 2014. FINDINGS: Stable cardiomegaly. Left chest wall AICD with leads terminating in the right atrium and right ventricle is unchanged. Mild bibasilar atelectasis. No focal consolidation, pleural effusion, or pneumothorax. No acute osseous abnormality. IMPRESSION: Stable cardiomegaly.  No active cardiopulmonary disease. Electronically Signed   By: Obie Dredge M.D.   On: 08/19/2016 15:36      Current Discharge Medication List    START taking these medications   Details  atorvastatin (LIPITOR) 40 MG tablet Take 1 tablet (40 mg total) by mouth daily at 6 PM. Qty: 30 tablet, Refills: 0      CONTINUE these medications which have  CHANGED   Details  amiodarone (PACERONE) 400 MG tablet Take 1 tablet (400 mg total) by mouth 2 (two) times daily. Qty: 60 tablet, Refills: 0    KLOR-CON M20 20 MEQ tablet Take 1 tablet (20 mEq total) by mouth daily. APPOINTMENT NEEDED Qty: 30 tablet, Refills: 0      CONTINUE these medications which have NOT CHANGED   Details  apixaban (ELIQUIS) 5 MG TABS tablet Take 1 tablet (5 mg total) by mouth 2 (two) times daily. Qty: 180 tablet, Refills: 3    carvedilol (COREG) 6.25 MG tablet Take 1 tablet (6.25 mg total) by mouth 2 (two) times daily with a meal. Qty: 60 tablet, Refills: 3    furosemide (LASIX) 80 MG tablet Take 1 tablet (80 mg total) by mouth 2 (two) times daily. Qty: 60 tablet, Refills: 10    methimazole (TAPAZOLE) 10 MG tablet TAKE ONE TABLET BY MOUTH TWICE DAILY Qty: 60 tablet, Refills: 2    sacubitril-valsartan (ENTRESTO) 24-26 MG Take 1 tablet by mouth 2 (two) times daily. Qty: 60 tablet, Refills: 1    spironolactone (ALDACTONE) 25 MG tablet Take 0.5 tablets (12.5 mg total) by mouth daily. APPOINTMENT NEEDED Qty: 15 tablet, Refills: 0           Management plans discussed with the patient and she is in agreement. Stable for discharge   Patient should follow up with cardiology  CODE STATUS:     Code Status Orders        Start     Ordered   08/19/16 1715  Full code  Continuous     08/19/16 1714    Code Status History    Date Active Date Inactive Code Status Order ID Comments User Context   08/17/2014  2:10 PM 08/22/2014  8:57 PM Full Code [id number]  Kathleene Hazel, MD Inpatient   08/15/2014 10:15 PM 08/17/2014  2:10 PM Full Code [id number]  Quintella Reichert, MD Inpatient   08/13/2014  6:23 PM 08/15/2014 10:15 PM Full Code [id number]  Houston Siren, MD Inpatient      TOTAL TIME TAKING CARE OF THIS PATIENT: 37 minutes.    Note: This dictation was prepared with Dragon dictation along with smaller phrase technology. Any transcriptional errors that  result from this process are unintentional.  Larren Copes M.D on 08/21/2016 at 11:21 AM  Between 7am to 6pm - Pager - [phone] After 6pm go to [email or web address] - password [email or web address]  [facility]  Office  [phone]  CC: Primary care physician; Patient, No Pcp Per

## 2016-08-21 NOTE — Care Management (Addendum)
Patient had abnormal stress test today and per primary nurse, patient to have cardiac cath Monday.  have reached out to [facility] to consider home health referral for this patient as patient's insurance is not accepted by a majority of the local home health agencies at this time.  She has a Heart Failure Clinic appointment.  She does not have PCP.  Will reach out to Dr Mariah Milling and ask for his office to sign and follow for home health nurse.  There is an order for a walker.  Discontinued.  Patient has a walker at home

## 2016-08-21 NOTE — Progress Notes (Signed)
Patient off unit for testing. Will take vital signs around noon. Erin [name] [facility]

## 2016-08-21 NOTE — Plan of Care (Signed)
Problem: Physical Regulation: Goal: Ability to maintain clinical measurements within normal limits will improve Outcome: Not Progressing Abnormal stress test earlier today. Possibly to undergo cardiac catheterization on Monday morning. Erin [name] [facility]

## 2016-08-22 DIAGNOSIS — R9439 Abnormal result of other cardiovascular function study: Secondary | ICD-10-CM

## 2016-08-22 LAB — HEPARIN LEVEL (UNFRACTIONATED): HEPARIN UNFRACTIONATED: 0.47 [IU]/mL (ref 0.30–0.70)

## 2016-08-22 MED ORDER — IPRATROPIUM-ALBUTEROL 0.5-2.5 (3) MG/3ML IN SOLN
3.0000 mL | Freq: Three times a day (TID) | RESPIRATORY_TRACT | Status: DC
  Administered 2016-08-23 – 2016-08-25 (×7): 3 mL via RESPIRATORY_TRACT
  Filled 2016-08-22 (×7): qty 3

## 2016-08-22 NOTE — Progress Notes (Signed)
Progress Note  Patient Name: Erin [name] Date of Encounter: 08/22/2016  Primary Cardiologist: Graciela Husbands  Subjective   No complaints overnight Denies any chest pain or shortness of breath Denies leg swelling, abdominal bloating, PND or orthopnea Echocardiogram discussed with him showing depressed ejection fraction 25% with dilated LV, global hypokinesis  Stress test results discussed with her in detail, anterior wall ischemia  Inpatient Medications    Scheduled Meds: . amiodarone  400 mg Oral BID  . aspirin EC  81 mg Oral Daily  . atorvastatin  40 mg Oral q1800  . carvedilol  6.25 mg Oral BID WC  . furosemide  80 mg Oral BID  . ipratropium-albuterol  3 mL Nebulization Q6H  . mouth rinse  15 mL Mouth Rinse BID  . methimazole  10 mg Oral BID  . nitroGLYCERIN  1 inch Topical Q6H  . potassium chloride SA  20 mEq Oral Daily  . sacubitril-valsartan  1 tablet Oral BID  . sodium chloride flush  3 mL Intravenous Q12H  . sodium chloride flush  3 mL Intravenous Q12H  . spironolactone  12.5 mg Oral Daily   Continuous Infusions: . sodium chloride    . sodium chloride    . sodium chloride    . heparin 1,000 Units/hr (08/21/16 2202)   PRN Meds: sodium chloride, sodium chloride, acetaminophen **OR** acetaminophen, ondansetron **OR** ondansetron (ZOFRAN) IV, sodium chloride flush, sodium chloride flush   Vital Signs    Vitals:   08/22/16 0447 08/22/16 0719 08/22/16 0753 08/22/16 1224  BP:   111/75 102/60  Pulse:   78 82  Resp:   16 16  Temp:   98.5 F (36.9 C) 98.4 F (36.9 C)  TempSrc:   Oral Oral  SpO2:  90% 92% 91%  Weight: 202 lb 9 oz (91.9 kg)     Height:        Intake/Output Summary (Last 24 hours) at 08/22/16 1431 Last data filed at 08/22/16 1300  Gross per 24 hour  Intake              600 ml  Output             1550 ml  Net             -950 ml   Filed Weights   08/19/16 1723 08/21/16 0527 08/22/16 0447  Weight: 206 lb 5.6 oz (93.6 kg) 203 lb 9.6 oz (92.4  kg) 202 lb 9 oz (91.9 kg)    Telemetry    Normal sinus rhythm  - Personally Reviewed  ECG      Physical Exam    GEN: No acute distress, Obese Neck: No JVD Cardiac: RRR, no murmurs, rubs, or gallops.  Respiratory: Clear to auscultation bilaterally. GI: Soft, nontender, non-distended  MS: No edema; No deformity. Neuro:  Nonfocal  Psych: Normal affect   Labs    Chemistry  Recent Labs Lab 08/19/16 1509 08/20/16 0513 08/21/16 0440  NA 139 143 138  K 3.4* 4.1 3.9  CL 105 106 102  CO2 25 30 27   GLUCOSE 114* 101* 120*  BUN 17 16 19   CREATININE 1.27* 1.13* 1.24*  CALCIUM 9.0 8.5* 8.6*  GFRNONAA 41* 48* 43*  GFRAA 48* 55* 49*  ANIONGAP 9 7 9      Hematology  Recent Labs Lab 08/19/16 1509 08/20/16 0513 08/21/16 0440  WBC 8.6 7.6 8.3  RBC 5.29* 4.78 5.14  HGB 12.7 12.0 12.7  HCT 39.7 36.4 38.9  MCV 75.0*  76.2* 75.8*  MCH 24.0* 25.1* 24.7*  MCHC 32.0 32.9 32.6  RDW 17.1* 17.4* 17.2*  PLT 361 302 324    Cardiac Enzymes  Recent Labs Lab 08/19/16 1509 08/19/16 1811 08/19/16 2328 08/20/16 0513  TROPONINI 0.39* 0.37* 0.36* 0.34*   No results for input(s): TROPIPOC in the last 168 hours.   BNP  Recent Labs Lab 08/19/16 1509  BNP 892.0*     DDimer No results for input(s): DDIMER in the last 168 hours.   Radiology    Nm Myocar Multi W/spect W/wall Motion / Ef  Result Date: 08/21/2016 Pharmacological myocardial perfusion imaging study with ISCHEMIA of moderate size, moderate perfusion defect in the mid to apical anterior wall Also with fixed defect in the basal to mid lateral wall. Normal wall motion, EF estimated at 24% No EKG changes concerning for ischemia at peak stress or in recovery. High risk scan Consider cardiac catheterization given known LAD disease by prior cardiac cath Signed, Dossie Arbour, MD, Ph.D [facility]    Cardiac Studies   Echocardiogram with ejection fraction 25%, dilated LV, moderately elevated right heart  pressures   Patient Profile     Erin [name] is a 71 y.o. female who is being seen today for the evaluation of ICD shock at the request of Dr. Winona Legato, MD. Patient has a h/o CAD medically managed, NICM/chronic systolic CHF s/p MDT ICD in 07/2012, PAF on Eliquis and amiodarone, pulmonary hypertension, hyperthyroidism on methimazole, HTN, HLD, OSA noncompliant with CPAP who presented to [facility] with generalized weakness followed by ICD shock.   Assessment & Plan    A/P 1) Acute on chronic systolic CHF  on Lasix by mouth 80 mg twice a day  continue current dose of carvedilol and entresto. Stressed importance of medication compliance She was not taking medications on arrival  2) Paroxysmal atrial fibrillation Previously seen by Dr. Graciela Husbands, on amiodarone Maintaining normal sinus rhythm eliquis on hold for catheterization on Monday On heparin infusion  3)Sustained VT ICD shock Suspect exacerbated from acute on chronic systolic CHF, LV dilatation, global hypokinesis with ejection fraction 25% --continue amiodarone 400 milligrams twice a day, oral dosing [employer] has evaluated ICD, changed parameters for treatment of VT --Cardiac cath on Monday  Elevated troponin Demand ischemia in the setting of known coronary artery disease, ICD shock from VT Positive stress test anterior wall ischemia Scheduled for cardiac catheterization on Monday I have reviewed the risks, indications, and alternatives to cardiac catheterization, possible angioplasty, and stenting with the patient. Risks include but are not limited to bleeding, infection, vascular injury, stroke, myocardial infection, arrhythmia, kidney injury, radiation-related injury in the case of prolonged fluoroscopy use, emergency cardiac surgery, and death. The patient understands the risks of serious complication is 1-2 in 1000 with diagnostic cardiac cath and 1-2% or less with angioplasty/stenting.  Scheduled early Monday, orders  placed  hyperthyroidism- Previously seen by endocrine, previously on methimazole  Medication noncompliance Stressed importance of following up in clinic in taking her medications   Greater than 50% was spent in counseling and coordination of care with patient Total encounter time 35 minutes or more  Signed, Julien Nordmann, MD  08/22/2016, 2:31 PM

## 2016-08-22 NOTE — Progress Notes (Signed)
ANTICOAGULATION CONSULT NOTE  Pharmacy Consult for heparin drip Indication: chest pain/ACS  No Known Allergies  Patient Measurements: Height: 5' (152.4 cm) Weight: 206 lb 5.6 oz (93.6 kg) IBW/kg (Calculated) : 45.5 Heparin Dosing Weight: 69.7kg  Vital Signs: Temp: 97.8 F (36.6 C) (08/16 0745) Temp Source: Oral (08/16 0745) BP: 117/83 (08/16 0900) Pulse Rate: 77 (08/16 0900)  Labs:  Recent Labs  08/19/16 1509 08/19/16 1811 08/19/16 2328 08/20/16 0513 08/20/16 0816  HGB 12.7  --   --  12.0  --   HCT 39.7  --   --  36.4  --   PLT 361  --   --  302  --   APTT 29  --   --   --   --   LABPROT 14.0 13.8  --   --   --   INR 1.08 1.06  --   --   --   HEPARINUNFRC <0.10*  --  0.28*  --  0.44  CREATININE 1.27*  --   --  1.13*  --   TROPONINI 0.39* 0.37* 0.36* 0.34*  --     Estimated Creatinine Clearance: 46.6 mL/min (A) (by C-G formula based on SCr of 1.13 mg/dL (H)).   Medical History: Past Medical History:  Diagnosis Date  . Anginal pain (HCC)   . Automatic implantable cardioverter-defibrillator in situ    a. s/p MDT ICD 07/2012; b. SN # [id number]   . Chronic systolic CHF (congestive heart failure) (HCC)    a. echo 2014: EF 25%, diffuse HK, LA moderately dilated, RA mildly dilated, PASP 38 mm Hg; b. echo 08/2014: EF 25-30%, diffuse HK, RWMA cannot be excluded, mild MR, mild biatrial enlargement, RV mildly dilated, wall thickness nl, pacer wire/catheter noted, mild to mod TR, PASP high nl  . Coronary artery disease, non-occlusive    a. cath 2003: LM nl, mid LAD 50%, LCx nl, RCA nl b. L&RHC 08/17/2014 100% occluded mid LCx, 70% mid LAD with FFR 0.82. Medical therapy. CI 2.1. CO 4.01  . Encounter for long-term (current) use of anticoagulants   . Exertional shortness of breath   . H/O medication noncompliance   . High cholesterol   . Hypertension   . NICM (nonischemic cardiomyopathy) (HCC)    a. s/p AICD 07/22/2012  . Obesity   . OSA on CPAP    "suppose to; not often"  (07/22/2012)  . Other "heavy-for-dates" infants   . PAF (paroxysmal atrial fibrillation) (HCC)    a. on warfarin--> Eliquis 08/2014    Medications:  Scheduled:  . amiodarone      . amiodarone  400 mg Oral BID  . aspirin EC  81 mg Oral Daily  . atorvastatin  40 mg Oral q1800  . carvedilol  6.25 mg Oral BID WC  . furosemide  80 mg Oral BID  . ipratropium-albuterol  3 mL Nebulization Q6H  . mouth rinse  15 mL Mouth Rinse BID  . methimazole  10 mg Oral BID  . nitroGLYCERIN  1 inch Topical Q6H  . potassium chloride SA  20 mEq Oral Daily  . sacubitril-valsartan  1 tablet Oral BID  . sodium chloride flush  3 mL Intravenous Q12H  . spironolactone  12.5 mg Oral Daily    Assessment: Patient is a 71 year old female w/ a PMH of CAD, CHF, Afib on eliquis, ICD who presents with SOB, "weakness in my chest." patient first troponin is elevated at 0.39. Pharmacy consulted to dose heparin drip. Patient  off eliquis since at least Sunday (8/12). Patient currently receiving heparin at 1000 units/hr.   Goal of Therapy:  Heparin level 0.3-0.7 units/ml aPTT 66-102 seconds Monitor platelets by anticoagulation protocol: Yes   Plan:  Continue patient's heparin at 1000 units/hr. Follow up anti-Xa level with am labs.   8/17 @ 0440 HL 0.36 therapeutic. Will continue current rate and will recheck HL w/ am labs.  8/18 @ 0500 HL 0.47 therapeutic. Will continue current rate and will recheck HL w/ am labs.  Per cardiology note, plan is to discharge patient on apixaban (eliquis).  Thomasene Ripple, PharmD, BCPS Clinical Pharmacist 08/22/2016

## 2016-08-22 NOTE — Progress Notes (Signed)
CCMD called stating pt. Had 7 beats of V-tach, upon assessment pt. Alert and oriented with no SOB or acute distress. Prime aware. No new orders at this time.

## 2016-08-22 NOTE — Progress Notes (Signed)
[employer] - [place] at [facility]   PATIENT NAME: Erin [name]    MR#:  [id number]  DATE OF BIRTH:  November 09, 1945  SUBJECTIVE:  Doing well this am no chest pain or SOB  REVIEW OF SYSTEMS:    Review of Systems  Constitutional: Negative for fever, chills weight loss HENT: Negative for ear pain, nosebleeds, congestion, facial swelling, rhinorrhea, neck pain, neck stiffness and ear discharge.   Respiratory: Negative for cough, shortness of breath, wheezing  Cardiovascular: Negative for chest pain, palpitations and leg swelling.  Gastrointestinal: Negative for heartburn, abdominal pain, vomiting, diarrhea or consitpation Genitourinary: Negative for dysuria, urgency, frequency, hematuria Musculoskeletal: Negative for back pain or joint pain Neurological: Negative for dizziness, seizures, syncope, focal weakness,  numbness and headaches.  Hematological: Does not bruise/bleed easily.  Psychiatric/Behavioral: Negative for hallucinations, confusion, dysphoric mood    Tolerating Diet: yes      DRUG ALLERGIES:  No Known Allergies  VITALS:  Blood pressure 111/75, pulse 78, temperature 98.5 F (36.9 C), temperature source Oral, resp. rate 16, height 5' (1.524 m), weight 91.9 kg (202 lb 9 oz), SpO2 92 %.  PHYSICAL EXAMINATION:  Constitutional: Appears well-developed and well-nourished. No distress. HENT: Normocephalic. Marland Kitchen Oropharynx is clear and moist.  Eyes: Conjunctivae and EOM are normal. PERRLA, no scleral icterus.  Neck: Normal ROM. Neck supple. No JVD. No tracheal deviation. CVS: RRR, S1/S2 +, no murmurs, no gallops, no carotid bruit.  Pulmonary: Effort and breath sounds normal, no stridor, rhonchi, wheezes, rales.  Abdominal: Soft. BS +,  no distension, tenderness, rebound or guarding.  Musculoskeletal: Normal range of motion. No edema and no tenderness.  Neuro: Alert. CN 2-12 grossly intact. No focal deficits. Skin: Skin is warm and dry. No rash  noted. Psychiatric: Normal mood and affect.      LABORATORY PANEL:   CBC  Recent Labs Lab 08/21/16 0440  WBC 8.3  HGB 12.7  HCT 38.9  PLT 324   ------------------------------------------------------------------------------------------------------------------  Chemistries   Recent Labs Lab 08/21/16 0440  NA 138  K 3.9  CL 102  CO2 27  GLUCOSE 120*  BUN 19  CREATININE 1.24*  CALCIUM 8.6*  MG 2.0   ------------------------------------------------------------------------------------------------------------------  Cardiac Enzymes  Recent Labs Lab 08/19/16 1811 08/19/16 2328 08/20/16 0513  TROPONINI 0.37* 0.36* 0.34*   ------------------------------------------------------------------------------------------------------------------  RADIOLOGY:  Nm Myocar Multi W/spect W/wall Motion / Ef  Result Date: 08/21/2016 Pharmacological myocardial perfusion imaging study with ISCHEMIA of moderate size, moderate perfusion defect in the mid to apical anterior wall Also with fixed defect in the basal to mid lateral wall. Normal wall motion, EF estimated at 24% No EKG changes concerning for ischemia at peak stress or in recovery. High risk scan Consider cardiac catheterization given known LAD disease by prior cardiac cath Signed, Dossie Arbour, MD, Ph.D [facility]     ASSESSMENT AND PLAN:   71 year old female with known coronary artery disease,paroxsmal atrial fibrillation and chronic systolic heart failure with ICD placed in 2014 who presented with weakness and found to have acute hypoxic respiratory failure in the setting of these fitted and CHF.  1. Acute hypoxic respiratory failure in the setting of Acute on chronic systolic heart failure ejection fraction 25-30%: She has diuresis IV Lasix and is now on oral Lasix dose.  Continue with Coreg,entresto and aldactone as per cardiology She has been weaned off of oxygen  2.VT/VF with shock delivery by ICD: ICD  interrogated and reprogrammed device so as to deliver therapy rates 170-202 100-240.  Continue amiodarone 3. Chronic kidney disease stage III: Creatinine stable  4. Elevated troponin due to demand ischemia and not ACS. Stress test is abnormal and therefor will need cath on Monday Continue heparin gtt as per cardiology for now  5. Hypokalemia: Repleted 6. Hypothyroidism: Continue methimazole. Patient will need outpatient follow-up with her endocrinologist. 7. Hyperlipidemia: Continue statin     Management plans discussed with the patient and she is in agreement.  CODE STATUS: full  TOTAL TIME TAKING CARE OF THIS PATIENT: 24 minutes.     POSSIBLE D/C Tuesday, DEPENDING ON CLINICAL CONDITION.   Amirah Goerke M.D on 08/21/2016 at 9:43 AM  Between 7am to 6pm - Pager - [phone] After 6pm go to [email or web address] - password [email or web address]  [facility] [place] [facility]  Office  [phone]  CC: Primary care physician; Patient, No Pcp Per  Note: This dictation was prepared with Dragon dictation along with smaller phrase technology. Any transcriptional errors that result from this process are unintentional.

## 2016-08-22 NOTE — Progress Notes (Signed)
[employer] - [place] at [facility]   PATIENT NAME: Erin [name]    MR#:  [id number]  DATE OF BIRTH:  05/04/1945  SUBJECTIVE:   Patient seen in stress lab  REVIEW OF SYSTEMS:    Review of Systems  Constitutional: Negative for fever, chills weight loss HENT: Negative for ear pain, nosebleeds, congestion, facial swelling, rhinorrhea, neck pain, neck stiffness and ear discharge.   Respiratory: Negative for cough, shortness of breath, wheezing  Cardiovascular: Negative for chest pain, palpitations and leg swelling.  Gastrointestinal: Negative for heartburn, abdominal pain, vomiting, diarrhea or consitpation Genitourinary: Negative for dysuria, urgency, frequency, hematuria Musculoskeletal: Negative for back pain or joint pain Neurological: Negative for dizziness, seizures, syncope, focal weakness,  numbness and headaches.  Hematological: Does not bruise/bleed easily.  Psychiatric/Behavioral: Negative for hallucinations, confusion, dysphoric mood    Tolerating Diet: yes      DRUG ALLERGIES:  No Known Allergies  VITALS:  Blood pressure 111/75, pulse 78, temperature 98.5 F (36.9 C), temperature source Oral, resp. rate 16, height 5' (1.524 m), weight 91.9 kg (202 lb 9 oz), SpO2 92 %.  PHYSICAL EXAMINATION:  Constitutional: Appears well-developed and well-nourished. No distress. HENT: Normocephalic. Marland Kitchen Oropharynx is clear and moist.  Eyes: Conjunctivae and EOM are normal. PERRLA, no scleral icterus.  Neck: Normal ROM. Neck supple. No JVD. No tracheal deviation. CVS: RRR, S1/S2 +, no murmurs, no gallops, no carotid bruit.  Pulmonary: Effort and breath sounds normal, no stridor, rhonchi, wheezes, rales.  Abdominal: Soft. BS +,  no distension, tenderness, rebound or guarding.  Musculoskeletal: Normal range of motion. No edema and no tenderness.  Neuro: Alert. CN 2-12 grossly intact. No focal deficits. Skin: Skin is warm and dry. No rash noted. Psychiatric:  Normal mood and affect.      LABORATORY PANEL:   CBC  Recent Labs Lab 08/21/16 0440  WBC 8.3  HGB 12.7  HCT 38.9  PLT 324   ------------------------------------------------------------------------------------------------------------------  Chemistries   Recent Labs Lab 08/21/16 0440  NA 138  K 3.9  CL 102  CO2 27  GLUCOSE 120*  BUN 19  CREATININE 1.24*  CALCIUM 8.6*  MG 2.0   ------------------------------------------------------------------------------------------------------------------  Cardiac Enzymes  Recent Labs Lab 08/19/16 1811 08/19/16 2328 08/20/16 0513  TROPONINI 0.37* 0.36* 0.34*   ------------------------------------------------------------------------------------------------------------------  RADIOLOGY:  Nm Myocar Multi W/spect W/wall Motion / Ef  Result Date: 08/21/2016 Pharmacological myocardial perfusion imaging study with ISCHEMIA of moderate size, moderate perfusion defect in the mid to apical anterior wall Also with fixed defect in the basal to mid lateral wall. Normal wall motion, EF estimated at 24% No EKG changes concerning for ischemia at peak stress or in recovery. High risk scan Consider cardiac catheterization given known LAD disease by prior cardiac cath Signed, Dossie Arbour, MD, Ph.D [facility]     ASSESSMENT AND PLAN:   71 year old female with known coronary artery disease,  Small atrial fibrillation and chronic systolic heart failure with ICD placed in 2014 who presented with weakness and found to have acute hypoxic respiratory failure in the setting of these fitted and CHF.  1. Acute hypoxic respiratory failure in the setting of Acute on chronic systolic heart failure ejection fraction 25-30%: She has diuresis IV Lasix and will be transitioned her oral Lasix dose. She will continue with potassium supplementation. She'll continue with Coreg,entresto and aldactone as per cardiology She has been weaned off of  oxygen  2.VT/VF with shock delivery by ICD: ICD interrogated and reprogrammed device so as  to deliver therapy rates 170-202 100-240.  3. Chronic kidney disease stage III: Creatinine stable  4. Elevated troponin due to demand ischemia and not ACS. Follow up stress test   5. Hypokalemia: Repleted       Management plans discussed with the patient and she is in agreement.  CODE STATUS: full  TOTAL TIME TAKING CARE OF THIS PATIENT: 30 minutes.     POSSIBLE D/C today, DEPENDING ON CLINICAL CONDITION.   Abisai Deer M.D on 08/21/2016 at 9:42 AM  Between 7am to 6pm - Pager - [phone] After 6pm go to [email or web address] - password [email or web address]  [facility] [place] [facility]  Office  [phone]  CC: Primary care physician; Patient, No Pcp Per  Note: This dictation was prepared with Dragon dictation along with smaller phrase technology. Any transcriptional errors that result from this process are unintentional.

## 2016-08-23 DIAGNOSIS — N183 Chronic kidney disease, stage 3 (moderate): Secondary | ICD-10-CM

## 2016-08-23 LAB — BASIC METABOLIC PANEL
ANION GAP: 8 (ref 5–15)
BUN: 27 mg/dL — AB (ref 6–20)
CALCIUM: 8.9 mg/dL (ref 8.9–10.3)
CO2: 28 mmol/L (ref 22–32)
CREATININE: 1.22 mg/dL — AB (ref 0.44–1.00)
Chloride: 102 mmol/L (ref 101–111)
GFR calc Af Amer: 50 mL/min — ABNORMAL LOW (ref >60)
GFR, EST NON AFRICAN AMERICAN: 43 mL/min — AB (ref >60)
Glucose, Bld: 117 mg/dL — ABNORMAL HIGH (ref 65–99)
Potassium: 3.9 mmol/L (ref 3.5–5.1)
Sodium: 138 mmol/L (ref 135–145)

## 2016-08-23 LAB — HEPARIN LEVEL (UNFRACTIONATED): HEPARIN UNFRACTIONATED: 0.55 [IU]/mL (ref 0.30–0.70)

## 2016-08-23 MED ORDER — ASPIRIN 81 MG PO CHEW
81.0000 mg | CHEWABLE_TABLET | ORAL | Status: AC
  Administered 2016-08-24: 81 mg via ORAL

## 2016-08-23 MED ORDER — SODIUM CHLORIDE 0.9% FLUSH
3.0000 mL | Freq: Two times a day (BID) | INTRAVENOUS | Status: DC
  Administered 2016-08-23: 3 mL via INTRAVENOUS

## 2016-08-23 MED ORDER — SODIUM CHLORIDE 0.9 % IV SOLN
INTRAVENOUS | Status: DC
  Administered 2016-08-24: 08:00:00 via INTRAVENOUS

## 2016-08-23 MED ORDER — SODIUM CHLORIDE 0.9 % IV SOLN
250.0000 mL | INTRAVENOUS | Status: DC | PRN
Start: 2016-08-23 — End: 2016-08-24

## 2016-08-23 MED ORDER — SODIUM CHLORIDE 0.9% FLUSH
3.0000 mL | INTRAVENOUS | Status: DC | PRN
Start: 2016-08-23 — End: 2016-08-24

## 2016-08-23 NOTE — Progress Notes (Signed)
ANTICOAGULATION CONSULT NOTE  Pharmacy Consult for heparin drip Indication: chest pain/ACS  No Known Allergies  Patient Measurements: Height: 5' (152.4 cm) Weight: 206 lb 5.6 oz (93.6 kg) IBW/kg (Calculated) : 45.5 Heparin Dosing Weight: 69.7kg  Vital Signs: Temp: 97.8 F (36.6 C) (08/16 0745) Temp Source: Oral (08/16 0745) BP: 117/83 (08/16 0900) Pulse Rate: 77 (08/16 0900)  Labs:  Recent Labs  08/19/16 1509 08/19/16 1811 08/19/16 2328 08/20/16 0513 08/20/16 0816  HGB 12.7  --   --  12.0  --   HCT 39.7  --   --  36.4  --   PLT 361  --   --  302  --   APTT 29  --   --   --   --   LABPROT 14.0 13.8  --   --   --   INR 1.08 1.06  --   --   --   HEPARINUNFRC <0.10*  --  0.28*  --  0.44  CREATININE 1.27*  --   --  1.13*  --   TROPONINI 0.39* 0.37* 0.36* 0.34*  --     Estimated Creatinine Clearance: 46.6 mL/min (A) (by C-G formula based on SCr of 1.13 mg/dL (H)).   Medical History: Past Medical History:  Diagnosis Date  . Anginal pain (HCC)   . Automatic implantable cardioverter-defibrillator in situ    a. s/p MDT ICD 07/2012; b. SN # [id number]   . Chronic systolic CHF (congestive heart failure) (HCC)    a. echo 2014: EF 25%, diffuse HK, LA moderately dilated, RA mildly dilated, PASP 38 mm Hg; b. echo 08/2014: EF 25-30%, diffuse HK, RWMA cannot be excluded, mild MR, mild biatrial enlargement, RV mildly dilated, wall thickness nl, pacer wire/catheter noted, mild to mod TR, PASP high nl  . Coronary artery disease, non-occlusive    a. cath 2003: LM nl, mid LAD 50%, LCx nl, RCA nl b. L&RHC 08/17/2014 100% occluded mid LCx, 70% mid LAD with FFR 0.82. Medical therapy. CI 2.1. CO 4.01  . Encounter for long-term (current) use of anticoagulants   . Exertional shortness of breath   . H/O medication noncompliance   . High cholesterol   . Hypertension   . NICM (nonischemic cardiomyopathy) (HCC)    a. s/p AICD 07/22/2012  . Obesity   . OSA on CPAP    "suppose to; not often"  (07/22/2012)  . Other "heavy-for-dates" infants   . PAF (paroxysmal atrial fibrillation) (HCC)    a. on warfarin--> Eliquis 08/2014    Medications:  Scheduled:  . amiodarone      . amiodarone  400 mg Oral BID  . aspirin EC  81 mg Oral Daily  . atorvastatin  40 mg Oral q1800  . carvedilol  6.25 mg Oral BID WC  . furosemide  80 mg Oral BID  . ipratropium-albuterol  3 mL Nebulization Q6H  . mouth rinse  15 mL Mouth Rinse BID  . methimazole  10 mg Oral BID  . nitroGLYCERIN  1 inch Topical Q6H  . potassium chloride SA  20 mEq Oral Daily  . sacubitril-valsartan  1 tablet Oral BID  . sodium chloride flush  3 mL Intravenous Q12H  . spironolactone  12.5 mg Oral Daily    Assessment: Patient is a 71 year old female w/ a PMH of CAD, CHF, Afib on eliquis, ICD who presents with SOB, "weakness in my chest." patient first troponin is elevated at 0.39. Pharmacy consulted to dose heparin drip. Patient  off eliquis since at least Sunday (8/12). Patient currently receiving heparin at 1000 units/hr.   Goal of Therapy:  Heparin level 0.3-0.7 units/ml aPTT 66-102 seconds Monitor platelets by anticoagulation protocol: Yes   Plan:  Continue patient's heparin at 1000 units/hr. Follow up anti-Xa level with am labs.   8/17 @ 0440 HL 0.36 therapeutic. Will continue current rate and will recheck HL w/ am labs.  8/18 @ 0500 HL 0.47 therapeutic. Will continue current rate and will recheck HL w/ am labs.  8/18 @ 0500 HL 0.55 therapeutic. Will continue current rate and recheck w/ am labs.  Per cardiology note, plan is to discharge patient on apixaban (eliquis).  Thomasene Ripple, PharmD, BCPS Clinical Pharmacist 08/23/2016

## 2016-08-23 NOTE — Progress Notes (Signed)
Progress Note  Patient Name: Erin [name] Date of Encounter: 08/23/2016  Primary Cardiologist: Graciela Husbands  Subjective   Reports that she slept better, no complaints, no chest pain  details of catheterization discussed with her  Orders have been placed Echocardiogram discussed with him showing depressed ejection fraction 25% with dilated LV, global hypokinesis   Stress test anterior wall ischemia Scheduled for catheterization tomorrow  Inpatient Medications    Scheduled Meds: . amiodarone  400 mg Oral BID  . aspirin EC  81 mg Oral Daily  . atorvastatin  40 mg Oral q1800  . carvedilol  6.25 mg Oral BID WC  . furosemide  80 mg Oral BID  . ipratropium-albuterol  3 mL Nebulization TID  . mouth rinse  15 mL Mouth Rinse BID  . methimazole  10 mg Oral BID  . nitroGLYCERIN  1 inch Topical Q6H  . potassium chloride SA  20 mEq Oral Daily  . sacubitril-valsartan  1 tablet Oral BID  . sodium chloride flush  3 mL Intravenous Q12H  . sodium chloride flush  3 mL Intravenous Q12H  . spironolactone  12.5 mg Oral Daily   Continuous Infusions: . sodium chloride    . sodium chloride    . sodium chloride    . heparin 1,000 Units/hr (08/22/16 2135)   PRN Meds: sodium chloride, sodium chloride, acetaminophen **OR** acetaminophen, ondansetron **OR** ondansetron (ZOFRAN) IV, sodium chloride flush, sodium chloride flush   Vital Signs    Vitals:   08/22/16 1950 08/22/16 2047 08/23/16 0448 08/23/16 0727  BP: (!) 106/46  111/65 121/79  Pulse: 80  73 84  Resp: 16  18 16   Temp: 98.2 F (36.8 C)  98 F (36.7 C) 98.4 F (36.9 C)  TempSrc: Oral  Axillary Oral  SpO2: 96% 94% 93% 91%  Weight:   202 lb 4.8 oz (91.8 kg)   Height:        Intake/Output Summary (Last 24 hours) at 08/23/16 1252 Last data filed at 08/23/16 1140  Gross per 24 hour  Intake            826.5 ml  Output             2000 ml  Net          -1173.5 ml   Filed Weights   08/21/16 0527 08/22/16 0447 08/23/16 0448    Weight: 203 lb 9.6 oz (92.4 kg) 202 lb 9 oz (91.9 kg) 202 lb 4.8 oz (91.8 kg)    Telemetry    Normal sinus rhythm  - Personally Reviewed  ECG      Physical Exam    GEN: No acute distress, Obese Neck: No JVD Cardiac: RRR, no murmurs, rubs, or gallops.  Respiratory: Clear to auscultation bilaterally. GI: Soft, nontender, non-distended  MS: No edema; No deformity. Neuro:  Nonfocal  Psych: Normal affect   Labs    Chemistry  Recent Labs Lab 08/20/16 0513 08/21/16 0440 08/23/16 0454  NA 143 138 138  K 4.1 3.9 3.9  CL 106 102 102  CO2 30 27 28   GLUCOSE 101* 120* 117*  BUN 16 19 27*  CREATININE 1.13* 1.24* 1.22*  CALCIUM 8.5* 8.6* 8.9  GFRNONAA 48* 43* 43*  GFRAA 55* 49* 50*  ANIONGAP 7 9 8      Hematology  Recent Labs Lab 08/19/16 1509 08/20/16 0513 08/21/16 0440  WBC 8.6 7.6 8.3  RBC 5.29* 4.78 5.14  HGB 12.7 12.0 12.7  HCT 39.7 36.4 38.9  MCV 75.0* 76.2* 75.8*  MCH 24.0* 25.1* 24.7*  MCHC 32.0 32.9 32.6  RDW 17.1* 17.4* 17.2*  PLT 361 302 324    Cardiac Enzymes  Recent Labs Lab 08/19/16 1509 08/19/16 1811 08/19/16 2328 08/20/16 0513  TROPONINI 0.39* 0.37* 0.36* 0.34*   No results for input(s): TROPIPOC in the last 168 hours.   BNP  Recent Labs Lab 08/19/16 1509  BNP 892.0*     DDimer No results for input(s): DDIMER in the last 168 hours.   Radiology    Nm Myocar Multi W/spect W/wall Motion / Ef  Result Date: 08/21/2016 Pharmacological myocardial perfusion imaging study with ISCHEMIA of moderate size, moderate perfusion defect in the mid to apical anterior wall Also with fixed defect in the basal to mid lateral wall. Normal wall motion, EF estimated at 24% No EKG changes concerning for ischemia at peak stress or in recovery. High risk scan Consider cardiac catheterization given known LAD disease by prior cardiac cath Signed, Dossie Arbour, MD, Ph.D [facility]    Cardiac Studies   Echocardiogram with ejection fraction 25%,  dilated LV, moderately elevated right heart pressures   Patient Profile     Erin [name] is a 71 y.o. female who is being seen today for the evaluation of ICD shock at the request of Dr. Winona Legato, MD. Patient has a h/o CAD medically managed, NICM/chronic systolic CHF s/p MDT ICD in 07/2012, PAF on Eliquis and amiodarone, pulmonary hypertension, hyperthyroidism on methimazole, HTN, HLD, OSA noncompliant with CPAP who presented to [facility] with generalized weakness followed by ICD shock.   Assessment & Plan    A/P 1) Acute on chronic systolic CHF  on Lasix by mouth 80 mg twice a day  continue current dose of carvedilol and entresto. Stressed importance of medication compliance She was not taking medications on arrival We will hold Lasix and entresto this evening and tomorrow morning in preparation for catheterization  2) Paroxysmal atrial fibrillation Previously seen by Dr. Graciela Husbands, on amiodarone Maintaining normal sinus rhythm eliquis on hold for catheterization on Monday On heparin infusion  3)Sustained VT ICD shock Suspect exacerbated from acute on chronic systolic CHF, LV dilatation, global hypokinesis with ejection fraction 25% --continue amiodarone 400 milligrams twice a day, oral dosing [employer] has evaluated ICD, changed parameters for treatment of VT --Cardiac cath tomorrow  Elevated troponin Demand ischemia in the setting of known coronary artery disease, ICD shock from VT Positive stress test anterior wall ischemia Scheduled for cardiac catheterization on Monday  hyperthyroidism- Previously seen by endocrine, previously on methimazole  Medication noncompliance Stressed importance of following up in clinic in taking her medications   Greater than 50% was spent in counseling and coordination of care with patient Total encounter time 25 minutes or more  Signed, Julien Nordmann, MD  08/23/2016, 12:52 PM

## 2016-08-23 NOTE — Progress Notes (Signed)
[employer] -  at [facility]   PATIENT NAME: Erin [name]    MR#:  [id number]  DATE OF BIRTH:  1945/08/24  SUBJECTIVE:   No acute issues overnight. No chest pain REVIEW OF SYSTEMS:    Review of Systems  Constitutional: Negative for fever, chills weight loss HENT: Negative for ear pain, nosebleeds, congestion, facial swelling, rhinorrhea, neck pain, neck stiffness and ear discharge.   Respiratory: Negative for cough, shortness of breath, wheezing  Cardiovascular: Negative for chest pain, palpitations and leg swelling.  Gastrointestinal: Negative for heartburn, abdominal pain, vomiting, diarrhea or consitpation Genitourinary: Negative for dysuria, urgency, frequency, hematuria Musculoskeletal: Negative for back pain or joint pain Neurological: Negative for dizziness, seizures, syncope, focal weakness,  numbness and headaches.  Hematological: Does not bruise/bleed easily.  Psychiatric/Behavioral: Negative for hallucinations, confusion, dysphoric mood    Tolerating Diet: yes      DRUG ALLERGIES:  No Known Allergies  VITALS:  Blood pressure 121/79, pulse 84, temperature 98.4 F (36.9 C), temperature source Oral, resp. rate 16, height 5' (1.524 m), weight 91.8 kg (202 lb 4.8 oz), SpO2 91 %.  PHYSICAL EXAMINATION:  Constitutional: Appears well-developed and well-nourished. No distress. HENT: Normocephalic. Marland Kitchen Oropharynx is clear and moist.  Eyes: Conjunctivae and EOM are normal. PERRLA, no scleral icterus.  Neck: Normal ROM. Neck supple. No JVD. No tracheal deviation. CVS: RRR, S1/S2 +, no murmurs, no gallops, no carotid bruit.  Pulmonary: Effort and breath sounds normal, no stridor, rhonchi, wheezes, rales.  Abdominal: Soft. BS +,  no distension, tenderness, rebound or guarding.  Musculoskeletal: Normal range of motion. No edema and no tenderness.  Neuro: Alert. CN 2-12 grossly intact. No focal deficits. Skin: Skin is warm and dry. No rash  noted. Psychiatric: Normal mood and affect.      LABORATORY PANEL:   CBC  Recent Labs Lab 08/21/16 0440  WBC 8.3  HGB 12.7  HCT 38.9  PLT 324   ------------------------------------------------------------------------------------------------------------------  Chemistries   Recent Labs Lab 08/21/16 0440 08/23/16 0454  NA 138 138  K 3.9 3.9  CL 102 102  CO2 27 28  GLUCOSE 120* 117*  BUN 19 27*  CREATININE 1.24* 1.22*  CALCIUM 8.6* 8.9  MG 2.0  --    ------------------------------------------------------------------------------------------------------------------  Cardiac Enzymes  Recent Labs Lab 08/19/16 1811 08/19/16 2328 08/20/16 0513  TROPONINI 0.37* 0.36* 0.34*   ------------------------------------------------------------------------------------------------------------------  RADIOLOGY:  Nm Myocar Multi W/spect W/wall Motion / Ef  Result Date: 08/21/2016 Pharmacological myocardial perfusion imaging study with ISCHEMIA of moderate size, moderate perfusion defect in the mid to apical anterior wall Also with fixed defect in the basal to mid lateral wall. Normal wall motion, EF estimated at 24% No EKG changes concerning for ischemia at peak stress or in recovery. High risk scan Consider cardiac catheterization given known LAD disease by prior cardiac cath Signed, Dossie Arbour, MD, Ph.D [facility]     ASSESSMENT AND PLAN:   71 year old female with known coronary artery disease,paroxsmal atrial fibrillation and chronic systolic heart failure with ICD placed in 2014 who presented with weakness and found to have acute hypoxic respiratory failure in the setting of these fitted and CHF.  1. Acute hypoxic respiratory failure in the setting of Acute on chronic systolic heart failure ejection fraction 25-30%: Continue oral  Monitor intake and output  Patient is euvolemic  Continue with Coreg,entresto and aldactone  She has been weaned off of  oxygen  2.VT/VF with shock delivery by ICD: ICD interrogated and reprogrammed device  so as to deliver therapy rates 170-202 100-240. Continue amiodarone  3. Chronic kidney disease stage III: Creatinine stable  4. Elevated troponin due to demand ischemia and not ACS. Stress test was positive showing anterior wall ischemia Patient is scheduled for cardiac catheterization on Monday   5. Hypokalemia: Repleted 6. Hypothyroidism: Continue methimazole. Patient will need outpatient follow-up with her endocrinologist. 7. Hyperlipidemia: Continue statin     Management plans discussed with the patient and she is in agreement.  CODE STATUS: full  TOTAL TIME TAKING CARE OF THIS PATIENT: 23 minutes.     POSSIBLE D/C Tuesday, DEPENDING ON CLINICAL CONDITION.   [name] M.D on 08/21/2016 at 9:09 AM  Between 7am to 6pm - Pager - [phone] After 6pm go to [email or web address] - password [email or web address]  [facility] [place] [facility]  Office  [phone]  CC: Primary care physician; Patient, No Pcp Per  Note: This dictation was prepared with Dragon dictation along with smaller phrase technology. Any transcriptional errors that result from this process are unintentional.

## 2016-08-24 ENCOUNTER — Encounter
Admission: EM | Disposition: A | Discharge status: Home / Self Care | Payer: Self-pay | Source: Home / Self Care | Attending: Internal Medicine

## 2016-08-24 ENCOUNTER — Encounter: Payer: Self-pay | Admitting: Cardiovascular Disease

## 2016-08-24 DIAGNOSIS — I200 Unstable angina: Secondary | ICD-10-CM

## 2016-08-24 HISTORY — PX: CORONARY STENT INTERVENTION: CATH118234

## 2016-08-24 HISTORY — PX: LEFT HEART CATH AND CORONARY ANGIOGRAPHY: CATH118249

## 2016-08-24 LAB — BASIC METABOLIC PANEL
ANION GAP: 9 (ref 5–15)
BUN: 29 mg/dL — ABNORMAL HIGH (ref 6–20)
CALCIUM: 9.1 mg/dL (ref 8.9–10.3)
CO2: 29 mmol/L (ref 22–32)
Chloride: 100 mmol/L — ABNORMAL LOW (ref 101–111)
Creatinine, Ser: 1.32 mg/dL — ABNORMAL HIGH (ref 0.44–1.00)
GFR calc Af Amer: 46 mL/min — ABNORMAL LOW (ref >60)
GFR calc non Af Amer: 40 mL/min — ABNORMAL LOW (ref >60)
GLUCOSE: 118 mg/dL — AB (ref 65–99)
POTASSIUM: 4.1 mmol/L (ref 3.5–5.1)
Sodium: 138 mmol/L (ref 135–145)

## 2016-08-24 LAB — GLUCOSE, CAPILLARY
GLUCOSE-CAPILLARY: 204 mg/dL — AB (ref 65–99)
Glucose-Capillary: 108 mg/dL — ABNORMAL HIGH (ref 65–99)

## 2016-08-24 LAB — CBC
HEMATOCRIT: 39.4 % (ref 35.0–47.0)
HEMOGLOBIN: 12.8 g/dL (ref 12.0–16.0)
MCH: 24.9 pg — AB (ref 26.0–34.0)
MCHC: 32.6 g/dL (ref 32.0–36.0)
MCV: 76.4 fL — AB (ref 80.0–100.0)
Platelets: 346 10*3/uL (ref 150–440)
RBC: 5.15 MIL/uL (ref 3.80–5.20)
RDW: 17.2 % — ABNORMAL HIGH (ref 11.5–14.5)
WBC: 9.2 10*3/uL (ref 3.6–11.0)

## 2016-08-24 LAB — MRSA PCR SCREENING: MRSA by PCR: NEGATIVE

## 2016-08-24 LAB — POCT ACTIVATED CLOTTING TIME: ACTIVATED CLOTTING TIME: 274 s

## 2016-08-24 LAB — HEPARIN LEVEL (UNFRACTIONATED): Heparin Unfractionated: 0.81 IU/mL — ABNORMAL HIGH (ref 0.30–0.70)

## 2016-08-24 SURGERY — LEFT HEART CATH AND CORONARY ANGIOGRAPHY
Anesthesia: Moderate Sedation

## 2016-08-24 SURGERY — LEFT HEART CATH
Anesthesia: Moderate Sedation | Laterality: Bilateral

## 2016-08-24 MED ORDER — ASPIRIN 81 MG PO CHEW
CHEWABLE_TABLET | ORAL | Status: DC | PRN
  Administered 2016-08-24: 243 mg via ORAL

## 2016-08-24 MED ORDER — FUROSEMIDE 10 MG/ML IJ SOLN
INTRAMUSCULAR | Status: AC
  Filled 2016-08-24: qty 4

## 2016-08-24 MED ORDER — NITROGLYCERIN 1 MG/10 ML FOR IR/CATH LAB
INTRA_ARTERIAL | Status: DC | PRN
  Administered 2016-08-24: 300 ug via INTRACORONARY

## 2016-08-24 MED ORDER — HEPARIN SODIUM (PORCINE) 1000 UNIT/ML IJ SOLN
INTRAMUSCULAR | Status: DC | PRN
  Administered 2016-08-24: 2000 [IU] via INTRAVENOUS
  Administered 2016-08-24: 5000 [IU] via INTRAVENOUS
  Administered 2016-08-24: 4000 [IU] via INTRAVENOUS

## 2016-08-24 MED ORDER — TIROFIBAN HCL IV 12.5 MG/250 ML
INTRAVENOUS | Status: AC
  Filled 2016-08-24: qty 250

## 2016-08-24 MED ORDER — FUROSEMIDE 10 MG/ML IJ SOLN
INTRAMUSCULAR | Status: DC | PRN
  Administered 2016-08-24: 40 mg via INTRAVENOUS

## 2016-08-24 MED ORDER — NITROGLYCERIN IN D5W 200-5 MCG/ML-% IV SOLN
INTRAVENOUS | Status: AC
  Administered 2016-08-24: 10 ug/min via INTRAVENOUS
  Filled 2016-08-24: qty 250

## 2016-08-24 MED ORDER — IPRATROPIUM-ALBUTEROL 0.5-2.5 (3) MG/3ML IN SOLN
RESPIRATORY_TRACT | Status: AC
  Administered 2016-08-24: 3 mL via RESPIRATORY_TRACT
  Filled 2016-08-24: qty 3

## 2016-08-24 MED ORDER — CLOPIDOGREL BISULFATE 75 MG PO TABS
ORAL_TABLET | ORAL | Status: DC | PRN
  Administered 2016-08-24: 600 mg via ORAL

## 2016-08-24 MED ORDER — FUROSEMIDE 40 MG PO TABS
40.0000 mg | ORAL_TABLET | Freq: Two times a day (BID) | ORAL | Status: DC
  Administered 2016-08-25 – 2016-08-26 (×2): 40 mg via ORAL
  Filled 2016-08-24 (×3): qty 1
  Filled 2016-08-24: qty 2

## 2016-08-24 MED ORDER — HYDROCOD POLST-CPM POLST ER 10-8 MG/5ML PO SUER
ORAL | Status: AC
  Administered 2016-08-24: 5 mL via ORAL
  Filled 2016-08-24: qty 5

## 2016-08-24 MED ORDER — LIDOCAINE HCL (PF) 1 % IJ SOLN
INTRAMUSCULAR | Status: AC
  Filled 2016-08-24: qty 30

## 2016-08-24 MED ORDER — TIROFIBAN (AGGRASTAT) BOLUS VIA INFUSION
25.0000 ug/kg | Freq: Once | INTRAVENOUS | Status: AC
  Administered 2016-08-24: 2325 ug via INTRAVENOUS
  Filled 2016-08-24: qty 47

## 2016-08-24 MED ORDER — MIDAZOLAM HCL 2 MG/2ML IJ SOLN
INTRAMUSCULAR | Status: DC | PRN
  Administered 2016-08-24: 1 mg via INTRAVENOUS

## 2016-08-24 MED ORDER — FENTANYL CITRATE (PF) 100 MCG/2ML IJ SOLN
INTRAMUSCULAR | Status: AC
  Filled 2016-08-24: qty 2

## 2016-08-24 MED ORDER — SODIUM CHLORIDE 0.9% FLUSH: 3.0000 mL | INTRAVENOUS | Status: DC | PRN

## 2016-08-24 MED ORDER — TIROFIBAN HCL IV 12.5 MG/250 ML: 0.0750 ug/kg/min | INTRAVENOUS | Status: DC

## 2016-08-24 MED ORDER — ONDANSETRON HCL 4 MG/2ML IJ SOLN
INTRAMUSCULAR | Status: AC
  Administered 2016-08-24: 4 mg via INTRAVENOUS
  Filled 2016-08-24: qty 2

## 2016-08-24 MED ORDER — ASPIRIN 81 MG PO CHEW
CHEWABLE_TABLET | ORAL | Status: AC
  Filled 2016-08-24: qty 3

## 2016-08-24 MED ORDER — NITROGLYCERIN 0.4 MG SL SUBL
SUBLINGUAL_TABLET | SUBLINGUAL | Status: AC
  Filled 2016-08-24: qty 1

## 2016-08-24 MED ORDER — SODIUM CHLORIDE 0.9% FLUSH
3.0000 mL | Freq: Two times a day (BID) | INTRAVENOUS | Status: DC
  Administered 2016-08-24 – 2016-08-25 (×3): 3 mL via INTRAVENOUS

## 2016-08-24 MED ORDER — HEPARIN SODIUM (PORCINE) 1000 UNIT/ML IJ SOLN
INTRAMUSCULAR | Status: AC
  Filled 2016-08-24: qty 1

## 2016-08-24 MED ORDER — NITROGLYCERIN 5 MG/ML IV SOLN
INTRAVENOUS | Status: AC
  Filled 2016-08-24: qty 10

## 2016-08-24 MED ORDER — SODIUM CHLORIDE 0.9 % IV SOLN: 250.0000 mL | INTRAVENOUS | Status: DC | PRN

## 2016-08-24 MED ORDER — FAMOTIDINE 20 MG PO TABS
20.0000 mg | ORAL_TABLET | Freq: Once | ORAL | Status: AC
  Administered 2016-08-24: 20 mg via ORAL
  Filled 2016-08-24: qty 1

## 2016-08-24 MED ORDER — IOPAMIDOL (ISOVUE-300) INJECTION 61%
INTRAVENOUS | Status: DC | PRN
  Administered 2016-08-24: 185 mL via INTRA_ARTERIAL

## 2016-08-24 MED ORDER — CLOPIDOGREL BISULFATE 300 MG PO TABS
ORAL_TABLET | ORAL | Status: AC
  Filled 2016-08-24: qty 2

## 2016-08-24 MED ORDER — VERAPAMIL HCL 2.5 MG/ML IV SOLN
INTRAVENOUS | Status: AC
  Filled 2016-08-24: qty 2

## 2016-08-24 MED ORDER — ASPIRIN 81 MG PO CHEW
CHEWABLE_TABLET | ORAL | Status: AC
  Filled 2016-08-24: qty 1

## 2016-08-24 MED ORDER — HYDROCOD POLST-CPM POLST ER 10-8 MG/5ML PO SUER
5.0000 mL | Freq: Once | ORAL | Status: AC
  Administered 2016-08-24: 5 mL via ORAL

## 2016-08-24 MED ORDER — NITROGLYCERIN IN D5W 200-5 MCG/ML-% IV SOLN
10.0000 ug/min | INTRAVENOUS | Status: DC
  Administered 2016-08-24: 10 ug/min via INTRAVENOUS

## 2016-08-24 MED ORDER — CLOPIDOGREL BISULFATE 75 MG PO TABS
75.0000 mg | ORAL_TABLET | Freq: Every day | ORAL | Status: DC
  Administered 2016-08-25 – 2016-08-26 (×2): 75 mg via ORAL
  Filled 2016-08-24 (×2): qty 1

## 2016-08-24 MED ORDER — ENOXAPARIN SODIUM 40 MG/0.4ML ~~LOC~~ SOLN
40.0000 mg | Freq: Two times a day (BID) | SUBCUTANEOUS | Status: DC
  Administered 2016-08-24 (×2): 40 mg via SUBCUTANEOUS
  Filled 2016-08-24 (×2): qty 0.4

## 2016-08-24 MED ORDER — MIDAZOLAM HCL 2 MG/2ML IJ SOLN
INTRAMUSCULAR | Status: AC
  Filled 2016-08-24: qty 2

## 2016-08-24 SURGICAL SUPPLY — 16 items
BALLN TREK RX 2.5X20 (BALLOONS) ×2
BALLOON TREK RX 2.5X20 (BALLOONS) ×1 IMPLANT
CATH INFINITI 5 FR JL3.5 (CATHETERS) ×2 IMPLANT
CATH INFINITI JR4 5F (CATHETERS) ×2 IMPLANT
CATH OPTITORQUE JACKY 4.0 5F (CATHETERS) ×2 IMPLANT
CATH VISTA GUIDE 6FR XBLAD3.5 (CATHETERS) ×2 IMPLANT
DEVICE INFLAT 30 PLUS (MISCELLANEOUS) ×2 IMPLANT
DEVICE RAD TR BAND REGULAR (VASCULAR PRODUCTS) ×2 IMPLANT
GLIDESHEATH SLEND SS 6F .021 (SHEATH) ×2 IMPLANT
KIT MANI 3VAL PERCEP (MISCELLANEOUS) ×2 IMPLANT
PACK CARDIAC CATH (CUSTOM PROCEDURE TRAY) ×2 IMPLANT
STENT RESOLUTE ONYX 2.5X38 (Permanent Stent) ×2 IMPLANT
STENT RESOLUTE ONYX 2.75X22 (Permanent Stent) ×2 IMPLANT
WIRE HITORQ VERSACORE ST 145CM (WIRE) ×2 IMPLANT
WIRE INTUITION PROPEL ST 180CM (WIRE) ×2 IMPLANT
WIRE ROSEN-J .035X260CM (WIRE) ×2 IMPLANT

## 2016-08-24 NOTE — H&P (View-Only) (Signed)
Progress Note  Patient Name: Erin [name] Date of Encounter: 08/23/2016  Primary Cardiologist: Graciela Husbands  Subjective   Reports that she slept better, no complaints, no chest pain  details of catheterization discussed with her  Orders have been placed Echocardiogram discussed with him showing depressed ejection fraction 25% with dilated LV, global hypokinesis   Stress test anterior wall ischemia Scheduled for catheterization tomorrow  Inpatient Medications    Scheduled Meds: . amiodarone  400 mg Oral BID  . aspirin EC  81 mg Oral Daily  . atorvastatin  40 mg Oral q1800  . carvedilol  6.25 mg Oral BID WC  . furosemide  80 mg Oral BID  . ipratropium-albuterol  3 mL Nebulization TID  . mouth rinse  15 mL Mouth Rinse BID  . methimazole  10 mg Oral BID  . nitroGLYCERIN  1 inch Topical Q6H  . potassium chloride SA  20 mEq Oral Daily  . sacubitril-valsartan  1 tablet Oral BID  . sodium chloride flush  3 mL Intravenous Q12H  . sodium chloride flush  3 mL Intravenous Q12H  . spironolactone  12.5 mg Oral Daily   Continuous Infusions: . sodium chloride    . sodium chloride    . sodium chloride    . heparin 1,000 Units/hr (08/22/16 2135)   PRN Meds: sodium chloride, sodium chloride, acetaminophen **OR** acetaminophen, ondansetron **OR** ondansetron (ZOFRAN) IV, sodium chloride flush, sodium chloride flush   Vital Signs    Vitals:   08/22/16 1950 08/22/16 2047 08/23/16 0448 08/23/16 0727  BP: (!) 106/46  111/65 121/79  Pulse: 80  73 84  Resp: 16  18 16   Temp: 98.2 F (36.8 C)  98 F (36.7 C) 98.4 F (36.9 C)  TempSrc: Oral  Axillary Oral  SpO2: 96% 94% 93% 91%  Weight:   202 lb 4.8 oz (91.8 kg)   Height:        Intake/Output Summary (Last 24 hours) at 08/23/16 1252 Last data filed at 08/23/16 1140  Gross per 24 hour  Intake            826.5 ml  Output             2000 ml  Net          -1173.5 ml   Filed Weights   08/21/16 0527 08/22/16 0447 08/23/16 0448    Weight: 203 lb 9.6 oz (92.4 kg) 202 lb 9 oz (91.9 kg) 202 lb 4.8 oz (91.8 kg)    Telemetry    Normal sinus rhythm  - Personally Reviewed  ECG      Physical Exam    GEN: No acute distress, Obese Neck: No JVD Cardiac: RRR, no murmurs, rubs, or gallops.  Respiratory: Clear to auscultation bilaterally. GI: Soft, nontender, non-distended  MS: No edema; No deformity. Neuro:  Nonfocal  Psych: Normal affect   Labs    Chemistry  Recent Labs Lab 08/20/16 0513 08/21/16 0440 08/23/16 0454  NA 143 138 138  K 4.1 3.9 3.9  CL 106 102 102  CO2 30 27 28   GLUCOSE 101* 120* 117*  BUN 16 19 27*  CREATININE 1.13* 1.24* 1.22*  CALCIUM 8.5* 8.6* 8.9  GFRNONAA 48* 43* 43*  GFRAA 55* 49* 50*  ANIONGAP 7 9 8      Hematology  Recent Labs Lab 08/19/16 1509 08/20/16 0513 08/21/16 0440  WBC 8.6 7.6 8.3  RBC 5.29* 4.78 5.14  HGB 12.7 12.0 12.7  HCT 39.7 36.4 38.9  MCV 75.0* 76.2* 75.8*  MCH 24.0* 25.1* 24.7*  MCHC 32.0 32.9 32.6  RDW 17.1* 17.4* 17.2*  PLT 361 302 324    Cardiac Enzymes  Recent Labs Lab 08/19/16 1509 08/19/16 1811 08/19/16 2328 08/20/16 0513  TROPONINI 0.39* 0.37* 0.36* 0.34*   No results for input(s): TROPIPOC in the last 168 hours.   BNP  Recent Labs Lab 08/19/16 1509  BNP 892.0*     DDimer No results for input(s): DDIMER in the last 168 hours.   Radiology    Nm Myocar Multi W/spect W/wall Motion / Ef  Result Date: 08/21/2016 Pharmacological myocardial perfusion imaging study with ISCHEMIA of moderate size, moderate perfusion defect in the mid to apical anterior wall Also with fixed defect in the basal to mid lateral wall. Normal wall motion, EF estimated at 24% No EKG changes concerning for ischemia at peak stress or in recovery. High risk scan Consider cardiac catheterization given known LAD disease by prior cardiac cath Signed, Dossie Arbour, MD, Ph.D [facility]    Cardiac Studies   Echocardiogram with ejection fraction 25%,  dilated LV, moderately elevated right heart pressures   Patient Profile     Erin [name] is a 71 y.o. female who is being seen today for the evaluation of ICD shock at the request of Dr. Winona Legato, MD. Patient has a h/o CAD medically managed, NICM/chronic systolic CHF s/p MDT ICD in 07/2012, PAF on Eliquis and amiodarone, pulmonary hypertension, hyperthyroidism on methimazole, HTN, HLD, OSA noncompliant with CPAP who presented to [facility] with generalized weakness followed by ICD shock.   Assessment & Plan    A/P 1) Acute on chronic systolic CHF  on Lasix by mouth 80 mg twice a day  continue current dose of carvedilol and entresto. Stressed importance of medication compliance She was not taking medications on arrival We will hold Lasix and entresto this evening and tomorrow morning in preparation for catheterization  2) Paroxysmal atrial fibrillation Previously seen by Dr. Graciela Husbands, on amiodarone Maintaining normal sinus rhythm eliquis on hold for catheterization on Monday On heparin infusion  3)Sustained VT ICD shock Suspect exacerbated from acute on chronic systolic CHF, LV dilatation, global hypokinesis with ejection fraction 25% --continue amiodarone 400 milligrams twice a day, oral dosing [employer] has evaluated ICD, changed parameters for treatment of VT --Cardiac cath tomorrow  Elevated troponin Demand ischemia in the setting of known coronary artery disease, ICD shock from VT Positive stress test anterior wall ischemia Scheduled for cardiac catheterization on Monday  hyperthyroidism- Previously seen by endocrine, previously on methimazole  Medication noncompliance Stressed importance of following up in clinic in taking her medications   Greater than 50% was spent in counseling and coordination of care with patient Total encounter time 25 minutes or more  Signed, Julien Nordmann, MD  08/23/2016, 12:52 PM

## 2016-08-24 NOTE — Progress Notes (Signed)
ANTICOAGULATION CONSULT NOTE  Pharmacy Consult for heparin drip Indication: chest pain/ACS  No Known Allergies  Patient Measurements: Height: 5' (152.4 cm) Weight: 206 lb 5.6 oz (93.6 kg) IBW/kg (Calculated) : 45.5 Heparin Dosing Weight: 69.7kg  Vital Signs: Temp: 97.8 F (36.6 C) (08/16 0745) Temp Source: Oral (08/16 0745) BP: 117/83 (08/16 0900) Pulse Rate: 77 (08/16 0900)  Labs:  Recent Labs  08/19/16 1509 08/19/16 1811 08/19/16 2328 08/20/16 0513 08/20/16 0816  HGB 12.7  --   --  12.0  --   HCT 39.7  --   --  36.4  --   PLT 361  --   --  302  --   APTT 29  --   --   --   --   LABPROT 14.0 13.8  --   --   --   INR 1.08 1.06  --   --   --   HEPARINUNFRC <0.10*  --  0.28*  --  0.44  CREATININE 1.27*  --   --  1.13*  --   TROPONINI 0.39* 0.37* 0.36* 0.34*  --     Estimated Creatinine Clearance: 46.6 mL/min (A) (by C-G formula based on SCr of 1.13 mg/dL (H)).   Medical History: Past Medical History:  Diagnosis Date  . Anginal pain (HCC)   . Automatic implantable cardioverter-defibrillator in situ    a. s/p MDT ICD 07/2012; b. SN # [id number]   . Chronic systolic CHF (congestive heart failure) (HCC)    a. echo 2014: EF 25%, diffuse HK, LA moderately dilated, RA mildly dilated, PASP 38 mm Hg; b. echo 08/2014: EF 25-30%, diffuse HK, RWMA cannot be excluded, mild MR, mild biatrial enlargement, RV mildly dilated, wall thickness nl, pacer wire/catheter noted, mild to mod TR, PASP high nl  . Coronary artery disease, non-occlusive    a. cath 2003: LM nl, mid LAD 50%, LCx nl, RCA nl b. L&RHC 08/17/2014 100% occluded mid LCx, 70% mid LAD with FFR 0.82. Medical therapy. CI 2.1. CO 4.01  . Encounter for long-term (current) use of anticoagulants   . Exertional shortness of breath   . H/O medication noncompliance   . High cholesterol   . Hypertension   . NICM (nonischemic cardiomyopathy) (HCC)    a. s/p AICD 07/22/2012  . Obesity   . OSA on CPAP    "suppose to; not often"  (07/22/2012)  . Other "heavy-for-dates" infants   . PAF (paroxysmal atrial fibrillation) (HCC)    a. on warfarin--> Eliquis 08/2014    Medications:  Scheduled:  . amiodarone      . amiodarone  400 mg Oral BID  . aspirin EC  81 mg Oral Daily  . atorvastatin  40 mg Oral q1800  . carvedilol  6.25 mg Oral BID WC  . furosemide  80 mg Oral BID  . ipratropium-albuterol  3 mL Nebulization Q6H  . mouth rinse  15 mL Mouth Rinse BID  . methimazole  10 mg Oral BID  . nitroGLYCERIN  1 inch Topical Q6H  . potassium chloride SA  20 mEq Oral Daily  . sacubitril-valsartan  1 tablet Oral BID  . sodium chloride flush  3 mL Intravenous Q12H  . spironolactone  12.5 mg Oral Daily    Assessment: Patient is a 71 year old female w/ a PMH of CAD, CHF, Afib on eliquis, ICD who presents with SOB, "weakness in my chest." patient first troponin is elevated at 0.39. Pharmacy consulted to dose heparin drip. Patient  off eliquis since at least Sunday (8/12). Patient currently receiving heparin at 1000 units/hr.   Goal of Therapy:  Heparin level 0.3-0.7 units/ml aPTT 66-102 seconds Monitor platelets by anticoagulation protocol: Yes   Plan:  Continue patient's heparin at 1000 units/hr. Follow up anti-Xa level with am labs.   8/17 @ 0440 HL 0.36 therapeutic. Will continue current rate and will recheck HL w/ am labs.  8/18 @ 0500 HL 0.47 therapeutic. Will continue current rate and will recheck HL w/ am labs.  8/18 @ 0500 HL 0.55 therapeutic. Will continue current rate and recheck w/ am labs.  8/20 @ 0500 HL 0.81 supratherapeutic. Will decrease rate to 750 units/hr and recheck HL @ 1400.  Per cardiology note, plan is to discharge patient on apixaban (eliquis).  Thomasene Ripple, PharmD, BCPS Clinical Pharmacist 08/24/2016

## 2016-08-24 NOTE — Progress Notes (Signed)
[facility] [place] [facility]   PATIENT NAME: Erin [name]    MR#:  [id number]  DATE OF BIRTH:  [birth year]  SUBJECTIVE:   Patient seen earlier this morning before cardiac catheterization. Denied any chest pain. Daughter at bedside REVIEW OF SYSTEMS:    Review of Systems  Constitutional: Negative for fever, chills weight loss HENT: Negative for ear pain, nosebleeds, congestion, facial swelling, rhinorrhea, neck pain, neck stiffness and ear discharge.   Respiratory: Negative for cough, shortness of breath, wheezing  Cardiovascular: Negative for chest pain, palpitations and leg swelling.  Gastrointestinal: Negative for heartburn, abdominal pain, vomiting, diarrhea or consitpation Genitourinary: Negative for dysuria, urgency, frequency, hematuria Musculoskeletal: Negative for back pain or joint pain Neurological: Negative for dizziness, seizures, syncope, focal weakness,  numbness and headaches.  Hematological: Does not bruise/bleed easily.  Psychiatric/Behavioral: Negative for hallucinations, confusion, dysphoric mood    Tolerating Diet: nothing by mouth     DRUG ALLERGIES:  No Known Allergies  VITALS:  Blood pressure (!) 140/114, pulse 87, temperature 98.6 F (37 C), temperature source Oral, resp. rate 20, height 5' (1.524 m), weight 93 kg (205 lb), SpO2 95 %.  PHYSICAL EXAMINATION:  Constitutional: Appears well-developed and well-nourished. No distress. HENT: Normocephalic. Marland Kitchen Oropharynx is clear and moist.  Eyes: Conjunctivae and EOM are normal. PERRLA, no scleral icterus.  Neck: Normal ROM. Neck supple. No JVD. No tracheal deviation. CVS: RRR, S1/S2 +, no murmurs, no gallops, no carotid bruit.  Pulmonary: Effort and breath sounds normal, no stridor, rhonchi, wheezes, rales.  Abdominal: Soft. BS +,  no distension, tenderness, rebound or guarding.  Musculoskeletal: Normal range of motion. No edema and no tenderness.  Neuro: Alert. CN 2-12 grossly  intact. No focal deficits. Skin: Skin is warm and dry. No rash noted. Psychiatric: Normal mood and affect.      LABORATORY PANEL:   CBC  Recent Labs Lab 08/24/16 0501  WBC 9.2  HGB 12.8  HCT 39.4  PLT 346   ------------------------------------------------------------------------------------------------------------------  Chemistries   Recent Labs Lab 08/21/16 0440  08/24/16 0501  NA 138  < > 138  K 3.9  < > 4.1  CL 102  < > 100*  CO2 27  < > 29  GLUCOSE 120*  < > 118*  BUN 19  < > 29*  CREATININE 1.24*  < > 1.32*  CALCIUM 8.6*  < > 9.1  MG 2.0  --   --   < > = values in this interval not displayed. ------------------------------------------------------------------------------------------------------------------  Cardiac Enzymes  Recent Labs Lab 08/19/16 1811 08/19/16 2328 08/20/16 0513  TROPONINI 0.37* 0.36* 0.34*   ------------------------------------------------------------------------------------------------------------------  RADIOLOGY:  No results found.   ASSESSMENT AND PLAN:   71 year old female with known coronary artery disease,paroxsmal atrial fibrillation and chronic systolic heart failure with ICD placed in 2014 who presented with weakness and found to have acute hypoxic respiratory failure in the setting of these fitted and CHF.  1. Acute hypoxic respiratory failure in the setting of Acute on chronic systolic heart failure ejection fraction 25-30%: Continue oral  Monitor intake and output  Patient is euvolemic  Continue with Coreg,entresto and aldactone  She has been weaned off of oxygen  2.VT/VF with shock delivery by ICD: ICD interrogated and reprogrammed device so as to deliver therapy rates 170-202 100-240. Continue amiodarone  3. Chronic kidney disease stage III: Creatinine stable  4. Elevated troponin due to demand ischemia and not ACS. Stress test was positive showing anterior wall ischemia Patient  is scheduled for  cardiac catheterization this morning.  5. Hypokalemia: Repleted 6. Hypothyroidism: Continue methimazole. Patient will need outpatient follow-up with her endocrinologist. 7. Hyperlipidemia: Continue statin  Physical therapy consultation for disposition planning   Management plans discussed with the patient and she is in agreement.  CODE STATUS: full  TOTAL TIME TAKING CARE OF THIS PATIENT: 23 minutes.     POSSIBLE D/C Tuesday, DEPENDING ON CLINICAL CONDITION.   Lafawn Lenoir M.D on 08/21/2016 at 9:20 AM  Between 7am to 6pm - Pager - [phone] After 6pm go to [email or web address] - password [email or web address]  [facility] [place] [facility]  Office  [phone]  CC: Primary care physician; Patient, No Pcp Per  Note: This dictation was prepared with Dragon dictation along with smaller phrase technology. Any transcriptional errors that result from this process are unintentional.

## 2016-08-24 NOTE — Telephone Encounter (Signed)
Pt still admitted.

## 2016-08-24 NOTE — Progress Notes (Signed)
Anticoagulation monitoring(Lovenox):  71yo  female ordered Lovenox 40 mg Q24h  Filed Weights   08/23/16 1641 08/24/16 0334 08/24/16 0804  Weight: 202 lb 8 oz (91.9 kg) 205 lb 3.2 oz (93.1 kg) 205 lb (93 kg)   BMI 40.3   Lab Results  Component Value Date   CREATININE 1.32 (H) 08/24/2016   CREATININE 1.22 (H) 08/23/2016   CREATININE 1.24 (H) 08/21/2016   Estimated Creatinine Clearance: 39.8 mL/min (A) (by C-G formula based on SCr of 1.32 mg/dL (H)). Hemoglobin & Hematocrit     Component Value Date/Time   HGB 12.8 08/24/2016 0501   HCT 39.4 08/24/2016 0501     Per Protocol for Patient with estCrcl > 30 ml/min and BMI > 40, will transition to Lovenox 40 mg Q12h.

## 2016-08-24 NOTE — Progress Notes (Signed)
PT Cancellation Note  Patient Details Name: ERIAL ANGERMEIER MRN: [id number] DOB: [birth year]   Cancelled Treatment:    Reason Eval/Treat Not Completed: Patient at procedure or test/unavailable (Currently off floor for cardiac cath).  Will continue to follow acutely.   Encarnacion Chu PT, DPT 08/24/2016, 9:42 AM

## 2016-08-24 NOTE — Interval H&P Note (Signed)
History and Physical Interval Note:  08/24/2016 8:48 AM  Erin [name]  has presented today for surgery, with the diagnosis of abnormal stress test/VT  The various methods of treatment have been discussed with the patient and family. After consideration of risks, benefits and other options for treatment, the patient has consented to  Procedure(s): LEFT HEART CATH AND CORONARY ANGIOGRAPHY (N/A) as a surgical intervention .  The patient's history has been reviewed, patient examined, no change in status, stable for surgery.  I have reviewed the patient's chart and labs.  Questions were answered to the patient's satisfaction.     Lorine Bears

## 2016-08-24 NOTE — Progress Notes (Signed)
Progress Note  Patient Name: Erin [name] Date of Encounter: 08/24/2016  Primary Cardiologist: Graciela Husbands  Subjective   The patient had cardiac catheterization done today via the right radial artery which showed significant two-vessel coronary artery disease with known chronically occluded left circumflex with right-to-left collaterals. There was progression of mid LAD disease. This was treated successfully with PCI and 2 overlapped drug-eluting stent placement. First diagonal was jailed by the stent with sluggish flow but no significant ostial stenosis. This was thought to be due to distal embolization. The patient had chest tightness with global ST depression on EKG in the recovery area with worsening shortness of breath and volume overload. She was given one dose of IV furosemide and was started on nitroglycerin drip and Aggrastat drip. She improved subsequently. She was transferred to the ICU. He feels much better now and she is chest pain-free.  Inpatient Medications    Scheduled Meds: . amiodarone  400 mg Oral BID  . aspirin EC  81 mg Oral Daily  . atorvastatin  40 mg Oral q1800  . carvedilol  6.25 mg Oral BID WC  . [START ON 08/25/2016] clopidogrel  75 mg Oral Q breakfast  . enoxaparin (LOVENOX) injection  40 mg Subcutaneous BID  . [START ON 08/25/2016] furosemide  40 mg Oral BID  . ipratropium-albuterol  3 mL Nebulization TID  . mouth rinse  15 mL Mouth Rinse BID  . methimazole  10 mg Oral BID  . nitroGLYCERIN      . potassium chloride SA  20 mEq Oral Daily  . sacubitril-valsartan  1 tablet Oral BID  . sodium chloride flush  3 mL Intravenous Q12H  . sodium chloride flush  3 mL Intravenous Q12H  . spironolactone  12.5 mg Oral Daily   Continuous Infusions: . sodium chloride    . sodium chloride    . nitroGLYCERIN 10 mcg/min (08/24/16 1715)  . tirofiban 0.075 mcg/kg/min (08/24/16 1157)   PRN Meds: sodium chloride, sodium chloride, acetaminophen **OR** acetaminophen,  ondansetron **OR** ondansetron (ZOFRAN) IV, sodium chloride flush, sodium chloride flush   Vital Signs    Vitals:   08/24/16 1315 08/24/16 1400 08/24/16 1500 08/24/16 1600  BP: 128/83 117/79 107/79 114/75  Pulse: 82 80 80 81  Resp: (!) 27 (!) 23 (!) 40 (!) 30  Temp:      TempSrc:      SpO2: (!) 82% 95% 96% 98%  Weight:      Height:        Intake/Output Summary (Last 24 hours) at 08/24/16 1725 Last data filed at 08/24/16 1215  Gross per 24 hour  Intake              210 ml  Output             1000 ml  Net             -790 ml   Filed Weights   08/23/16 1641 08/24/16 0334 08/24/16 0804  Weight: 202 lb 8 oz (91.9 kg) 205 lb 3.2 oz (93.1 kg) 205 lb (93 kg)    Telemetry    Normal sinus rhythm  - Personally Reviewed  ECG    Post PCI EKG showed sinus rhythm with frequent PVCs and global ST depression suggestive of ischemia.  Physical Exam    GEN: No acute distress, Obese Neck: No JVD Cardiac: RRR, no murmurs, rubs, or gallops.  Respiratory: Clear to auscultation bilaterally. GI: Soft, nontender, non-distended  MS: No edema; No deformity.  Neuro:  Nonfocal  Psych: Normal affect   Labs    Chemistry  Recent Labs Lab 08/21/16 0440 08/23/16 0454 08/24/16 0501  NA 138 138 138  K 3.9 3.9 4.1  CL 102 102 100*  CO2 27 28 29   GLUCOSE 120* 117* 118*  BUN 19 27* 29*  CREATININE 1.24* 1.22* 1.32*  CALCIUM 8.6* 8.9 9.1  GFRNONAA 43* 43* 40*  GFRAA 49* 50* 46*  ANIONGAP 9 8 9      Hematology  Recent Labs Lab 08/20/16 0513 08/21/16 0440 08/24/16 0501  WBC 7.6 8.3 9.2  RBC 4.78 5.14 5.15  HGB 12.0 12.7 12.8  HCT 36.4 38.9 39.4  MCV 76.2* 75.8* 76.4*  MCH 25.1* 24.7* 24.9*  MCHC 32.9 32.6 32.6  RDW 17.4* 17.2* 17.2*  PLT 302 324 346    Cardiac Enzymes  Recent Labs Lab 08/19/16 1509 08/19/16 1811 08/19/16 2328 08/20/16 0513  TROPONINI 0.39* 0.37* 0.36* 0.34*   No results for input(s): TROPIPOC in the last 168 hours.   BNP  Recent Labs Lab  08/19/16 1509  BNP 892.0*     DDimer No results for input(s): DDIMER in the last 168 hours.   Radiology    No results found.  Cardiac Studies   Echocardiogram with ejection fraction 25%, dilated LV, moderately elevated right heart pressures   Patient Profile     KEYSHIA [name] is a 71 y.o. female who is being seen today for the evaluation of ICD shock at the request of Dr. Winona Legato, MD. Patient has a h/o CAD medically managed, NICM/chronic systolic CHF s/p MDT ICD in 07/2012, PAF on Eliquis and amiodarone, pulmonary hypertension, hyperthyroidism on methimazole, HTN, HLD, OSA noncompliant with CPAP who presented to [facility] with generalized weakness followed by ICD shock.   Assessment & Plan    A/P 1) Acute on chronic systolic CHF The patient had volume overload post PCI likely due to ischemia. This improved with one dose of IV furosemide. Resume oral furosemide tomorrow morning. She is otherwise on optimal medications for heart failure.  2) coronary artery disease with abnormal stress test showing anterior wall ischemia: Cardiac catheterization was done as outlined above with 2 drug-eluting stent placement to the LAD. This was complicated by sluggish flow in a large first diagonal due to suspected embolization. This improved significantly with IV nitroglycerin and Aggrastat. Aggrastat can be discontinued tomorrow and hopefully nitroglycerin can be weaned overnight that she is currently chest pain-free.  2) Paroxysmal atrial fibrillation Previously seen by Dr. Graciela Husbands, on amiodarone Maintaining normal sinus rhythm Eliquis can be resumed tomorrow after stopping Aggrastat to minimize the risk of bleeding.  3)Sustained VT ICD shock Suspect exacerbated from acute on chronic systolic CHF, LV dilatation, global hypokinesis with ejection fraction 25% -- The dose can be decreased to 200 mg twice daily.   hyperthyroidism- Previously seen by endocrine, previously on  methimazole  Medication noncompliance Stressed importance of following up in clinic in taking her medications   Signed, Lorine Bears, MD  08/24/2016, 5:25 PM

## 2016-08-25 LAB — BASIC METABOLIC PANEL
ANION GAP: 9 (ref 5–15)
BUN: 25 mg/dL — ABNORMAL HIGH (ref 6–20)
CALCIUM: 9 mg/dL (ref 8.9–10.3)
CO2: 30 mmol/L (ref 22–32)
Chloride: 98 mmol/L — ABNORMAL LOW (ref 101–111)
Creatinine, Ser: 1.26 mg/dL — ABNORMAL HIGH (ref 0.44–1.00)
GFR calc non Af Amer: 42 mL/min — ABNORMAL LOW (ref >60)
GFR, EST AFRICAN AMERICAN: 48 mL/min — AB (ref >60)
Glucose, Bld: 122 mg/dL — ABNORMAL HIGH (ref 65–99)
POTASSIUM: 4.6 mmol/L (ref 3.5–5.1)
Sodium: 137 mmol/L (ref 135–145)

## 2016-08-25 LAB — PHOSPHORUS: PHOSPHORUS: 5 mg/dL — AB (ref 2.5–4.6)

## 2016-08-25 LAB — MAGNESIUM: Magnesium: 2.2 mg/dL (ref 1.7–2.4)

## 2016-08-25 LAB — POTASSIUM: POTASSIUM: 4.4 mmol/L (ref 3.5–5.1)

## 2016-08-25 MED ORDER — APIXABAN 5 MG PO TABS
5.0000 mg | ORAL_TABLET | Freq: Two times a day (BID) | ORAL | Status: DC
  Administered 2016-08-25 – 2016-08-26 (×2): 5 mg via ORAL
  Filled 2016-08-25 (×2): qty 1

## 2016-08-25 MED ORDER — IPRATROPIUM-ALBUTEROL 0.5-2.5 (3) MG/3ML IN SOLN: 3.0000 mL | RESPIRATORY_TRACT | Status: DC | PRN

## 2016-08-25 MED ORDER — AMIODARONE HCL 200 MG PO TABS
200.0000 mg | ORAL_TABLET | Freq: Two times a day (BID) | ORAL | Status: DC
  Administered 2016-08-25 – 2016-08-26 (×2): 200 mg via ORAL
  Filled 2016-08-25: qty 1

## 2016-08-25 NOTE — Telephone Encounter (Signed)
Patient still admitted.

## 2016-08-25 NOTE — Progress Notes (Signed)
Report called to Serenity K., RN on 2A. Patient transported to room 259 on portable telemetry in rolling recliner chair.

## 2016-08-25 NOTE — Progress Notes (Signed)
[employer] [facility]   PATIENT NAME: Erin [name]    MR#:  [id number]  DATE OF BIRTH:  1945-08-15  SUBJECTIVE:   Patient  Had Cardiac catheterization done yesterday which showed two-vessel coronary artery disease status post PCI. After that She had chest tightness with global ST depression on EKG and shortness of breath due to volume overload. She was given IV Lasix and started on nitroglycerin drip and her symptoms have now improved. Patient reports no chest pain overnight. She has had asymptomatic V. tach overnight.  REVIEW OF SYSTEMS:    Review of Systems  Constitutional: Negative for fever, chills weight loss HENT: Negative for ear pain, nosebleeds, congestion, facial swelling, rhinorrhea, neck pain, neck stiffness and ear discharge.   Respiratory: Negative for cough, shortness of breath, wheezing  Cardiovascular: Negative for chest pain, palpitations and leg swelling.  Gastrointestinal: Negative for heartburn, abdominal pain, vomiting, diarrhea or consitpation Genitourinary: Negative for dysuria, urgency, frequency, hematuria Musculoskeletal: Negative for back pain or joint pain Neurological: Negative for dizziness, seizures, syncope, focal weakness,  numbness and headaches.  Hematological: Does not bruise/bleed easily.  Psychiatric/Behavioral: Negative for hallucinations, confusion, dysphoric mood    Tolerating Diet:  yes     DRUG ALLERGIES:  No Known Allergies  VITALS:  Blood pressure 124/76, pulse 84, temperature 98.4 F (36.9 C), temperature source Axillary, resp. rate 19, height 5' (1.524 m), weight 93 kg (205 lb 0.4 oz), SpO2 95 %.  PHYSICAL EXAMINATION:  Constitutional: Appears well-developed and well-nourished. No distress. HENT: Normocephalic. Marland Kitchen Oropharynx is clear and moist.  Eyes: Conjunctivae and EOM are normal. PERRLA, no scleral icterus.  Neck: Normal ROM. Neck supple. No JVD. No tracheal deviation. CVS: RRR, S1/S2 +, no  murmurs, no gallops, no carotid bruit.  Pulmonary: Effort and breath sounds normal, no stridor, rhonchi, wheezes, rales.  Abdominal: Soft. BS +,  no distension, tenderness, rebound or guarding.  Musculoskeletal: Normal range of motion. No edema and no tenderness.  Neuro: Alert. CN 2-12 grossly intact. No focal deficits. Skin: Skin is warm and dry. No rash noted. Psychiatric: Normal mood and affect.      LABORATORY PANEL:   CBC  Recent Labs Lab 08/24/16 0501  WBC 9.2  HGB 12.8  HCT 39.4  PLT 346   ------------------------------------------------------------------------------------------------------------------  Chemistries   Recent Labs Lab 08/24/16 2323 08/25/16 0329  NA  --  137  K 4.4 4.6  CL  --  98*  CO2  --  30  GLUCOSE  --  122*  BUN  --  25*  CREATININE  --  1.26*  CALCIUM  --  9.0  MG 2.2  --    ------------------------------------------------------------------------------------------------------------------  Cardiac Enzymes  Recent Labs Lab 08/19/16 1811 08/19/16 2328 08/20/16 0513  TROPONINI 0.37* 0.36* 0.34*   ------------------------------------------------------------------------------------------------------------------  RADIOLOGY:  No results found.   ASSESSMENT AND PLAN:   71 year old female with known coronary artery disease,paroxsmal atrial fibrillation and chronic systolic heart failure with ICD placed in 2014 who presented with weakness and found to have acute hypoxic respiratory failure in the setting of these fitted and CHF.  1. Acute hypoxic respiratory failure in the setting of Acute on chronic systolic heart failure ejection fraction 25-30%: Continue oral Lasix Monitor intake and output  Patient is currently euvolemic  Continue with Coreg,entresto and aldactone   2.VT/VF with shock delivery by ICD: ICD interrogated and reprogrammed device so as to deliver therapy rates 170-202 100-240. Continue amiodarone  Follow-up  with cardiology recommendations  3. Chronic kidney disease stage III: Creatinine stable  4. Elevated troponin due to demand ischemia and not ACS. Stress test was positive showing anterior wall ischemia Patient is status post cardiac catheterization showing significant 2 vessel coronary artery disease with known chronically occluded left circumflex with right to left collaterals. There is progression of mid LAD disease and this was treated successfully with PCI and 2 drug-eluting stents. Continue aspirin, statin, Coreg and Plavix  5. Hypokalemia: Repleted 6. Hypothyroidism: Continue methimazole. Patient will need outpatient follow-up with her endocrinologist. 7. Hyperlipidemia: Continue statin  8. PAF: Continue Eliquis  Physical therapy consultation for disposition planning   Management plans discussed with the patient and she is in agreement.  CODE STATUS: full  TOTAL TIME TAKING CARE OF THIS PATIENT: 23 minutes.     POSSIBLE D/C Tomorrow, DEPENDING ON CLINICAL CONDITION.   Joel Cowin M.D on 08/21/2016 at 8:28 AM  Between 7am to 6pm - Pager - [phone] After 6pm go to [email or web address] - password [email or web address]  [facility]  Office  [phone]  CC: Primary care physician; Patient, No Pcp Per  Note: This dictation was prepared with Dragon dictation along with smaller phrase technology. Any transcriptional errors that result from this process are unintentional.

## 2016-08-25 NOTE — Care Management (Signed)
Provided patient with scales. 

## 2016-08-25 NOTE — Consult Note (Addendum)
Patient referred to Cardiac Rehab with dx of Chronic Systolic Heart Failure.  EF 25 - 30%. (Note:  Patient is a non-smoker.) Patient S/P DES to Proximal to Mid LAD with an additional DES overlapping the other stent.  Booklet, "Living with Heart Failure" reviewed with patient.  Patient stated she has been given this book before. I reviewed the definition of Heart Failure and EF.  Reviewed the cath lab results and where stents were placed.  Talked to patient about weighing herself every day and the different heart failure zones.  Patient stated she used to weigh herself every day but has not been able to weigh since her scales are now broken.  Will reach out to Ermalene Searing, Care Manager, to see if we could provide scales for patient.  Stressed the importance of weighing daily and assessing symptoms in order to determine if she should call the doctor.  Stressed the importance of managing heart diesease by self assessment and communication with doctor or heart failure clinic.  Reviewed medications given for heart failure and how these medications work to help treat her heart failure  Condition.  Explained low sodium heart healthy diet to patient.  Patient a little confused as to whether or not she should eat baked potato chips or not.  Will enter an order for dietitian consult.  Reviewed HF Clinic appointment and the importance of keeping this appointment.  Discussed Cardiac Rehab referral with patient. Explained to patient per Medicare guidelines she must be home and stable on HF medications for 6 weeks before stating Cardiac Rehab.  Patient stated she was interested in cardiac rehab if her insurance will cover it.  Patient to have home PT first.  I explained to patient Cardiac Rehab would be an option after she completed in-home PT.  Army Melia, RN, BSN, [facility] Cardiovascular and Pulmonary Nurse Navigator  2:15 p.m. - 3:30 p.m.

## 2016-08-25 NOTE — Telephone Encounter (Signed)
LM for pt to call back to schedule °

## 2016-08-25 NOTE — Care Management (Signed)
RNCM notified by Neysa Bonito with [facility] home care that if patient meets medical necessity they can following.

## 2016-08-25 NOTE — Evaluation (Signed)
Physical Therapy Evaluation Patient Details Name: Erin [name] MRN: [id number] DOB: 10/04/1945 Today's Date: 08/25/2016   History of Present Illness  Pt is a 71 y/o F who presented after electrical shock terminated ventricular fibrillation/v tach.  She was admitted for acute respiratory failure, ventricular fibrillation, demand ischemia.  Pt underwent cardiac cath which showed two-vessel coronary artery disease status post PCI. After that She had chest tightness with global ST depression on EKG and shortness of breath due to volume overload. She was given IV Lasix and started on nitroglycerin drip and her symptoms have now improved. Pt's PMH includes pulmonary hypertension, obesity, AICD placement 07/2012, CHF.    Clinical Impression  Pt admitted with above diagnosis. Pt currently with functional limitations due to the deficits listed below (see PT Problem List). Ms. Vasconez presents with UE and LE weakness.  She requires min guard assist for transfers and to ambulate 80 ft without AD.  SpO2 dropped to 85% on RA with bed mobility.  All other VSS throughout session.  She is independent at baseline with her ambulatory distance limited only due to fatigue.  Pt will benefit from skilled PT to increase their independence and safety with mobility to allow discharge to the venue listed below.      Follow Up Recommendations Home health PT;Supervision/Assistance - 24 hour    Equipment Recommendations  None recommended by PT    Recommendations for Other Services       Precautions / Restrictions Precautions Precautions: Fall;Other (comment) Precaution Comments: Monitor HR, O2, s/p R radial heart cath Restrictions Weight Bearing Restrictions: Yes RUE Weight Bearing: Non weight bearing (due to R radial cath 08/24/16)      Mobility  Bed Mobility Overal bed mobility: Modified Independent             General bed mobility comments: Increased time and effort with heavy use of bed rail to pull  into sitting.  Pt adheres to NWB RUE and does not attempt to use during bed mobility.  SpO2 down to 85% on RA with supine>sit.  Increased to mid 90s on 2L O2.   Transfers Overall transfer level: Needs assistance Equipment used: None Transfers: Sit to/from Stand Sit to Stand: Min guard         General transfer comment: Min guard for safety as pt rises with wide BOS and mild unsteadiness.  Poorly controlled descent to sit in chair at end of session.  Ambulation/Gait Ambulation/Gait assistance: Min guard Ambulation Distance (Feet): 80 Feet Assistive device: None Gait Pattern/deviations: Step-through pattern;Wide base of support Gait velocity: decreased Gait velocity interpretation: Below normal speed for age/gender General Gait Details: Pt ambulates with wide BOS and decreased gait speed but is steady.  SpO2 remains at or above 90% on 2L O2 with cues for pursed lip breathing.  All other VSS throughout session.  Stairs            Wheelchair Mobility    Modified Rankin (Stroke Patients Only)       Balance Overall balance assessment: Needs assistance Sitting-balance support: No upper extremity supported;Feet supported Sitting balance-Leahy Scale: Good     Standing balance support: No upper extremity supported;During functional activity Standing balance-Leahy Scale: Fair Standing balance comment: Pt able to stand without UE support but would likely lose her balance with perturbation                             Pertinent Vitals/Pain Pain Assessment: No/denies  pain    Home Living Family/patient expects to be discharged to:: Private residence Living Arrangements: Children;Other relatives (daughter and son in law ) Available Help at Discharge: Family;Available PRN/intermittently Type of Home: House Home Access: Level entry     Home Layout: Two level;Able to live on main level with bedroom/bathroom Home Equipment: Grab bars - tub/shower;Walker - 4 wheels       Prior Function Level of Independence: Independent         Comments: Pt reports she ambualtes without AD and denies any falls in the past 6 months.  She is ind with bathing, dressing, driving.  Assists family with cooking.      Hand Dominance   Dominant Hand: Left    Extremity/Trunk Assessment   Upper Extremity Assessment Upper Extremity Assessment: RUE deficits/detail (LUE strength grossly 3/5) RUE Deficits / Details: Deferred testing RUE due to s/p R radial cath    Lower Extremity Assessment Lower Extremity Assessment:  (BLE strength grossly 3+/5)       Communication   Communication: No difficulties  Cognition Arousal/Alertness: Awake/alert Behavior During Therapy: WFL for tasks assessed/performed Overall Cognitive Status: Within Functional Limits for tasks assessed                                        General Comments General comments (skin integrity, edema, etc.): Spoke with Eula Listen, PA Cardiologist as per notes pt had been experiencing several runs of VT and PVCs.  Dunn verball cleared pt to be seen for PT Evaluation and OOB activity ambulating in hallway.    Exercises Other Exercises Other Exercises: Encouraged pt to amualte with nursing staff at least 3x/day   Assessment/Plan    PT Assessment Patient needs continued PT services  PT Problem List Decreased strength;Decreased balance;Decreased safety awareness;Decreased knowledge of precautions;Cardiopulmonary status limiting activity       PT Treatment Interventions DME instruction;Gait training;Functional mobility training;Therapeutic activities;Therapeutic exercise;Balance training;Neuromuscular re-education;Patient/family education;Other (comment) (Energy conservation techniques)    PT Goals (Current goals can be found in the Care Plan section)  Acute Rehab PT Goals Patient Stated Goal: to go home PT Goal Formulation: With patient Time For Goal Achievement: 09/08/16 Potential to  Achieve Goals: Good    Frequency Min 2X/week   Barriers to discharge        Co-evaluation               AM-PAC PT "6 Clicks" Daily Activity  Outcome Measure Difficulty turning over in bed (including adjusting bedclothes, sheets and blankets)?: Unable Difficulty moving from lying on back to sitting on the side of the bed? : Unable Difficulty sitting down on and standing up from a chair with arms (e.g., wheelchair, bedside commode, etc,.)?: A Little Help needed moving to and from a bed to chair (including a wheelchair)?: A Little Help needed walking in hospital room?: A Little Help needed climbing 3-5 steps with a railing? : A Little 6 Click Score: 14    End of Session Equipment Utilized During Treatment: Gait belt;Oxygen Activity Tolerance: Patient tolerated treatment well Patient left: in chair;with call bell/phone within reach (no chair alarm avaialble; pt agrees to call nursing staff) Nurse Communication: Mobility status;Other (comment) (RN aware of no chair alarm; SpO2) PT Visit Diagnosis: Muscle weakness (generalized) (M62.81);Unsteadiness on feet (R26.81)    Time: 8144-8185 PT Time Calculation (min) (ACUTE ONLY): 39 min   Charges:   PT Evaluation $  PT Eval Low Complexity: 1 Low PT Treatments $Gait Training: 8-22 mins $Therapeutic Activity: 8-22 mins   PT G Codes:   PT G-Codes **NOT FOR INPATIENT CLASS** Functional Assessment Tool Used: Clinical judgement;AM-PAC 6 Clicks Basic Mobility Functional Limitation: Mobility: Walking and moving around Mobility: Walking and Moving Around Current Status (Z6109): At least 40 percent but less than 60 percent impaired, limited or restricted Mobility: Walking and Moving Around Goal Status [phone]): At least 1 percent but less than 20 percent impaired, limited or restricted    Encarnacion Chu PT, DPT 08/25/2016, 10:52 AM

## 2016-08-25 NOTE — Care Management (Signed)
Note coped from this RNCM from 08/20/16- RNCM met with patient to discuss consult for medication assistance. Patient's sister was at bedside so patient permission was requested and received before assessing this need. Patient states "elivil is 45$" and so are most of the other medications however she states "she will have to give sometime up to be able to afford these medications".  I provided Good Rx card for her to check with other pharmacies and she states/sister states she will be able to do this on her own.  No other RNCM needs. Patient has health insurance and drug coverage. Advised patient to contact her insurance drug plan to discuss the burden- she agreed.

## 2016-08-25 NOTE — Progress Notes (Signed)
Patient Name: Erin [name] Date of Encounter: 08/25/2016  Primary Cardiologist: [facility] Problem List     Principal Problem:   Acute respiratory failure with hypoxia [facility]) Active Problems:   CORONARY ATHEROSCLEROSIS NATIVE CORONARY ARTERY   AICD (automatic cardioverter/defibrillator) present   H/O medication noncompliance   PAF (paroxysmal atrial fibrillation) (HCC)   Acute on chronic systolic CHF (congestive heart failure) (HCC)   Ventricular fibrillation (HCC)   Demand ischemia (HCC)   CKD (chronic kidney disease) stage 3, GFR 30-59 ml/min   Ventricular tachycardia (HCC)   Unstable angina (HCC)     Subjective   Feels much better this morning. No chest pain. Had small amount of bleeding overnight from radial cath site, now resolved. Coughing up yellow sputum, daughter is concerned for possible bronchitis. Now off nitro gtt and Aggrastat. Post cath labs stable.   Inpatient Medications    Scheduled Meds: . amiodarone  400 mg Oral BID  . apixaban  5 mg Oral BID  . aspirin EC  81 mg Oral Daily  . atorvastatin  40 mg Oral q1800  . carvedilol  6.25 mg Oral BID WC  . clopidogrel  75 mg Oral Q breakfast  . furosemide  40 mg Oral BID  . ipratropium-albuterol  3 mL Nebulization TID  . mouth rinse  15 mL Mouth Rinse BID  . methimazole  10 mg Oral BID  . potassium chloride SA  20 mEq Oral Daily  . sacubitril-valsartan  1 tablet Oral BID  . sodium chloride flush  3 mL Intravenous Q12H  . sodium chloride flush  3 mL Intravenous Q12H  . spironolactone  12.5 mg Oral Daily   Continuous Infusions: . sodium chloride    . sodium chloride    . nitroGLYCERIN Stopped (08/25/16 0700)   PRN Meds: sodium chloride, sodium chloride, acetaminophen **OR** acetaminophen, ondansetron **OR** ondansetron (ZOFRAN) IV, sodium chloride flush, sodium chloride flush   Vital Signs    Vitals:   08/25/16 0500 08/25/16 0700 08/25/16 0800 08/25/16 0900  BP:  117/72 124/76 134/87    Pulse:  82 84 85  Resp:  (!) 29 19 20   Temp:   98.4 F (36.9 C)   TempSrc:   Axillary   SpO2:  90% 95% 97%  Weight: 205 lb 0.4 oz (93 kg)     Height:        Intake/Output Summary (Last 24 hours) at 08/25/16 0907 Last data filed at 08/25/16 0600  Gross per 24 hour  Intake            496.5 ml  Output             2175 ml  Net          -1678.5 ml   Filed Weights   08/24/16 0334 08/24/16 0804 08/25/16 0500  Weight: 205 lb 3.2 oz (93.1 kg) 205 lb (93 kg) 205 lb 0.4 oz (93 kg)    Physical Exam    GEN: Well nourished, well developed, in no acute distress.  HEENT: Grossly normal.  Neck: Supple, JVD mildly elevated, no carotid bruits, or masses. Cardiac: RRR, no murmurs, rubs, or gallops. No clubbing, cyanosis, edema.  Radials/DP/PT 2+ and equal bilaterally. Right radial cath site with mild bruising. TR band in place, though not inflated. No active bleeding noted.  Respiratory:  Faint rhonchi. Marland Kitchen GI: Soft, nontender, nondistended, BS + x 4. MS: no deformity or atrophy. Skin: warm and dry, no rash. Neuro:  Strength and  sensation are intact. Psych: AAOx3.  Normal affect.  Labs    CBC  Recent Labs  08/24/16 0501  WBC 9.2  HGB 12.8  HCT 39.4  MCV 76.4*  PLT 346   Basic Metabolic Panel  Recent Labs  08/24/16 0501 08/24/16 2323 08/25/16 0329  NA 138  --  137  K 4.1 4.4 4.6  CL 100*  --  98*  CO2 29  --  30  GLUCOSE 118*  --  122*  BUN 29*  --  25*  CREATININE 1.32*  --  1.26*  CALCIUM 9.1  --  9.0  MG  --  2.2  --   PHOS  --  5.0*  --    Liver Function Tests No results for input(s): AST, ALT, ALKPHOS, BILITOT, PROT, ALBUMIN in the last 72 hours. No results for input(s): LIPASE, AMYLASE in the last 72 hours. Cardiac Enzymes No results for input(s): CKTOTAL, CKMB, CKMBINDEX, TROPONINI in the last 72 hours. BNP Invalid input(s): POCBNP D-Dimer No results for input(s): DDIMER in the last 72 hours. Hemoglobin A1C No results for input(s): HGBA1C in the last 72  hours. Fasting Lipid Panel No results for input(s): CHOL, HDL, LDLCALC, TRIG, CHOLHDL, LDLDIRECT in the last 72 hours. Thyroid Function Tests No results for input(s): TSH, T4TOTAL, T3FREE, THYROIDAB in the last 72 hours.  Invalid input(s): FREET3  Telemetry    NSR with occasional PVCs and 2 short runs of NSVT - Personally Reviewed  ECG    n/a - Personally Reviewed  Radiology    No results found.  Cardiac Studies   LHC 08/24/2016: Conclusion     Prox RCA lesion, 30 %stenosed.  Post Atrio lesion, 25 %stenosed.  Mid Cx lesion, 100 %stenosed.  1st Diag lesion, 40 %stenosed.  A drug eluting stent was successfully placed.  Prox LAD to Mid LAD lesion, 90 %stenosed.  Post intervention, there is a 0% residual stenosis.  LV end diastolic pressure is moderately elevated.   1. Significant 2 vessel coronary artery disease with chronically occluded left circumflex with right-to-left collaterals. Significant progression of mid to proximal diffuse LAD disease. 2. Moderately elevated left ventricular end-diastolic pressure. 3. Successful angioplasty and 2 overlapped drug-eluting stent placement to the mid LAD extending into the proximal segment. The proximal stent jailed a large first diagonal which had sluggish TIMI 2 flow. There was no significant ostial stenosis and most likely the sluggish flow was due to embolization.  Recommendations: the patient had mild chest tightness in the recovery area and EKG showed anterolateral ST depressions suggestive of global ischemia. I suspect that this is likely due to sluggish flow in the large diagonal branch. She was started on nitroglycerin drip. She was given one dose of IV furosemide and Foley catheter was placed. The patient needs to be admitted to the ICU overnight.  She also has productive cough.  Consider treatment with antibiotics. She needs to be in dual antiplatelet therapy for at least one year.   Myoview 08/21/2016: Study  Result   Pharmacological myocardial perfusion imaging study with ISCHEMIA of moderate size, moderate perfusion defect in the mid to apical anterior wall Also with fixed defect in the basal to mid lateral wall. Normal wall motion, EF estimated at 24% No EKG changes concerning for ischemia at peak stress or in recovery. High risk scan Consider cardiac catheterization given known LAD disease by prior cardiac cath    TTE 08/19/2016: Study Conclusions  - Left ventricle: The cavity size was mildly dilated. Systolic  function was severely reduced. The estimated ejection fraction   was in the range of 25% to 30%. Diffuse hypokinesis. Regional   wall motion abnormalities cannot be excluded. Doppler parameters   are consistent with abnormal left ventricular relaxation (grade 1   diastolic dysfunction). - Mitral valve: There was moderate regurgitation. - Left atrium: The atrium was moderately dilated. - Right ventricle: Pacer wire or catheter noted in right ventricle.   Systolic function was normal. - Right atrium: The atrium was mildly dilated. - Tricuspid valve: There was mild-moderate regurgitation. - Pulmonary arteries: Systolic pressure was moderately elevated. PA   peak pressure: 56 mm Hg (S).  Patient Profile     71 y.o. female with history of CAD medically managed, NICM/chronic systolic CHF s/p MDT ICD in 07/2012, PAF on Eliquis and amiodarone, pulmonary hypertension, hyperthyroidism on methimazole, HTN, HLD, OSA noncompliant with CPAP who presented to [facility] with generalized weakness followed by ICD shock.  Assessment & Plan    1. Acute on chronic systolic CHF: -Improved -Continue PO Lasix, Coreg, spironolactone, and Entresto -Strict Is and Os -Daily weights  2. CAD with abnormal stress test showing anterior wall ischemia: -LHC on 8/20 as outlined above with 2 DES to the LAD, complicated by sluggish flow in a large diagonal due to suspected embolization that improved  signifncantly with IV nitro and Aggrastat -Now asymptomatic, off nitro gtt and Aggrastat -Will need triple therapy x 30 days with ASA 81 mg daily, Plavix 75 mg daily, and Eliquis 5 mg bid. On 9/21, she can stop ASA if ok with interventional cardiology and continue Plavix and Eliquis -Coreg as below -Lipitor  3. PAF: -Remains in sinus rhythm -Amiodarone and Coreg as below -Restart Eliquis this evening  4. Sustained VT ICD shock/NSVT: -Suspect exacerbated from acute on chronic systolic CHF, LV dilatation, global hypokinesis with ejection fraction 25% -Device setting have been adjusted per EP -Decrease amiodarone to 200 mg bid per Dr. Kirke Corin, MD -Continue Coreg, consider titration -Magnesium at goal -Potassium at goal -TSH and Free T4 elevated, per IM  5. Possible bronchitis: -Per IM  Signed, Eula Listen, PA-C [facility] Pager: [phone] 08/25/2016, 9:07 AM

## 2016-08-25 NOTE — Progress Notes (Signed)
Pt is still having multi runs of VT, pt is alert and oriented, states no chest pain, not in distress, vital signs WDL, will continue to monitor.

## 2016-08-25 NOTE — Progress Notes (Signed)
Pt is having multi runs of PVC's and runs of VT, notified MD and she ordered electrolytes labs (Mg, K, Ph). Labs drawn and were all WDL. Will continue to monitor patient. Patient is not having any discomfort or symptoms at this time. Pt alert and oriented x 4.

## 2016-08-26 DIAGNOSIS — I200 Unstable angina: Secondary | ICD-10-CM

## 2016-08-26 DIAGNOSIS — I25110 Atherosclerotic heart disease of native coronary artery with unstable angina pectoris: Secondary | ICD-10-CM

## 2016-08-26 MED ORDER — AMIODARONE HCL 200 MG PO TABS: 200.0000 mg | ORAL_TABLET | Freq: Two times a day (BID) | ORAL | 0 refills | Status: DC

## 2016-08-26 MED ORDER — CARVEDILOL 3.125 MG PO TABS: 3.1250 mg | ORAL_TABLET | Freq: Two times a day (BID) | ORAL | Status: DC

## 2016-08-26 MED ORDER — CLOPIDOGREL BISULFATE 75 MG PO TABS: 75.0000 mg | ORAL_TABLET | Freq: Every day | ORAL | 0 refills | Status: DC

## 2016-08-26 MED ORDER — METHIMAZOLE 10 MG PO TABS: 10.0000 mg | ORAL_TABLET | Freq: Two times a day (BID) | ORAL | 0 refills | Status: DC

## 2016-08-26 MED ORDER — DEXTROSE 50 % IV SOLN: 1.0000 | Freq: Once | INTRAVENOUS | Status: DC

## 2016-08-26 MED ORDER — CARVEDILOL 3.125 MG PO TABS: 3.1250 mg | ORAL_TABLET | Freq: Two times a day (BID) | ORAL | 0 refills | Status: DC

## 2016-08-26 MED ORDER — FUROSEMIDE 40 MG PO TABS: 40.0000 mg | ORAL_TABLET | Freq: Two times a day (BID) | ORAL | 0 refills | Status: DC

## 2016-08-26 MED ORDER — ASPIRIN 81 MG PO TBEC: 81.0000 mg | DELAYED_RELEASE_TABLET | Freq: Every day | ORAL | 1 refills | Status: DC

## 2016-08-26 NOTE — Discharge Summary (Addendum)
[facility] [place] at [facility]   PATIENT NAME: Erin [name]    MR#:  [id number]  DATE OF BIRTH:  11-11-1945  DATE OF ADMISSION:  08/19/2016 ADMITTING PHYSICIAN: Katharina Caper, MD  DATE OF DISCHARGE: 08/26/2016  PRIMARY CARE PHYSICIAN: Patient, No Pcp Per    ADMISSION DIAGNOSIS:  Ventricular fibrillation (HCC) [I49.01] NSTEMI (non-ST elevated myocardial infarction) (HCC) [I21.4] Acute on chronic congestive heart failure, unspecified heart failure type (HCC) [I50.9]  DISCHARGE DIAGNOSIS:  Principal Problem:   Acute respiratory failure with hypoxia (HCC) Active Problems:   CORONARY ATHEROSCLEROSIS NATIVE CORONARY ARTERY   AICD (automatic cardioverter/defibrillator) present   H/O medication noncompliance   PAF (paroxysmal atrial fibrillation) (HCC)   Acute on chronic systolic CHF (congestive heart failure) (HCC)   Ventricular fibrillation (HCC)   Demand ischemia (HCC)   CKD (chronic kidney disease) stage 3, GFR 30-59 ml/min   Ventricular tachycardia (HCC)   Unstable angina (HCC)   SECONDARY DIAGNOSIS:   Past Medical History:  Diagnosis Date  . Anginal pain (HCC)   . Automatic implantable cardioverter-defibrillator in situ    a. s/p MDT ICD 07/2012; b. SN # [id number]   . Chronic systolic CHF (congestive heart failure) (HCC)    a. echo 2014: EF 25%, diffuse HK, LA moderately dilated, RA mildly dilated, PASP 38 mm Hg; b. echo 08/2014: EF 25-30%, diffuse HK, RWMA cannot be excluded, mild MR, mild biatrial enlargement, RV mildly dilated, wall thickness nl, pacer wire/catheter noted, mild to mod TR, PASP high nl  . Coronary artery disease, non-occlusive    a. cath 2003: LM nl, mid LAD 50%, LCx nl, RCA nl b. L&RHC 08/17/2014 100% occluded mid LCx, 70% mid LAD with FFR 0.82. Medical therapy. CI 2.1. CO 4.01  . Encounter for long-term (current) use of anticoagulants   . Exertional shortness of breath   . H/O medication noncompliance   . High cholesterol   .  Hypertension   . NICM (nonischemic cardiomyopathy) (HCC)    a. s/p AICD 07/22/2012  . Obesity   . OSA on CPAP    "suppose to; not often" (07/22/2012)  . Other "heavy-for-dates" infants   . PAF (paroxysmal atrial fibrillation) (HCC)    a. on warfarin--> Eliquis 08/2014    HOSPITAL COURSE:  71 year old female with known coronary artery disease,paroxsmal atrial fibrillation and chronic systolic heart failure with ICD placed in 2014 who presented with weakness and found to have acute hypoxic respiratory failure in the setting of these fitted and CHF.  1. Acute hypoxic respiratory failure in the setting of Acute on chronic systolic heart failure ejection fraction 25-30%: She has diuresed well. She does not require oxygen at discharge. Continue oral Lasix She has been referred to CHF clinic. She will follow-up with cardiology as well. Continue with Coreg,entresto and aldactone   2.VT/VF with shock delivery by ICD: ICD interrogated and reprogrammed device so as to deliver therapy rates 170-202 100-240. Continue amiodarone 200 mg twice a day She will need outpatient follow-up with EP.   3. Chronic kidney disease stage III: Creatinine stable  4. Elevated troponin due to demand ischemia and not ACS. Stress test was positive showing anterior wall ischemia, so cardiac catheterization was planned. Cardiac catheterization shows significant 2 vessel coronary artery disease with known chronically occluded left circumflex with right to left collaterals. There is progression of mid LAD disease and this was treated successfully with PCI and 2 drug-eluting stents. She will Continue aspirin, statin, Coreg and Plavix It should  be noted the patient is on several blood thinners including aspirin, Plavix and Eliquis.  She is at higher risk of bleeding and the specific discussed with the patient who acknowledges risks. Aspirin will be discontinued after 30 days as per recommendations by cardiology.  5.  Hypokalemia: Repleted 6. Hypothyroidism: Continue methimazole. Patient will need outpatient follow-up with her endocrinologist. 7. Hyperlipidemia: Continue statin  8. PAF: Continue Eliquis and aspirin for one month. After 1 month aspirin may be discontinued.  DISCHARGE CONDITIONS AND DIET:   Stable for discharge on cardiac diet  CONSULTS OBTAINED:  Treatment Team:  Nahser, Deloris Ping, MD Shane Crutch, MD  DRUG ALLERGIES:  No Known Allergies  DISCHARGE MEDICATIONS:   Current Discharge Medication List    START taking these medications   Details  aspirin EC 81 MG EC tablet Take 1 tablet (81 mg total) by mouth daily. Qty: 30 tablet, Refills: 1    atorvastatin (LIPITOR) 40 MG tablet Take 1 tablet (40 mg total) by mouth daily at 6 PM. Qty: 30 tablet, Refills: 0    clopidogrel (PLAVIX) 75 MG tablet Take 1 tablet (75 mg total) by mouth daily with breakfast. Qty: 30 tablet, Refills: 0      CONTINUE these medications which have CHANGED   Details  amiodarone (PACERONE) 200 MG tablet Take 1 tablet (200 mg total) by mouth 2 (two) times daily. Qty: 60 tablet, Refills: 0    apixaban (ELIQUIS) 5 MG TABS tablet Take 1 tablet (5 mg total) by mouth 2 (two) times daily. Qty: 180 tablet, Refills: 3    carvedilol (COREG) 3.125 MG tablet Take 1 tablet (3.125 mg total) by mouth 2 (two) times daily with a meal. Qty: 60 tablet, Refills: 0    furosemide (LASIX) 40 MG tablet Take 1 tablet (40 mg total) by mouth 2 (two) times daily. Qty: 60 tablet, Refills: 0    KLOR-CON M20 20 MEQ tablet Take 1 tablet (20 mEq total) by mouth daily. APPOINTMENT NEEDED Qty: 30 tablet, Refills: 0    sacubitril-valsartan (ENTRESTO) 24-26 MG Take 1 tablet by mouth 2 (two) times daily. Qty: 60 tablet, Refills: 1    spironolactone (ALDACTONE) 25 MG tablet Take 0.5 tablets (12.5 mg total) by mouth daily. APPOINTMENT NEEDED Qty: 15 tablet, Refills: 0      CONTINUE these medications which have NOT  CHANGED   Details  methimazole (TAPAZOLE) 10 MG tablet TAKE ONE TABLET BY MOUTH TWICE DAILY Qty: 60 tablet, Refills: 2          Today   CHIEF COMPLAINT:   Patient is doing very well this point. Reports the chest pain shortness of breath. Telemetry shows no episodes of arrhythmia   VITAL SIGNS:  Blood pressure (!) 104/58, pulse 79, temperature 98.4 F (36.9 C), temperature source Oral, resp. rate 17, height 5' (1.524 m), weight 93 kg (205 lb 0.4 oz), SpO2 97 %.   REVIEW OF SYSTEMS:  Review of Systems  Constitutional: Negative.  Negative for chills, fever and malaise/fatigue.  HENT: Negative.  Negative for ear discharge, ear pain, hearing loss, nosebleeds and sore throat.   Eyes: Negative.  Negative for blurred vision and pain.  Respiratory: Negative.  Negative for cough, hemoptysis, shortness of breath and wheezing.   Cardiovascular: Negative.  Negative for chest pain, palpitations and leg swelling.  Gastrointestinal: Negative.  Negative for abdominal pain, blood in stool, diarrhea, nausea and vomiting.  Genitourinary: Negative.  Negative for dysuria.  Musculoskeletal: Negative.  Negative for back pain.  Skin:  Negative.   Neurological: Negative for dizziness, tremors, speech change, focal weakness, seizures and headaches.  Endo/Heme/Allergies: Negative.  Does not bruise/bleed easily.  Psychiatric/Behavioral: Negative.  Negative for depression, hallucinations and suicidal ideas.     PHYSICAL EXAMINATION:  GENERAL:  71 y.o.-year-old patient lying in the bed with no acute distress.  NECK:  Supple, no jugular venous distention. No thyroid enlargement, no tenderness.  LUNGS: Normal breath sounds bilaterally, no wheezing, rales,rhonchi  No use of accessory muscles of respiration.  CARDIOVASCULAR: S1, S2 normal. No murmurs, rubs, or gallops.  ABDOMEN: Soft, non-tender, non-distended. Bowel sounds present. No organomegaly or mass.  EXTREMITIES: No pedal edema, cyanosis, or  clubbing.  PSYCHIATRIC: The patient is alert and oriented x 3.  SKIN: No obvious rash, lesion, or ulcer.   DATA REVIEW:   CBC  Recent Labs Lab 08/24/16 0501  WBC 9.2  HGB 12.8  HCT 39.4  PLT 346    Chemistries   Recent Labs Lab 08/24/16 2323 08/25/16 0329  NA  --  137  K 4.4 4.6  CL  --  98*  CO2  --  30  GLUCOSE  --  122*  BUN  --  25*  CREATININE  --  1.26*  CALCIUM  --  9.0  MG 2.2  --     Cardiac Enzymes  Recent Labs Lab 08/19/16 1811 08/19/16 2328 08/20/16 0513  TROPONINI 0.37* 0.36* 0.34*    Microbiology Results  @MICRORSLT48 @  RADIOLOGY:  No results found.    Current Discharge Medication List    START taking these medications   Details  aspirin EC 81 MG EC tablet Take 1 tablet (81 mg total) by mouth daily. Qty: 30 tablet, Refills: 1    atorvastatin (LIPITOR) 40 MG tablet Take 1 tablet (40 mg total) by mouth daily at 6 PM. Qty: 30 tablet, Refills: 0    clopidogrel (PLAVIX) 75 MG tablet Take 1 tablet (75 mg total) by mouth daily with breakfast. Qty: 30 tablet, Refills: 0      CONTINUE these medications which have CHANGED   Details  amiodarone (PACERONE) 200 MG tablet Take 1 tablet (200 mg total) by mouth 2 (two) times daily. Qty: 60 tablet, Refills: 0    apixaban (ELIQUIS) 5 MG TABS tablet Take 1 tablet (5 mg total) by mouth 2 (two) times daily. Qty: 180 tablet, Refills: 3    carvedilol (COREG) 3.125 MG tablet Take 1 tablet (3.125 mg total) by mouth 2 (two) times daily with a meal. Qty: 60 tablet, Refills: 0    furosemide (LASIX) 40 MG tablet Take 1 tablet (40 mg total) by mouth 2 (two) times daily. Qty: 60 tablet, Refills: 0    KLOR-CON M20 20 MEQ tablet Take 1 tablet (20 mEq total) by mouth daily. APPOINTMENT NEEDED Qty: 30 tablet, Refills: 0    sacubitril-valsartan (ENTRESTO) 24-26 MG Take 1 tablet by mouth 2 (two) times daily. Qty: 60 tablet, Refills: 1    spironolactone (ALDACTONE) 25 MG tablet Take 0.5 tablets (12.5 mg  total) by mouth daily. APPOINTMENT NEEDED Qty: 15 tablet, Refills: 0      CONTINUE these medications which have NOT CHANGED   Details  methimazole (TAPAZOLE) 10 MG tablet TAKE ONE TABLET BY MOUTH TWICE DAILY Qty: 60 tablet, Refills: 2           Management plans discussed with the patient and she is in agreement. Stable for discharge home with [facility]  Patient should follow up with cardiology  CODE STATUS:  Code Status Orders        Start     Ordered   08/19/16 1715  Full code  Continuous     08/19/16 1714    Code Status History    Date Active Date Inactive Code Status Order ID Comments User Context   08/17/2014  2:10 PM 08/22/2014  8:57 PM Full Code [id number]  Kathleene Hazel, MD Inpatient   08/15/2014 10:15 PM 08/17/2014  2:10 PM Full Code [id number]  Quintella Reichert, MD Inpatient   08/13/2014  6:23 PM 08/15/2014 10:15 PM Full Code [id number]  Houston Siren, MD Inpatient      TOTAL TIME TAKING CARE OF THIS PATIENT: 38 minutes.    Note: This dictation was prepared with Dragon dictation along with smaller phrase technology. Any transcriptional errors that result from this process are unintentional.  Kaleena Corrow M.D on 08/26/2016 at 12:17 PM  Between 7am to 6pm - Pager - [phone] After 6pm go to [email or web address] - password [email or web address]  [facility]  Office  [phone]  CC: Primary care physician; Patient, No Pcp Per

## 2016-08-26 NOTE — Progress Notes (Signed)
IVs and tele removed from patient. Discharge instructions given to patient and daughter along with hard copy prescriptions. Verbalized understanding. No distress at this time. Daughter is at bedside and will be transporting patient home.

## 2016-08-26 NOTE — Plan of Care (Signed)
Problem: Food- and Nutrition-Related Knowledge Deficit (NB-1.1) Goal: Nutrition education Formal process to instruct or train a patient/client in a skill or to impart knowledge to help patients/clients voluntarily manage or modify food choices and eating behavior to maintain or improve health.  Outcome: Adequate for Discharge Nutrition Education Note  RD consulted for nutrition education regarding new onset CHF.  RD provided "Low Sodium Nutrition Therapy" handout from the [employer]. Reviewed patient's dietary recall. Provided examples on ways to decrease sodium intake in diet. Discouraged intake of processed foods and use of salt shaker. Encouraged fresh fruits and vegetables as well as whole grain sources of carbohydrates to maximize fiber intake.   RD discussed why it is important for patient to adhere to diet recommendations, and emphasized the role of fluids, foods to avoid, and importance of weighing self daily. Teach back method used.  Expect fair compliance.  Body mass index is 40.04 kg/m. Pt meets criteria for obese class III based on current BMI.  Current diet order is heart healthy , patient is consuming approximately 75-100% of meals at this time. Labs and medications reviewed. No further nutrition interventions warranted at this time. RD contact information provided. If additional nutrition issues arise, please re-consult RD.   Erin [name]. Erin Kriesel, MS, RD LDN Inpatient Clinical Dietitian Pager [phone]

## 2016-08-26 NOTE — Progress Notes (Signed)
[employer] - [place] at [facility]   PATIENT NAME: Erin [name]    MR#:  [id number]  DATE OF BIRTH:  February 25, 1945  SUBJECTIVE:   patient doing well this am  Normal sinus rhythm this morning  REVIEW OF SYSTEMS:    Review of Systems  Constitutional: Negative for fever, chills weight loss HENT: Negative for ear pain, nosebleeds, congestion, facial swelling, rhinorrhea, neck pain, neck stiffness and ear discharge.   Respiratory: Negative for cough, shortness of breath, wheezing  Cardiovascular: Negative for chest pain, palpitations and leg swelling.  Gastrointestinal: Negative for heartburn, abdominal pain, vomiting, diarrhea or consitpation Genitourinary: Negative for dysuria, urgency, frequency, hematuria Musculoskeletal: Negative for back pain or joint pain Neurological: Negative for dizziness, seizures, syncope, focal weakness,  numbness and headaches.  Hematological: Does not bruise/bleed easily.  Psychiatric/Behavioral: Negative for hallucinations, confusion, dysphoric mood    Tolerating Diet: yes      DRUG ALLERGIES:  No Known Allergies  VITALS:  Blood pressure (!) 106/59, pulse 75, temperature 98.3 F (36.8 C), temperature source Axillary, resp. rate 16, height 5' (1.524 m), weight 93 kg (205 lb 0.4 oz), SpO2 99 %.  PHYSICAL EXAMINATION:  Constitutional: Appears well-developed and well-nourished. No distress. HENT: Normocephalic. Marland Kitchen Oropharynx is clear and moist.  Eyes: Conjunctivae and EOM are normal. PERRLA, no scleral icterus.  Neck: Normal ROM. Neck supple. No JVD. No tracheal deviation. CVS: RRR, S1/S2 +, no murmurs, no gallops, no carotid bruit.  Pulmonary: Effort and breath sounds normal, no stridor, rhonchi, wheezes, rales.  Abdominal: Soft. BS +,  no distension, tenderness, rebound or guarding.  Musculoskeletal: Normal range of motion. No edema and no tenderness.  Neuro: Alert. CN 2-12 grossly intact. No focal deficits. Skin: Skin is warm  and dry. No rash noted. Psychiatric: Normal mood and affect.      LABORATORY PANEL:   CBC  Recent Labs Lab 08/24/16 0501  WBC 9.2  HGB 12.8  HCT 39.4  PLT 346   ------------------------------------------------------------------------------------------------------------------  Chemistries   Recent Labs Lab 08/24/16 2323 08/25/16 0329  NA  --  137  K 4.4 4.6  CL  --  98*  CO2  --  30  GLUCOSE  --  122*  BUN  --  25*  CREATININE  --  1.26*  CALCIUM  --  9.0  MG 2.2  --    ------------------------------------------------------------------------------------------------------------------  Cardiac Enzymes  Recent Labs Lab 08/19/16 1811 08/19/16 2328 08/20/16 0513  TROPONINI 0.37* 0.36* 0.34*   ------------------------------------------------------------------------------------------------------------------  RADIOLOGY:  No results found.   ASSESSMENT AND PLAN:   71 year old female with known coronary artery disease,paroxsmal atrial fibrillation and chronic systolic heart failure with ICD placed in 2014 who presented with weakness and found to have acute hypoxic respiratory failure in the setting of these fitted and CHF.  1. Acute hypoxic respiratory failure in the setting of Acute on chronic systolic heart failure ejection fraction 25-30%: Continue oral Lasix Monitor intake and output  Patient is currently euvolemic  Continue with Coreg,entresto and aldactone   2.VT/VF with shock delivery by ICD: ICD interrogated and reprogrammed device so as to deliver therapy rates 170-202 100-240. Continue amiodarone 200 mg twice a day  Follow-up with cardiology recommendations  3. Chronic kidney disease stage III: Creatinine stable  4. Elevated troponin due to demand ischemia and not ACS. Stress test was positive showing anterior wall ischemia Patient is status post cardiac catheterization showing significant 2 vessel coronary artery disease with known  chronically occluded left circumflex with  right to left collaterals. There is progression of mid LAD disease and this was treated successfully with PCI and 2 drug-eluting stents. Continue aspirin, statin, Coreg and Plavix  5. Hypokalemia: Repleted 6. Hypothyroidism: Continue methimazole. Patient will need outpatient follow-up with her endocrinologist. 7. Hyperlipidemia: Continue statin  8. PAF: Continue Eliquis and aspirin for one month. After 1 month aspirin may be discontinued.       Management plans discussed with the patient and she is in agreement.  CODE STATUS: full  TOTAL TIME TAKING CARE OF THIS PATIENT: 21 minutes.     POSSIBLE D/C today, DEPENDING ON CLINICAL CONDITION.   Rami Budhu M.D on 08/26/2016 at 8:03 AM  Between 7am to 6pm - Pager - [phone] After 6pm go to [email or web address] - password [email or web address]  [facility]  Office  [phone]  CC: Primary care physician; Patient, No Pcp Per  Note: This dictation was prepared with Dragon dictation along with smaller phrase technology. Any transcriptional errors that result from this process are unintentional.

## 2016-08-26 NOTE — Care Management Important Message (Signed)
Important Message  Patient Details  Name: Erin [name] MRN: [id number] Date of Birth: August 03, 1945   Medicare Important Message Given:  Yes Signed IM notice given    Eber Hong, RN 08/26/2016, 11:11 AM

## 2016-08-26 NOTE — Progress Notes (Signed)
Patient Name: Erin [name] Date of Encounter: 08/26/2016  Primary Cardiologist: [facility] Problem List     Principal Problem:   Acute respiratory failure with hypoxia [facility]) Active Problems:   CORONARY ATHEROSCLEROSIS NATIVE CORONARY ARTERY   AICD (automatic cardioverter/defibrillator) present   H/O medication noncompliance   PAF (paroxysmal atrial fibrillation) (HCC)   Acute on chronic systolic CHF (congestive heart failure) (HCC)   Ventricular fibrillation (HCC)   Demand ischemia (HCC)   CKD (chronic kidney disease) stage 3, GFR 30-59 ml/min   Ventricular tachycardia (HCC)   Unstable angina (HCC)     Subjective   Breathing much improved. Ambulated with PT, recommended HHPT. BP mildly soft in the low 100s systolic this AM. No further ectopy. No chest pain. Tolerating DAPT with anticoagulation.   Inpatient Medications    Scheduled Meds: . amiodarone  200 mg Oral BID  . apixaban  5 mg Oral BID  . aspirin EC  81 mg Oral Daily  . atorvastatin  40 mg Oral q1800  . carvedilol  3.125 mg Oral BID WC  . clopidogrel  75 mg Oral Q breakfast  . furosemide  40 mg Oral BID  . mouth rinse  15 mL Mouth Rinse BID  . methimazole  10 mg Oral BID  . potassium chloride SA  20 mEq Oral Daily  . sacubitril-valsartan  1 tablet Oral BID  . sodium chloride flush  3 mL Intravenous Q12H  . spironolactone  12.5 mg Oral Daily   Continuous Infusions: . sodium chloride     PRN Meds: sodium chloride, acetaminophen **OR** [DISCONTINUED] acetaminophen, ipratropium-albuterol, [DISCONTINUED] ondansetron **OR** ondansetron (ZOFRAN) IV, sodium chloride flush   Vital Signs    Vitals:   08/25/16 2056 08/26/16 0438 08/26/16 0822 08/26/16 1011  BP: 117/77 (!) 106/59 (!) 105/59 (!) 104/58  Pulse: 83 75 80 79  Resp: 16 16 17    Temp: 98.6 F (37 C) 98.3 F (36.8 C) 98.4 F (36.9 C)   TempSrc: Oral Axillary Oral   SpO2: 100% 99% 95% 97%  Weight:      Height:        Intake/Output  Summary (Last 24 hours) at 08/26/16 1104 Last data filed at 08/26/16 0938  Gross per 24 hour  Intake              240 ml  Output                0 ml  Net              240 ml   Filed Weights   08/24/16 0334 08/24/16 0804 08/25/16 0500  Weight: 205 lb 3.2 oz (93.1 kg) 205 lb (93 kg) 205 lb 0.4 oz (93 kg)    Physical Exam    GEN: Well nourished, well developed, in no acute distress.  HEENT: Grossly normal.  Neck: Supple, no JVD, carotid bruits, or masses. Cardiac: RRR, no murmurs, rubs, or gallops. No clubbing, cyanosis, edema.  Radials/DP/PT 2+ and equal bilaterally. Cath site with mild bruising. No bleeding, swelling. Radial pulse 2+.  Respiratory:  Respirations regular and unlabored, clear to auscultation bilaterally. GI: Soft, nontender, nondistended, BS + x 4. MS: no deformity or atrophy. Skin: warm and dry, no rash. Neuro:  Strength and sensation are intact. Psych: AAOx3.  Normal affect.  Labs    CBC  Recent Labs  08/24/16 0501  WBC 9.2  HGB 12.8  HCT 39.4  MCV 76.4*  PLT 346  Basic Metabolic Panel  Recent Labs  08/24/16 0501 08/24/16 2323 08/25/16 0329  NA 138  --  137  K 4.1 4.4 4.6  CL 100*  --  98*  CO2 29  --  30  GLUCOSE 118*  --  122*  BUN 29*  --  25*  CREATININE 1.32*  --  1.26*  CALCIUM 9.1  --  9.0  MG  --  2.2  --   PHOS  --  5.0*  --    Liver Function Tests No results for input(s): AST, ALT, ALKPHOS, BILITOT, PROT, ALBUMIN in the last 72 hours. No results for input(s): LIPASE, AMYLASE in the last 72 hours. Cardiac Enzymes No results for input(s): CKTOTAL, CKMB, CKMBINDEX, TROPONINI in the last 72 hours. BNP Invalid input(s): POCBNP D-Dimer No results for input(s): DDIMER in the last 72 hours. Hemoglobin A1C No results for input(s): HGBA1C in the last 72 hours. Fasting Lipid Panel No results for input(s): CHOL, HDL, LDLCALC, TRIG, CHOLHDL, LDLDIRECT in the last 72 hours. Thyroid Function Tests No results for input(s): TSH, T4TOTAL,  T3FREE, THYROIDAB in the last 72 hours.  Invalid input(s): FREET3  Telemetry    NSR - Personally Reviewed  ECG    n/a - Personally Reviewed  Radiology    No results found.  Cardiac Studies   LHC 08/24/2016: Conclusion     Prox RCA lesion, 30 %stenosed.  Post Atrio lesion, 25 %stenosed.  Mid Cx lesion, 100 %stenosed.  1st Diag lesion, 40 %stenosed.  A drug eluting stent was successfully placed.  Prox LAD to Mid LAD lesion, 90 %stenosed.  Post intervention, there is a 0% residual stenosis.  LV end diastolic pressure is moderately elevated.  1. Significant 2 vessel coronary artery disease with chronically occluded left circumflex with right-to-left collaterals. Significant progression of mid to proximal diffuse LAD disease. 2. Moderately elevated left ventricular end-diastolic pressure. 3. Successful angioplasty and 2 overlapped drug-eluting stent placement to the mid LAD extending into the proximal segment. The proximal stent jailed a large first diagonal which had sluggish TIMI 2 flow. There was no significant ostial stenosis and most likely the sluggish flow was due to embolization.  Recommendations: the patient had mild chest tightness in the recovery area and EKG showed anterolateral ST depressions suggestive of global ischemia. I suspect that this is likely due to sluggish flow in the large diagonal branch. She was started on nitroglycerin drip. She was given one dose of IV furosemide and Foley catheter was placed. The patient needs to be admitted to the ICU overnight.  She also has productive cough. Consider treatment with antibiotics. She needs to be in dual antiplatelet therapy for at least one year.   Myoview 08/21/2016: Study Result   Pharmacological myocardial perfusion imaging study with ISCHEMIA of moderate size, moderate perfusion defect in the mid to apical anterior wall Also with fixed defect in the basal to mid lateral wall. Normal wall motion,  EF estimated at 24% No EKG changes concerning for ischemia at peak stress or in recovery. High risk scan Consider cardiac catheterization given known LAD disease by prior cardiac cath    TTE 08/19/2016: Study Conclusions  - Left ventricle: The cavity size was mildly dilated. Systolic function was severely reduced. The estimated ejection fraction was in the range of 25% to 30%. Diffuse hypokinesis. Regional wall motion abnormalities cannot be excluded. Doppler parameters are consistent with abnormal left ventricular relaxation (grade 1 diastolic dysfunction). - Mitral valve: There was moderate regurgitation. - Left atrium:  The atrium was moderately dilated. - Right ventricle: Pacer wire or catheter noted in right ventricle. Systolic function was normal. - Right atrium: The atrium was mildly dilated. - Tricuspid valve: There was mild-moderate regurgitation. - Pulmonary arteries: Systolic pressure was moderately elevated. PA peak pressure: 56 mm Hg (S).   Patient Profile     71 y.o. female with history of CAD medically managed, NICM/chronic systolic CHF s/p MDT ICD in 07/2012, PAF on Eliquis and amiodarone, pulmonary hypertension, hyperthyroidism on methimazole, HTN, HLD, OSA noncompliant with CPAP who presented to [facility] with generalized weakness followed by ICD shock.  Assessment & Plan    1. Acute on chronic systolic CHF: -Improved -Continue PO Lasix, Coreg, spironolactone, and Entresto  -Coreg decreased to 3.125 mg bid given mildly soft BP -Strict Is and Os -Daily weights  2. CAD with abnormal stress test showing anterior wall ischemia: -LHC on 8/20 as outlined above with 2 DES to the LAD, complicated by sluggish flow in a large diagonal due to suspected embolization that improved signifncantly with IV nitro and Aggrastat -Now asymptomatic, off nitro gtt and Aggrastat -Will need triple therapy x 30 days with ASA 81 mg daily, Plavix 75 mg daily, and Eliquis 5 mg  bid. On 9/21, she can stop ASA if ok with interventional cardiology and continue Plavix and Eliquis -Coreg as above -Lipitor  3. PAF: -Remains in sinus rhythm -Amiodarone and Coreg as below -Tolerating Eliquis (triple therapy until late September as above)  4. Sustained VT ICD shock/NSVT: -Suspect exacerbated from acute on chronic systolic CHF, LV dilatation, global hypokinesis with ejection fraction 25% -Device setting have been adjusted per EP -Continue amiodarone to 200 mg bid per Dr. Kirke Corin, MD, will likely decrease to 200 mg daily on hospital follow up on 8/30 -Continue Coreg, lower dose given soft BP -Magnesium at goal -Potassium at goal -TSH and Free T4 elevated, per IM  5. Possible bronchitis: -Per IM  Signed, Eula Listen, PA-C [facility] Pager: [phone] 08/26/2016, 11:04 AM   Attending Note Patient seen and examined, agree with detailed note above,  Patient presentation and plan discussed on rounds.  EKG lab work, chest x-ray, echocardiogram reviewed independently by myself  Breathing improved, blood pressure low this morning, asymptomatic Denies any chest fullness, abdominal bloating, leg swelling Reports she'll be compliant with her medications She does drive will be up to make her appointments  On physical exam obese, no apparent distress on room air, lungs clear to auscultation bilaterally, heart sounds regular with normal S1-S2 no murmurs appreciated, abdomen obese soft nontender, no significant lower extremity edema  Lab work reviewed showing creatinine 1.26, BUN 25, hematocrit  A/P 1. Acute on chronic systolic CHF: History of Medication noncompliance  Aggressive diuresis this admission, workup including cardiac catheterization, echocardiogram -Continue PO Lasix, Coreg, spironolactone, and Entresto  -Coreg decreased to 3.125 mg bid given mildly soft BP Close follow-up in CHF clinic  2. CAD with abnormal stress test showing anterior wall  ischemia: -LHC on 8/20  with  2 DES to the LAD, complicated by sluggish flow in a large diagonal  -Will need triple therapy x 30 days with ASA 81 mg daily, Plavix 75 mg daily, and Eliquis 5 mg bid. On 9/21, she can stop ASA and continue Plavix and Eliquis -Coreg, Lipitor  3. PAF: -Remains in sinus rhythm -Amiodarone and Coreg  -Tolerating Eliquis (triple therapy until late September as above)  4. Sustained VT ICD shock/NSVT: -Suspect exacerbated from acute on chronic systolic CHF, LV  dilatation, global hypokinesis with ejection fraction 25% -Device setting have been adjusted per EP -Continue amiodarone to 200 mg bid She will follow-up with Dr. Berton Mount, EP   long discussion with patient concerning above and family at the bedside  Greater than 50% was spent in counseling and coordination of care with patient Total encounter time 35 minutes or more   Signed: Dossie Arbour  M.D., Ph.D. [facility]

## 2016-08-26 NOTE — Telephone Encounter (Signed)
Left detailed voicemail message for patient with appointment information and instructions to call back if she has any questions regarding discharge instructions or medications.

## 2016-08-26 NOTE — Telephone Encounter (Signed)
Patient contacted regarding discharge from [facility] on 08/26/16.  Patient understands to follow up with provider Eula Listen PA on 09/03/16 at 08:00 AM at [facility]. Patient understands discharge instructions? Yes Patient understands medications and regiment? Yes Patient understands to bring all medications to this visit? Yes  Patient returning call and confirmed appointment information. She verbalized understanding with no further questions at this time.

## 2016-08-26 NOTE — Telephone Encounter (Signed)
Patient still admitted.

## 2016-08-27 ENCOUNTER — Ambulatory Visit: Payer: Medicare Other | Admitting: Family

## 2016-08-27 ENCOUNTER — Encounter: Payer: Self-pay | Admitting: Cardiovascular Disease

## 2016-08-27 NOTE — Telephone Encounter (Signed)
This encounter was created in error - please disregard.

## 2016-09-02 ENCOUNTER — Encounter: Payer: Self-pay | Admitting: Physician Assistant

## 2016-09-02 NOTE — Progress Notes (Signed)
Cardiology Office Note Date:  09/03/2016  Patient ID:  Erin, [name] [birth year], MRN [id number] PCP:  Patient, No Pcp Per  Cardiologist:  Dr. Graciela Husbands, MD    Chief Complaint: Hospital follow up  History of Present Illness: Erin [name] is a 71 y.o. female with history of CAD as detailed below, mixed NICM/ICM/chronic systolic CHF status post MDT ICD and 07/2012, PAF on Eliquis and amiodarone, pulmonary hypertension, hyperthyroidism on methimazole, hypertension, hyperlipidemia, OSA noncompliant with CPAP, and medication/follow-up noncompliance who presents for hospital follow-up after recent admission to [facility] from 8/15 through 8/22 for ICD shock.   Prior LHC in 2003 showed normal left main, mid LAD 50% stenosed, LCx normal, RCA normal. R/LHC in 08/2014 showed 100% occluded mid LCx, mid LAD 70% with FFR 0.82, CI 2.1, CO 4.01, medical therapy was advised. Prior EF of 25% by echo in 05/2012. Hospitalized in 08/2014 with acute CHF in the context of Afib with RVR. Echo at that time demonstrated an EF of 25% and enlargement of the left atrium. She was transferred to [facility] where she underwent cardiac cath the demonstrated occlusion of the mid LCx and moderate LAD stenosis with a negative FFR as above. Her INR had been subtherapeutic, thus she underwent TEE prior to possible DCCV that showed a left atrial thrombus. Her Coumadin was transitioned to Eliquis during that admission. She had been lost to follow up since 2016. Admitted to [facility] on 08/19/16 after reportedly running out of medications for the 3 weeks prior to her admission. Patient was at a grocery store when she felt a sudden shock. ICD interrogation showed VT/VF lasting approximately 30 seconds terminated by defibrillation. Also one prior episode of VT lasting approximately 5 minutes without shock delivery. Case was discussed with EP and ICD device settings were changed appropriately. Transthoracic echocardiogram showed EF 25-30%, diffuse  hypokinesis, grade 1 diastolic dysfunction, moderate mitral regurgitation, moderately dilated left atrium, normal RV systolic function, mildly dilated right atrium, mild to moderate tricuspid regurgitation, PASP 56. She underwent nuclear stress testing that showed anterior wall ischemia. This was followed by left heart catheterization on 8/20 that showed significant 2 vessel coronary artery disease with chronically occluded left circumflex with right-to-left collaterals. Significant progression of mid to proximal diffuse LAD disease. She underwent successful PCI/DES with 2 overlapping stents placed to the mid LAD extending into the proximal segment. The proximal stent jailed a large D1 which had sluggish TIMI 2 flow. There was no significant ostial stenosis and most likely the sluggish flow was felt due to embolization. Post procedure she was noted to have mild chest tightness with EKG showing anterolateral ST depressions suggestive of global ischemia. Symptoms improved with IV Lasix and Aggrenox. She was adequately diuresed prior to discharge.   Cardiac discharge medications included aspirin, Plavix, Lipitor, amiodarone, I'll request, carvedilol, Lasix, KCl, Entresto, spironolactone.   Discharge labs showed a relatively stable serum creatinine 1.26 (baseline approximately 1.1-1.2), magnesium 2.2, hemoglobin 12.8, platelet 346.   She comes in today stating, he has done well since her hospital discharge. Weights have remained stable in the 196-197 pound range via her home scale. No increased shortness of breath. No lower extremity swelling. She has stable two-pillow orthopnea. No early satiety. She is really watching her salt intake, not applying salt to foods, not eating out at restaurants, not eating frozen foods. No chest pain.She has not missed any doses of her medications. Working with PT. Ready for cardiac rehabilitation.  Inpatient cardiac studies: LHC 08/24/2016: Coronary  Findings   Dominance:  Right  Left Anterior Descending  Vessel is large.  Prox LAD to Mid LAD lesion, 90% stenosed.  Angioplasty: A drug eluting stent was successfully placed. The pre-interventional distal flow is normal (TIMI 3). The post-interventional distal flow is normal (TIMI 3). The intervention was successful . No complications occurred at this lesion.  There is no residual stenosis post intervention.  First Diagonal Branch  1st Diag lesion, 40% stenosed. The lesion is discrete.  Second Diagonal Branch  Vessel is small in size.  Left Circumflex  Vessel is moderate in size. Dist Cx filled by collaterals from RPDA.  Mid Cx lesion, 100% stenosed. The lesion is chronically occluded.  First Obtuse Marginal Branch  Vessel is small in size.  Second Obtuse Marginal Branch  2nd Mrg filled by collaterals from 2nd RPLB.  Right Coronary Artery  Vessel is large.  Prox RCA lesion, 30% stenosed. The lesion is segmental.  Right Posterior Descending Artery  Vessel is moderate in size.  Right Posterior Atrioventricular Branch  Vessel is moderate in size.  Post Atrio lesion, 25% stenosed. The lesion is segmental.  Left Heart   Left Ventricle LV end diastolic pressure is moderately elevated.    Coronary Diagrams   Diagnostic Diagram       Post-Intervention Diagram         Eugenie Birks Myoview 08/21/16: Study Result   Pharmacological myocardial perfusion imaging study with ISCHEMIA of moderate size, moderate perfusion defect in the mid to apical anterior wall Also with fixed defect in the basal to mid lateral wall. Normal wall motion, EF estimated at 24% No EKG changes concerning for ischemia at peak stress or in recovery. High risk scan Consider cardiac catheterization given known LAD disease by prior cardiac cath   TTE 08/20/2016: Study Conclusions  - Left ventricle: The cavity size was mildly dilated. Systolic   function was severely reduced. The estimated ejection fraction   was in the range of  25% to 30%. Diffuse hypokinesis. Regional   wall motion abnormalities cannot be excluded. Doppler parameters   are consistent with abnormal left ventricular relaxation (grade 1   diastolic dysfunction). - Mitral valve: There was moderate regurgitation. - Left atrium: The atrium was moderately dilated. - Right ventricle: Pacer wire or catheter noted in right ventricle.   Systolic function was normal. - Right atrium: The atrium was mildly dilated. - Tricuspid valve: There was mild-moderate regurgitation. - Pulmonary arteries: Systolic pressure was moderately elevated. PA   peak pressure: 56 mm Hg (S).    Past Medical History:  Diagnosis Date  . Anginal pain (HCC)   . Automatic implantable cardioverter-defibrillator in situ    a. s/p MDT ICD 07/2012; b. SN # [id number]   . CAD (coronary artery disease)    a. cath 2003: LM nl, mid LAD 50%, LCx nl, RCA nl b. L&RHC 08/17/2014 100% occluded mid LCx, 70% mid LAD with FFR 0.82. Medical therapy. CI 2.1. CO 4.01c. LHC 8/18: CTO LCx w/ R-L colatts, LAD s/p PCI/DES x 2  . Chronic systolic CHF (congestive heart failure) (HCC)    a. echo 2014: EF 25%, diffuse HK, LA moderately dilated, RA mildly dilated, PASP 38 mm Hg; b. echo 08/2014: EF 25-30%, diffuse HK, RWMA cannot be excluded, mild MR, mild biatrial enlargement, RV mildly dilated, wall thickness nl, pacer wire/catheter noted, mild to mod TR, PASP high nl, c. TTE 8/18: EF 25-30%, diffuse HK, GR1DD, mod MR, mod dilated LA, nl RV sys fxn, PASP  56  . Encounter for long-term (current) use of anticoagulants   . Exertional shortness of breath   . H/O medication noncompliance   . High cholesterol   . Hypertension   . NICM (nonischemic cardiomyopathy) (HCC)    a. s/p AICD 07/22/2012  . Obesity   . OSA on CPAP    "suppose to; not often" (07/22/2012)  . Other "heavy-for-dates" infants   . PAF (paroxysmal atrial fibrillation) (HCC)    a. on warfarin--> Eliquis 08/2014    Past Surgical History:    Procedure Laterality Date  . ABDOMINAL HYSTERECTOMY  ~ 1998   "laparoscopic" (07/22/2012)  . CARDIAC CATHETERIZATION  ? 1990's  . CARDIAC CATHETERIZATION N/A 08/17/2014   Procedure: Right/Left Heart Cath and Coronary Angiography;  Surgeon: Kathleene Hazel, MD;  Location: [facility] INVASIVE CV LAB;  Service: Cardiovascular;  Laterality: N/A;  . CARDIAC DEFIBRILLATOR PLACEMENT  07/22/2012   dual-chamber ICD.  Marland Kitchen CORONARY STENT INTERVENTION N/A 08/24/2016   Procedure: CORONARY STENT INTERVENTION;  Surgeon: Iran Ouch, MD;  Location: [facility] INVASIVE CV LAB;  Service: Cardiovascular;  Laterality: N/A;  . IMPLANTABLE CARDIOVERTER DEFIBRILLATOR IMPLANT N/A 07/22/2012   Procedure: IMPLANTABLE CARDIOVERTER DEFIBRILLATOR IMPLANT;  Surgeon: Marinus Maw, MD;  Location: [facility] CATH LAB;  Service: Cardiovascular;  Laterality: N/A;  . LEFT HEART CATH AND CORONARY ANGIOGRAPHY N/A 08/24/2016   Procedure: LEFT HEART CATH AND CORONARY ANGIOGRAPHY;  Surgeon: Iran Ouch, MD;  Location: [facility] INVASIVE CV LAB;  Service: Cardiovascular;  Laterality: N/A;  . TEE WITHOUT CARDIOVERSION N/A 08/21/2014   Procedure: TRANSESOPHAGEAL ECHOCARDIOGRAM (TEE);  Surgeon: Chilton Si, MD;  Location: [facility] ENDOSCOPY;  Service: Cardiovascular;  Laterality: N/A;  . TUBAL LIGATION  ~ 1978    Current Meds  Medication Sig  . amiodarone (PACERONE) 200 MG tablet Take 1 tablet (200 mg total) by mouth 2 (two) times daily.  Marland Kitchen apixaban (ELIQUIS) 5 MG TABS tablet Take 1 tablet (5 mg total) by mouth 2 (two) times daily.  Marland Kitchen aspirin EC 81 MG EC tablet Take 1 tablet (81 mg total) by mouth daily.  Marland Kitchen atorvastatin (LIPITOR) 40 MG tablet Take 1 tablet (40 mg total) by mouth daily at 6 PM.  . carvedilol (COREG) 3.125 MG tablet Take 1 tablet (3.125 mg total) by mouth 2 (two) times daily with a meal.  . clopidogrel (PLAVIX) 75 MG tablet Take 1 tablet (75 mg total) by mouth daily with breakfast.  . furosemide (LASIX) 40 MG tablet Take 1 tablet  (40 mg total) by mouth 2 (two) times daily.  Marland Kitchen KLOR-CON M20 20 MEQ tablet Take 1 tablet (20 mEq total) by mouth daily. APPOINTMENT NEEDED  . methimazole (TAPAZOLE) 10 MG tablet Take 1 tablet (10 mg total) by mouth 2 (two) times daily.  . sacubitril-valsartan (ENTRESTO) 24-26 MG Take 1 tablet by mouth 2 (two) times daily.  Marland Kitchen spironolactone (ALDACTONE) 25 MG tablet Take 0.5 tablets (12.5 mg total) by mouth daily. APPOINTMENT NEEDED    Allergies:   Patient has no known allergies.   Social History:  The patient  reports that she has never smoked. She has never used smokeless tobacco. She reports that she does not drink alcohol or use drugs.   Family History:  The patient's family history includes Diabetes in her sister; Heart disease in her father and mother; Heart failure in her brother; Lung disease in her sister.  ROS:   Review of Systems  Constitutional: Positive for malaise/fatigue. Negative for chills, diaphoresis, fever and weight loss.  HENT: Negative for congestion.   Eyes: Negative for discharge and redness.  Respiratory: Negative for cough, hemoptysis, sputum production, shortness of breath and wheezing.   Cardiovascular: Negative for chest pain, palpitations, orthopnea, claudication, leg swelling and PND.  Gastrointestinal: Negative for abdominal pain, blood in stool, heartburn, melena, nausea and vomiting.  Genitourinary: Negative for hematuria.  Musculoskeletal: Negative for falls and myalgias.  Skin: Negative for rash.  Neurological: Negative for dizziness, tingling, tremors, sensory change, speech change, focal weakness, loss of consciousness and weakness.  Endo/Heme/Allergies: Does not bruise/bleed easily.  Psychiatric/Behavioral: Negative for substance abuse. The patient is not nervous/anxious.   All other systems reviewed and are negative.    PHYSICAL EXAM:  VS:  BP 118/68 (BP Location: Left Arm, Patient Position: Sitting, Cuff Size: Normal)   Pulse 84   Ht 5' (1.524  m)   Wt 197 lb 8 oz (89.6 kg)   BMI 38.57 kg/m  BMI: Body mass index is 38.57 kg/m.  Physical Exam  Constitutional: She is oriented to person, place, and time. She appears well-developed and well-nourished.  HENT:  Head: Normocephalic and atraumatic.  Eyes: Right eye exhibits no discharge. Left eye exhibits no discharge.  Neck: Normal range of motion. No JVD present.  Cardiovascular: Normal rate, regular rhythm, S1 normal, S2 normal and normal heart sounds.  Exam reveals no distant heart sounds, no friction rub, no midsystolic click and no opening snap.   No murmur heard. Pulses:      Posterior tibial pulses are 2+ on the right side, and 2+ on the left side.  Pulmonary/Chest: Effort normal and breath sounds normal. No respiratory distress. She has no decreased breath sounds. She has no wheezes. She has no rales. She exhibits no tenderness.  Abdominal: Soft. She exhibits no distension. There is no tenderness.  Musculoskeletal: She exhibits no edema.  Neurological: She is alert and oriented to person, place, and time.  Skin: Skin is warm and dry. No cyanosis. Nails show no clubbing.  Psychiatric: She has a normal mood and affect. Her speech is normal and behavior is normal. Judgment and thought content normal.     EKG:  Was ordered and interpreted by me today. Shows NSR, 84 bpm, left axis deviation, low voltage QRS nonspecific ST-T changes  Recent Labs: 08/19/2016: B Natriuretic Peptide 892.0; TSH 4.902 08/24/2016: Hemoglobin 12.8; Magnesium 2.2; Platelets 346 08/25/2016: BUN 25; Creatinine, Ser 1.26; Potassium 4.6; Sodium 137  No results found for requested labs within last 8760 hours.   Estimated Creatinine Clearance: 40.8 mL/min (A) (by C-G formula based on SCr of 1.26 mg/dL (H)).   Wt Readings from Last 3 Encounters:  09/03/16 197 lb 8 oz (89.6 kg)  08/25/16 205 lb 0.4 oz (93 kg)  11/14/14 193 lb (87.5 kg)     Other studies reviewed: Additional studies/records reviewed today  include: summarized above  ASSESSMENT AND PLAN:  1. CAD in native coronary arteri without angina: no symptoms concerning for angina. She will remain on triple therapy with aspirin, Plavix, and Eliquis through September, 20th. On September, 21st, she will discontinue aspirin per interventional cardiology recommendations and remain on both Plavix and Eliquis. No plans for further ischemic evaluation at this time.  2. Mixed ICM/NICM/chronic systolic CHF/pulmonary hypertension: she does not appear volume overloaded at this time. Continue carvedilol, Entresto, and spironolactone. CHF education provided. Continue daily weights. Call for weight gain greater than 3 pounds overnight or 5 pounds in a 7 day period. Repeat limited echocardiogram in 2 months to  evaluate for improvement in LV systolic function status post PCI as above and medication compliance. Recommend compliance with CPAP.  3. Status post ICD: follow-up with EP in 1 month.Get in touch with [employer] to make certain patient has a home monitoring device. No recent ICD shocks or alarms.  4. PAF: Currently in sinus rhythm with well-controlled heart rate. Continue carvedilol. Decrease amiodarone to 200 mg daily. Continue Eliquis 5 mg bid. CHADS2VASc at least 6 (CHF, HTN, age x 2, vascular disease, female).  5. Hypertension: Well controlled. Continue current medications as above.   6. Hyperlipidemia: Continue Lipitor. Recommend fasting lipid panel at follow-up.   Disposition: F/u with Dr. Graciela Husbands in 1 month and Dr. Mariah Milling in 2-3 months.   Current medicines are reviewed at length with the patient today.  The patient did not have any concerns regarding medicines.  Erin Dodge PA-C 09/03/2016 8:24 AM     [facility] [place] [address] [place], [place] [address] [phone]

## 2016-09-03 ENCOUNTER — Encounter: Payer: Self-pay | Admitting: Physician Assistant

## 2016-09-03 ENCOUNTER — Ambulatory Visit (INDEPENDENT_AMBULATORY_CARE_PROVIDER_SITE_OTHER): Payer: Medicare Other | Admitting: Physician Assistant

## 2016-09-03 VITALS — BP 118/68 | HR 84 | Ht 60.0 in | Wt 197.5 lb

## 2016-09-03 DIAGNOSIS — E782 Mixed hyperlipidemia: Secondary | ICD-10-CM | POA: Diagnosis not present

## 2016-09-03 DIAGNOSIS — I2510 Atherosclerotic heart disease of native coronary artery without angina pectoris: Secondary | ICD-10-CM | POA: Diagnosis not present

## 2016-09-03 DIAGNOSIS — I4891 Unspecified atrial fibrillation: Secondary | ICD-10-CM | POA: Diagnosis not present

## 2016-09-03 DIAGNOSIS — I5022 Chronic systolic (congestive) heart failure: Secondary | ICD-10-CM | POA: Diagnosis not present

## 2016-09-03 DIAGNOSIS — I480 Paroxysmal atrial fibrillation: Secondary | ICD-10-CM | POA: Diagnosis not present

## 2016-09-03 DIAGNOSIS — I10 Essential (primary) hypertension: Secondary | ICD-10-CM

## 2016-09-03 DIAGNOSIS — I255 Ischemic cardiomyopathy: Secondary | ICD-10-CM

## 2016-09-03 MED ORDER — AMIODARONE HCL 200 MG PO TABS: 200.0000 mg | ORAL_TABLET | Freq: Every day | ORAL | 6 refills | Status: DC

## 2016-09-03 MED ORDER — ASPIRIN 81 MG PO TBEC: 81.0000 mg | DELAYED_RELEASE_TABLET | Freq: Every day | ORAL | 0 refills | Status: DC

## 2016-09-03 NOTE — Patient Instructions (Addendum)
Medication Instructions:  Your physician has recommended you make the following change in your medication:  1. DECREASE Amiodarone to 200 mg once daily 2. STOP Aspirin on 09/25/16   Labwork: We will call you with your lab results.   Testing/Procedures: Your physician has requested that you have an echocardiogram end of October. Echocardiography is a painless test that uses sound waves to create images of your heart. It provides your doctor with information about the size and shape of your heart and how well your heart's chambers and valves are working. This procedure takes approximately one hour. There are no restrictions for this procedure.    Follow-Up: Your physician recommends that you schedule a follow-up appointment in: 1 month with Dr. Graciela Husbands (Pt has device AICD)  Your physician recommends that you schedule a follow-up appointment in: End of November with Dr. Mariah Milling.  It was a pleasure seeing you today here in the office. Please do not hesitate to give [place] a call back if you have any further questions. [phone]  [place] Cellar RN, BSN    Echocardiogram An echocardiogram, or echocardiography, uses sound waves (ultrasound) to produce an image of your heart. The echocardiogram is simple, painless, obtained within a short period of time, and offers valuable information to your health care provider. The images from an echocardiogram can provide information such as:  Evidence of coronary artery disease (CAD).  Heart size.  Heart muscle function.  Heart valve function.  Aneurysm detection.  Evidence of a past heart attack.  Fluid buildup around the heart.  Heart muscle thickening.  Assess heart valve function.  Tell a health care provider about:  Any allergies you have.  All medicines you are taking, including vitamins, herbs, eye drops, creams, and over-the-counter medicines.  Any problems you or family members have had with anesthetic medicines.  Any blood  disorders you have.  Any surgeries you have had.  Any medical conditions you have.  Whether you are pregnant or may be pregnant. What happens before the procedure? No special preparation is needed. Eat and drink normally. What happens during the procedure?  In order to produce an image of your heart, gel will be applied to your chest and a wand-like tool (transducer) will be moved over your chest. The gel will help transmit the sound waves from the transducer. The sound waves will harmlessly bounce off your heart to allow the heart images to be captured in real-time motion. These images will then be recorded.  You may need an IV to receive a medicine that improves the quality of the pictures. What happens after the procedure? You may return to your normal schedule including diet, activities, and medicines, unless your health care provider tells you otherwise. This information is not intended to replace advice given to you by your health care provider. Make sure you discuss any questions you have with your health care provider. Document Released: 12/20/1999 Document Revised: 08/10/2015 Document Reviewed: 08/29/2012 Elsevier Interactive Patient Education  2017 [employer].

## 2016-09-04 ENCOUNTER — Telehealth: Payer: Self-pay | Admitting: *Deleted

## 2016-09-04 DIAGNOSIS — I5023 Acute on chronic systolic (congestive) heart failure: Secondary | ICD-10-CM

## 2016-09-04 DIAGNOSIS — I4891 Unspecified atrial fibrillation: Secondary | ICD-10-CM

## 2016-09-04 LAB — BASIC METABOLIC PANEL
BUN/Creatinine Ratio: 20 (ref 12–28)
BUN: 30 mg/dL — AB (ref 8–27)
CALCIUM: 9.7 mg/dL (ref 8.7–10.3)
CHLORIDE: 96 mmol/L (ref 96–106)
CO2: 23 mmol/L (ref 20–29)
CREATININE: 1.52 mg/dL — AB (ref 0.57–1.00)
GFR calc Af Amer: 39 mL/min/{1.73_m2} — ABNORMAL LOW (ref >59)
GFR calc non Af Amer: 34 mL/min/{1.73_m2} — ABNORMAL LOW (ref >59)
GLUCOSE: 100 mg/dL — AB (ref 65–99)
Potassium: 4.6 mmol/L (ref 3.5–5.2)
Sodium: 137 mmol/L (ref 134–144)

## 2016-09-04 MED ORDER — FUROSEMIDE 40 MG PO TABS: 40.0000 mg | ORAL_TABLET | Freq: Every day | ORAL | 0 refills | Status: DC

## 2016-09-04 NOTE — Telephone Encounter (Signed)
-----   Message from Sondra Barges, PA-C sent at 09/04/2016  7:52 AM EDT ----- Please call the patient. Renal function slightly increased from baseline. Decrease Lasix to 40 mg daily. Recheck bmet in 1 week. Please get patient scheduled for cardiac rehab.

## 2016-09-04 NOTE — Telephone Encounter (Signed)
Reviewed results and recommendations with patient. Instructed her to decrease Furosemide to 40 mg once daily and then go to [facility] next Thursday for repeat labs to be done. Also let her know that I entered referral for cardiac rehab and that someone would be calling her to get this set up. She verbalized understanding of our conversation, agreement with plan, and had no further questions at this time.

## 2016-09-11 ENCOUNTER — Inpatient Hospital Stay: Admit: 2016-09-11 | Payer: Self-pay

## 2016-09-11 ENCOUNTER — Other Ambulatory Visit
Admission: RE | Admit: 2016-09-11 | Discharge: 2016-09-11 | Disposition: A | Discharge status: Home / Self Care | Payer: Medicare Other | Source: Ambulatory Visit | Attending: Physician Assistant | Admitting: Physician Assistant

## 2016-09-11 DIAGNOSIS — I4891 Unspecified atrial fibrillation: Secondary | ICD-10-CM | POA: Insufficient documentation

## 2016-09-11 DIAGNOSIS — I5023 Acute on chronic systolic (congestive) heart failure: Secondary | ICD-10-CM | POA: Diagnosis present

## 2016-09-11 LAB — BASIC METABOLIC PANEL
ANION GAP: 9 (ref 5–15)
BUN: 26 mg/dL — ABNORMAL HIGH (ref 6–20)
CHLORIDE: 102 mmol/L (ref 101–111)
CO2: 23 mmol/L (ref 22–32)
Calcium: 9.5 mg/dL (ref 8.9–10.3)
Creatinine, Ser: 1.16 mg/dL — ABNORMAL HIGH (ref 0.44–1.00)
GFR calc non Af Amer: 46 mL/min — ABNORMAL LOW (ref >60)
GFR, EST AFRICAN AMERICAN: 54 mL/min — AB (ref >60)
GLUCOSE: 101 mg/dL — AB (ref 65–99)
Potassium: 4.7 mmol/L (ref 3.5–5.1)
Sodium: 134 mmol/L — ABNORMAL LOW (ref 135–145)

## 2016-09-17 ENCOUNTER — Telehealth: Payer: Self-pay | Admitting: Cardiovascular Disease

## 2016-09-17 NOTE — Telephone Encounter (Signed)
Orders signed online through [facility].

## 2016-09-17 NOTE — Telephone Encounter (Signed)
Left voicemail message that we can sign orders through portal and verbal order is also approved for moving discharge date and to call back if he should need any further assistance.

## 2016-09-17 NOTE — Telephone Encounter (Signed)
Brayton Caves from [facility] calling needing a verbal order  To move discharge visit to next week  Due to patient being out of town  Please advise

## 2016-09-21 ENCOUNTER — Ambulatory Visit (INDEPENDENT_AMBULATORY_CARE_PROVIDER_SITE_OTHER): Payer: Medicare Other | Admitting: Internal Medicine

## 2016-09-21 ENCOUNTER — Encounter: Payer: Self-pay | Admitting: Internal Medicine

## 2016-09-21 VITALS — BP 120/60 | HR 84 | Ht 60.0 in | Wt 204.8 lb

## 2016-09-21 DIAGNOSIS — I420 Dilated cardiomyopathy: Secondary | ICD-10-CM | POA: Diagnosis not present

## 2016-09-21 DIAGNOSIS — I480 Paroxysmal atrial fibrillation: Secondary | ICD-10-CM

## 2016-09-21 DIAGNOSIS — I5022 Chronic systolic (congestive) heart failure: Secondary | ICD-10-CM | POA: Diagnosis not present

## 2016-09-21 DIAGNOSIS — I428 Other cardiomyopathies: Secondary | ICD-10-CM

## 2016-09-21 DIAGNOSIS — Z79899 Other long term (current) drug therapy: Secondary | ICD-10-CM | POA: Diagnosis not present

## 2016-09-21 DIAGNOSIS — Z95810 Presence of automatic (implantable) cardiac defibrillator: Secondary | ICD-10-CM

## 2016-09-21 LAB — CUP PACEART INCLINIC DEVICE CHECK
Battery Remaining Longevity: 79 mo
Battery Voltage: 2.97 V
Brady Statistic AP VS Percent: 0 %
Brady Statistic AS VS Percent: 99.96 %
Date Time Interrogation Session: 20180917141528
HighPow Impedance: 64 Ohm
Implantable Lead Location: 753860
Implantable Lead Model: 5076
Implantable Lead Model: 6935
Implantable Pulse Generator Implant Date: 20140718
Lead Channel Impedance Value: 304 Ohm
Lead Channel Impedance Value: 456 Ohm
Lead Channel Pacing Threshold Amplitude: 0.5 V
Lead Channel Pacing Threshold Amplitude: 1 V
Lead Channel Pacing Threshold Pulse Width: 0.4 ms
Lead Channel Sensing Intrinsic Amplitude: 4.375 mV
Lead Channel Setting Pacing Amplitude: 2 V
Lead Channel Setting Pacing Amplitude: 2.5 V
Lead Channel Setting Pacing Pulse Width: 0.4 ms
MDC IDC LEAD IMPLANT DT: 20140718
MDC IDC LEAD IMPLANT DT: 20140718
MDC IDC LEAD LOCATION: 753859
MDC IDC MSMT LEADCHNL RA PACING THRESHOLD PULSEWIDTH: 0.4 ms
MDC IDC MSMT LEADCHNL RV IMPEDANCE VALUE: 342 Ohm
MDC IDC MSMT LEADCHNL RV SENSING INTR AMPL: 14.5 mV
MDC IDC SET LEADCHNL RV SENSING SENSITIVITY: 0.3 mV
MDC IDC STAT BRADY AP VP PERCENT: 0 %
MDC IDC STAT BRADY AS VP PERCENT: 0.03 %
MDC IDC STAT BRADY RA PERCENT PACED: 0.01 %
MDC IDC STAT BRADY RV PERCENT PACED: 0.04 %

## 2016-09-21 MED ORDER — METHIMAZOLE 10 MG PO TABS: 10.0000 mg | ORAL_TABLET | Freq: Two times a day (BID) | ORAL | 3 refills | Status: DC

## 2016-09-21 MED ORDER — FUROSEMIDE 40 MG PO TABS: 40.0000 mg | ORAL_TABLET | Freq: Every day | ORAL | 6 refills | Status: DC

## 2016-09-21 MED ORDER — SPIRONOLACTONE 25 MG PO TABS: 12.5000 mg | ORAL_TABLET | Freq: Every day | ORAL | 3 refills | Status: DC

## 2016-09-21 MED ORDER — CLOPIDOGREL BISULFATE 75 MG PO TABS: 75.0000 mg | ORAL_TABLET | Freq: Every day | ORAL | 6 refills | Status: DC

## 2016-09-21 MED ORDER — ATORVASTATIN CALCIUM 40 MG PO TABS: 40.0000 mg | ORAL_TABLET | Freq: Every day | ORAL | 6 refills | Status: DC

## 2016-09-21 MED ORDER — CARVEDILOL 3.125 MG PO TABS: 3.1250 mg | ORAL_TABLET | Freq: Two times a day (BID) | ORAL | 6 refills | Status: DC

## 2016-09-21 NOTE — Patient Instructions (Signed)
Medication Instructions: - Your physician has recommended you make the following change in your medication:  1) STOP aspirin 2) Lasix (furosemide) 40 mg- take an extra 80 mg when you get home today & take 80 mg in the morning, resume normal dosing on Wednesday  Labwork: - Your physician recommends that you have lab work today: TSH/ CMET/ CBC  Procedures/Testing: - none ordered  Follow-Up: - Remote monitoring is used to monitor your Pacemaker of ICD from home. This monitoring reduces the number of office visits required to check your device to one time per year. It allows [place] to keep an eye on the functioning of your device to ensure it is working properly. You are scheduled for a device check from home on 12/21/16. You may send your transmission at any time that day. If you have a wireless device, the transmission will be sent automatically. After your physician reviews your transmission, you will receive a postcard with your next transmission date.  - Your physician recommends that you schedule a follow-up appointment in: 3 months with Dr. Graciela Husbands.   Any Additional Special Instructions Will Be Listed Below (If Applicable).     If you need a refill on your cardiac medications before your next appointment, please call your pharmacy.

## 2016-09-21 NOTE — Progress Notes (Signed)
Patient Care Team: Patient, No Pcp Per as PCP - General (General Practice) Delma Freeze, FNP as Nurse Practitioner (Cardiology) Duke Salvia, MD as Consulting Physician (Cardiology) Wendall Stade, MD as Consulting Physician (Cardiology)   HPI  Erin [name] is a 71 y.o. female Seen in followup for ICD implantation the context of a nonischemic and ischemic cardiomyopathy.echocardiogram 5/14 EF 25%  She has chronic systolic heart failure in the context of atrial fibrillation-paroxysmal. She took amiodarone and apixoban./  Amiio was then stopped.  She had developed amio assoc hyperthyroidism--Rx with methimazole Saw Dr Everardo All         Date          TSH      ALT     8/18           4.9       2016         DATE TEST    8/16    Echo   EF 25 % (44/2.1/35)m LAE  8/18 Echo EF 20-25%     She was hospitalized 8/16 having presented with acute congestive heart failure in the context of atrial fibrillation with rapid rate.. Echocardiogram demonstrated an ejection fraction 25%.  Left atrial enlargement was noted   She has treated hypothyroidism. Her TSH is elevated and she is still on methimazole.  She was hospitalized 8/18 following ICD discharge. The strips were reviewed. She had sustained ventricular tachycardia which failed with antitachycardia pacing. She also had nonsustained ventricular tachycardia below detection. Amiodarone was initiated.    She underwent catheterization and was found to have high-grade LAD disease and underwent stenting-DES. She was treated with dual antiplatelet therapy.  Past Medical History:  Diagnosis Date  . Anginal pain (HCC)   . Automatic implantable cardioverter-defibrillator in situ    a. s/p MDT ICD 07/2012; b. SN # [id number]   . CAD (coronary artery disease)    a. cath 2003: LM nl, mid LAD 50%, LCx nl, RCA nl b. L&RHC 08/17/2014 100% occluded mid LCx, 70% mid LAD with FFR 0.82. Medical therapy. CI 2.1. CO 4.01c. LHC 8/18: CTO LCx w/ R-L  colatts, LAD s/p PCI/DES x 2  . Chronic systolic CHF (congestive heart failure) (HCC)    a. echo 2014: EF 25%, diffuse HK, LA moderately dilated, RA mildly dilated, PASP 38 mm Hg; b. echo 08/2014: EF 25-30%, diffuse HK, RWMA cannot be excluded, mild MR, mild biatrial enlargement, RV mildly dilated, wall thickness nl, pacer wire/catheter noted, mild to mod TR, PASP high nl, c. TTE 8/18: EF 25-30%, diffuse HK, GR1DD, mod MR, mod dilated LA, nl RV sys fxn, PASP 56  . Encounter for long-term (current) use of anticoagulants   . Exertional shortness of breath   . H/O medication noncompliance   . High cholesterol   . Hypertension   . NICM (nonischemic cardiomyopathy) (HCC)    a. s/p AICD 07/22/2012  . Obesity   . OSA on CPAP    "suppose to; not often" (07/22/2012)  . Other "heavy-for-dates" infants   . PAF (paroxysmal atrial fibrillation) (HCC)    a. on warfarin--> Eliquis 08/2014    Past Surgical History:  Procedure Laterality Date  . ABDOMINAL HYSTERECTOMY  ~ 1998   "laparoscopic" (07/22/2012)  . CARDIAC CATHETERIZATION  ? 1990's  . CARDIAC CATHETERIZATION N/A 08/17/2014   Procedure: Right/Left Heart Cath and Coronary Angiography;  Surgeon: Kathleene Hazel, MD;  Location: [facility] INVASIVE CV LAB;  Service: Cardiovascular;  Laterality: N/A;  . CARDIAC DEFIBRILLATOR PLACEMENT  07/22/2012   dual-chamber ICD.  Marland Kitchen CORONARY STENT INTERVENTION N/A 08/24/2016   Procedure: CORONARY STENT INTERVENTION;  Surgeon: Iran Ouch, MD;  Location: [facility] INVASIVE CV LAB;  Service: Cardiovascular;  Laterality: N/A;  . IMPLANTABLE CARDIOVERTER DEFIBRILLATOR IMPLANT N/A 07/22/2012   Procedure: IMPLANTABLE CARDIOVERTER DEFIBRILLATOR IMPLANT;  Surgeon: Marinus Maw, MD;  Location: [facility] CATH LAB;  Service: Cardiovascular;  Laterality: N/A;  . LEFT HEART CATH AND CORONARY ANGIOGRAPHY N/A 08/24/2016   Procedure: LEFT HEART CATH AND CORONARY ANGIOGRAPHY;  Surgeon: Iran Ouch, MD;  Location: [facility] INVASIVE CV LAB;   Service: Cardiovascular;  Laterality: N/A;  . TEE WITHOUT CARDIOVERSION N/A 08/21/2014   Procedure: TRANSESOPHAGEAL ECHOCARDIOGRAM (TEE);  Surgeon: Chilton Si, MD;  Location: [facility] ENDOSCOPY;  Service: Cardiovascular;  Laterality: N/A;  . TUBAL LIGATION  ~ 1978    Current Outpatient Prescriptions  Medication Sig Dispense Refill  . amiodarone (PACERONE) 200 MG tablet Take 1 tablet (200 mg total) by mouth daily. 60 tablet 6  . apixaban (ELIQUIS) 5 MG TABS tablet Take 1 tablet (5 mg total) by mouth 2 (two) times daily. 180 tablet 3  . aspirin 81 MG EC tablet Take 1 tablet (81 mg total) by mouth daily. 22 tablet 0  . atorvastatin (LIPITOR) 40 MG tablet Take 1 tablet (40 mg total) by mouth daily at 6 PM. 30 tablet 0  . carvedilol (COREG) 3.125 MG tablet Take 1 tablet (3.125 mg total) by mouth 2 (two) times daily with a meal. 60 tablet 0  . clopidogrel (PLAVIX) 75 MG tablet Take 1 tablet (75 mg total) by mouth daily with breakfast. 30 tablet 0  . furosemide (LASIX) 40 MG tablet Take 1 tablet (40 mg total) by mouth daily. 60 tablet 0  . methimazole (TAPAZOLE) 10 MG tablet Take 1 tablet (10 mg total) by mouth 2 (two) times daily. 60 tablet 0  . sacubitril-valsartan (ENTRESTO) 24-26 MG Take 1 tablet by mouth 2 (two) times daily. 60 tablet 1  . spironolactone (ALDACTONE) 25 MG tablet Take 0.5 tablets (12.5 mg total) by mouth daily. APPOINTMENT NEEDED 15 tablet 0   No current facility-administered medications for this visit.     No Known Allergies  Review of Systems negative except from HPI and PMH  Physical Exam BP 120/60 (BP Location: Left Arm, Patient Position: Sitting, Cuff Size: Normal)   Pulse 84   Ht 5' (1.524 m)   Wt 204 lb 12 oz (92.9 kg)   BMI 39.99 kg/m  Well developed and Morbidly obese in no acute distress HENT normal Neck supple with JVP-8 Clear Regular rate and rhythm, no murmurs or gallops Abd-soft with active BS No Clubbing cyanosis 1+edema Skin-warm and dry A &  Oriented  Grossly normal sensory and motor function   ECG demonstrates  sinus at 86 Intervals 18/09/42  ICD strips were interrogated as above   Assessment and  Plan  Paroxysmal atrial fibrillation  Nonischemic cardiomyopathy  Coronary artery disease-2 vessel with recent stenting  Ventricular tachycardia-treated  Implantable defibrillator-[employer]  Treated hyperthyroidism  Congestive heart failure-acute/chronic-systolic   No intercurrent atrial fibrillation or flutter  No intercurrent Ventricular tachycardia-we have reprogrammed the defibrillator as the amiodarone would be expected to slow down ventricular tachycardia cycle length.  It occurred in the context of worsening heart failure which may have resulted in long-term mechanical interactions. She is also noted to have significant coronary disease for which she underwent stenting. We'll stop her aspirin at this  point. She'll continue on apixoban and clopidogrel  She is mildly volume overloaded. Will have her take extra furosemide today and tomorrow; 80 mg each morning. She is to see heart failure clinic on Wednesday.  We will recheck her TSH. She developed hyperthyroidism on amiodarone which she is treated with methimazole. Is not clear what the effects of amiodarone will be on her thyroid at this time. We will also check her liver function tests. There was a note in the old chart that she had transaminase elevation associated with this his previously.  Without symptoms of ischemia  On Anticoagulation;  No bleeding issues

## 2016-09-22 LAB — TSH: TSH: 0.754 u[IU]/mL (ref 0.450–4.500)

## 2016-09-22 LAB — CBC WITH DIFFERENTIAL/PLATELET
BASOS: 1 %
Basophils Absolute: 0 10*3/uL (ref 0.0–0.2)
EOS (ABSOLUTE): 0.5 10*3/uL — ABNORMAL HIGH (ref 0.0–0.4)
Eos: 10 %
HEMOGLOBIN: 11.8 g/dL (ref 11.1–15.9)
Hematocrit: 35.6 % (ref 34.0–46.6)
IMMATURE GRANS (ABS): 0 10*3/uL (ref 0.0–0.1)
Immature Granulocytes: 0 %
LYMPHS ABS: 1.5 10*3/uL (ref 0.7–3.1)
LYMPHS: 31 %
MCH: 24.4 pg — AB (ref 26.6–33.0)
MCHC: 33.1 g/dL (ref 31.5–35.7)
MCV: 74 fL — AB (ref 79–97)
MONOCYTES: 9 %
Monocytes Absolute: 0.4 10*3/uL (ref 0.1–0.9)
NEUTROS ABS: 2.3 10*3/uL (ref 1.4–7.0)
Neutrophils: 49 %
Platelets: 237 10*3/uL (ref 150–379)
RBC: 4.84 x10E6/uL (ref 3.77–5.28)
RDW: 18.1 % — ABNORMAL HIGH (ref 12.3–15.4)
WBC: 4.7 10*3/uL (ref 3.4–10.8)

## 2016-09-22 LAB — COMPREHENSIVE METABOLIC PANEL
A/G RATIO: 1.5 (ref 1.2–2.2)
ALBUMIN: 4 g/dL (ref 3.5–4.8)
ALT: 16 IU/L (ref 0–32)
AST: 20 IU/L (ref 0–40)
Alkaline Phosphatase: 80 IU/L (ref 39–117)
BILIRUBIN TOTAL: 0.6 mg/dL (ref 0.0–1.2)
BUN / CREAT RATIO: 18 (ref 12–28)
BUN: 19 mg/dL (ref 8–27)
CALCIUM: 8.9 mg/dL (ref 8.7–10.3)
CHLORIDE: 102 mmol/L (ref 96–106)
CO2: 21 mmol/L (ref 20–29)
Creatinine, Ser: 1.04 mg/dL — ABNORMAL HIGH (ref 0.57–1.00)
GFR, EST AFRICAN AMERICAN: 62 mL/min/{1.73_m2} (ref >59)
GFR, EST NON AFRICAN AMERICAN: 54 mL/min/{1.73_m2} — AB (ref >59)
Globulin, Total: 2.7 g/dL (ref 1.5–4.5)
Glucose: 96 mg/dL (ref 65–99)
POTASSIUM: 4.2 mmol/L (ref 3.5–5.2)
Sodium: 141 mmol/L (ref 134–144)
TOTAL PROTEIN: 6.7 g/dL (ref 6.0–8.5)

## 2016-09-23 ENCOUNTER — Encounter: Payer: Self-pay | Admitting: Family

## 2016-09-23 ENCOUNTER — Ambulatory Visit: Payer: Medicare Other | Attending: Family | Admitting: Family

## 2016-09-23 VITALS — BP 115/65 | HR 74 | Resp 18 | Ht 60.0 in | Wt 200.2 lb

## 2016-09-23 DIAGNOSIS — I110 Hypertensive heart disease with heart failure: Secondary | ICD-10-CM | POA: Insufficient documentation

## 2016-09-23 DIAGNOSIS — E669 Obesity, unspecified: Secondary | ICD-10-CM | POA: Diagnosis not present

## 2016-09-23 DIAGNOSIS — E785 Hyperlipidemia, unspecified: Secondary | ICD-10-CM | POA: Diagnosis not present

## 2016-09-23 DIAGNOSIS — I5022 Chronic systolic (congestive) heart failure: Secondary | ICD-10-CM | POA: Insufficient documentation

## 2016-09-23 DIAGNOSIS — I4891 Unspecified atrial fibrillation: Secondary | ICD-10-CM

## 2016-09-23 DIAGNOSIS — I10 Essential (primary) hypertension: Secondary | ICD-10-CM

## 2016-09-23 DIAGNOSIS — G4733 Obstructive sleep apnea (adult) (pediatric): Secondary | ICD-10-CM | POA: Diagnosis not present

## 2016-09-23 DIAGNOSIS — I5020 Unspecified systolic (congestive) heart failure: Secondary | ICD-10-CM | POA: Diagnosis present

## 2016-09-23 DIAGNOSIS — I2510 Atherosclerotic heart disease of native coronary artery without angina pectoris: Secondary | ICD-10-CM | POA: Diagnosis not present

## 2016-09-23 DIAGNOSIS — E7800 Pure hypercholesterolemia, unspecified: Secondary | ICD-10-CM | POA: Diagnosis not present

## 2016-09-23 NOTE — Progress Notes (Signed)
Agree with pharmacist note below.  Physical exam and ROS done by myself.  Discussed increasing entresto at her next visit if able. Encouraged her to be as active as possible.  Return in 2 months or sooner for any questions or problems before then.  Clarisa Kindred, FNP  HF Clinic at [facility]    Subjective:    Patient ID: Erin [name], female    DOB: January 02, 1946, 71 y.o.   MRN: [id number]  HPI   Erin [name] is a 71 y/o female with a history of CAD, hyperlipidemia, HTN, atrial fibrillation, obstructive sleep apnea and chronic heart failure.  Most recent Echo done on 08/19/16 and shows an EF 25-30% along with moderate MR, mild/mod TR and moderately elevated PA pressure of 56 mm Hg. Cardiac catheterization done which showed significant 2 vessel CAD. 2 DES's placed.   Admitted 08/23/16 due to HF exacerbation and VT/VF. During hospital stay had PCI with 2 DES placed. Was started on ASA (x30 days) and Plavix. Discharged home 08/26/16 with reduced dose of Coreg due to soft BP during admission and triple therapy with ASA, Plavix and Eliquis.   Patient presents for a follow up visit today, although has not been seen here since 11/2014. Patient denies any shortness of breath, edema or recent chest pain. Patient endorses calling doctors office this past Monday due to swelling. Was instructed to take 2 extra Lasix pills Monday afternoon and Tuesday afternoon.   Past Medical History:  Diagnosis Date  . Anginal pain (HCC)   . Automatic implantable cardioverter-defibrillator in situ    a. s/p MDT ICD 07/2012; b. SN # [id number]   . CAD (coronary artery disease)    a. cath 2003: LM nl, mid LAD 50%, LCx nl, RCA nl b. L&RHC 08/17/2014 100% occluded mid LCx, 70% mid LAD with FFR 0.82. Medical therapy. CI 2.1. CO 4.01c. LHC 8/18: CTO LCx w/ R-L colatts, LAD s/p PCI/DES x 2  . Chronic systolic CHF (congestive heart failure) (HCC)    a. echo 2014: EF 25%, diffuse HK, LA moderately dilated, RA mildly dilated, PASP  38 mm Hg; b. echo 08/2014: EF 25-30%, diffuse HK, RWMA cannot be excluded, mild MR, mild biatrial enlargement, RV mildly dilated, wall thickness nl, pacer wire/catheter noted, mild to mod TR, PASP high nl, c. TTE 8/18: EF 25-30%, diffuse HK, GR1DD, mod MR, mod dilated LA, nl RV sys fxn, PASP 56  . Encounter for long-term (current) use of anticoagulants   . Exertional shortness of breath   . H/O medication noncompliance   . High cholesterol   . Hypertension   . NICM (nonischemic cardiomyopathy) (HCC)    a. s/p AICD 07/22/2012  . Obesity   . OSA on CPAP    "suppose to; not often" (07/22/2012)  . Other "heavy-for-dates" infants   . PAF (paroxysmal atrial fibrillation) (HCC)    a. on warfarin--> Eliquis 08/2014    Past Surgical History:  Procedure Laterality Date  . ABDOMINAL HYSTERECTOMY  ~ 1998   "laparoscopic" (07/22/2012)  . CARDIAC CATHETERIZATION  ? 1990's  . CARDIAC CATHETERIZATION N/A 08/17/2014   Procedure: Right/Left Heart Cath and Coronary Angiography;  Surgeon: Kathleene Hazel, MD;  Location: [facility] INVASIVE CV LAB;  Service: Cardiovascular;  Laterality: N/A;  . CARDIAC DEFIBRILLATOR PLACEMENT  07/22/2012   dual-chamber ICD.  Marland Kitchen CORONARY STENT INTERVENTION N/A 08/24/2016   Procedure: CORONARY STENT INTERVENTION;  Surgeon: Iran Ouch, MD;  Location: [facility] INVASIVE CV LAB;  Service: Cardiovascular;  Laterality: N/A;  .  IMPLANTABLE CARDIOVERTER DEFIBRILLATOR IMPLANT N/A 07/22/2012   Procedure: IMPLANTABLE CARDIOVERTER DEFIBRILLATOR IMPLANT;  Surgeon: Marinus Maw, MD;  Location: [facility] CATH LAB;  Service: Cardiovascular;  Laterality: N/A;  . LEFT HEART CATH AND CORONARY ANGIOGRAPHY N/A 08/24/2016   Procedure: LEFT HEART CATH AND CORONARY ANGIOGRAPHY;  Surgeon: Iran Ouch, MD;  Location: [facility] INVASIVE CV LAB;  Service: Cardiovascular;  Laterality: N/A;  . TEE WITHOUT CARDIOVERSION N/A 08/21/2014   Procedure: TRANSESOPHAGEAL ECHOCARDIOGRAM (TEE);  Surgeon: Chilton Si, MD;   Location: [facility] ENDOSCOPY;  Service: Cardiovascular;  Laterality: N/A;  . TUBAL LIGATION  ~ 1978    Family History  Problem Relation Age of Onset  . Heart disease Mother   . Heart disease Father   . Lung disease Sister   . Diabetes Sister   . Heart failure Brother   . Thyroid disease Neg Hx     Social History  Substance Use Topics  . Smoking status: Never Smoker  . Smokeless tobacco: Never Used  . Alcohol use No    No Known Allergies  Prior to Admission medications   Medication Sig Start Date End Date Taking? Authorizing Provider  amiodarone (PACERONE) 200 MG tablet Take 1 tablet (200 mg total) by mouth daily. 09/03/16  Yes Dunn, Raymon Mutton, PA-C  apixaban (ELIQUIS) 5 MG TABS tablet Take 1 tablet (5 mg total) by mouth 2 (two) times daily. 08/21/16  Yes Adrian Saran, MD  atorvastatin (LIPITOR) 40 MG tablet Take 1 tablet (40 mg total) by mouth daily at 6 PM. 09/21/16  Yes Duke Salvia, MD  carvedilol (COREG) 3.125 MG tablet Take 1 tablet (3.125 mg total) by mouth 2 (two) times daily with a meal. 09/21/16  Yes Duke Salvia, MD  clopidogrel (PLAVIX) 75 MG tablet Take 1 tablet (75 mg total) by mouth daily with breakfast. 09/21/16  Yes Duke Salvia, MD  furosemide (LASIX) 40 MG tablet Take 1 tablet (40 mg total) by mouth daily. 09/21/16  Yes Duke Salvia, MD  methimazole (TAPAZOLE) 10 MG tablet Take 1 tablet (10 mg total) by mouth 2 (two) times daily. 09/21/16  Yes Duke Salvia, MD  sacubitril-valsartan (ENTRESTO) 24-26 MG Take 1 tablet by mouth 2 (two) times daily. 08/21/16  Yes Adrian Saran, MD  spironolactone (ALDACTONE) 25 MG tablet Take 0.5 tablets (12.5 mg total) by mouth daily. 09/21/16  Yes Duke Salvia, MD    Review of Systems  Constitutional: Negative for appetite change and fatigue.  HENT: Negative for congestion, postnasal drip and sore throat.   Eyes: Negative.   Respiratory: Negative for cough, chest tightness and shortness of breath.   Cardiovascular: Negative for  chest pain, palpitations and leg swelling.  Gastrointestinal: Negative for abdominal distention and abdominal pain.  Endocrine: Negative.   Genitourinary: Negative.   Musculoskeletal: Negative for back pain and neck pain.  Skin: Negative.   Allergic/Immunologic: Negative.   Neurological: Negative for dizziness and light-headedness.  Hematological: Negative for adenopathy. Does not bruise/bleed easily.  Psychiatric/Behavioral: Negative for dysphoric mood and sleep disturbance (sleeping on 2 pillows with CPAP nightly). The patient is not nervous/anxious.    Vitals:   09/23/16 0847  BP: 115/65  Pulse: 74  Resp: 18  SpO2: 100%  Weight: 200 lb 4 oz (90.8 kg)  Height: 5' (1.524 m)   Wt Readings from Last 3 Encounters:  09/23/16 200 lb 4 oz (90.8 kg)  09/21/16 204 lb 12 oz (92.9 kg)  09/03/16 197 lb 8 oz (89.6 kg)  Lab Results  Component Value Date   CREATININE 1.04 (H) 09/21/2016   CREATININE 1.16 (H) 09/11/2016   CREATININE 1.52 (H) 09/03/2016       Objective:   Physical Exam  Constitutional: She is oriented to person, place, and time. She appears well-developed and well-nourished.  HENT:  Head: Normocephalic and atraumatic.  Neck: Normal range of motion. Neck supple.  Cardiovascular: Normal rate and regular rhythm.   Pulmonary/Chest: Effort normal. She has no wheezes. She has no rales.  Abdominal: Soft. She exhibits no distension. There is no tenderness.  Musculoskeletal: She exhibits no edema or tenderness.  Neurological: She is alert and oriented to person, place, and time.  Skin: Skin is warm and dry.  Psychiatric: She has a normal mood and affect. Her behavior is normal. Thought content normal.  Nursing note and vitals reviewed.     Assessment & Plan:  1. Chronic heart failure with reduced ejection fraction-  - NYHA class I - euvolemic today - She continues to weigh herself at home; Instructed to call for an overnight weight gain >2 pounds and/or > 5 pound weight  gain in a week - Not adding salt to food; Reviewed low sodium diet and to keep daily sodium intake 2000mg . - Called doctor's office this past Monday due to swelling; Instructed to take 2 extra Lasix pills Monday afternoon and Tuesday afternoon; Patient says edema is gone today.  - Coreg was decreased to 3.125mg  twice daily after recent hospital discharge (08/26/16) due to soft blood pressures during hospital stay  - Was instructed to take ASA for 30 days after 2 DES placed; Patient says Dr. Graciela Husbands told her to stop taking this past Monday (09/21/16); Continues on Plavix and Eliquis.  - Finished PT; will continue exercises done with PT and will start walking daily.  - Continue all medications; Will consider increasing Entresto dose at next visit based on blood pressure   2: HTN-  - Blood pressure is a little on the low side; currently without any symptoms but will not adjust medications at this time - Would like to titrate up Entresto and add bidil if possible  3: Atrial fibrillation-  - Currently rate controlled at this time.  - Remains on eliquis, amiodarone and carvedilol.  Reviewed all medications verbally with patient.   Return in 2 months or sooner for any questions/problems before then.   Cleopatra Cedar, PharmD  Pharmacy Resident  09/23/16

## 2016-09-23 NOTE — Patient Instructions (Signed)
Continue weighing daily and call for an overnight weight gain of > 2 pounds or a weekly weight gain of >5 pounds. 

## 2016-10-05 ENCOUNTER — Telehealth: Payer: Self-pay

## 2016-10-05 NOTE — Telephone Encounter (Signed)
Follow up ° ° ° ° °Patient returning call for results. ° ° ° ° °

## 2016-10-05 NOTE — Telephone Encounter (Signed)
I spoke with pt and reviewed lab results with her.  

## 2016-10-05 NOTE — Telephone Encounter (Signed)
lmtcb for lab results

## 2016-10-13 ENCOUNTER — Encounter: Payer: Self-pay | Admitting: Endocrinology

## 2016-10-13 ENCOUNTER — Ambulatory Visit (INDEPENDENT_AMBULATORY_CARE_PROVIDER_SITE_OTHER): Payer: Medicare Other | Admitting: Endocrinology

## 2016-10-13 VITALS — BP 132/82 | HR 77 | Wt 204.0 lb

## 2016-10-13 DIAGNOSIS — E0580 Other thyrotoxicosis without thyrotoxic crisis or storm: Secondary | ICD-10-CM

## 2016-10-13 LAB — TSH: TSH: 1.22 u[IU]/mL (ref 0.35–4.50)

## 2016-10-13 LAB — T4, FREE: FREE T4: 1.03 ng/dL (ref 0.60–1.60)

## 2016-10-13 NOTE — Progress Notes (Signed)
Subjective:    Patient ID: Erin [name], female    DOB: 03-Jan-1946, 71 y.o.   MRN: [id number]  HPI Pt returns for f/u of hyperthyroidism (dx'ed whlie on amiodarone--she has taken since 2014; in 2015, she had slightly elevated TSH; several measurements more recently were low; she has never had thyroid imaging; in Sept of 2016, she was rx'ed tapazole).  She was last seen here in 2016.  Tapazole was resumed when she was in the hospital.  pt states she feels well in general.   Past Medical History:  Diagnosis Date  . Anginal pain (HCC)   . Automatic implantable cardioverter-defibrillator in situ    a. s/p MDT ICD 07/2012; b. SN # [id number]   . CAD (coronary artery disease)    a. cath 2003: LM nl, mid LAD 50%, LCx nl, RCA nl b. L&RHC 08/17/2014 100% occluded mid LCx, 70% mid LAD with FFR 0.82. Medical therapy. CI 2.1. CO 4.01c. LHC 8/18: CTO LCx w/ R-L colatts, LAD s/p PCI/DES x 2  . Chronic systolic CHF (congestive heart failure) (HCC)    a. echo 2014: EF 25%, diffuse HK, LA moderately dilated, RA mildly dilated, PASP 38 mm Hg; b. echo 08/2014: EF 25-30%, diffuse HK, RWMA cannot be excluded, mild MR, mild biatrial enlargement, RV mildly dilated, wall thickness nl, pacer wire/catheter noted, mild to mod TR, PASP high nl, c. TTE 8/18: EF 25-30%, diffuse HK, GR1DD, mod MR, mod dilated LA, nl RV sys fxn, PASP 56  . Encounter for long-term (current) use of anticoagulants   . Exertional shortness of breath   . H/O medication noncompliance   . High cholesterol   . Hypertension   . NICM (nonischemic cardiomyopathy) (HCC)    a. s/p AICD 07/22/2012  . Obesity   . OSA on CPAP    "suppose to; not often" (07/22/2012)  . Other "heavy-for-dates" infants   . PAF (paroxysmal atrial fibrillation) (HCC)    a. on warfarin--> Eliquis 08/2014    Past Surgical History:  Procedure Laterality Date  . ABDOMINAL HYSTERECTOMY  ~ 1998   "laparoscopic" (07/22/2012)  . CARDIAC CATHETERIZATION  ? 1990's  . CARDIAC  CATHETERIZATION N/A 08/17/2014   Procedure: Right/Left Heart Cath and Coronary Angiography;  Surgeon: Kathleene Hazel, MD;  Location: [facility] INVASIVE CV LAB;  Service: Cardiovascular;  Laterality: N/A;  . CARDIAC DEFIBRILLATOR PLACEMENT  07/22/2012   dual-chamber ICD.  Marland Kitchen CORONARY STENT INTERVENTION N/A 08/24/2016   Procedure: CORONARY STENT INTERVENTION;  Surgeon: Iran Ouch, MD;  Location: [facility] INVASIVE CV LAB;  Service: Cardiovascular;  Laterality: N/A;  . IMPLANTABLE CARDIOVERTER DEFIBRILLATOR IMPLANT N/A 07/22/2012   Procedure: IMPLANTABLE CARDIOVERTER DEFIBRILLATOR IMPLANT;  Surgeon: Marinus Maw, MD;  Location: [facility] CATH LAB;  Service: Cardiovascular;  Laterality: N/A;  . LEFT HEART CATH AND CORONARY ANGIOGRAPHY N/A 08/24/2016   Procedure: LEFT HEART CATH AND CORONARY ANGIOGRAPHY;  Surgeon: Iran Ouch, MD;  Location: [facility] INVASIVE CV LAB;  Service: Cardiovascular;  Laterality: N/A;  . TEE WITHOUT CARDIOVERSION N/A 08/21/2014   Procedure: TRANSESOPHAGEAL ECHOCARDIOGRAM (TEE);  Surgeon: Chilton Si, MD;  Location: [facility] ENDOSCOPY;  Service: Cardiovascular;  Laterality: N/A;  . TUBAL LIGATION  ~ 1978    Social History   Social History  . Marital status: Single    Spouse name: N/A  . Number of children: 3  . Years of education: N/A   Occupational History  . Retired [employer]   Social History Main Topics  . Smoking status:  Never Smoker  . Smokeless tobacco: Never Used  . Alcohol use No  . Drug use: No  . Sexual activity: No   Other Topics Concern  . Not on file   Social History Narrative  . No narrative on file    Current Outpatient Prescriptions on File Prior to Visit  Medication Sig Dispense Refill  . amiodarone (PACERONE) 200 MG tablet Take 1 tablet (200 mg total) by mouth daily. 60 tablet 6  . apixaban (ELIQUIS) 5 MG TABS tablet Take 1 tablet (5 mg total) by mouth 2 (two) times daily. 180 tablet 3  . atorvastatin (LIPITOR) 40 MG tablet Take 1 tablet  (40 mg total) by mouth daily at 6 PM. 30 tablet 6  . carvedilol (COREG) 3.125 MG tablet Take 1 tablet (3.125 mg total) by mouth 2 (two) times daily with a meal. (Patient taking differently: Take 3.125 mg by mouth 2 (two) times daily with a meal. ) 60 tablet 6  . clopidogrel (PLAVIX) 75 MG tablet Take 1 tablet (75 mg total) by mouth daily with breakfast. 30 tablet 6  . furosemide (LASIX) 40 MG tablet Take 1 tablet (40 mg total) by mouth daily. 30 tablet 6  . methimazole (TAPAZOLE) 10 MG tablet Take 1 tablet (10 mg total) by mouth 2 (two) times daily. 60 tablet 3  . sacubitril-valsartan (ENTRESTO) 24-26 MG Take 1 tablet by mouth 2 (two) times daily. 60 tablet 1  . spironolactone (ALDACTONE) 25 MG tablet Take 0.5 tablets (12.5 mg total) by mouth daily. 30 tablet 3   No current facility-administered medications on file prior to visit.     No Known Allergies  Family History  Problem Relation Age of Onset  . Heart disease Mother   . Heart disease Father   . Lung disease Sister   . Diabetes Sister   . Heart failure Brother   . Thyroid disease Neg Hx     BP 132/82   Pulse 77   Wt 204 lb (92.5 kg)   SpO2 97%   BMI 39.84 kg/m   Review of Systems She denies fever.      Objective:   Physical Exam VITAL SIGNS:  See vs page GENERAL: no distress NECK: There is no palpable thyroid enlargement.  No thyroid nodule is palpable.  No palpable lymphadenopathy at the anterior neck.      Assessment & Plan:  Hyperthyroidism: due for recheck.    Patient Instructions  blood tests are requested for you today.  We'll let you know about the results. If ever you have fever while taking methimazole, stop it and call us, even if the reason is obvious, because of the risk of a rare side-effect.   Please come back for a follow-up appointment in 2 months.

## 2016-10-13 NOTE — Patient Instructions (Addendum)
blood tests are requested for you today.  We'll let you know about the results.   If ever you have fever while taking methimazole, stop it and call us, even if the reason is obvious, because of the risk of a rare side-effect.  Please come back for a follow-up appointment in 2 months.   

## 2016-10-21 ENCOUNTER — Telehealth: Payer: Self-pay | Admitting: Cardiovascular Disease

## 2016-10-21 NOTE — Telephone Encounter (Signed)
Patient states that her feet feel a little tight and her weight has changed. She reports that they drove up to [place] and when she got back she noticed the tightness in her feet and increased weight. She reports weight from 198 to 203. Reviewed with her that at last office visit she was 204. Instructed her to wear compression hose, elevate feet, and avoid any salt over the weekend. Reviewed with her that in the future with long car rides she should try wearing the compression socks to help reduce that swelling to lower extremities. She was very appreciative for the call, agreeable with plan, and had no further questions at this time.

## 2016-10-21 NOTE — Telephone Encounter (Signed)
New Message     Would like nurse to call her back   Pt c/o swelling: STAT is pt has developed SOB within 24 hours  1) How much weight have you gained and in what time span?  A couple days 3 lbs   2) If swelling, where is the swelling located?  Feet and legs   3) Are you currently taking a fluid pill? yes  4) Are you currently SOB? no  5) Do you have a log of your daily weights (if so, list)? no  6) Have you gained 3 pounds in a day or 5 pounds in a week? Yes   7) Have you traveled recently?  Yes- was out of town for 4 days to [place]

## 2016-10-23 NOTE — Telephone Encounter (Signed)
Would consider removing the CHF clinic appointment mid November up a few weeks if she continues to have problems

## 2016-10-26 ENCOUNTER — Telehealth: Payer: Self-pay | Admitting: Internal Medicine

## 2016-10-26 MED ORDER — SACUBITRIL-VALSARTAN 24-26 MG PO TABS: 1.0000 | ORAL_TABLET | Freq: Two times a day (BID) | ORAL | 3 refills | Status: DC

## 2016-10-26 NOTE — Telephone Encounter (Signed)
°*  STAT* If patient is at the pharmacy, call can be transferred to refill team.   1. Which medications need to be refilled? (please list name of each medication and dose if known) entresto   2. Which pharmacy/location (including street and city if local pharmacy) is medication to be sent to? [facility] [address]   3. Do they need a 30 day or 90 day supply? 30 day

## 2016-10-26 NOTE — Telephone Encounter (Signed)
Dr. Mariah Milling would like patient to move up her appointment with the CHF clinic given her reports. Will route message to them to assist with scheduling.

## 2016-10-29 ENCOUNTER — Encounter: Payer: Self-pay | Admitting: Family

## 2016-10-29 ENCOUNTER — Ambulatory Visit: Payer: Medicare Other | Attending: Family | Admitting: Family

## 2016-10-29 VITALS — BP 136/79 | HR 83 | Resp 18 | Ht 60.0 in | Wt 201.5 lb

## 2016-10-29 DIAGNOSIS — I10 Essential (primary) hypertension: Secondary | ICD-10-CM

## 2016-10-29 DIAGNOSIS — E669 Obesity, unspecified: Secondary | ICD-10-CM | POA: Insufficient documentation

## 2016-10-29 DIAGNOSIS — E7800 Pure hypercholesterolemia, unspecified: Secondary | ICD-10-CM | POA: Insufficient documentation

## 2016-10-29 DIAGNOSIS — I480 Paroxysmal atrial fibrillation: Secondary | ICD-10-CM | POA: Diagnosis not present

## 2016-10-29 DIAGNOSIS — I110 Hypertensive heart disease with heart failure: Secondary | ICD-10-CM | POA: Insufficient documentation

## 2016-10-29 DIAGNOSIS — I5022 Chronic systolic (congestive) heart failure: Secondary | ICD-10-CM | POA: Diagnosis present

## 2016-10-29 DIAGNOSIS — Z79899 Other long term (current) drug therapy: Secondary | ICD-10-CM | POA: Diagnosis not present

## 2016-10-29 DIAGNOSIS — Z7902 Long term (current) use of antithrombotics/antiplatelets: Secondary | ICD-10-CM | POA: Insufficient documentation

## 2016-10-29 DIAGNOSIS — I2510 Atherosclerotic heart disease of native coronary artery without angina pectoris: Secondary | ICD-10-CM | POA: Insufficient documentation

## 2016-10-29 DIAGNOSIS — G4733 Obstructive sleep apnea (adult) (pediatric): Secondary | ICD-10-CM | POA: Diagnosis not present

## 2016-10-29 DIAGNOSIS — Z7901 Long term (current) use of anticoagulants: Secondary | ICD-10-CM | POA: Diagnosis not present

## 2016-10-29 MED ORDER — SACUBITRIL-VALSARTAN 49-51 MG PO TABS: 1.0000 | ORAL_TABLET | Freq: Two times a day (BID) | ORAL | 5 refills | Status: DC

## 2016-10-29 NOTE — Patient Instructions (Addendum)
Continue weighing daily and call for an overnight weight gain of > 2 pounds or a weekly weight gain of >5 pounds.  Finish your current bottle of entresto dose (24/26mg  dose). When you finish your current bottle, you will then begin the next dose (49/51mg ) dose twice daily.   When you have a week's worth left of your current entresto, call your pharmacy to have them fill the higher dose.

## 2016-10-29 NOTE — Progress Notes (Signed)
Subjective:    Patient ID: Erin [name], female    DOB: Feb 09, 1945, 71 y.o.   MRN: [id number]  HPI   Erin [name] is a 71 y/o female with a history of CAD, hyperlipidemia, HTN, atrial fibrillation, obstructive sleep apnea and chronic heart failure.  Most recent Echo done on 08/19/16 and shows an EF 25-30% along with moderate MR, mild/mod TR and moderately elevated PA pressure of 56 mm Hg. Cardiac catheterization done which showed significant 2 vessel CAD. 2 DES's placed.   Admitted 08/19/16 due to HF exacerbation and VT/VF. During hospital stay had PCI with 2 DES placed. Was started on ASA (x30 days) and Plavix. Discharged home 08/26/16 with reduced dose of Coreg due to soft BP during admission and triple therapy with ASA, Plavix and Eliquis.   Patient presents for a follow up visit with a chief complaint of mild shortness of breath upon moderate exertion. She describes this as chronic in nature having been present for several years with varying levels of severity. She has associated fatigue along with this. She denies any chest pain, edema, palpitations, dizziness or weight gain.   Past Medical History:  Diagnosis Date  . Anginal pain (HCC)   . Automatic implantable cardioverter-defibrillator in situ    a. s/p MDT ICD 07/2012; b. SN # [id number]   . CAD (coronary artery disease)    a. cath 2003: LM nl, mid LAD 50%, LCx nl, RCA nl b. L&RHC 08/17/2014 100% occluded mid LCx, 70% mid LAD with FFR 0.82. Medical therapy. CI 2.1. CO 4.01c. LHC 8/18: CTO LCx w/ R-L colatts, LAD s/p PCI/DES x 2  . Chronic systolic CHF (congestive heart failure) (HCC)    a. echo 2014: EF 25%, diffuse HK, LA moderately dilated, RA mildly dilated, PASP 38 mm Hg; b. echo 08/2014: EF 25-30%, diffuse HK, RWMA cannot be excluded, mild MR, mild biatrial enlargement, RV mildly dilated, wall thickness nl, pacer wire/catheter noted, mild to mod TR, PASP high nl, c. TTE 8/18: EF 25-30%, diffuse HK, GR1DD, mod MR, mod dilated LA, nl  RV sys fxn, PASP 56  . Encounter for long-term (current) use of anticoagulants   . Exertional shortness of breath   . H/O medication noncompliance   . High cholesterol   . Hypertension   . NICM (nonischemic cardiomyopathy) (HCC)    a. s/p AICD 07/22/2012  . Obesity   . OSA on CPAP    "suppose to; not often" (07/22/2012)  . Other "heavy-for-dates" infants   . PAF (paroxysmal atrial fibrillation) (HCC)    a. on warfarin--> Eliquis 08/2014    Past Surgical History:  Procedure Laterality Date  . ABDOMINAL HYSTERECTOMY  ~ 1998   "laparoscopic" (07/22/2012)  . CARDIAC CATHETERIZATION  ? 1990's  . CARDIAC CATHETERIZATION N/A 08/17/2014   Procedure: Right/Left Heart Cath and Coronary Angiography;  Surgeon: Kathleene Hazel, MD;  Location: [facility] INVASIVE CV LAB;  Service: Cardiovascular;  Laterality: N/A;  . CARDIAC DEFIBRILLATOR PLACEMENT  07/22/2012   dual-chamber ICD.  Marland Kitchen CORONARY STENT INTERVENTION N/A 08/24/2016   Procedure: CORONARY STENT INTERVENTION;  Surgeon: Iran Ouch, MD;  Location: [facility] INVASIVE CV LAB;  Service: Cardiovascular;  Laterality: N/A;  . IMPLANTABLE CARDIOVERTER DEFIBRILLATOR IMPLANT N/A 07/22/2012   Procedure: IMPLANTABLE CARDIOVERTER DEFIBRILLATOR IMPLANT;  Surgeon: Marinus Maw, MD;  Location: [facility] CATH LAB;  Service: Cardiovascular;  Laterality: N/A;  . LEFT HEART CATH AND CORONARY ANGIOGRAPHY N/A 08/24/2016   Procedure: LEFT HEART CATH AND CORONARY ANGIOGRAPHY;  Surgeon:  Iran Ouch, MD;  Location: [facility] INVASIVE CV LAB;  Service: Cardiovascular;  Laterality: N/A;  . TEE WITHOUT CARDIOVERSION N/A 08/21/2014   Procedure: TRANSESOPHAGEAL ECHOCARDIOGRAM (TEE);  Surgeon: Chilton Si, MD;  Location: [facility] ENDOSCOPY;  Service: Cardiovascular;  Laterality: N/A;  . TUBAL LIGATION  ~ 1978    Family History  Problem Relation Age of Onset  . Heart disease Mother   . Heart disease Father   . Lung disease Sister   . Diabetes Sister   . Heart failure Brother    . Thyroid disease Neg Hx     Social History  Substance Use Topics  . Smoking status: Never Smoker  . Smokeless tobacco: Never Used  . Alcohol use No    No Known Allergies  Prior to Admission medications   Medication Sig Start Date End Date Taking? Authorizing Provider  amiodarone (PACERONE) 200 MG tablet Take 1 tablet (200 mg total) by mouth daily. 09/03/16  Yes Dunn, Raymon Mutton, PA-C  apixaban (ELIQUIS) 5 MG TABS tablet Take 1 tablet (5 mg total) by mouth 2 (two) times daily. 08/21/16  Yes Adrian Saran, MD  atorvastatin (LIPITOR) 40 MG tablet Take 1 tablet (40 mg total) by mouth daily at 6 PM. 09/21/16  Yes Duke Salvia, MD  carvedilol (COREG) 3.125 MG tablet Take 1 tablet (3.125 mg total) by mouth 2 (two) times daily with a meal. 09/21/16  Yes Duke Salvia, MD  clopidogrel (PLAVIX) 75 MG tablet Take 1 tablet (75 mg total) by mouth daily with breakfast. 09/21/16  Yes Duke Salvia, MD  furosemide (LASIX) 40 MG tablet Take 1 tablet (40 mg total) by mouth daily. 09/21/16  Yes Duke Salvia, MD  methimazole (TAPAZOLE) 10 MG tablet Take 1 tablet (10 mg total) by mouth 2 (two) times daily. 09/21/16  Yes Duke Salvia, MD  sacubitril-valsartan (ENTRESTO) 24-26 MG Take 1 tablet by mouth 2 (two) times daily. 10/26/16  Yes Antonieta Iba, MD  spironolactone (ALDACTONE) 25 MG tablet Take 0.5 tablets (12.5 mg total) by mouth daily. 09/21/16  Yes Duke Salvia, MD    Review of Systems  Constitutional: Positive for fatigue. Negative for appetite change.  HENT: Negative for congestion, postnasal drip and sore throat.   Eyes: Negative.   Respiratory: Positive for shortness of breath. Negative for cough and chest tightness.   Cardiovascular: Negative for chest pain, palpitations and leg swelling.  Gastrointestinal: Negative for abdominal distention and abdominal pain.  Endocrine: Negative.   Genitourinary: Negative.   Musculoskeletal: Negative for back pain and neck pain.  Skin: Negative.    Allergic/Immunologic: Negative.   Neurological: Negative for dizziness and light-headedness.  Hematological: Negative for adenopathy. Does not bruise/bleed easily.  Psychiatric/Behavioral: Negative for dysphoric mood and sleep disturbance (sleeping on 2 pillows with CPAP nightly). The patient is not nervous/anxious.    Vitals:   10/29/16 0842  BP: 136/79  Pulse: 83  Resp: 18  SpO2: 97%  Weight: 201 lb 8 oz (91.4 kg)  Height: 5' (1.524 m)   Wt Readings from Last 3 Encounters:  10/29/16 201 lb 8 oz (91.4 kg)  10/13/16 204 lb (92.5 kg)  09/23/16 200 lb 4 oz (90.8 kg)    Lab Results  Component Value Date   CREATININE 1.04 (H) 09/21/2016   CREATININE 1.16 (H) 09/11/2016   CREATININE 1.52 (H) 09/03/2016      Objective:   Physical Exam  Constitutional: She is oriented to person, place, and time. She appears well-developed  and well-nourished.  HENT:  Head: Normocephalic and atraumatic.  Neck: Normal range of motion. Neck supple.  Cardiovascular: Normal rate and regular rhythm.   Pulmonary/Chest: Effort normal. She has no wheezes. She has no rales.  Abdominal: Soft. She exhibits no distension. There is no tenderness.  Musculoskeletal: She exhibits no edema or tenderness.  Neurological: She is alert and oriented to person, place, and time.  Skin: Skin is warm and dry.  Psychiatric: She has a normal mood and affect. Her behavior is normal. Thought content normal.  Nursing note and vitals reviewed.     Assessment & Plan:  1. Chronic heart failure with reduced ejection fraction-  - NYHA class II - euvolemic today - She continues to weigh herself at home; Instructed to call for an overnight weight gain >2 pounds and/or > 5 pound weight gain in a week - Not adding salt to food; Reviewed low sodium diet and to keep daily sodium intake 2000mg . - discussed increasing her entresto and patient is agreeable to this. She will finish out her current bottle as she just had it filled and  then begin the 49/51mg  dose twice daily - will plan on checking a BMP at her next visit - PharmD reviewed medications with the patient  2: HTN-  - Blood pressure looks good today - reviewed BMP from 09/11/16 which showed a sodium 134, potassium 4.7 and GFR 54 - sees PCP Everardo All([facility]) 12/14/16  3: Atrial fibrillation-  - Currently rate controlled at this time.  - Remains on eliquis, plavix, amiodarone and carvedilol. - sees cardiologist Mariah Milling([place]) 12/04/16  Patient did not bring her medications nor a list. Each medication was verbally reviewed with the patient and she was encouraged to bring the bottles to every visit to confirm accuracy of list.  Return in 3 months or sooner for any questions/problems before then.

## 2016-11-02 ENCOUNTER — Other Ambulatory Visit: Payer: Medicare Other

## 2016-11-03 ENCOUNTER — Encounter: Payer: Medicare Other | Admitting: Internal Medicine

## 2016-11-06 ENCOUNTER — Ambulatory Visit (INDEPENDENT_AMBULATORY_CARE_PROVIDER_SITE_OTHER): Payer: Medicare Other

## 2016-11-06 ENCOUNTER — Other Ambulatory Visit: Payer: Self-pay

## 2016-11-06 DIAGNOSIS — I480 Paroxysmal atrial fibrillation: Secondary | ICD-10-CM | POA: Diagnosis not present

## 2016-11-13 ENCOUNTER — Telehealth: Payer: Self-pay | Admitting: Cardiovascular Disease

## 2016-11-13 NOTE — Telephone Encounter (Signed)
°*  STAT* If patient is at the pharmacy, call can be transferred to refill team.   1. Which medications need to be refilled? (please list name of each medication and dose if known)  Amlodipine   2. Which pharmacy/location (including street and city if local pharmacy) is medication to be sent to? [facility] [address]   3. Do they need a 30 day or 90 day supply? 90 day

## 2016-11-16 NOTE — Telephone Encounter (Signed)
Please review medication for refill. Thanks!  

## 2016-11-16 NOTE — Telephone Encounter (Signed)
Pt requested amiodarone refill stating [facility] told her she had no refills. Amiodarone 200mg  qd, #30, 6 refills sent to pharmacy September 03, 2016 Jazmine at [facility] confirmed pt has refills. Pt notified.

## 2016-11-17 ENCOUNTER — Other Ambulatory Visit (INDEPENDENT_AMBULATORY_CARE_PROVIDER_SITE_OTHER): Payer: Medicare Other

## 2016-11-17 DIAGNOSIS — E0580 Other thyrotoxicosis without thyrotoxic crisis or storm: Secondary | ICD-10-CM

## 2016-11-17 LAB — T4, FREE: Free T4: 0.89 ng/dL (ref 0.60–1.60)

## 2016-11-17 LAB — TSH: TSH: 2.13 u[IU]/mL (ref 0.35–4.50)

## 2016-11-18 ENCOUNTER — Ambulatory Visit: Payer: Medicare Other | Admitting: Family

## 2016-12-03 NOTE — Progress Notes (Signed)
Cardiology Office Note  Date:  12/04/2016   ID:  Erin [name], DOB 05/25/1945, MRN [id number]  PCP:  Patient, No Pcp Per   Chief Complaint  Patient presents with  . other    3 month follow up. Meds reviewed by the pt. verbally. "doing well."    HPI:  71 year old woman with known  coronary artery disease,  paroxysmal atrial fibrillation,  chronic systolic CHF   Medtronic ICD placed July 2014 lost to follow-up for the past 2 years, Recently ran out of her medications who [facility]  after episode of weakness, ICD shock while she was at the supermarket She presents for follow-up of her cardiomyopathy, sustained VT   At home her Weight 196 to 198 Slow trend up 2 pounds Taking Lasix 40 mg daily, trying to watch her fluids and salt Seen last week by CHF clinic, weight off 5 pounds  Scheduled to see Dr. Everardo AllEllison next week, then Dr. Berton MountSteve Klein early December Periodically has ankle swelling, no significant abdominal bloating, no PND orthopnea  EKG personally reviewed by myself on todays visit Shows normal sinus rhythm rate 80 bpm consider old anterior MI  Recent hospital records reviewed personally by myself  Medtronic ICD device interrogated showing appropriate shock for VT In addition has numerous episodes of VT with rate 167 up to 200 bpm, no therapy given Device reprogrammed 08/20/2016  Started on amiodarone infusion,  Echocardiogram discussed with him showing depressed ejection fraction 25% with dilated LV, global hypokinesis  Stress test results discussed with her in detail, anterior wall ischemia  cardiac catheterization showed significant two-vessel coronary artery disease with known chronically occluded left circumflex with right-to-left collaterals. There was progression of mid LAD disease.  -- treated successfully with PCI and 2 overlapped drug-eluting stent placement.  First diagonal was jailed by the stent with sluggish flow but no significant  ostial stenosis.    PMH:   has a past medical history of Anginal pain (HCC), Automatic implantable cardioverter-defibrillator in situ, CAD (coronary artery disease), Chronic systolic CHF (congestive heart failure) (HCC), Encounter for long-term (current) use of anticoagulants, Exertional shortness of breath, H/O medication noncompliance, High cholesterol, Hypertension, NICM (nonischemic cardiomyopathy) (HCC), Obesity, OSA on CPAP, Other "heavy-for-dates" infants, and PAF (paroxysmal atrial fibrillation) (HCC).  PSH:    Past Surgical History:  Procedure Laterality Date  . ABDOMINAL HYSTERECTOMY  ~ 1998   "laparoscopic" (07/22/2012)  . CARDIAC CATHETERIZATION  ? 1990's  . CARDIAC CATHETERIZATION N/A 08/17/2014   Procedure: Right/Left Heart Cath and Coronary Angiography;  Surgeon: Kathleene Hazelhristopher D McAlhany, MD;  [facility];  Service: Cardiovascular;  Laterality: N/A;  . CARDIAC DEFIBRILLATOR PLACEMENT  07/22/2012   dual-chamber ICD.  Marland Kitchen. CORONARY STENT INTERVENTION N/A 08/24/2016   Procedure: CORONARY STENT INTERVENTION;  Surgeon: Iran OuchArida, Muhammad A, MD;  Location: [facility] INVASIVE CV LAB;  Service: Cardiovascular;  Laterality: N/A;  . IMPLANTABLE CARDIOVERTER DEFIBRILLATOR IMPLANT N/A 07/22/2012   Procedure: IMPLANTABLE CARDIOVERTER DEFIBRILLATOR IMPLANT;  Surgeon: Marinus MawGregg W Taylor, MD;  [facility];  Service: Cardiovascular;  Laterality: N/A;  . LEFT HEART CATH AND CORONARY ANGIOGRAPHY N/A 08/24/2016   Procedure: LEFT HEART CATH AND CORONARY ANGIOGRAPHY;  Surgeon: Iran OuchArida, Muhammad A, MD;  Location: [facility] INVASIVE CV LAB;  Service: Cardiovascular;  Laterality: N/A;  . TEE WITHOUT CARDIOVERSION N/A 08/21/2014   Procedure: TRANSESOPHAGEAL ECHOCARDIOGRAM (TEE);  Surgeon: Chilton Siiffany [place], MD;  [facility];  Service: Cardiovascular;  Laterality: N/A;  . TUBAL LIGATION  ~ 1978  Current Outpatient Medications  Medication Sig Dispense Refill  . amiodarone (PACERONE) 200 MG  tablet Take 1 tablet (200 mg total) by mouth daily. 60 tablet 6  . apixaban (ELIQUIS) 5 MG TABS tablet Take 1 tablet (5 mg total) by mouth 2 (two) times daily. 180 tablet 3  . atorvastatin (LIPITOR) 40 MG tablet Take 1 tablet (40 mg total) by mouth daily at 6 PM. 30 tablet 6  . carvedilol (COREG) 3.125 MG tablet Take 1 tablet (3.125 mg total) by mouth 2 (two) times daily with a meal. 60 tablet 6  . clopidogrel (PLAVIX) 75 MG tablet Take 1 tablet (75 mg total) by mouth daily with breakfast. 30 tablet 6  . furosemide (LASIX) 40 MG tablet Take 1 tablet (40 mg total) by mouth daily. 30 tablet 6  . methimazole (TAPAZOLE) 10 MG tablet Take 1 tablet (10 mg total) by mouth 2 (two) times daily. 60 tablet 3  . sacubitril-valsartan (ENTRESTO) 49-51 MG Take 1 tablet by mouth 2 (two) times daily. 60 tablet 5  . spironolactone (ALDACTONE) 25 MG tablet Take 0.5 tablets (12.5 mg total) by mouth daily. 30 tablet 3   No current facility-administered medications for this visit.      Allergies:   Patient has no known allergies.   Social History:  The patient  reports that  has never smoked. she has never used smokeless tobacco. She reports that she does not drink alcohol or use drugs.   Family History:   family history includes Diabetes in her sister; Heart disease in her father and mother; Heart failure in her brother; Lung disease in her sister.    Review of Systems: Review of Systems  Constitutional: Negative.   Respiratory: Negative.   Cardiovascular: Negative.   Gastrointestinal: Negative.   Musculoskeletal: Negative.   Neurological: Negative.   Psychiatric/Behavioral: Negative.   All other systems reviewed and are negative.    PHYSICAL EXAM: VS:  BP 110/60 (BP Location: Left Arm, Patient Position: Sitting, Cuff Size: Normal)   Pulse 80   Ht 5' (1.524 m)   Wt 206 lb (93.4 kg)   BMI 40.23 kg/m  , BMI Body mass index is 40.23 kg/m. GEN: Well nourished, well developed, in no acute distress,  obese HEENT: normal  Neck: no JVD, carotid bruits, or masses Cardiac: RRR; no murmurs, rubs, or gallops,no edema  Respiratory:  clear to auscultation bilaterally, normal work of breathing GI: soft, nontender, nondistended, + BS MS: no deformity or atrophy  Skin: warm and dry, no rash Neuro:  Strength and sensation are intact Psych: euthymic mood, full affect    Recent Labs: 08/19/2016: B Natriuretic Peptide 892.0 08/24/2016: Magnesium 2.2 09/21/2016: ALT 16; BUN 19; Creatinine, Ser 1.04; Hemoglobin 11.8; Platelets 237; Potassium 4.2; Sodium 141 11/17/2016: TSH 2.13    Lipid Panel Lab Results  Component Value Date   CHOL 130 08/21/2014   HDL 25 (L) 08/21/2014   LDLCALC 91 08/21/2014   TRIG 72 08/21/2014      Wt Readings from Last 3 Encounters:  12/04/16 206 lb (93.4 kg)  10/29/16 201 lb 8 oz (91.4 kg)  10/13/16 204 lb (92.5 kg)       ASSESSMENT AND PLAN:  Atherosclerosis of native coronary artery of native heart with stable angina pectoris (HCC) Recent stent placement, on Plavix, no anginal symptoms Needs repeat lipid panel in follow-up  Congestive dilated cardiomyopathy (HCC) Continue Lasix 40 daily, continue other medications as detailed above Extra Lasix after lunch for weight gain such  as 199 up to 200 pounds at home She will ask Dr. Everardo All or Dr. Graciela Husbands for basic metabolic panel, if not we will do it in our office  Atrial fibrillation with RVR (HCC) - Plan: EKG 12-Lead Maintaining normal sinus rhythm, will continue anticoagulation  Essential hypertension Blood pressure is well controlled on today's visit. No changes made to the medications. Family concerned blood pressure running low but she denies any orthostasis symptoms  Ventricular tachycardia (HCC) Suspect exacerbated from acute on chronic systolic CHF, LV dilatation, global hypokinesis with ejection fraction 25% Had amiodarone load now 200 mg daily Appears relatively euvolemic ICD parameter changes  made in the hospital  Chronic systolic heart failure (HCC) Continue current medications as above Appears relatively euvolemic   Total encounter time more than 45 minutes  Greater than 50% was spent in counseling and coordination of care with the patient   Disposition:   F/U  6 months   Orders Placed This Encounter  Procedures  . EKG 12-Lead     Signed, Dossie Arbour, M.D., Ph.D. 12/04/2016  [facility] [place], [place] [phone]

## 2016-12-04 ENCOUNTER — Encounter: Payer: Self-pay | Admitting: Cardiovascular Disease

## 2016-12-04 ENCOUNTER — Ambulatory Visit (INDEPENDENT_AMBULATORY_CARE_PROVIDER_SITE_OTHER): Payer: Medicare Other | Admitting: Cardiovascular Disease

## 2016-12-04 VITALS — BP 110/60 | HR 80 | Ht 60.0 in | Wt 206.0 lb

## 2016-12-04 DIAGNOSIS — I472 Ventricular tachycardia, unspecified: Secondary | ICD-10-CM

## 2016-12-04 DIAGNOSIS — I10 Essential (primary) hypertension: Secondary | ICD-10-CM

## 2016-12-04 DIAGNOSIS — I4891 Unspecified atrial fibrillation: Secondary | ICD-10-CM

## 2016-12-04 DIAGNOSIS — I420 Dilated cardiomyopathy: Secondary | ICD-10-CM | POA: Diagnosis not present

## 2016-12-04 DIAGNOSIS — I5022 Chronic systolic (congestive) heart failure: Secondary | ICD-10-CM

## 2016-12-04 DIAGNOSIS — Z23 Encounter for immunization: Secondary | ICD-10-CM

## 2016-12-04 DIAGNOSIS — I25118 Atherosclerotic heart disease of native coronary artery with other forms of angina pectoris: Secondary | ICD-10-CM | POA: Diagnosis not present

## 2016-12-04 MED ORDER — APIXABAN 5 MG PO TABS: 5.0000 mg | ORAL_TABLET | Freq: Two times a day (BID) | ORAL | 3 refills | Status: DC

## 2016-12-04 NOTE — Patient Instructions (Addendum)
Medication Instructions:   Please take extra lasix after lunch for weight up to 199 or 200  Medication Samples have been provided to the patient.  Drug name: Lenore Manner       Strength: 49/51 mg        Qty: 2 boxes  LOT: [id number]  Exp.Date: Jan 2019  Medication Samples have been provided to the patient.  Drug name: Eliquis       Strength: 5 mg        Qty: 4 boxes  LOT: [id number]  Exp.Date: 3/21    Labwork:  See if [facility] can check BMP  Testing/Procedures:  No further testing at this time   Follow-Up: It was a pleasure seeing you in the office today. Please call us if you have new issues that need to be addressed before your next appt.  [phone]  Your physician wants you to follow-up in: 6 months.  You will receive a reminder letter in the mail two months in advance. If you don't receive a letter, please call our office to schedule the follow-up appointment.  If you need a refill on your cardiac medications before your next appointment, please call your pharmacy.

## 2016-12-14 ENCOUNTER — Ambulatory Visit: Payer: Medicare Other | Admitting: Endocrinology

## 2016-12-16 ENCOUNTER — Institutional Professional Consult (permissible substitution): Payer: Medicare Other | Admitting: Internal Medicine

## 2016-12-21 ENCOUNTER — Telehealth: Payer: Self-pay | Admitting: Cardiology

## 2016-12-21 ENCOUNTER — Encounter: Payer: Medicare Other | Admitting: *Deleted

## 2016-12-21 NOTE — Telephone Encounter (Signed)
LMOVM reminding pt to send remote transmission.   

## 2016-12-22 ENCOUNTER — Encounter: Payer: Self-pay | Admitting: Internal Medicine

## 2016-12-22 ENCOUNTER — Institutional Professional Consult (permissible substitution): Payer: Medicare Other | Admitting: Internal Medicine

## 2016-12-22 ENCOUNTER — Ambulatory Visit (INDEPENDENT_AMBULATORY_CARE_PROVIDER_SITE_OTHER): Payer: Medicare Other | Admitting: Internal Medicine

## 2016-12-22 VITALS — BP 124/60 | HR 76 | Ht 60.0 in | Wt 203.2 lb

## 2016-12-22 DIAGNOSIS — I5022 Chronic systolic (congestive) heart failure: Secondary | ICD-10-CM

## 2016-12-22 DIAGNOSIS — I480 Paroxysmal atrial fibrillation: Secondary | ICD-10-CM | POA: Diagnosis not present

## 2016-12-22 DIAGNOSIS — Z95810 Presence of automatic (implantable) cardiac defibrillator: Secondary | ICD-10-CM | POA: Diagnosis not present

## 2016-12-22 DIAGNOSIS — I428 Other cardiomyopathies: Secondary | ICD-10-CM | POA: Diagnosis not present

## 2016-12-22 LAB — CUP PACEART INCLINIC DEVICE CHECK
Battery Voltage: 2.99 V
Brady Statistic RA Percent Paced: 0.01 %
Brady Statistic RV Percent Paced: 0.03 %
Date Time Interrogation Session: 20181218154925
HighPow Impedance: 68 Ohm
Implantable Lead Implant Date: 20140718
Implantable Lead Location: 753860
Implantable Lead Model: 5076
Implantable Pulse Generator Implant Date: 20140718
Lead Channel Impedance Value: 323 Ohm
Lead Channel Impedance Value: 380 Ohm
Lead Channel Pacing Threshold Amplitude: 0.75 V
Lead Channel Pacing Threshold Pulse Width: 0.4 ms
Lead Channel Setting Pacing Amplitude: 2.5 V
Lead Channel Setting Sensing Sensitivity: 0.3 mV
MDC IDC LEAD IMPLANT DT: 20140718
MDC IDC LEAD LOCATION: 753859
MDC IDC MSMT BATTERY REMAINING LONGEVITY: 77 mo
MDC IDC MSMT LEADCHNL RA IMPEDANCE VALUE: 494 Ohm
MDC IDC MSMT LEADCHNL RA SENSING INTR AMPL: 4.125 mV
MDC IDC MSMT LEADCHNL RV PACING THRESHOLD AMPLITUDE: 0.5 V
MDC IDC MSMT LEADCHNL RV PACING THRESHOLD PULSEWIDTH: 0.4 ms
MDC IDC MSMT LEADCHNL RV SENSING INTR AMPL: 16 mV
MDC IDC SET LEADCHNL RA PACING AMPLITUDE: 2 V
MDC IDC SET LEADCHNL RV PACING PULSEWIDTH: 0.4 ms
MDC IDC STAT BRADY AP VP PERCENT: 0 %
MDC IDC STAT BRADY AP VS PERCENT: 0.01 %
MDC IDC STAT BRADY AS VP PERCENT: 0.03 %
MDC IDC STAT BRADY AS VS PERCENT: 99.95 %

## 2016-12-22 NOTE — Patient Instructions (Addendum)
Medication Instructions: - Your physician recommends that you continue on your current medications as directed. Please refer to the Current Medication list given to you today.  Labwork: - none ordered  Procedures/Testing: - none ordered  Follow-Up: - Remote monitoring is used to monitor your Pacemaker of ICD from home. This monitoring reduces the number of office visits required to check your device to one time per year. It allows [place] to keep an eye on the functioning of your device to ensure it is working properly. You are scheduled for a device check from home on 01/07/17- fluid check only &  03/23/17. You may send your transmission at any time that day. If you have a wireless device, the transmission will be sent automatically. After your physician reviews your transmission, you will receive a postcard with your next transmission date.  - Your physician wants you to follow-up in: 1 year with Dr. Graciela Husbands. You will receive a reminder letter in the mail two months in advance. If you don't receive a letter, please call our office to schedule the follow-up appointment.    Any Additional Special Instructions Will Be Listed Below (If Applicable).     If you need a refill on your cardiac medications before your next appointment, please call your pharmacy.

## 2016-12-22 NOTE — Progress Notes (Signed)
Referred to [facility] clinic by Dr Graciela Husbands during office visit today.  ICM intro given in office.  Patients device showed she has had some fluid accumulation over the last couple of months but is starting to improve.  ICM remote transmission scheduled for 01/07/2017.  Provided ICM direct number and encouraged to call for any fluid symptoms.

## 2016-12-22 NOTE — Progress Notes (Signed)
Patient Care Team: Patient, No Pcp Per as PCP - General (General Practice) Delma FreezeHackney, Tina A, FNP as Nurse Practitioner (Cardiology) Duke SalviaKlein, Shahad Mazurek C, MD as Consulting Physician (Cardiology) Wendall StadeNishan, Peter C, MD as Consulting Physician (Cardiology)   HPI  Erin [name] is a 71 y.o. female Seen in followup for ICD implantation the context of a nonischemic and ischemic cardiomyopathy.echocardiogram-- 5/14 EF 25%  She has chronic systolic heart failure in the context of atrial fibrillation-paroxysmal. She took amiodarone and apixoban./  Amiio was then stopped.  She had developed amio assoc hyperthyroidism--Rx with methimazole Saw Dr Everardo AllEllison         Date          TSH      ALT  8/18           4.9        16  11/18  2.13     DATE TEST    8/16 Echo   EF 25 % (44/2.1/35)m LAE  8/18 Echo EF 20-25%   8/18 Cath  T Cx///90% LAD>>DES  11/18 Echo  EF 25-30%   She was hospitalized 8/18 following ICD discharge. The strips were reviewed. She had sustained ventricular tachycardia which failed with antitachycardia pacing. She also had nonsustained ventricular tachycardia below detection. Amiodarone was re- initiated.    DAPT + apixoban; Now on Clopidigrel and apixoban   No bleeding issues  No fib of note  Denies chest pain or edema   Has chronic DOE     Past Medical History:  Diagnosis Date  . Anginal pain (HCC)   . Automatic implantable cardioverter-defibrillator in situ    a. s/p MDT ICD 07/2012; b. SN # [id number]   . CAD (coronary artery disease)    a. cath 2003: LM nl, mid LAD 50%, LCx nl, RCA nl b. L&RHC 08/17/2014 100% occluded mid LCx, 70% mid LAD with FFR 0.82. Medical therapy. CI 2.1. CO 4.01c. LHC 8/18: CTO LCx w/ R-L colatts, LAD s/p PCI/DES x 2  . Chronic systolic CHF (congestive heart failure) (HCC)    a. echo 2014: EF 25%, diffuse HK, LA moderately dilated, RA mildly dilated, PASP 38 mm Hg; b. echo 08/2014: EF 25-30%, diffuse HK, RWMA cannot be excluded, mild MR, mild  biatrial enlargement, RV mildly dilated, wall thickness nl, pacer wire/catheter noted, mild to mod TR, PASP high nl, c. TTE 8/18: EF 25-30%, diffuse HK, GR1DD, mod MR, mod dilated LA, nl RV sys fxn, PASP 56  . Encounter for long-term (current) use of anticoagulants   . Exertional shortness of breath   . H/O medication noncompliance   . High cholesterol   . Hypertension   . NICM (nonischemic cardiomyopathy) (HCC)    a. s/p AICD 07/22/2012  . Obesity   . OSA on CPAP    "suppose to; not often" (07/22/2012)  . Other "heavy-for-dates" infants   . PAF (paroxysmal atrial fibrillation) (HCC)    a. on warfarin--> Eliquis 08/2014    Past Surgical History:  Procedure Laterality Date  . ABDOMINAL HYSTERECTOMY  ~ 1998   "laparoscopic" (07/22/2012)  . CARDIAC CATHETERIZATION  ? 1990's  . CARDIAC CATHETERIZATION N/A 08/17/2014   Procedure: Right/Left Heart Cath and Coronary Angiography;  Surgeon: Kathleene Hazelhristopher D McAlhany, MD;  [facility]AS[facility] LAB;  Service: Cardiovascular;  Laterality: N/A;  . CARDIAC DEFIBRILLATOR PLACEMENT  07/22/2012   dual-chamber ICD.  Marland Kitchen. CORONARY STENT INTERVENTION N/A 08/24/2016   Procedure: CORONARY STENT INTERVENTION;  Surgeon: Kirke CorinArida,  Chelsea Aus, MD;  Location: [facility] INVASIVE CV LAB;  Service: Cardiovascular;  Laterality: N/A;  . IMPLANTABLE CARDIOVERTER DEFIBRILLATOR IMPLANT N/A 07/22/2012   Procedure: IMPLANTABLE CARDIOVERTER DEFIBRILLATOR IMPLANT;  Surgeon: Marinus Maw, MD;  Location: [facility] CATH LAB;  Service: Cardiovascular;  Laterality: N/A;  . LEFT HEART CATH AND CORONARY ANGIOGRAPHY N/A 08/24/2016   Procedure: LEFT HEART CATH AND CORONARY ANGIOGRAPHY;  Surgeon: Iran Ouch, MD;  Location: [facility] INVASIVE CV LAB;  Service: Cardiovascular;  Laterality: N/A;  . TEE WITHOUT CARDIOVERSION N/A 08/21/2014   Procedure: TRANSESOPHAGEAL ECHOCARDIOGRAM (TEE);  Surgeon: Chilton Si, MD;  Location: [facility] ENDOSCOPY;  Service: Cardiovascular;  Laterality: N/A;  . TUBAL LIGATION   ~ 1978    Current Outpatient Medications  Medication Sig Dispense Refill  . amiodarone (PACERONE) 200 MG tablet Take 1 tablet (200 mg total) by mouth daily. 60 tablet 6  . apixaban (ELIQUIS) 5 MG TABS tablet Take 1 tablet (5 mg total) by mouth 2 (two) times daily. 180 tablet 3  . atorvastatin (LIPITOR) 40 MG tablet Take 1 tablet (40 mg total) by mouth daily at 6 PM. 30 tablet 6  . carvedilol (COREG) 3.125 MG tablet Take 1 tablet (3.125 mg total) by mouth 2 (two) times daily with a meal. 60 tablet 6  . clopidogrel (PLAVIX) 75 MG tablet Take 1 tablet (75 mg total) by mouth daily with breakfast. 30 tablet 6  . furosemide (LASIX) 40 MG tablet Take 1 tablet (40 mg total) by mouth daily. 30 tablet 6  . methimazole (TAPAZOLE) 10 MG tablet Take 1 tablet (10 mg total) by mouth 2 (two) times daily. 60 tablet 3  . sacubitril-valsartan (ENTRESTO) 49-51 MG Take 1 tablet by mouth 2 (two) times daily. 60 tablet 5  . spironolactone (ALDACTONE) 25 MG tablet Take 0.5 tablets (12.5 mg total) by mouth daily. 30 tablet 3   No current facility-administered medications for this visit.     No Known Allergies  Review of Systems negative except from HPI and PMH  Physical Exam BP 124/60 (BP Location: Left Arm, Patient Position: Sitting, Cuff Size: Normal)   Pulse 76   Ht 5' (1.524 m)   Wt 203 lb 4 oz (92.2 kg)   BMI 39.69 kg/m  Well developed and Morbidly obese in no acute distress HENT normal Neck supple with JVP-8 Clear Regular rate and rhythm, no murmurs or gallops Abd-soft with active BS No Clubbing cyanosis 1+edema Skin-warm and dry A & Oriented  Grossly normal sensory and motor function   ECG demonstrates  sinus at 86 Intervals 18/09/42     Assessment and  Plan  Paroxysmal atrial fibrillation  Nonischemic cardiomyopathy  Coronary artery disease-2 vessel with recent stenting  Ventricular tachycardia-treated  Implantable defibrillator-[employer]  Treated hyperthyroidism  Congestive  heart failure-acute/chronic-systolic   No intercurrent atrial fibrillation or flutter   Continue amiodarone  Without symptoms of ischemia  Last [id number] was not at goal  Will ask her to follow up with Primary Cardiology  No intercurrent Ventricular tachycardia  Guideline directed medical therapy for cardiomypathy  Will reassess K on aldactone

## 2016-12-24 ENCOUNTER — Telehealth: Payer: Self-pay | Admitting: *Deleted

## 2016-12-24 ENCOUNTER — Encounter: Payer: Self-pay | Admitting: Cardiology

## 2016-12-24 NOTE — Telephone Encounter (Signed)
Received fax from [facility], they have received application for assistance with ELIQUIS. They will review and respond within 2 days.

## 2017-01-07 ENCOUNTER — Telehealth: Payer: Self-pay | Admitting: Cardiology

## 2017-01-07 NOTE — Progress Notes (Signed)
ICM remote transmission reschedule from 01/07/2017 to 02/09/2017 due to patient is out of town for the month of January

## 2017-01-07 NOTE — Telephone Encounter (Signed)
Spoke with pt and reminded pt of remote transmission that is due today. Pt verbalized understanding.   

## 2017-01-08 ENCOUNTER — Ambulatory Visit: Payer: Medicare Other | Admitting: Family

## 2017-01-11 ENCOUNTER — Institutional Professional Consult (permissible substitution): Payer: Medicare Other | Admitting: Internal Medicine

## 2017-01-11 ENCOUNTER — Telehealth: Payer: Self-pay | Admitting: *Deleted

## 2017-01-11 NOTE — Telephone Encounter (Signed)
RESEARCH ENCOUNTER  Patient ID: Erin [name]  DOB: [birth year]  [name] mests inclusion/exclusion criteria for the [employer] HF research study.  Attempted to contact subject via phone to discuss study details.  Left message and provided contact number.  Will mail ICF and contact information to subject's home address and follow-up later this week

## 2017-01-12 ENCOUNTER — Ambulatory Visit: Payer: Medicare Other | Admitting: Endocrinology

## 2017-01-15 ENCOUNTER — Telehealth: Payer: Self-pay | Admitting: Cardiovascular Disease

## 2017-01-15 NOTE — Telephone Encounter (Signed)
This information needs to be given to the assistance program and she would need to call them directly with that information.

## 2017-01-15 NOTE — Telephone Encounter (Signed)
Eliquis assistance program needs ID number from Medicare card to process patient application .  LMOV for patient to call with this information   Reference # [id number]  Fax : [phone]

## 2017-01-15 NOTE — Telephone Encounter (Signed)
Pt calling back with Medicare ID  [id number]

## 2017-01-19 NOTE — Telephone Encounter (Signed)
Fax received today that the patient's applications for eliquis patient assistance was received by [employer] and being reviewed.

## 2017-01-25 ENCOUNTER — Ambulatory Visit: Payer: Medicare Other | Admitting: Family

## 2017-01-29 ENCOUNTER — Other Ambulatory Visit: Payer: Self-pay | Admitting: Internal Medicine

## 2017-02-09 ENCOUNTER — Telehealth: Payer: Self-pay | Admitting: Cardiology

## 2017-02-09 NOTE — Telephone Encounter (Signed)
LMOVM reminding pt to send remote transmission.   

## 2017-03-01 ENCOUNTER — Ambulatory Visit (INDEPENDENT_AMBULATORY_CARE_PROVIDER_SITE_OTHER): Payer: Medicare Other

## 2017-03-01 ENCOUNTER — Telehealth: Payer: Self-pay

## 2017-03-01 DIAGNOSIS — Z95810 Presence of automatic (implantable) cardiac defibrillator: Secondary | ICD-10-CM | POA: Diagnosis not present

## 2017-03-01 DIAGNOSIS — I5022 Chronic systolic (congestive) heart failure: Secondary | ICD-10-CM

## 2017-03-01 NOTE — Telephone Encounter (Signed)
Pt returning our call °Please call back ° °

## 2017-03-01 NOTE — Progress Notes (Signed)
EPIC Encounter for ICM Monitoring  Patient Name: Erin [name] is a 72 y.o. female Date: 03/01/2017 Primary Care Physican: Patient, No Pcp Per Primary Cardiologist: Mariah Milling Electrophysiologist: Graciela Husbands Dry Weight: 199 lbs (weighs several times a week)       1st ICM remote.  Heart Failure questions reviewed, pt asymptomatic.  She does have sinus congestion.    Thoracic impedance normal.  Prescribed dosage: Furosemide 40 mg 1 tablet daily  Recommendations: No changes.  Advised to limit salt intake to 2000 mg/day and fluid intake to < 2 liters/day.  Encouraged to call for fluid symptoms.  Follow-up plan: ICM clinic phone appointment on 04/01/2017.    Copy of ICM check sent to Dr. Graciela Husbands.   3 month ICM trend: 03/01/2017    1 Year ICM trend:       Karie Soda, RN 03/01/2017 11:29 AM

## 2017-03-01 NOTE — Telephone Encounter (Addendum)
Patient does not have prescription coverage through [employer]. I have provided [employer] information and patient will call them for [facility] assistance today. She will call back to report the outcome. She only has enough Entresto and Eliquis to last through Wednesday morning but if she is able to receive assistance for [facility], she will be able to afford $45/month for Eliquis. Samples have been left at the front desk for patient until assistance can be obtained.  Samples of Eliquis 5mg  were given to the patient, quantity 1 box, Lot Number [id number] Exp: 3/21  Samples of Entresto were given to the patient, quantity 2 boxes, Lot Number [id number] Exp: 1/21

## 2017-03-01 NOTE — Telephone Encounter (Signed)
Patient states she is unable to afford Eliquis $45/month and Entresto $45/month. She has [employer] as well as [employer]; is unsure if she has prescription benefits through [employer]. She will call them and call back to report.

## 2017-03-02 NOTE — Telephone Encounter (Signed)
S/w patient who called [employer] yesterday. She was told there were no funds available for [facility] assistance and advised her to continue to call back twice weekly to inquire if donations had been received. She states she will try and continue to pay $45/month each for Eliquis and Entresto. She understands the importance of not missing any doses and will call our office if she finds she is unable to have medications refilled. Daughter will pick up samples today.

## 2017-04-01 ENCOUNTER — Ambulatory Visit: Payer: Medicare Other | Admitting: *Deleted

## 2017-04-02 NOTE — Progress Notes (Signed)
Remote ICD transmission.   

## 2017-04-05 ENCOUNTER — Encounter: Payer: Self-pay | Admitting: Cardiology

## 2017-04-09 NOTE — Progress Notes (Signed)
No ICM remote transmission received for 04/01/2017 and next ICM transmission scheduled for 04/22/2017.

## 2017-04-22 ENCOUNTER — Telehealth: Payer: Self-pay | Admitting: Cardiology

## 2017-04-22 NOTE — Telephone Encounter (Signed)
LMOVM reminding pt to send remote transmission.   

## 2017-04-23 NOTE — Progress Notes (Signed)
No ICM remote transmission received for 04/22/2017 due to patient was out of town and next ICM transmission scheduled for 05/10/2017.

## 2017-05-07 ENCOUNTER — Other Ambulatory Visit: Payer: Self-pay | Admitting: Internal Medicine

## 2017-05-10 ENCOUNTER — Telehealth: Payer: Self-pay

## 2017-05-10 ENCOUNTER — Other Ambulatory Visit: Payer: Self-pay | Admitting: Internal Medicine

## 2017-05-10 ENCOUNTER — Ambulatory Visit (INDEPENDENT_AMBULATORY_CARE_PROVIDER_SITE_OTHER): Payer: Medicare Other

## 2017-05-10 DIAGNOSIS — Z95810 Presence of automatic (implantable) cardiac defibrillator: Secondary | ICD-10-CM

## 2017-05-10 DIAGNOSIS — I5022 Chronic systolic (congestive) heart failure: Secondary | ICD-10-CM

## 2017-05-10 NOTE — Telephone Encounter (Signed)
LMOVM reminding pt to send remote transmission.   

## 2017-05-11 ENCOUNTER — Telehealth: Payer: Self-pay

## 2017-05-11 NOTE — Telephone Encounter (Signed)
Reviewed the patient's chart.  She is currently on: - eliquis 5 mg BID for a-fib - plavix 75 mg once daily for PCI done 08/2016 with 2 overlapping DES- recommendation was DAPT therapy x 1 year.  To Dr. Mariah Milling to confirm that the patient should remain on both plavix and eliquis for the present time.

## 2017-05-11 NOTE — Telephone Encounter (Signed)
Patient left message after speaking with her regarding ICM remote transmission. She requested return call. Spoke with patient.  She stated [facility] was hesitant to fill her Plavix prescription and explained to her the risk of taking both Plavix and Eliquis.  She is questioning if it is necessary to continue taking both of these medications.  Advised I would send a note to Dr Ethelene Hal nurse and she will contact her regarding her concern.

## 2017-05-11 NOTE — Telephone Encounter (Signed)
Outpatient Medication Detail    Disp Refills Start End   clopidogrel (PLAVIX) 75 MG tablet 90 tablet 2 05/07/2017    Sig: TAKE 1 TABLET BY MOUTH ONCE DAILY WITH BREAKFAST   Sent to pharmacy as: clopidogrel (PLAVIX) 75 MG tablet   E-Prescribing Status: Receipt confirmed by pharmacy (05/07/2017 3:19 PM EDT)   Pharmacy   [id number] - [place] [address], [place] - [phone]

## 2017-05-11 NOTE — Progress Notes (Signed)
EPIC Encounter for ICM Monitoring  Patient Name: Erin [name] is a 72 y.o. female Date: 05/11/2017 Primary Care Physican: Patient, No Pcp Per Primary Cardiologist: Mariah Milling Electrophysiologist: Graciela Husbands Dry Weight:  202 lbs (weighs several times a week)       Heart Failure questions reviewed, pt asymptomatic. Traveling during decreased impedance and eating foods higher in salt.    Thoracic impedance normal but was abnormal suggesting fluid accumulation from 04/22/2017 - 05/04/2017.  Prescribed dosage: Furosemide 40 mg 1 tablet daily  Recommendations: No changes.  Reinforced sodium restriction.  Encouraged to call for fluid symptoms.  Follow-up plan: ICM clinic phone appointment on 06/10/2017.    Copy of ICM check sent to Dr. Graciela Husbands.   3 month ICM trend: 05/11/2017    1 Year ICM trend:       Karie Soda, RN 05/11/2017 10:31 AM

## 2017-05-12 NOTE — Telephone Encounter (Signed)
Spoke with pharmacy and advised that she is in fact on both and that we did send request for provider to review and then we will contact patient if there are any changes needed.

## 2017-05-25 ENCOUNTER — Other Ambulatory Visit: Payer: Self-pay | Admitting: Internal Medicine

## 2017-06-10 ENCOUNTER — Telehealth: Payer: Self-pay | Admitting: Cardiology

## 2017-06-10 NOTE — Telephone Encounter (Signed)
LMOVM reminding pt to send remote transmission.   

## 2017-06-18 NOTE — Progress Notes (Signed)
No ICM remote transmission received for 06/10/2017 and next ICM transmission scheduled for 07/12/2017.    

## 2017-07-05 ENCOUNTER — Other Ambulatory Visit: Payer: Self-pay | Admitting: Internal Medicine

## 2017-07-12 ENCOUNTER — Encounter: Payer: Medicare Other | Admitting: *Deleted

## 2017-07-12 ENCOUNTER — Telehealth: Payer: Self-pay | Admitting: Cardiology

## 2017-07-12 NOTE — Telephone Encounter (Signed)
LMOVM reminding pt to send remote transmission.   

## 2017-07-14 ENCOUNTER — Encounter: Payer: Self-pay | Admitting: Cardiology

## 2017-07-16 NOTE — Progress Notes (Signed)
No ICM remote transmission received for 07/12/2017 and next ICM transmission scheduled for 08/09/2017.    

## 2017-08-10 ENCOUNTER — Telehealth: Payer: Self-pay

## 2017-08-10 NOTE — Telephone Encounter (Signed)
LMOVM reminding pt to send remote transmission.   

## 2017-08-12 NOTE — Progress Notes (Signed)
No ICM remote transmission received for 08/09/2017 and next ICM transmission scheduled for 08/26/2017.    

## 2017-08-26 ENCOUNTER — Telehealth: Payer: Self-pay

## 2017-08-26 ENCOUNTER — Encounter: Payer: Medicare Other | Admitting: *Deleted

## 2017-08-26 NOTE — Telephone Encounter (Signed)
LMOVM reminding pt to send remote transmission.   

## 2017-08-27 ENCOUNTER — Encounter: Payer: Self-pay | Admitting: Cardiology

## 2017-08-30 NOTE — Progress Notes (Signed)
No ICM remote transmission received for 08/26/2017 and next ICM transmission scheduled for 09/27/2017.

## 2017-09-17 ENCOUNTER — Other Ambulatory Visit: Payer: Self-pay | Admitting: Family

## 2017-09-20 ENCOUNTER — Other Ambulatory Visit: Payer: Self-pay

## 2017-09-20 NOTE — Telephone Encounter (Signed)
*  STAT* If patient is at the pharmacy, call can be transferred to refill team.   1. Which medications need to be refilled? (please list name of each medication and dose if known) Entresto  2. Which pharmacy/location (including street and city if local pharmacy) is medication to be sent to? [facility] [address]  3. Do they need a 30 day or 90 day supply? 30   

## 2017-09-22 ENCOUNTER — Other Ambulatory Visit: Payer: Self-pay | Admitting: Cardiovascular Disease

## 2017-09-22 MED ORDER — APIXABAN 5 MG PO TABS: 5.0000 mg | ORAL_TABLET | Freq: Two times a day (BID) | ORAL | 3 refills | Status: DC

## 2017-09-22 NOTE — Telephone Encounter (Signed)
°*  STAT* If patient is at the pharmacy, call can be transferred to refill team.   1. Which medications need to be refilled? (please list name of each medication and dose if known) Entresto   2. Which pharmacy/location (including street and city if local pharmacy) is medication to be sent to? [facility] [address]   3. Do they need a 30 day or 90 day supply? 30 day

## 2017-09-23 ENCOUNTER — Other Ambulatory Visit: Payer: Self-pay

## 2017-09-23 NOTE — Telephone Encounter (Signed)
Review for refill. 

## 2017-09-23 NOTE — Telephone Encounter (Signed)
*  STAT* If patient is at the pharmacy, call can be transferred to refill team.   1. Which medications need to be refilled? (please list name of each medication and dose if known) Entresto  2. Which pharmacy/location (including street and city if local pharmacy) is medication to be sent to? [facility] [address]  3. Do they need a 30 day or 90 day supply? 30

## 2017-09-30 ENCOUNTER — Other Ambulatory Visit: Payer: Self-pay | Admitting: Internal Medicine

## 2017-09-30 ENCOUNTER — Other Ambulatory Visit: Payer: Self-pay | Admitting: Physician Assistant

## 2017-10-07 NOTE — Progress Notes (Signed)
No ICM remote transmission received for 09/27/2017 and next ICM transmission scheduled for 10/28/2017.

## 2017-10-28 ENCOUNTER — Encounter: Payer: Self-pay | Admitting: Cardiology

## 2017-10-28 ENCOUNTER — Telehealth: Payer: Self-pay

## 2017-10-28 NOTE — Telephone Encounter (Signed)
LMOVM reminding pt to send remote transmission.  Monthly check with Laurie  

## 2017-11-01 ENCOUNTER — Ambulatory Visit: Payer: Medicare Other | Admitting: Cardiovascular Disease

## 2017-11-25 NOTE — Progress Notes (Signed)
Unable to reach member for monthly ICM follow since 05/10/2017.  Patient disenrolled from [facility] clinic but will be monitored every 91 days per device clinic protocol.

## 2017-11-30 ENCOUNTER — Telehealth: Payer: Self-pay | Admitting: Cardiology

## 2017-11-30 NOTE — Telephone Encounter (Signed)
LMOVM reminding pt to send remote transmission.   

## 2017-12-01 ENCOUNTER — Encounter: Payer: Self-pay | Admitting: Cardiology

## 2017-12-05 NOTE — Progress Notes (Signed)
Cardiology Office Note  Date:  12/07/2017   ID:  SAHITHI BOWERMASTER, DOB [birth year], MRN [id number]  PCP:  Patient, No Pcp Per   Chief Complaint  Patient presents with  . OTHER    6 month f/u no complaints today. Meds reviewed verbally with pt.    HPI:  72 year old woman with known  coronary artery disease,  paroxysmal atrial fibrillation,  chronic systolic CHF   Medtronic ICD placed July 2014 lost to follow-up for the past 2 years, Recently ran out of her medications who presented to [facility] 08/20/2016  after episode of weakness, ICD shock while she was at the [employer] Ejection fraction 25 to 30% in November 2018 Right heart pressures of 70 at that time PCI of mid LAD disease, 2 drug-eluting stents. 08/2016 occluded left circumflex with right-to-left collaterals.  She presents for follow-up of her cardiomyopathy, sustained VT   ICD measurements closely followed by Randon Goldsmith, not since 05/2017 Lawson Fiscal has been unable to contact her by phone  Did not take medications this AM Reports that she is doing well overall, denies any leg swelling abdominal bloating weight gain,  No significant shortness of breath or chest tightness No PND orthopnea No regular exercise program Poor diet, high carbohydrates, lots of juice  Difficulty affording both Entresto and Eliquis Continues to take Lasix 40 daily, occasional extra Lasix after lunch for leg edema  EKG personally reviewed by myself on todays visit Shows normal sinus rhythm rate 85 bpm no significant ST or T wave changes  Past medical history reviewed August 2018 Medtronic ICD device interrogated showing appropriate shock for VT  numerous episodes of VT with rate 167 up to 200 bpm, no therapy given Device reprogrammed 08/20/2016 Started on amiodarone infusion, Echocardiogram at that time ejection fraction 25% with dilated LV, global hypokinesis Stress test results discussed with her in detail, anterior wall ischemia  cardiac  catheterization showed significant two-vessel coronary artery disease with known chronically occluded left circumflex with right-to-left collaterals. There was progression of mid LAD disease.  -- treated successfully with PCI and 2 overlapped drug-eluting stent placement.  First diagonal was jailed by the stent with sluggish flow but no significant ostial stenosis.    PMH:   has a past medical history of Anginal pain (HCC), Automatic implantable cardioverter-defibrillator in situ, CAD (coronary artery disease), Chronic systolic CHF (congestive heart failure) (HCC), Encounter for long-term (current) use of anticoagulants, Exertional shortness of breath, H/O medication noncompliance, High cholesterol, Hypertension, NICM (nonischemic cardiomyopathy) (HCC), Obesity, OSA on CPAP, Other "heavy-for-dates" infants, and PAF (paroxysmal atrial fibrillation) (HCC).  PSH:    Past Surgical History:  Procedure Laterality Date  . ABDOMINAL HYSTERECTOMY  ~ 1998   "laparoscopic" (07/22/2012)  . CARDIAC CATHETERIZATION  ? 1990's  . CARDIAC CATHETERIZATION N/A 08/17/2014   Procedure: Right/Left Heart Cath and Coronary Angiography;  Surgeon: Kathleene Hazel, MD;  Location: [facility] INVASIVE CV LAB;  Service: Cardiovascular;  Laterality: N/A;  . CARDIAC DEFIBRILLATOR PLACEMENT  07/22/2012   dual-chamber ICD.  Marland Kitchen CORONARY STENT INTERVENTION N/A 08/24/2016   Procedure: CORONARY STENT INTERVENTION;  Surgeon: Iran Ouch, MD;  Location: [facility] INVASIVE CV LAB;  Service: Cardiovascular;  Laterality: N/A;  . IMPLANTABLE CARDIOVERTER DEFIBRILLATOR IMPLANT N/A 07/22/2012   Procedure: IMPLANTABLE CARDIOVERTER DEFIBRILLATOR IMPLANT;  Surgeon: Marinus Maw, MD;  Location: [facility] CATH LAB;  Service: Cardiovascular;  Laterality: N/A;  . LEFT HEART CATH AND CORONARY ANGIOGRAPHY N/A 08/24/2016   Procedure: LEFT HEART CATH AND CORONARY ANGIOGRAPHY;  Surgeon: Kirke Corin,  Chelsea Aus, MD;  Location: [facility] INVASIVE CV LAB;  Service:  Cardiovascular;  Laterality: N/A;  . TEE WITHOUT CARDIOVERSION N/A 08/21/2014   Procedure: TRANSESOPHAGEAL ECHOCARDIOGRAM (TEE);  Surgeon: Chilton Si, MD;  Location: [facility] ENDOSCOPY;  Service: Cardiovascular;  Laterality: N/A;  . TUBAL LIGATION  ~ 1978    Current Outpatient Medications  Medication Sig Dispense Refill  . amiodarone (PACERONE) 200 MG tablet TAKE 1 TABLET BY MOUTH ONCE DAILY 60 tablet 6  . apixaban (ELIQUIS) 5 MG TABS tablet Take 1 tablet (5 mg total) by mouth 2 (two) times daily. 60 tablet 3  . atorvastatin (LIPITOR) 40 MG tablet TAKE 1 TABLET BY MOUTH ONCE DAILY AT  6  PM 90 tablet 2  . carvedilol (COREG) 3.125 MG tablet TAKE 1 TABLET BY MOUTH TWICE DAILY WITH A MEAL 180 tablet 2  . clopidogrel (PLAVIX) 75 MG tablet TAKE 1 TABLET BY MOUTH ONCE DAILY WITH  BREAKFAST 90 tablet 2  . furosemide (LASIX) 40 MG tablet TAKE 1 TABLET BY MOUTH ONCE DAILY 90 tablet 1  . methimazole (TAPAZOLE) 10 MG tablet TAKE 1 TABLET BY MOUTH TWICE DAILY 180 tablet 3  . sacubitril-valsartan (ENTRESTO) 49-51 MG Take 1 tablet by mouth 2 (two) times daily. 60 tablet 5  . spironolactone (ALDACTONE) 25 MG tablet TAKE 1/2 (ONE-HALF) TABLET BY MOUTH ONCE DAILY 45 tablet 2   No current facility-administered medications for this visit.      Allergies:   Patient has no known allergies.   Social History:  The patient  reports that she has never smoked. She has never used smokeless tobacco. She reports that she does not drink alcohol or use drugs.   Family History:   family history includes Diabetes in her sister; Heart disease in her father and mother; Heart failure in her brother; Lung disease in her sister.    Review of Systems: Review of Systems  Constitutional: Negative.   Respiratory: Negative.   Cardiovascular: Negative.   Gastrointestinal: Negative.   Musculoskeletal: Negative.   Neurological: Negative.   Psychiatric/Behavioral: Negative.   All other systems reviewed and are  negative.    PHYSICAL EXAM: VS:  BP 138/78 (BP Location: Left Arm, Patient Position: Sitting, Cuff Size: Large)   Pulse 85   Ht 5' (1.524 m)   Wt 209 lb 4 oz (94.9 kg)   BMI 40.87 kg/m  , BMI Body mass index is 40.87 kg/m. Constitutional:  oriented to person, place, and time. No distress. Obese  hENT:  Head: Normocephalic and atraumatic.  Eyes:  no discharge. No scleral icterus.  Neck: Normal range of motion. Neck supple. JVD 8+ Cardiovascular: Normal rate, regular rhythm, normal heart sounds and intact distal pulses. Exam reveals no gallop and no friction rub. No edema No murmur heard. Pulmonary/Chest: Effort normal and breath sounds normal. No stridor. No respiratory distress.  no wheezes.  no rales.  no tenderness.  Abdominal: Soft.  no distension.  no tenderness.  Musculoskeletal: Normal range of motion.  no  tenderness or deformity.  Neurological:  normal muscle tone. Coordination normal. No atrophy Skin: Skin is warm and dry. No rash noted. not diaphoretic.  Psychiatric:  normal mood and affect. behavior is normal. Thought content normal.    Recent Labs: No results found for requested labs within last 8760 hours.    Lipid Panel Lab Results  Component Value Date   CHOL 130 08/21/2014   HDL 25 (L) 08/21/2014   LDLCALC 91 08/21/2014   TRIG 72 08/21/2014  Wt Readings from Last 3 Encounters:  12/07/17 209 lb 4 oz (94.9 kg)  12/22/16 203 lb 4 oz (92.2 kg)  12/04/16 206 lb (93.4 kg)       ASSESSMENT AND PLAN:  Atherosclerosis of native coronary artery of native heart with stable angina pectoris (HCC) stent placement x2, on Plavix, no anginal symptoms No further ischemic work-up at this time  Congestive dilated cardiomyopathy (HCC) Continue Lasix 40 daily, Extra Lasix in the afternoon for weight gain, leg swelling abdominal bloating Lab work today in the office Difficulty affording Entresto, did not qualify for assistance, no samples available today Did  not take her medications this morning  Atrial fibrillation with RVR (HCC) - Plan: EKG 12-Lead Maintaining normal sinus rhythm, will continue anticoagulation ICD check today with Dr. Graciela Husbands  Essential hypertension Did not take her medications today, typically takes these at 9 AM No adjustments made  Ventricular tachycardia (HCC) Several prior echocardiograms with  ejection fraction 25% to 30% On amiodarone 200 daily Appears relatively euvolemic  Chronic systolic heart failure (HCC) Appears relatively euvolemic No medication changes made   Total encounter time more than 25 minutes  Greater than 50% was spent in counseling and coordination of care with the patient   Disposition:   F/U  12 months   Orders Placed This Encounter  Procedures  . EKG 12-Lead     Signed, Dossie Arbour, M.D., Ph.D. 12/07/2017  [facility] [place], [place] [phone]

## 2017-12-07 ENCOUNTER — Ambulatory Visit (INDEPENDENT_AMBULATORY_CARE_PROVIDER_SITE_OTHER): Payer: Medicare Other | Admitting: Internal Medicine

## 2017-12-07 ENCOUNTER — Encounter: Payer: Self-pay | Admitting: Internal Medicine

## 2017-12-07 ENCOUNTER — Encounter: Payer: Self-pay | Admitting: Cardiovascular Disease

## 2017-12-07 ENCOUNTER — Ambulatory Visit (INDEPENDENT_AMBULATORY_CARE_PROVIDER_SITE_OTHER): Payer: Medicare Other | Admitting: Cardiovascular Disease

## 2017-12-07 VITALS — BP 138/78 | HR 85 | Ht 60.0 in | Wt 209.2 lb

## 2017-12-07 DIAGNOSIS — I5022 Chronic systolic (congestive) heart failure: Secondary | ICD-10-CM

## 2017-12-07 DIAGNOSIS — Z95810 Presence of automatic (implantable) cardiac defibrillator: Secondary | ICD-10-CM | POA: Diagnosis not present

## 2017-12-07 DIAGNOSIS — I472 Ventricular tachycardia, unspecified: Secondary | ICD-10-CM

## 2017-12-07 DIAGNOSIS — I480 Paroxysmal atrial fibrillation: Secondary | ICD-10-CM

## 2017-12-07 DIAGNOSIS — I25118 Atherosclerotic heart disease of native coronary artery with other forms of angina pectoris: Secondary | ICD-10-CM

## 2017-12-07 DIAGNOSIS — I428 Other cardiomyopathies: Secondary | ICD-10-CM

## 2017-12-07 DIAGNOSIS — Z79899 Other long term (current) drug therapy: Secondary | ICD-10-CM

## 2017-12-07 DIAGNOSIS — Z23 Encounter for immunization: Secondary | ICD-10-CM

## 2017-12-07 DIAGNOSIS — I272 Pulmonary hypertension, unspecified: Secondary | ICD-10-CM

## 2017-12-07 DIAGNOSIS — I10 Essential (primary) hypertension: Secondary | ICD-10-CM

## 2017-12-07 MED ORDER — FUROSEMIDE 40 MG PO TABS: 40.0000 mg | ORAL_TABLET | Freq: Every day | ORAL | 2 refills | Status: DC

## 2017-12-07 MED ORDER — APIXABAN 5 MG PO TABS: 5.0000 mg | ORAL_TABLET | Freq: Two times a day (BID) | ORAL | 11 refills | Status: DC

## 2017-12-07 NOTE — Progress Notes (Signed)
Patient Care Team: Patient, No Pcp Per as PCP - General (General Practice) Delma Freeze, FNP as Nurse Practitioner (Cardiology) Duke Salvia, MD as Consulting Physician (Cardiology) Antonieta Iba, MD as Consulting Physician (Cardiology)   HPI  Erin [name] is a 72 y.o. female Seen in followup for ICD implanted in  the context of a nonischemic and ischemic cardiomyopathy.echocardiogram-- 5/14 EF 25%  She has chronic systolic heart failure related to  atrial fibrillation-paroxysmal. She took amiodarone and apixoban.  Amio was stopped 2/2 amio assoc hyperthyroidism--Rx with methimazole Saw Dr Everardo All         Date TSH      ALT  8/18 4.9        16  11/18  2.13     DATE TEST EF   5/14 Echo  20-25%   8/16 Echo  25 % (44/2.1/35)m LAE  8/18 Echo  20-25%   8/18 Cath  T Cx///90% LAD>>DES  11/18 Echo   25-30%   She was hospitalized 8/18 following ICD discharge. The strips were reviewed. She had sustained ventricular tachycardia which failed with antitachycardia pacing. She also had nonsustained ventricular tachycardia below detection. Amiodarone re-initiated.    DAPT + apixoban; Now on Clopidigrel and apixoban  No bleeding  Some SOB and edema  No chest pain  No syncope       Past Medical History:  Diagnosis Date  . Anginal pain (HCC)   . Automatic implantable cardioverter-defibrillator in situ    a. s/p MDT ICD 07/2012; b. SN # [id number]   . CAD (coronary artery disease)    a. cath 2003: LM nl, mid LAD 50%, LCx nl, RCA nl b. L&RHC 08/17/2014 100% occluded mid LCx, 70% mid LAD with FFR 0.82. Medical therapy. CI 2.1. CO 4.01c. LHC 8/18: CTO LCx w/ R-L colatts, LAD s/p PCI/DES x 2  . Chronic systolic CHF (congestive heart failure) (HCC)    a. echo 2014: EF 25%, diffuse HK, LA moderately dilated, RA mildly dilated, PASP 38 mm Hg; b. echo 08/2014: EF 25-30%, diffuse HK, RWMA cannot be excluded, mild MR, mild biatrial enlargement, RV mildly dilated, wall thickness  nl, pacer wire/catheter noted, mild to mod TR, PASP high nl, c. TTE 8/18: EF 25-30%, diffuse HK, GR1DD, mod MR, mod dilated LA, nl RV sys fxn, PASP 56  . Encounter for long-term (current) use of anticoagulants   . Exertional shortness of breath   . H/O medication noncompliance   . High cholesterol   . Hypertension   . NICM (nonischemic cardiomyopathy) (HCC)    a. s/p AICD 07/22/2012  . Obesity   . OSA on CPAP    "suppose to; not often" (07/22/2012)  . Other "heavy-for-dates" infants   . PAF (paroxysmal atrial fibrillation) (HCC)    a. on warfarin--> Eliquis 08/2014    Past Surgical History:  Procedure Laterality Date  . ABDOMINAL HYSTERECTOMY  ~ 1998   "laparoscopic" (07/22/2012)  . CARDIAC CATHETERIZATION  ? 1990's  . CARDIAC CATHETERIZATION N/A 08/17/2014   Procedure: Right/Left Heart Cath and Coronary Angiography;  Surgeon: Kathleene Hazel, MD;  Location: [facility] INVASIVE CV LAB;  Service: Cardiovascular;  Laterality: N/A;  . CARDIAC DEFIBRILLATOR PLACEMENT  07/22/2012   dual-chamber ICD.  Marland Kitchen CORONARY STENT INTERVENTION N/A 08/24/2016   Procedure: CORONARY STENT INTERVENTION;  Surgeon: Iran Ouch, MD;  Location: [facility] INVASIVE CV LAB;  Service: Cardiovascular;  Laterality: N/A;  . IMPLANTABLE CARDIOVERTER DEFIBRILLATOR IMPLANT N/A 07/22/2012  Procedure: IMPLANTABLE CARDIOVERTER DEFIBRILLATOR IMPLANT;  Surgeon: Marinus Maw, MD;  Location: [facility] CATH LAB;  Service: Cardiovascular;  Laterality: N/A;  . LEFT HEART CATH AND CORONARY ANGIOGRAPHY N/A 08/24/2016   Procedure: LEFT HEART CATH AND CORONARY ANGIOGRAPHY;  Surgeon: Iran Ouch, MD;  Location: [facility] INVASIVE CV LAB;  Service: Cardiovascular;  Laterality: N/A;  . TEE WITHOUT CARDIOVERSION N/A 08/21/2014   Procedure: TRANSESOPHAGEAL ECHOCARDIOGRAM (TEE);  Surgeon: Chilton Si, MD;  Location: [facility] ENDOSCOPY;  Service: Cardiovascular;  Laterality: N/A;  . TUBAL LIGATION  ~ 1978    Current Outpatient Medications  Medication  Sig Dispense Refill  . amiodarone (PACERONE) 200 MG tablet TAKE 1 TABLET BY MOUTH ONCE DAILY 60 tablet 6  . apixaban (ELIQUIS) 5 MG TABS tablet Take 1 tablet (5 mg total) by mouth 2 (two) times daily. 60 tablet 11  . atorvastatin (LIPITOR) 40 MG tablet TAKE 1 TABLET BY MOUTH ONCE DAILY AT  6  PM 90 tablet 2  . carvedilol (COREG) 3.125 MG tablet TAKE 1 TABLET BY MOUTH TWICE DAILY WITH A MEAL 180 tablet 2  . clopidogrel (PLAVIX) 75 MG tablet TAKE 1 TABLET BY MOUTH ONCE DAILY WITH  BREAKFAST 90 tablet 2  . furosemide (LASIX) 40 MG tablet Take 1 tablet (40 mg total) by mouth daily. 90 tablet 2  . methimazole (TAPAZOLE) 10 MG tablet TAKE 1 TABLET BY MOUTH TWICE DAILY 180 tablet 3  . sacubitril-valsartan (ENTRESTO) 49-51 MG Take 1 tablet by mouth 2 (two) times daily. 60 tablet 5  . spironolactone (ALDACTONE) 25 MG tablet TAKE 1/2 (ONE-HALF) TABLET BY MOUTH ONCE DAILY 45 tablet 2   No current facility-administered medications for this visit.     No Known Allergies  Review of Systems negative except from HPI and PMH  Physical Exam BP 138/78 (BP Location: Left Arm, Patient Position: Sitting, Cuff Size: Normal)   Pulse 85   Ht 5' (1.524 m)   Wt 209 lb 4 oz (94.9 kg)   BMI 40.87 kg/m  Well developed and nourished in no acute distress HENT normal Neck supple with JVP-flat Clear Regular rate and rhythm, no murmurs or gallops Abd-soft with active BS No Clubbing cyanosis edema Skin-warm and dry A & Oriented  Grossly normal sensory and motor function   ECG Previous medications used:  Midodrine-n Florinef-n Droxydopa-n Mestinon-n Beta blockers-y Ivabradine-n Serotonin drugs-n Anticholinergics-n    ECG personally reviewed sinus 85 20/08/42 Nonspecific ST changes    Assessment and  Plan  Paroxysmal atrial fibrillation  Nonischemic cardiomyopathy  Coronary artery disease-2 vessel with recent stenting  Ventricular tachycardia-treated  Implantable  defibrillator-Medtronic  Treated hyperthyroidism  Congestive heart failure-acute/chronic-systolic   Without symptoms of ischemia  No intercurrent atrial fibrillation or flutter  On Anticoagulation;  No bleeding issues   Euvolemic continue current meds  Nonsustained VT present    Will check surveillance labs on amio and aldactone and CBC and will see her in 6 months  Need to assess thyroid status  We spent more than 50% of our >25 min visit in face to face counseling regarding the above

## 2017-12-07 NOTE — Patient Instructions (Addendum)
Medication Instructions:  - Your physician recommends that you continue on your current medications as directed. Please refer to the Current Medication list given to you today.  If you need a refill on your cardiac medications before your next appointment, please call your pharmacy.   Lab work: - Your physician recommends that you have lab work today: CMET/ CBC/ TSH  If you have labs (blood work) drawn today and your tests are completely normal, you will receive your results only by: Marland Kitchen MyChart Message (if you have MyChart) OR . A paper copy in the mail If you have any lab test that is abnormal or we need to change your treatment, we will call you to review the results.  Testing/Procedures: - none ordered   Follow-Up: At [facility], you and your health needs are our priority.  As part of our continuing mission to provide you with exceptional heart care, we have created designated Provider Care Teams.  These Care Teams include your primary Cardiologist (physician) and Advanced Practice Providers (APPs -  Physician Assistants and Nurse Practitioners) who all work together to provide you with the care you need, when you need it. . You will need a follow up appointment in 6 months with Dr. Graciela Husbands.  Please call our office 2 months in advance to schedule this appointment.    Remote monitoring is used to monitor your Pacemaker of ICD from home. This monitoring reduces the number of office visits required to check your device to one time per year. It allows [place] to keep an eye on the functioning of your device to ensure it is working properly. You are scheduled for a device check from home on 03/08/18. You may send your transmission at any time that day. If you have a wireless device, the transmission will be sent automatically. After your physician reviews your transmission, you will receive a postcard with your next transmission date.   Any Other Special Instructions Will Be Listed Below (If  Applicable). - N/A

## 2017-12-07 NOTE — Addendum Note (Signed)
Addended bySherri Rad C on: 12/07/2017 11:08 AM   Modules accepted: Orders

## 2017-12-07 NOTE — Patient Instructions (Signed)

## 2017-12-08 LAB — CBC WITH DIFFERENTIAL/PLATELET
Basophils Absolute: 0.1 10*3/uL (ref 0.0–0.2)
Basos: 1 %
EOS (ABSOLUTE): 0.3 10*3/uL (ref 0.0–0.4)
Eos: 4 %
HEMATOCRIT: 37.6 % (ref 34.0–46.6)
Hemoglobin: 11.3 g/dL (ref 11.1–15.9)
IMMATURE GRANS (ABS): 0 10*3/uL (ref 0.0–0.1)
Immature Granulocytes: 0 %
LYMPHS ABS: 1.8 10*3/uL (ref 0.7–3.1)
Lymphs: 30 %
MCH: 22.9 pg — ABNORMAL LOW (ref 26.6–33.0)
MCHC: 30.1 g/dL — AB (ref 31.5–35.7)
MCV: 76 fL — ABNORMAL LOW (ref 79–97)
MONOCYTES: 13 %
Monocytes Absolute: 0.8 10*3/uL (ref 0.1–0.9)
Neutrophils Absolute: 3 10*3/uL (ref 1.4–7.0)
Neutrophils: 52 %
Platelets: 409 10*3/uL (ref 150–450)
RBC: 4.94 x10E6/uL (ref 3.77–5.28)
RDW: 16 % — ABNORMAL HIGH (ref 12.3–15.4)
WBC: 5.9 10*3/uL (ref 3.4–10.8)

## 2017-12-08 LAB — COMPREHENSIVE METABOLIC PANEL
ALBUMIN: 4.6 g/dL (ref 3.5–4.8)
ALK PHOS: 82 IU/L (ref 39–117)
ALT: 57 IU/L — ABNORMAL HIGH (ref 0–32)
AST: 59 IU/L — ABNORMAL HIGH (ref 0–40)
Albumin/Globulin Ratio: 1.6 (ref 1.2–2.2)
BUN / CREAT RATIO: 20 (ref 12–28)
BUN: 23 mg/dL (ref 8–27)
Bilirubin Total: 0.9 mg/dL (ref 0.0–1.2)
CO2: 23 mmol/L (ref 20–29)
Calcium: 9.8 mg/dL (ref 8.7–10.3)
Chloride: 98 mmol/L (ref 96–106)
Creatinine, Ser: 1.14 mg/dL — ABNORMAL HIGH (ref 0.57–1.00)
GFR calc non Af Amer: 48 mL/min/{1.73_m2} — ABNORMAL LOW (ref >59)
GFR, EST AFRICAN AMERICAN: 56 mL/min/{1.73_m2} — AB (ref >59)
GLUCOSE: 94 mg/dL (ref 65–99)
Globulin, Total: 2.9 g/dL (ref 1.5–4.5)
Potassium: 5 mmol/L (ref 3.5–5.2)
Sodium: 141 mmol/L (ref 134–144)
TOTAL PROTEIN: 7.5 g/dL (ref 6.0–8.5)

## 2017-12-08 LAB — TSH: TSH: 26.33 u[IU]/mL — ABNORMAL HIGH (ref 0.450–4.500)

## 2017-12-10 ENCOUNTER — Telehealth: Payer: Self-pay | Admitting: Internal Medicine

## 2017-12-10 MED ORDER — AMIODARONE HCL 200 MG PO TABS: ORAL_TABLET | ORAL | Status: DC

## 2017-12-10 NOTE — Telephone Encounter (Signed)
Notes recorded by Bobbye Riggs, CMA on 12/09/2017 at 9:35 AM EST Scheduled patient for f/u on 12/14/17 ------  Notes recorded by Romero Belling, MD on 12/08/2017 at 6:46 PM EST please call patient: Please advise f/u ov next week. ------  Notes recorded by Duke Salvia, MD on 12/08/2017 at 4:35 PM EST Please Inform Patient That Hgb and Low MCV are stable  She is now significant hypothyroid so she should stop methimazole LFTs are also elevated  Lets decrease amio to 100 mg daily and recheck tsh and lfts in 4 weeks  Thanks

## 2017-12-10 NOTE — Telephone Encounter (Addendum)
I spoke with the patient and gave her detailed results of her CMET/ TSH/ CBC. She is aware of Dr. Odessa Fleming recommendations to:  1) Decrease amiodarone to 100 mg once daily 2) Stop methimazole 3) She will need a repeat TSH/ LFT's in ~ 4 weeks.  I have advised the patient that I see where Dr. George Hugh office contacted her and she is scheduled to see him on 12/14/17 based on her most recent lab results. She is aware that I will follow along to see what Dr. George Hugh plan is to repeat any of her lab work.  I have advised I will be back in touch with her if we still need to plan to repeat her labs in 4 weeks after she sees Dr. Everardo All.  The patient voices understanding of all of the above and voices understanding.

## 2017-12-14 ENCOUNTER — Ambulatory Visit (INDEPENDENT_AMBULATORY_CARE_PROVIDER_SITE_OTHER): Payer: Medicare Other | Admitting: Endocrinology

## 2017-12-14 ENCOUNTER — Encounter: Payer: Self-pay | Admitting: Endocrinology

## 2017-12-14 ENCOUNTER — Telehealth: Payer: Self-pay | Admitting: Internal Medicine

## 2017-12-14 VITALS — BP 122/74 | HR 85 | Ht 60.0 in | Wt 206.8 lb

## 2017-12-14 DIAGNOSIS — E0580 Other thyrotoxicosis without thyrotoxic crisis or storm: Secondary | ICD-10-CM | POA: Diagnosis not present

## 2017-12-14 NOTE — Patient Instructions (Addendum)
Please stay off the methimazole for now.   Please come back for a follow-up appointment in 4 weeks.  We may need to resume it at a lower amount.

## 2017-12-14 NOTE — Telephone Encounter (Signed)
Patient recently seen by Dr. Graciela Husbands on 12/07/17. She notified us she was in the donut hole and could not afford Entresto. We did not have any samples in the office at the time. I spoke with the [facility] rep today and was told that this late in the year, the patient would only be able to get samples or use the #30 day voucher if not used once already.  I have pulled Entresto samples for the patient and left a message on her cell #- (ok per DPR), that enough samples have been pulled to get her through the end of the year.  I advised she will just need to come to the office to pick these up at the front desk. I asked that she call back with any questions.    Samples given: Entresto 49/51 mg Lot: [id number] Exp: 4/22 # 2 bottles (28 tabs each)

## 2017-12-14 NOTE — Progress Notes (Signed)
Subjective:    Patient ID: Erin [name], female    DOB: [birth year], 72 y.o.   MRN: [id number]  HPI Pt returns for f/u of hyperthyroidism (dx'ed whlie on amiodarone--she has taken since 2014; in 2015, she had slightly elevated TSH; several measurements more recently were low; she has never had thyroid imaging; in Sept of 2016, tapazole was resumed in 2018, and was then stopped last week, due to hypothyroidism.  pt states she feels well in general.   Past Medical History:  Diagnosis Date  . Anginal pain (HCC)   . Automatic implantable cardioverter-defibrillator in situ    a. s/p MDT ICD 07/2012; b. SN # [id number]   . CAD (coronary artery disease)    a. cath 2003: LM nl, mid LAD 50%, LCx nl, RCA nl b. L&RHC 08/17/2014 100% occluded mid LCx, 70% mid LAD with FFR 0.82. Medical therapy. CI 2.1. CO 4.01c. LHC 8/18: CTO LCx w/ R-L colatts, LAD s/p PCI/DES x 2  . Chronic systolic CHF (congestive heart failure) (HCC)    a. echo 2014: EF 25%, diffuse HK, LA moderately dilated, RA mildly dilated, PASP 38 mm Hg; b. echo 08/2014: EF 25-30%, diffuse HK, RWMA cannot be excluded, mild MR, mild biatrial enlargement, RV mildly dilated, wall thickness nl, pacer wire/catheter noted, mild to mod TR, PASP high nl, c. TTE 8/18: EF 25-30%, diffuse HK, GR1DD, mod MR, mod dilated LA, nl RV sys fxn, PASP 56  . Encounter for long-term (current) use of anticoagulants   . Exertional shortness of breath   . H/O medication noncompliance   . High cholesterol   . Hypertension   . NICM (nonischemic cardiomyopathy) (HCC)    a. s/p AICD 07/22/2012  . Obesity   . OSA on CPAP    "suppose to; not often" (07/22/2012)  . Other "heavy-for-dates" infants   . PAF (paroxysmal atrial fibrillation) (HCC)    a. on warfarin--> Eliquis 08/2014    Past Surgical History:  Procedure Laterality Date  . ABDOMINAL HYSTERECTOMY  ~ 1998   "laparoscopic" (07/22/2012)  . CARDIAC CATHETERIZATION  ? 1990's  . CARDIAC CATHETERIZATION N/A  08/17/2014   Procedure: Right/Left Heart Cath and Coronary Angiography;  Surgeon: Kathleene Hazel, MD;  Location: [facility] INVASIVE CV LAB;  Service: Cardiovascular;  Laterality: N/A;  . CARDIAC DEFIBRILLATOR PLACEMENT  07/22/2012   dual-chamber ICD.  Marland Kitchen CORONARY STENT INTERVENTION N/A 08/24/2016   Procedure: CORONARY STENT INTERVENTION;  Surgeon: Iran Ouch, MD;  Location: [facility] INVASIVE CV LAB;  Service: Cardiovascular;  Laterality: N/A;  . IMPLANTABLE CARDIOVERTER DEFIBRILLATOR IMPLANT N/A 07/22/2012   Procedure: IMPLANTABLE CARDIOVERTER DEFIBRILLATOR IMPLANT;  Surgeon: Marinus Maw, MD;  Location: [facility] CATH LAB;  Service: Cardiovascular;  Laterality: N/A;  . LEFT HEART CATH AND CORONARY ANGIOGRAPHY N/A 08/24/2016   Procedure: LEFT HEART CATH AND CORONARY ANGIOGRAPHY;  Surgeon: Iran Ouch, MD;  Location: [facility] INVASIVE CV LAB;  Service: Cardiovascular;  Laterality: N/A;  . TEE WITHOUT CARDIOVERSION N/A 08/21/2014   Procedure: TRANSESOPHAGEAL ECHOCARDIOGRAM (TEE);  Surgeon: Chilton Si, MD;  Location: [facility] ENDOSCOPY;  Service: Cardiovascular;  Laterality: N/A;  . TUBAL LIGATION  ~ 1978    Social History   Socioeconomic History  . Marital status: Single    Spouse name: Not on file  . Number of children: 3  . Years of education: Not on file  . Highest education level: Not on file  Occupational History  . Occupation: Retired    Associate Professor: [employer]  Social  Needs  . Financial resource strain: Not on file  . Food insecurity:    Worry: Not on file    Inability: Not on file  . Transportation needs:    Medical: Not on file    Non-medical: Not on file  Tobacco Use  . Smoking status: Never Smoker  . Smokeless tobacco: Never Used  Substance and Sexual Activity  . Alcohol use: No  . Drug use: No  . Sexual activity: Never  Lifestyle  . Physical activity:    Days per week: Not on file    Minutes per session: Not on file  . Stress: Not on file  Relationships  . Social  connections:    Talks on phone: Not on file    Gets together: Not on file    Attends religious service: Not on file    Active member of club or organization: Not on file    Attends meetings of clubs or organizations: Not on file    Relationship status: Not on file  . Intimate partner violence:    Fear of current or ex partner: Not on file    Emotionally abused: Not on file    Physically abused: Not on file    Forced sexual activity: Not on file  Other Topics Concern  . Not on file  Social History Narrative  . Not on file    Current Outpatient Medications on File Prior to Visit  Medication Sig Dispense Refill  . amiodarone (PACERONE) 200 MG tablet Take 1/2 tablet (100 mg) by mouth once daily    . apixaban (ELIQUIS) 5 MG TABS tablet Take 1 tablet (5 mg total) by mouth 2 (two) times daily. 60 tablet 11  . atorvastatin (LIPITOR) 40 MG tablet TAKE 1 TABLET BY MOUTH ONCE DAILY AT  6  PM 90 tablet 2  . carvedilol (COREG) 3.125 MG tablet TAKE 1 TABLET BY MOUTH TWICE DAILY WITH A MEAL 180 tablet 2  . clopidogrel (PLAVIX) 75 MG tablet TAKE 1 TABLET BY MOUTH ONCE DAILY WITH  BREAKFAST 90 tablet 2  . furosemide (LASIX) 40 MG tablet Take 1 tablet (40 mg total) by mouth daily. 90 tablet 2  . sacubitril-valsartan (ENTRESTO) 49-51 MG Take 1 tablet by mouth 2 (two) times daily. 60 tablet 5  . spironolactone (ALDACTONE) 25 MG tablet TAKE 1/2 (ONE-HALF) TABLET BY MOUTH ONCE DAILY 45 tablet 2   No current facility-administered medications on file prior to visit.     No Known Allergies  Family History  Problem Relation Age of Onset  . Heart disease Mother   . Heart disease Father   . Lung disease Sister   . Diabetes Sister   . Heart failure Brother   . Thyroid disease Neg Hx     BP 122/74 (BP Location: Left Arm, Patient Position: Sitting, Cuff Size: Normal)   Pulse 85   Ht 5' (1.524 m)   Wt 206 lb 12.8 oz (93.8 kg)   SpO2 94%   BMI 40.39 kg/m    Review of Systems Denies fever.        Objective:   Physical Exam VITAL SIGNS:  See vs page.   GENERAL: no distress.  NECK: thyroid is slightly enlarged on the right, and normal on the left.  No palpable nodule.     Lab Results  Component Value Date   TSH 26.330 (H) 12/07/2017       Assessment & Plan:  Hyperthyroidism: overcontrolled.    Patient Instructions  Please stay  off the methimazole for now.   Please come back for a follow-up appointment in 4 weeks.  We may need to resume it at a lower amount.

## 2017-12-15 NOTE — Telephone Encounter (Signed)
Pt call patient her Erin [name]  Patient would not give any detailed information.

## 2017-12-15 NOTE — Telephone Encounter (Signed)
I spoke with the patient. She wanted to thank me for the samples of entresto.  She was asking for samples of eliquis as she is in the donut hole with this as well.   She is aware I do have eliquis samples and will put this in with her entresto samples.  Samples given: Eliquis 5 mg Lot: [id number] Exp: 6/22 #4 boxes given.

## 2018-01-11 ENCOUNTER — Encounter: Payer: Self-pay | Admitting: Endocrinology

## 2018-01-11 ENCOUNTER — Ambulatory Visit (INDEPENDENT_AMBULATORY_CARE_PROVIDER_SITE_OTHER): Payer: Medicare Other | Admitting: Endocrinology

## 2018-01-11 VITALS — BP 128/68 | HR 80 | Ht 60.0 in | Wt 213.6 lb

## 2018-01-11 DIAGNOSIS — E0580 Other thyrotoxicosis without thyrotoxic crisis or storm: Secondary | ICD-10-CM

## 2018-01-11 DIAGNOSIS — T462X5A Adverse effect of other antidysrhythmic drugs, initial encounter: Secondary | ICD-10-CM

## 2018-01-11 LAB — T4, FREE: Free T4: 0.65 ng/dL (ref 0.60–1.60)

## 2018-01-11 LAB — TSH: TSH: 34.04 u[IU]/mL — ABNORMAL HIGH (ref 0.35–4.50)

## 2018-01-11 MED ORDER — LEVOTHYROXINE SODIUM 75 MCG PO TABS: 75.0000 ug | ORAL_TABLET | Freq: Every day | ORAL | 11 refills | Status: DC

## 2018-01-11 NOTE — Progress Notes (Signed)
Subjective:    Patient ID: Erin [name], female    DOB: 03/09/1945, 73 y.o.   MRN: [id number]  HPI Pt returns for f/u of hyperthyroidism (dx'ed whlie on amiodarone--she has taken since 2014; in 2015, she had slightly elevated TSH; several measurements more recently were low; she has never had thyroid imaging; tapazole was resumed in 2018, but then stopped in late 2019, due to hypothyroidism).  pt states she feels well in general.  Past Medical History:  Diagnosis Date  . Anginal pain (HCC)   . Automatic implantable cardioverter-defibrillator in situ    a. s/p MDT ICD 07/2012; b. SN # [id number]   . CAD (coronary artery disease)    a. cath 2003: LM nl, mid LAD 50%, LCx nl, RCA nl b. L&RHC 08/17/2014 100% occluded mid LCx, 70% mid LAD with FFR 0.82. Medical therapy. CI 2.1. CO 4.01c. LHC 8/18: CTO LCx w/ R-L colatts, LAD s/p PCI/DES x 2  . Chronic systolic CHF (congestive heart failure) (HCC)    a. echo 2014: EF 25%, diffuse HK, LA moderately dilated, RA mildly dilated, PASP 38 mm Hg; b. echo 08/2014: EF 25-30%, diffuse HK, RWMA cannot be excluded, mild MR, mild biatrial enlargement, RV mildly dilated, wall thickness nl, pacer wire/catheter noted, mild to mod TR, PASP high nl, c. TTE 8/18: EF 25-30%, diffuse HK, GR1DD, mod MR, mod dilated LA, nl RV sys fxn, PASP 56  . Encounter for long-term (current) use of anticoagulants   . Exertional shortness of breath   . H/O medication noncompliance   . High cholesterol   . Hypertension   . NICM (nonischemic cardiomyopathy) (HCC)    a. s/p AICD 07/22/2012  . Obesity   . OSA on CPAP    "suppose to; not often" (07/22/2012)  . Other "heavy-for-dates" infants   . PAF (paroxysmal atrial fibrillation) (HCC)    a. on warfarin--> Eliquis 08/2014    Past Surgical History:  Procedure Laterality Date  . ABDOMINAL HYSTERECTOMY  ~ 1998   "laparoscopic" (07/22/2012)  . CARDIAC CATHETERIZATION  ? 1990's  . CARDIAC CATHETERIZATION N/A 08/17/2014   Procedure:  Right/Left Heart Cath and Coronary Angiography;  Surgeon: Kathleene Hazel, MD;  Location: [facility] INVASIVE CV LAB;  Service: Cardiovascular;  Laterality: N/A;  . CARDIAC DEFIBRILLATOR PLACEMENT  07/22/2012   dual-chamber ICD.  Marland Kitchen CORONARY STENT INTERVENTION N/A 08/24/2016   Procedure: CORONARY STENT INTERVENTION;  Surgeon: Iran Ouch, MD;  Location: [facility] INVASIVE CV LAB;  Service: Cardiovascular;  Laterality: N/A;  . IMPLANTABLE CARDIOVERTER DEFIBRILLATOR IMPLANT N/A 07/22/2012   Procedure: IMPLANTABLE CARDIOVERTER DEFIBRILLATOR IMPLANT;  Surgeon: Marinus Maw, MD;  Location: [facility] CATH LAB;  Service: Cardiovascular;  Laterality: N/A;  . LEFT HEART CATH AND CORONARY ANGIOGRAPHY N/A 08/24/2016   Procedure: LEFT HEART CATH AND CORONARY ANGIOGRAPHY;  Surgeon: Iran Ouch, MD;  Location: [facility] INVASIVE CV LAB;  Service: Cardiovascular;  Laterality: N/A;  . TEE WITHOUT CARDIOVERSION N/A 08/21/2014   Procedure: TRANSESOPHAGEAL ECHOCARDIOGRAM (TEE);  Surgeon: Chilton Si, MD;  Location: [facility] ENDOSCOPY;  Service: Cardiovascular;  Laterality: N/A;  . TUBAL LIGATION  ~ 1978    Social History   Socioeconomic History  . Marital status: Single    Spouse name: Not on file  . Number of children: 3  . Years of education: Not on file  . Highest education level: Not on file  Occupational History  . Occupation: Retired    Associate Professor: Therapist, art  Social Needs  . Physicist, medical  strain: Not on file  . Food insecurity:    Worry: Not on file    Inability: Not on file  . Transportation needs:    Medical: Not on file    Non-medical: Not on file  Tobacco Use  . Smoking status: Never Smoker  . Smokeless tobacco: Never Used  Substance and Sexual Activity  . Alcohol use: No  . Drug use: No  . Sexual activity: Never  Lifestyle  . Physical activity:    Days per week: Not on file    Minutes per session: Not on file  . Stress: Not on file  Relationships  . Social connections:    Talks on  phone: Not on file    Gets together: Not on file    Attends religious service: Not on file    Active member of club or organization: Not on file    Attends meetings of clubs or organizations: Not on file    Relationship status: Not on file  . Intimate partner violence:    Fear of current or ex partner: Not on file    Emotionally abused: Not on file    Physically abused: Not on file    Forced sexual activity: Not on file  Other Topics Concern  . Not on file  Social History Narrative  . Not on file    Current Outpatient Medications on File Prior to Visit  Medication Sig Dispense Refill  . amiodarone (PACERONE) 200 MG tablet Take 1/2 tablet (100 mg) by mouth once daily    . apixaban (ELIQUIS) 5 MG TABS tablet Take 1 tablet (5 mg total) by mouth 2 (two) times daily. 60 tablet 11  . atorvastatin (LIPITOR) 40 MG tablet TAKE 1 TABLET BY MOUTH ONCE DAILY AT  6  PM 90 tablet 2  . carvedilol (COREG) 3.125 MG tablet TAKE 1 TABLET BY MOUTH TWICE DAILY WITH A MEAL 180 tablet 2  . clopidogrel (PLAVIX) 75 MG tablet TAKE 1 TABLET BY MOUTH ONCE DAILY WITH  BREAKFAST 90 tablet 2  . furosemide (LASIX) 40 MG tablet Take 1 tablet (40 mg total) by mouth daily. 90 tablet 2  . sacubitril-valsartan (ENTRESTO) 49-51 MG Take 1 tablet by mouth 2 (two) times daily. 60 tablet 5  . spironolactone (ALDACTONE) 25 MG tablet TAKE 1/2 (ONE-HALF) TABLET BY MOUTH ONCE DAILY 45 tablet 2   No current facility-administered medications on file prior to visit.     No Known Allergies  Family History  Problem Relation Age of Onset  . Heart disease Mother   . Heart disease Father   . Lung disease Sister   . Diabetes Sister   . Heart failure Brother   . Thyroid disease Neg Hx     BP 128/68 (BP Location: Left Arm, Patient Position: Sitting, Cuff Size: Normal)   Pulse 80   Ht 5' (1.524 m)   Wt 213 lb 9.6 oz (96.9 kg)   SpO2 99%   BMI 41.72 kg/m    Review of Systems Denies palpitations/tremor/fever      Objective:   Physical Exam VITAL SIGNS:  See vs page.   GENERAL: no distress.  NECK: thyroid is slightly enlarged on the right, and normal on the left.  No palpable nodule.     Lab Results  Component Value Date   TSH 34.04 (H) 01/11/2018      Assessment & Plan:  Hypothyroidism: worse despite discontinuation of tapazole.   Patient Instructions  blood tests are requested for you today.  We'll let you know about the results.   Please come back for a follow-up appointment in 4 weeks.

## 2018-01-11 NOTE — Patient Instructions (Addendum)
blood tests are requested for you today.  We'll let you know about the results.   Please come back for a follow-up appointment in 4 weeks.

## 2018-02-08 ENCOUNTER — Ambulatory Visit: Payer: Medicare Other | Admitting: Endocrinology

## 2018-02-15 ENCOUNTER — Ambulatory Visit (INDEPENDENT_AMBULATORY_CARE_PROVIDER_SITE_OTHER): Payer: Medicare Other | Admitting: Endocrinology

## 2018-02-15 ENCOUNTER — Encounter: Payer: Self-pay | Admitting: Endocrinology

## 2018-02-15 DIAGNOSIS — T462X1A Poisoning by other antidysrhythmic drugs, accidental (unintentional), initial encounter: Secondary | ICD-10-CM | POA: Diagnosis not present

## 2018-02-15 DIAGNOSIS — E032 Hypothyroidism due to medicaments and other exogenous substances: Secondary | ICD-10-CM

## 2018-02-15 LAB — TSH: TSH: 6.06 u[IU]/mL — ABNORMAL HIGH (ref 0.35–4.50)

## 2018-02-15 LAB — T4, FREE: Free T4: 1.01 ng/dL (ref 0.60–1.60)

## 2018-02-15 MED ORDER — LEVOTHYROXINE SODIUM 88 MCG PO TABS: 88.0000 ug | ORAL_TABLET | Freq: Every day | ORAL | 1 refills | Status: DC

## 2018-02-15 NOTE — Progress Notes (Signed)
Subjective:    Patient ID: Erin [name], female    DOB: Aug 22, 1945, 73 y.o.   MRN: [id number]  HPI Pt returns for f/u of abnormal thyroid function (was dx'ed whlie on amiodarone--she has taken since 2014; in 2015, she had slightly elevated TSH; several measurements more recently were low; she has never had thyroid imaging; tapazole was resumed in 2018, but then stopped in late 2019, due to hypothyroidism; synthroid was started in early 2020).  pt states she feels no different, and well in general.    Past Medical History:  Diagnosis Date  . Anginal pain (HCC)   . Automatic implantable cardioverter-defibrillator in situ    a. s/p MDT ICD 07/2012; b. SN # [id number]   . CAD (coronary artery disease)    a. cath 2003: LM nl, mid LAD 50%, LCx nl, RCA nl b. L&RHC 08/17/2014 100% occluded mid LCx, 70% mid LAD with FFR 0.82. Medical therapy. CI 2.1. CO 4.01c. LHC 8/18: CTO LCx w/ R-L colatts, LAD s/p PCI/DES x 2  . Chronic systolic CHF (congestive heart failure) (HCC)    a. echo 2014: EF 25%, diffuse HK, LA moderately dilated, RA mildly dilated, PASP 38 mm Hg; b. echo 08/2014: EF 25-30%, diffuse HK, RWMA cannot be excluded, mild MR, mild biatrial enlargement, RV mildly dilated, wall thickness nl, pacer wire/catheter noted, mild to mod TR, PASP high nl, c. TTE 8/18: EF 25-30%, diffuse HK, GR1DD, mod MR, mod dilated LA, nl RV sys fxn, PASP 56  . Encounter for long-term (current) use of anticoagulants   . Exertional shortness of breath   . H/O medication noncompliance   . High cholesterol   . Hypertension   . NICM (nonischemic cardiomyopathy) (HCC)    a. s/p AICD 07/22/2012  . Obesity   . OSA on CPAP    "suppose to; not often" (07/22/2012)  . Other "heavy-for-dates" infants   . PAF (paroxysmal atrial fibrillation) (HCC)    a. on warfarin--> Eliquis 08/2014    Past Surgical History:  Procedure Laterality Date  . ABDOMINAL HYSTERECTOMY  ~ 1998   "laparoscopic" (07/22/2012)  . CARDIAC  CATHETERIZATION  ? 1990's  . CARDIAC CATHETERIZATION N/A 08/17/2014   Procedure: Right/Left Heart Cath and Coronary Angiography;  Surgeon: Kathleene Hazel, MD;  Location: [facility] INVASIVE CV LAB;  Service: Cardiovascular;  Laterality: N/A;  . CARDIAC DEFIBRILLATOR PLACEMENT  07/22/2012   dual-chamber ICD.  Marland Kitchen CORONARY STENT INTERVENTION N/A 08/24/2016   Procedure: CORONARY STENT INTERVENTION;  Surgeon: Iran Ouch, MD;  Location: [facility] INVASIVE CV LAB;  Service: Cardiovascular;  Laterality: N/A;  . IMPLANTABLE CARDIOVERTER DEFIBRILLATOR IMPLANT N/A 07/22/2012   Procedure: IMPLANTABLE CARDIOVERTER DEFIBRILLATOR IMPLANT;  Surgeon: Marinus Maw, MD;  Location: [facility] CATH LAB;  Service: Cardiovascular;  Laterality: N/A;  . LEFT HEART CATH AND CORONARY ANGIOGRAPHY N/A 08/24/2016   Procedure: LEFT HEART CATH AND CORONARY ANGIOGRAPHY;  Surgeon: Iran Ouch, MD;  Location: [facility] INVASIVE CV LAB;  Service: Cardiovascular;  Laterality: N/A;  . TEE WITHOUT CARDIOVERSION N/A 08/21/2014   Procedure: TRANSESOPHAGEAL ECHOCARDIOGRAM (TEE);  Surgeon: Chilton Si, MD;  Location: [facility] ENDOSCOPY;  Service: Cardiovascular;  Laterality: N/A;  . TUBAL LIGATION  ~ 1978    Social History   Socioeconomic History  . Marital status: Single    Spouse name: Not on file  . Number of children: 3  . Years of education: Not on file  . Highest education level: Not on file  Occupational History  . Occupation:  Retired    Associate Professormployer: [employer]  Social Needs  . Financial resource strain: Not on file  . Food insecurity:    Worry: Not on file    Inability: Not on file  . Transportation needs:    Medical: Not on file    Non-medical: Not on file  Tobacco Use  . Smoking status: Never Smoker  . Smokeless tobacco: Never Used  Substance and Sexual Activity  . Alcohol use: No  . Drug use: No  . Sexual activity: Never  Lifestyle  . Physical activity:    Days per week: Not on file    Minutes per session: Not on  file  . Stress: Not on file  Relationships  . Social connections:    Talks on phone: Not on file    Gets together: Not on file    Attends religious service: Not on file    Active member of club or organization: Not on file    Attends meetings of clubs or organizations: Not on file    Relationship status: Not on file  . Intimate partner violence:    Fear of current or ex partner: Not on file    Emotionally abused: Not on file    Physically abused: Not on file    Forced sexual activity: Not on file  Other Topics Concern  . Not on file  Social History Narrative  . Not on file    Current Outpatient Medications on File Prior to Visit  Medication Sig Dispense Refill  . amiodarone (PACERONE) 200 MG tablet Take 1/2 tablet (100 mg) by mouth once daily    . apixaban (ELIQUIS) 5 MG TABS tablet Take 1 tablet (5 mg total) by mouth 2 (two) times daily. 60 tablet 11  . atorvastatin (LIPITOR) 40 MG tablet TAKE 1 TABLET BY MOUTH ONCE DAILY AT  6  PM 90 tablet 2  . carvedilol (COREG) 3.125 MG tablet TAKE 1 TABLET BY MOUTH TWICE DAILY WITH A MEAL 180 tablet 2  . clopidogrel (PLAVIX) 75 MG tablet TAKE 1 TABLET BY MOUTH ONCE DAILY WITH  BREAKFAST 90 tablet 2  . furosemide (LASIX) 40 MG tablet Take 1 tablet (40 mg total) by mouth daily. 90 tablet 2  . sacubitril-valsartan (ENTRESTO) 49-51 MG Take 1 tablet by mouth 2 (two) times daily. 60 tablet 5  . spironolactone (ALDACTONE) 25 MG tablet TAKE 1/2 (ONE-HALF) TABLET BY MOUTH ONCE DAILY 45 tablet 2   No current facility-administered medications on file prior to visit.     No Known Allergies  Family History  Problem Relation Age of Onset  . Heart disease Mother   . Heart disease Father   . Lung disease Sister   . Diabetes Sister   . Heart failure Brother   . Thyroid disease Neg Hx     BP 120/60 (BP Location: Left Arm, Patient Position: Sitting, Cuff Size: Normal)   Pulse 82   Ht 5' (1.524 m)   Wt 209 lb 12.8 oz (95.2 kg)   SpO2 92%   BMI  40.97 kg/m    Review of Systems Denies palpitations and tremor    Objective:   Physical Exam VITAL SIGNS:  See vs page GENERAL: no distress NECK: There is no palpable thyroid enlargement.  No thyroid nodule is palpable.  No palpable lymphadenopathy at the anterior neck.  Lab Results  Component Value Date   TSH 6.06 (H) 02/15/2018      Assessment & Plan:  Hypothyroidism: I have sent a  prescription to your pharmacy, to increase synthroid Please come back for a follow-up appointment in 6 weeks

## 2018-02-15 NOTE — Patient Instructions (Addendum)
blood tests are requested for you today.  We'll let you know about the results.  Please come back for a follow-up appointment in 6 weeks.  

## 2018-02-22 LAB — CUP PACEART INCLINIC DEVICE CHECK
Battery Remaining Longevity: 56 mo
Battery Voltage: 2.97 V
Brady Statistic AP VP Percent: 0 %
Brady Statistic AS VP Percent: 0.03 %
Brady Statistic AS VS Percent: 99.96 %
Brady Statistic RA Percent Paced: 0.01 %
Date Time Interrogation Session: 20191203162358
HighPow Impedance: 70 Ohm
Implantable Lead Implant Date: 20140718
Implantable Lead Location: 753859
Implantable Lead Location: 753860
Implantable Lead Model: 5076
Implantable Lead Model: 6935
Implantable Pulse Generator Implant Date: 20140718
Lead Channel Impedance Value: 304 Ohm
Lead Channel Impedance Value: 342 Ohm
Lead Channel Pacing Threshold Amplitude: 0.5 V
Lead Channel Pacing Threshold Amplitude: 0.75 V
Lead Channel Pacing Threshold Pulse Width: 0.4 ms
Lead Channel Pacing Threshold Pulse Width: 0.4 ms
Lead Channel Sensing Intrinsic Amplitude: 11.25 mV
Lead Channel Sensing Intrinsic Amplitude: 3.25 mV
Lead Channel Setting Pacing Amplitude: 2 V
Lead Channel Setting Pacing Amplitude: 2.5 V
Lead Channel Setting Pacing Pulse Width: 0.4 ms
Lead Channel Setting Sensing Sensitivity: 0.3 mV
MDC IDC LEAD IMPLANT DT: 20140718
MDC IDC MSMT LEADCHNL RA IMPEDANCE VALUE: 437 Ohm
MDC IDC STAT BRADY AP VS PERCENT: 0.01 %
MDC IDC STAT BRADY RV PERCENT PACED: 0.03 %

## 2018-03-08 ENCOUNTER — Ambulatory Visit (INDEPENDENT_AMBULATORY_CARE_PROVIDER_SITE_OTHER): Payer: Medicare Other | Admitting: *Deleted

## 2018-03-08 DIAGNOSIS — I428 Other cardiomyopathies: Secondary | ICD-10-CM

## 2018-03-08 DIAGNOSIS — I5022 Chronic systolic (congestive) heart failure: Secondary | ICD-10-CM

## 2018-03-09 LAB — CUP PACEART REMOTE DEVICE CHECK
Battery Remaining Longevity: 57 mo
Battery Voltage: 2.97 V
Brady Statistic AP VP Percent: 0 %
Brady Statistic AP VS Percent: 0.01 %
Brady Statistic AS VP Percent: 0.03 %
Brady Statistic AS VS Percent: 99.96 %
Brady Statistic RV Percent Paced: 0.03 %
Date Time Interrogation Session: 20200303170014
HighPow Impedance: 67 Ohm
Implantable Lead Implant Date: 20140718
Implantable Lead Implant Date: 20140718
Implantable Lead Location: 753859
Implantable Lead Location: 753860
Implantable Lead Model: 6935
Implantable Pulse Generator Implant Date: 20140718
Lead Channel Impedance Value: 304 Ohm
Lead Channel Impedance Value: 342 Ohm
Lead Channel Impedance Value: 399 Ohm
Lead Channel Pacing Threshold Amplitude: 0.625 V
Lead Channel Pacing Threshold Amplitude: 0.625 V
Lead Channel Pacing Threshold Pulse Width: 0.4 ms
Lead Channel Pacing Threshold Pulse Width: 0.4 ms
Lead Channel Sensing Intrinsic Amplitude: 3.125 mV
Lead Channel Sensing Intrinsic Amplitude: 9.5 mV
Lead Channel Sensing Intrinsic Amplitude: 9.5 mV
Lead Channel Setting Pacing Amplitude: 2 V
Lead Channel Setting Pacing Amplitude: 2.5 V
Lead Channel Setting Pacing Pulse Width: 0.4 ms
Lead Channel Setting Sensing Sensitivity: 0.3 mV
MDC IDC MSMT LEADCHNL RA SENSING INTR AMPL: 3.125 mV
MDC IDC STAT BRADY RA PERCENT PACED: 0.01 %

## 2018-03-16 NOTE — Progress Notes (Signed)
Remote ICD transmission.   

## 2018-03-21 ENCOUNTER — Other Ambulatory Visit: Payer: Self-pay | Admitting: Internal Medicine

## 2018-03-31 ENCOUNTER — Ambulatory Visit: Payer: Medicare Other | Admitting: Endocrinology

## 2018-04-01 ENCOUNTER — Other Ambulatory Visit: Payer: Self-pay | Admitting: Internal Medicine

## 2018-04-23 ENCOUNTER — Telehealth: Payer: Self-pay | Admitting: Cardiovascular Disease

## 2018-04-23 NOTE — Telephone Encounter (Signed)
Patient called regarding fluid building up in her legs and orthopnea.  She has been sleeping in a recliner due to orthopnea.  Scale is broken.  She tried taking an extra lasix a couple times but didn't have much improvement.  Symptoms have been ongoing for over a week.  We will increase lasix to 80mg  bid x3 days followed by 40mg  bid.  Will contact her primary cardiologist and likely video visit vs. In office visit next week.  If symptoms progress she will go to the ED.  Time spent: 6 minutes-Greater than 50% of this time was spent in counseling, explanation of diagnosis, planning of further management, and coordination of care.   Roald Lukacs C. Duke Salvia, MD, [facility] 04/23/2018 9:42 AM

## 2018-04-25 ENCOUNTER — Telehealth (INDEPENDENT_AMBULATORY_CARE_PROVIDER_SITE_OTHER): Payer: Medicare Other | Admitting: Cardiovascular Disease

## 2018-04-25 ENCOUNTER — Other Ambulatory Visit: Payer: Self-pay

## 2018-04-25 ENCOUNTER — Telehealth: Payer: Self-pay

## 2018-04-25 DIAGNOSIS — I4891 Unspecified atrial fibrillation: Secondary | ICD-10-CM | POA: Diagnosis not present

## 2018-04-25 DIAGNOSIS — I5022 Chronic systolic (congestive) heart failure: Secondary | ICD-10-CM | POA: Diagnosis not present

## 2018-04-25 DIAGNOSIS — G4731 Primary central sleep apnea: Secondary | ICD-10-CM

## 2018-04-25 DIAGNOSIS — N183 Chronic kidney disease, stage 3 unspecified: Secondary | ICD-10-CM

## 2018-04-25 DIAGNOSIS — I272 Pulmonary hypertension, unspecified: Secondary | ICD-10-CM

## 2018-04-25 DIAGNOSIS — Z95810 Presence of automatic (implantable) cardiac defibrillator: Secondary | ICD-10-CM | POA: Diagnosis not present

## 2018-04-25 DIAGNOSIS — I25118 Atherosclerotic heart disease of native coronary artery with other forms of angina pectoris: Secondary | ICD-10-CM

## 2018-04-25 DIAGNOSIS — I428 Other cardiomyopathies: Secondary | ICD-10-CM

## 2018-04-25 DIAGNOSIS — I10 Essential (primary) hypertension: Secondary | ICD-10-CM

## 2018-04-25 MED ORDER — TORSEMIDE 20 MG PO TABS: ORAL_TABLET | ORAL | 3 refills | Status: DC

## 2018-04-25 MED ORDER — LOSARTAN POTASSIUM 100 MG PO TABS: 100.0000 mg | ORAL_TABLET | Freq: Every day | ORAL | 3 refills | Status: DC

## 2018-04-25 NOTE — Telephone Encounter (Signed)
Virtual Visit Pre-Appointment Phone Call  Steps For Call:  1. Confirm consent - "In the setting of the current Covid19 crisis, you are scheduled for a VIDEO visit with your provider on 04/25/2018 at 11:00.  Just as we do with many in-office visits, in order for you to participate in this visit, we must obtain consent.  If you'd like, I can send this to your mychart (if signed up) or email for you to review.  Otherwise, I can obtain your verbal consent now.  All virtual visits are billed to your insurance company just like a normal visit would be.  By agreeing to a virtual visit, we'd like you to understand that the technology does not allow for your provider to perform an examination, and thus may limit your provider's ability to fully assess your condition. If your provider identifies any concerns that need to be evaluated in person, we will make arrangements to do so.  Finally, though the technology is pretty good, we cannot assure that it will always work on either your or our end, and in the setting of a video visit, we may have to convert it to a phone-only visit.  In either situation, we cannot ensure that we have a secure connection.  Are you willing to proceed?" STAFF: Did the patient verbally acknowledge consent to telehealth visit? Document YES/NO here: YES  2. Confirm the BEST phone number to call the day of the visit by including in appointment notes  3. Give patient instructions for WebEx/MyChart download to smartphone as below or Doximity/Doxy.me if video visit (depending on what platform provider is using)  4. Advise patient to be prepared with their blood pressure, heart rate, weight, any heart rhythm information, their current medicines, and a piece of paper and pen handy for any instructions they may receive the day of their visit  5. Inform patient they will receive a phone call 15 minutes prior to their appointment time (may be from unknown caller ID) so they should be prepared  to answer  6. Confirm that appointment type is correct in Epic appointment notes (VIDEO vs PHONE)     TELEPHONE CALL NOTE  Erin [name] has been deemed a candidate for a follow-up tele-health visit to limit community exposure during the Covid-19 pandemic. I spoke with the patient via phone to ensure availability of phone/video source, confirm preferred email & phone number, and discuss instructions and expectations.  I reminded Erin [name] to be prepared with any vital sign and/or heart rhythm information that could potentially be obtained via home monitoring, at the time of her visit. I reminded Erin [name] to expect a phone call at the time of her visit if her visit.  Erin [name], [place] 04/25/2018 9:24 AM   INSTRUCTIONS FOR DOWNLOADING THE WEBEX APP TO SMARTPHONE  - If Apple, ask patient to go to [employer] and type in WebEx in the search bar. Download [employer], the blue/green circle. If Android, go to [employer] and type in [employer] in the search bar. The app is free but as with any other app downloads, their phone may require them to verify saved payment information or Apple/Android password.  - The patient does NOT have to create an account. - On the day of the visit, the assist will walk the patient through joining the meeting with the meeting number/password.  INSTRUCTIONS FOR DOWNLOADING THE MYCHART APP TO SMARTPHONE  - The patient must first make sure  to have activated MyChart and know their login information - If Apple, go to [employer] and type in MyChart in the search bar and download the app. If Android, ask patient to go to [employer] and type in [place] in the search bar and download the app. The app is free but as with any other app downloads, their phone may require them to verify saved payment information or Apple/Android password.  - The patient will need to then log into the app with their MyChart username and password, and select  [facility] as their healthcare provider to link the account. When it is time for your visit, go to the MyChart app, find appointments, and click Begin Video Visit. Be sure to Select Allow for your device to access the Microphone and Camera for your visit. You will then be connected, and your provider will be with you shortly.  **If they have any issues connecting, or need assistance please contact MyChart service desk [phone])**  **If using a computer, in order to ensure the best quality for their visit they will need to use either of the following Internet Browsers: [employer], or Google Chrome**  IF USING DOXIMITY or DOXY.ME - The patient will receive a link just prior to their visit, either by text or email (to be determined day of appointment depending on if it's doxy.me or Doximity).     FULL LENGTH CONSENT FOR TELE-HEALTH VISIT   I hereby voluntarily request, consent and authorize [facility] and its employed or contracted physicians, physician assistants, nurse practitioners or other licensed health care professionals (the Practitioner), to provide me with telemedicine health care services (the "Services") as deemed necessary by the treating Practitioner. I acknowledge and consent to receive the Services by the Practitioner via telemedicine. I understand that the telemedicine visit will involve communicating with the Practitioner through live audiovisual communication technology and the disclosure of certain medical information by electronic transmission. I acknowledge that I have been given the opportunity to request an in-person assessment or other available alternative prior to the telemedicine visit and am voluntarily participating in the telemedicine visit.  I understand that I have the right to withhold or withdraw my consent to the use of telemedicine in the course of my care at any time, without affecting my right to future care or treatment, and that the  Practitioner or I may terminate the telemedicine visit at any time. I understand that I have the right to inspect all information obtained and/or recorded in the course of the telemedicine visit and may receive copies of available information for a reasonable fee.  I understand that some of the potential risks of receiving the Services via telemedicine include:  Marland Kitchen Delay or interruption in medical evaluation due to technological equipment failure or disruption; . Information transmitted may not be sufficient (e.g. poor resolution of images) to allow for appropriate medical decision making by the Practitioner; and/or  . In rare instances, security protocols could fail, causing a breach of personal health information.  Furthermore, I acknowledge that it is my responsibility to provide information about my medical history, conditions and care that is complete and accurate to the best of my ability. I acknowledge that Practitioner's advice, recommendations, and/or decision may be based on factors not within their control, such as incomplete or inaccurate data provided by me or distortions of diagnostic images or specimens that may result from electronic transmissions. I understand that the practice of medicine is not an exact science  and that Practitioner makes no warranties or guarantees regarding treatment outcomes. I acknowledge that I will receive a copy of this consent concurrently upon execution via email to the email address I last provided but may also request a printed copy by calling the office of [place].    I understand that my insurance will be billed for this visit.   I have read or had this consent read to me. . I understand the contents of this consent, which adequately explains the benefits and risks of the Services being provided via telemedicine.  . I have been provided ample opportunity to ask questions regarding this consent and the Services and have had my questions answered to my  satisfaction. . I give my informed consent for the services to be provided through the use of telemedicine in my medical care  By participating in this telemedicine visit I agree to the above.

## 2018-04-25 NOTE — Progress Notes (Signed)
Virtual Visit via Video Note   This visit type was conducted due to national recommendations for restrictions regarding the COVID-19 Pandemic (e.g. social distancing) in an effort to limit this patient's exposure and mitigate transmission in our community.  Due to her co-morbid illnesses, this patient is at least at moderate risk for complications without adequate follow up.  This format is felt to be most appropriate for this patient at this time.  All issues noted in this document were discussed and addressed.  A limited physical exam was performed with this format.  Please refer to the patient's chart for her consent to telehealth for [facility].   I connected with  [name] on 04/25/18 by a video enabled telemedicine application and verified that I am speaking with the correct person using two identifiers. I discussed the limitations of evaluation and management by telemedicine. The patient expressed understanding and agreed to proceed.   Evaluation Performed:  Follow-up visit  Date:  04/25/2018   ID:  Erin [name], DOB 02/04/1945, MRN [id number]  Patient Location:  [id number] [name] [address]   Provider location:   [facility], [place] office  PCP:  Patient, No Pcp Per  Cardiologist:  Fonnie Mu   Chief Complaint:  Leg swelling    History of Present Illness:    Erin [name] is a 73 y.o. female who presents via audio/video conferencing for a telehealth visit today.   The patient does not symptoms concerning for COVID-19 infection (fever, chills, cough, or new SHORTNESS OF BREATH).   Patient has a past medical history of coronary artery disease,  paroxysmal atrial fibrillation,  chronic systolic CHF   Medtronic ICD placed July 2014 lost to follow-up for the past 2 years, Recently ran out of her medications who presented to [facility] 08/20/2016  after episode of weakness, ICD shock while she was at the supermarket Ejection  fraction 25 to 30% in November 2018 Right heart pressures of 70 at that time PCI of mid LAD disease, 2 drug-eluting stents. 08/2016 occluded left circumflex with right-to-left collaterals.  She presents for follow-up of her cardiomyopathy, sustained VT   Recent call to oncall cardiology 04/23/2018 Patient called regarding fluid building up in her legs and orthopnea.  She has been sleeping in a recliner due to orthopnea.  Scale is broken.  She tried taking an extra lasix a couple times but didn't have much improvement.  Symptoms have been ongoing for over a week.  We will increase lasix to 80mg  bid x3 days followed by 40mg  bid.   Usually takes lasix 40 Increase up to 80 BID over the weekend Weight on sat 225 Sunday: 221 Today 220, legs tight, stomach sore, tight Little SOB, coughing flem  Baseline 206 to 209  Episode of VT on December 07, 2017 Paced terminated  Out of eliquis Out of entresto These medications are too expensive for her She has not been picking them up secondary to price, hit the donut hole on both  Past medical history reviewed August 2018 Medtronic ICD device interrogated showing appropriate shock for VT  numerous episodes of VT with rate 167 up to 200 bpm, no therapy given Device reprogrammed 08/20/2016 Started on amiodarone infusion, Echocardiogram at that time ejection fraction 25% with dilated LV, global hypokinesis Stress test results discussed with her in detail, anterior wall ischemia  cardiac catheterization showed significant two-vessel coronary artery disease with known chronically occluded left circumflex with right-to-left collaterals.  There was progression of mid LAD disease.  -- treated successfully with PCI and 2 overlapped drug-eluting stent placement.  First diagonal was jailed by the stent with sluggish flow but no significant ostial stenosis.      Prior CV studies:   The following studies were reviewed today:    Past Medical History:   Diagnosis Date  . Anginal pain (HCC)   . Automatic implantable cardioverter-defibrillator in situ    a. s/p MDT ICD 07/2012; b. SN # [id number]   . CAD (coronary artery disease)    a. cath 2003: LM nl, mid LAD 50%, LCx nl, RCA nl b. L&RHC 08/17/2014 100% occluded mid LCx, 70% mid LAD with FFR 0.82. Medical therapy. CI 2.1. CO 4.01c. LHC 8/18: CTO LCx w/ R-L colatts, LAD s/p PCI/DES x 2  . Chronic systolic CHF (congestive heart failure) (HCC)    a. echo 2014: EF 25%, diffuse HK, LA moderately dilated, RA mildly dilated, PASP 38 mm Hg; b. echo 08/2014: EF 25-30%, diffuse HK, RWMA cannot be excluded, mild MR, mild biatrial enlargement, RV mildly dilated, wall thickness nl, pacer wire/catheter noted, mild to mod TR, PASP high nl, c. TTE 8/18: EF 25-30%, diffuse HK, GR1DD, mod MR, mod dilated LA, nl RV sys fxn, PASP 56  . Encounter for long-term (current) use of anticoagulants   . Exertional shortness of breath   . H/O medication noncompliance   . High cholesterol   . Hypertension   . NICM (nonischemic cardiomyopathy) (HCC)    a. s/p AICD 07/22/2012  . Obesity   . OSA on CPAP    "suppose to; not often" (07/22/2012)  . Other "heavy-for-dates" infants   . PAF (paroxysmal atrial fibrillation) (HCC)    a. on warfarin--> Eliquis 08/2014   Past Surgical History:  Procedure Laterality Date  . ABDOMINAL HYSTERECTOMY  ~ 1998   "laparoscopic" (07/22/2012)  . CARDIAC CATHETERIZATION  ? 1990's  . CARDIAC CATHETERIZATION N/A 08/17/2014   Procedure: Right/Left Heart Cath and Coronary Angiography;  Surgeon: Kathleene Hazel, MD;  Location: [facility] INVASIVE CV LAB;  Service: Cardiovascular;  Laterality: N/A;  . CARDIAC DEFIBRILLATOR PLACEMENT  07/22/2012   dual-chamber ICD.  Marland Kitchen CORONARY STENT INTERVENTION N/A 08/24/2016   Procedure: CORONARY STENT INTERVENTION;  Surgeon: Iran Ouch, MD;  Location: [facility] INVASIVE CV LAB;  Service: Cardiovascular;  Laterality: N/A;  . IMPLANTABLE CARDIOVERTER DEFIBRILLATOR  IMPLANT N/A 07/22/2012   Procedure: IMPLANTABLE CARDIOVERTER DEFIBRILLATOR IMPLANT;  Surgeon: Marinus Maw, MD;  Location: [facility] CATH LAB;  Service: Cardiovascular;  Laterality: N/A;  . LEFT HEART CATH AND CORONARY ANGIOGRAPHY N/A 08/24/2016   Procedure: LEFT HEART CATH AND CORONARY ANGIOGRAPHY;  Surgeon: Iran Ouch, MD;  Location: [facility] INVASIVE CV LAB;  Service: Cardiovascular;  Laterality: N/A;  . TEE WITHOUT CARDIOVERSION N/A 08/21/2014   Procedure: TRANSESOPHAGEAL ECHOCARDIOGRAM (TEE);  Surgeon: Chilton Si, MD;  Location: [facility] ENDOSCOPY;  Service: Cardiovascular;  Laterality: N/A;  . TUBAL LIGATION  ~ 1978     Current Meds  Medication Sig  . amiodarone (PACERONE) 200 MG tablet Take 1/2 tablet (100 mg) by mouth once daily  . apixaban (ELIQUIS) 5 MG TABS tablet Take 1 tablet (5 mg total) by mouth 2 (two) times daily.  Marland Kitchen atorvastatin (LIPITOR) 40 MG tablet TAKE 1 TABLET BY MOUTH ONCE DAILY AT  6  PM  . carvedilol (COREG) 3.125 MG tablet TAKE 1 TABLET BY MOUTH TWICE DAILY WITH A MEAL  . clopidogrel (PLAVIX) 75 MG tablet Take 1  tablet by mouth once daily with breakfast  . levothyroxine (SYNTHROID, LEVOTHROID) 88 MCG tablet Take 1 tablet (88 mcg total) by mouth daily before breakfast.  . spironolactone (ALDACTONE) 25 MG tablet Take 1/2 (one-half) tablet by mouth once daily  . [DISCONTINUED] furosemide (LASIX) 40 MG tablet Take 1 tablet (40 mg total) by mouth daily.  . [DISCONTINUED] sacubitril-valsartan (ENTRESTO) 49-51 MG Take 1 tablet by mouth 2 (two) times daily.     Allergies:   Patient has no known allergies.   Social History   Tobacco Use  . Smoking status: Never Smoker  . Smokeless tobacco: Never Used  Substance Use Topics  . Alcohol use: No  . Drug use: No     Current Outpatient Medications on File Prior to Visit  Medication Sig Dispense Refill  . amiodarone (PACERONE) 200 MG tablet Take 1/2 tablet (100 mg) by mouth once daily    . apixaban (ELIQUIS) 5 MG TABS tablet  Take 1 tablet (5 mg total) by mouth 2 (two) times daily. 60 tablet 11  . atorvastatin (LIPITOR) 40 MG tablet TAKE 1 TABLET BY MOUTH ONCE DAILY AT  6  PM 90 tablet 2  . carvedilol (COREG) 3.125 MG tablet TAKE 1 TABLET BY MOUTH TWICE DAILY WITH A MEAL 180 tablet 2  . clopidogrel (PLAVIX) 75 MG tablet Take 1 tablet by mouth once daily with breakfast 90 tablet 1  . levothyroxine (SYNTHROID, LEVOTHROID) 88 MCG tablet Take 1 tablet (88 mcg total) by mouth daily before breakfast. 90 tablet 1  . spironolactone (ALDACTONE) 25 MG tablet Take 1/2 (one-half) tablet by mouth once daily 45 tablet 0   No current facility-administered medications on file prior to visit.      Family Hx: The patient's family history includes Diabetes in her sister; Heart disease in her father and mother; Heart failure in her brother; Lung disease in her sister. There is no history of Thyroid disease.  ROS:   Please see the history of present illness.    Review of Systems  Constitutional: Negative.   Respiratory: Positive for shortness of breath.   Cardiovascular: Positive for leg swelling.  Gastrointestinal: Negative.   Musculoskeletal: Negative.   Neurological: Negative.   Psychiatric/Behavioral: Negative.   All other systems reviewed and are negative.     Labs/Other Tests and Data Reviewed:    Recent Labs: 12/07/2017: ALT 57; BUN 23; Creatinine, Ser 1.14; Hemoglobin 11.3; Platelets 409; Potassium 5.0; Sodium 141 02/15/2018: TSH 6.06   Recent Lipid Panel Lab Results  Component Value Date/Time   CHOL 130 08/21/2014 04:34 AM   TRIG 72 08/21/2014 04:34 AM   HDL 25 (L) 08/21/2014 04:34 AM   CHOLHDL 5.2 08/21/2014 04:34 AM   LDLCALC 91 08/21/2014 04:34 AM    Wt Readings from Last 3 Encounters:  02/15/18 209 lb 12.8 oz (95.2 kg)  01/11/18 213 lb 9.6 oz (96.9 kg)  12/14/17 206 lb 12.8 oz (93.8 kg)     Exam:    Vital Signs: Vital signs may also be detailed in the HPI There were no vitals taken for this  visit.  Wt Readings from Last 3 Encounters:  02/15/18 209 lb 12.8 oz (95.2 kg)  01/11/18 213 lb 9.6 oz (96.9 kg)  12/14/17 206 lb 12.8 oz (93.8 kg)   Temp Readings from Last 3 Encounters:  08/26/16 98.4 F (36.9 C) (Oral)  11/01/14 98.2 F (36.8 C) (Oral)  10/02/14 98.1 F (36.7 C) (Oral)   BP Readings from Last 3 Encounters:  02/15/18 120/60  01/11/18 128/68  12/14/17 122/74   Pulse Readings from Last 3 Encounters:  02/15/18 82  01/11/18 80  12/14/17 85    Vitals  120/60, pulse 80, respirations 16  Well nourished, well developed female in no acute distress. Constitutional:  oriented to person, place, and time. No distress.  Head: Normocephalic and atraumatic.  Eyes:  no discharge. No scleral icterus.  Neck: Normal range of motion. Neck supple.  Pulmonary/Chest: No audible wheezing, no distress, appears comfortable Musculoskeletal: Normal range of motion.  no  tenderness or deformity.  Neurological:   Coordination normal. Full exam not performed Skin:  No rash Psychiatric:  normal mood and affect. behavior is normal. Thought content normal.    ASSESSMENT & PLAN:    Chronic systolic CHF (congestive heart failure) (HCC) She reports Lasix does not seem to be working for her Recommend recommended the following changes - Stop furosemide - Start torsemide 20 mg- take 2 tablets (40 mg) by mouth twice daily  Goal weight is 209 (current weight 220) - Stop entresto.  Unable to afford secondary to price - Start losartan 100 mg - take 1 tablet by mouth once daily Monitor blood pressure  AICD (automatic cardioverter/defibrillator) present Followed by EP, on recent run VT pace terminated  Atherosclerosis of native coronary artery of native heart with stable angina pectoris (HCC) Currently with no symptoms of angina. No further workup at this time. Continue current medication regimen.  Atrial fibrillation with RVR (HCC) On anticoagulation, Eliquis Did not qualify for  assistance She does not want warfarin  CKD (chronic kidney disease) stage 3, GFR 30-59 ml/min (HCC) Stable lab work, reviewed from December 2019   complex sleep apnea syndrome  Essential hypertension Medication changes as above Unable to afford Entresto Change back to losartan  NICM (nonischemic cardiomyopathy) (HCC)  Pulmonary HTN (HCC) Change to torsemide as above Close follow-up with visit in 8 days  COVID-19 Education: The signs and symptoms of COVID-19 were discussed with the patient and how to seek care for testing (follow up with PCP or arrange E-visit).  The importance of social distancing was discussed today.  Patient Risk:   After full review of this patients clinical status, I feel that they are at least moderate risk at this time.  Time:   Today, I have spent 25 minutes with the patient with telehealth technology discussing the cardiac and medical problems/diagnoses detailed above   10 min spent reviewing the chart prior to patient visit today   Medication Adjustments/Labs and Tests Ordered: Current medicines are reviewed at length with the patient today.  Concerns regarding medicines are outlined above.   Tests Ordered: No tests ordered   Medication Changes: No changes made   Disposition: Follow-up in 8 days   Signed, Julien Nordmann, MD  04/25/2018 5:35 PM    [facility] [address], [place], [place] [address]

## 2018-04-25 NOTE — Patient Instructions (Addendum)
Medication Instructions:  Your physician has recommended you make the following change in your medication:   - Stop furosemide  - Start torsemide 20 mg- take 2 tablets (40 mg) by mouth twice daily  Goal weight is 209 (current weight 220)  - Stop entresto  - Start losartan 100 mg - take 1 tablet by mouth once daily  If you need a refill on your cardiac medications before your next appointment, please call your pharmacy.    Lab work: No new labs needed  If you have labs (blood work) drawn today and your tests are completely normal, you will receive your results only by: Marland Kitchen MyChart Message (if you have MyChart) OR . A paper copy in the mail If you have any lab test that is abnormal or we need to change your treatment, we will call you to review the results.   Testing/Procedures: No new testing needed   Follow-Up: At [facility], you and your health needs are our priority.  As part of our continuing mission to provide you with exceptional heart care, we have created designated Provider Care Teams.  These Care Teams include your primary Cardiologist (physician) and Advanced Practice Providers (APPs -  Physician Assistants and Nurse Practitioners) who all work together to provide you with the care you need, when you need it.  . You will need a follow up appointment in 8 days- Monday 05/02/18 @ 9:00 am with Dr. Mariah Milling (Video Visit)  . Providers on your designated Care Team:   . Nicolasa Ducking, NP . Eula Listen, PA-C . Marisue Ivan, PA-C  Any Other Special Instructions Will Be Listed Below (If Applicable).  For educational health videos Log in to : [email or web address] Or : [email or web address], password : [email or web address]

## 2018-04-30 NOTE — Progress Notes (Signed)
Virtual Visit via Video Note   This visit type was conducted due to national recommendations for restrictions regarding the COVID-19 Pandemic (e.g. social distancing) in an effort to limit this patient's exposure and mitigate transmission in our community.  Due to her co-morbid illnesses, this patient is at least at moderate risk for complications without adequate follow up.  This format is felt to be most appropriate for this patient at this time.  All issues noted in this document were discussed and addressed.  A limited physical exam was performed with this format.  Please refer to the patient's chart for her consent to telehealth for [facility].   I connected with  Erin [name] on 04/30/18 by a video enabled telemedicine application and verified that I am speaking with the correct person using two identifiers. I discussed the limitations of evaluation and management by telemedicine. The patient expressed understanding and agreed to proceed.   Evaluation Performed:  Follow-up visit  Date:  04/30/2018   ID:  Erin [name], DOB 08-01-1945, MRN [id number]  Patient Location:  [phone] [name] [address]   Provider location:   [facility], [place] office  PCP:  Patient, No Pcp Per  Cardiologist:  Fonnie Mu   Chief Complaint:  Leg swelling   History of Present Illness:    Erin [name] is a 73 y.o. female who presents via audio/video conferencing for a telehealth visit today.   The patient does not symptoms concerning for COVID-19 infection (fever, chills, cough, or new SHORTNESS OF BREATH).   Patient has a past medical history of coronary artery disease,  paroxysmal atrial fibrillation,  chronic systolic CHF   Medtronic ICD placed July 2014 lost to follow-up for the past 2 years, Recently ran out of her medications who presented to [facility] 08/20/2016  after episode of weakness, ICD shock while she was at the [employer] Ejection  fraction 25 to 30% in November 2018 Right heart pressures of 70 at that time PCI of mid LAD disease, 2 drug-eluting stents. 08/2016 occluded left circumflex with right-to-left collaterals.  She presents for follow-up of her cardiomyopathy, sustained VT   She called on call cardiology 04/23/2018 "Patient called regarding fluid building up in her legs and orthopnea.  She has been sleeping in a recliner due to orthopnea.  Scale is broken.  She tried taking an extra lasix a couple times but didn't have much improvement.  Symptoms have been ongoing for over a week.  We will increase lasix to  bid x3 days followed by  bid  Recently seen by myself last week at which time we made some medication changes as detailed below - Stop furosemide - Start torsemide 20 mg- take 2 tablets (40 mg) by mouth twice daily  Goal weight is 209 (current weight 220) - Stop entresto.  Unable to afford secondary to price - Start losartan 100 mg - take 1 tablet by mouth once daily Monitor blood pressure  On today's discussion Weight 213 Taking torsemide 40 mg twice a day Legs swollen and sore: Abdomen and breathing much better on torsemide Goal is 209 She has not been checking her blood pressure  Overall feeling improved Taking losartan  No regular exercise, poor diet in general, no chest pain or shortness of breath  Other past medical history reviewed ICD measurements closely followed by Randon Goldsmith, not since 05/2017 Lawson Fiscal has been unable to contact her by phone  Difficulty affording both Entresto and  Eliquis  Prior CV studies:   The following studies were reviewed today:  August 2018 Medtronic ICD device interrogated showing appropriate shock for VT  numerous episodes of VT with rate 167 up to 200 bpm, no therapy given Device reprogrammed 08/20/2016 Started on amiodarone infusion, Echocardiogram at that time ejection fraction 25% with dilated LV, global hypokinesis Stress test results discussed  with her in detail, anterior wall ischemia  cardiac catheterization showed significant two-vessel coronary artery disease with known chronically occluded left circumflex with right-to-left collaterals. There was progression of mid LAD disease.  -- treated successfully with PCI and 2 overlapped drug-eluting stent placement.  First diagonal was jailed by the stent with sluggish flow but no significant ostial stenosis.   Past Medical History:  Diagnosis Date  . Anginal pain (HCC)   . Automatic implantable cardioverter-defibrillator in situ    a. s/p MDT ICD 07/2012; b. SN # [id number]   . CAD (coronary artery disease)    a. cath 2003: LM nl, mid LAD 50%, LCx nl, RCA nl b. L&RHC 08/17/2014 100% occluded mid LCx, 70% mid LAD with FFR 0.82. Medical therapy. CI 2.1. CO 4.01c. LHC 8/18: CTO LCx w/ R-L colatts, LAD s/p PCI/DES x 2  . Chronic systolic CHF (congestive heart failure) (HCC)    a. echo 2014: EF 25%, diffuse HK, LA moderately dilated, RA mildly dilated, PASP 38 mm Hg; b. echo 08/2014: EF 25-30%, diffuse HK, RWMA cannot be excluded, mild MR, mild biatrial enlargement, RV mildly dilated, wall thickness nl, pacer wire/catheter noted, mild to mod TR, PASP high nl, c. TTE 8/18: EF 25-30%, diffuse HK, GR1DD, mod MR, mod dilated LA, nl RV sys fxn, PASP 56  . Encounter for long-term (current) use of anticoagulants   . Exertional shortness of breath   . H/O medication noncompliance   . High cholesterol   . Hypertension   . NICM (nonischemic cardiomyopathy) (HCC)    a. s/p AICD 07/22/2012  . Obesity   . OSA on CPAP    "suppose to; not often" (07/22/2012)  . Other "heavy-for-dates" infants   . PAF (paroxysmal atrial fibrillation) (HCC)    a. on warfarin--> Eliquis 08/2014   Past Surgical History:  Procedure Laterality Date  . ABDOMINAL HYSTERECTOMY  ~ 1998   "laparoscopic" (07/22/2012)  . CARDIAC CATHETERIZATION  ? 1990's  . CARDIAC CATHETERIZATION N/A 08/17/2014   Procedure: Right/Left Heart  Cath and Coronary Angiography;  Surgeon: Kathleene Hazel, MD;  Location: [facility] INVASIVE CV LAB;  Service: Cardiovascular;  Laterality: N/A;  . CARDIAC DEFIBRILLATOR PLACEMENT  07/22/2012   dual-chamber ICD.  Marland Kitchen CORONARY STENT INTERVENTION N/A 08/24/2016   Procedure: CORONARY STENT INTERVENTION;  Surgeon: Iran Ouch, MD;  Location: [facility] INVASIVE CV LAB;  Service: Cardiovascular;  Laterality: N/A;  . IMPLANTABLE CARDIOVERTER DEFIBRILLATOR IMPLANT N/A 07/22/2012   Procedure: IMPLANTABLE CARDIOVERTER DEFIBRILLATOR IMPLANT;  Surgeon: Marinus Maw, MD;  Location: [facility] CATH LAB;  Service: Cardiovascular;  Laterality: N/A;  . LEFT HEART CATH AND CORONARY ANGIOGRAPHY N/A 08/24/2016   Procedure: LEFT HEART CATH AND CORONARY ANGIOGRAPHY;  Surgeon: Iran Ouch, MD;  Location: [facility] INVASIVE CV LAB;  Service: Cardiovascular;  Laterality: N/A;  . TEE WITHOUT CARDIOVERSION N/A 08/21/2014   Procedure: TRANSESOPHAGEAL ECHOCARDIOGRAM (TEE);  Surgeon: Chilton Si, MD;  Location: [facility] ENDOSCOPY;  Service: Cardiovascular;  Laterality: N/A;  . TUBAL LIGATION  ~ 1978     No outpatient medications have been marked as taking for the 05/02/18 encounter (Appointment) with Mariah Milling,  Tollie Pizza, MD.     Allergies:   Patient has no known allergies.   Social History   Tobacco Use  . Smoking status: Never Smoker  . Smokeless tobacco: Never Used  Substance Use Topics  . Alcohol use: No  . Drug use: No     Current Outpatient Medications on File Prior to Visit  Medication Sig Dispense Refill  . amiodarone (PACERONE) 200 MG tablet Take 1/2 tablet (100 mg) by mouth once daily    . apixaban (ELIQUIS) 5 MG TABS tablet Take 1 tablet (5 mg total) by mouth 2 (two) times daily. 60 tablet 11  . atorvastatin (LIPITOR) 40 MG tablet TAKE 1 TABLET BY MOUTH ONCE DAILY AT  6  PM 90 tablet 2  . carvedilol (COREG) 3.125 MG tablet TAKE 1 TABLET BY MOUTH TWICE DAILY WITH A MEAL 180 tablet 2  . clopidogrel (PLAVIX) 75 MG  tablet Take 1 tablet by mouth once daily with breakfast 90 tablet 1  . levothyroxine (SYNTHROID, LEVOTHROID) 88 MCG tablet Take 1 tablet (88 mcg total) by mouth daily before breakfast. 90 tablet 1  . losartan (COZAAR) 100 MG tablet Take 1 tablet (100 mg total) by mouth daily. 90 tablet 3  . spironolactone (ALDACTONE) 25 MG tablet Take 1/2 (one-half) tablet by mouth once daily 45 tablet 0  . torsemide (DEMADEX) 20 MG tablet Take 2 tablets (40 mg) by mouth twice daily 120 tablet 3   No current facility-administered medications on file prior to visit.      Family Hx: The patient's family history includes Diabetes in her sister; Heart disease in her father and mother; Heart failure in her brother; Lung disease in her sister. There is no history of Thyroid disease.  ROS:   Please see the history of present illness.    Review of Systems  Constitutional: Negative.   Respiratory: Negative.   Cardiovascular: Positive for leg swelling.  Gastrointestinal: Negative.   Musculoskeletal: Negative.   Neurological: Negative.   Psychiatric/Behavioral: Negative.   All other systems reviewed and are negative.     Labs/Other Tests and Data Reviewed:    Recent Labs: 12/07/2017: ALT 57; BUN 23; Creatinine, Ser 1.14; Hemoglobin 11.3; Platelets 409; Potassium 5.0; Sodium 141 02/15/2018: TSH 6.06   Recent Lipid Panel Lab Results  Component Value Date/Time   CHOL 130 08/21/2014 04:34 AM   TRIG 72 08/21/2014 04:34 AM   HDL 25 (L) 08/21/2014 04:34 AM   CHOLHDL 5.2 08/21/2014 04:34 AM   LDLCALC 91 08/21/2014 04:34 AM    Wt Readings from Last 3 Encounters:  02/15/18 209 lb 12.8 oz (95.2 kg)  01/11/18 213 lb 9.6 oz (96.9 kg)  12/14/17 206 lb 12.8 oz (93.8 kg)     Exam:    Vital Signs: Vital signs may also be detailed in the HPI There were no vitals taken for this visit.  Wt Readings from Last 3 Encounters:  02/15/18 209 lb 12.8 oz (95.2 kg)  01/11/18 213 lb 9.6 oz (96.9 kg)  12/14/17 206 lb 12.8  oz (93.8 kg)   Temp Readings from Last 3 Encounters:  08/26/16 98.4 F (36.9 C) (Oral)  11/01/14 98.2 F (36.8 C) (Oral)  10/02/14 98.1 F (36.7 C) (Oral)   BP Readings from Last 3 Encounters:  02/15/18 120/60  01/11/18 128/68  12/14/17 122/74   Pulse Readings from Last 3 Encounters:  02/15/18 82  01/11/18 80  12/14/17 85     Well nourished, well developed female in no acute  distress. Constitutional:  oriented to person, place, and time. No distress.  Head: Normocephalic and atraumatic.  Eyes:  no discharge. No scleral icterus.  Neck: Normal range of motion. Neck supple.  Pulmonary/Chest: No audible wheezing, no distress, appears comfortable Musculoskeletal: Normal range of motion.  no  tenderness or deformity.  Neurological:   Coordination normal. Full exam not performed Skin:  No rash Psychiatric:  normal mood and affect. behavior is normal. Thought content normal.    ASSESSMENT & PLAN:    Atherosclerosis of native coronary artery of native heart with stable angina pectoris (HCC) stent placement x2, on Plavix, no anginal symptoms No changes since her last visit last week, no ischemic work-up ordered  Congestive dilated cardiomyopathy (HCC) Changed to losartan last week, torsemide 40 twice daily with improvement in her weight We have ordered complete metabolic panel, Decrease torsemide down to 40 daily for weight 209 or less Torsemide 40 twice daily for 210 or higher  Atrial fibrillation with RVR (HCC) - Plan: EKG 12-Lead Maintaining normal sinus rhythm, will continue anticoagulation ICD check today with Dr. Graciela Husbands Trouble in the past affording Eliquis  Essential hypertension Recommend she monitor blood pressure on losartan and after diuresis and call us with numbers  Ventricular tachycardia (HCC) Several prior echocardiograms with  ejection fraction 25% to 30% On amiodarone 200 daily Appears relatively euvolemic Denies any tachycardia concerning for  arrhythmia  Chronic systolic heart failure (HCC) Changes as above, goal weight 209 with adjustment of torsemide as detailed above   COVID-19 Education: The signs and symptoms of COVID-19 were discussed with the patient and how to seek care for testing (follow up with PCP or arrange E-visit).  The importance of social distancing was discussed today.  Patient Risk:   After full review of this patients clinical status, I feel that they are at least moderate risk at this time.  Time:   Today, I have spent 25 minutes with the patient with telehealth technology discussing the cardiac and medical problems/diagnoses detailed above   10 min spent reviewing the chart prior to patient visit today   Medication Adjustments/Labs and Tests Ordered: Current medicines are reviewed at length with the patient today.  Concerns regarding medicines are outlined above.   Tests Ordered: No tests ordered   Medication Changes: No changes made   Disposition: Follow-up in 6 months   Signed, Julien Nordmann, MD  04/30/2018 12:40 PM    [facility] [address], [place], [place] [address]

## 2018-05-02 ENCOUNTER — Other Ambulatory Visit: Payer: Self-pay

## 2018-05-02 ENCOUNTER — Telehealth (INDEPENDENT_AMBULATORY_CARE_PROVIDER_SITE_OTHER): Payer: Medicare Other | Admitting: Cardiovascular Disease

## 2018-05-02 DIAGNOSIS — N183 Chronic kidney disease, stage 3 unspecified: Secondary | ICD-10-CM

## 2018-05-02 DIAGNOSIS — I4891 Unspecified atrial fibrillation: Secondary | ICD-10-CM

## 2018-05-02 DIAGNOSIS — Z95810 Presence of automatic (implantable) cardiac defibrillator: Secondary | ICD-10-CM | POA: Diagnosis not present

## 2018-05-02 DIAGNOSIS — I472 Ventricular tachycardia, unspecified: Secondary | ICD-10-CM

## 2018-05-02 DIAGNOSIS — I5022 Chronic systolic (congestive) heart failure: Secondary | ICD-10-CM

## 2018-05-02 DIAGNOSIS — G4731 Primary central sleep apnea: Secondary | ICD-10-CM

## 2018-05-02 DIAGNOSIS — I25118 Atherosclerotic heart disease of native coronary artery with other forms of angina pectoris: Secondary | ICD-10-CM

## 2018-05-02 DIAGNOSIS — I10 Essential (primary) hypertension: Secondary | ICD-10-CM

## 2018-05-02 DIAGNOSIS — I480 Paroxysmal atrial fibrillation: Secondary | ICD-10-CM

## 2018-05-02 DIAGNOSIS — I428 Other cardiomyopathies: Secondary | ICD-10-CM

## 2018-05-02 DIAGNOSIS — I272 Pulmonary hypertension, unspecified: Secondary | ICD-10-CM

## 2018-05-02 NOTE — Patient Instructions (Addendum)
Medication Instructions:  Your physician has recommended you make the following change in your medication:  1. CONTINUE Torsemide 40 mg twice a day for weight greater than 209. Once weight is less than 209 decrease Torsemide back down to 40 mg once a day.    If you need a refill on your cardiac medications before your next appointment, please call your pharmacy.    Lab work: CMP   Please go to [facility] and check in at the first desk on the right and they will direct you where to go. No appointment needed.   If you have labs (blood work) drawn today and your tests are completely normal, you will receive your results only by: Marland Kitchen MyChart Message (if you have MyChart) OR . A paper copy in the mail If you have any lab test that is abnormal or we need to change your treatment, we will call you to review the results.   Testing/Procedures: No new testing needed   Follow-Up: At [facility], you and your health needs are our priority.  As part of our continuing mission to provide you with exceptional heart care, we have created designated Provider Care Teams.  These Care Teams include your primary Cardiologist (physician) and Advanced Practice Providers (APPs -  Physician Assistants and Nurse Practitioners) who all work together to provide you with the care you need, when you need it.  . You will need a follow up appointment in 2 weeks SCHEDULED May 17, 2018  At 10:40AM  . Providers on your designated Care Team:   . Nicolasa Ducking, NP . Eula Listen, PA-C . Marisue Ivan, PA-C  Any Other Special Instructions Will Be Listed Below (If Applicable).  For educational health videos Log in to : [email or web address] Or : [email or web address], password : [email or web address]

## 2018-05-09 ENCOUNTER — Other Ambulatory Visit: Payer: Self-pay

## 2018-05-09 ENCOUNTER — Other Ambulatory Visit
Admission: RE | Admit: 2018-05-09 | Discharge: 2018-05-09 | Disposition: A | Discharge status: Home / Self Care | Payer: Medicare Other | Attending: Cardiovascular Disease | Admitting: Cardiovascular Disease

## 2018-05-09 DIAGNOSIS — I25118 Atherosclerotic heart disease of native coronary artery with other forms of angina pectoris: Secondary | ICD-10-CM | POA: Insufficient documentation

## 2018-05-09 DIAGNOSIS — I5022 Chronic systolic (congestive) heart failure: Secondary | ICD-10-CM | POA: Diagnosis present

## 2018-05-09 LAB — COMPREHENSIVE METABOLIC PANEL
ALT: 56 U/L — ABNORMAL HIGH (ref 0–44)
AST: 67 U/L — ABNORMAL HIGH (ref 15–41)
Albumin: 3.9 g/dL (ref 3.5–5.0)
Alkaline Phosphatase: 84 U/L (ref 38–126)
Anion gap: 13 (ref 5–15)
BUN: 35 mg/dL — ABNORMAL HIGH (ref 8–23)
CO2: 26 mmol/L (ref 22–32)
Calcium: 8.7 mg/dL — ABNORMAL LOW (ref 8.9–10.3)
Chloride: 97 mmol/L — ABNORMAL LOW (ref 98–111)
Creatinine, Ser: 1.35 mg/dL — ABNORMAL HIGH (ref 0.44–1.00)
GFR calc Af Amer: 45 mL/min — ABNORMAL LOW (ref >60)
GFR calc non Af Amer: 39 mL/min — ABNORMAL LOW (ref >60)
Glucose, Bld: 106 mg/dL — ABNORMAL HIGH (ref 70–99)
Potassium: 4.2 mmol/L (ref 3.5–5.1)
Sodium: 136 mmol/L (ref 135–145)
Total Bilirubin: 0.7 mg/dL (ref 0.3–1.2)
Total Protein: 7.6 g/dL (ref 6.5–8.1)

## 2018-05-15 NOTE — Progress Notes (Signed)
Virtual Visit via Video Note   This visit type was conducted due to national recommendations for restrictions regarding the COVID-19 Pandemic (e.g. social distancing) in an effort to limit this patient's exposure and mitigate transmission in our community.  Due to her co-morbid illnesses, this patient is at least at moderate risk for complications without adequate follow up.  This format is felt to be most appropriate for this patient at this time.  All issues noted in this document were discussed and addressed.  A limited physical exam was performed with this format.  Please refer to the patient's chart for her consent to telehealth for [facility].   I connected with  Erin [name] on 05/17/18 by a video enabled telemedicine application and verified that I am speaking with the correct person using two identifiers. I discussed the limitations of evaluation and management by telemedicine. The patient expressed understanding and agreed to proceed.   Evaluation Performed:  Follow-up visit  Date:  05/17/2018   ID:  Erin [name], DOB 09-26-1945, MRN [id number]  Patient Location:  [phone] [name] [address]   Provider location:   [facility], [place] office  PCP:  Patient, No Pcp Per  Cardiologist:  Fonnie Mu   Chief Complaint: Weight loss    History of Present Illness:    Erin [name] is a 73 y.o. female who presents via audio/video conferencing for a telehealth visit today.   The patient does not symptoms concerning for COVID-19 infection (fever, chills, cough, or new SHORTNESS OF BREATH).   Patient has a past medical history of coronary artery disease,  paroxysmal atrial fibrillation,  chronic systolic CHF   Medtronic ICD placed July 2014 lost to follow-up for the past 2 years, Recently ran out of her medications who presented to [facility] 08/20/2016  after episode of weakness, ICD shock while she was at the supermarket Ejection  fraction 25 to 30% in November 2018 Right heart pressures of 70 at that time PCI of mid LAD disease, 2 drug-eluting stents. 08/2016 occluded left circumflex with right-to-left collaterals.  She presents for follow-up of her cardiomyopathy, sustained VT   On today's visit she reports that she is torsemide based on the weight In general she is taking alternating torsemide 40 daily with 40 BID Labs reviewed , CR 1.3 days ago, high end of her range concerning for prerenal state  Dramatic weight drop in the past several weeks 201 today, her last phone call was 213  No BP cuff at home to check blood pressure No dizziness /orthostasis Overall she reports that she feels well with no significant leg swelling no shortness of breath  No regular exercise, poor diet in general, no chest pain or shortness of breath  Other past medical history She called on call cardiology 04/23/2018 "Patient called regarding fluid building up in her legs and orthopnea. She has been sleeping in a recliner due to orthopnea. Scale is broken. She tried taking an extra lasix a couple times but didn't have much improvement. Symptoms have been ongoing for over a week. We will increase lasix to 80mg  bid x3 days followed by 40mg  bid  August 2018 Medtronic ICD device interrogated showing appropriate shock for VT  numerous episodes of VT with rate 167 up to 200 bpm, no therapy given Device reprogrammed 08/20/2016 Started on amiodarone infusion, Echocardiogram at that time ejection fraction 25% with dilated LV, global hypokinesis Stress test results discussed with her in detail,  anterior wall ischemia  cardiac catheterization showed significant two-vessel coronary artery disease with known chronically occluded left circumflex with right-to-left collaterals. There was progression of mid LAD disease.  -- treated successfully with PCI and 2 overlapped drug-eluting stent placement.  First diagonal was jailed by the stent  with sluggish flow but no significant ostial stenosis.    Prior CV studies:   The following studies were reviewed today:    Past Medical History:  Diagnosis Date  . Anginal pain (HCC)   . Automatic implantable cardioverter-defibrillator in situ    a. s/p MDT ICD 07/2012; b. SN # [id number]   . CAD (coronary artery disease)    a. cath 2003: LM nl, mid LAD 50%, LCx nl, RCA nl b. L&RHC 08/17/2014 100% occluded mid LCx, 70% mid LAD with FFR 0.82. Medical therapy. CI 2.1. CO 4.01c. LHC 8/18: CTO LCx w/ R-L colatts, LAD s/p PCI/DES x 2  . Chronic systolic CHF (congestive heart failure) (HCC)    a. echo 2014: EF 25%, diffuse HK, LA moderately dilated, RA mildly dilated, PASP 38 mm Hg; b. echo 08/2014: EF 25-30%, diffuse HK, RWMA cannot be excluded, mild MR, mild biatrial enlargement, RV mildly dilated, wall thickness nl, pacer wire/catheter noted, mild to mod TR, PASP high nl, c. TTE 8/18: EF 25-30%, diffuse HK, GR1DD, mod MR, mod dilated LA, nl RV sys fxn, PASP 56  . Encounter for long-term (current) use of anticoagulants   . Exertional shortness of breath   . H/O medication noncompliance   . High cholesterol   . Hypertension   . NICM (nonischemic cardiomyopathy) (HCC)    a. s/p AICD 07/22/2012  . Obesity   . OSA on CPAP    "suppose to; not often" (07/22/2012)  . Other "heavy-for-dates" infants   . PAF (paroxysmal atrial fibrillation) (HCC)    a. on warfarin--> Eliquis 08/2014   Past Surgical History:  Procedure Laterality Date  . ABDOMINAL HYSTERECTOMY  ~ 1998   "laparoscopic" (07/22/2012)  . CARDIAC CATHETERIZATION  ? 1990's  . CARDIAC CATHETERIZATION N/A 08/17/2014   Procedure: Right/Left Heart Cath and Coronary Angiography;  Surgeon: Kathleene Hazel, MD;  Location: [facility] INVASIVE CV LAB;  Service: Cardiovascular;  Laterality: N/A;  . CARDIAC DEFIBRILLATOR PLACEMENT  07/22/2012   dual-chamber ICD.  Marland Kitchen CORONARY STENT INTERVENTION N/A 08/24/2016   Procedure: CORONARY STENT  INTERVENTION;  Surgeon: Iran Ouch, MD;  Location: [facility] INVASIVE CV LAB;  Service: Cardiovascular;  Laterality: N/A;  . IMPLANTABLE CARDIOVERTER DEFIBRILLATOR IMPLANT N/A 07/22/2012   Procedure: IMPLANTABLE CARDIOVERTER DEFIBRILLATOR IMPLANT;  Surgeon: Marinus Maw, MD;  Location: [facility] CATH LAB;  Service: Cardiovascular;  Laterality: N/A;  . LEFT HEART CATH AND CORONARY ANGIOGRAPHY N/A 08/24/2016   Procedure: LEFT HEART CATH AND CORONARY ANGIOGRAPHY;  Surgeon: Iran Ouch, MD;  Location: [facility] INVASIVE CV LAB;  Service: Cardiovascular;  Laterality: N/A;  . TEE WITHOUT CARDIOVERSION N/A 08/21/2014   Procedure: TRANSESOPHAGEAL ECHOCARDIOGRAM (TEE);  Surgeon: Chilton Si, MD;  Location: [facility] ENDOSCOPY;  Service: Cardiovascular;  Laterality: N/A;  . TUBAL LIGATION  ~ 1978     Current Meds  Medication Sig  . amiodarone (PACERONE) 200 MG tablet Take 1/2 tablet (100 mg) by mouth once daily  . apixaban (ELIQUIS) 5 MG TABS tablet Take 1 tablet (5 mg total) by mouth 2 (two) times daily.  Marland Kitchen atorvastatin (LIPITOR) 40 MG tablet TAKE 1 TABLET BY MOUTH ONCE DAILY AT  6  PM  . carvedilol (COREG) 3.125 MG tablet  TAKE 1 TABLET BY MOUTH TWICE DAILY WITH A MEAL  . clopidogrel (PLAVIX) 75 MG tablet Take 1 tablet by mouth once daily with breakfast  . levothyroxine (SYNTHROID, LEVOTHROID) 88 MCG tablet Take 1 tablet (88 mcg total) by mouth daily before breakfast.  . losartan (COZAAR) 100 MG tablet Take 1 tablet (100 mg total) by mouth daily.  Marland Kitchen spironolactone (ALDACTONE) 25 MG tablet Take 1/2 (one-half) tablet by mouth once daily  . [DISCONTINUED] torsemide (DEMADEX) 20 MG tablet Take 2 tablets (40 mg) by mouth twice daily     Allergies:   Patient has no known allergies.   Social History   Tobacco Use  . Smoking status: Never Smoker  . Smokeless tobacco: Never Used  Substance Use Topics  . Alcohol use: No  . Drug use: No     Current Outpatient Medications on File Prior to Visit  Medication  Sig Dispense Refill  . amiodarone (PACERONE) 200 MG tablet Take 1/2 tablet (100 mg) by mouth once daily    . apixaban (ELIQUIS) 5 MG TABS tablet Take 1 tablet (5 mg total) by mouth 2 (two) times daily. 60 tablet 11  . atorvastatin (LIPITOR) 40 MG tablet TAKE 1 TABLET BY MOUTH ONCE DAILY AT  6  PM 90 tablet 2  . carvedilol (COREG) 3.125 MG tablet TAKE 1 TABLET BY MOUTH TWICE DAILY WITH A MEAL 180 tablet 2  . clopidogrel (PLAVIX) 75 MG tablet Take 1 tablet by mouth once daily with breakfast 90 tablet 1  . levothyroxine (SYNTHROID, LEVOTHROID) 88 MCG tablet Take 1 tablet (88 mcg total) by mouth daily before breakfast. 90 tablet 1  . losartan (COZAAR) 100 MG tablet Take 1 tablet (100 mg total) by mouth daily. 90 tablet 3  . spironolactone (ALDACTONE) 25 MG tablet Take 1/2 (one-half) tablet by mouth once daily 45 tablet 0  . [DISCONTINUED] torsemide (DEMADEX) 20 MG tablet Take 2 tablets (40 mg) by mouth twice daily 120 tablet 3   No current facility-administered medications on file prior to visit.      Family Hx: The patient's family history includes Diabetes in her sister; Heart disease in her father and mother; Heart failure in her brother; Lung disease in her sister. There is no history of Thyroid disease.  ROS:   Please see the history of present illness.    Review of Systems  Constitutional: Positive for weight loss.  Respiratory: Negative.   Cardiovascular: Negative.   Gastrointestinal: Negative.   Musculoskeletal: Negative.   Neurological: Negative.   Psychiatric/Behavioral: Negative.   All other systems reviewed and are negative.     Labs/Other Tests and Data Reviewed:    Recent Labs: 12/07/2017: Hemoglobin 11.3; Platelets 409 02/15/2018: TSH 6.06 05/09/2018: ALT 56; BUN 35; Creatinine, Ser 1.35; Potassium 4.2; Sodium 136   Recent Lipid Panel Lab Results  Component Value Date/Time   CHOL 130 08/21/2014 04:34 AM   TRIG 72 08/21/2014 04:34 AM   HDL 25 (L) 08/21/2014 04:34 AM    CHOLHDL 5.2 08/21/2014 04:34 AM   LDLCALC 91 08/21/2014 04:34 AM    Wt Readings from Last 3 Encounters:  05/17/18 201 lb (91.2 kg)  02/15/18 209 lb 12.8 oz (95.2 kg)  01/11/18 213 lb 9.6 oz (96.9 kg)     Exam:    Vital Signs: Vital signs may also be detailed in the HPI Ht 5' (1.524 m)   Wt 201 lb (91.2 kg)   BMI 39.26 kg/m   Wt Readings from Last 3 Encounters:  05/17/18 201 lb (91.2 kg)  02/15/18 209 lb 12.8 oz (95.2 kg)  01/11/18 213 lb 9.6 oz (96.9 kg)   Temp Readings from Last 3 Encounters:  08/26/16 98.4 F (36.9 C) (Oral)  11/01/14 98.2 F (36.8 C) (Oral)  10/02/14 98.1 F (36.7 C) (Oral)   BP Readings from Last 3 Encounters:  02/15/18 120/60  01/11/18 128/68  12/14/17 122/74   Pulse Readings from Last 3 Encounters:  02/15/18 82  01/11/18 80  12/14/17 85    120/60 pulse 80 respirations 16  Well nourished, well developed female in no acute distress. Constitutional:  oriented to person, place, and time. No distress.  Head: Normocephalic and atraumatic.  Eyes:  no discharge. No scleral icterus.  Neck: Normal range of motion. Neck supple.  Pulmonary/Chest: No audible wheezing, no distress, appears comfortable Musculoskeletal: Normal range of motion.  no  tenderness or deformity.  Neurological:   Coordination normal. Full exam not performed Skin:  No rash Psychiatric:  normal mood and affect. behavior is normal. Thought content normal.    ASSESSMENT & PLAN:    Atherosclerosis of native coronary artery of native heart with stable angina pectoris (HCC) - Plan: Basic metabolic panel Currently with no symptoms of angina. No further workup at this time. Continue current medication regimen.  Chronic systolic CHF (congestive heart failure) (HCC) - Plan: Basic metabolic panel Weight is down to 201 pounds at home On last telephone call was 213 pounds Recommend she decrease torsemide down to 40 mg daily with only 20 mg every other day after lunch Would hold the  torsemide for weight less than 200 pounds Repeat BMP in 3 weeks time Overall she feels well edema resolved shortness of breath dramatically improved  Essential hypertension Blood pressure is well controlled on today's visit. No changes made to the medications.  NICM (nonischemic cardiomyopathy) (HCC) Unable to afford Entresto We will continue losartan carvedilol spironolactone with her torsemide  Complex sleep apnea syndrome  Pulmonary HTN (HCC) Dramatic improvement in her symptoms with torsemide in place of Lasix Continue plan as above  PAF (paroxysmal atrial fibrillation) (HCC) On amiodarone, Coreg  Ventricular tachycardia (HCC)   COVID-19 Education: The signs and symptoms of COVID-19 were discussed with the patient and how to seek care for testing (follow up with PCP or arrange E-visit).  The importance of social distancing was discussed today.  Patient Risk:   After full review of this patients clinical status, I feel that they are at least moderate risk at this time.  Time:   Today, I have spent 25 minutes with the patient with telehealth technology discussing the cardiac and medical problems/diagnoses detailed above   10 min spent reviewing the chart prior to patient visit today   Medication Adjustments/Labs and Tests Ordered: Current medicines are reviewed at length with the patient today.  Concerns regarding medicines are outlined above.   Tests Ordered: No tests ordered   Medication Changes: No changes made   Disposition: Follow-up in 2 months   Signed, Julien Nordmannimothy Arelis Neumeier, MD  05/17/2018 1:21 PM    [facility] [place] Office [address], [place], [place] [id number]

## 2018-05-17 ENCOUNTER — Other Ambulatory Visit: Payer: Self-pay

## 2018-05-17 ENCOUNTER — Telehealth (INDEPENDENT_AMBULATORY_CARE_PROVIDER_SITE_OTHER): Payer: Medicare Other | Admitting: Cardiovascular Disease

## 2018-05-17 VITALS — Ht 60.0 in | Wt 201.0 lb

## 2018-05-17 DIAGNOSIS — I5022 Chronic systolic (congestive) heart failure: Secondary | ICD-10-CM | POA: Diagnosis not present

## 2018-05-17 DIAGNOSIS — I428 Other cardiomyopathies: Secondary | ICD-10-CM

## 2018-05-17 DIAGNOSIS — I10 Essential (primary) hypertension: Secondary | ICD-10-CM | POA: Diagnosis not present

## 2018-05-17 DIAGNOSIS — I480 Paroxysmal atrial fibrillation: Secondary | ICD-10-CM

## 2018-05-17 DIAGNOSIS — I25118 Atherosclerotic heart disease of native coronary artery with other forms of angina pectoris: Secondary | ICD-10-CM | POA: Diagnosis not present

## 2018-05-17 DIAGNOSIS — G4731 Primary central sleep apnea: Secondary | ICD-10-CM | POA: Diagnosis not present

## 2018-05-17 DIAGNOSIS — I472 Ventricular tachycardia, unspecified: Secondary | ICD-10-CM

## 2018-05-17 DIAGNOSIS — I272 Pulmonary hypertension, unspecified: Secondary | ICD-10-CM

## 2018-05-17 NOTE — Progress Notes (Signed)
Patient had virtual visit with provider today with instructions for medication changes.

## 2018-05-17 NOTE — Patient Instructions (Addendum)
Medication Instructions:  Your physician has recommended you make the following change in your medication:  1. CHANGE Torsemide 20 mg Take 2 tablets (40 mg) every morning  2. ADD Torsemide 20 mg 1 tablet in the afternoon EVERY OTHER DAY  NO TORSEMIDE for weight 200 pounds or less.  If you need a refill on your cardiac medications before your next appointment, please call your pharmacy.    Lab work: BMP to be done in 3 weeks this will be around the first week of June. Please go to [facility] Entrance and check in at the first desk and they will let you know where to go for labs to be done. No appointment is needed.    If you have labs (blood work) drawn today and your tests are completely normal, you will receive your results only by: Marland Kitchen MyChart Message (if you have MyChart) OR . A paper copy in the mail If you have any lab test that is abnormal or we need to change your treatment, we will call you to review the results.   Testing/Procedures: No new testing needed   Follow-Up: At [facility], you and your health needs are our priority.  As part of our continuing mission to provide you with exceptional heart care, we have created designated Provider Care Teams.  These Care Teams include your primary Cardiologist (physician) and Advanced Practice Providers (APPs -  Physician Assistants and Nurse Practitioners) who all work together to provide you with the care you need, when you need it.  . You will need a follow up appointment in 2 months .   Please call our office 2 months in advance to schedule this appointment.    . Providers on your designated Care Team:   . Nicolasa Ducking, NP . Eula Listen, PA-C . Marisue Ivan, PA-C  Any Other Special Instructions Will Be Listed Below (If Applicable).  For educational health videos Log in to : [email or web address] Or : [email or web address], password : [email or web address]

## 2018-06-03 ENCOUNTER — Telehealth: Payer: Self-pay

## 2018-06-03 NOTE — Telephone Encounter (Signed)
LOV 04/27/18. Per Dr. Everardo All, f/u 6wks. Called to schedule appt. LVM requesting returned call.

## 2018-06-07 ENCOUNTER — Ambulatory Visit (INDEPENDENT_AMBULATORY_CARE_PROVIDER_SITE_OTHER): Payer: Medicare Other | Admitting: *Deleted

## 2018-06-07 DIAGNOSIS — I428 Other cardiomyopathies: Secondary | ICD-10-CM

## 2018-06-07 LAB — CUP PACEART REMOTE DEVICE CHECK
Battery Remaining Longevity: 51 mo
Battery Voltage: 2.96 V
Brady Statistic AP VP Percent: 0 %
Brady Statistic AP VS Percent: 0.01 %
Brady Statistic AS VP Percent: 0.03 %
Brady Statistic AS VS Percent: 99.96 %
Brady Statistic RA Percent Paced: 0.01 %
Brady Statistic RV Percent Paced: 0.04 %
Date Time Interrogation Session: 20200602072007
HighPow Impedance: 76 Ohm
Implantable Lead Implant Date: 20140718
Implantable Lead Implant Date: 20140718
Implantable Lead Location: 753859
Implantable Lead Location: 753860
Implantable Lead Model: 5076
Implantable Lead Model: 6935
Implantable Pulse Generator Implant Date: 20140718
Lead Channel Impedance Value: 304 Ohm
Lead Channel Impedance Value: 380 Ohm
Lead Channel Impedance Value: 456 Ohm
Lead Channel Pacing Threshold Amplitude: 0.5 V
Lead Channel Pacing Threshold Amplitude: 0.75 V
Lead Channel Pacing Threshold Pulse Width: 0.4 ms
Lead Channel Pacing Threshold Pulse Width: 0.4 ms
Lead Channel Sensing Intrinsic Amplitude: 12.25 mV
Lead Channel Sensing Intrinsic Amplitude: 12.25 mV
Lead Channel Sensing Intrinsic Amplitude: 3.625 mV
Lead Channel Sensing Intrinsic Amplitude: 3.625 mV
Lead Channel Setting Pacing Amplitude: 2 V
Lead Channel Setting Pacing Amplitude: 2.5 V
Lead Channel Setting Pacing Pulse Width: 0.4 ms
Lead Channel Setting Sensing Sensitivity: 0.3 mV

## 2018-06-10 ENCOUNTER — Other Ambulatory Visit: Payer: Self-pay

## 2018-06-13 ENCOUNTER — Ambulatory Visit: Payer: Medicare Other | Admitting: Endocrinology

## 2018-06-15 ENCOUNTER — Encounter: Payer: Self-pay | Admitting: Cardiology

## 2018-06-15 NOTE — Progress Notes (Signed)
Remote ICD transmission.   

## 2018-06-20 ENCOUNTER — Telehealth: Payer: Self-pay | Admitting: Internal Medicine

## 2018-06-20 NOTE — Telephone Encounter (Signed)
Notes recorded by Deboraha Sprang, MD on 06/20/2018 at 10:16 AM EDT  Remote reviewed.  This remote is abnormal for multiple episodes of VT both ATP terminated and then VT declared as nonsustained because of oscillations around VT detection. The burden of VT was associated with CHF much improved  2 things  1) H-- can arrange an in office visit for device reprogramming  And also set her up with [facility]  2) A-- can you follow up w Marzetta Board about noting in her report the issue of detection And  [place]

## 2018-06-20 NOTE — Telephone Encounter (Signed)
Yes - last therapy in April  Chanetta Marshall, NP 06/20/2018 4:37 PM

## 2018-06-20 NOTE — Telephone Encounter (Signed)
Device clinic:  Is her last ATP episode in April?

## 2018-06-23 NOTE — Telephone Encounter (Signed)
Attempted to call the patient. No answer- I left a message to please call back.  

## 2018-06-24 ENCOUNTER — Other Ambulatory Visit: Payer: Self-pay | Admitting: Internal Medicine

## 2018-06-24 ENCOUNTER — Telehealth: Payer: Self-pay | Admitting: Internal Medicine

## 2018-06-24 NOTE — Telephone Encounter (Signed)
I spoke with the patient regarding her remote transmission. She is aware of Dr. Olin Pia recommendations to come in for re-programming of her device.  She is agreeable with coming in office on 06/28/18 at 9:00 am.  Patient screened- documented in a separate encounter.

## 2018-06-24 NOTE — Telephone Encounter (Signed)
Patient scheduled for an in office visit with Dr. Caryl Comes on 06/28/18 at 9:00 am. She is aware to arrive at least 20 minutes early for screening process.   She is aware she will need to come in through [facility], no visitors, and she must wear a mask for the duration of her time in the building.  The patient is aware and voices understanding and is a agreeable.  Screening done.      COVID-19 Pre-Screening Questions:  . In the past 7 to 10 days have you had a cough,  shortness of breath, headache, congestion, fever (100 or greater) body aches, chills, sore throat, or sudden loss of taste or sense of smell? NO . Have you been around anyone with known Covid 19. NO . Have you been around anyone who is awaiting Covid 19 test results in the past 7 to 10 days? NO . Have you been around anyone who has been exposed to Covid 19, or has mentioned symptoms of Covid 19 within the past 7 to 10 days? NO  If you have any concerns/questions about symptoms patients report during screening (either on the phone or at threshold). Contact the provider seeing the patient or DOD for further guidance.  If neither are available contact a member of the leadership team.

## 2018-06-28 ENCOUNTER — Encounter: Payer: Self-pay | Admitting: Internal Medicine

## 2018-06-28 ENCOUNTER — Ambulatory Visit (INDEPENDENT_AMBULATORY_CARE_PROVIDER_SITE_OTHER): Payer: Medicare Other | Admitting: Internal Medicine

## 2018-06-28 ENCOUNTER — Other Ambulatory Visit: Payer: Self-pay

## 2018-06-28 VITALS — BP 138/80 | HR 74 | Ht 60.0 in | Wt 203.0 lb

## 2018-06-28 DIAGNOSIS — Z79899 Other long term (current) drug therapy: Secondary | ICD-10-CM | POA: Diagnosis not present

## 2018-06-28 DIAGNOSIS — Z95810 Presence of automatic (implantable) cardiac defibrillator: Secondary | ICD-10-CM | POA: Diagnosis not present

## 2018-06-28 DIAGNOSIS — I5022 Chronic systolic (congestive) heart failure: Secondary | ICD-10-CM | POA: Diagnosis not present

## 2018-06-28 DIAGNOSIS — I480 Paroxysmal atrial fibrillation: Secondary | ICD-10-CM

## 2018-06-28 DIAGNOSIS — I428 Other cardiomyopathies: Secondary | ICD-10-CM | POA: Diagnosis not present

## 2018-06-28 DIAGNOSIS — R748 Abnormal levels of other serum enzymes: Secondary | ICD-10-CM

## 2018-06-28 NOTE — Progress Notes (Signed)
Patient Care Team: Patient, No Pcp Per as PCP - General (General Practice) Delma Freeze, FNP as Nurse Practitioner (Cardiology) Duke Salvia, MD as Consulting Physician (Cardiology) Antonieta Iba, MD as Consulting Physician (Cardiology)   HPI  Erin [name] is a 73 y.o. female Seen in followup for ICD implanted in  the context of a nonischemic and ischemic cardiomyopathy.echocardiogram-- 5/14 EF 25%  She has chronic systolic heart failure related to  atrial fibrillation-paroxysmal. She took amiodarone and apixoban.  Amio was stopped 2/2 amio assoc hyperthyroidism--Rx with methimazole Saw Dr Everardo All         Date Cr Hgb TSH ALT  8/18   4.9 16  11/18    2.13   12/19  11.3 26>>6.04 (2/20)   5/20 1.35    56    DATE TEST EF   5/14 Echo  20-25%   8/16 Echo  25 % (44/2.1/35)m LAE  8/18 Echo  20-25%   8/18 Cath  T Cx///90% LAD>>DES  11/18 Echo   25-30%   She was hospitalized 8/18 following ICD discharge. The strips were reviewed. She had sustained ventricular tachycardia which failed with antitachycardia pacing. She also had nonsustained ventricular tachycardia below detection. Amiodarone re-initiated.    DAPT + apixoban; Now on Clopidigrel and apixoban  No bleeding  No syncope  Chronic dyspnea   No edema  No chest pain   Patient denies symptoms of GI intolerance, sun sensitivity, neurological symptoms attributable to amiodarone.        Past Medical History:  Diagnosis Date  . Anginal pain (HCC)   . Automatic implantable cardioverter-defibrillator in situ    a. s/p MDT ICD 07/2012; b. SN # [id number]   . CAD (coronary artery disease)    a. cath 2003: LM nl, mid LAD 50%, LCx nl, RCA nl b. L&RHC 08/17/2014 100% occluded mid LCx, 70% mid LAD with FFR 0.82. Medical therapy. CI 2.1. CO 4.01c. LHC 8/18: CTO LCx w/ R-L colatts, LAD s/p PCI/DES x 2  . Chronic systolic CHF (congestive heart failure) (HCC)    a. echo 2014: EF 25%, diffuse HK, LA moderately  dilated, RA mildly dilated, PASP 38 mm Hg; b. echo 08/2014: EF 25-30%, diffuse HK, RWMA cannot be excluded, mild MR, mild biatrial enlargement, RV mildly dilated, wall thickness nl, pacer wire/catheter noted, mild to mod TR, PASP high nl, c. TTE 8/18: EF 25-30%, diffuse HK, GR1DD, mod MR, mod dilated LA, nl RV sys fxn, PASP 56  . Encounter for long-term (current) use of anticoagulants   . Exertional shortness of breath   . H/O medication noncompliance   . High cholesterol   . Hypertension   . NICM (nonischemic cardiomyopathy) (HCC)    a. s/p AICD 07/22/2012  . Obesity   . OSA on CPAP    "suppose to; not often" (07/22/2012)  . Other "heavy-for-dates" infants   . PAF (paroxysmal atrial fibrillation) (HCC)    a. on warfarin--> Eliquis 08/2014    Past Surgical History:  Procedure Laterality Date  . ABDOMINAL HYSTERECTOMY  ~ 1998   "laparoscopic" (07/22/2012)  . CARDIAC CATHETERIZATION  ? 1990's  . CARDIAC CATHETERIZATION N/A 08/17/2014   Procedure: Right/Left Heart Cath and Coronary Angiography;  Surgeon: Kathleene Hazel, MD;  Location: [facility] INVASIVE CV LAB;  Service: Cardiovascular;  Laterality: N/A;  . CARDIAC DEFIBRILLATOR PLACEMENT  07/22/2012   dual-chamber ICD.  Marland Kitchen CORONARY STENT INTERVENTION N/A 08/24/2016   Procedure: CORONARY STENT INTERVENTION;  Surgeon: Wellington Hampshire, MD;  Location: [place] CV LAB;  Service: Cardiovascular;  Laterality: N/A;  . IMPLANTABLE CARDIOVERTER DEFIBRILLATOR IMPLANT N/A 07/22/2012   Procedure: IMPLANTABLE CARDIOVERTER DEFIBRILLATOR IMPLANT;  Surgeon: Evans Lance, MD;  Location: [facility] CATH LAB;  Service: Cardiovascular;  Laterality: N/A;  . LEFT HEART CATH AND CORONARY ANGIOGRAPHY N/A 08/24/2016   Procedure: LEFT HEART CATH AND CORONARY ANGIOGRAPHY;  Surgeon: Wellington Hampshire, MD;  Location: [place] CV LAB;  Service: Cardiovascular;  Laterality: N/A;  . TEE WITHOUT CARDIOVERSION N/A 08/21/2014   Procedure: TRANSESOPHAGEAL ECHOCARDIOGRAM (TEE);   Surgeon: Skeet Latch, MD;  Location: [facility] ENDOSCOPY;  Service: Cardiovascular;  Laterality: N/A;  . TUBAL LIGATION  ~ 1978    Current Outpatient Medications  Medication Sig Dispense Refill  . amiodarone (PACERONE) 200 MG tablet Take 1/2 tablet (100 mg) by mouth once daily    . apixaban (ELIQUIS) 5 MG TABS tablet Take 1 tablet (5 mg total) by mouth 2 (two) times daily. 60 tablet 11  . atorvastatin (LIPITOR) 40 MG tablet TAKE 1 TABLET BY MOUTH ONCE DAILY AT  6  PM 90 tablet 2  . carvedilol (COREG) 3.125 MG tablet TAKE 1 TABLET BY MOUTH TWICE DAILY WITH MEALS 180 tablet 0  . clopidogrel (PLAVIX) 75 MG tablet Take 1 tablet by mouth once daily with breakfast 90 tablet 1  . levothyroxine (SYNTHROID, LEVOTHROID) 88 MCG tablet Take 1 tablet (88 mcg total) by mouth daily before breakfast. 90 tablet 1  . losartan (COZAAR) 100 MG tablet Take 1 tablet (100 mg total) by mouth daily. 90 tablet 3  . spironolactone (ALDACTONE) 25 MG tablet Take 1/2 (one-half) tablet by mouth once daily 45 tablet 0   No current facility-administered medications for this visit.     No Known Allergies  Review of Systems negative except from HPI and PMH  Physical Exam BP 138/80 (BP Location: Left Arm, Patient Position: Sitting, Cuff Size: Normal)   Pulse 74   Ht 5' (1.524 m)   Wt 203 lb (92.1 kg)   BMI 39.65 kg/m  Well developed and well nourished in no acute distress HENT normal Neck supple  Device pocket well healed; without hematoma or erythema.  There is no tethering  Regular rate and rhythm, no  murmur Abd-soft   No Clubbing cyanosis  edema Skin-warm and dry A & Oriented  Grossly normal sensory and motor function  ECG sinus @ 74 17/08/44    Assessment and  Plan  Paroxysmal atrial fibrillation  Nonischemic cardiomyopathy  Coronary artery disease-2 vessel with recent stenting  Ventricular tachycardia-treated   Implantable defibrillator-Medtronic  Treated hyperthyroidism  Congestive heart  failure-acute/chronic-systolic  Hypertransaminasemia   No intercurrent atrial fibrillation or flutter  Without symptoms of ischemia  No intercurrent Ventricular tachycardia  On Anticoagulation;  No bleeding issues   Thyroid status being followed by Dr Loanne Drilling  With elevated ALT, will stop amiodarone and recheck in 6 weeks     We spent more than 50% of our >25 min visit in face to face counseling regarding the above

## 2018-06-28 NOTE — Progress Notes (Signed)
Patient Care Team: Patient, No Pcp Per as PCP - General (General Practice) Alisa Graff, FNP as Nurse Practitioner (Cardiology) Deboraha Sprang, MD as Consulting Physician (Cardiology) Minna Merritts, MD as Consulting Physician (Cardiology)   HPI  Erin [name] is a 73 y.o. female Seen in followup for ICD implanted in  the context of a nonischemic and ischemic cardiomyopathy.echocardiogram-- 5/14 EF 25%  She has chronic systolic heart failure related to  atrial fibrillation-paroxysmal. She took amiodarone and apixoban.  Amio was stopped 2/2 amio assoc hyperthyroidism--Rx with methimazole.. now levothyroxine Followed by Dr Loanne Drilling          Date Cr Hgb TSH ALT  8/18   4.9 16  11/18    2.13   12/19  11.3 26>>6.04 (2/20)   5/20 1.35    56    DATE TEST EF   5/14 Echo  20-25%   8/16 Echo  25 % (44/2.1/35)m LAE  8/18 Echo  20-25%   8/18 Cath  T Cx///90% LAD>>DES  11/18 Echo   25-30%   She was hospitalized 8/18 following ICD discharge. The strips were reviewed. She had sustained ventricular tachycardia which failed with antitachycardia pacing. She also had nonsustained ventricular tachycardia below detection. Amiodarone re-initiated.    DAPT + apixoban; Now on Clopidigrel and apixoban   No bleeding  The patient denies chest pain, nocturnal dyspnea, orthopnea or peripheral edema.  There have been no palpitations, lightheadedness or syncope Chronic dyspnea.    Patient denies symptoms of GI intolerance, sun sensitivity, neurological symptoms attributable to amiodarone.        Past Medical History:  Diagnosis Date  . Anginal pain ([place])   . Automatic implantable cardioverter-defibrillator in situ    a. s/p MDT ICD 07/2012; b. SN # [id number]   . CAD (coronary artery disease)    a. cath 2003: LM nl, mid LAD 50%, LCx nl, RCA nl b. L&RHC 08/17/2014 100% occluded mid LCx, 70% mid LAD with FFR 0.82. Medical therapy. CI 2.1. CO 4.01c. LHC 8/18: CTO LCx w/ R-L colatts, LAD  s/p PCI/DES x 2  . Chronic systolic CHF (congestive heart failure) ([place])    a. echo 2014: EF 25%, diffuse HK, LA moderately dilated, RA mildly dilated, PASP 38 mm Hg; b. echo 08/2014: EF 25-30%, diffuse HK, RWMA cannot be excluded, mild MR, mild biatrial enlargement, RV mildly dilated, wall thickness nl, pacer wire/catheter noted, mild to mod TR, PASP high nl, c. TTE 8/18: EF 25-30%, diffuse HK, GR1DD, mod MR, mod dilated LA, nl RV sys fxn, PASP 56  . Encounter for long-term (current) use of anticoagulants   . Exertional shortness of breath   . H/O medication noncompliance   . High cholesterol   . Hypertension   . NICM (nonischemic cardiomyopathy) ([place])    a. s/p AICD 07/22/2012  . Obesity   . OSA on CPAP    "suppose to; not often" (07/22/2012)  . Other "heavy-for-dates" infants   . PAF (paroxysmal atrial fibrillation) ([place])    a. on warfarin--> Eliquis 08/2014    Past Surgical History:  Procedure Laterality Date  . ABDOMINAL HYSTERECTOMY  ~ 1998   "laparoscopic" (07/22/2012)  . CARDIAC CATHETERIZATION  ? 1990's  . CARDIAC CATHETERIZATION N/A 08/17/2014   Procedure: Right/Left Heart Cath and Coronary Angiography;  Surgeon: Burnell Blanks, MD;  Location: [place] CV LAB;  Service: Cardiovascular;  Laterality: N/A;  . CARDIAC DEFIBRILLATOR PLACEMENT  07/22/2012   dual-chamber  ICD.  Marland Kitchen CORONARY STENT INTERVENTION N/A 08/24/2016   Procedure: CORONARY STENT INTERVENTION;  Surgeon: Iran Ouch, MD;  Location: [facility] INVASIVE CV LAB;  Service: Cardiovascular;  Laterality: N/A;  . IMPLANTABLE CARDIOVERTER DEFIBRILLATOR IMPLANT N/A 07/22/2012   Procedure: IMPLANTABLE CARDIOVERTER DEFIBRILLATOR IMPLANT;  Surgeon: Marinus Maw, MD;  Location: [facility] CATH LAB;  Service: Cardiovascular;  Laterality: N/A;  . LEFT HEART CATH AND CORONARY ANGIOGRAPHY N/A 08/24/2016   Procedure: LEFT HEART CATH AND CORONARY ANGIOGRAPHY;  Surgeon: Iran Ouch, MD;  Location: [facility] INVASIVE CV LAB;  Service:  Cardiovascular;  Laterality: N/A;  . TEE WITHOUT CARDIOVERSION N/A 08/21/2014   Procedure: TRANSESOPHAGEAL ECHOCARDIOGRAM (TEE);  Surgeon: Chilton Si, MD;  Location: [facility] ENDOSCOPY;  Service: Cardiovascular;  Laterality: N/A;  . TUBAL LIGATION  ~ 1978    Current Outpatient Medications  Medication Sig Dispense Refill  . amiodarone (PACERONE) 200 MG tablet Take 1/2 tablet (100 mg) by mouth once daily    . apixaban (ELIQUIS) 5 MG TABS tablet Take 1 tablet (5 mg total) by mouth 2 (two) times daily. 60 tablet 11  . atorvastatin (LIPITOR) 40 MG tablet TAKE 1 TABLET BY MOUTH ONCE DAILY AT  6  PM 90 tablet 2  . carvedilol (COREG) 3.125 MG tablet TAKE 1 TABLET BY MOUTH TWICE DAILY WITH MEALS 180 tablet 0  . clopidogrel (PLAVIX) 75 MG tablet Take 1 tablet by mouth once daily with breakfast 90 tablet 1  . levothyroxine (SYNTHROID, LEVOTHROID) 88 MCG tablet Take 1 tablet (88 mcg total) by mouth daily before breakfast. 90 tablet 1  . losartan (COZAAR) 100 MG tablet Take 1 tablet (100 mg total) by mouth daily. 90 tablet 3  . spironolactone (ALDACTONE) 25 MG tablet Take 1/2 (one-half) tablet by mouth once daily 45 tablet 0   No current facility-administered medications for this visit.     No Known Allergies  Review of Systems negative except from HPI and PMH  Physical Exam BP 138/80 (BP Location: Left Arm, Patient Position: Sitting, Cuff Size: Normal)   Pulse 74   Ht 5' (1.524 m)   Wt 203 lb (92.1 kg)   BMI 39.65 kg/m  Well developed and well nourished in no acute distress HENT normal Neck supple  Device pocket well healed; without hematoma or erythema.  There is no tethering  Regular rate and rhythm, no  murmur Abd-soft   No Clubbing cyanosis  edema Skin-warm and dry A & Oriented  Grossly normal sensory and motor function  ECG sinus @ 74 17/08/44    Assessment and  Plan  Paroxysmal atrial fibrillation  Nonischemic cardiomyopathy  Coronary artery disease-2 vessel with recent  stenting  Ventricular tachycardia-treated   Implantable defibrillator-Medtronic  Treated hyperthyroidism  Congestive heart failure-acute/chronic-systolic  Hypertransaminasemia   No intercurrent atrial fibrillation or flutter  Without symptoms of ischemia  No intercurrent Ventricular tachycardia  On Anticoagulation;  No bleeding issues   Thyroid status being followed by Dr Everardo All  With elevated ALT, will stop amiodarone and recheck in 6 weeks     We spent more than 50% of our >25 min visit in face to face counseling regarding the above

## 2018-06-28 NOTE — Patient Instructions (Addendum)
Medication Instructions:  - Your physician has recommended you make the following change in your medication:   1) Stop amiodarone  If you need a refill on your cardiac medications before your next appointment, please call your pharmacy.   Lab work: - Your physician recommends that you return for lab work in: 6 weeks- Liver function (please go to the [facility]- 1st desk on the right to check in Monday-Friday 7:30 am-5:00 pm) the week of 08/08/18   If you have labs (blood work) drawn today and your tests are completely normal, you will receive your results only by: Marland Kitchen MyChart Message (if you have MyChart) OR . A paper copy in the mail If you have any lab test that is abnormal or we need to change your treatment, we will call you to review the results.  Testing/Procedures: - none ordered  Follow-Up: At [facility], you and your health needs are our priority.  As part of our continuing mission to provide you with exceptional heart care, we have created designated Provider Care Teams.  These Care Teams include your primary Cardiologist (physician) and Advanced Practice Providers (APPs -  Physician Assistants and Nurse Practitioners) who all work together to provide you with the care you need, when you need it.  - You will need a follow up appointment in 1 year with Dr. Caryl Comes.  (June 2021) - Please call our office 2 months in advance to schedule this appointment.  (call in early April 2021 to schedule).  - Remote monitoring is used to monitor your Pacemaker of ICD from home. This monitoring reduces the number of office visits required to check your device to one time per year. It allows [place] to keep an eye on the functioning of your device to ensure it is working properly. You are scheduled for a device check from home on 09/06/2018. You may send your transmission at any time that day. If you have a wireless device, the transmission will be sent automatically. After your physician reviews your  transmission, you will receive a postcard with your next transmission date.   Any Other Special Instructions Will Be Listed Below (If Applicable). - N/A

## 2018-06-30 NOTE — Addendum Note (Signed)
Addended by: Raelene Bott, BRANDY L on: 06/30/2018 08:16 AM   Modules accepted: Orders

## 2018-06-30 NOTE — Addendum Note (Signed)
Addended by: Alvis Lemmings C on: 06/30/2018 10:49 AM   Modules accepted: Orders

## 2018-07-04 NOTE — Progress Notes (Signed)
Dr Caryl Comes referred patient to [facility] clinic.  Discussed with Dr Caryl Comes she was previously enrolled but remote transmissions were not received and calls were not returned.  No further ICM follow up at this time due to patient was not compliant with ICM monthly follow up.

## 2018-07-06 ENCOUNTER — Encounter: Payer: Self-pay | Admitting: Physician Assistant

## 2018-07-06 ENCOUNTER — Other Ambulatory Visit: Payer: Self-pay

## 2018-07-06 ENCOUNTER — Ambulatory Visit (INDEPENDENT_AMBULATORY_CARE_PROVIDER_SITE_OTHER): Payer: Medicare Other | Admitting: Physician Assistant

## 2018-07-06 ENCOUNTER — Telehealth: Payer: Self-pay | Admitting: Cardiovascular Disease

## 2018-07-06 VITALS — BP 116/70 | HR 79 | Ht 60.0 in | Wt 213.0 lb

## 2018-07-06 DIAGNOSIS — R740 Nonspecific elevation of levels of transaminase and lactic acid dehydrogenase [LDH]: Secondary | ICD-10-CM

## 2018-07-06 DIAGNOSIS — I472 Ventricular tachycardia, unspecified: Secondary | ICD-10-CM

## 2018-07-06 DIAGNOSIS — I5022 Chronic systolic (congestive) heart failure: Secondary | ICD-10-CM | POA: Diagnosis not present

## 2018-07-06 DIAGNOSIS — R7401 Elevation of levels of liver transaminase levels: Secondary | ICD-10-CM

## 2018-07-06 DIAGNOSIS — I428 Other cardiomyopathies: Secondary | ICD-10-CM

## 2018-07-06 DIAGNOSIS — I2510 Atherosclerotic heart disease of native coronary artery without angina pectoris: Secondary | ICD-10-CM

## 2018-07-06 DIAGNOSIS — R238 Other skin changes: Secondary | ICD-10-CM

## 2018-07-06 DIAGNOSIS — Z95810 Presence of automatic (implantable) cardiac defibrillator: Secondary | ICD-10-CM

## 2018-07-06 DIAGNOSIS — I480 Paroxysmal atrial fibrillation: Secondary | ICD-10-CM | POA: Diagnosis not present

## 2018-07-06 DIAGNOSIS — I10 Essential (primary) hypertension: Secondary | ICD-10-CM

## 2018-07-06 DIAGNOSIS — E785 Hyperlipidemia, unspecified: Secondary | ICD-10-CM

## 2018-07-06 DIAGNOSIS — I272 Pulmonary hypertension, unspecified: Secondary | ICD-10-CM

## 2018-07-06 DIAGNOSIS — G4733 Obstructive sleep apnea (adult) (pediatric): Secondary | ICD-10-CM

## 2018-07-06 MED ORDER — CEPHALEXIN 500 MG PO CAPS: 500.0000 mg | ORAL_CAPSULE | Freq: Two times a day (BID) | ORAL | 0 refills | Status: AC

## 2018-07-06 NOTE — Telephone Encounter (Signed)

## 2018-07-06 NOTE — Telephone Encounter (Signed)
Patient states she has had a place on her leg that is sore and is aching. States he legs feel tight  Pt c/o swelling: STAT is pt has developed SOB within 24 hours  1) How much weight have you gained and in what time span? Not a lot, maybe a few pounds  2) If swelling, where is the swelling located? Right leg  3) Are you currently taking a fluid pill?  yes  4) Are you currently SOB? no  Do you have a log of your daily weights (if so, list)? No log  5) Have you gained 3 pounds in a day or 5 pounds in a week? No   6) Have you traveled recently? no

## 2018-07-06 NOTE — Telephone Encounter (Signed)
Spoke with patient and she reports that she had a couple of places on her legs that they were getting sore and then yesterday when she got up they were like big blisters and now swollen. She does not have a PCP and saw Dr. Caryl Comes last week but didn't have this problem at that time. Reviewed importance of getting primary care provider to help manage her overall care. Instructed her to get some compression socks and to keep them elevated. Advised that I would get this to scheduling to see if we could get her in to see one of our APPs. She verbalized understanding with no further questions at this time.

## 2018-07-06 NOTE — Progress Notes (Signed)
Cardiology Office Note Date:  07/06/2018  Patient ID:  Erin [name], DOB 09/27/1945, MRN [id number] PCP:  Patient, No Pcp Per  Cardiologist:  Dr. Mariah MillingGollan, MD Electrophysiologist: Dr. Graciela HusbandsKlein, MD    Chief Complaint: Lower extremity swelling  History of Present Illness: Erin [name] is a 73 y.o. female with history of CAD as detailed below, HFrEF secondary to mixed nonischemic/ischemic cardiomyopathy with sustained VT status post [employer] ICD in 07/2012, PAF on Eliquis and previously amiodarone which was discontinued secondary to associated hyperthyroidism with subsequent resumption of amiodarone in the setting of VT followed by discontinuation of amiodarone most recently on 06/28/2018 secondary to elevated liver function testing, pulmonary hypertension, hyperthyroidism on methimazole, hypertension, hyperlipidemia, OSA noncompliant with CPAP, and medications as follow-up noncompliance who presents for evaluation of lower extremity swelling.  Prior LHC in 2003 showed normal left main, mid LAD 50% stenosed, LCx normal, RCA normal. R/LHC in 08/2014 showed 100% occluded mid LCx, mid LAD 70% with FFR 0.82, CI 2.1, CO 4.01, medical therapy was advised. Prior EF of 25% by echo in 05/2012. Hospitalized in 08/2014 with acute CHF in the context of Afib with RVR. Echo at that time demonstrated an EF of 25% and enlargement of the left atrium. She was transferred to [facility] where she underwent cardiac cath that demonstrated occlusion of the mid LCx and moderate LAD stenosis with a negative FFR as above. Her INR had been subtherapeutic, thus she underwent TEE prior to possible DCCV that showed a left atrial thrombus. Her Coumadin was transitioned to Eliquis during that admission.  Following this admission, she was lost to follow-up until 2018.  She was [facility] to [facility] in 08/2016 after reportedly running out of medications for the 3 weeks prior to her admission. Patient was at a grocery store when she felt a sudden  shock. ICD interrogation showed VT/VF lasting approximately 30 seconds terminated by defibrillation. Also one prior episode of VT lasting approximately 5 minutes without shock delivery. Case was discussed with EP and ICD device settings were changed appropriately. Transthoracic echocardiogram showed EF 25-30%, diffuse hypokinesis, grade 1 diastolic dysfunction, moderate mitral regurgitation, moderately dilated left atrium, normal RV systolic function, mildly dilated right atrium, mild to moderate tricuspid regurgitation, PASP 56. She underwent nuclear stress testing that showed anterior wall ischemia. This was followed by left heart catheterization on 08/24/2016 that showed significant 2 vessel coronary artery disease with chronically occluded left circumflex with right-to-left collaterals. Significant progression of mid to proximal diffuse LAD disease. She underwent successful PCI/DES with 2 overlapping stents placed to the mid LAD extending into the proximal segment. The proximal stent jailed a large D1 which had sluggish TIMI 2 flow. There was no significant ostial stenosis and most likely the sluggish flow was felt due to embolization.   More recently, she called on-call cardiology service in 04/2018 regarding volume overload with swelling in her legs with associated orthopnea requiring her to sleep in a recliner.  Her scale was noted to be broken.  Her diuretic was increased to 80 mg twice daily for 3 days followed by 40 mg twice daily thereafter.  In follow-up in 05/2018, via telemedicine, her weight had improved to 201 pounds which was down from 213 pounds at her last tele-visit in 04/2018.  In the setting her torsemide was decreased to 40 mg daily with 20 mg every other day after lunch with recommendation to hold torsemide for weight less than 200 pounds.  Note recorded by EP from device interrogation on  06/20/2018 showed "multiple episodes of VT both ATP terminated then VT declared as nonsustained because of  oscillations around VT detection.  The burden of VT was associated with CHF much improved."  It was recommended the patient come in for an office visit for device reprogramming.  Patient was seen by EP on 06/28/2018 with documented weight of 203 pounds (however this was not a standing weight and rather a reported weight of what the patient thought she was weighing at that time) and recommendation to discontinue amiodarone secondary to elevated ALT.  No intercurrent VT was documented.  Patient called this morning noting a blister that had developed along the lateral aspect of her right lower extremity and it intermittently drained serosanguineous fluid.  She denies any trauma to the area.  She comes in today doing well from a cardiac perspective.  She denies any chest pain, shortness of breath, dizziness, presyncope, or syncope.  She states the mild lower extremity swelling is baseline to improved for her.  She is no longer needing to sleep in a recliner and now sleeps in a bed with 2-3 pillow orthopnea.  She reports compliance with her diuretic regimen of 40 mg alternating with 60 mg daily.  Her weight today standing on our scale was 213 pounds though she does not recall what her most recent weight at home was and she also points out that her documented weight of 203 pounds from her visit here on 6/23 was actually a reported weight as outlined above.  Patient is documented weight from 12/07/2017 office visit with us noted to be 209 pounds.  She reports compliance with her Eliquis and denies any symptoms concerning for BRBPR or melena.    Past Medical History:  Diagnosis Date  . Anginal pain (HCC)   . Automatic implantable cardioverter-defibrillator in situ    a. s/p MDT ICD 07/2012; b. SN # [id number]   . CAD (coronary artery disease)    a. cath 2003: LM nl, mid LAD 50%, LCx nl, RCA nl b. L&RHC 08/17/2014 100% occluded mid LCx, 70% mid LAD with FFR 0.82. Medical therapy. CI 2.1. CO 4.01c. LHC 8/18: CTO  LCx w/ R-L colatts, LAD s/p PCI/DES x 2  . Chronic systolic CHF (congestive heart failure) (HCC)    a. echo 2014: EF 25%, diffuse HK, LA moderately dilated, RA mildly dilated, PASP 38 mm Hg; b. echo 08/2014: EF 25-30%, diffuse HK, RWMA cannot be excluded, mild MR, mild biatrial enlargement, RV mildly dilated, wall thickness nl, pacer wire/catheter noted, mild to mod TR, PASP high nl, c. TTE 8/18: EF 25-30%, diffuse HK, GR1DD, mod MR, mod dilated LA, nl RV sys fxn, PASP 56  . Encounter for long-term (current) use of anticoagulants   . Exertional shortness of breath   . H/O medication noncompliance   . High cholesterol   . Hypertension   . NICM (nonischemic cardiomyopathy) (HCC)    a. s/p AICD 07/22/2012  . Obesity   . OSA on CPAP    "suppose to; not often" (07/22/2012)  . Other "heavy-for-dates" infants   . PAF (paroxysmal atrial fibrillation) (HCC)    a. on warfarin--> Eliquis 08/2014    Past Surgical History:  Procedure Laterality Date  . ABDOMINAL HYSTERECTOMY  ~ 1998   "laparoscopic" (07/22/2012)  . CARDIAC CATHETERIZATION  ? 1990's  . CARDIAC CATHETERIZATION N/A 08/17/2014   Procedure: Right/Left Heart Cath and Coronary Angiography;  Surgeon: Kathleene Hazelhristopher D McAlhany, MD;  [facility];  Service: Cardiovascular;  Laterality: N/A;  . CARDIAC DEFIBRILLATOR PLACEMENT  07/22/2012   dual-chamber ICD.  Marland Kitchen. CORONARY STENT INTERVENTION N/A 08/24/2016   Procedure: CORONARY STENT INTERVENTION;  Surgeon: Iran OuchArida, Muhammad A, MD;  Location: [facility] INVASIVE CV LAB;  Service: Cardiovascular;  Laterality: N/A;  . IMPLANTABLE CARDIOVERTER DEFIBRILLATOR IMPLANT N/A 07/22/2012   Procedure: IMPLANTABLE CARDIOVERTER DEFIBRILLATOR IMPLANT;  Surgeon: Marinus MawGregg W Taylor, MD;  [facility];  Service: Cardiovascular;  Laterality: N/A;  . LEFT HEART CATH AND CORONARY ANGIOGRAPHY N/A 08/24/2016   Procedure: LEFT HEART CATH AND CORONARY ANGIOGRAPHY;  Surgeon: Iran OuchArida, Muhammad A, MD;  Location: [facility]  INVASIVE CV LAB;  Service: Cardiovascular;  Laterality: N/A;  . TEE WITHOUT CARDIOVERSION N/A 08/21/2014   Procedure: TRANSESOPHAGEAL ECHOCARDIOGRAM (TEE);  Surgeon: Chilton Siiffany Arlee, MD;  [facility];  Service: Cardiovascular;  Laterality: N/A;  . TUBAL LIGATION  ~ 1978    Current Meds  Medication Sig  . apixaban (ELIQUIS) 5 MG TABS tablet Take 1 tablet (5 mg total) by mouth 2 (two) times daily.  Marland Kitchen. atorvastatin (LIPITOR) 40 MG tablet TAKE 1 TABLET BY MOUTH ONCE DAILY AT  6  PM  . carvedilol (COREG) 3.125 MG tablet TAKE 1 TABLET BY MOUTH TWICE DAILY WITH MEALS  . clopidogrel (PLAVIX) 75 MG tablet Take 1 tablet by mouth once daily with breakfast  . levothyroxine (SYNTHROID, LEVOTHROID) 88 MCG tablet Take 1 tablet (88 mcg total) by mouth daily before breakfast.  . losartan (COZAAR) 100 MG tablet Take 1 tablet (100 mg total) by mouth daily.  Marland Kitchen. spironolactone (ALDACTONE) 25 MG tablet Take 1/2 (one-half) tablet by mouth once daily  . torsemide (DEMADEX) 20 MG tablet Take 20 mg by mouth as directed. Alternate 2 tablets daily with 3 tablets QOD    Allergies:   Patient has no known allergies.   Social History:  The patient  reports that she has never smoked. She has never used smokeless tobacco. She reports that she does not drink alcohol or use drugs.   Family History:  The patient's family history includes Diabetes in her sister; Heart disease in her father and mother; Heart failure in her brother; Lung disease in her sister.  ROS:   Review of Systems  Constitutional: Positive for malaise/fatigue. Negative for chills, diaphoresis, fever and weight loss.  HENT: Negative for congestion.   Eyes: Negative for discharge and redness.  Respiratory: Negative for cough, hemoptysis, sputum production, shortness of breath and wheezing.   Cardiovascular: Negative for chest pain, palpitations, orthopnea, claudication, leg swelling and PND.  Gastrointestinal: Negative for abdominal pain, blood  in stool, heartburn, melena, nausea and vomiting.  Genitourinary: Negative for hematuria.  Musculoskeletal: Negative for falls and myalgias.  Skin: Negative for rash.       Blister noted along the lateral aspect of the right lower extremity  Neurological: Negative for dizziness, tingling, tremors, sensory change, speech change, focal weakness, loss of consciousness and weakness.  Endo/Heme/Allergies: Does not bruise/bleed easily.  Psychiatric/Behavioral: Negative for substance abuse. The patient is not nervous/anxious.   All other systems reviewed and are negative.    PHYSICAL EXAM:  VS:  BP 116/70 (BP Location: Right Arm, Patient Position: Sitting, Cuff Size: Normal)   Pulse 79   Ht 5' (1.524 m)   Wt 213 lb (96.6 kg)   SpO2 96%   BMI 41.60 kg/m  BMI: Body mass index is 41.6 kg/m.  Physical Exam  Constitutional: She is oriented to person, place, and time. She appears well-developed and well-nourished.  HENT:  Head: Normocephalic and atraumatic.  Eyes: Right eye exhibits no discharge. Left eye exhibits no discharge.  Neck: Normal range of motion. No JVD present.  JVD difficult to assess secondary to body habitus  Cardiovascular: Normal rate, regular rhythm, S1 normal, S2 normal and normal heart sounds. Exam reveals no distant heart sounds, no friction rub, no midsystolic click and no opening snap.  No murmur heard. Pulses:      Posterior tibial pulses are 2+ on the right side and 2+ on the left side.  Pulmonary/Chest: Effort normal and breath sounds normal. No respiratory distress. She has no decreased breath sounds. She has no wheezes. She has no rales. She exhibits no tenderness.  Abdominal: Soft. She exhibits no distension. There is no abdominal tenderness.  Musculoskeletal:        General: Edema present.     Comments: Trivial bilateral pretibial edema  Neurological: She is alert and oriented to person, place, and time.  Skin: Skin is warm and dry. No cyanosis. Nails show no  clubbing.     Psychiatric: She has a normal mood and affect. Her speech is normal and behavior is normal. Judgment and thought content normal.     EKG:  Was ordered and interpreted by me today. Shows NSR, 79 bpm, low voltage QRS, cannot exclude prior anterior infarct, nonspecific ST-T changes (no significant changes compared to prior study)  Recent Labs: 12/07/2017: Hemoglobin 11.3; Platelets 409 02/15/2018: TSH 6.06 05/09/2018: ALT 56; BUN 35; Creatinine, Ser 1.35; Potassium 4.2; Sodium 136  No results found for requested labs within last 8760 hours.   CrCl cannot be calculated (Patient's most recent lab result is older than the maximum 21 days allowed.).   Wt Readings from Last 3 Encounters:  07/06/18 213 lb (96.6 kg)  06/28/18 203 lb (92.1 kg)  05/17/18 201 lb (91.2 kg)     Other studies reviewed: Additional studies/records reviewed today include: summarized above  ASSESSMENT AND PLAN:  1. Lower extremity bulla/possible cellulitis: Patient presented with a 3-day history of serosanguineous filled blister/bulla along the lateral aspect of the right lower extremity with mild surrounding warmth and erythema.  Otherwise, there is only trivial pretibial edema bilaterally which the patient states is at her baseline.  She does not appear grossly volume overloaded.  We will place the patient on Keflex 500 mg twice daily for 7 days, check CBC and BMP.  She has been advised to follow-up with her PCP first part of next week.  She was given strict ED precautions.  2. Chronic HFrEF secondary to mixed ischemic and nonischemic cardiomyopathy: She appears grossly euvolemic and well compensated today.  At first glance it would appear her weight is up 10 pounds when compared to her last office visit however she indicated to me that her weight from 6/23 was a patient reported weight and not a standing weight.  The patient states that is what she thought she should weight at that time though was not  recalling the weight of 203 pounds from a specific timeframe.  Her weight was documented to be 209 pounds on 12/07/2017 in our office.  In this setting, I doubt she is significantly volume overload.  She has been unable to afford Entresto, and thus has been maintained on losartan along with carvedilol and spironolactone.  Continue current dose of torsemide 40 mg daily alternating with 60 mg daily.  3. CAD involving the native coronary arteries without angina: No symptoms concerning for angina.  Remains on request and Plavix without  symptoms of bleeding, presumably in the setting of overlapping stents in the LAD.  Check CBC as outlined above.  No plans for ischemic evaluation at this time.  4. VT: Status post ICD.  Monitored by electrophysiology.  Amiodarone discontinued secondary to elevated ALT by EP.  Continue to follow-up with EP as directed.  5. PAF: Followed by EP.  Remains on Coreg and Eliquis.  No bleeding concerns.  Most recent CBC on 12/2017 demonstrated stable hemoglobin.  6. Pulmonary hypertension: Symptoms of shortness of breath have significantly improved on current diuretic regimen.  7. Hypertension: Blood pressure is well controlled today.  Continue current medications as outlined above.  8. Hyperlipidemia: Continue atorvastatin 40 mg daily for now pending trending of liver function testing later this summer.  No recent lipid panel.  Goal LDL less than 70.  9. Sleep apnea: Recommend compliance with CPAP.  10. Transaminitis: Amiodarone discontinued by EP.  She has follow-up liver function testing scheduled for later this summer per EP.  11. Hyperthyroidism/hypothyroidism: Felt to be in the setting of amiodarone use.  Status post methimazole.  Currently on levothyroxine.  Discussed with Dr. Saunders Revel.  Disposition: F/u with Dr. Rockey Situ or an APP in 3 months and EP as directed.  Current medicines are reviewed at length with the patient today.  The patient did not have any concerns  regarding medicines.  Signed, Christell Faith, PA-C 07/06/2018 2:11 PM     [address] [place] [address] [place], [place] [address] [phone]

## 2018-07-06 NOTE — Telephone Encounter (Signed)
Coming today    Add on Ryan at 130 fyi

## 2018-07-06 NOTE — Patient Instructions (Signed)
Medication Instructions:  Your physician has recommended you make the following change in your medication:  1- START Keflex Take 1 capsule (500 mg total) by mouth 2 (two) times daily for 7 days If you need a refill on your cardiac medications before your next appointment, please call your pharmacy.   Lab work: Your physician recommends that you have lab work today(CBC, BMET)   If you have labs (blood work) drawn today and your tests are completely normal, you will receive your results only by: Marland Kitchen MyChart Message (if you have MyChart) OR . A paper copy in the mail If you have any lab test that is abnormal or we need to change your treatment, we will call you to review the results.  Testing/Procedures: None ordered   Follow-Up: At [facility], you and your health needs are our priority.  As part of our continuing mission to provide you with exceptional heart care, we have created designated Provider Care Teams.  These Care Teams include your primary Cardiologist (physician) and Advanced Practice Providers (APPs -  Physician Assistants and Nurse Practitioners) who all work together to provide you with the care you need, when you need it. You will need a follow up appointment in 3 months.  You may see Dr. Rockey Situ or Christell Faith, PA-C.    Any Other Special Instructions Will Be Listed Below (If Applicable). Weigh yourself when you get home today and weigh yourself tomorrow am. Please call the office and report the 2.   Please call your PCP for lower extremity infection.   If you begin having fever, chills, dizziness or rash increases in size, please seek medical attention.

## 2018-07-06 NOTE — Telephone Encounter (Signed)
Spoke with patient to review upcoming appointments. She has appointment with provider for thyroid and recommended that she also show him her legs as well. We have her scheduled to see Dr. Rockey Situ on the 14th and that I would have scheduling review to see if they have another appointment sooner. Recommended that she please call us back if this should get worse but that someone should be in touch to see if another appointment is possible.

## 2018-07-07 ENCOUNTER — Telehealth: Payer: Self-pay

## 2018-07-07 ENCOUNTER — Other Ambulatory Visit: Payer: Self-pay

## 2018-07-07 LAB — CBC
Hematocrit: 34.6 % (ref 34.0–46.6)
Hemoglobin: 10.6 g/dL — ABNORMAL LOW (ref 11.1–15.9)
MCH: 22.1 pg — ABNORMAL LOW (ref 26.6–33.0)
MCHC: 30.6 g/dL — ABNORMAL LOW (ref 31.5–35.7)
MCV: 72 fL — ABNORMAL LOW (ref 79–97)
Platelets: 322 10*3/uL (ref 150–450)
RBC: 4.8 x10E6/uL (ref 3.77–5.28)
RDW: 25.4 % — ABNORMAL HIGH (ref 11.7–15.4)
WBC: 7.4 10*3/uL (ref 3.4–10.8)

## 2018-07-07 LAB — BASIC METABOLIC PANEL
BUN/Creatinine Ratio: 23 (ref 12–28)
BUN: 39 mg/dL — ABNORMAL HIGH (ref 8–27)
CO2: 24 mmol/L (ref 20–29)
Calcium: 9.5 mg/dL (ref 8.7–10.3)
Chloride: 97 mmol/L (ref 96–106)
Creatinine, Ser: 1.66 mg/dL — ABNORMAL HIGH (ref 0.57–1.00)
GFR calc Af Amer: 35 mL/min/{1.73_m2} — ABNORMAL LOW (ref >59)
GFR calc non Af Amer: 30 mL/min/{1.73_m2} — ABNORMAL LOW (ref >59)
Glucose: 100 mg/dL — ABNORMAL HIGH (ref 65–99)
Potassium: 4.7 mmol/L (ref 3.5–5.2)
Sodium: 137 mmol/L (ref 134–144)

## 2018-07-07 MED ORDER — TORSEMIDE 20 MG PO TABS: 40.0000 mg | ORAL_TABLET | Freq: Every day | ORAL | Status: DC

## 2018-07-07 NOTE — Telephone Encounter (Signed)
Pt made aware of lab results and Ryan's recommendation. Pt sts that she has not established with a pcp and would like Ryan's recommendation. Adv the pt that I will fwd Ryan the update.

## 2018-07-07 NOTE — Telephone Encounter (Signed)
-----   Message from Rise Mu, PA-C sent at 07/07/2018  7:52 AM EDT ----- White blood cell count normal.   Hemoglobin slightly low. Renal function slightly worse than her baseline.  Recommend she reduce torsemide back to 40 mg daily and follow-up with PCP regarding underlying anemia.

## 2018-07-07 NOTE — Telephone Encounter (Signed)
Pt made aware of lab results and Ryan's recommendation. Pt sts that she has not established with a pcp and would like Ryan's recommendation. Adv the pt that I will fwd Ryan the update. 

## 2018-07-07 NOTE — Telephone Encounter (Signed)
Called to give pt lab results and Ryan's recommendation. [email or web address] 

## 2018-07-09 ENCOUNTER — Encounter: Payer: Self-pay | Admitting: Emergency Medicine

## 2018-07-09 ENCOUNTER — Emergency Department
Admission: EM | Admit: 2018-07-09 | Discharge: 2018-07-09 | Disposition: A | Discharge status: Home / Self Care | Payer: Medicare Other | Attending: Emergency Medicine | Admitting: Emergency Medicine

## 2018-07-09 ENCOUNTER — Emergency Department: Payer: Medicare Other

## 2018-07-09 ENCOUNTER — Other Ambulatory Visit: Payer: Self-pay

## 2018-07-09 DIAGNOSIS — Z79899 Other long term (current) drug therapy: Secondary | ICD-10-CM | POA: Insufficient documentation

## 2018-07-09 DIAGNOSIS — R2241 Localized swelling, mass and lump, right lower limb: Secondary | ICD-10-CM | POA: Insufficient documentation

## 2018-07-09 DIAGNOSIS — E039 Hypothyroidism, unspecified: Secondary | ICD-10-CM | POA: Insufficient documentation

## 2018-07-09 DIAGNOSIS — N183 Chronic kidney disease, stage 3 (moderate): Secondary | ICD-10-CM | POA: Diagnosis not present

## 2018-07-09 DIAGNOSIS — I5022 Chronic systolic (congestive) heart failure: Secondary | ICD-10-CM | POA: Diagnosis not present

## 2018-07-09 DIAGNOSIS — Z7901 Long term (current) use of anticoagulants: Secondary | ICD-10-CM | POA: Diagnosis not present

## 2018-07-09 DIAGNOSIS — R238 Other skin changes: Secondary | ICD-10-CM | POA: Diagnosis not present

## 2018-07-09 DIAGNOSIS — L139 Bullous disorder, unspecified: Secondary | ICD-10-CM | POA: Diagnosis present

## 2018-07-09 DIAGNOSIS — I130 Hypertensive heart and chronic kidney disease with heart failure and stage 1 through stage 4 chronic kidney disease, or unspecified chronic kidney disease: Secondary | ICD-10-CM | POA: Diagnosis not present

## 2018-07-09 DIAGNOSIS — M79606 Pain in leg, unspecified: Secondary | ICD-10-CM

## 2018-07-09 LAB — COMPREHENSIVE METABOLIC PANEL
ALT: 43 U/L (ref 0–44)
AST: 52 U/L — ABNORMAL HIGH (ref 15–41)
Albumin: 4 g/dL (ref 3.5–5.0)
Alkaline Phosphatase: 83 U/L (ref 38–126)
Anion gap: 10 (ref 5–15)
BUN: 40 mg/dL — ABNORMAL HIGH (ref 8–23)
CO2: 29 mmol/L (ref 22–32)
Calcium: 9.1 mg/dL (ref 8.9–10.3)
Chloride: 97 mmol/L — ABNORMAL LOW (ref 98–111)
Creatinine, Ser: 1.49 mg/dL — ABNORMAL HIGH (ref 0.44–1.00)
GFR calc Af Amer: 40 mL/min — ABNORMAL LOW (ref >60)
GFR calc non Af Amer: 34 mL/min — ABNORMAL LOW (ref >60)
Glucose, Bld: 100 mg/dL — ABNORMAL HIGH (ref 70–99)
Potassium: 4.5 mmol/L (ref 3.5–5.1)
Sodium: 136 mmol/L (ref 135–145)
Total Bilirubin: 1 mg/dL (ref 0.3–1.2)
Total Protein: 8.3 g/dL — ABNORMAL HIGH (ref 6.5–8.1)

## 2018-07-09 LAB — CBC WITH DIFFERENTIAL/PLATELET
Abs Immature Granulocytes: 0.02 10*3/uL (ref 0.00–0.07)
Basophils Absolute: 0.1 10*3/uL (ref 0.0–0.1)
Basophils Relative: 1 %
Eosinophils Absolute: 0.4 10*3/uL (ref 0.0–0.5)
Eosinophils Relative: 5 %
HCT: 35.2 % — ABNORMAL LOW (ref 36.0–46.0)
Hemoglobin: 10.8 g/dL — ABNORMAL LOW (ref 12.0–15.0)
Immature Granulocytes: 0 %
Lymphocytes Relative: 27 %
Lymphs Abs: 1.9 10*3/uL (ref 0.7–4.0)
MCH: 22.1 pg — ABNORMAL LOW (ref 26.0–34.0)
MCHC: 30.7 g/dL (ref 30.0–36.0)
MCV: 72 fL — ABNORMAL LOW (ref 80.0–100.0)
Monocytes Absolute: 1.5 10*3/uL — ABNORMAL HIGH (ref 0.1–1.0)
Monocytes Relative: 21 %
Neutro Abs: 3.2 10*3/uL (ref 1.7–7.7)
Neutrophils Relative %: 46 %
Platelets: 310 10*3/uL (ref 150–400)
RBC: 4.89 MIL/uL (ref 3.87–5.11)
RDW: 25.8 % — ABNORMAL HIGH (ref 11.5–15.5)
Smear Review: NORMAL
WBC: 7 10*3/uL (ref 4.0–10.5)
nRBC: 0 % (ref 0.0–0.2)

## 2018-07-09 LAB — LACTIC ACID, PLASMA: Lactic Acid, Venous: 1.4 mmol/L (ref 0.5–1.9)

## 2018-07-09 LAB — BRAIN NATRIURETIC PEPTIDE: B Natriuretic Peptide: 148 pg/mL — ABNORMAL HIGH (ref 0.0–100.0)

## 2018-07-09 NOTE — ED Triage Notes (Signed)
Patient presents to the ED with redness, swelling, and wounds to her right leg.  Patient states she has been dealing with similar symptoms since March/April.  Patient states she noticed significant swelling last night and a new blister with pain.  Patient is in no obvious distress at this time.

## 2018-07-09 NOTE — ED Provider Notes (Signed)
[facility] Emergency Department Provider Note  ____________________________________________  Time seen: Approximately 1:58 PM  I have reviewed the triage vital signs and the nursing notes.   HISTORY  Chief Complaint Leg Problem    HPI Erin BowensJulia M Tith is a 73 y.o. female who presents the emergency department for evaluation of bullous lesion to the right lower extremity.  Patient reports that this lesion appeared approximately 6 days ago.  Patient was seen by her cardiologist group 3 days prior with evaluation of this lesion.  At that time, she was started on antibiotics and had basic labs drawn.  She states that she was given strict precautions to present to the emergency department if there was no improvement.  She reports that cardiology stated that if things were not improving she needed an ultrasound of her right lower extremity.  Patient has a past medical history of coronary artery disease, chronic systolic CHF, hypercholesterolemia, hypertension, cardiomyopathy, sleep apnea, paroxysmal atrial fibrillation.  Patient reports that her cardiology concerns are stable.  Patient reports pain under the bullous lesion.  She denies any significant changes in edema of her right lower extremity.  No fevers or chills.  Patient is taking her prescribed antibiotics as directed.         Past Medical History:  Diagnosis Date  . Anginal pain (HCC)   . Automatic implantable cardioverter-defibrillator in situ    a. s/p MDT ICD 07/2012; b. SN # PJN [id number]   . CAD (coronary artery disease)    a. cath 2003: LM nl, mid LAD 50%, LCx nl, RCA nl b. L&RHC 08/17/2014 100% occluded mid LCx, 70% mid LAD with FFR 0.82. Medical therapy. CI 2.1. CO 4.01c. LHC 8/18: CTO LCx w/ R-L colatts, LAD s/p PCI/DES x 2  . Chronic systolic CHF (congestive heart failure) (HCC)    a. echo 2014: EF 25%, diffuse HK, LA moderately dilated, RA mildly dilated, PASP 38 mm Hg; b. echo 08/2014: EF 25-30%, diffuse  HK, RWMA cannot be excluded, mild MR, mild biatrial enlargement, RV mildly dilated, wall thickness nl, pacer wire/catheter noted, mild to mod TR, PASP high nl, c. TTE 8/18: EF 25-30%, diffuse HK, GR1DD, mod MR, mod dilated LA, nl RV sys fxn, PASP 56  . Encounter for long-term (current) use of anticoagulants   . Exertional shortness of breath   . H/O medication noncompliance   . High cholesterol   . Hypertension   . NICM (nonischemic cardiomyopathy) (HCC)    a. s/p AICD 07/22/2012  . Obesity   . OSA on CPAP    "suppose to; not often" (07/22/2012)  . Other "heavy-for-dates" infants   . PAF (paroxysmal atrial fibrillation) (HCC)    a. on warfarin--> Eliquis 08/2014    Patient Active Problem List   Diagnosis Date Noted  . High risk medication use 06/28/2018  . Hypothyroidism due to amiodarone 02/15/2018  . Unstable angina (HCC)   . Ventricular tachycardia (HCC) 08/20/2016  . Ventricular fibrillation (HCC) 08/19/2016  . CKD (chronic kidney disease) stage 3, GFR 30-59 ml/min (HCC) 08/19/2016  . Chronic systolic heart failure (HCC) 08/30/2014  . Atherosclerosis of native coronary artery of native heart with stable angina pectoris (HCC)   . Anemia 08/15/2014  . H/O medication noncompliance   . PAF (paroxysmal atrial fibrillation) (HCC)   . Atrial fibrillation with RVR (HCC) 08/13/2014  . Chronic systolic CHF (congestive heart failure) (HCC) 09/30/2012  . Essential hypertension 08/24/2012  . NICM (nonischemic cardiomyopathy) (HCC) 07/23/2012  . AICD (  automatic cardioverter/defibrillator) present 07/23/2012  . Long term current use of anticoagulant therapy 07/23/2010  . Complex sleep apnea syndrome 11/20/2007  . CORONARY ATHEROSCLEROSIS NATIVE CORONARY ARTERY 11/20/2007  . Pulmonary HTN (HCC) 11/20/2007    Past Surgical History:  Procedure Laterality Date  . ABDOMINAL HYSTERECTOMY  ~ 1998   "laparoscopic" (07/22/2012)  . CARDIAC CATHETERIZATION  ? 1990's  . CARDIAC CATHETERIZATION N/A  08/17/2014   Procedure: Right/Left Heart Cath and Coronary Angiography;  Surgeon: Kathleene Hazelhristopher D McAlhany, MD;  [facility];  Service: Cardiovascular;  Laterality: N/A;  . CARDIAC DEFIBRILLATOR PLACEMENT  07/22/2012   dual-chamber ICD.  Marland Kitchen. CORONARY STENT INTERVENTION N/A 08/24/2016   Procedure: CORONARY STENT INTERVENTION;  Surgeon: Iran OuchArida, Muhammad A, MD;  Location: [facility] INVASIVE CV LAB;  Service: Cardiovascular;  Laterality: N/A;  . IMPLANTABLE CARDIOVERTER DEFIBRILLATOR IMPLANT N/A 07/22/2012   Procedure: IMPLANTABLE CARDIOVERTER DEFIBRILLATOR IMPLANT;  Surgeon: Marinus MawGregg W Taylor, MD;  [facility];  Service: Cardiovascular;  Laterality: N/A;  . LEFT HEART CATH AND CORONARY ANGIOGRAPHY N/A 08/24/2016   Procedure: LEFT HEART CATH AND CORONARY ANGIOGRAPHY;  Surgeon: Iran OuchArida, Muhammad A, MD;  Location: [facility] INVASIVE CV LAB;  Service: Cardiovascular;  Laterality: N/A;  . TEE WITHOUT CARDIOVERSION N/A 08/21/2014   Procedure: TRANSESOPHAGEAL ECHOCARDIOGRAM (TEE);  Surgeon: Chilton Siiffany Gillespie, MD;  [facility];  Service: Cardiovascular;  Laterality: N/A;  . TUBAL LIGATION  ~ 1978    Prior to Admission medications   Medication Sig Start Date End Date Taking? Authorizing Provider  apixaban (ELIQUIS) 5 MG TABS tablet Take 1 tablet (5 mg total) by mouth 2 (two) times daily. 12/07/17   Antonieta IbaGollan, Timothy J, MD  atorvastatin (LIPITOR) 40 MG tablet TAKE 1 TABLET BY MOUTH ONCE DAILY AT  6  PM 05/07/17   Duke SalviaKlein, Steven C, MD  carvedilol (COREG) 3.125 MG tablet TAKE 1 TABLET BY MOUTH TWICE DAILY WITH MEALS 06/24/18   Duke SalviaKlein, Steven C, MD  cephALEXin (KEFLEX) 500 MG capsule Take 1 capsule (500 mg total) by mouth 2 (two) times daily for 7 days. 07/06/18 07/13/18  Sondra Bargesunn, Ryan M, PA-C  clopidogrel (PLAVIX) 75 MG tablet Take 1 tablet by mouth once daily with breakfast 04/01/18   Antonieta IbaGollan, Timothy J, MD  levothyroxine (SYNTHROID, LEVOTHROID) 88 MCG tablet Take 1 tablet (88 mcg total) by mouth daily before  breakfast. 02/15/18   Romero BellingEllison, [name], MD  losartan (COZAAR) 100 MG tablet Take 1 tablet (100 mg total) by mouth daily. 04/25/18 07/24/18  Antonieta IbaGollan, Timothy J, MD  spironolactone (ALDACTONE) 25 MG tablet Take 1/2 (one-half) tablet by mouth once daily 06/24/18   Duke SalviaKlein, Steven C, MD  torsemide (DEMADEX) 20 MG tablet Take 2 tablets (40 mg total) by mouth daily. 07/07/18   Sondra Bargesunn, Ryan M, PA-C    Allergies Patient has no known allergies.  Family History  Problem Relation Age of Onset  . Heart disease Mother   . Heart disease Father   . Lung disease Sister   . Diabetes Sister   . Heart failure Brother   . Thyroid disease Neg Hx     Social History Social History   Tobacco Use  . Smoking status: Never Smoker  . Smokeless tobacco: Never Used  Substance Use Topics  . Alcohol use: No  . Drug use: No     Review of Systems  Constitutional: No fever/chills Eyes: No visual changes. No discharge ENT: No upper respiratory complaints. Cardiovascular: no chest pain. Respiratory: no cough. No SOB. Gastrointestinal: No abdominal pain.  No nausea, no vomiting.  Musculoskeletal: Negative for musculoskeletal pain. Skin: Positive for bullous lesion to the right lower extremity. Neurological: Negative for headaches, focal weakness or numbness. 10-point ROS otherwise negative.  ____________________________________________   PHYSICAL EXAM:  VITAL SIGNS: ED Triage Vitals  Enc Vitals Group     BP 07/09/18 1321 (!) 108/53     Pulse Rate 07/09/18 1321 87     Resp 07/09/18 1321 16     Temp 07/09/18 1321 98.7 F (37.1 C)     Temp Source 07/09/18 1321 Oral     SpO2 07/09/18 1321 95 %     Weight 07/09/18 1322 213 lb (96.6 kg)     Height 07/09/18 1322 5' (1.524 m)     Head Circumference --      Peak Flow --      Pain Score 07/09/18 1321 0     Pain Loc --      Pain Edu? --      Excl. in GC? --      Constitutional: Alert and oriented. Well appearing and in no acute distress. Eyes: Conjunctivae are  normal. PERRL. EOMI. Head: Atraumatic. Neck: No stridor.    Cardiovascular: Normal rate, regular rhythm. Normal S1 and S2.  Good peripheral circulation. Respiratory: Normal respiratory effort without tachypnea or retractions. Lungs CTAB. Good air entry to the bases with no decreased or absent breath sounds. Musculoskeletal: Full range of motion to all extremities. No gross deformities appreciated.  Visualization of the right lower extremity reveals chronic skin ulcerations consistent with venous stasis ulcers.  Patient also has a bullous lesion leaking serosanguineous fluid from the right lateral distal aspect of the tib-fib region.  No surrounding erythema or edema.  Area is moderately tender to palpation.  Patient is tender to palpation over the calf adjacent to this lesion.  No tenderness over the proximal calf.  Patient has mild bilateral lower extremity edema, no significant pitting edema appreciated.  Dorsalis pedis pulse intact bilateral lower extremities.  No gross difference between pulses and lower extremity.  Sensation intact distally. Neurologic:  Normal speech and language. No gross focal neurologic deficits are appreciated.  Skin:  Skin is warm, dry and intact. No rash noted. Psychiatric: Mood and affect are normal. Speech and behavior are normal. Patient exhibits appropriate insight and judgement.   ____________________________________________   LABS (all labs ordered are listed, but only abnormal results are displayed)  Labs Reviewed  COMPREHENSIVE METABOLIC PANEL - Abnormal; Notable for the following components:      Result Value   Chloride 97 (*)    Glucose, Bld 100 (*)    BUN 40 (*)    Creatinine, Ser 1.49 (*)    Total Protein 8.3 (*)    AST 52 (*)    GFR calc non Af Amer 34 (*)    GFR calc Af Amer 40 (*)    All other components within normal limits  BRAIN NATRIURETIC PEPTIDE - Abnormal; Notable for the following components:   B Natriuretic Peptide 148.0 (*)    All  other components within normal limits  CBC WITH DIFFERENTIAL/PLATELET - Abnormal; Notable for the following components:   Hemoglobin 10.8 (*)    HCT 35.2 (*)    MCV 72.0 (*)    MCH 22.1 (*)    RDW 25.8 (*)    Monocytes Absolute 1.5 (*)    All other components within normal limits  LACTIC ACID, PLASMA  URINALYSIS, COMPLETE (UACMP) WITH MICROSCOPIC   ____________________________________________  EKG  ____________________________________________  RADIOLOGY I personally viewed and evaluated these images as part of my medical decision making, as well as reviewing the written report by the radiologist.  US Venous Img Lower Unilateral Right  Result Date: 07/09/2018 CLINICAL DATA:  Bullous lesion in the right lower extremity with chronic edema. EXAM: RIGHT LOWER EXTREMITY VENOUS DOPPLER ULTRASOUND TECHNIQUE: Gray-scale sonography with graded compression, as well as color Doppler and duplex ultrasound were performed to evaluate the lower extremity deep venous systems from the level of the common femoral vein and including the common femoral, femoral, profunda femoral, popliteal and calf veins including the posterior tibial, peroneal and gastrocnemius veins when visible. The superficial great saphenous vein was also interrogated. Spectral Doppler was utilized to evaluate flow at rest and with distal augmentation maneuvers in the common femoral, femoral and popliteal veins. COMPARISON:  None. FINDINGS: Contralateral Common Femoral Vein: Respiratory phasicity is normal and symmetric with the symptomatic side. No evidence of thrombus. Normal compressibility. Common Femoral Vein: No evidence of thrombus. Normal compressibility, respiratory phasicity and response to augmentation. Saphenofemoral Junction: No evidence of thrombus. Normal compressibility and flow on color Doppler imaging. Profunda Femoral Vein: No evidence of thrombus. Normal compressibility and flow on color Doppler imaging. Femoral Vein: No  evidence of thrombus. Normal compressibility, respiratory phasicity and response to augmentation. Popliteal Vein: No evidence of thrombus. Normal compressibility, respiratory phasicity and response to augmentation. Calf Veins: No evidence of thrombus. Normal compressibility and flow on color Doppler imaging. Superficial Great Saphenous Vein: No evidence of thrombus. Normal compressibility. Venous Reflux:  None. Other Findings:  None. IMPRESSION: No evidence of deep venous thrombosis in the right lower extremity. Electronically Signed   By: Ilona Sorrel M.D.   On: 07/09/2018 15:37    ____________________________________________    PROCEDURES  Procedure(s) performed:    Procedures    Medications - No data to display   ____________________________________________   INITIAL IMPRESSION / ASSESSMENT AND PLAN / ED COURSE  Pertinent labs & imaging results that were available during my care of the patient were reviewed by me and considered in my medical decision making (see chart for details).  Review of the [place] CSRS was performed in accordance of the [place] prior to dispensing any controlled drugs.  Clinical Course as of Jul 08 1745  Sat Jul 09, 2018  1405 Patient presented to the emergency department for evaluation of a bullous lesion to the right lower extremity.  Patient has been evaluated by her cardiologist 3 days ago.  At that time she was started on antibiotics, apparently more prophylactically than anything.  Patient was advised to follow-up in the emergency department if there was no improvement after 2 to 3 days.  Patient reports that there is been no significant improvement meant of the lesion.  She denies any other complaint other than evaluation of her lesion.  Patient reports that her cardiologist mention that she needed ultrasound if things did not improve.  At this time, bullous lesion is appreciated.  No gross surrounding erythema or edema concerning for significant cellulitis.  I  have a low suspicion for DVT at this time, however as patient is insistent that cardiologist was concerned, I will evaluate for DVT with ultrasound.  Basic labs were also ordered at this time.   [JC]    Clinical Course User Index [JC] Selyna Klahn, Charline Bills, PA-C          Patient's diagnosis is consistent with bullous skin lesion, chronic systolic congestive heart failure.  Patient presented to  the emergency department complaining of blistered skin lesion to the right lower extremity.  Patient had been seen by cardiology 3 days prior.  Started on antibiotics for surrounding cellulitis.  No evidence of infection on exam today.  Patient was concerned as area had not fully healed.  Labs and ultrasound is reassuring at this time.  No indication for further work-up.  It appears that cellulitis has been improving.  Given wound care instructions to the patient.  Follow-up with primary care or return to cardiology as needed..  Patient is given ED precautions to return to the ED for any worsening or new symptoms.     ____________________________________________  FINAL CLINICAL IMPRESSION(S) / ED DIAGNOSES  Final diagnoses:  Bullous lesion  Chronic systolic congestive heart failure (HCC)      NEW MEDICATIONS STARTED DURING THIS VISIT:  ED Discharge Orders    None          This chart was dictated using voice recognition software/Dragon. Despite best efforts to proofread, errors can occur which can change the meaning. Any change was purely unintentional.    Racheal Patches, PA-C 07/09/18 1749    Jeanmarie Plant, MD 07/09/18 2251

## 2018-07-12 ENCOUNTER — Other Ambulatory Visit: Payer: Self-pay

## 2018-07-12 ENCOUNTER — Encounter: Payer: Self-pay | Admitting: Endocrinology

## 2018-07-12 ENCOUNTER — Ambulatory Visit (INDEPENDENT_AMBULATORY_CARE_PROVIDER_SITE_OTHER): Payer: Medicare Other | Admitting: Endocrinology

## 2018-07-12 VITALS — BP 110/64 | HR 83 | Ht 60.0 in | Wt 213.6 lb

## 2018-07-12 DIAGNOSIS — E032 Hypothyroidism due to medicaments and other exogenous substances: Secondary | ICD-10-CM | POA: Diagnosis not present

## 2018-07-12 DIAGNOSIS — T462X1A Poisoning by other antidysrhythmic drugs, accidental (unintentional), initial encounter: Secondary | ICD-10-CM

## 2018-07-12 LAB — TSH: TSH: 0.68 u[IU]/mL (ref 0.35–4.50)

## 2018-07-12 NOTE — Patient Instructions (Addendum)
blood tests are requested for you today.  We'll let you know about the results.   Please come back for a follow-up appointment in 3 months.   

## 2018-07-12 NOTE — Progress Notes (Signed)
Subjective:    Patient ID: Erin [name], female    DOB: 05/11/1945, 73 y.o.   MRN: [id number]  HPI Pt returns for f/u of abnormal thyroid function (was dx'ed whlie on amiodarone--she has taken since 2014; in 2015, she had slightly elevated TSH; several measurements more recently were low; she has never had thyroid imaging; tapazole was resumed in 2018, but then stopped in late 2019, due to hypothyroidism; synthroid was started in early 2020).  pt states she feels no different, and well in general.    Past Medical History:  Diagnosis Date  . Anginal pain (HCC)   . Automatic implantable cardioverter-defibrillator in situ    a. s/p MDT ICD 07/2012; b. SN # [id number]   . CAD (coronary artery disease)    a. cath 2003: LM nl, mid LAD 50%, LCx nl, RCA nl b. L&RHC 08/17/2014 100% occluded mid LCx, 70% mid LAD with FFR 0.82. Medical therapy. CI 2.1. CO 4.01c. LHC 8/18: CTO LCx w/ R-L colatts, LAD s/p PCI/DES x 2  . Chronic systolic CHF (congestive heart failure) (HCC)    a. echo 2014: EF 25%, diffuse HK, LA moderately dilated, RA mildly dilated, PASP 38 mm Hg; b. echo 08/2014: EF 25-30%, diffuse HK, RWMA cannot be excluded, mild MR, mild biatrial enlargement, RV mildly dilated, wall thickness nl, pacer wire/catheter noted, mild to mod TR, PASP high nl, c. TTE 8/18: EF 25-30%, diffuse HK, GR1DD, mod MR, mod dilated LA, nl RV sys fxn, PASP 56  . Encounter for long-term (current) use of anticoagulants   . Exertional shortness of breath   . H/O medication noncompliance   . High cholesterol   . Hypertension   . NICM (nonischemic cardiomyopathy) (HCC)    a. s/p AICD 07/22/2012  . Obesity   . OSA on CPAP    "suppose to; not often" (07/22/2012)  . Other "heavy-for-dates" infants   . PAF (paroxysmal atrial fibrillation) (HCC)    a. on warfarin--> Eliquis 08/2014    Past Surgical History:  Procedure Laterality Date  . ABDOMINAL HYSTERECTOMY  ~ 1998   "laparoscopic" (07/22/2012)  . CARDIAC  CATHETERIZATION  ? 1990's  . CARDIAC CATHETERIZATION N/A 08/17/2014   Procedure: Right/Left Heart Cath and Coronary Angiography;  Surgeon: Kathleene Hazel, MD;  Location: [facility] INVASIVE CV LAB;  Service: Cardiovascular;  Laterality: N/A;  . CARDIAC DEFIBRILLATOR PLACEMENT  07/22/2012   dual-chamber ICD.  Marland Kitchen CORONARY STENT INTERVENTION N/A 08/24/2016   Procedure: CORONARY STENT INTERVENTION;  Surgeon: Iran Ouch, MD;  Location: [facility] INVASIVE CV LAB;  Service: Cardiovascular;  Laterality: N/A;  . IMPLANTABLE CARDIOVERTER DEFIBRILLATOR IMPLANT N/A 07/22/2012   Procedure: IMPLANTABLE CARDIOVERTER DEFIBRILLATOR IMPLANT;  Surgeon: Marinus Maw, MD;  [facility] CATH LAB;  Service: Cardiovascular;  Laterality: N/A;  . LEFT HEART CATH AND CORONARY ANGIOGRAPHY N/A 08/24/2016   Procedure: LEFT HEART CATH AND CORONARY ANGIOGRAPHY;  Surgeon: Iran Ouch, MD;  Location: [facility] INVASIVE CV LAB;  Service: Cardiovascular;  Laterality: N/A;  . TEE WITHOUT CARDIOVERSION N/A 08/21/2014   Procedure: TRANSESOPHAGEAL ECHOCARDIOGRAM (TEE);  Surgeon: Chilton Si, MD;  Location: [facility] ENDOSCOPY;  Service: Cardiovascular;  Laterality: N/A;  . TUBAL LIGATION  ~ 1978    Social History   Socioeconomic History  . Marital status: Single    Spouse name: Not on file  . Number of children: 3  . Years of education: Not on file  . Highest education level: Not on file  Occupational History  . Occupation:  Retired    Fish farm manager: [employer]  . Financial resource strain: Not on file  . Food insecurity    Worry: Not on file    Inability: Not on file  . Transportation needs    Medical: Not on file    Non-medical: Not on file  Tobacco Use  . Smoking status: Never Smoker  . Smokeless tobacco: Never Used  Substance and Sexual Activity  . Alcohol use: No  . Drug use: No  . Sexual activity: Never  Lifestyle  . Physical activity    Days per week: Not on file    Minutes per session: Not on  file  . Stress: Not on file  Relationships  . Social Herbalist on phone: Not on file    Gets together: Not on file    Attends religious service: Not on file    Active member of club or organization: Not on file    Attends meetings of clubs or organizations: Not on file    Relationship status: Not on file  . Intimate partner violence    Fear of current or ex partner: Not on file    Emotionally abused: Not on file    Physically abused: Not on file    Forced sexual activity: Not on file  Other Topics Concern  . Not on file  Social History Narrative  . Not on file    Current Outpatient Medications on File Prior to Visit  Medication Sig Dispense Refill  . apixaban (ELIQUIS) 5 MG TABS tablet Take 1 tablet (5 mg total) by mouth 2 (two) times daily. 60 tablet 11  . atorvastatin (LIPITOR) 40 MG tablet TAKE 1 TABLET BY MOUTH ONCE DAILY AT  6  PM 90 tablet 2  . carvedilol (COREG) 3.125 MG tablet TAKE 1 TABLET BY MOUTH TWICE DAILY WITH MEALS 180 tablet 0  . clopidogrel (PLAVIX) 75 MG tablet Take 1 tablet by mouth once daily with breakfast 90 tablet 1  . levothyroxine (SYNTHROID, LEVOTHROID) 88 MCG tablet Take 1 tablet (88 mcg total) by mouth daily before breakfast. 90 tablet 1  . losartan (COZAAR) 100 MG tablet Take 1 tablet (100 mg total) by mouth daily. 90 tablet 3  . spironolactone (ALDACTONE) 25 MG tablet Take 1/2 (one-half) tablet by mouth once daily 45 tablet 0  . torsemide (DEMADEX) 20 MG tablet Take 2 tablets (40 mg total) by mouth daily.     No current facility-administered medications on file prior to visit.     No Known Allergies  Family History  Problem Relation Age of Onset  . Heart disease Mother   . Heart disease Father   . Lung disease Sister   . Diabetes Sister   . Heart failure Brother   . Thyroid disease Neg Hx     BP 110/64 (BP Location: Left Arm, Patient Position: Sitting, Cuff Size: Large)   Pulse 83   Ht 5' (1.524 m)   Wt 213 lb 9.6 oz (96.9 kg)    SpO2 93%   BMI 41.72 kg/m    Review of Systems Denies neck swelling    Objective:   Physical Exam VITAL SIGNS:  See vs page GENERAL: no distress NECK: There is no palpable thyroid enlargement.  No thyroid nodule is palpable.  No palpable lymphadenopathy at the anterior neck.     Lab Results  Component Value Date   TSH 0.68 07/12/2018      Assessment & Plan:  Hypothyroidism: well-replaced  AF: in this context, we should correct even a slightly abnormal TSH  Patient Instructions  blood tests are requested for you today.  We'll let you know about the results.   Please come back for a follow-up appointment in 3 months.

## 2018-07-13 ENCOUNTER — Telehealth: Payer: Self-pay

## 2018-07-13 NOTE — Telephone Encounter (Signed)
Spoke with the pt and provided the telephone number for [facility] to help her get established with a pcp. Pt voiced appreciation for the call.

## 2018-07-13 NOTE — Telephone Encounter (Signed)
-----   Message from Ryan M Dunn, PA-C sent at 07/07/2018  7:52 AM EDT ----- White blood cell count normal.   Hemoglobin slightly low. Renal function slightly worse than her baseline.  Recommend she reduce torsemide back to 40 mg daily and follow-up with PCP regarding underlying anemia. 

## 2018-07-19 ENCOUNTER — Ambulatory Visit: Payer: Medicare Other | Admitting: Cardiovascular Disease

## 2018-07-22 ENCOUNTER — Other Ambulatory Visit: Payer: Self-pay

## 2018-07-22 ENCOUNTER — Encounter: Payer: Medicare Other | Attending: Physician Assistant | Admitting: Physician Assistant

## 2018-07-22 DIAGNOSIS — I890 Lymphedema, not elsewhere classified: Secondary | ICD-10-CM | POA: Insufficient documentation

## 2018-07-22 DIAGNOSIS — I872 Venous insufficiency (chronic) (peripheral): Secondary | ICD-10-CM | POA: Insufficient documentation

## 2018-07-22 DIAGNOSIS — I2510 Atherosclerotic heart disease of native coronary artery without angina pectoris: Secondary | ICD-10-CM | POA: Diagnosis not present

## 2018-07-22 DIAGNOSIS — I480 Paroxysmal atrial fibrillation: Secondary | ICD-10-CM | POA: Diagnosis not present

## 2018-07-22 DIAGNOSIS — N183 Chronic kidney disease, stage 3 (moderate): Secondary | ICD-10-CM | POA: Insufficient documentation

## 2018-07-22 DIAGNOSIS — I130 Hypertensive heart and chronic kidney disease with heart failure and stage 1 through stage 4 chronic kidney disease, or unspecified chronic kidney disease: Secondary | ICD-10-CM | POA: Diagnosis not present

## 2018-07-22 DIAGNOSIS — I5042 Chronic combined systolic (congestive) and diastolic (congestive) heart failure: Secondary | ICD-10-CM | POA: Insufficient documentation

## 2018-07-22 DIAGNOSIS — L97812 Non-pressure chronic ulcer of other part of right lower leg with fat layer exposed: Secondary | ICD-10-CM | POA: Insufficient documentation

## 2018-07-22 NOTE — Progress Notes (Signed)
Erin [name], Erin [name] ([id number]) Visit Report for 07/22/2018 Abuse/Suicide Risk Screen Details Patient Name: Erin [name], Erin [name]. Date of Service: 07/22/2018 9:45 AM Medical Record Number: [id number] Patient Account Number: [id number] Date of Birth/Sex: 03/08/1945 (73 y.o. F) Treating RN: Army Melia Primary Care Jackqulyn Mendel: PATIENT, NO Other Clinician: Referring Tillman Kazmierski: Referral, Self Treating Ransome Helwig/Extender: STONE III, HOYT Weeks in Treatment: 0 Abuse/Suicide Risk Screen Items Answer ABUSE RISK SCREEN: Has anyone close to you tried to hurt or harm you recentlyo No Do you feel uncomfortable with anyone in your familyo No Has anyone forced you do things that you didnot want to doo No Electronic Signature(s) Signed: 07/22/2018 11:37:09 AM By: Army Melia Entered By: Army Melia on 07/22/2018 09:57:21 Tailor, Placido Sou ([id number]) -------------------------------------------------------------------------------- Activities of Daily Living Details Patient Name: Erin [name], Erin [name]. Date of Service: 07/22/2018 9:45 AM Medical Record Number: [id number] Patient Account Number: [id number] Date of Birth/Sex: 01-26-1945 (73 y.o. F) Treating RN: Army Melia Primary Care Sion Thane: PATIENT, NO Other Clinician: Referring Chani Ghanem: Referral, Self Treating Reizy Dunlow/Extender: STONE III, HOYT Weeks in Treatment: 0 Activities of Daily Living Items Answer Activities of Daily Living (Please select one for each item) Drive Automobile Completely Able Take Medications Completely Able Use Telephone Completely Able Care for Appearance Completely Able Use Toilet Completely Able Bath / Shower Completely Able Dress Self Completely Able Feed Self Completely Able Walk Completely Able Get In / Out Bed Completely Able Housework Completely Able Prepare Meals Completely Able Handle Money Completely Able Shop for Self Completely Able Electronic Signature(s) Signed: 07/22/2018 11:37:09 AM By: Army Melia Entered By: Army Melia on 07/22/2018 09:57:40 Erin [name] ([id number]) -------------------------------------------------------------------------------- Education Screening Details Patient Name: Erin [name]. Date of Service: 07/22/2018 9:45 AM Medical Record Number: [id number] Patient Account Number: [id number] Date of Birth/Sex: Dec 04, 1945 (73 y.o. F) Treating RN: Army Melia Primary Care Tanmay Halteman: PATIENT, NO Other Clinician: Referring Severin Bou: Referral, Self Treating Kaliopi Blyden/Extender: Melburn Hake, HOYT Weeks in Treatment: 0 Primary Learner Assessed: Patient Learning Preferences/Education Level/Primary Language Learning Preference: Explanation, Demonstration, Video Highest Education Level: High School Preferred Language: English Cognitive Barrier Language Barrier: No Translator Needed: No Memory Deficit: No Emotional Barrier: No Cultural/Religious Beliefs Affecting Medical Care: No Physical Barrier Impaired Vision: No Impaired Hearing: No Decreased Hand dexterity: No Knowledge/Comprehension Knowledge Level: High Comprehension Level: High Ability to understand written High instructions: Ability to understand verbal High instructions: Motivation Anxiety Level: Calm Cooperation: Cooperative Education Importance: Acknowledges Need Interest in Health Problems: Asks Questions Perception: Coherent Willingness to Engage in Self- High Management Activities: Readiness to Engage in Self- High Management Activities: Electronic Signature(s) Signed: 07/22/2018 11:37:09 AM By: Army Melia Entered By: Army Melia on 07/22/2018 09:58:01 Erin [name], Erin [name] ([id number]) -------------------------------------------------------------------------------- Fall Risk Assessment Details Patient Name: Erin [name]. Date of Service: 07/22/2018 9:45 AM Medical Record Number: [id number] Patient Account Number: [id number] Date of Birth/Sex: 1945/07/28 (73 y.o.  F) Treating RN: Army Melia Primary Care Michaella Imai: PATIENT, NO Other Clinician: Referring Khari Lett: Referral, Self Treating Charna Neeb/Extender: STONE III, HOYT Weeks in Treatment: 0 Fall Risk Assessment Items Have you had 2 or more falls in the last 12 monthso 0 No Have you had any fall that resulted in injury in the last 12 monthso 0 No FALLS RISK SCREEN History of falling - immediate or within 3 months 0 No Secondary diagnosis (Do you have 2 or more medical diagnoseso) 0 No Ambulatory aid None/bed rest/wheelchair/nurse 0 No Crutches/cane/walker 15 Yes Furniture 0 No Intravenous therapy Access/Saline/Heparin Lock 0 No Gait/Transferring Normal/ bed rest/ wheelchair 0 No  Weak (short steps with or without shuffle, stooped but able to lift head while 0 No walking, may seek support from furniture) Impaired (short steps with shuffle, may have difficulty arising from chair, head 0 No down, impaired balance) Mental Status Oriented to own ability 0 No Electronic Signature(s) Signed: 07/22/2018 11:37:09 AM By: Rodell Perna Entered By: Rodell Perna on 07/22/2018 09:58:12 Loja, Arlana Pouch ([id number]) -------------------------------------------------------------------------------- Foot Assessment Details Patient Name: Erin [name]. Date of Service: 07/22/2018 9:45 AM Medical Record Number: [id number] Patient Account Number: [id number] Date of Birth/Sex: Dec 28, 1945 (73 y.o. F) Treating RN: Rodell Perna Primary Care Ishaaq Penna: PATIENT, NO Other Clinician: Referring Perl Folmar: Referral, Self Treating Gissele Narducci/Extender: STONE III, HOYT Weeks in Treatment: 0 Foot Assessment Items Site Locations + = Sensation present, - = Sensation absent, C = Callus, U = Ulcer R = Redness, W = Warmth, M = Maceration, PU = Pre-ulcerative lesion F = Fissure, S = Swelling, D = Dryness Assessment Right: Left: Other Deformity: No No Prior Foot Ulcer: No No Prior Amputation: No No Charcot Joint: No  No Ambulatory Status: Ambulatory Without Help Gait: Steady Electronic Signature(s) Signed: 07/22/2018 11:37:09 AM By: Rodell Perna Entered By: Rodell Perna on 07/22/2018 09:58:59 Rammel, Arlana Pouch ([id number]) -------------------------------------------------------------------------------- Nutrition Risk Screening Details Patient Name: Erin [name]. Date of Service: 07/22/2018 9:45 AM Medical Record Number: [id number] Patient Account Number: [id number] Date of Birth/Sex: 1945/02/17 (73 y.o. F) Treating RN: Rodell Perna Primary Care Massiel Stipp: PATIENT, NO Other Clinician: Referring Leili Eskenazi: Referral, Self Treating Elif Yonts/Extender: STONE III, HOYT Weeks in Treatment: 0 Height (in): 60 Weight (lbs): 213 Body Mass Index (BMI): 41.6 Nutrition Risk Screening Items Score Screening NUTRITION RISK SCREEN: I have an illness or condition that made me change the kind and/or amount of 0 No food I eat I eat fewer than two meals per day 0 No I eat few fruits and vegetables, or milk products 0 No I have three or more drinks of beer, liquor or wine almost every day 0 No I have tooth or mouth problems that make it hard for me to eat 0 No I don't always have enough money to buy the food I need 0 No I eat alone most of the time 0 No I take three or more different prescribed or over-the-counter drugs a day 0 No Without wanting to, I have lost or gained 10 pounds in the last six months 0 No I am not always physically able to shop, cook and/or feed myself 0 No Nutrition Protocols Good Risk Protocol 0 No interventions needed Moderate Risk Protocol High Risk Proctocol Risk Level: Good Risk Score: 0 Electronic Signature(s) Signed: 07/22/2018 11:37:09 AM By: Rodell Perna Entered By: Rodell Perna on 07/22/2018 09:58:19

## 2018-07-24 NOTE — Progress Notes (Signed)
AVENLY, ROBERGE ([id number]) Visit Report for 07/22/2018 Chief Complaint Document Details Patient Name: Erin [name], Erin [name]. Date of Service: 07/22/2018 9:45 AM Medical Record Number: [id number] Patient Account Number: [id number] Date of Birth/Sex: 03-21-1945 (73 y.o. F) Treating RN: Montey Hora Primary Care Provider: PATIENT, NO Other Clinician: Referring Provider: Referral, Self Treating Provider/Extender: Melburn Hake, HOYT Weeks in Treatment: 0 Information Obtained from: Patient Chief Complaint Right LE Ulcer Electronic Signature(s) Signed: 07/24/2018 6:25:26 PM By: Worthy Keeler PA-C Entered By: Worthy Keeler on 07/22/2018 10:02:19 Erin [name] ([id number]) -------------------------------------------------------------------------------- HPI Details Patient Name: Erin [name]. Date of Service: 07/22/2018 9:45 AM Medical Record Number: [id number] Patient Account Number: [id number] Date of Birth/Sex: 25-Sep-1945 (73 y.o. F) Treating RN: Montey Hora Primary Care Provider: PATIENT, NO Other Clinician: Referring Provider: Referral, Self Treating Provider/Extender: STONE III, HOYT Weeks in Treatment: 0 History of Present Illness HPI Description: 07/22/18 patient presents today for initial evaluation regarding issues that she is having with wounds on the right lateral lower leg. The good news is that these actually appear to be showing signs of already feeling which is good news. There still a lot of maceration and drainage as well as edema noted at this point. This is something that from the edema standpoint has been going on for some time the patient obviously as lymphedema among issues with venous stasis as well. With that being said I do believe at this point that she's having some discomfort at the site fortunately there's no evidence of active infection at this time which is good news. The patient has been having issues as well it sounds like with something completely  separate in regard to pain she's unable to raise her legs and elevate them due to the fact that whenever she does she has a burning pain that goes into her right hip laterally and then runs down into her foot in times as well. This actually appears to be something emanating from her back which I discussed with her today as well I think that's completely separate from the chronic venous stasis and lymphedema which we are going to be managing at this point. No fevers, chills, nausea, or vomiting noted at this time. Electronic Signature(s) Signed: 07/24/2018 6:25:26 PM By: Worthy Keeler PA-C Entered By: Worthy Keeler on 07/24/2018 18:19:23 DONNE, ROBILLARD ([id number]) -------------------------------------------------------------------------------- Physical Exam Details Patient Name: Erin [name], Erin [name]. Date of Service: 07/22/2018 9:45 AM Medical Record Number: [id number] Patient Account Number: [id number] Date of Birth/Sex: 02/13/1945 (73 y.o. F) Treating RN: Montey Hora Primary Care Provider: PATIENT, NO Other Clinician: Referring Provider: Referral, Self Treating Provider/Extender: STONE III, HOYT Weeks in Treatment: 0 Constitutional sitting or standing blood pressure is within target range for patient.. pulse regular and within target range for patient.Marland Kitchen respirations regular, non-labored and within target range for patient.Marland Kitchen temperature within target range for patient.. Well- nourished and well-hydrated in no acute distress. Eyes conjunctiva clear no eyelid edema noted. pupils equal round and reactive to light and accommodation. Ears, Nose, Mouth, and Throat no gross abnormality of ear auricles or external auditory canals. normal hearing noted during conversation. mucus membranes moist. Respiratory normal breathing without difficulty. clear to auscultation bilaterally. Cardiovascular regular rate and rhythm with normal S1, S2. 2+ dorsalis pedis/posterior tibialis pulses. 1+ pitting  edema of the bilateral lower extremities. Gastrointestinal (GI) soft, non-tender, non-distended, +BS. no ventral hernia noted. Musculoskeletal Patient unable to walk without assistance. no significant deformity or arthritic changes, no loss or range of motion, no clubbing.  Psychiatric this patient is able to make decisions and demonstrates good insight into disease process. Alert and Oriented x 3. pleasant and cooperative. Notes Upon evaluation patient's wound bed actually showed signs of excellent improvement as far as already she seems to be showing epithelialization and healing. This is good news. With that being said I am concerned about the fact that she does on evaluation as well have a positive straight leg raise and again symptoms which are consistent with radicular lower back pain. This is into the right lower extremity and what seems to be the L5-S1 distribution. She seems to have excellent blood flow and in fact her ABI was 0.95 indicating no evidence of vascular claudication that would account for the pain. In fact even sitting in the chair that we had her in where her body was more than 90o angle she was unable to rest comfortably and kept slipping down trying to flatten out more due to the pain that was building up and radiating down her right lateral lower extremity. Electronic Signature(s) Signed: 07/24/2018 6:25:26 PM By: Lenda Kelp PA-C Entered By: Lenda Kelp on 07/24/2018 18:20:54 WALDA, HERTZOG ([id number]) -------------------------------------------------------------------------------- Physician Orders Details Patient Name: Erin [name], Erin [name]. Date of Service: 07/22/2018 9:45 AM Medical Record Number: [id number] Patient Account Number: [id number] Date of Birth/Sex: 02/21/1945 (73 y.o. F) Treating RN: Curtis Sites Primary Care Provider: PATIENT, NO Other Clinician: Referring Provider: Referral, Self Treating Provider/Extender: STONE III, HOYT Weeks in  Treatment: 0 Verbal / Phone Orders: No Diagnosis Coding ICD-10 Coding Code Description I87.2 Venous insufficiency (chronic) (peripheral) L97.812 Non-pressure chronic ulcer of other part of right lower leg with fat layer exposed N18.3 Chronic kidney disease, stage 3 (moderate) I50.42 Chronic combined systolic (congestive) and diastolic (congestive) heart failure I10 Essential (primary) hypertension I48.0 Paroxysmal atrial fibrillation Wound Cleansing Wound #1 Right,Lateral Lower Leg o Clean wound with Normal Saline. o May shower with protection. - Please do not get your wrap wet Primary Wound Dressing Wound #1 Right,Lateral Lower Leg o Silver Alginate Secondary Dressing Wound #1 Right,Lateral Lower Leg o XtraSorb Dressing Change Frequency Wound #1 Right,Lateral Lower Leg o Change dressing every week Follow-up Appointments o Return Appointment in 1 week. Edema Control Wound #1 Right,Lateral Lower Leg o 3 Layer Compression System - Right Lower Extremity Electronic Signature(s) Signed: 07/22/2018 5:37:40 PM By: Curtis Sites Signed: 07/24/2018 6:25:26 PM By: Lenda Kelp PA-C Entered By: Curtis Sites on 07/22/2018 10:19:14 Erin [name] ([id number]) -------------------------------------------------------------------------------- Problem List Details Patient Name: JAYMES, REVELS. Date of Service: 07/22/2018 9:45 AM Medical Record Number: [id number] Patient Account Number: [id number] Date of Birth/Sex: [birth year] (73 y.o. F) Treating RN: Curtis Sites Primary Care Provider: PATIENT, NO Other Clinician: Referring Provider: Referral, Self Treating Provider/Extender: STONE III, HOYT Weeks in Treatment: 0 Active Problems ICD-10 Evaluated Encounter Code Description Active Date Today Diagnosis I87.2 Venous insufficiency (chronic) (peripheral) 07/22/2018 No Yes I89.0 Lymphedema, not elsewhere classified 07/24/2018 No Yes L97.812 Non-pressure chronic ulcer of  other part of right lower leg 07/22/2018 No Yes with fat layer exposed N18.3 Chronic kidney disease, stage 3 (moderate) 07/22/2018 No Yes I50.42 Chronic combined systolic (congestive) and diastolic 07/22/2018 No Yes (congestive) heart failure I10 Essential (primary) hypertension 07/22/2018 No Yes I48.0 Paroxysmal atrial fibrillation 07/22/2018 No Yes Inactive Problems Resolved Problems Electronic Signature(s) Signed: 07/24/2018 6:25:26 PM By: Lenda Kelp PA-C Entered By: Lenda Kelp on 07/24/2018 18:16:35 Mckeag, Arlana Pouch ([id number]) -------------------------------------------------------------------------------- Progress Note Details Patient Name: Erin [name]. Date of Service: 07/22/2018  9:45 AM Medical Record Number: [id number] Patient Account Number: [id number] Date of Birth/Sex: 12/04/1945 [age] y.o. F) Treating RN: Curtis Sitesorthy, Joanna Primary Care Provider: PATIENT, NO Other Clinician: Referring Provider: Referral, Self Treating Provider/Extender: STONE III, HOYT Weeks in Treatment: 0 Subjective Chief Complaint Information obtained from Patient Right LE Ulcer History of Present Illness (HPI) 07/22/18 patient presents today for initial evaluation regarding issues that she is having with wounds on the right lateral lower leg. The good news is that these actually appear to be showing signs of already feeling which is good news. There still a lot of maceration and drainage as well as edema noted at this point. This is something that from the edema standpoint has been going on for some time the patient obviously as lymphedema among issues with venous stasis as well. With that being said I do believe at this point that she's having some discomfort at the site fortunately there's no evidence of active infection at this time which is good news. The patient has been having issues as well it sounds like with something completely separate in regard to pain she's unable to raise her legs  and elevate them due to the fact that whenever she does she has a burning pain that goes into her right hip laterally and then runs down into her foot in times as well. This actually appears to be something emanating from her back which I discussed with her today as well I think that's completely separate from the chronic venous stasis and lymphedema which we are going to be managing at this point. No fevers, chills, nausea, or vomiting noted at this time. Patient History Allergies No known Allergies Family History Cancer - Siblings, Diabetes - Father,Siblings, Heart Disease - Mother,Siblings, Hypertension - Mother, Lung Disease - Siblings, No family history of Hereditary Spherocytosis, Kidney Disease, Seizures, Stroke, Thyroid Problems, Tuberculosis. Social History Never smoker, Marital Status - Single, Alcohol Use - Never, Drug Use - No History, Caffeine Use - Never. Medical History Eyes Denies history of Cataracts, Glaucoma, Optic Neuritis Ear/Nose/Mouth/Throat Denies history of Chronic sinus problems/congestion, Middle ear problems Hematologic/Lymphatic Denies history of Anemia, Hemophilia, Human Immunodeficiency Virus, Lymphedema, Sickle Cell Disease Respiratory Denies history of Aspiration, Asthma, Chronic Obstructive Pulmonary Disease (COPD), Pneumothorax, Sleep Apnea, Tuberculosis Cardiovascular Patient has history of Congestive Heart Failure, Coronary Artery Disease, Hypertension Denies history of Angina, Arrhythmia, Deep Vein Thrombosis, Hypotension, Myocardial Infarction, Peripheral Arterial Disease, Peripheral Venous Disease, Phlebitis, Vasculitis Gastrointestinal Erin [name], [name] ([id number]) Denies history of Cirrhosis , Colitis, Crohn s, Hepatitis A, Hepatitis B, Hepatitis C Endocrine Denies history of Type I Diabetes, Type II Diabetes Genitourinary Denies history of End Stage Renal Disease Immunological Denies history of Lupus Erythematosus, Raynaud s,  Scleroderma Integumentary (Skin) Denies history of History of Burn, History of pressure wounds Musculoskeletal Denies history of Gout, Rheumatoid Arthritis, Osteoarthritis, Osteomyelitis Neurologic Denies history of Dementia, Neuropathy, Quadriplegia, Paraplegia, Seizure Disorder Oncologic Denies history of Received Chemotherapy, Received Radiation Psychiatric Denies history of Anorexia/bulimia, Confinement Anxiety Review of Systems (ROS) Constitutional Symptoms (General Health) Denies complaints or symptoms of Fatigue, Fever, Chills, Marked Weight Change. Eyes Complains or has symptoms of Glasses / Contacts - glasses. Denies complaints or symptoms of Dry Eyes, Vision Changes. Ear/Nose/Mouth/Throat Denies complaints or symptoms of Difficult clearing ears, Sinusitis. Hematologic/Lymphatic Denies complaints or symptoms of Bleeding / Clotting Disorders, Human Immunodeficiency Virus. Respiratory Denies complaints or symptoms of Chronic or frequent coughs, Shortness of Breath. Cardiovascular Denies complaints or symptoms of Chest pain, LE edema. Gastrointestinal Denies complaints  or symptoms of Frequent diarrhea, Nausea, Vomiting. Endocrine Complains or has symptoms of Thyroid disease. Denies complaints or symptoms of Hepatitis, Polydypsia (Excessive Thirst). Genitourinary Denies complaints or symptoms of Kidney failure/ Dialysis, Incontinence/dribbling. Immunological Denies complaints or symptoms of Hives, Itching. Integumentary (Skin) Complains or has symptoms of Wounds. Denies complaints or symptoms of Bleeding or bruising tendency, Breakdown, Swelling. Musculoskeletal Denies complaints or symptoms of Muscle Pain, Muscle Weakness. Neurologic Denies complaints or symptoms of Numbness/parasthesias, Focal/Weakness. Psychiatric Denies complaints or symptoms of Anxiety, Claustrophobia. Erin BowensHAMILTON, Michaila .. ([id number]) Objective Constitutional sitting or standing blood pressure is  within target range for patient.. pulse regular and within target range for patient.Marland Kitchen. respirations regular, non-labored and within target range for patient.Marland Kitchen. temperature within target range for patient.. Well- nourished and well-hydrated in no acute distress. Vitals Time Taken: 9:42 AM, Height: 60 in, Source: Stated, Weight: 213 lbs, Source: Stated, BMI: 41.6, Temperature: 97.9 F, Pulse: 77 bpm, Respiratory Rate: 16 breaths/min, Blood Pressure: 126/47 mmHg. Eyes conjunctiva clear no eyelid edema noted. pupils equal round and reactive to light and accommodation. Ears, Nose, Mouth, and Throat no gross abnormality of ear auricles or external auditory canals. normal hearing noted during conversation. mucus membranes moist. Respiratory normal breathing without difficulty. clear to auscultation bilaterally. Cardiovascular regular rate and rhythm with normal S1, S2. 2+ dorsalis pedis/posterior tibialis pulses. 1+ pitting edema of the bilateral lower extremities. Gastrointestinal (GI) soft, non-tender, non-distended, +BS. no ventral hernia noted. Musculoskeletal Patient unable to walk without assistance. no significant deformity or arthritic changes, no loss or range of motion, no clubbing. Psychiatric this patient is able to make decisions and demonstrates good insight into disease process. Alert and Oriented x 3. pleasant and cooperative. General Notes: Upon evaluation patient's wound bed actually showed signs of excellent improvement as far as already she seems to be showing epithelialization and healing. This is good news. With that being said I am concerned about the fact that she does on evaluation as well have a positive straight leg raise and again symptoms which are consistent with radicular lower back pain. This is into the right lower extremity and what seems to be the L5-S1 distribution. She seems to have excellent blood flow and in fact her ABI was 0.95 indicating no evidence of  vascular claudication that would account for the pain. In fact even sitting in the chair that we had her in where her body was more than 90 angle she was unable to rest comfortably and kept slipping down trying to flatten out more due to the pain that was building up and radiating down her right lateral lower extremity. Integumentary (Hair, Skin) Wound #1 status is Open. Original cause of wound was Blister. The wound is located on the Right,Lateral Lower Leg. The wound measures 11.8cm length x 10cm width x 0.1cm depth; 92.677cm^2 area and 9.268cm^3 volume. There is Fat Layer (Subcutaneous Tissue) Exposed exposed. There is no tunneling or undermining noted. There is a large amount of serous drainage noted. There is medium (34-66%) pink granulation within the wound bed. There is a medium (34-66%) amount of necrotic tissue within the wound bed including Eschar and Adherent Slough. Erin BowensHAMILTON, Gaia M. ([id number]) Assessment Active Problems ICD-10 Venous insufficiency (chronic) (peripheral) Lymphedema, not elsewhere classified Non-pressure chronic ulcer of other part of right lower leg with fat layer exposed Chronic kidney disease, stage 3 (moderate) Chronic combined systolic (congestive) and diastolic (congestive) heart failure Essential (primary) hypertension Paroxysmal atrial fibrillation Procedures Wound #1 Pre-procedure diagnosis of Wound #1 is a Venous  Leg Ulcer located on the Right,Lateral Lower Leg . There was a Three Layer Compression Therapy Procedure with a pre-treatment ABI of 1 by Curtis Sitesorthy, Joanna, RN. Post procedure Diagnosis Wound #1: Same as Pre-Procedure Plan Wound Cleansing: Wound #1 Right,Lateral Lower Leg: Clean wound with Normal Saline. May shower with protection. - Please do not get your wrap wet Primary Wound Dressing: Wound #1 Right,Lateral Lower Leg: Silver Alginate Secondary Dressing: Wound #1 Right,Lateral Lower Leg: XtraSorb Dressing Change Frequency: Wound  #1 Right,Lateral Lower Leg: Change dressing every week Follow-up Appointments: Return Appointment in 1 week. Edema Control: Wound #1 Right,Lateral Lower Leg: 3 Layer Compression System - Right Lower Extremity At this point with regard to her wound I do feel like that a compression wrap would be appropriate for her at this time and we are gonna initiate that currently. Subsequently I think that if she can elevate that would be great although again the biggest issue is she's unable to sit and elevate her legs as this mimics straight leg raise testing which again is positive in this patient Erin BowensHAMILTON, Donika M. ([id number]) today. Subsequently I think that's we will see her back for reevaluation likely in roughly a week to see were things stand at that point with regard to the wound. She's in agreement this plan. Please see above for specific wound care orders. We will see patient for re-evaluation in 1 week(s) here in the clinic. If anything worsens or changes patient will contact our office for additional recommendations. with regard to her back pain with radiating right lower extremity pain in the L5-S1 distribution I would like to see about referring her to [facility] for further evaluation in this regard. I think that she would benefit from being able to elevate her legs but again right now she's not able do to do this secondary to the pain that she experiences and I believe this is all emanating from her back. We'll see what he has to say upon evaluation of what treatment options they recommend for her at that point. I appreciate the evaluation by Dr. Ethelene Halamos in this regard. Electronic Signature(s) Signed: 07/24/2018 6:25:26 PM By: Lenda KelpStone III, Hoyt PA-C Entered By: Lenda KelpStone III, Hoyt on 07/24/2018 18:23:46 Erin BowensHAMILTON, Yasmeen M. ([id number]) -------------------------------------------------------------------------------- ROS/PFSH Details Patient Name: Erin BowensHAMILTON, [name] Date  of Service: 07/22/2018 9:45 AM Medical Record Number: [id number] Patient Account Number: [id number] Date of Birth/Sex: 08/15/1945 (73 y.o. F) Treating RN: Rodell PernaScott, Dajea Primary Care Provider: PATIENT, NO Other Clinician: Referring Provider: Referral, Self Treating Provider/Extender: STONE III, HOYT Weeks in Treatment: 0 Constitutional Symptoms (General Health) Complaints and Symptoms: Negative for: Fatigue; Fever; Chills; Marked Weight Change Eyes Complaints and Symptoms: Positive for: Glasses / Contacts - glasses Negative for: Dry Eyes; Vision Changes Medical History: Negative for: Cataracts; Glaucoma; Optic Neuritis Ear/Nose/Mouth/Throat Complaints and Symptoms: Negative for: Difficult clearing ears; Sinusitis Medical History: Negative for: Chronic sinus problems/congestion; Middle ear problems Hematologic/Lymphatic Complaints and Symptoms: Negative for: Bleeding / Clotting Disorders; Human Immunodeficiency Virus Medical History: Negative for: Anemia; Hemophilia; Human Immunodeficiency Virus; Lymphedema; Sickle Cell Disease Respiratory Complaints and Symptoms: Negative for: Chronic or frequent coughs; Shortness of Breath Medical History: Negative for: Aspiration; Asthma; Chronic Obstructive Pulmonary Disease (COPD); Pneumothorax; Sleep Apnea; Tuberculosis Cardiovascular Complaints and Symptoms: Negative for: Chest pain; LE edema Medical History: Positive for: Congestive Heart Failure; Coronary Artery Disease; Hypertension Negative for: Angina; Arrhythmia; Deep Vein Thrombosis; Hypotension; Myocardial Infarction; Peripheral Arterial Disease; Peripheral Venous Disease; Phlebitis; Vasculitis Gastrointestinal Erin BowensHAMILTON, [name] ([id number]) Complaints  and Symptoms: Negative for: Frequent diarrhea; Nausea; Vomiting Medical History: Negative for: Cirrhosis ; Colitis; Crohnos; Hepatitis A; Hepatitis B; Hepatitis C Endocrine Complaints and Symptoms: Positive for: Thyroid  disease Negative for: Hepatitis; Polydypsia (Excessive Thirst) Medical History: Negative for: Type I Diabetes; Type II Diabetes Genitourinary Complaints and Symptoms: Negative for: Kidney failure/ Dialysis; Incontinence/dribbling Medical History: Negative for: End Stage Renal Disease Immunological Complaints and Symptoms: Negative for: Hives; Itching Medical History: Negative for: Lupus Erythematosus; Raynaudos; Scleroderma Integumentary (Skin) Complaints and Symptoms: Positive for: Wounds Negative for: Bleeding or bruising tendency; Breakdown; Swelling Medical History: Negative for: History of Burn; History of pressure wounds Musculoskeletal Complaints and Symptoms: Negative for: Muscle Pain; Muscle Weakness Medical History: Negative for: Gout; Rheumatoid Arthritis; Osteoarthritis; Osteomyelitis Neurologic Complaints and Symptoms: Negative for: Numbness/parasthesias; Focal/Weakness Medical History: Negative for: Dementia; Neuropathy; Quadriplegia; Paraplegia; Seizure Disorder Psychiatric Complaints and Symptoms: Negative for: Anxiety; Erin [name], Erin [name] ([id number]) Medical History: Negative for: Anorexia/bulimia; Confinement Anxiety Oncologic Medical History: Negative for: Received Chemotherapy; Received Radiation Immunizations Pneumococcal Vaccine: Received Pneumococcal Vaccination: No Implantable Devices Yes Family and Social History Cancer: Yes - Siblings; Diabetes: Yes - Father,Siblings; Heart Disease: Yes - Mother,Siblings; Hereditary Spherocytosis: No; Hypertension: Yes - Mother; Kidney Disease: No; Lung Disease: Yes - Siblings; Seizures: No; Stroke: No; Thyroid Problems: No; Tuberculosis: No; Never smoker; Marital Status - Single; Alcohol Use: Never; Drug Use: No History; Caffeine Use: Never; Financial Concerns: No; Food, Clothing or Shelter Needs: No; Support System Lacking: No; Transportation Concerns: No Electronic Signature(s) Signed:  07/22/2018 11:37:09 AM By: Rodell Perna Signed: 07/24/2018 6:25:26 PM By: Lenda Kelp PA-C Entered By: Rodell Perna on 07/22/2018 09:56:37 Ybarbo, Arlana Pouch ([id number]) -------------------------------------------------------------------------------- SuperBill Details Patient Name: Erin [name]. Date of Service: 07/22/2018 Medical Record Number: [id number] Patient Account Number: [id number] Date of Birth/Sex: 1945-09-29 (73 y.o. F) Treating RN: Curtis Sites Primary Care Provider: PATIENT, NO Other Clinician: Referring Provider: Referral, Self Treating Provider/Extender: STONE III, HOYT Weeks in Treatment: 0 Diagnosis Coding ICD-10 Codes Code Description I87.2 Venous insufficiency (chronic) (peripheral) L97.812 Non-pressure chronic ulcer of other part of right lower leg with fat layer exposed N18.3 Chronic kidney disease, stage 3 (moderate) I50.42 Chronic combined systolic (congestive) and diastolic (congestive) heart failure I10 Essential (primary) hypertension I48.0 Paroxysmal atrial fibrillation Facility Procedures CPT4 Code Description: 11572620 99213 - WOUND CARE VISIT-LEV 3 EST PT Modifier: Quantity: 1 CPT4 Code Description: 35597416 (Facility Use Only) 38453MI - APPLY MULTLAY COMPRS LWR RT LEG Modifier: Quantity: 1 Physician Procedures CPT4 Code Description: 6803212 24825 - WC PHYS LEVEL 4 - NEW PT ICD-10 Diagnosis Description I87.2 Venous insufficiency (chronic) (peripheral) 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

## 2018-07-24 NOTE — Progress Notes (Signed)
Erin [name], Erin [name] ([id number]) Visit Report for 07/22/2018 Allergy List Details Patient Name: Erin [name], Erin [name]. Date of Service: 07/22/2018 9:45 AM Medical Record Number: [id number] Patient Account Number: [id number] Date of Birth/Sex: 09-Mar-1945 (73 y.o. F) Treating RN: Erin [name] Primary Care Erin [name]: PATIENT, NO Other Clinician: Referring Erin [name]: Referral, Self Treating Erin [name]/Extender: STONE III, Erin [name] Weeks in Treatment: 0 Allergies Active Allergies No known Allergies Allergy Notes Electronic Signature(s) Signed: 07/22/2018 11:37:09 AM By: Erin [name] Entered By: Erin [name] on [name] 09:48:53 [name], Erin [name] ([id number]) -------------------------------------------------------------------------------- Arrival Information Details Patient Name: Erin [name]. Date of Service: 07/22/2018 9:45 AM Medical Record Number: [id number] Patient Account Number: [id number] Date of Birth/Sex: 1945-02-18 (73 y.o. F) Treating RN: Erin [name] Primary Care Erin [name]: PATIENT, NO Other Clinician: Referring Erin [name]: Referral, Self Treating Brylan Dec/Extender: Erin [name], Erin [name] Weeks in Treatment: 0 Visit Information Patient Arrived: Walker Arrival Time: 09:41 Accompanied By: daughter Transfer Assistance: None Electronic Signature(s) Signed: 07/22/2018 11:37:09 AM By: Erin [name] Entered By: Erin [name] on [name] 09:42:06 Erin [name], Erin [name] ([id number]) -------------------------------------------------------------------------------- Clinic Level of Care Assessment Details Patient Name: Erin [name]. Date of Service: 07/22/2018 9:45 AM Medical Record Number: [id number] Patient Account Number: [id number] Date of Birth/Sex: 19-Sep-1945 (73 y.o. F) Treating RN: Erin [name] Primary Care Erin [name]: PATIENT, NO Other Clinician: Referring Erin [name]: Referral, Self Treating Erin [name]/Extender: STONE III, Erin [name] Weeks in Treatment: 0 Clinic Level of Care Assessment Items TOOL 1  Quantity Score []  - Use when EandM and Procedure is performed on INITIAL visit 0 ASSESSMENTS - Nursing Assessment / Reassessment X - General Physical Exam (combine w/ comprehensive assessment (listed just below) when 1 20 performed on new pt. evals) X- 1 25 Comprehensive Assessment (HX, ROS, Risk Assessments, Wounds Hx, etc.) ASSESSMENTS - Wound and Skin Assessment / Reassessment []  - Dermatologic / Skin Assessment (not related to wound area) 0 ASSESSMENTS - Ostomy and/or Continence Assessment and Care []  - Incontinence Assessment and Management 0 []  - 0 Ostomy Care Assessment and Management (repouching, etc.) PROCESS - Coordination of Care X - Simple Patient / Family Education for ongoing care 1 15 []  - 0 Complex (extensive) Patient / Family Education for ongoing care X- 1 10 Staff obtains Programmer, systems, Records, Test Results / Process Orders []  - 0 Staff telephones HHA, Nursing Homes / Clarify orders / etc []  - 0 Routine Transfer to another Facility (non-emergent condition) []  - 0 Routine Hospital Admission (non-emergent condition) X- 1 15 New Admissions / Biomedical engineer / Ordering NPWT, Apligraf, etc. []  - 0 Emergency Hospital Admission (emergent condition) PROCESS - Special Needs []  - Pediatric / Minor Patient Management 0 []  - 0 Isolation Patient Management []  - 0 Hearing / Language / Visual special needs []  - 0 Assessment of Community assistance (transportation, D/C planning, etc.) []  - 0 Additional assistance / Altered mentation []  - 0 Support Surface(s) Assessment (bed, cushion, seat, etc.) [name], Erin [name]. ([id number]) INTERVENTIONS - Miscellaneous []  - External ear exam 0 []  - 0 Patient Transfer (multiple staff / Civil Service fast streamer / Similar devices) []  - 0 Simple Staple / Suture removal (25 or less) []  - 0 Complex Staple / Suture removal (26 or more) []  - 0 Hypo/Hyperglycemic Management (do not check if billed separately) X- 1 15 Ankle / Brachial Index  (ABI) - do not check if billed separately Has the patient been seen at the hospital within the last three years: Yes Total Score: 100 Level Of Care: New/Established - Level 3 Electronic Signature(s) Signed: 07/22/2018 5:37:40 PM By: Erin [name] Entered  By: Erin [name], Erin [name] 10:21:13 Erin [name], Erin [name]. ([id number]) -------------------------------------------------------------------------------- Compression Therapy Details Patient Name: Erin [name], Erin [name]. Date of Service: 07/22/2018 9:45 AM Medical Record Number: [id number] Patient Account Number: [id number] Date of Birth/Sex: 05/19/1945 (73 y.o. F) Treating RN: Erin [name], Erin Primary Care Erin [name]: PATIENT, NO Other Clinician: Referring Erin [name]: Referral, Self Treating Erin [name]/Extender: STONE III, Erin [name] Weeks in Treatment: 0 Compression Therapy Performed for Wound Assessment: Wound #1 Right,Lateral Lower Leg Performed By: Clinician Erin [name], Joanna, RN Compression Type: Three Layer Pre Treatment ABI: 1 Post Procedure Diagnosis Same as Pre-procedure Electronic Signature(s) Signed: 07/22/2018 5:37:40 PM By: Erin [name], Erin Entered By: Erin [name], Erin [name] 10:17:48 Erin [name], Erin [name]. ([id number]) -------------------------------------------------------------------------------- Encounter Discharge Information Details Patient Name: Erin [name], Erin [name]. Date of Service: 07/22/2018 9:45 AM Medical Record Number: [id number] Patient Account Number: [id number] Date of Birth/Sex: 08/14/1945 (73 y.o. F) Treating RN: Erin [name], Erin Primary Care Iyana Topor: PATIENT, NO Other Clinician: Referring Kamali Sakata: Referral, Self Treating Erin [name]: STONE III, Erin [name] Weeks in Treatment: 0 Encounter Discharge Information Items Discharge Condition: Stable Ambulatory Status: Walker Discharge Destination: Home Transportation: Private Auto Accompanied By: daughter Schedule Follow-up Appointment: Yes Clinical Summary of Care: Electronic  Signature(s) Signed: 07/22/2018 5:37:40 PM By: Erin [name], Erin Entered By: Erin [name], Erin [name] 10:24:46 Erin [name], Erin [name]. ([id number]) -------------------------------------------------------------------------------- Lower Extremity Assessment Details Patient Name: Erin [name], Erin [name]. Date of Service: 07/22/2018 9:45 AM Medical Record Number: [id number] Patient Account Number: [id number] Date of Birth/Sex: 05/31/1945 (73 y.o. F) Treating RN: Rodell PernaScott, Dajea Primary Care Perley Arthurs: PATIENT, NO Other Clinician: Referring Genavive Kubicki: Referral, Self Treating Anessa Charley/Extender: STONE III, Erin [name] Weeks in Treatment: 0 Edema Assessment Assessed: [Left: No] [Right: No] Edema: [Left: N] [Right: o] Calf Left: Right: Point of Measurement: 33 cm From Medial Instep cm 34.6 cm Ankle Left: Right: Point of Measurement: 9 cm From Medial Instep cm 22.5 cm Vascular Assessment Pulses: Dorsalis Pedis Palpable: [Right:Yes] Blood Pressure: Brachial: [Right:126] Dorsalis Pedis: 120 Ankle: Posterior Tibial: 120 Ankle Brachial Index: [Right:0.95] Electronic Signature(s) Signed: 07/22/2018 11:37:09 AM By: Rodell PernaScott, Dajea Entered By: Rodell PernaScott, Dajea on [name] 09:50:57 [name], Arlana PouchJULIA [name]. ([id number]) -------------------------------------------------------------------------------- Multi Wound Chart Details Patient Name: Erin [name], Nasirah [name]. Date of Service: 07/22/2018 9:45 AM Medical Record Number: [id number] Patient Account Number: [id number] Date of Birth/Sex: 08/02/1945 (73 y.o. F) Treating RN: Erin [name], Erin Primary Care Tamari Redwine: PATIENT, NO Other Clinician: Referring Rubbie Goostree: Referral, Self Treating Baylin Cabal/Extender: STONE III, Erin [name] Weeks in Treatment: 0 Vital Signs Height(in): 60 Pulse(bpm): 77 Weight(lbs): 213 Blood Pressure(mmHg): 126/47 Body Mass Index(BMI): 42 Temperature(F): 97.9 Respiratory Rate 16 (breaths/min): Photos: [N/A:N/A] Wound Location: Right Lower Leg - Lateral N/A  N/A Wounding Event: Blister N/A N/A Primary Etiology: Venous Leg Ulcer N/A N/A Date Acquired: 07/01/2018 N/A N/A Weeks of Treatment: 0 N/A N/A Wound Status: Open N/A N/A Measurements L x W x D 11.8x10x0.1 N/A N/A (cm) Area (cm) : 92.677 N/A N/A Volume (cm) : 9.268 N/A N/A Classification: Partial Thickness N/A N/A Exudate Amount: Large N/A N/A Exudate Type: Serous N/A N/A Exudate Color: amber N/A N/A Granulation Amount: Medium (34-66%) N/A N/A Granulation Quality: Pink N/A N/A Necrotic Amount: Medium (34-66%) N/A N/A Necrotic Tissue: Eschar, Adherent Slough N/A N/A Exposed Structures: Fat Layer (Subcutaneous N/A N/A Tissue) Exposed: Yes Fascia: No Tendon: No Muscle: No Joint: No Bone: No Epithelialization: None N/A N/A Treatment Notes Erin [name], Erin [name]. ([id number]) Electronic Signature(s) Signed: 07/22/2018 5:37:40 PM By: Erin [name], Erin Entered By: Erin [name], Erin [name] 10:13:27 Erin [name], Erin [name]. ([id number]) -------------------------------------------------------------------------------- Multi-Disciplinary Care Plan Details Patient Name: Erin [name], Kelleen [name]. Date of Service:  07/22/2018 9:45 AM Medical Record Number: [id number] Patient Account Number: [id number] Date of Birth/Sex: 09-Feb-1945 (73 y.o. F) Treating RN: Erin Sites Primary Care Chamille Werntz: PATIENT, NO Other Clinician: Referring Leyton Magoon: Referral, Self Treating Jannat Rosemeyer/Extender: STONE III, Erin [name] Weeks in Treatment: 0 Active Inactive Abuse / Safety / Falls / Self Care Management Nursing Diagnoses: Potential for falls Goals: Patient will remain injury free related to falls Date Initiated: 07/22/2018 Target Resolution Date: 11/04/2018 Goal Status: Active Interventions: Assess fall risk on admission and as needed Notes: Orientation to the Wound Care Program Nursing Diagnoses: Knowledge deficit related to the wound healing center program Goals: Patient/caregiver will verbalize understanding of the  Wound Healing Center Program Date Initiated: 07/22/2018 Target Resolution Date: 11/04/2018 Goal Status: Active Interventions: Provide education on orientation to the wound center Notes: Venous Leg Ulcer Nursing Diagnoses: Potential for venous Insuffiency (use before diagnosis confirmed) Goals: Patient will maintain optimal edema control Date Initiated: 07/22/2018 Target Resolution Date: 11/04/2018 Goal Status: Active Interventions: Compression as ordered GORDANA, MOMENT ([id number]) Notes: Wound/Skin Impairment Nursing Diagnoses: Impaired tissue integrity Goals: Ulcer/skin breakdown will heal within 14 weeks Date Initiated: 07/22/2018 Target Resolution Date: 11/04/2018 Goal Status: Active Interventions: Assess patient/caregiver ability to obtain necessary supplies Assess patient/caregiver ability to perform ulcer/skin care regimen upon admission and as needed Assess ulceration(s) every visit Notes: Electronic Signature(s) Signed: 07/22/2018 5:37:40 PM By: Erin Sites Entered By: Erin Sites on [name] 10:13:08 Erin Bowens ([id number]) -------------------------------------------------------------------------------- Pain Assessment Details Patient Name: Erin Bowens. Date of Service: 07/22/2018 9:45 AM Medical Record Number: [id number] Patient Account Number: [id number] Date of Birth/Sex: 10-23-1945 (73 y.o. F) Treating RN: Rodell Perna Primary Care Elmin Wiederholt: PATIENT, NO Other Clinician: Referring Senaya Dicenso: Referral, Self Treating Shaughn Thomley/Extender: STONE III, Erin [name] Weeks in Treatment: 0 Active Problems Location of Pain Severity and Description of Pain Patient Has Paino No Site Locations Pain Management and Medication Current Pain Management: Electronic Signature(s) Signed: 07/22/2018 11:37:09 AM By: Rodell Perna Entered By: Rodell Perna on [name] 09:42:40 Bergin, Arlana Pouch  ([id number]) -------------------------------------------------------------------------------- Patient/Caregiver Education Details Patient Name: Erin Bowens. Date of Service: 07/22/2018 9:45 AM Medical Record Number: [id number] Patient Account Number: [id number] Date of Birth/Gender: [birth year] (74 y.o. F) Treating RN: Erin Sites Primary Care Physician: PATIENT, NO Other Clinician: Referring Physician: Referral, Self Treating Physician/Extender: Linwood Dibbles, Erin [name] Weeks in Treatment: 0 Education Assessment Education Provided To: Patient and Caregiver Education Topics Provided Venous: Handouts: Other: need for ongoing compression Methods: Explain/Verbal Responses: State content correctly Electronic Signature(s) Signed: 07/22/2018 5:37:40 PM By: Erin Sites Entered By: Erin Sites on [name] 10:21:48 Erin Bowens ([id number]) -------------------------------------------------------------------------------- Wound Assessment Details Patient Name: Erin Bowens. Date of Service: 07/22/2018 9:45 AM Medical Record Number: [id number] Patient Account Number: [id number] Date of Birth/Sex: 06/30/1945 (73 y.o. F) Treating RN: Rodell Perna Primary Care Dwight Burdo: PATIENT, NO Other Clinician: Referring Haliey Romberg: Referral, Self Treating Rodel Glaspy/Extender: STONE III, Erin [name] Weeks in Treatment: 0 Wound Status Wound Number: 1 Primary Etiology: Venous Leg Ulcer Wound Location: Right Lower Leg - Lateral Wound Status: Open Wounding Event: Blister Date Acquired: 07/01/2018 Weeks Of Treatment: 0 Clustered Wound: No Photos Wound Measurements Length: (cm) 11.8 % Reduction Width: (cm) 10 % Reduction Depth: (cm) 0.1 Epithelializ Area: (cm) 92.677 Tunneling: Volume: (cm) 9.268 Undermining in Area: in Volume: ation: None No : No Wound Description Classification: Partial Thickness Foul Odor Af Exudate Amount: Large Slough/Fibri Exudate Type: Serous Exudate Color:  amber ter Cleansing: No no Yes Wound Bed Granulation Amount: Medium (34-66%) Exposed Structure Granulation Quality: Pink Fascia Exposed:  No Necrotic Amount: Medium (34-66%) Fat Layer (Subcutaneous Tissue) Exposed: Yes Necrotic Quality: Eschar, Adherent Slough Tendon Exposed: No Muscle Exposed: No Joint Exposed: No Bone Exposed: No Treatment Notes Wound #1 (Right, Lateral Lower Leg) Tellefsen, Sigourney [name]. ([id number]) Notes silvercel, xtrasorb, 3 layer wrap Electronic Signature(s) Signed: 07/22/2018 11:37:09 AM By: Rodell PernaScott, Dajea Entered By: Rodell PernaScott, Dajea on [name] 09:47:36 [place], Arlana PouchJULIA [name]. ([id number]) -------------------------------------------------------------------------------- Vitals Details Patient Name: Erin [name], Kela [name]. Date of Service: 07/22/2018 9:45 AM Medical Record Number: [id number] Patient Account Number: [id number] Date of Birth/Sex: 11/02/1945 (73 y.o. F) Treating RN: Rodell PernaScott, Dajea Primary Care Deuntae Kocsis: PATIENT, NO Other Clinician: Referring Zaeda Mcferran: Referral, Self Treating Elka Satterfield/Extender: STONE III, Erin [name] Weeks in Treatment: 0 Vital Signs Time Taken: 09:42 Temperature (F): 97.9 Height (in): 60 Pulse (bpm): 77 Source: Stated Respiratory Rate (breaths/min): 16 Weight (lbs): 213 Blood Pressure (mmHg): 126/47 Source: Stated Reference Range: 80 - 120 mg / dl Body Mass Index (BMI): 41.6 Electronic Signature(s) Signed: 07/22/2018 11:37:09 AM By: Rodell PernaScott, Dajea Entered By: Rodell PernaScott, Dajea on [name] 09:43:03

## 2018-07-29 ENCOUNTER — Other Ambulatory Visit: Payer: Self-pay

## 2018-07-29 ENCOUNTER — Encounter: Payer: Medicare Other | Admitting: Physician Assistant

## 2018-07-29 ENCOUNTER — Other Ambulatory Visit
Admission: RE | Admit: 2018-07-29 | Discharge: 2018-07-29 | Disposition: A | Discharge status: Home / Self Care | Payer: Medicare Other | Source: Ambulatory Visit | Attending: Physician Assistant | Admitting: Physician Assistant

## 2018-07-29 DIAGNOSIS — L97812 Non-pressure chronic ulcer of other part of right lower leg with fat layer exposed: Secondary | ICD-10-CM | POA: Diagnosis not present

## 2018-07-29 NOTE — Progress Notes (Signed)
Erin [name], Lisamarie M. ([id number]) Visit Report for 07/29/2018 Arrival Information Details Patient Name: Erin [name], [name] Date of Service: 07/29/2018 9:15 AM Medical Record Number: [id number] Patient Account Number: [id number] Date of Birth/Sex: 08/22/1945 (73 y.o. F) Treating RN: Rodell PernaScott, Dajea Primary Care Kalah Pflum: PATIENT, NO Other Clinician: Referring Jeson Camacho: Referral, Self Treating Sakara Lehtinen/Extender: STONE III, HOYT Weeks in Treatment: 1 Visit Information History Since Last Visit Added or deleted any medications: No Patient Arrived: Walker Any new allergies or adverse reactions: No Arrival Time: 09:28 Had a fall or experienced change in No Accompanied By: daughter activities of daily living that may affect Transfer Assistance: None risk of falls: Signs or symptoms of abuse/neglect since last visito No Hospitalized since last visit: No Has Dressing in Place as Prescribed: Yes Pain Present Now: No Electronic Signature(s) Signed: 07/29/2018 11:47:58 AM By: Rodell PernaScott, Dajea Entered By: Rodell PernaScott, Dajea on 07/29/2018 09:29:02 Erin [name], Kaitlen M. ([id number]) -------------------------------------------------------------------------------- Compression Therapy Details Patient Name: Erin [name], [name] Date of Service: 07/29/2018 9:15 AM Medical Record Number: [id number] Patient Account Number: [id number] Date of Birth/Sex: 01/24/1945 (73 y.o. F) Treating RN: Curtis Sitesorthy, Joanna Primary Care Arnola Crittendon: PATIENT, NO Other Clinician: Referring Arminta Gamm: Referral, Self Treating Freddy Spadafora/Extender: STONE III, HOYT Weeks in Treatment: 1 Compression Therapy Performed for Wound Assessment: Wound #1 Right,Lateral Lower Leg Performed By: Clinician Curtis Sitesorthy, Joanna, RN Compression Type: Three Layer Pre Treatment ABI: 1 Post Procedure Diagnosis Same as Pre-procedure Electronic Signature(s) Signed: 07/29/2018 4:09:36 PM By: Curtis Sitesorthy, Joanna Entered By: Curtis Sitesorthy, Joanna on 07/29/2018 09:51:39 Troiano, [name]  ([id number]) -------------------------------------------------------------------------------- Encounter Discharge Information Details Patient Name: Erin [name], [name] Date of Service: 07/29/2018 9:15 AM Medical Record Number: [id number] Patient Account Number: [id number] Date of Birth/Sex: 11/19/1945 (73 y.o. F) Treating RN: Curtis Sitesorthy, Joanna Primary Care Izabela Ow: PATIENT, NO Other Clinician: Referring Taji Barretto: Referral, Self Treating Courtenay Hirth/Extender: STONE III, HOYT Weeks in Treatment: 1 Encounter Discharge Information Items Discharge Condition: Stable Ambulatory Status: Walker Discharge Destination: Home Transportation: Private Auto Accompanied By: daughter Schedule Follow-up Appointment: Yes Clinical Summary of Care: Provided Form Type Recipient Paper Patient jh Electronic Signature(s) Signed: 07/29/2018 11:59:18 AM By: Dayton MartesWallace, RCP,RRT,CHT, Sallie RCP, RRT, CHT Previous Signature: 07/29/2018 10:05:18 AM Version By: Dayton MartesWallace, RCP,RRT,CHT, Sallie RCP, RRT, CHT Entered By: Dayton MartesWallace, RCP,RRT,CHT, Sallie on 07/29/2018 10:06:57 Erin [name], [name] ([id number]) -------------------------------------------------------------------------------- Lower Extremity Assessment Details Patient Name: Erin [name], [name] Date of Service: 07/29/2018 9:15 AM Medical Record Number: [id number] Patient Account Number: [id number] Date of Birth/Sex: 08/21/1945 (73 y.o. F) Treating RN: Rodell PernaScott, Dajea Primary Care Shawndell Schillaci: PATIENT, NO Other Clinician: Referring Quiana Cobaugh: Referral, Self Treating Tessah Patchen/Extender: STONE III, HOYT Weeks in Treatment: 1 Edema Assessment Assessed: [Left: No] [Right: No] Edema: [Left: N] [Right: o] Vascular Assessment Pulses: Dorsalis Pedis Palpable: [Right:Yes] Electronic Signature(s) Signed: 07/29/2018 11:47:58 AM By: Rodell PernaScott, Dajea Entered By: Rodell PernaScott, Dajea on 07/29/2018 09:39:44 Pelley, Bryson M.  ([id number]) -------------------------------------------------------------------------------- Multi Wound Chart Details Patient Name: Erin [name], [name] Date of Service: 07/29/2018 9:15 AM Medical Record Number: [id number] Patient Account Number: [id number] Date of Birth/Sex: 01/25/1945 (73 y.o. F) Treating RN: Curtis Sitesorthy, Joanna Primary Care Azizah Lisle: PATIENT, NO Other Clinician: Referring Eugune Sine: Referral, Self Treating Dewayne Jurek/Extender: STONE III, HOYT Weeks in Treatment: 1 Vital Signs Height(in): 60 Pulse(bpm): 83 Weight(lbs): 213 Blood Pressure(mmHg): 123/59 Body Mass Index(BMI): 42 Temperature(F): 98.2 Respiratory Rate 16 (breaths/min): Photos: [N/A:N/A] Wound Location: Right Lower Leg - Lateral N/A N/A Wounding Event: Blister N/A N/A Primary Etiology: Venous Leg Ulcer N/A N/A Comorbid History: Congestive Heart Failure, N/A N/A Coronary Artery Disease, Hypertension Date Acquired: 07/01/2018 N/A N/A Weeks of  Treatment: 1 N/A N/A Wound Status: Open N/A N/A Measurements L x W x D 9x3.5x0.1 N/A N/A (cm) Area (cm) : 24.74 N/A N/A Volume (cm) : 2.474 N/A N/A % Reduction in Area: 73.30% N/A N/A % Reduction in Volume: 73.30% N/A N/A Classification: Partial Thickness N/A N/A Exudate Amount: Medium N/A N/A Exudate Type: Serous N/A N/A Exudate Color: amber N/A N/A Granulation Amount: Medium (34-66%) N/A N/A Granulation Quality: Pink N/A N/A Necrotic Amount: Small (1-33%) N/A N/A Necrotic Tissue: Eschar, Adherent Slough N/A N/A Exposed Structures: Fat Layer (Subcutaneous N/A N/A Tissue) Exposed: Yes Fascia: No Tendon: No Muscle: No DALYA, MASELLI ([id number]) Joint: No Bone: No Epithelialization: Large (67-100%) N/A N/A Treatment Notes Electronic Signature(s) Signed: 07/29/2018 4:09:36 PM By: Montey Hora Entered By: Montey Hora on 07/29/2018 09:46:37 Yorio, Placido Sou  ([id number]) -------------------------------------------------------------------------------- [place] Details Patient Name: [name]. Date of Service: 07/29/2018 9:15 AM Medical Record Number: [id number] Patient Account Number: [id number] Date of Birth/Sex: August 17, 1945 (73 y.o. F) Treating RN: Montey Hora Primary Care Noya Santarelli: PATIENT, NO Other Clinician: Referring Brydon Spahr: Referral, Self Treating Calvary Difranco/Extender: STONE III, HOYT Weeks in Treatment: 1 Active Inactive Abuse / Safety / Falls / Self Care Management Nursing Diagnoses: Potential for falls Goals: Patient will remain injury free related to falls Date Initiated: 07/22/2018 Target Resolution Date: 11/04/2018 Goal Status: Active Interventions: Assess fall risk on admission and as needed Notes: Orientation to the Wound Care Program Nursing Diagnoses: Knowledge deficit related to the wound healing center program Goals: Patient/caregiver will verbalize understanding of the [facility] Program Date Initiated: 07/22/2018 Target Resolution Date: 11/04/2018 Goal Status: Active Interventions: Provide education on orientation to the wound center Notes: Venous Leg Ulcer Nursing Diagnoses: Potential for venous Insuffiency (use before diagnosis confirmed) Goals: Patient will maintain optimal edema control Date Initiated: 07/22/2018 Target Resolution Date: 11/04/2018 Goal Status: Active Interventions: Compression as ordered DAYLIN, EADS ([id number]) Notes: Wound/Skin Impairment Nursing Diagnoses: Impaired tissue integrity Goals: Ulcer/skin breakdown will heal within 14 weeks Date Initiated: 07/22/2018 Target Resolution Date: 11/04/2018 Goal Status: Active Interventions: Assess patient/caregiver ability to obtain necessary supplies Assess patient/caregiver ability to perform ulcer/skin care regimen upon admission and as needed Assess ulceration(s) every  visit Notes: Electronic Signature(s) Signed: 07/29/2018 4:09:36 PM By: Montey Hora Entered By: Montey Hora on 07/29/2018 09:46:30 Rasheed, Placido Sou ([id number]) -------------------------------------------------------------------------------- Pain Assessment Details Patient Name: [name]. Date of Service: 07/29/2018 9:15 AM Medical Record Number: [id number] Patient Account Number: [id number] Date of Birth/Sex: 08/08/1945 (73 y.o. F) Treating RN: Army Melia Primary Care Tymesha Ditmore: PATIENT, NO Other Clinician: Referring Britnie Colville: Referral, Self Treating Mida Cory/Extender: STONE III, HOYT Weeks in Treatment: 1 Active Problems Location of Pain Severity and Description of Pain Patient Has Paino No Site Locations Pain Management and Medication Current Pain Management: Electronic Signature(s) Signed: 07/29/2018 11:47:58 AM By: Army Melia Entered By: Army Melia on 07/29/2018 09:29:25 [name] ([id number]) -------------------------------------------------------------------------------- Patient/Caregiver Education Details Patient Name: [name]. Date of Service: 07/29/2018 9:15 AM Medical Record Number: [id number] Patient Account Number: [id number] Date of Birth/Gender: [birth year] (73 y.o. F) Treating RN: Montey Hora Primary Care Physician: PATIENT, NO Other Clinician: Referring Physician: Referral, Self Treating Physician/Extender: Melburn Hake, HOYT Weeks in Treatment: 1 Education Assessment Education Provided To: Patient Education Topics Provided Venous: Handouts: Other: continue compression on left leg Methods: Explain/Verbal Responses: State content correctly Electronic Signature(s) Signed: 07/29/2018 4:09:36 PM By: Montey Hora Entered By: Montey Hora on 07/29/2018 09:52:23 Nordstrom, Placido Sou ([id number]) -------------------------------------------------------------------------------- Wound Assessment Details Patient Name:  Hermann,  Kenleigh M. Date of Service: 07/29/2018 9:15 AM Medical Record Number: [id number] Patient Account Number: [id number] Date of Birth/Sex: 03-24-1945 (73 y.o. F) Treating RN: Rodell Perna Primary Care Clarke Amburn: PATIENT, NO Other Clinician: Referring Quante Pettry: Referral, Self Treating Emmanuella Mirante/Extender: STONE III, HOYT Weeks in Treatment: 1 Wound Status Wound Number: 1 Primary Venous Leg Ulcer Etiology: Wound Location: Right Lower Leg - Lateral Wound Status: Open Wounding Event: Blister Comorbid Congestive Heart Failure, Coronary Artery Date Acquired: 07/01/2018 History: Disease, Hypertension Weeks Of Treatment: 1 Clustered Wound: No Photos Wound Measurements Length: (cm) 9 Width: (cm) 3.5 Depth: (cm) 0.1 Area: (cm) 24.74 Volume: (cm) 2.474 % Reduction in Area: 73.3% % Reduction in Volume: 73.3% Epithelialization: Large (67-100%) Tunneling: No Undermining: No Wound Description Classification: Partial Thickness Foul Odor Af Exudate Amount: Medium Slough/Fibri Exudate Type: Serous Exudate Color: amber ter Cleansing: No no Yes Wound Bed Granulation Amount: Medium (34-66%) Exposed Structure Granulation Quality: Pink Fascia Exposed: No Necrotic Amount: Small (1-33%) Fat Layer (Subcutaneous Tissue) Exposed: Yes Necrotic Quality: Eschar, Adherent Slough Tendon Exposed: No Muscle Exposed: No Joint Exposed: No Bone Exposed: No Treatment Notes Wound #1 (Right, Lateral Lower Leg) Walling, Palma M. ([id number]) Notes silvercel, abd, 3 layer wrap with unna to anchor Electronic Signature(s) Signed: 07/29/2018 11:47:58 AM By: Rodell Perna Entered By: Rodell Perna on 07/29/2018 09:39:30 Carras, Arlana Pouch ([id number]) -------------------------------------------------------------------------------- Vitals Details Patient Name: Erin Bowens. Date of Service: 07/29/2018 9:15 AM Medical Record Number: [id number] Patient Account Number: [id number] Date of Birth/Sex: 26-May-1945 (73  y.o. F) Treating RN: Rodell Perna Primary Care Danasha Melman: PATIENT, NO Other Clinician: Referring Ariany Kesselman: Referral, Self Treating Edona Schreffler/Extender: STONE III, HOYT Weeks in Treatment: 1 Vital Signs Time Taken: 09:37 Temperature (F): 98.2 Height (in): 60 Pulse (bpm): 83 Weight (lbs): 213 Respiratory Rate (breaths/min): 16 Body Mass Index (BMI): 41.6 Blood Pressure (mmHg): 123/59 Reference Range: 80 - 120 mg / dl Electronic Signature(s) Signed: 07/29/2018 11:47:58 AM By: Rodell Perna Entered By: Rodell Perna on 07/29/2018 09:37:45

## 2018-07-29 NOTE — Progress Notes (Addendum)
Erin [name], [name] ([id number]) Visit Report for 07/29/2018 Chief Complaint Document Details Patient Name: Erin [name], [name] Date of Service: 07/29/2018 9:15 AM Medical Record Number: [id number] Patient Account Number: [id number] Date of Birth/Sex: 03/14/1945 (73 y.o. F) Treating RN: Erin [name] Primary Care Provider: PATIENT, NO Other Clinician: Referring Provider: Referral, Self Treating Provider/Extender: Linwood DibblesSTONE III, HOYT Weeks in Treatment: 1 Information Obtained from: Patient Chief Complaint Right LE Ulcer Electronic Signature(s) Signed: 07/29/2018 4:51:26 PM By: Lenda KelpStone III, Hoyt PA-C Entered By: Lenda KelpStone III, Hoyt on 07/29/2018 09:43:32 Fitz, Erin PouchJULIA M. ([id number]) -------------------------------------------------------------------------------- HPI Details Patient Name: Erin [name], [name] Date of Service: 07/29/2018 9:15 AM Medical Record Number: [id number] Patient Account Number: [id number] Date of Birth/Sex: 02/01/1945 (73 y.o. F) Treating RN: Erin [name] Primary Care Provider: PATIENT, NO Other Clinician: Referring Provider: Referral, Self Treating Provider/Extender: STONE III, HOYT Weeks in Treatment: 1 History of Present Illness HPI Description: 07/22/18 patient presents today for initial evaluation regarding issues that she is having with wounds on the right lateral lower leg. The good news is that these actually appear to be showing signs of already feeling which is good news. There still a lot of maceration and drainage as well as edema noted at this point. This is something that from the edema standpoint has been going on for some time the patient obviously as lymphedema among issues with venous stasis as well. With that being said I do believe at this point that she's having some discomfort at the site fortunately there's no evidence of active infection at this time which is good news. The patient has been having issues as well it sounds like with something completely  separate in regard to pain she's unable to raise her legs and elevate them due to the fact that whenever she does she has a burning pain that goes into her right hip laterally and then runs down into her foot in times as well. This actually appears to be something emanating from her back which I discussed with her today as well I think that's completely separate from the chronic venous stasis and lymphedema which we are going to be managing at this point. No fevers, chills, nausea, or vomiting noted at this time. 07/29/18 on evaluation today patient appears to be doing better in regard to the overall wound size which is good news. With that being said she is still having a significant amount of pain some of this is directly related to be open wound and even to the fact that there might be a little bit of infection at this point. With that being said things are not looking too poorly at this point which is good news. I did obtain a culture today to test this further. With that being said with regard to the orthopedic referral this has been sent they have not heard from the office as of yet. Advised that they do not hear anything by Monday that they give the office a call. Electronic Signature(s) Signed: 07/29/2018 4:51:26 PM By: Lenda KelpStone III, Hoyt PA-C Entered By: Lenda KelpStone III, Hoyt on 07/29/2018 09:51:50 Erin [name], [name] ([id number]) -------------------------------------------------------------------------------- Physical Exam Details Patient Name: Erin [name], [name] Date of Service: 07/29/2018 9:15 AM Medical Record Number: [id number] Patient Account Number: [id number] Date of Birth/Sex: 03/23/1945 (73 y.o. F) Treating RN: Erin [name] Primary Care Provider: PATIENT, NO Other Clinician: Referring Provider: Referral, Self Treating Provider/Extender: STONE III, HOYT Weeks in Treatment: 1 Constitutional Well-nourished and well-hydrated in no acute distress. Respiratory normal breathing without  difficulty. clear to auscultation  bilaterally. Cardiovascular regular rate and rhythm with normal S1, S2. Psychiatric this patient is able to make decisions and demonstrates good insight into disease process. Alert and Oriented x 3. pleasant and cooperative. Notes Patient's wound bed currently showed signs of good granulation at this time. Fortunately there does not appear to be any evidence of active infection which is great news. No fevers chills noted. Patient does seem to be much better in regard to the swelling and again the overall wound appearance. There is some erythema and want to touch that does have me concerned about early infection I would like to go ahead and start something for that in this regard. Patient is in agreement with that plan. Electronic Signature(s) Signed: 07/29/2018 4:51:26 PM By: Lenda Kelp PA-C Entered By: Lenda Kelp on 07/29/2018 09:52:23 Erin [name], Erin [name] ([id number]) -------------------------------------------------------------------------------- Physician Orders Details Patient Name: Erin [name], [name] Date of Service: 07/29/2018 9:15 AM Medical Record Number: [id number] Patient Account Number: [id number] Date of Birth/Sex: Nov 10, 1945 (73 y.o. F) Treating RN: Erin Sites Primary Care Provider: PATIENT, NO Other Clinician: Referring Provider: Referral, Self Treating Provider/Extender: STONE III, HOYT Weeks in Treatment: 1 Verbal / Phone Orders: No Diagnosis Coding ICD-10 Coding Code Description I87.2 I89.0 L97.812 N18.3 I50.42 I10 I48.0 Wound Cleansing Wound #1 Right,Lateral Lower Leg o Clean wound with Normal Saline. o May shower with protection. - Please do not get your wrap wet Primary Wound Dressing Wound #1 Right,Lateral Lower Leg o Silver Alginate Secondary Dressing Wound #1 Right,Lateral Lower Leg o ABD pad Dressing Change Frequency Wound #1 Right,Lateral Lower Leg o Change dressing every week Follow-up  Appointments o Return Appointment in 1 week. Edema Control Wound #1 Right,Lateral Lower Leg o 3 Layer Compression System - Right Lower Extremity Laboratory o Bacteria identified in Wound by Culture (MICRO) oooo LOINC Code: 6462-6 oooo Convenience Name: Wound culture routine Erin [name], Erin [name] ([id number]) Patient Medications Allergies: No Known Drug Allergies Notifications Medication Indication Start End doxycycline hyclate 07/29/2018 DOSE 1 - oral 100 mg capsule - 1 capsule oral taken 2 times a day for 14 days Cipro 08/02/2018 DOSE 1 - oral 500 mg tablet - 1 tablet oral taken 2 times a day for 14 days Electronic Signature(s) Signed: 08/03/2018 4:41:53 PM By: Erin Sites Signed: 08/07/2018 6:04:32 PM By: Lenda Kelp PA-C Previous Signature: 08/02/2018 4:54:12 PM Version By: Lenda Kelp PA-C Previous Signature: 07/29/2018 9:53:28 AM Version By: Lenda Kelp PA-C Entered By: Erin Sites on 08/03/2018 13:46:14 Hurtado, Erin [name] ([id number]) -------------------------------------------------------------------------------- Problem List Details Patient Name: SANAIYA, WELLIVER. Date of Service: 07/29/2018 9:15 AM Medical Record Number: [id number] Patient Account Number: [id number] Date of Birth/Sex: 1945/12/06 (73 y.o. 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 N18.3 Chronic kidney disease, stage 3 (moderate) 07/22/2018 No Yes I50.42 Chronic combined systolic (congestive) and diastolic 07/22/2018 No  Yes (congestive) heart failure I10 Essential (primary) hypertension 07/22/2018 No Yes I48.0 Paroxysmal atrial fibrillation 07/22/2018 No Yes Inactive Problems Resolved Problems Electronic Signature(s) Signed: 07/29/2018 4:51:26 PM By: Lenda Kelp PA-C Entered By: Lenda Kelp on 07/29/2018 09:43:11 Erin [name], Erin [name] ([id number]) -------------------------------------------------------------------------------- Progress Note Details Patient Name:  Erin [name], [name] Date of Service: 07/29/2018 9:15 AM Medical Record Number: [id number] Patient Account Number: [id number] Date of Birth/Sex: [birth year] (73 y.o. F) Treating RN: Montey Hora Primary Care Provider: PATIENT, NO Other Clinician: Referring Provider: Referral, Self Treating Provider/Extender: STONE III, HOYT Weeks in Treatment: 1 Subjective Chief Complaint Information obtained from Patient Right LE Ulcer History of Present Illness (HPI) 07/22/18 patient presents today for initial evaluation regarding issues that she is having with wounds on the right lateral lower leg. The good news is that these actually appear to be showing signs of already feeling which is good news. There still a lot of maceration and drainage as well as edema noted at this point. This is something that from the edema standpoint has been going on for some time the patient obviously as lymphedema among issues with venous stasis as well. With that being said I do believe at this point that she's having some discomfort at the site fortunately there's no evidence of active infection at this time which is good news. The patient has been having issues as well it sounds like with something completely separate in regard to pain she's unable to raise her legs and elevate them due to the fact that whenever she does she has a burning pain that goes into her right hip laterally and then runs down into her foot in times as well. This actually appears to be  something emanating from her back which I discussed with her today as well I think that's completely separate from the chronic venous stasis and lymphedema which we are going to be managing at this point. No fevers, chills, nausea, or vomiting noted at this time. 07/29/18 on evaluation today patient appears to be doing better in regard to the overall wound size which is good news. With that being said she is still having a significant amount of pain some of this is directly related to be open wound and even to the fact that there might be a little bit of infection at this point. With that being said things are not looking too poorly at this point which is good news. I did obtain a culture today to test this further. With that being said with regard to the orthopedic referral this has been sent they have not heard from the office as of yet. Advised that they do not hear anything by Monday that they give the office a call. Patient History Information obtained from Patient. Family History Cancer - Siblings, Diabetes - Father,Siblings, Heart Disease - Mother,Siblings, Hypertension - Mother, Lung Disease - Siblings, No family history of Hereditary Spherocytosis, Kidney Disease, Seizures, Stroke, Thyroid Problems, Tuberculosis. Social History Never smoker, Marital Status - Single, Alcohol Use - Never, Drug Use - No History, Caffeine Use - Never. Medical History Eyes Denies history of Cataracts, Glaucoma, Optic Neuritis Ear/Nose/Mouth/Throat Denies history of Chronic sinus problems/congestion, Middle ear problems Hematologic/Lymphatic Denies history of Anemia, Hemophilia, Human Immunodeficiency Virus, Lymphedema, Sickle Cell Disease Respiratory Denies history of Aspiration, Asthma, Chronic Obstructive Pulmonary Disease (COPD), Pneumothorax, Sleep Apnea, Tuberculosis Sandin, Maleiah M. ([id number]) Cardiovascular Patient has history of Congestive Heart Failure, Coronary Artery Disease,  Hypertension Denies history of Angina, Arrhythmia, Deep Vein Thrombosis, Hypotension, Myocardial Infarction, Peripheral Arterial Disease, Peripheral Venous Disease, Phlebitis, Vasculitis Gastrointestinal Denies history of Cirrhosis , Colitis, Crohn s, Hepatitis A, Hepatitis B, Hepatitis C Endocrine Denies history of Type I Diabetes, Type II Diabetes Genitourinary Denies history of End Stage Renal Disease Immunological Denies history of Lupus Erythematosus, Raynaud s,  Scleroderma Integumentary (Skin) Denies history of History of Burn, History of pressure wounds Musculoskeletal Denies history of Gout, Rheumatoid Arthritis, Osteoarthritis, Osteomyelitis Neurologic Denies history of Dementia, Neuropathy, Quadriplegia, Paraplegia, Seizure Disorder Oncologic Denies history of Received Chemotherapy, Received Radiation Psychiatric Denies history of Anorexia/bulimia, Confinement Anxiety Review of Systems (ROS) Constitutional Symptoms (General Health) Denies complaints or symptoms of Fatigue, Fever, Chills, Marked Weight Change. Respiratory Denies complaints or symptoms of Chronic or frequent coughs, Shortness of Breath. Cardiovascular Complains or has symptoms of LE edema. Denies complaints or symptoms of Chest pain. Psychiatric Denies complaints or symptoms of Anxiety, Claustrophobia. Objective Constitutional Well-nourished and well-hydrated in no acute distress. Vitals Time Taken: 9:37 AM, Height: 60 in, Weight: 213 lbs, BMI: 41.6, Temperature: 98.2 F, Pulse: 83 bpm, Respiratory Rate: 16 breaths/min, Blood Pressure: 123/59 mmHg. Respiratory normal breathing without difficulty. clear to auscultation bilaterally. Cardiovascular regular rate and rhythm with normal S1, S2. Psychiatric this patient is able to make decisions and demonstrates good insight into disease process. Alert and Oriented x 3. pleasant and cooperative. Erin [name], Geselle .. ([id number]) General Notes: Patient's  wound bed currently showed signs of good granulation at this time. Fortunately there does not appear to be any evidence of active infection which is great news. No fevers chills noted. Patient does seem to be much better in regard to the swelling and again the overall wound appearance. There is some erythema and want to touch that does have me concerned about early infection I would like to go ahead and start something for that in this regard. Patient is in agreement with that plan. Integumentary (Hair, Skin) Wound #1 status is Open. Original cause of wound was Blister. The wound is located on the Right,Lateral Lower Leg. The wound measures 9cm length x 3.5cm width x 0.1cm depth; 24.74cm^2 area and 2.474cm^3 volume. There is Fat Layer (Subcutaneous Tissue) Exposed exposed. There is no tunneling or undermining noted. There is a medium amount of serous drainage noted. There is medium (34-66%) pink granulation within the wound bed. There is a small (1-33%) amount of necrotic tissue within the wound bed including Eschar and Adherent Slough. Assessment Active Problems ICD-10 Venous insufficiency (chronic) (peripheral) Lymphedema, not elsewhere classified Non-pressure chronic ulcer of other part of right lower leg with fat layer exposed Chronic kidney disease, stage 3 (moderate) Chronic combined systolic (congestive) and diastolic (congestive) heart failure Essential (primary) hypertension Paroxysmal atrial fibrillation Procedures Wound #1 Pre-procedure diagnosis of Wound #1 is a Venous Leg Ulcer located on the Right,Lateral Lower Leg . There was a Three Layer Compression Therapy Procedure with a pre-treatment ABI of 1 by Erin Sitesorthy, Joanna, RN. Post procedure Diagnosis Wound #1: Same as Pre-Procedure Plan Wound Cleansing: Wound #1 Right,Lateral Lower Leg: Clean wound with Normal Saline. May shower with protection. - Please do not get your wrap wet Primary Wound Dressing: Wound #1 Right,Lateral  Lower Leg: Silver Alginate Secondary Dressing: Erin [name], Jermany M. ([id number]) Wound #1 Right,Lateral Lower Leg: ABD pad Dressing Change Frequency: Wound #1 Right,Lateral Lower Leg: Change dressing every week Follow-up Appointments: Return Appointment in 1 week. Edema Control: Wound #1 Right,Lateral Lower Leg: 3 Layer Compression System - Right Lower Extremity The following medication(s) was prescribed: doxycycline hyclate oral 100 mg capsule 1 1 capsule oral taken 2 times a day for 14 days starting 07/29/2018 Cipro oral 500 mg tablet 1 1 tablet oral taken 2 times a day for 14 days starting 08/02/2018 I did obtain a wound culture today which was sent for further evaluation. In the meantime  I'm gonna go ahead and place her on antibiotics and specifically I sent in a prescription for doxycycline in order to help with the infection. If we need to make any changes based on the culture results we will do so at that point. Patient and her daughter are in agreement with plan. Otherwise we will see were things stand at follow-up. Please see above for specific wound care orders. We will see patient for re-evaluation in 1 week(s) here in the clinic. If anything worsens or changes patient will contact our office for additional recommendations. Patient is this continued doxycycline and start Cipro which was called in for her today. She cultured Enterobacter. She will be contacted tomorrow by nursing staff to let her know. Electronic Signature(s) Signed: 08/07/2018 6:04:32 PM By: Lenda KelpStone III, Hoyt PA-C Previous Signature: 07/29/2018 4:51:26 PM Version By: Lenda KelpStone III, Hoyt PA-C Entered By: Lenda KelpStone III, Hoyt on 08/02/2018 16:55:02 Washburn, Erin PouchJULIA M. ([id number]) -------------------------------------------------------------------------------- ROS/PFSH Details Patient Name: Erin [name], Kayly M. Date of Service: 07/29/2018 9:15 AM Medical Record Number: [id number] Patient Account Number: [id number] Date of  Birth/Sex: 01/20/1945 (73 y.o. F) Treating RN: Erin [name] Primary Care Provider: PATIENT, NO Other Clinician: Referring Provider: Referral, Self Treating Provider/Extender: STONE III, HOYT Weeks in Treatment: 1 Information Obtained From Patient Constitutional Symptoms (General Health) Complaints and Symptoms: Negative for: Fatigue; Fever; Chills; Marked Weight Change Respiratory Complaints and Symptoms: Negative for: Chronic or frequent coughs; Shortness of Breath Medical History: Negative for: Aspiration; Asthma; Chronic Obstructive Pulmonary Disease (COPD); Pneumothorax; Sleep Apnea; Tuberculosis Cardiovascular Complaints and Symptoms: Positive for: LE edema Negative for: Chest pain Medical History: Positive for: Congestive Heart Failure; Coronary Artery Disease; Hypertension Negative for: Angina; Arrhythmia; Deep Vein Thrombosis; Hypotension; Myocardial Infarction; Peripheral Arterial Disease; Peripheral Venous Disease; Phlebitis; Vasculitis Psychiatric Complaints and Symptoms: Negative for: Anxiety; Claustrophobia Medical History: Negative for: Anorexia/bulimia; Confinement Anxiety Eyes Medical History: Negative for: Cataracts; Glaucoma; Optic Neuritis Ear/Nose/Mouth/Throat Medical History: Negative for: Chronic sinus problems/congestion; Middle ear problems Hematologic/Lymphatic Medical History: Negative for: Anemia; Hemophilia; Human Immunodeficiency Virus; Lymphedema; Sickle Cell Disease Erin [name], Beyza M. ([id number]) Gastrointestinal Medical History: Negative for: Cirrhosis ; Colitis; Crohnos; Hepatitis A; Hepatitis B; Hepatitis C Endocrine Medical History: Negative for: Type I Diabetes; Type II Diabetes Genitourinary Medical History: Negative for: End Stage Renal Disease Immunological Medical History: Negative for: Lupus Erythematosus; Raynaudos; Scleroderma Integumentary (Skin) Medical History: Negative for: History of Burn; History of pressure  wounds Musculoskeletal Medical History: Negative for: Gout; Rheumatoid Arthritis; Osteoarthritis; Osteomyelitis Neurologic Medical History: Negative for: Dementia; Neuropathy; Quadriplegia; Paraplegia; Seizure Disorder Oncologic Medical History: Negative for: Received Chemotherapy; Received Radiation Immunizations Pneumococcal Vaccine: Received Pneumococcal Vaccination: No Implantable Devices Yes Family and Social History Cancer: Yes - Siblings; Diabetes: Yes - Father,Siblings; Heart Disease: Yes - Mother,Siblings; Hereditary Spherocytosis: No; Hypertension: Yes - Mother; Kidney Disease: No; Lung Disease: Yes - Siblings; Seizures: No; Stroke: No; Thyroid Problems: No; Tuberculosis: No; Never smoker; Marital Status - Single; Alcohol Use: Never; Drug Use: No History; Caffeine Use: Never; Financial Concerns: No; Food, Clothing or Shelter Needs: No; Support System Lacking: No; Transportation Concerns: No Physician Affirmation I have reviewed and agree with the above information. Electronic Signature(s) Erin [name], Yasheka M. ([id number]) Signed: 07/29/2018 4:09:36 PM By: Erin [name] Signed: 07/29/2018 4:51:26 PM By: Lenda KelpStone III, Hoyt PA-C Entered By: Lenda KelpStone III, Hoyt on 07/29/2018 09:52:07 Erin [name], Ilithyia M. ([id number]) -------------------------------------------------------------------------------- SuperBill Details Patient Name: Erin [name], Kiyoko M. Date of Service: 07/29/2018 Medical Record Number: [id number] Patient Account Number: [id number] Date of Birth/Sex: 10/10/1945 (73 y.o. F) Treating RN:  Erin Sites Primary Care Provider: PATIENT, NO Other Clinician: Referring Provider: Referral, Self Treating Provider/Extender: STONE III, HOYT Weeks in Treatment: 1 Diagnosis Coding ICD-10 Codes Code Description I87.2 Venous insufficiency (chronic) (peripheral) I89.0 Lymphedema, not elsewhere classified L97.812 Non-pressure chronic ulcer of other part of right lower leg with fat layer  exposed N18.3 Chronic kidney disease, stage 3 (moderate) I50.42 Chronic combined systolic (congestive) and diastolic (congestive) heart failure I10 Essential (primary) hypertension I48.0 Paroxysmal atrial fibrillation Facility Procedures CPT4 Code Description: [id number] (Facility Use Only) [phone] - APPLY MULTLAY COMPRS LWR RT LEG Modifier: Quantity: 1 Physician Procedures CPT4 Code Description: [id number] - WC PHYS LEVEL 4 - EST PT ICD-10 Diagnosis Description I87.2 Venous insufficiency (chronic) (peripheral) I89.0 Lymphedema, not elsewhere classified L97.812 Non-pressure chronic ulcer of other part of right lower leg  w N18.3 Chronic kidney disease, stage 3 (moderate) Modifier: ith fat layer expo Quantity: 1 sed Electronic Signature(s) Signed: 07/29/2018 4:51:26 PM By: Lenda Kelp PA-C Entered By: Lenda Kelp on 07/29/2018 09:53:50

## 2018-07-31 LAB — AEROBIC CULTURE W GRAM STAIN (SUPERFICIAL SPECIMEN): Gram Stain: NONE SEEN

## 2018-08-05 ENCOUNTER — Other Ambulatory Visit: Payer: Self-pay

## 2018-08-05 ENCOUNTER — Encounter: Payer: Medicare Other | Admitting: Physician Assistant

## 2018-08-05 DIAGNOSIS — L97812 Non-pressure chronic ulcer of other part of right lower leg with fat layer exposed: Secondary | ICD-10-CM | POA: Diagnosis not present

## 2018-08-05 NOTE — Progress Notes (Signed)
Erin [name], [name] ([id number]) Visit Report for 08/05/2018 Allergy List Details Patient Name: Erin [name], [name] Date of Service: 08/05/2018 9:30 AM Medical Record Number: [id number] Patient Account Number: [id number] Date of Birth/Sex: 04/12/1945 (73 y.o. F) Treating RN: Erin [name] Primary Care Basilio Meadow: PATIENT, NO Other Clinician: Referring Erin [name]: Referral, Self Treating Erin [name]/Extender: Erin [name] Erin [name]: 2 Allergies Active Allergies No Known Drug Allergies Allergy Notes Electronic Signature(s) Signed: 08/05/2018 4:33:47 PM By: Erin [name] Entered By: Erin [name] on 08/05/2018 10:16:40 Erin [name], [name] ([id number]) -------------------------------------------------------------------------------- Arrival Information Details Patient Name: Erin [name], [name] Date of Service: 08/05/2018 9:30 AM Medical Record Number: [id number] Patient Account Number: [id number] Date of Birth/Sex: 12/13/1945 (73 y.o. F) Treating RN: Erin [name] Primary Care Montrail Mehrer: PATIENT, NO Other Clinician: Referring Presli Fanguy: Referral, Self Treating Hulbert Branscome/Extender: Erin DibblesSTONE III, [name] Erin [name]: 2 Visit Information Patient Arrived: Walker Arrival Time: 09:48 Accompanied By: daughter Transfer Assistance: None Patient Identification Verified: Yes Secondary Verification Process Completed: Yes History Since Last Visit Added or deleted any medications: No Any new allergies or adverse reactions: No Had a fall or experienced change in activities of daily living that may affect risk of falls: No Signs or symptoms of abuse/neglect since last visito No Hospitalized since last visit: No Has Dressing in Place as Prescribed: Yes Has Compression in Place as Prescribed: Yes Pain Present Now: No Electronic Signature(s) Signed: 08/05/2018 4:25:02 PM By: Erin [name] Entered By: Erin [name] on 08/05/2018 09:48:32 [name], Erin PouchJULIA M.  ([id number]) -------------------------------------------------------------------------------- Compression Therapy Details Patient Name: Erin [name], [name] Date of Service: 08/05/2018 9:30 AM Medical Record Number: [id number] Patient Account Number: [id number] Date of Birth/Sex: 09/04/1945 (73 y.o. F) Treating RN: Erin [name] Primary Care Markiah Janeway: PATIENT, NO Other Clinician: Referring Particia Strahm: Referral, Self Treating Erin [name]/Extender: Erin [name] Erin [name]: 2 Compression Therapy Performed for Wound Assessment: Wound #1 Right,Lateral Lower Leg Performed By: Clinician Erin Sitesorthy, Joanna, RN Compression Type: Three Layer Pre [name] ABI: 1 Post Procedure Diagnosis Same as Pre-procedure Electronic Signature(s) Signed: 08/05/2018 4:33:47 PM By: Erin [name] Entered By: Erin [name] on 08/05/2018 10:18:59 Erin [name], [name] ([id number]) -------------------------------------------------------------------------------- Encounter Discharge Information Details Patient Name: Erin [name], [name] Date of Service: 08/05/2018 9:30 AM Medical Record Number: [id number] Patient Account Number: [id number] Date of Birth/Sex: 05/23/1945 (73 y.o. F) Treating RN: Erin [name] Primary Care Tarissa Kerin: PATIENT, NO Other Clinician: Referring Jabarie Pop: Referral, Self Treating Karisa Nesser/Extender: Erin DibblesSTONE III, [name] Erin [name]: 2 Encounter Discharge Information Items Discharge Condition: Stable Ambulatory Status: Walker Discharge Destination: Home Transportation: Private Auto Accompanied By: daughter Schedule Follow-up Appointment: Yes Clinical Summary of Care: Electronic Signature(s) Signed: 08/05/2018 4:33:47 PM By: Erin [name] Entered By: Erin [name] on 08/05/2018 10:19:37 Erin [name], [name] ([id number]) -------------------------------------------------------------------------------- Lower Extremity Assessment Details Patient Name: Erin [name], [name] Date of Service: 08/05/2018  9:30 AM Medical Record Number: [id number] Patient Account Number: [id number] Date of Birth/Sex: 01/19/1945 (73 y.o. F) Treating RN: Erin [name] Primary Care Lei Dower: PATIENT, NO Other Clinician: Referring Kamarie Palma: Referral, Self Treating Dejah Droessler/Extender: Erin [name] Erin [name]: 2 Vascular Assessment Pulses: Dorsalis Pedis Palpable: [Right:Yes] Posterior Tibial Palpable: [Right:Yes] Electronic Signature(s) Signed: 08/05/2018 4:25:02 PM By: Erin [name] Entered By: Erin [name] on 08/05/2018 10:01:55 Erin [name], [name] ([id number]) -------------------------------------------------------------------------------- Multi Wound Chart Details Patient Name: Erin [name], Erin [name] M. Date of Service: 08/05/2018 9:30 AM Medical Record Number: [id number] Patient Account Number: [id number] Date of Birth/Sex: 10/19/1945 (73 y.o. F) Treating RN: Erin [name] Primary Care Yana Schorr: PATIENT, NO Other Clinician: Referring Noura Purpura: Referral, Self Treating Roda Lauture/Extender: Erin [name]  Erin [name]: 2 Vital Signs Height(in): 60 Pulse(bpm): 84 Weight(lbs): 213 Blood Pressure(mmHg): 123/56 Body Mass Index(BMI): 42 Temperature(F): 98.5 Respiratory Rate 18 (breaths/min): Photos: [N/A:N/A] Wound Location: Right Lower Leg - Lateral N/A N/A Wounding Event: Blister N/A N/A Primary Etiology: Venous Leg Ulcer N/A N/A Comorbid History: Congestive Heart Failure, N/A N/A Coronary Artery Disease, Hypertension Date Acquired: 07/01/2018 N/A N/A Erin of [name]: 2 N/A N/A Wound Status: Open N/A N/A Measurements L x W x D 0.9x3x0.1 N/A N/A (cm) Area (cm) : 2.121 N/A N/A Volume (cm) : 0.212 N/A N/A % Reduction in Area: 97.70% N/A N/A % Reduction in Volume: 97.70% N/A N/A Classification: Partial Thickness N/A N/A Exudate Amount: Medium N/A N/A Exudate Type: Serous N/A N/A Exudate Color: amber N/A N/A Wound Margin: Flat and Intact N/A N/A Granulation Amount: Medium  (34-66%) N/A N/A Granulation Quality: Pink N/A N/A Necrotic Amount: Small (1-33%) N/A N/A Necrotic Tissue: Eschar, Adherent Slough N/A N/A Exposed Structures: Fat Layer (Subcutaneous N/A N/A Tissue) Exposed: Yes Fascia: No Tendon: No Muscle: No ADRAIN, KELL ([id number]) Joint: No Bone: No Epithelialization: Large (67-100%) N/A N/A [name] Notes Electronic Signature(s) Signed: 08/05/2018 4:33:47 PM By: Erin Sites Entered By: Erin Sites on 08/05/2018 10:12:34 Erin Bowens ([id number]) -------------------------------------------------------------------------------- Multi-Disciplinary Care Plan Details Patient Name: Erin Bowens. Date of Service: 08/05/2018 9:30 AM Medical Record Number: [id number] Patient Account Number: [id number] Date of Birth/Sex: [birth year] (73 y.o. F) Treating RN: Erin Sites Primary Care Amberley Hamler: PATIENT, NO Other Clinician: Referring Sapphire Tygart: Referral, Self Treating Blayn Whetsell/Extender: Erin [name] Erin [name]: 2 Active Inactive Abuse / Safety / Falls / Self Care Management Nursing Diagnoses: Potential for falls Goals: Patient will remain injury free related to falls Date Initiated: 07/22/2018 Target Resolution Date: 11/04/2018 Goal Status: Active Interventions: Assess fall risk on admission and as needed Notes: Orientation to the Wound Care Program Nursing Diagnoses: Knowledge deficit related to the wound healing center program Goals: Patient/caregiver will verbalize understanding of the Wound Healing Center Program Date Initiated: 07/22/2018 Target Resolution Date: 11/04/2018 Goal Status: Active Interventions: Provide education on orientation to the wound center Notes: Venous Leg Ulcer Nursing Diagnoses: Potential for venous Insuffiency (use before diagnosis confirmed) Goals: Patient will maintain optimal edema control Date Initiated: 07/22/2018 Target Resolution Date: 11/04/2018 Goal Status:  Active Interventions: Compression as ordered SHAHAD, SOLBERG ([id number]) Notes: Wound/Skin Impairment Nursing Diagnoses: Impaired tissue integrity Goals: Ulcer/skin breakdown will heal within 14 Erin Date Initiated: 07/22/2018 Target Resolution Date: 11/04/2018 Goal Status: Active Interventions: Assess patient/caregiver ability to obtain necessary supplies Assess patient/caregiver ability to perform ulcer/skin care regimen upon admission and as needed Assess ulceration(s) every visit Notes: Electronic Signature(s) Signed: 08/05/2018 4:33:47 PM By: Erin Sites Entered By: Erin Sites on 08/05/2018 10:12:25 Erin Bowens ([id number]) -------------------------------------------------------------------------------- Pain Assessment Details Patient Name: Erin Bowens. Date of Service: 08/05/2018 9:30 AM Medical Record Number: [id number] Patient Account Number: [id number] Date of Birth/Sex: Jul 23, 1945 (73 y.o. F) Treating RN: Erin Norris Primary Care Ezma Rehm: PATIENT, NO Other Clinician: Referring Anisah Kuck: Referral, Self Treating Jessamine Barcia/Extender: Erin [name] Erin [name]: 2 Active Problems Location of Pain Severity and Description of Pain Patient Has Paino No Site Locations Pain Management and Medication Current Pain Management: Electronic Signature(s) Signed: 08/05/2018 4:25:02 PM By: Erin Norris Entered By: Erin Norris on 08/05/2018 09:49:07 Erin Bowens ([id number]) -------------------------------------------------------------------------------- Patient/Caregiver Education Details Patient Name: Erin Bowens. Date of Service: 08/05/2018 9:30 AM Medical Record Number: [id number] Patient Account Number: [id number] Date of Birth/Gender: 08/17/1945 (73 y.o. F)  Treating RN: Montey Hora Primary Care Physician: PATIENT, NO Other Clinician: Referring Physician: Referral, Self Treating Physician/Extender: Melburn Hake, [name] Erin in  [name]: 2 Education Assessment Education Provided To: Patient and Caregiver Education Topics Provided Venous: Handouts: Other: leg elevation Methods: Explain/Verbal Responses: State content correctly Electronic Signature(s) Signed: 08/05/2018 4:33:47 PM By: Montey Hora Entered By: Montey Hora on 08/05/2018 10:18:39 Stigall, Placido Sou ([id number]) -------------------------------------------------------------------------------- Wound Assessment Details Patient Name: [name]. Date of Service: 08/05/2018 9:30 AM Medical Record Number: [id number] Patient Account Number: [id number] Date of Birth/Sex: [birth year] (73 y.o. F) Treating RN: Harold Barban Primary Care Armilda Vanderlinden: PATIENT, NO Other Clinician: Referring Shaun Runyon: Referral, Self Treating Parminder Cupples/Extender: Erin [name] Erin [name]: 2 Wound Status Wound Number: 1 Primary Venous Leg Ulcer Etiology: Wound Location: Right Lower Leg - Lateral Wound Status: Open Wounding Event: Blister Comorbid Congestive Heart Failure, Coronary Artery Date Acquired: 07/01/2018 History: Disease, Hypertension Erin Of [name]: 2 Clustered Wound: No Photos Wound Measurements Length: (cm) 0.9 Width: (cm) 3 Depth: (cm) 0.1 Area: (cm) 2.121 Volume: (cm) 0.212 % Reduction in Area: 97.7% % Reduction in Volume: 97.7% Epithelialization: Large (67-100%) Tunneling: No Undermining: No Wound Description Classification: Partial Thickness Foul Odor Af Wound Margin: Flat and Intact Slough/Fibri Exudate Amount: Medium Exudate Type: Serous Exudate Color: amber ter Cleansing: No no Yes Wound Bed Granulation Amount: Medium (34-66%) Exposed Structure Granulation Quality: Pink Fascia Exposed: No Necrotic Amount: Small (1-33%) Fat Layer (Subcutaneous Tissue) Exposed: Yes Necrotic Quality: Eschar, Adherent Slough Tendon Exposed: No Muscle Exposed: No Joint Exposed: No Bone Exposed: No [name] Notes TRACYE, SZUCH. ([id number]) Wound #1 (Right, Lateral Lower Leg) Notes prisma, abd, 3 layer wrap with unna to anchor Electronic Signature(s) Signed: 08/05/2018 4:25:02 PM By: Harold Barban Entered By: Harold Barban on 08/05/2018 10:01:30 [name] ([id number]) -------------------------------------------------------------------------------- Vitals Details Patient Name: [name]. Date of Service: 08/05/2018 9:30 AM Medical Record Number: [id number] Patient Account Number: [id number] Date of Birth/Sex: July 15, 1945 (73 y.o. F) Treating RN: Harold Barban Primary Care Alohilani Levenhagen: PATIENT, NO Other Clinician: Referring Eleanna Theilen: Referral, Self Treating Shadia Larose/Extender: Erin [name] Erin [name]: 2 Vital Signs Time Taken: 09:49 Temperature (F): 98.5 Height (in): 60 Pulse (bpm): 84 Weight (lbs): 213 Respiratory Rate (breaths/min): 18 Body Mass Index (BMI): 41.6 Blood Pressure (mmHg): 123/56 Reference Range: 80 - 120 mg / dl Electronic Signature(s) Signed: 08/05/2018 4:25:02 PM By: Harold Barban Entered By: Harold Barban on 08/05/2018 09:51:59

## 2018-08-05 NOTE — Progress Notes (Addendum)
Erin, [name] ([id number]) Visit Report for 08/05/2018 Chief Complaint Document Details Patient Name: Erin, [name]. Date of Service: 08/05/2018 9:30 AM Medical Record Number: [id number] Patient Account Number: [id number] Date of Birth/Sex: [birth year] (73 y.o. F) Treating RN: Curtis Sites Primary Care Provider: PATIENT, NO Other Clinician: Referring Provider: Referral, Self Treating Provider/Extender: Linwood Dibbles, HOYT Weeks in Treatment: 2 Information Obtained from: Patient Chief Complaint Right LE Ulcer Electronic Signature(s) Signed: 08/05/2018 10:10:43 AM By: Lenda Kelp PA-C Entered By: Lenda Kelp on 08/05/2018 10:10:42 Erin [name] ([id number]) -------------------------------------------------------------------------------- HPI Details Patient Name: Erin [name]. Date of Service: 08/05/2018 9:30 AM Medical Record Number: [id number] Patient Account Number: [id number] Date of Birth/Sex: 1945-10-10 (73 y.o. F) Treating RN: Curtis Sites Primary Care Provider: PATIENT, NO Other Clinician: Referring Provider: Referral, Self Treating Provider/Extender: Linwood Dibbles, HOYT Weeks in Treatment: 2 History of Present Illness HPI Description: 07/22/18 patient presents today for initial evaluation regarding issues that she is having with wounds on the right lateral lower leg. The good news is that these actually appear to be showing signs of already feeling which is good news. There still a lot of maceration and drainage as well as edema noted at this point. This is something that from the edema standpoint has been going on for some time the patient obviously as lymphedema among issues with venous stasis as well. With that being said I do believe at this point that she's having some discomfort at the site fortunately there's no evidence of active infection at this time which is good news. The patient has been having issues as well it sounds like with something completely  separate in regard to pain she's unable to raise her legs and elevate them due to the fact that whenever she does she has a burning pain that goes into her right hip laterally and then runs down into her foot in times as well. This actually appears to be something emanating from her back which I discussed with her today as well I think that's completely separate from the chronic venous stasis and lymphedema which we are going to be managing at this point. No fevers, chills, nausea, or vomiting noted at this time. 07/29/18 on evaluation today patient appears to be doing better in regard to the overall wound size which is good news. With that being said she is still having a significant amount of pain some of this is directly related to be open wound and even to the fact that there might be a little bit of infection at this point. With that being said things are not looking too poorly at this point which is good news. I did obtain a culture today to test this further. With that being said with regard to the orthopedic referral this has been sent they have not heard from the office as of yet. Advised that they do not hear anything by Monday that they give the office a call. 08/05/2018 on evaluation today patient appears to be doing better with regard to her lower extremity ulcer. There is just a very small area that still has any opening remaining at this time which is excellent news. She has been tolerating the compression wrap without complication. I do believe this has been extremely beneficial for her which is excellent news. Again we have been utilizing a 3 layer compression. She was also placed on Cipro most recently I had to change her from doxycycline to Cipro due to the culture findings. I do not  see any evidence of more significant infection compared to last week. Electronic Signature(s) Signed: 08/05/2018 5:16:20 PM By: Lenda KelpStone III, Hoyt PA-C Entered By: Lenda KelpStone III, Hoyt on 08/05/2018  17:16:20 Erin [name], [name] ([id number]) -------------------------------------------------------------------------------- Physical Exam Details Patient Name: Erin [name], [name] Date of Service: 08/05/2018 9:30 AM Medical Record Number: [id number] Patient Account Number: [id number] Date of Birth/Sex: 11/04/1945 (73 y.o. F) Treating RN: Curtis Sitesorthy, Joanna Primary Care Provider: PATIENT, NO Other Clinician: Referring Provider: Referral, Self Treating Provider/Extender: STONE III, HOYT Weeks in Treatment: 2 Constitutional Well-nourished and well-hydrated in no acute distress. Respiratory normal breathing without difficulty. clear to auscultation bilaterally. Cardiovascular regular rate and rhythm with normal S1, S2. Psychiatric this patient is able to make decisions and demonstrates good insight into disease process. Alert and Oriented x 3. pleasant and cooperative. Notes Upon inspection patient's wound bed actually showed signs of good granulation at this time fortunately there does not appear to be any evidence of active infection which is good news. Her swelling seems to be much better controlled with a compression wrap and I think this is definitely helping her to heal. Electronic Signature(s) Signed: 08/05/2018 5:16:59 PM By: Lenda KelpStone III, Hoyt PA-C Entered By: Lenda KelpStone III, Hoyt on 08/05/2018 17:16:59 Erin [name], Erin PouchJULIA M. ([id number]) -------------------------------------------------------------------------------- Physician Orders Details Patient Name: Erin [name], [name] Date of Service: 08/05/2018 9:30 AM Medical Record Number: [id number] Patient Account Number: [id number] Date of Birth/Sex: 07/09/1945 (73 y.o. F) Treating RN: Curtis Sitesorthy, Joanna Primary Care Provider: PATIENT, NO Other Clinician: Referring Provider: Referral, Self Treating Provider/Extender: STONE III, HOYT Weeks in Treatment: 2 Verbal / Phone Orders: No Diagnosis Coding ICD-10 Coding Code Description I87.2 Venous insufficiency  (chronic) (peripheral) I89.0 Lymphedema, not elsewhere classified L97.812 Non-pressure chronic ulcer of other part of right lower leg with fat layer exposed N18.3 Chronic kidney disease, stage 3 (moderate) I50.42 Chronic combined systolic (congestive) and diastolic (congestive) heart failure I10 Essential (primary) hypertension I48.0 Paroxysmal atrial fibrillation Wound Cleansing Wound #1 Right,Lateral Lower Leg o Clean wound with Normal Saline. o May shower with protection. - Please do not get your wrap wet Primary Wound Dressing Wound #1 Right,Lateral Lower Leg o Silver Collagen Secondary Dressing Wound #1 Right,Lateral Lower Leg o ABD pad Dressing Change Frequency Wound #1 Right,Lateral Lower Leg o Change dressing every week Follow-up Appointments o Return Appointment in 1 week. Edema Control Wound #1 Right,Lateral Lower Leg o 3 Layer Compression System - Right Lower Extremity Electronic Signature(s) Signed: 08/05/2018 4:33:47 PM By: Curtis Sitesorthy, Joanna Signed: 08/07/2018 6:04:32 PM By: Lenda KelpStone III, Hoyt PA-C Entered By: Curtis Sitesorthy, Joanna on 08/05/2018 10:18:03 Erin [name], [name] ([id number]) Erin [name], [name] ([id number]) -------------------------------------------------------------------------------- Problem List Details Patient Name: Erin [name], [name] Date of Service: 08/05/2018 9:30 AM Medical Record Number: [id number] Patient Account Number: [id number] Date of Birth/Sex: 09/12/1945 (73 y.o. F) Treating RN: Curtis Sitesorthy, Joanna Primary Care Provider: PATIENT, NO Other Clinician: Referring Provider: Referral, Self Treating Provider/Extender: Linwood DibblesSTONE III, HOYT Weeks in Treatment: 2 Active Problems ICD-10 Evaluated Encounter Code Description Active Date Today Diagnosis I87.2 Venous insufficiency (chronic) (peripheral) 07/22/2018 No Yes I89.0 Lymphedema, not elsewhere classified 07/24/2018 No Yes L97.812 Non-pressure chronic ulcer of other part of right lower leg 07/22/2018 No  Yes with fat layer exposed N18.3 Chronic kidney disease, stage 3 (moderate) 07/22/2018 No Yes I50.42 Chronic combined systolic (congestive) and diastolic 07/22/2018 No Yes (congestive) heart failure I10 Essential (primary) hypertension 07/22/2018 No Yes I48.0 Paroxysmal atrial fibrillation 07/22/2018 No Yes Inactive Problems Resolved Problems Electronic Signature(s) Signed: 08/05/2018 10:10:27 AM By: Lenda KelpStone III, Hoyt PA-C Entered  By: Lenda KelpStone III, Hoyt on 08/05/2018 10:10:27 Erin [name], Kazia M. ([id number]) -------------------------------------------------------------------------------- Progress Note Details Patient Name: Erin [name], Barbi M. Date of Service: 08/05/2018 9:30 AM Medical Record Number: [id number] Patient Account Number: [id number] Date of Birth/Sex: 03/31/1945 (73 y.o. F) Treating RN: Curtis Sitesorthy, Joanna Primary Care Provider: PATIENT, NO Other Clinician: Referring Provider: Referral, Self Treating Provider/Extender: Linwood DibblesSTONE III, HOYT Weeks in Treatment: 2 Subjective Chief Complaint Information obtained from Patient Right LE Ulcer History of Present Illness (HPI) 07/22/18 patient presents today for initial evaluation regarding issues that she is having with wounds on the right lateral lower leg. The good news is that these actually appear to be showing signs of already feeling which is good news. There still a lot of maceration and drainage as well as edema noted at this point. This is something that from the edema standpoint has been going on for some time the patient obviously as lymphedema among issues with venous stasis as well. With that being said I do believe at this point that she's having some discomfort at the site fortunately there's no evidence of active infection at this time which is good news. The patient has been having issues as well it sounds like with something completely separate in regard to pain she's unable to raise her legs and elevate them due to the fact that  whenever she does she has a burning pain that goes into her right hip laterally and then runs down into her foot in times as well. This actually appears to be something emanating from her back which I discussed with her today as well I think that's completely separate from the chronic venous stasis and lymphedema which we are going to be managing at this point. No fevers, chills, nausea, or vomiting noted at this time. 07/29/18 on evaluation today patient appears to be doing better in regard to the overall wound size which is good news. With that being said she is still having a significant amount of pain some of this is directly related to be open wound and even to the fact that there might be a little bit of infection at this point. With that being said things are not looking too poorly at this point which is good news. I did obtain a culture today to test this further. With that being said with regard to the orthopedic referral this has been sent they have not heard from the office as of yet. Advised that they do not hear anything by Monday that they give the office a call. 08/05/2018 on evaluation today patient appears to be doing better with regard to her lower extremity ulcer. There is just a very small area that still has any opening remaining at this time which is excellent news. She has been tolerating the compression wrap without complication. I do believe this has been extremely beneficial for her which is excellent news. Again we have been utilizing a 3 layer compression. She was also placed on Cipro most recently I had to change her from doxycycline to Cipro due to the culture findings. I do not see any evidence of more significant infection compared to last week. Patient History Information obtained from Patient. Allergies No Known Drug Allergies Family History Cancer - Siblings, Diabetes - Father,Siblings, Heart Disease - Mother,Siblings, Hypertension - Mother, Lung Disease  - Siblings, No family history of Hereditary Spherocytosis, Kidney Disease, Seizures, Stroke, Thyroid Problems, Tuberculosis. Social History Never smoker, Marital Status - Single, Alcohol Use - Never, Drug Use - No History, Caffeine  Use - Never. Erin [name], Waldine M. ([id number]) Medical History Eyes Denies history of Cataracts, Glaucoma, Optic Neuritis Ear/Nose/Mouth/Throat Denies history of Chronic sinus problems/congestion, Middle ear problems Hematologic/Lymphatic Denies history of Anemia, Hemophilia, Human Immunodeficiency Virus, Lymphedema, Sickle Cell Disease Respiratory Denies history of Aspiration, Asthma, Chronic Obstructive Pulmonary Disease (COPD), Pneumothorax, Sleep Apnea, Tuberculosis Cardiovascular Patient has history of Congestive Heart Failure, Coronary Artery Disease, Hypertension Denies history of Angina, Arrhythmia, Deep Vein Thrombosis, Hypotension, Myocardial Infarction, Peripheral Arterial Disease, Peripheral Venous Disease, Phlebitis, Vasculitis Gastrointestinal Denies history of Cirrhosis , Colitis, Crohn s, Hepatitis A, Hepatitis B, Hepatitis C Endocrine Denies history of Type I Diabetes, Type II Diabetes Genitourinary Denies history of End Stage Renal Disease Immunological Denies history of Lupus Erythematosus, Raynaud s, Scleroderma Integumentary (Skin) Denies history of History of Burn, History of pressure wounds Musculoskeletal Denies history of Gout, Rheumatoid Arthritis, Osteoarthritis, Osteomyelitis Neurologic Denies history of Dementia, Neuropathy, Quadriplegia, Paraplegia, Seizure Disorder Oncologic Denies history of Received Chemotherapy, Received Radiation Psychiatric Denies history of Anorexia/bulimia, Confinement Anxiety Review of Systems (ROS) Constitutional Symptoms (General Health) Denies complaints or symptoms of Fatigue, Fever, Chills, Marked Weight Change. Respiratory Denies complaints or symptoms of Chronic or frequent coughs,  Shortness of Breath. Cardiovascular Complains or has symptoms of LE edema. Denies complaints or symptoms of Chest pain. Psychiatric Denies complaints or symptoms of Anxiety, Claustrophobia. Objective Constitutional Well-nourished and well-hydrated in no acute distress. Vitals Time Taken: 9:49 AM, Height: 60 in, Weight: 213 lbs, BMI: 41.6, Temperature: 98.5 F, Pulse: 84 bpm, Respiratory Rate: 18 breaths/min, Blood Pressure: 123/56 mmHg. Erin [name], Niyah M. ([id number]) Respiratory normal breathing without difficulty. clear to auscultation bilaterally. Cardiovascular regular rate and rhythm with normal S1, S2. Psychiatric this patient is able to make decisions and demonstrates good insight into disease process. Alert and Oriented x 3. pleasant and cooperative. General Notes: Upon inspection patient's wound bed actually showed signs of good granulation at this time fortunately there does not appear to be any evidence of active infection which is good news. Her swelling seems to be much better controlled with a compression wrap and I think this is definitely helping her to heal. Integumentary (Hair, Skin) Wound #1 status is Open. Original cause of wound was Blister. The wound is located on the Right,Lateral Lower Leg. The wound measures 0.9cm length x 3cm width x 0.1cm depth; 2.121cm^2 area and 0.212cm^3 volume. There is Fat Layer (Subcutaneous Tissue) Exposed exposed. There is no tunneling or undermining noted. There is a medium amount of serous drainage noted. The wound margin is flat and intact. There is medium (34-66%) pink granulation within the wound bed. There is a small (1-33%) amount of necrotic tissue within the wound bed including Eschar and Adherent Slough. Assessment Active Problems ICD-10 Venous insufficiency (chronic) (peripheral) Lymphedema, not elsewhere classified Non-pressure chronic ulcer of other part of right lower leg with fat layer exposed Chronic kidney disease,  stage 3 (moderate) Chronic combined systolic (congestive) and diastolic (congestive) heart failure Essential (primary) hypertension Paroxysmal atrial fibrillation Procedures Wound #1 Pre-procedure diagnosis of Wound #1 is a Venous Leg Ulcer located on the Right,Lateral Lower Leg . There was a Three Layer Compression Therapy Procedure with a pre-treatment ABI of 1 by Curtis Sitesorthy, Joanna, RN. Post procedure Diagnosis Wound #1: Same as Pre-Procedure Plan Erin [name], Jennelle M. ([id number]) Wound Cleansing: Wound #1 Right,Lateral Lower Leg: Clean wound with Normal Saline. May shower with protection. - Please do not get your wrap wet Primary Wound Dressing: Wound #1 Right,Lateral Lower Leg: Silver Collagen Secondary Dressing: Wound #1  Right,Lateral Lower Leg: ABD pad Dressing Change Frequency: Wound #1 Right,Lateral Lower Leg: Change dressing every week Follow-up Appointments: Return Appointment in 1 week. Edema Control: Wound #1 Right,Lateral Lower Leg: 3 Layer Compression System - Right Lower Extremity 1. Numbness suggest that we continue with the current wound care measures including the silver collagen which seems to be beneficial. I think that this will help things to close very rapidly as far as additional epithelialization and skin growth. 2. On that I suggest also continuation of 3 layer compression wrap which I believe has been beneficial up to this point. 3. I do believe the patient needs to continue to try to elevate her legs as much as possible not limiting her as far as mobility is concerned I think she is okay to walk around as well. We will see patient back for reevaluation in 1 week here in the clinic. If anything worsens or changes patient will contact our office for additional recommendations. Electronic Signature(s) Signed: 08/05/2018 5:17:53 PM By: Worthy Keeler PA-C Entered By: Worthy Keeler on 08/05/2018 17:17:53 Hallstrom, Placido Sou  ([id number]) -------------------------------------------------------------------------------- ROS/PFSH Details Patient Name: [name]. Date of Service: 08/05/2018 9:30 AM Medical Record Number: [id number] Patient Account Number: [id number] Date of Birth/Sex: 1945/02/15 (73 y.o. F) Treating RN: Montey Hora Primary Care Provider: PATIENT, NO Other Clinician: Referring Provider: Referral, Self Treating Provider/Extender: STONE III, HOYT Weeks in Treatment: 2 Information Obtained From Patient Constitutional Symptoms (General Health) Complaints and Symptoms: Negative for: Fatigue; Fever; Chills; Marked Weight Change Respiratory Complaints and Symptoms: Negative for: Chronic or frequent coughs; Shortness of Breath Medical History: Negative for: Aspiration; Asthma; Chronic Obstructive Pulmonary Disease (COPD); Pneumothorax; Sleep Apnea; Tuberculosis Cardiovascular Complaints and Symptoms: Positive for: LE edema Negative for: Chest pain Medical History: Positive for: Congestive Heart Failure; Coronary Artery Disease; Hypertension Negative for: Angina; Arrhythmia; Deep Vein Thrombosis; Hypotension; Myocardial Infarction; Peripheral Arterial Disease; Peripheral Venous Disease; Phlebitis; Vasculitis Psychiatric Complaints and Symptoms: Negative for: Anxiety; Claustrophobia Medical History: Negative for: Anorexia/bulimia; Confinement Anxiety Eyes Medical History: Negative for: Cataracts; Glaucoma; Optic Neuritis Ear/Nose/Mouth/Throat Medical History: Negative for: Chronic sinus problems/congestion; Middle ear problems Hematologic/Lymphatic Medical History: Negative for: Anemia; Hemophilia; Human Immunodeficiency Virus; Lymphedema; Sickle Cell Disease ALEECE, LOYD ([id number]) Gastrointestinal Medical History: Negative for: Cirrhosis ; Colitis; Crohnos; Hepatitis A; Hepatitis B; Hepatitis C Endocrine Medical History: Negative for: Type I Diabetes; Type II  Diabetes Genitourinary Medical History: Negative for: End Stage Renal Disease Immunological Medical History: Negative for: Lupus Erythematosus; Raynaudos; Scleroderma Integumentary (Skin) Medical History: Negative for: History of Burn; History of pressure wounds Musculoskeletal Medical History: Negative for: Gout; Rheumatoid Arthritis; Osteoarthritis; Osteomyelitis Neurologic Medical History: Negative for: Dementia; Neuropathy; Quadriplegia; Paraplegia; Seizure Disorder Oncologic Medical History: Negative for: Received Chemotherapy; Received Radiation Immunizations Pneumococcal Vaccine: Received Pneumococcal Vaccination: No Implantable Devices Yes Family and Social History Cancer: Yes - Siblings; Diabetes: Yes - Father,Siblings; Heart Disease: Yes - Mother,Siblings; Hereditary Spherocytosis: No; Hypertension: Yes - Mother; Kidney Disease: No; Lung Disease: Yes - Siblings; Seizures: No; Stroke: No; Thyroid Problems: No; Tuberculosis: No; Never smoker; Marital Status - Single; Alcohol Use: Never; Drug Use: No History; Caffeine Use: Never; Financial Concerns: No; Food, Clothing or Shelter Needs: No; Support System Lacking: No; Transportation Concerns: No Physician Affirmation I have reviewed and agree with the above information. Electronic Signature(s) CLARISSE, RODRIGES ([id number]) Signed: 08/07/2018 6:04:32 PM By: Worthy Keeler PA-C Signed: 08/08/2018 3:29:41 PM By: Montey Hora Entered By: Worthy Keeler on 08/05/2018 17:16:42 DELLIE, PIASECKI. ([id number]) --------------------------------------------------------------------------------  SuperBill Details Patient Name: AINSLEIGH, DROHAN. Date of Service: 08/05/2018 Medical Record Number: [id number] Patient Account Number: [id number] Date of Birth/Sex: 1945/04/08 (73 y.o. F) Treating RN: Curtis Sites Primary Care Provider: PATIENT, NO Other Clinician: Referring Provider: Referral, Self Treating Provider/Extender: STONE  III, HOYT Weeks in Treatment: 2 Diagnosis Coding ICD-10 Codes Code Description I87.2 Venous insufficiency (chronic) (peripheral) I89.0 Lymphedema, not elsewhere classified L97.812 Non-pressure chronic ulcer of other part of right lower leg with fat layer exposed N18.3 Chronic kidney disease, stage 3 (moderate) I50.42 Chronic combined systolic (congestive) and diastolic (congestive) heart failure I10 Essential (primary) hypertension I48.0 Paroxysmal atrial fibrillation Facility Procedures CPT4 Code Description: [id number] (Facility Use Only) [phone] - APPLY MULTLAY COMPRS LWR RT LEG Modifier: Quantity: 1 Physician Procedures CPT4 Code Description: [id number] - WC PHYS LEVEL 4 - EST PT ICD-10 Diagnosis Description I87.2 Venous insufficiency (chronic) (peripheral) I89.0 Lymphedema, not elsewhere classified L97.812 Non-pressure chronic ulcer of other part of right lower leg  w Modifier: ith fat layer expo Quantity: 1 sed Electronic Signature(s) Signed: 08/05/2018 5:18:10 PM By: Lenda Kelp PA-C Previous Signature: 08/05/2018 4:33:47 PM Version By: Curtis Sites Entered By: Lenda Kelp on 08/05/2018 17:18:10

## 2018-08-12 ENCOUNTER — Other Ambulatory Visit: Payer: Self-pay

## 2018-08-12 ENCOUNTER — Encounter: Payer: Medicare Other | Attending: Physician Assistant | Admitting: Physician Assistant

## 2018-08-12 DIAGNOSIS — I130 Hypertensive heart and chronic kidney disease with heart failure and stage 1 through stage 4 chronic kidney disease, or unspecified chronic kidney disease: Secondary | ICD-10-CM | POA: Insufficient documentation

## 2018-08-12 DIAGNOSIS — I480 Paroxysmal atrial fibrillation: Secondary | ICD-10-CM | POA: Diagnosis not present

## 2018-08-12 DIAGNOSIS — I5042 Chronic combined systolic (congestive) and diastolic (congestive) heart failure: Secondary | ICD-10-CM | POA: Diagnosis not present

## 2018-08-12 DIAGNOSIS — I890 Lymphedema, not elsewhere classified: Secondary | ICD-10-CM | POA: Diagnosis not present

## 2018-08-12 DIAGNOSIS — I872 Venous insufficiency (chronic) (peripheral): Secondary | ICD-10-CM | POA: Diagnosis not present

## 2018-08-12 DIAGNOSIS — L97821 Non-pressure chronic ulcer of other part of left lower leg limited to breakdown of skin: Secondary | ICD-10-CM | POA: Diagnosis not present

## 2018-08-12 DIAGNOSIS — L97812 Non-pressure chronic ulcer of other part of right lower leg with fat layer exposed: Secondary | ICD-10-CM | POA: Diagnosis not present

## 2018-08-12 DIAGNOSIS — N183 Chronic kidney disease, stage 3 (moderate): Secondary | ICD-10-CM | POA: Diagnosis not present

## 2018-08-12 DIAGNOSIS — I2510 Atherosclerotic heart disease of native coronary artery without angina pectoris: Secondary | ICD-10-CM | POA: Insufficient documentation

## 2018-08-12 NOTE — Progress Notes (Signed)
Erin [name], [name] ([id number]) Visit Report for 08/12/2018 Arrival Information Details Patient Name: Erin [name], [name] Date of Service: 08/12/2018 10:30 AM Medical Record Number: [id number] Patient Account Number: [id number] Date of Birth/Sex: 10/27/1945 (73 y.o. F) Treating RN: Rodell PernaScott, Dajea Primary Care Neya Creegan: PATIENT, NO Other Clinician: Referring Markees Carns: Referral, Self Treating Roshanda Balazs/Extender: STONE III, HOYT Weeks in Treatment: 3 Visit Information History Since Last Visit Added or deleted any medications: No Patient Arrived: Walker Any new allergies or adverse reactions: No Arrival Time: 10:38 Had a fall or experienced change in No Accompanied By: self activities of daily living that may affect Transfer Assistance: None risk of falls: Signs or symptoms of abuse/neglect since last visito No Hospitalized since last visit: No Has Dressing in Place as Prescribed: Yes Pain Present Now: No Electronic Signature(s) Signed: 08/12/2018 11:48:59 AM By: Rodell PernaScott, Dajea Entered By: Rodell PernaScott, Dajea on 08/12/2018 10:39:05 Erin [name], [name] ([id number]) -------------------------------------------------------------------------------- Encounter Discharge Information Details Patient Name: Erin [name], [name] Date of Service: 08/12/2018 10:30 AM Medical Record Number: [id number] Patient Account Number: [id number] Date of Birth/Sex: 07/06/1945 (73 y.o. F) Treating RN: Huel CoventryWoody, Kim Primary Care Mikaylee Arseneau: PATIENT, NO Other Clinician: Referring Adelayde Minney: Referral, Self Treating Jacqulin Brandenburger/Extender: Linwood DibblesSTONE III, HOYT Weeks in Treatment: 3 Encounter Discharge Information Items Discharge Condition: Stable Ambulatory Status: Walker Discharge Destination: Home Transportation: Private Auto Accompanied By: self Schedule Follow-up Appointment: Yes Clinical Summary of Care: Electronic Signature(s) Signed: 08/12/2018 5:41:45 PM By: Elliot GurneyWoody, BSN, RN, CWS, Kim RN, BSN Entered By: Elliot GurneyWoody, BSN, RN, CWS, Kim on 08/12/2018  11:00:37 Erin [name], [name] ([id number]) -------------------------------------------------------------------------------- Lower Extremity Assessment Details Patient Name: Erin [name], [name] Date of Service: 08/12/2018 10:30 AM Medical Record Number: [id number] Patient Account Number: [id number] Date of Birth/Sex: 08/21/1945 (73 y.o. F) Treating RN: Rodell PernaScott, Dajea Primary Care Levora Werden: PATIENT, NO Other Clinician: Referring Aysia Lowder: Referral, Self Treating Jaydah Stahle/Extender: STONE III, HOYT Weeks in Treatment: 3 Edema Assessment Assessed: [Left: No] [Right: No] Edema: [Left: N] [Right: o] Vascular Assessment Pulses: Dorsalis Pedis Palpable: [Right:Yes] Electronic Signature(s) Signed: 08/12/2018 11:48:59 AM By: Rodell PernaScott, Dajea Entered By: Rodell PernaScott, Dajea on 08/12/2018 10:50:08 Erin [name], [name] ([id number]) -------------------------------------------------------------------------------- Multi Wound Chart Details Patient Name: Erin [name], [name] Date of Service: 08/12/2018 10:30 AM Medical Record Number: [id number] Patient Account Number: [id number] Date of Birth/Sex: 06/26/1945 (73 y.o. F) Treating RN: Huel CoventryWoody, Kim Primary Care Stanton Kissoon: PATIENT, NO Other Clinician: Referring Albirta Rhinehart: Referral, Self Treating Ree Alcalde/Extender: STONE III, HOYT Weeks in Treatment: 3 Vital Signs Height(in): 60 Pulse(bpm): 75 Weight(lbs): 213 Blood Pressure(mmHg): 119/53 Body Mass Index(BMI): 42 Temperature(F): 98.4 Respiratory Rate 16 (breaths/min): Photos: [N/A:N/A] Wound Location: Right Lower Leg - Lateral N/A N/A Wounding Event: Blister N/A N/A Primary Etiology: Venous Leg Ulcer N/A N/A Comorbid History: Congestive Heart Failure, N/A N/A Coronary Artery Disease, Hypertension Date Acquired: 07/01/2018 N/A N/A Weeks of Treatment: 3 N/A N/A Wound Status: Open N/A N/A Measurements L x W x D 1.2x2.1x0.1 N/A N/A (cm) Area (cm) : 1.979 N/A N/A Volume (cm) : 0.198 N/A N/A % Reduction in Area: 97.90%  N/A N/A % Reduction in Volume: 97.90% N/A N/A Classification: Partial Thickness N/A N/A Exudate Amount: Medium N/A N/A Exudate Type: Serous N/A N/A Exudate Color: amber N/A N/A Wound Margin: Flat and Intact N/A N/A Granulation Amount: Medium (34-66%) N/A N/A Granulation Quality: Pink N/A N/A Necrotic Amount: Medium (34-66%) N/A N/A Necrotic Tissue: Eschar, Adherent Slough N/A N/A Exposed Structures: Fat Layer (Subcutaneous N/A N/A Tissue) Exposed: Yes Fascia: No Tendon: No Muscle: No [name], [name] ([id number]) Joint: No Bone: No Epithelialization: Large (  67-100%) N/A N/A Treatment Notes Electronic Signature(s) Signed: 08/12/2018 5:41:45 PM By: Gretta Cool, BSN, RN, CWS, Kim RN, BSN Entered By: Gretta Cool, BSN, RN, CWS, Kim on 08/12/2018 10:56:51 Erin [name], Erin [name] ([id number]) -------------------------------------------------------------------------------- [place] Details Patient Name: Erin [name], Erin [name]. Date of Service: 08/12/2018 10:30 AM Medical Record Number: [id number] Patient Account Number: [id number] Date of Birth/Sex: 21-Mar-1945 (73 y.o. F) Treating RN: Cornell Barman Primary Care Deerica Waszak: PATIENT, NO Other Clinician: Referring Edem Tiegs: Referral, Self Treating Jasmine Mcbeth/Extender: STONE III, HOYT Weeks in Treatment: 3 Active Inactive Abuse / Safety / Falls / Self Care Management Nursing Diagnoses: Potential for falls Goals: Patient will remain injury free related to falls Date Initiated: 07/22/2018 Target Resolution Date: 11/04/2018 Goal Status: Active Interventions: Assess fall risk on admission and as needed Notes: Orientation to the Wound Care Program Nursing Diagnoses: Knowledge deficit related to the wound healing center program Goals: Patient/caregiver will verbalize understanding of the [place] Program Date Initiated: 07/22/2018 Target Resolution Date: 11/04/2018 Goal Status: Active Interventions: Provide education on orientation  to the wound center Notes: Venous Leg Ulcer Nursing Diagnoses: Potential for venous Insuffiency (use before diagnosis confirmed) Goals: Patient will maintain optimal edema control Date Initiated: 07/22/2018 Target Resolution Date: 11/04/2018 Goal Status: Active Interventions: Compression as ordered NAMI, STRAWDER ([id number]) Notes: Wound/Skin Impairment Nursing Diagnoses: Impaired tissue integrity Goals: Ulcer/skin breakdown will heal within 14 weeks Date Initiated: 07/22/2018 Target Resolution Date: 11/04/2018 Goal Status: Active Interventions: Assess patient/caregiver ability to obtain necessary supplies Assess patient/caregiver ability to perform ulcer/skin care regimen upon admission and as needed Assess ulceration(s) every visit Notes: Electronic Signature(s) Signed: 08/12/2018 5:41:45 PM By: Gretta Cool, BSN, RN, CWS, Kim RN, BSN Entered By: Gretta Cool, BSN, RN, CWS, Kim on 08/12/2018 10:56:44 [name] ([id number]) -------------------------------------------------------------------------------- Pain Assessment Details Patient Name: LILANA, BLASKO. Date of Service: 08/12/2018 10:30 AM Medical Record Number: [id number] Patient Account Number: [id number] Date of Birth/Sex: [birth year] (73 y.o. F) Treating RN: Army Melia Primary Care Felicia Both: PATIENT, NO Other Clinician: Referring Tannie Koskela: Referral, Self Treating Aser Nylund/Extender: STONE III, HOYT Weeks in Treatment: 3 Active Problems Location of Pain Severity and Description of Pain Patient Has Paino No Site Locations Pain Management and Medication Current Pain Management: Electronic Signature(s) Signed: 08/12/2018 11:48:59 AM By: Army Melia Entered By: Army Melia on 08/12/2018 10:39:11 [name] ([id number]) -------------------------------------------------------------------------------- Patient/Caregiver Education Details Patient Name: [name]. Date of Service: 08/12/2018 10:30 AM Medical  Record Number: [id number] Patient Account Number: [id number] Date of Birth/Gender: [birth year] (73 y.o. F) Treating RN: Cornell Barman Primary Care Physician: PATIENT, NO Other Clinician: Referring Physician: Referral, Self Treating Physician/Extender: Melburn Hake, HOYT Weeks in Treatment: 3 Education Assessment Education Provided To: Patient Education Topics Provided Venous: Handouts: Controlling Swelling with Multilayered Compression Wraps Methods: Demonstration, Explain/Verbal Responses: State content correctly Wound/Skin Impairment: Handouts: Caring for Your Ulcer Methods: Demonstration, Explain/Verbal Responses: State content correctly Electronic Signature(s) Signed: 08/12/2018 5:41:45 PM By: Gretta Cool, BSN, RN, CWS, Kim RN, BSN Entered By: Gretta Cool, BSN, RN, CWS, Kim on 08/12/2018 10:59:44 [name] ([id number]) -------------------------------------------------------------------------------- Wound Assessment Details Patient Name: JOCHEBED, BILLS. Date of Service: 08/12/2018 10:30 AM Medical Record Number: [id number] Patient Account Number: [id number] Date of Birth/Sex: [birth year] (73 y.o. F) Treating RN: Army Melia Primary Care Johann Gascoigne: PATIENT, NO Other Clinician: Referring Ludell Zacarias: Referral, Self Treating Abdullah Rizzi/Extender: STONE III, HOYT Weeks in Treatment: 3 Wound Status Wound Number: 1 Primary Venous Leg Ulcer Etiology: Wound Location: Right Lower Leg - Lateral Wound Status: Open Wounding Event: Blister Comorbid Congestive Heart Failure, Coronary  Artery Date Acquired: 07/01/2018 History: Disease, Hypertension Weeks Of Treatment: 3 Clustered Wound: No Photos Wound Measurements Length: (cm) 1.2 Width: (cm) 2.1 Depth: (cm) 0.1 Area: (cm) 1.979 Volume: (cm) 0.198 % Reduction in Area: 97.9% % Reduction in Volume: 97.9% Epithelialization: Large (67-100%) Tunneling: No Undermining: No Wound Description Classification: Partial Thickness Foul Odor Af Wound  Margin: Flat and Intact Slough/Fibri Exudate Amount: Medium Exudate Type: Serous Exudate Color: amber ter Cleansing: No no Yes Wound Bed Granulation Amount: Medium (34-66%) Exposed Structure Granulation Quality: Pink Fascia Exposed: No Necrotic Amount: Medium (34-66%) Fat Layer (Subcutaneous Tissue) Exposed: Yes Necrotic Quality: Eschar, Adherent Slough Tendon Exposed: No Muscle Exposed: No Joint Exposed: No Bone Exposed: No Treatment Notes JIREH, TEITELBAUM. ([id number]) Wound #1 (Right, Lateral Lower Leg) Notes prisma, abd, 3 layer wrap with unna to anchor Electronic Signature(s) Signed: 08/12/2018 11:48:59 AM By: Rodell Perna Entered By: Rodell Perna on 08/12/2018 10:49:39 Barkalow, Arlana Pouch ([id number]) -------------------------------------------------------------------------------- Vitals Details Patient Name: Erin Bowens. Date of Service: 08/12/2018 10:30 AM Medical Record Number: [id number] Patient Account Number: [id number] Date of Birth/Sex: 08/24/1945 (73 y.o. F) Treating RN: Rodell Perna Primary Care Shina Wass: PATIENT, NO Other Clinician: Referring Rishita Petron: Referral, Self Treating Constantina Laseter/Extender: STONE III, HOYT Weeks in Treatment: 3 Vital Signs Time Taken: 10:39 Temperature (F): 98.4 Height (in): 60 Pulse (bpm): 75 Weight (lbs): 213 Respiratory Rate (breaths/min): 16 Body Mass Index (BMI): 41.6 Blood Pressure (mmHg): 119/53 Reference Range: 80 - 120 mg / dl Electronic Signature(s) Signed: 08/12/2018 11:48:59 AM By: Rodell Perna Entered By: Rodell Perna on 08/12/2018 10:40:52

## 2018-08-12 NOTE — Progress Notes (Addendum)
AKIRAH, STORCK ([id number]) Visit Report for 08/12/2018 Chief Complaint Document Details Patient Name: Erin [name], Erin [name]. Date of Service: 08/12/2018 10:30 AM Medical Record Number: [id number] Patient Account Number: [id number] Date of Birth/Sex: [birth year] (73 y.o. F) Treating RN: Cornell Barman Primary Care Provider: PATIENT, NO Other Clinician: Referring Provider: Referral, Self Treating Provider/Extender: Melburn Hake, Audy Dauphine Weeks in Treatment: 3 Information Obtained from: Patient Chief Complaint Right LE Ulcer Electronic Signature(s) Signed: 08/12/2018 10:54:54 AM By: Worthy Keeler PA-C Entered By: Worthy Keeler on 08/12/2018 10:54:53 Eppard, Placido Sou ([id number]) -------------------------------------------------------------------------------- HPI Details Patient Name: Erin [name]. Date of Service: 08/12/2018 10:30 AM Medical Record Number: [id number] Patient Account Number: [id number] Date of Birth/Sex: 1945-09-23 (74 y.o. F) Treating RN: Cornell Barman Primary Care Provider: PATIENT, NO Other Clinician: Referring Provider: Referral, Self Treating Provider/Extender: Melburn Hake, Mieczyslaw Stamas Weeks in Treatment: 3 History of Present Illness HPI Description: 07/22/18 patient presents today for initial evaluation regarding issues that she is having with wounds on the right lateral lower leg. The good news is that these actually appear to be showing signs of already feeling which is good news. There still a lot of maceration and drainage as well as edema noted at this point. This is something that from the edema standpoint has been going on for some time the patient obviously as lymphedema among issues with venous stasis as well. With that being said I do believe at this point that she's having some discomfort at the site fortunately there's no evidence of active infection at this time which is good news. The patient has been having issues as well it sounds like with something completely separate in  regard to pain she's unable to raise her legs and elevate them due to the fact that whenever she does she has a burning pain that goes into her right hip laterally and then runs down into her foot in times as well. This actually appears to be something emanating from her back which I discussed with her today as well I think that's completely separate from the chronic venous stasis and lymphedema which we are going to be managing at this point. No fevers, chills, nausea, or vomiting noted at this time. 07/29/18 on evaluation today patient appears to be doing better in regard to the overall wound size which is good news. With that being said she is still having a significant amount of pain some of this is directly related to be open wound and even to the fact that there might be a little bit of infection at this point. With that being said things are not looking too poorly at this point which is good news. I did obtain a culture today to test this further. With that being said with regard to the orthopedic referral this has been sent they have not heard from the office as of yet. Advised that they do not hear anything by Monday that they give the office a call. 08/05/2018 on evaluation today patient appears to be doing better with regard to her lower extremity ulcer. There is just a very small area that still has any opening remaining at this time which is excellent news. She has been tolerating the compression wrap without complication. I do believe this has been extremely beneficial for her which is excellent news. Again we have been utilizing a 3 layer compression. She was also placed on Cipro most recently I had to change her from doxycycline to Cipro due to the culture findings. I do not  see any evidence of more significant infection compared to last week. 08/12/2018 on evaluation today patient appears to be doing very well with regard to her right lower extremity ulcer. She has been tolerating the  dressing changes without complication. Fortunately there is no signs of infection at this time. Overall I am very pleased with the progress she is made the wound is definitely measuring smaller today compared to last visit. She did see Okey RegalCarol who is Dr. [facility] [place]. Nonetheless she did feel like the patient was having some component of back pain causing her leg pain and recommended physical therapy to start with. Pending how things progress they may consider injections in the future but really do not want to do that until her wound is healed the good news is the wound seems to be progressing quite nicely. Electronic Signature(s) Signed: 08/12/2018 11:01:41 AM By: Lenda KelpStone III, Noorah Giammona PA-C Entered By: Lenda KelpStone III, Phila Shoaf on 08/12/2018 11:01:40 Erin BowensHAMILTON, Jesiah M. ([id number]) -------------------------------------------------------------------------------- Physical Exam Details Patient Name: Erin BowensHAMILTON, Danai M. Date of Service: 08/12/2018 10:30 AM Medical Record Number: [id number] Patient Account Number: [id number] Date of Birth/Sex: 07/06/1945 (73 y.o. F) Treating RN: Huel CoventryWoody, Kim Primary Care Provider: PATIENT, NO Other Clinician: Referring Provider: Referral, Self Treating Provider/Extender: STONE III, Maysin Carstens Weeks in Treatment: 3 Constitutional Well-nourished and well-hydrated in no acute distress. Respiratory normal breathing without difficulty. clear to auscultation bilaterally. Cardiovascular regular rate and rhythm with normal S1, S2. Psychiatric this patient is able to make decisions and demonstrates good insight into disease process. Alert and Oriented x 3. pleasant and cooperative. Notes Upon inspection today patient's wound on the right lower extremity is doing much better she has a large area of blistered skin that is actually flaking off because it has healed and done so great. Her leg is significantly smaller as far as swelling  is concerned the compression wrap is doing an excellent job. No sharp debridement was even necessary today which is even better news. Electronic Signature(s) Signed: 08/12/2018 11:02:21 AM By: Lenda KelpStone III, Trygve Thal PA-C Entered By: Lenda KelpStone III, Steffi Noviello on 08/12/2018 11:02:20 Erin BowensHAMILTON, Damyah M. ([id number]) -------------------------------------------------------------------------------- Physician Orders Details Patient Name: Erin BowensHAMILTON, Allyson M. Date of Service: 08/12/2018 10:30 AM Medical Record Number: [id number] Patient Account Number: [id number] Date of Birth/Sex: 10/16/1945 (73 y.o. F) Treating RN: Huel CoventryWoody, Kim Primary Care Provider: PATIENT, NO Other Clinician: Referring Provider: Referral, Self Treating Provider/Extender: STONE III, Dreamer Carillo Weeks in Treatment: 3 Verbal / Phone Orders: No Diagnosis Coding ICD-10 Coding Code Description I87.2 Venous insufficiency (chronic) (peripheral) I89.0 Lymphedema, not elsewhere classified L97.812 Non-pressure chronic ulcer of other part of right lower leg with fat layer exposed N18.3 Chronic kidney disease, stage 3 (moderate) I50.42 Chronic combined systolic (congestive) and diastolic (congestive) heart failure I10 Essential (primary) hypertension I48.0 Paroxysmal atrial fibrillation Wound Cleansing Wound #1 Right,Lateral Lower Leg o Clean wound with Normal Saline. o May shower with protection. - Please do not get your wrap wet Anesthetic (add to Medication List) Wound #1 Right,Lateral Lower Leg o Topical Lidocaine 4% cream applied to wound bed prior to debridement (In Clinic Only). Primary Wound Dressing Wound #1 Right,Lateral Lower Leg o Silver Collagen Secondary Dressing Wound #1 Right,Lateral Lower Leg o ABD pad Dressing Change Frequency Wound #1 Right,Lateral Lower Leg o Change dressing every week Follow-up Appointments o Return Appointment in 1 week. Edema Control Wound #1 Right,Lateral Lower Leg o 3 Layer Compression System -  Right Lower Extremity Notes Erin BowensHAMILTON, Iyesha M. ([id number]) Reviewed  results of [facility] visit with patient. Electronic Signature(s) Signed: 08/12/2018 5:41:45 PM By: Elliot Gurney, BSN, RN, CWS, Kim RN, BSN Signed: 08/13/2018 11:53:46 PM By: Lenda Kelp PA-C Entered By: Elliot Gurney BSN, RN, CWS, Kim on 08/12/2018 10:58:49 DELISE, SIMENSON ([id number]) -------------------------------------------------------------------------------- Problem List Details Patient Name: EMYAH, ROZNOWSKI. Date of Service: 08/12/2018 10:30 AM Medical Record Number: [id number] Patient Account Number: [id number] Date of Birth/Sex: [birth year] (73 y.o. F) Treating RN: Huel Coventry Primary Care Provider: PATIENT, NO Other Clinician: Referring Provider: Referral, Self Treating Provider/Extender: Linwood Dibbles, Blanche Scovell Weeks in Treatment: 3 Active Problems ICD-10 Evaluated Encounter Code Description Active Date Today Diagnosis I87.2 Venous insufficiency (chronic) (peripheral) 07/22/2018 No Yes I89.0 Lymphedema, not elsewhere classified 07/24/2018 No Yes L97.812 Non-pressure chronic ulcer of other part of right lower leg 07/22/2018 No Yes with fat layer exposed N18.3 Chronic kidney disease, stage 3 (moderate) 07/22/2018 No Yes I50.42 Chronic combined systolic (congestive) and diastolic 07/22/2018 No Yes (congestive) heart failure I10 Essential (primary) hypertension 07/22/2018 No Yes I48.0 Paroxysmal atrial fibrillation 07/22/2018 No Yes Inactive Problems Resolved Problems Electronic Signature(s) Signed: 08/12/2018 10:54:46 AM By: Lenda Kelp PA-C Entered By: Lenda Kelp on 08/12/2018 10:54:45 Hargreaves, Arlana Pouch ([id number]) -------------------------------------------------------------------------------- Progress Note Details Patient Name: Erin Bowens. Date of Service: 08/12/2018 10:30 AM Medical Record Number: [id number] Patient Account Number: [id number] Date of Birth/Sex: [birth year] (73 y.o. F) Treating RN: Huel Coventry Primary Care Provider: PATIENT, NO Other Clinician: Referring Provider: Referral, Self Treating Provider/Extender: Linwood Dibbles, Erika Hussar Weeks in Treatment: 3 Subjective Chief Complaint Information obtained from Patient Right LE Ulcer History of Present Illness (HPI) 07/22/18 patient presents today for initial evaluation regarding issues that she is having with wounds on the right lateral lower leg. The good news is that these actually appear to be showing signs of already feeling which is good news. There still a lot of maceration and drainage as well as edema noted at this point. This is something that from the edema standpoint has been going on for some time the patient obviously as lymphedema among issues with venous stasis as well. With that being said I do believe at this point that she's having some discomfort at the site fortunately there's no evidence of active infection at this time which is good news. The patient has been having issues as well it sounds like with something completely separate in regard to pain she's unable to raise her legs and elevate them due to the fact that whenever she does she has a burning pain that goes into her right hip laterally and then runs down into her foot in times as well. This actually appears to be something emanating from her back which I discussed with her today as well I think that's completely separate from the chronic venous stasis and lymphedema which we are going to be managing at this point. No fevers, chills, nausea, or vomiting noted at this time. 07/29/18 on evaluation today patient appears to be doing better in regard to the overall wound size which is good news. With that being said she is still having a significant amount of pain some of this is directly related to be open wound and even to the fact that there might be a little bit of infection at this point. With that being said things are not looking too poorly at this point which is  good news. I did obtain a culture today to test this further. With that being said with regard to the orthopedic referral  this has been sent they have not heard from the office as of yet. Advised that they do not hear anything by Monday that they give the office a call. 08/05/2018 on evaluation today patient appears to be doing better with regard to her lower extremity ulcer. There is just a very small area that still has any opening remaining at this time which is excellent news. She has been tolerating the compression wrap without complication. I do believe this has been extremely beneficial for her which is excellent news. Again we have been utilizing a 3 layer compression. She was also placed on Cipro most recently I had to change her from doxycycline to Cipro due to the culture findings. I do not see any evidence of more significant infection compared to last week. 08/12/2018 on evaluation today patient appears to be doing very well with regard to her right lower extremity ulcer. She has been tolerating the dressing changes without complication. Fortunately there is no signs of infection at this time. Overall I am very pleased with the progress she is made the wound is definitely measuring smaller today compared to last visit. She did see Okey RegalCarol who is Dr. Ethelene Halamos PA at [facility] [place]. Nonetheless she did feel like the patient was having some component of back pain causing her leg pain and recommended physical therapy to start with. Pending how things progress they may consider injections in the future but really do not want to do that until her wound is healed the good news is the wound seems to be progressing quite nicely. Patient History Information obtained from Patient. Family History Cancer - Siblings, Diabetes - Father,Siblings, Heart Disease - Mother,Siblings, Hypertension - Mother, Lung Disease - Siblings, No family history of Hereditary  Spherocytosis, Kidney Disease, Seizures, Stroke, Thyroid Problems, Tuberculosis. Erin BowensHAMILTON, Xitlalic M. ([id number]) Social History Never smoker, Marital Status - Single, Alcohol Use - Never, Drug Use - No History, Caffeine Use - Never. Medical History Eyes Denies history of Cataracts, Glaucoma, Optic Neuritis Ear/Nose/Mouth/Throat Denies history of Chronic sinus problems/congestion, Middle ear problems Hematologic/Lymphatic Denies history of Anemia, Hemophilia, Human Immunodeficiency Virus, Lymphedema, Sickle Cell Disease Respiratory Denies history of Aspiration, Asthma, Chronic Obstructive Pulmonary Disease (COPD), Pneumothorax, Sleep Apnea, Tuberculosis Cardiovascular Patient has history of Congestive Heart Failure, Coronary Artery Disease, Hypertension Denies history of Angina, Arrhythmia, Deep Vein Thrombosis, Hypotension, Myocardial Infarction, Peripheral Arterial Disease, Peripheral Venous Disease, Phlebitis, Vasculitis Gastrointestinal Denies history of Cirrhosis , Colitis, Crohn s, Hepatitis A, Hepatitis B, Hepatitis C Endocrine Denies history of Type I Diabetes, Type II Diabetes Genitourinary Denies history of End Stage Renal Disease Immunological Denies history of Lupus Erythematosus, Raynaud s, Scleroderma Integumentary (Skin) Denies history of History of Burn, History of pressure wounds Musculoskeletal Denies history of Gout, Rheumatoid Arthritis, Osteoarthritis, Osteomyelitis Neurologic Denies history of Dementia, Neuropathy, Quadriplegia, Paraplegia, Seizure Disorder Oncologic Denies history of Received Chemotherapy, Received Radiation Psychiatric Denies history of Anorexia/bulimia, Confinement Anxiety Review of Systems (ROS) Constitutional Symptoms (General Health) Denies complaints or symptoms of Fatigue, Fever, Chills, Marked Weight Change. Respiratory Denies complaints or symptoms of Chronic or frequent coughs, Shortness of Breath. Cardiovascular Complains or  has symptoms of LE edema. Denies complaints or symptoms of Chest pain. Psychiatric Denies complaints or symptoms of Anxiety, Claustrophobia. Objective Constitutional Erin BowensHAMILTON, Bellah M. ([id number]) Well-nourished and well-hydrated in no acute distress. Vitals Time Taken: 10:39 AM, Height: 60 in, Weight: 213 lbs, BMI: 41.6, Temperature: 98.4 F, Pulse: 75 bpm, Respiratory Rate: 16 breaths/min, Blood Pressure:  119/53 mmHg. Respiratory normal breathing without difficulty. clear to auscultation bilaterally. Cardiovascular regular rate and rhythm with normal S1, S2. Psychiatric this patient is able to make decisions and demonstrates good insight into disease process. Alert and Oriented x 3. pleasant and cooperative. General Notes: Upon inspection today patient's wound on the right lower extremity is doing much better she has a large area of blistered skin that is actually flaking off because it has healed and done so great. Her leg is significantly smaller as far as swelling is concerned the compression wrap is doing an excellent job. No sharp debridement was even necessary today which is even better news. Integumentary (Hair, Skin) Wound #1 status is Open. Original cause of wound was Blister. The wound is located on the Right,Lateral Lower Leg. The wound measures 1.2cm length x 2.1cm width x 0.1cm depth; 1.979cm^2 area and 0.198cm^3 volume. There is Fat Layer (Subcutaneous Tissue) Exposed exposed. There is no tunneling or undermining noted. There is a medium amount of serous drainage noted. The wound margin is flat and intact. There is medium (34-66%) pink granulation within the wound bed. There is a medium (34-66%) amount of necrotic tissue within the wound bed including Eschar and Adherent Slough. Assessment Active Problems ICD-10 Venous insufficiency (chronic) (peripheral) Lymphedema, not elsewhere classified Non-pressure chronic ulcer of other part of right lower leg with fat layer  exposed Chronic kidney disease, stage 3 (moderate) Chronic combined systolic (congestive) and diastolic (congestive) heart failure Essential (primary) hypertension Paroxysmal atrial fibrillation Plan Wound Cleansing: Wound #1 Right,Lateral Lower Leg: Clean wound with Normal Saline. May shower with protection. - Please do not get your wrap wet Anesthetic (add to Medication List): Wound #1 Right,Lateral Lower Leg: Erin BowensHAMILTON, Sofhia M. ([id number]) Topical Lidocaine 4% cream applied to wound bed prior to debridement (In Clinic Only). Primary Wound Dressing: Wound #1 Right,Lateral Lower Leg: Silver Collagen Secondary Dressing: Wound #1 Right,Lateral Lower Leg: ABD pad Dressing Change Frequency: Wound #1 Right,Lateral Lower Leg: Change dressing every week Follow-up Appointments: Return Appointment in 1 week. Edema Control: Wound #1 Right,Lateral Lower Leg: 3 Layer Compression System - Right Lower Extremity General Notes: Reviewed results of [facility] visit with patient. 1. I am in a recommend that we go ahead and continue with the collagen dressing as this seems to be beneficial for the patient. She is in agreement the plan. 2. I will continue with the 3 layer compression wrap as well since this seems to be controlling her swelling in an excellent fashion. 3. I recommend that she continue to elevate her legs as much as possible and the patient is in agreement with that plan as well. We will see patient back for reevaluation in 1 week here in the clinic. If anything worsens or changes patient will contact our office for additional recommendations. Electronic Signature(s) Signed: 08/12/2018 11:02:51 AM By: Lenda KelpStone III, Kamerin Grumbine PA-C Entered By: Lenda KelpStone III, Garyn Arlotta on 08/12/2018 11:02:51 Erin BowensHAMILTON, Little M. ([id number]) -------------------------------------------------------------------------------- ROS/PFSH Details Patient Name: Erin BowensHAMILTON, Fayne M. Date of Service: 08/12/2018 10:30 AM Medical  Record Number: [id number] Patient Account Number: [id number] Date of Birth/Sex: 06/07/1945 (73 y.o. F) Treating RN: Huel CoventryWoody, Kim Primary Care Provider: PATIENT, NO Other Clinician: Referring Provider: Referral, Self Treating Provider/Extender: STONE III, Chasta Deshpande Weeks in Treatment: 3 Information Obtained From Patient Constitutional Symptoms (General Health) Complaints and Symptoms: Negative for: Fatigue; Fever; Chills; Marked Weight Change Respiratory Complaints and Symptoms: Negative for: Chronic or frequent coughs; Shortness of Breath Medical History: Negative for: Aspiration; Asthma; Chronic Obstructive Pulmonary Disease (COPD);  Pneumothorax; Sleep Apnea; Tuberculosis Cardiovascular Complaints and Symptoms: Positive for: LE edema Negative for: Chest pain Medical History: Positive for: Congestive Heart Failure; Coronary Artery Disease; Hypertension Negative for: Angina; Arrhythmia; Deep Vein Thrombosis; Hypotension; Myocardial Infarction; Peripheral Arterial Disease; Peripheral Venous Disease; Phlebitis; Vasculitis Psychiatric Complaints and Symptoms: Negative for: Anxiety; Claustrophobia Medical History: Negative for: Anorexia/bulimia; Confinement Anxiety Eyes Medical History: Negative for: Cataracts; Glaucoma; Optic Neuritis Ear/Nose/Mouth/Throat Medical History: Negative for: Chronic sinus problems/congestion; Middle ear problems Hematologic/Lymphatic Medical History: Negative for: Anemia; Hemophilia; Human Immunodeficiency Virus; Lymphedema; Sickle Cell Disease DAELA, STALLARD ([id number]) Gastrointestinal Medical History: Negative for: Cirrhosis ; Colitis; Crohnos; Hepatitis A; Hepatitis B; Hepatitis C Endocrine Medical History: Negative for: Type I Diabetes; Type II Diabetes Genitourinary Medical History: Negative for: End Stage Renal Disease Immunological Medical History: Negative for: Lupus Erythematosus; Raynaudos; Scleroderma Integumentary (Skin) Medical  History: Negative for: History of Burn; History of pressure wounds Musculoskeletal Medical History: Negative for: Gout; Rheumatoid Arthritis; Osteoarthritis; Osteomyelitis Neurologic Medical History: Negative for: Dementia; Neuropathy; Quadriplegia; Paraplegia; Seizure Disorder Oncologic Medical History: Negative for: Received Chemotherapy; Received Radiation Immunizations Pneumococcal Vaccine: Received Pneumococcal Vaccination: No Implantable Devices Yes Family and Social History Cancer: Yes - Siblings; Diabetes: Yes - Father,Siblings; Heart Disease: Yes - Mother,Siblings; Hereditary Spherocytosis: No; Hypertension: Yes - Mother; Kidney Disease: No; Lung Disease: Yes - Siblings; Seizures: No; Stroke: No; Thyroid Problems: No; Tuberculosis: No; Never smoker; Marital Status - Single; Alcohol Use: Never; Drug Use: No History; Caffeine Use: Never; Financial Concerns: No; Food, Clothing or Shelter Needs: No; Support System Lacking: No; Transportation Concerns: No Physician Affirmation I have reviewed and agree with the above information. Electronic Signature(s) JASLYNN, RISER ([id number]) Signed: 08/12/2018 5:41:45 PM By: Elliot Gurney BSN, RN, CWS, Kim RN, BSN Signed: 08/13/2018 11:53:46 PM By: Lenda Kelp PA-C Entered By: Lenda Kelp on 08/12/2018 11:01:58 MERIEM, MIELNIK ([id number]) -------------------------------------------------------------------------------- SuperBill Details Patient Name: EEVEE, WHITEHORSE. Date of Service: 08/12/2018 Medical Record Number: [id number] Patient Account Number: [id number] Date of Birth/Sex: [birth year] (73 y.o. 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 N18.3 Chronic kidney disease, stage 3 (moderate) I50.42 Chronic combined systolic (congestive) and diastolic (congestive) heart failure I10 Essential (primary) hypertension I48.0 Paroxysmal atrial fibrillation Facility Procedures CPT4 Code: 30076226 Description: (Facility Use Only) [phone] - APPLY MULTLAY COMPRS LWR LT LEG Modifier: Quantity: 1 Physician Procedures CPT4 Code Description: [id number] - WC PHYS LEVEL 4 - EST PT ICD-10 Diagnosis Description I87.2 Venous insufficiency (chronic) (peripheral) I89.0 Lymphedema, not elsewhere classified 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

## 2018-08-17 ENCOUNTER — Other Ambulatory Visit: Payer: Self-pay | Admitting: Endocrinology

## 2018-08-19 ENCOUNTER — Encounter: Payer: Medicare Other | Admitting: Physician Assistant

## 2018-08-19 ENCOUNTER — Other Ambulatory Visit: Payer: Self-pay

## 2018-08-19 DIAGNOSIS — L97812 Non-pressure chronic ulcer of other part of right lower leg with fat layer exposed: Secondary | ICD-10-CM | POA: Diagnosis not present

## 2018-08-19 NOTE — Progress Notes (Addendum)
Erin [name], Mikell M. ([id number]) Visit Report for 08/19/2018 Arrival Information Details Patient Name: Erin [name], [name] Date of Service: 08/19/2018 12:45 PM Medical Record Number: [id number] Patient Account Number: [id number] Date of Birth/Sex: 12/04/1945 (73 y.o. F) Treating RN: Arnette NorrisBiell, Kristina Primary Care Nela Bascom: PATIENT, NO Other Clinician: Referring Kwabena Strutz: Referral, Self Treating Jozlyn Schatz/Extender: STONE III, HOYT Weeks in Treatment: 4 Visit Information History Since Last Visit Added or deleted any medications: No Patient Arrived: Walker Any new allergies or adverse reactions: No Arrival Time: 12:51 Had a fall or experienced change in No Accompanied By: daughter activities of daily living that may affect Transfer Assistance: None risk of falls: Patient Identification Verified: Yes Signs or symptoms of abuse/neglect since last visito No Secondary Verification Process Completed: Yes Hospitalized since last visit: No Has Dressing in Place as Prescribed: Yes Has Compression in Place as Prescribed: Yes Pain Present Now: Yes Electronic Signature(s) Signed: 08/19/2018 3:49:40 PM By: Arnette NorrisBiell, Kristina Entered By: Arnette NorrisBiell, Kristina on 08/19/2018 12:51:36 Kiedrowski, [name] ([id number]) -------------------------------------------------------------------------------- Compression Therapy Details Patient Name: Erin [name], [name] Date of Service: 08/19/2018 12:45 PM Medical Record Number: [id number] Patient Account Number: [id number] Date of Birth/Sex: 02/02/1945 (73 y.o. F) Treating RN: Curtis Sitesorthy, Joanna Primary Care Carrera Kiesel: PATIENT, NO Other Clinician: Referring Sheyli Horwitz: Referral, Self Treating Rolf Fells/Extender: STONE III, HOYT Weeks in Treatment: 4 Compression Therapy Performed for Wound Assessment: Wound #1 Right,Lateral Lower Leg Performed By: Clinician Curtis Sitesorthy, Joanna, RN Compression Type: Three Layer Pre Treatment ABI: 1 Post Procedure Diagnosis Same as Pre-procedure Electronic  Signature(s) Signed: 08/19/2018 4:41:14 PM By: Curtis Sitesorthy, Joanna Entered By: Curtis Sitesorthy, Joanna on 08/19/2018 13:13:04 Erin [name], Levada M. ([id number]) -------------------------------------------------------------------------------- Encounter Discharge Information Details Patient Name: Erin [name], [name] Date of Service: 08/19/2018 12:45 PM Medical Record Number: [id number] Patient Account Number: [id number] Date of Birth/Sex: 11/22/1945 (73 y.o. F) Treating RN: Curtis Sitesorthy, Joanna Primary Care Vayda Dungee: PATIENT, NO Other Clinician: Referring Nyna Chilton: Referral, Self Treating Vihana Kydd/Extender: STONE III, HOYT Weeks in Treatment: 4 Encounter Discharge Information Items Discharge Condition: Stable Ambulatory Status: Walker Discharge Destination: Home Transportation: Private Auto Accompanied By: daughter Schedule Follow-up Appointment: Yes Clinical Summary of Care: Electronic Signature(s) Signed: 08/19/2018 4:41:14 PM By: Curtis Sitesorthy, Joanna Entered By: Curtis Sitesorthy, Joanna on 08/19/2018 13:14:55 [place], [name] ([id number]) -------------------------------------------------------------------------------- Lower Extremity Assessment Details Patient Name: Erin [name], [name] Date of Service: 08/19/2018 12:45 PM Medical Record Number: [id number] Patient Account Number: [id number] Date of Birth/Sex: 07/09/1945 (73 y.o. F) Treating RN: Arnette NorrisBiell, Kristina Primary Care Josslynn Mentzer: PATIENT, NO Other Clinician: Referring Wanisha Shiroma: Referral, Self Treating Doxie Augenstein/Extender: STONE III, HOYT Weeks in Treatment: 4 Edema Assessment Assessed: [Left: No] [Right: No] Edema: [Left: N] [Right: o] Calf Left: Right: Point of Measurement: 30 cm From Medial Instep cm 31.6 cm Ankle Left: Right: Point of Measurement: 9 cm From Medial Instep cm 21.6 cm Vascular Assessment Pulses: Dorsalis Pedis Palpable: [Right:Yes] Electronic Signature(s) Signed: 08/19/2018 3:49:40 PM By: Arnette NorrisBiell, Kristina Entered By: Arnette NorrisBiell, Kristina on 08/19/2018  12:56:23 [place], [name] ([id number]) -------------------------------------------------------------------------------- Multi Wound Chart Details Patient Name: Erin [name], Yakira M. Date of Service: 08/19/2018 12:45 PM Medical Record Number: [id number] Patient Account Number: [id number] Date of Birth/Sex: 10/15/1945 (73 y.o. F) Treating RN: Curtis Sitesorthy, Joanna Primary Care Cristie Mckinney: PATIENT, NO Other Clinician: Referring Taleigha Pinson: Referral, Self Treating Francile Woolford/Extender: STONE III, HOYT Weeks in Treatment: 4 Vital Signs Height(in): 60 Pulse(bpm): 82 Weight(lbs): 213 Blood Pressure(mmHg): 132/54 Body Mass Index(BMI): 42 Temperature(F): 98.5 Respiratory Rate 20 (breaths/min): Photos: [N/A:N/A] Wound Location: Right Lower Leg - Lateral N/A N/A Wounding Event: Blister N/A N/A Primary Etiology: Venous Leg Ulcer N/A  N/A Comorbid History: Congestive Heart Failure, N/A N/A Coronary Artery Disease, Hypertension Date Acquired: 07/01/2018 N/A N/A Weeks of Treatment: 4 N/A N/A Wound Status: Open N/A N/A Measurements L x W x D 1x2.1x0.1 N/A N/A (cm) Area (cm) : 1.649 N/A N/A Volume (cm) : 0.165 N/A N/A % Reduction in Area: 98.20% N/A N/A % Reduction in Volume: 98.20% N/A N/A Classification: Partial Thickness N/A N/A Exudate Amount: Medium N/A N/A Exudate Type: Serous N/A N/A Exudate Color: amber N/A N/A Wound Margin: Flat and Intact N/A N/A Granulation Amount: Medium (34-66%) N/A N/A Granulation Quality: Pink N/A N/A Necrotic Amount: Medium (34-66%) N/A N/A Necrotic Tissue: Eschar, Adherent Slough N/A N/A Exposed Structures: Fat Layer (Subcutaneous N/A N/A Tissue) Exposed: Yes Fascia: No Tendon: No Muscle: No ARDRA, INSANA. ([id number]) Joint: No Bone: No Epithelialization: Large (67-100%) N/A N/A Treatment Notes Electronic Signature(s) Signed: 08/19/2018 4:41:14 PM By: Curtis Sites Entered By: Curtis Sites on 08/19/2018 13:12:49 Erin Bowens  ([id number]) -------------------------------------------------------------------------------- Multi-Disciplinary Care Plan Details Patient Name: Erin Bowens. Date of Service: 08/19/2018 12:45 PM Medical Record Number: [id number] Patient Account Number: [id number] Date of Birth/Sex: Nov 17, 1945 (73 y.o. F) Treating RN: Curtis Sites Primary Care Malania Gawthrop: PATIENT, NO Other Clinician: Referring Kariem Wolfson: Referral, Self Treating Amoreena Neubert/Extender: STONE III, HOYT Weeks in Treatment: 4 Active Inactive Abuse / Safety / Falls / Self Care Management Nursing Diagnoses: Potential for falls Goals: Patient will remain injury free related to falls Date Initiated: 07/22/2018 Target Resolution Date: 11/04/2018 Goal Status: Active Interventions: Assess fall risk on admission and as needed Notes: Orientation to the Wound Care Program Nursing Diagnoses: Knowledge deficit related to the wound healing center program Goals: Patient/caregiver will verbalize understanding of the Wound Healing Center Program Date Initiated: 07/22/2018 Target Resolution Date: 11/04/2018 Goal Status: Active Interventions: Provide education on orientation to the wound center Notes: Venous Leg Ulcer Nursing Diagnoses: Potential for venous Insuffiency (use before diagnosis confirmed) Goals: Patient will maintain optimal edema control Date Initiated: 07/22/2018 Target Resolution Date: 11/04/2018 Goal Status: Active Interventions: Compression as ordered TERAH, GIAIMO ([id number]) Notes: Wound/Skin Impairment Nursing Diagnoses: Impaired tissue integrity Goals: Ulcer/skin breakdown will heal within 14 weeks Date Initiated: 07/22/2018 Target Resolution Date: 11/04/2018 Goal Status: Active Interventions: Assess patient/caregiver ability to obtain necessary supplies Assess patient/caregiver ability to perform ulcer/skin care regimen upon admission and as needed Assess ulceration(s) every  visit Notes: Electronic Signature(s) Signed: 08/19/2018 4:41:14 PM By: Curtis Sites Entered By: Curtis Sites on 08/19/2018 13:12:40 Strausser, Arlana Pouch ([id number]) -------------------------------------------------------------------------------- Pain Assessment Details Patient Name: Erin Bowens. Date of Service: 08/19/2018 12:45 PM Medical Record Number: [id number] Patient Account Number: [id number] Date of Birth/Sex: [birth year] (73 y.o. F) Treating RN: Arnette Norris Primary Care [place]: PATIENT, NO Other Clinician: Referring Tacara Hadlock: Referral, Self Treating Amritpal Shropshire/Extender: STONE III, HOYT Weeks in Treatment: 4 Active Problems Location of Pain Severity and Description of Pain Patient Has Paino Yes Site Locations Rate the pain. Current Pain Level: 6 Pain Management and Medication Current Pain Management: Electronic Signature(s) Signed: 08/19/2018 3:49:40 PM By: Arnette Norris Entered By: Arnette Norris on 08/19/2018 12:51:54 Erin Bowens ([id number]) -------------------------------------------------------------------------------- Patient/Caregiver Education Details Patient Name: Erin Bowens. Date of Service: 08/19/2018 12:45 PM Medical Record Number: [id number] Patient Account Number: [id number] Date of Birth/Gender: [birth year] (73 y.o. F) Treating RN: Curtis Sites Primary Care Physician: PATIENT, NO Other Clinician: Referring Physician: Referral, Self Treating Physician/Extender: Linwood Dibbles, HOYT Weeks in Treatment: 4 Education Assessment Education Provided To: Patient and Caregiver Education Topics Provided Venous: Handouts: Other: need for  ongoing compression Methods: Explain/Verbal Responses: State content correctly Electronic Signature(s) Signed: 08/19/2018 4:41:14 PM By: Montey Hora Entered By: Montey Hora on 08/19/2018 13:14:14 Spira, Placido Sou  ([id number]) -------------------------------------------------------------------------------- Wound Assessment Details Patient Name: [name]. Date of Service: 08/19/2018 12:45 PM Medical Record Number: [id number] Patient Account Number: [id number] Date of Birth/Sex: 07/14/1945 (73 y.o. F) Treating RN: Harold Barban Primary Care Galo Sayed: PATIENT, NO Other Clinician: Referring Emilija Bohman: Referral, Self Treating Lanina Larranaga/Extender: STONE III, HOYT Weeks in Treatment: 4 Wound Status Wound Number: 1 Primary Venous Leg Ulcer Etiology: Wound Location: Right Lower Leg - Lateral Wound Status: Open Wounding Event: Blister Comorbid Congestive Heart Failure, Coronary Artery Date Acquired: 07/01/2018 History: Disease, Hypertension Weeks Of Treatment: 4 Clustered Wound: No Photos Wound Measurements Length: (cm) 1 Width: (cm) 2.1 Depth: (cm) 0.1 Area: (cm) 1.649 Volume: (cm) 0.165 % Reduction in Area: 98.2% % Reduction in Volume: 98.2% Epithelialization: Large (67-100%) Tunneling: No Undermining: No Wound Description Classification: Partial Thickness Foul Odor A Wound Margin: Flat and Intact Slough/Fibr Exudate Amount: Medium Exudate Type: Serous Exudate Color: amber fter Cleansing: No ino Yes Wound Bed Granulation Amount: Medium (34-66%) Exposed Structure Granulation Quality: Pink Fascia Exposed: No Necrotic Amount: Medium (34-66%) Fat Layer (Subcutaneous Tissue) Exposed: Yes Necrotic Quality: Eschar, Adherent Slough Tendon Exposed: No Muscle Exposed: No Joint Exposed: No Bone Exposed: No Treatment Notes SHALISA, MCQUADE. ([id number]) Wound #1 (Right, Lateral Lower Leg) Notes prisma, abd, 3 layer wrap with unna to anchor Electronic Signature(s) Signed: 08/19/2018 3:49:40 PM By: Harold Barban Entered By: Harold Barban on 08/19/2018 12:54:36 Raulerson, Placido Sou ([id number]) -------------------------------------------------------------------------------- Vitals  Details Patient Name: [name]. Date of Service: 08/19/2018 12:45 PM Medical Record Number: [id number] Patient Account Number: [id number] Date of Birth/Sex: 1945-11-29 (73 y.o. F) Treating RN: Harold Barban Primary Care Franciscojavier Wronski: PATIENT, NO Other Clinician: Referring Ivylynn Hoppes: Referral, Self Treating Jalesa Thien/Extender: STONE III, HOYT Weeks in Treatment: 4 Vital Signs Time Taken: 12:50 Temperature (F): 98.5 Height (in): 60 Pulse (bpm): 82 Weight (lbs): 213 Respiratory Rate (breaths/min): 20 Body Mass Index (BMI): 41.6 Blood Pressure (mmHg): 132/54 Reference Range: 80 - 120 mg / dl Electronic Signature(s) Signed: 08/19/2018 3:49:40 PM By: Harold Barban Entered By: Harold Barban on 08/19/2018 12:53:04

## 2018-08-24 NOTE — Progress Notes (Signed)
Erin [name], Erin [name]. ([id number]) Visit Report for 08/19/2018 Chief Complaint Document Details Patient Name: Erin [name], Erin [name]. Date of Service: 08/19/2018 12:45 PM Medical Record Number: [id number] Patient Account Number: [id number] Date of Birth/Sex: 06/25/1945 (73 y.o. F) Treating RN: Curtis Sitesorthy, Joanna Primary Care Provider: PATIENT, NO Other Clinician: Referring Provider: Referral, Self Treating Provider/Extender: Linwood DibblesSTONE III, HOYT Weeks in Treatment: 4 Information Obtained from: Patient Chief Complaint Right LE Ulcer Electronic Signature(s) Signed: 08/20/2018 2:28:02 AM By: Lenda KelpStone III, Hoyt PA-C Entered By: Lenda KelpStone III, Hoyt on 08/20/2018 01:03:46 Erin [name], Erin [name]. ([id number]) -------------------------------------------------------------------------------- HPI Details Patient Name: Erin [name], Erin [name]. Date of Service: 08/19/2018 12:45 PM Medical Record Number: [id number] Patient Account Number: [id number] Date of Birth/Sex: 11/23/1945 (73 y.o. F) Treating RN: Curtis Sitesorthy, Joanna Primary Care Provider: PATIENT, NO Other Clinician: Referring Provider: Referral, Self Treating Provider/Extender: Linwood DibblesSTONE III, HOYT Weeks in Treatment: 4 History of Present Illness HPI Description: 07/22/18 patient presents today for initial evaluation regarding issues that she is having with wounds on the right lateral lower leg. The good news is that these actually appear to be showing signs of already feeling which is good news. There still a lot of maceration and drainage as well as edema noted at this point. This is something that from the edema standpoint has been going on for some time the patient obviously as lymphedema among issues with venous stasis as well. With that being said I do believe at this point that she's having some discomfort at the site fortunately there's no evidence of active infection at this time which is good news. The patient has been having issues as well it sounds like with something completely  separate in regard to pain she's unable to raise her legs and elevate them due to the fact that whenever she does she has a burning pain that goes into her right hip laterally and then runs down into her foot in times as well. This actually appears to be something emanating from her back which I discussed with her today as well I think that's completely separate from the chronic venous stasis and lymphedema which we are going to be managing at this point. No fevers, chills, nausea, or vomiting noted at this time. 07/29/18 on evaluation today patient appears to be doing better in regard to the overall wound size which is good news. With that being said she is still having a significant amount of pain some of this is directly related to be open wound and even to the fact that there might be a little bit of infection at this point. With that being said things are not looking too poorly at this point which is good news. I did obtain a culture today to test this further. With that being said with regard to the orthopedic referral this has been sent they have not heard from the office as of yet. Advised that they do not hear anything by Monday that they give the office a call. 08/05/2018 on evaluation today patient appears to be doing better with regard to her lower extremity ulcer. There is just a very small area that still has any opening remaining at this time which is excellent news. She has been tolerating the compression wrap without complication. I do believe this has been extremely beneficial for her which is excellent news. Again we have been utilizing a 3 layer compression. She was also placed on Cipro most recently I had to change her from doxycycline to Cipro due to the culture findings. I do not  see any evidence of more significant infection compared to last week. 08/12/2018 on evaluation today patient appears to be doing very well with regard to her right lower extremity ulcer. She has been  tolerating the dressing changes without complication. Fortunately there is no signs of infection at this time. Overall I am very pleased with the progress she is made the wound is definitely measuring smaller today compared to last visit. She did see Okey RegalCarol who is Dr. E[facility] [place]. Nonetheless she did feel like the patient was having some component of back pain causing her leg pain and recommended physical therapy to start with. Pending how things progress they may consider injections in the future but really do not want to do that until her wound is healed the good news is the wound seems to be progressing quite nicely. 08/19/18 on evaluation today patient appears to be doing quite well with regard to her wound currently. She has been tolerating the dressing changes without complication. Fortunately there's no signs of active infection at this time. She has not started physical therapy as of yet. Electronic Signature(s) Signed: 08/20/2018 2:28:02 AM By: Lenda KelpStone III, Hoyt PA-C Entered By: Lenda KelpStone III, Hoyt on 08/20/2018 02:15:10 Erin [name], Erin [name]. ([id number]) -------------------------------------------------------------------------------- Physical Exam Details Patient Name: Erin [name], Erin [name]. Date of Service: 08/19/2018 12:45 PM Medical Record Number: [id number] Patient Account Number: [id number] Date of Birth/Sex: 08/12/1945 (73 y.o. F) Treating RN: Curtis Sitesorthy, Joanna Primary Care Provider: PATIENT, NO Other Clinician: Referring Provider: Referral, Self Treating Provider/Extender: STONE III, HOYT Weeks in Treatment: 4 Constitutional Well-nourished and well-hydrated in no acute distress. Respiratory normal breathing without difficulty. clear to auscultation bilaterally. Cardiovascular regular rate and rhythm with normal S1, S2. Psychiatric this patient is able to make decisions and demonstrates good insight into disease process. Alert and  Oriented x 3. pleasant and cooperative. Notes Patient's wound bed currently showed signs of good granulation at this time the does not appear to be evidence of active infection which is great news. She really did not require any sharp debridement at this time which is also good news and overall I feel like she is making good progress. Electronic Signature(s) Signed: 08/20/2018 2:28:02 AM By: Lenda KelpStone III, Hoyt PA-C Entered By: Lenda KelpStone III, Hoyt on 08/20/2018 02:15:45 Erin [name], Nuri [name]. ([id number]) -------------------------------------------------------------------------------- Physician Orders Details Patient Name: Erin [name], Erin [name]. Date of Service: 08/19/2018 12:45 PM Medical Record Number: [id number] Patient Account Number: [id number] Date of Birth/Sex: 11/01/1945 (73 y.o. F) Treating RN: Curtis Sitesorthy, Joanna Primary Care Provider: PATIENT, NO Other Clinician: Referring Provider: Referral, Self Treating Provider/Extender: STONE III, HOYT Weeks in Treatment: 4 Verbal / Phone Orders: No Diagnosis Coding Wound Cleansing Wound #1 Right,Lateral Lower Leg o Clean wound with Normal Saline. o May shower with protection. - Please do not get your wrap wet Anesthetic (add to Medication List) Wound #1 Right,Lateral Lower Leg o Topical Lidocaine 4% cream applied to wound bed prior to debridement (In Clinic Only). Primary Wound Dressing Wound #1 Right,Lateral Lower Leg o Silver Collagen Secondary Dressing Wound #1 Right,Lateral Lower Leg o ABD pad Dressing Change Frequency Wound #1 Right,Lateral Lower Leg o Change dressing every week Follow-up Appointments o Return Appointment in 1 week. Edema Control Wound #1 Right,Lateral Lower Leg o 3 Layer Compression System - Right Lower Extremity Electronic Signature(s) Signed: 08/19/2018 4:41:14 PM By: Curtis Sitesorthy, Joanna Signed: 08/20/2018 2:28:02 AM By: Lenda KelpStone III, Hoyt PA-C Entered By: Curtis Sitesorthy, Joanna on 08/19/2018 13:13:35 Stockley, Tyger [name].  ([id number]) --------------------------------------------------------------------------------  Problem List Details Patient Name: Erin [name], Erin [name]. Date of Service: 08/19/2018 12:45 PM Medical Record Number: [id number] Patient Account Number: [id number] Date of Birth/Sex: 05/09/1945 (73 y.o. F) Treating RN: Curtis Sitesorthy, Joanna Primary Care Provider: PATIENT, NO Other Clinician: Referring Provider: Referral, Self Treating Provider/Extender: Linwood DibblesSTONE III, HOYT Weeks in Treatment: 4 Active Problems ICD-10 Evaluated Encounter Code Description Active Date Today Diagnosis I87.2 Venous insufficiency (chronic) (peripheral) 07/22/2018 No Yes I89.0 Lymphedema, not elsewhere classified 07/24/2018 No Yes L97.812 Non-pressure chronic ulcer of other part of right lower leg 07/22/2018 No Yes with fat layer exposed N18.3 Chronic kidney disease, stage 3 (moderate) 07/22/2018 No Yes I50.42 Chronic combined systolic (congestive) and diastolic 07/22/2018 No Yes (congestive) heart failure I10 Essential (primary) hypertension 07/22/2018 No Yes I48.0 Paroxysmal atrial fibrillation 07/22/2018 No Yes Inactive Problems Resolved Problems Electronic Signature(s) Signed: 08/20/2018 2:28:02 AM By: Lenda KelpStone III, Hoyt PA-C Entered By: Lenda KelpStone III, Hoyt on 08/20/2018 01:03:39 Erin [name], Sindy [name]. ([id number]) -------------------------------------------------------------------------------- Progress Note Details Patient Name: Erin [name], Erin [name]. Date of Service: 08/19/2018 12:45 PM Medical Record Number: [id number] Patient Account Number: [id number] Date of Birth/Sex: 03/27/1945 (73 y.o. F) Treating RN: Curtis Sitesorthy, Joanna Primary Care Provider: PATIENT, NO Other Clinician: Referring Provider: Referral, Self Treating Provider/Extender: Linwood DibblesSTONE III, HOYT Weeks in Treatment: 4 Subjective Chief Complaint Information obtained from Patient Right LE Ulcer History of Present Illness (HPI) 07/22/18 patient presents today for initial evaluation  regarding issues that she is having with wounds on the right lateral lower leg. The good news is that these actually appear to be showing signs of already feeling which is good news. There still a lot of maceration and drainage as well as edema noted at this point. This is something that from the edema standpoint has been going on for some time the patient obviously as lymphedema among issues with venous stasis as well. With that being said I do believe at this point that she's having some discomfort at the site fortunately there's no evidence of active infection at this time which is good news. The patient has been having issues as well it sounds like with something completely separate in regard to pain she's unable to raise her legs and elevate them due to the fact that whenever she does she has a burning pain that goes into her right hip laterally and then runs down into her foot in times as well. This actually appears to be something emanating from her back which I discussed with her today as well I think that's completely separate from the chronic venous stasis and lymphedema which we are going to be managing at this point. No fevers, chills, nausea, or vomiting noted at this time. 07/29/18 on evaluation today patient appears to be doing better in regard to the overall wound size which is good news. With that being said she is still having a significant amount of pain some of this is directly related to be open wound and even to the fact that there might be a little bit of infection at this point. With that being said things are not looking too poorly at this point which is good news. I did obtain a culture today to test this further. With that being said with regard to the orthopedic referral this has been sent they have not heard from the office as of yet. Advised that they do not hear anything by Monday that they give the office a call. 08/05/2018 on evaluation today patient appears to be  doing better with regard to her lower  extremity ulcer. There is just a very small area that still has any opening remaining at this time which is excellent news. She has been tolerating the compression wrap without complication. I do believe this has been extremely beneficial for her which is excellent news. Again we have been utilizing a 3 layer compression. She was also placed on Cipro most recently I had to change her from doxycycline to Cipro due to the culture findings. I do not see any evidence of more significant infection compared to last week. 08/12/2018 on evaluation today patient appears to be doing very well with regard to her right lower extremity ulcer. She has been tolerating the dressing changes without complication. Fortunately there is no signs of infection at this time. Overall I am very pleased with the progress she is made the wound is definitely measuring smaller today compared to last visit. She did see Okey Regal who is Dr. Ethelene Hal PA at [facility] which is now [facility] in [place]. Nonetheless she did feel like the patient was having some component of back pain causing her leg pain and recommended physical therapy to start with. Pending how things progress they may consider injections in the future but really do not want to do that until her wound is healed the good news is the wound seems to be progressing quite nicely. 08/19/18 on evaluation today patient appears to be doing quite well with regard to her wound currently. She has been tolerating the dressing changes without complication. Fortunately there's no signs of active infection at this time. She has not started physical therapy as of yet. Patient History Information obtained from Patient. ELECTRA, MANOOGIAN ([id number]) Family History Cancer - Siblings, Diabetes - Father,Siblings, Heart Disease - Mother,Siblings, Hypertension - Mother, Lung Disease - Siblings, No family history of Hereditary  Spherocytosis, Kidney Disease, Seizures, Stroke, Thyroid Problems, Tuberculosis. Social History Never smoker, Marital Status - Single, Alcohol Use - Never, Drug Use - No History, Caffeine Use - Never. Medical History Eyes Denies history of Cataracts, Glaucoma, Optic Neuritis Ear/Nose/Mouth/Throat Denies history of Chronic sinus problems/congestion, Middle ear problems Hematologic/Lymphatic Denies history of Anemia, Hemophilia, Human Immunodeficiency Virus, Lymphedema, Sickle Cell Disease Respiratory Denies history of Aspiration, Asthma, Chronic Obstructive Pulmonary Disease (COPD), Pneumothorax, Sleep Apnea, Tuberculosis Cardiovascular Patient has history of Congestive Heart Failure, Coronary Artery Disease, Hypertension Denies history of Angina, Arrhythmia, Deep Vein Thrombosis, Hypotension, Myocardial Infarction, Peripheral Arterial Disease, Peripheral Venous Disease, Phlebitis, Vasculitis Gastrointestinal Denies history of Cirrhosis , Colitis, Crohn s, Hepatitis A, Hepatitis B, Hepatitis C Endocrine Denies history of Type I Diabetes, Type II Diabetes Genitourinary Denies history of End Stage Renal Disease Immunological Denies history of Lupus Erythematosus, Raynaud s, Scleroderma Integumentary (Skin) Denies history of History of Burn, History of pressure wounds Musculoskeletal Denies history of Gout, Rheumatoid Arthritis, Osteoarthritis, Osteomyelitis Neurologic Denies history of Dementia, Neuropathy, Quadriplegia, Paraplegia, Seizure Disorder Oncologic Denies history of Received Chemotherapy, Received Radiation Psychiatric Denies history of Anorexia/bulimia, Confinement Anxiety Review of Systems (ROS) Constitutional Symptoms (General Health) Denies complaints or symptoms of Fatigue, Fever, Chills, Marked Weight Change. Cardiovascular Complains or has symptoms of LE edema. Denies complaints or symptoms of Chest pain. Psychiatric Denies complaints or symptoms of Anxiety,  Claustrophobia. Objective HILDEGARDE, EMPEY. ([id number]) Constitutional Well-nourished and well-hydrated in no acute distress. Vitals Time Taken: 12:50 PM, Height: 60 in, Weight: 213 lbs, BMI: 41.6, Temperature: 98.5 F, Pulse: 82 bpm, Respiratory Rate: 20 breaths/min, Blood Pressure: 132/54 mmHg. Respiratory normal breathing without difficulty. clear to auscultation bilaterally. Cardiovascular regular  rate and rhythm with normal S1, S2. Psychiatric this patient is able to make decisions and demonstrates good insight into disease process. Alert and Oriented x 3. pleasant and cooperative. General Notes: Patient's wound bed currently showed signs of good granulation at this time the does not appear to be evidence of active infection which is great news. She really did not require any sharp debridement at this time which is also good news and overall I feel like she is making good progress. Integumentary (Hair, Skin) Wound #1 status is Open. Original cause of wound was Blister. The wound is located on the Right,Lateral Lower Leg. The wound measures 1cm length x 2.1cm width x 0.1cm depth; 1.649cm^2 area and 0.165cm^3 volume. There is Fat Layer (Subcutaneous Tissue) Exposed exposed. There is no tunneling or undermining noted. There is a medium amount of serous drainage noted. The wound margin is flat and intact. There is medium (34-66%) pink granulation within the wound bed. There is a medium (34-66%) amount of necrotic tissue within the wound bed including Eschar and Adherent Slough. Assessment Active Problems ICD-10 Venous insufficiency (chronic) (peripheral) Lymphedema, not elsewhere classified Non-pressure chronic ulcer of other part of right lower leg with fat layer exposed Chronic kidney disease, stage 3 (moderate) Chronic combined systolic (congestive) and diastolic (congestive) heart failure Essential (primary) hypertension Paroxysmal atrial fibrillation Procedures Wound  #1 Pre-procedure diagnosis of Wound #1 is a Venous Leg Ulcer located on the Right,Lateral Lower Leg . There was a Three Layer Compression Therapy Procedure with a pre-treatment ABI of 1 by Montey Hora, RN. Post procedure Diagnosis Wound #1: Same as Pre-Procedure ANEYAH, LORTZ. ([id number]) Plan Wound Cleansing: Wound #1 Right,Lateral Lower Leg: Clean wound with Normal Saline. May shower with protection. - Please do not get your wrap wet Anesthetic (add to Medication List): Wound #1 Right,Lateral Lower Leg: Topical Lidocaine 4% cream applied to wound bed prior to debridement (In Clinic Only). Primary Wound Dressing: Wound #1 Right,Lateral Lower Leg: Silver Collagen Secondary Dressing: Wound #1 Right,Lateral Lower Leg: ABD pad Dressing Change Frequency: Wound #1 Right,Lateral Lower Leg: Change dressing every week Follow-up Appointments: Return Appointment in 1 week. Edema Control: Wound #1 Right,Lateral Lower Leg: 3 Layer Compression System - Right Lower Extremity 1. With regard to patient's wound I do think that at this time she is a candidate for [place] to go ahead and continue with the collagen dressing as this seems to be doing well for her. 2. With regard to the compression wrap them in a recommend also that we continue with three layer compression wrap since she seems to be doing well with this. 3. Also recommended she continue to elevate her legs as much as possible as this will also help with the swelling. Please see above for specific wound care orders. We will see patient for re-evaluation in 2 week(s) here in the clinic. If anything worsens or changes patient will contact our office for additional recommendations. Electronic Signature(s) Signed: 08/20/2018 2:28:02 AM By: Worthy Keeler PA-C Entered By: Worthy Keeler on 08/20/2018 02:16:38 AERABELLA, GALASSO ([id number]) -------------------------------------------------------------------------------- ROS/PFSH  Details Patient Name: GRAYLEE, ARUTYUNYAN. Date of Service: 08/19/2018 12:45 PM Medical Record Number: [id number] Patient Account Number: [id number] Date of Birth/Sex: 1945/08/18 (73 y.o. F) Treating RN: Montey Hora Primary Care Provider: PATIENT, NO Other Clinician: Referring Provider: Referral, Self Treating Provider/Extender: STONE III, HOYT Weeks in Treatment: 4 Information Obtained From Patient Constitutional Symptoms (General Health) Complaints and Symptoms: Negative for: Fatigue; Fever; Chills; Marked Weight Change Cardiovascular  Complaints and Symptoms: Positive for: LE edema Negative for: Chest pain Medical History: Positive for: Congestive Heart Failure; Coronary Artery Disease; Hypertension Negative for: Angina; Arrhythmia; Deep Vein Thrombosis; Hypotension; Myocardial Infarction; Peripheral Arterial Disease; Peripheral Venous Disease; Phlebitis; Vasculitis Psychiatric Complaints and Symptoms: Negative for: Anxiety; Claustrophobia Medical History: Negative for: Anorexia/bulimia; Confinement Anxiety Eyes Medical History: Negative for: Cataracts; Glaucoma; Optic Neuritis Ear/Nose/Mouth/Throat Medical History: Negative for: Chronic sinus problems/congestion; Middle ear problems Hematologic/Lymphatic Medical History: Negative for: Anemia; Hemophilia; Human Immunodeficiency Virus; Lymphedema; Sickle Cell Disease Respiratory Medical History: Negative for: Aspiration; Asthma; Chronic Obstructive Pulmonary Disease (COPD); Pneumothorax; Sleep Apnea; Tuberculosis Gastrointestinal LAKIYA, COTTAM ([id number]) Medical History: Negative for: Cirrhosis ; Colitis; Crohnos; Hepatitis A; Hepatitis B; Hepatitis C Endocrine Medical History: Negative for: Type I Diabetes; Type II Diabetes Genitourinary Medical History: Negative for: End Stage Renal Disease Immunological Medical History: Negative for: Lupus Erythematosus; Raynaudos; Scleroderma Integumentary (Skin) Medical  History: Negative for: History of Burn; History of pressure wounds Musculoskeletal Medical History: Negative for: Gout; Rheumatoid Arthritis; Osteoarthritis; Osteomyelitis Neurologic Medical History: Negative for: Dementia; Neuropathy; Quadriplegia; Paraplegia; Seizure Disorder Oncologic Medical History: Negative for: Received Chemotherapy; Received Radiation Immunizations Pneumococcal Vaccine: Received Pneumococcal Vaccination: No Implantable Devices Yes Family and Social History Cancer: Yes - Siblings; Diabetes: Yes - Father,Siblings; Heart Disease: Yes - Mother,Siblings; Hereditary Spherocytosis: No; Hypertension: Yes - Mother; Kidney Disease: No; Lung Disease: Yes - Siblings; Seizures: No; Stroke: No; Thyroid Problems: No; Tuberculosis: No; Never smoker; Marital Status - Single; Alcohol Use: Never; Drug Use: No History; Caffeine Use: Never; Financial Concerns: No; Food, Clothing or Shelter Needs: No; Support System Lacking: No; Transportation Concerns: No Physician Affirmation I have reviewed and agree with the above information. Electronic Signature(s) Signed: 08/20/2018 2:28:02 AM By: Lenda Kelp PA-C Signed: 08/23/2018 5:30:55 PM By: Livingston Diones ([id number]) Entered By: Lenda Kelp on 08/20/2018 02:15:31 RANETTE, LUCKADOO ([id number]) -------------------------------------------------------------------------------- SuperBill Details Patient Name: CHRISTEENA, KROGH. Date of Service: 08/19/2018 Medical Record Number: [id number] Patient Account Number: [id number] Date of Birth/Sex: 25-May-1945 (73 y.o. F) Treating RN: Curtis Sites Primary Care Provider: PATIENT, NO Other Clinician: Referring Provider: Referral, Self Treating Provider/Extender: STONE III, HOYT Weeks in Treatment: 4 Diagnosis Coding ICD-10 Codes Code Description I87.2 Venous insufficiency (chronic) (peripheral) I89.0 Lymphedema, not elsewhere classified L97.812 Non-pressure  chronic ulcer of other part of right lower leg with fat layer exposed N18.3 Chronic kidney disease, stage 3 (moderate) I50.42 Chronic combined systolic (congestive) and diastolic (congestive) heart failure I10 Essential (primary) hypertension I48.0 Paroxysmal atrial fibrillation Facility Procedures CPT4 Code Description: [id number] (Facility Use Only) [phone] - APPLY MULTLAY COMPRS LWR RT LEG Modifier: Quantity: 1 Physician Procedures CPT4 Code Description: [id number] - WC PHYS LEVEL 4 - EST PT ICD-10 Diagnosis Description I87.2 Venous insufficiency (chronic) (peripheral) I89.0 Lymphedema, not elsewhere classified 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

## 2018-08-26 ENCOUNTER — Other Ambulatory Visit: Payer: Self-pay

## 2018-08-26 ENCOUNTER — Encounter: Payer: Medicare Other | Admitting: Physician Assistant

## 2018-08-26 DIAGNOSIS — L97812 Non-pressure chronic ulcer of other part of right lower leg with fat layer exposed: Secondary | ICD-10-CM | POA: Diagnosis not present

## 2018-08-26 NOTE — Progress Notes (Signed)
FLODA, GONALEZ ([id number]) Visit Report for 08/26/2018 Arrival Information Details Patient Name: Erin [name], Erin [name]. Date of Service: 08/26/2018 3:30 PM Medical Record Number: [id number] Patient Account Number: [id number] Date of Birth/Sex: [birth year] (73 y.o. F) Treating RN: Rodell Perna Primary Care Shantella Blubaugh: PATIENT, NO Other Clinician: Referring Viviano Bir: Referral, Self Treating Sherri Levenhagen/Extender: STONE III, HOYT Weeks in Treatment: 5 Visit Information History Since Last Visit Added or deleted any medications: No Patient Arrived: [place] Any new allergies or adverse reactions: No Arrival Time: 15:41 Had a fall or experienced change in No Accompanied By: daughter activities of daily living that may affect Transfer Assistance: None risk of falls: Signs or symptoms of abuse/neglect since last visito No Hospitalized since last visit: No Has Dressing in Place as Prescribed: Yes Pain Present Now: No Electronic Signature(s) Signed: 08/26/2018 4:20:53 PM By: Rodell Perna Entered By: Rodell Perna on 08/26/2018 15:41:54 Osmon, Erin Pouch ([id number]) -------------------------------------------------------------------------------- Lower Extremity Assessment Details Patient Name: Erin [name]. Date of Service: 08/26/2018 3:30 PM Medical Record Number: [id number] Patient Account Number: [id number] Date of Birth/Sex: May 12, 1945 (73 y.o. F) Treating RN: Rodell Perna Primary Care Ramesh Moan: PATIENT, NO Other Clinician: Referring Kemiah Booz: Referral, Self Treating Braxley Balandran/Extender: STONE III, HOYT Weeks in Treatment: 5 Edema Assessment Assessed: [Left: No] [Right: No] Edema: [Left: No] [Right: No] Calf Left: Right: Point of Measurement: 30 cm From Medial Instep cm 31 cm Ankle Left: Right: Point of Measurement: 9 cm From Medial Instep cm 22 cm Vascular Assessment Pulses: Dorsalis Pedis Palpable: [Left:Yes] [Right:Yes] Electronic Signature(s) Signed: 08/26/2018 4:20:53 PM By:  Rodell Perna Entered By: Rodell Perna on 08/26/2018 15:46:53 Erin [name], [name] ([id number]) -------------------------------------------------------------------------------- Multi Wound Chart Details Patient Name: Erin [name]. Date of Service: 08/26/2018 3:30 PM Medical Record Number: [id number] Patient Account Number: [id number] Date of Birth/Sex: [birth year] (73 y.o. F) Treating RN: Curtis Sites Primary Care Abshir Paolini: PATIENT, NO Other Clinician: Referring Khadejah Son: Referral, Self Treating Kennedie Pardoe/Extender: STONE III, HOYT Weeks in Treatment: 5 Vital Signs Height(in): 60 Pulse(bpm): 92 Weight(lbs): 213 Blood Pressure(mmHg): 155/85 Body Mass Index(BMI): 42 Temperature(F): 98.8 Respiratory Rate 16 (breaths/min): Photos: Wound Location: Right Lower Leg - Lateral Left Lower Leg - Lateral Left Lower Leg - Lateral, Proximal Wounding Event: Blister Trauma Trauma Primary Etiology: Venous Leg Ulcer Trauma, Other Trauma, Other Comorbid History: Congestive Heart Failure, Congestive Heart Failure, Congestive Heart Failure, Coronary Artery Disease, Coronary Artery Disease, Coronary Artery Disease, Hypertension Hypertension Hypertension Date Acquired: 07/01/2018 08/21/2018 08/21/2018 Weeks of Treatment: 5 0 0 Wound Status: Open Open Open Measurements L x W x D 0.5x0.5x0.1 5.5x6.5x0.1 1.5x1.5x0.1 (cm) Area (cm) : 0.196 28.078 1.767 Volume (cm) : 0.02 2.808 0.177 % Reduction in Area: 99.80% N/A N/A % Reduction in Volume: 99.80% N/A N/A Classification: Partial Thickness Partial Thickness Partial Thickness Exudate Amount: Medium Medium Medium Exudate Type: Serous Serosanguineous Serosanguineous Exudate Color: amber red, brown red, brown Wound Margin: Flat and Intact N/A N/A Granulation Amount: Medium (34-66%) Medium (34-66%) Large (67-100%) Granulation Quality: Pink Red Red Necrotic Amount: Medium (34-66%) Medium (34-66%) Small (1-33%) Necrotic Tissue: Eschar, Adherent Slough  Eschar, Adherent Slough N/A Exposed Structures: Fat Layer (Subcutaneous Fat Layer (Subcutaneous Fat Layer (Subcutaneous Tissue) Exposed: Yes Tissue) Exposed: Yes Tissue) Exposed: Yes Fascia: No Fascia: No Fascia: No Tendon: No Tendon: No Tendon: No Liaw, [name] ([id number]) Muscle: No Muscle: No Muscle: No Joint: No Joint: No Joint: No Bone: No Bone: No Bone: No Epithelialization: Medium (34-66%) None None Treatment Notes Electronic Signature(s) Signed: 08/26/2018 4:48:17 PM By: Curtis Sites Entered By: Curtis Sites on  08/26/2018 15:59:09 ALEXYA, MCDARIS ([id number]) -------------------------------------------------------------------------------- [place] Details Patient Name: Erin [name], Erin [name]. Date of Service: 08/26/2018 3:30 PM Medical Record Number: [id number] Patient Account Number: [id number] Date of Birth/Sex: 1945-04-07 (72 y.o. F) Treating RN: Montey Hora Primary Care Daymon Hora: PATIENT, NO Other Clinician: Referring Lexee Brashears: Referral, Self Treating Itzamara Casas/Extender: STONE III, HOYT Weeks in Treatment: 5 Active Inactive Abuse / Safety / Falls / Self Care Management Nursing Diagnoses: Potential for falls Goals: Patient will remain injury free related to falls Date Initiated: 07/22/2018 Target Resolution Date: 11/04/2018 Goal Status: Active Interventions: Assess fall risk on admission and as needed Notes: Orientation to the Wound Care Program Nursing Diagnoses: Knowledge deficit related to the wound healing center program Goals: Patient/caregiver will verbalize understanding of the [facility] Date Initiated: 07/22/2018 Target Resolution Date: 11/04/2018 Goal Status: Active Interventions: Provide education on orientation to the wound center Notes: Venous Leg Ulcer Nursing Diagnoses: Potential for venous Insuffiency (use before diagnosis confirmed) Goals: Patient will maintain optimal edema  control Date Initiated: 07/22/2018 Target Resolution Date: 11/04/2018 Goal Status: Active Interventions: Compression as ordered RAND, BOLLER ([id number]) Notes: Wound/Skin Impairment Nursing Diagnoses: Impaired tissue integrity Goals: Ulcer/skin breakdown will heal within 14 weeks Date Initiated: 07/22/2018 Target Resolution Date: 11/04/2018 Goal Status: Active Interventions: Assess patient/caregiver ability to obtain necessary supplies Assess patient/caregiver ability to perform ulcer/skin care regimen upon admission and as needed Assess ulceration(s) every visit Notes: Electronic Signature(s) Signed: 08/26/2018 4:48:17 PM By: Montey Hora Entered By: Montey Hora on 08/26/2018 15:58:58 Durkee, Placido Sou ([id number]) -------------------------------------------------------------------------------- Pain Assessment Details Patient Name: Erin [name]. Date of Service: 08/26/2018 3:30 PM Medical Record Number: [id number] Patient Account Number: [id number] Date of Birth/Sex: 1945-06-22 (73 y.o. F) Treating RN: Army Melia Primary Care Alizzon Dioguardi: PATIENT, NO Other Clinician: Referring Deangleo Passage: Referral, Self Treating Devin Foskey/Extender: STONE III, HOYT Weeks in Treatment: 5 Active Problems Location of Pain Severity and Description of Pain Patient Has Paino No Site Locations Pain Management and Medication Current Pain Management: Electronic Signature(s) Signed: 08/26/2018 4:20:53 PM By: Army Melia Entered By: Army Melia on 08/26/2018 15:42:09 Hesch, Pauletta M. ([id number]) -------------------------------------------------------------------------------- Wound Assessment Details Patient Name: Erin [name]. Date of Service: 08/26/2018 3:30 PM Medical Record Number: [id number] Patient Account Number: [id number] Date of Birth/Sex: 1945-05-26 (73 y.o. F) Treating RN: Army Melia Primary Care Tekeshia Klahr: PATIENT, NO Other Clinician: Referring Gurtej Noyola: Referral,  Self Treating Arial Galligan/Extender: STONE III, HOYT Weeks in Treatment: 5 Wound Status Wound Number: 1 Primary Venous Leg Ulcer Etiology: Wound Location: Right Lower Leg - Lateral Wound Status: Open Wounding Event: Blister Comorbid Congestive Heart Failure, Coronary Artery Date Acquired: 07/01/2018 History: Disease, Hypertension Weeks Of Treatment: 5 Clustered Wound: No Photos Wound Measurements Length: (cm) 0.5 % Reduction Width: (cm) 0.5 % Reduction Depth: (cm) 0.1 Epithelializ Area: (cm) 0.196 Tunneling: Volume: (cm) 0.02 Undermining in Area: 99.8% in Volume: 99.8% ation: Medium (34-66%) No : No Wound Description Classification: Partial Thickness Foul Odor Af Wound Margin: Flat and Intact Slough/Fibri Exudate Amount: Medium Exudate Type: Serous Exudate Color: amber ter Cleansing: No no Yes Wound Bed Granulation Amount: Medium (34-66%) Exposed Structure Granulation Quality: Pink Fascia Exposed: No Necrotic Amount: Medium (34-66%) Fat Layer (Subcutaneous Tissue) Exposed: Yes Necrotic Quality: Eschar, Adherent Slough Tendon Exposed: No Muscle Exposed: No Joint Exposed: No Bone Exposed: No Electronic Signature(s) Erin [name], Erin [name] ([id number]) Signed: 08/26/2018 4:20:53 PM By: Army Melia Entered By: Army Melia on 08/26/2018 15:43:42 Stetler, Sun M. ([id number]) -------------------------------------------------------------------------------- Wound Assessment Details Patient Name: Erin [name]. Date of  Service: 08/26/2018 3:30 PM Medical Record Number: [id number] Patient Account Number: [id number] Date of Birth/Sex: 03/19/1945 [age] y.o. F) Treating RN: Rodell PernaScott, Dajea Primary Care Erique Kaser: PATIENT, NO Other Clinician: Referring Macio Kissoon: Referral, Self Treating Kalden Wanke/Extender: STONE III, HOYT Weeks in Treatment: 5 Wound Status Wound Number: 2 Primary Trauma, Other Etiology: Wound Location: Left Lower Leg - Lateral Wound Status: Open Wounding Event:  Trauma Comorbid Congestive Heart Failure, Coronary Artery Date Acquired: 08/21/2018 History: Disease, Hypertension Weeks Of Treatment: 0 Clustered Wound: No Photos Wound Measurements Length: (cm) 5.5 % Reduction Width: (cm) 6.5 % Reduction Depth: (cm) 0.1 Epitheliali Area: (cm) 28.078 Tunneling: Volume: (cm) 2.808 Underminin in Area: in Volume: zation: None No g: No Wound Description Classification: Partial Thickness Foul Odor A Exudate Amount: Medium Slough/Fibr Exudate Type: Serosanguineous Exudate Color: red, brown fter Cleansing: No ino Yes Wound Bed Granulation Amount: Medium (34-66%) Exposed Structure Granulation Quality: Red Fascia Exposed: No Necrotic Amount: Medium (34-66%) Fat Layer (Subcutaneous Tissue) Exposed: Yes Necrotic Quality: Eschar, Adherent Slough Tendon Exposed: No Muscle Exposed: No Joint Exposed: No Bone Exposed: No Electronic Signature(s) Signed: 08/26/2018 4:20:53 PM By: Tyson DenseScott, Dajea Sedita, Erin PouchJULIA M. ([id number]) Entered By: Rodell PernaScott, Dajea on 08/26/2018 15:45:11 Sherr, Erin PouchJULIA M. ([id number]) -------------------------------------------------------------------------------- Wound Assessment Details Patient Name: Erin BowensHAMILTON, [name] Date of Service: 08/26/2018 3:30 PM Medical Record Number: [id number] Patient Account Number: [id number] Date of Birth/Sex: 10/24/1945 (73 y.o. F) Treating RN: Rodell PernaScott, Dajea Primary Care Dewayne Jurek: PATIENT, NO Other Clinician: Referring Nas Wafer: Referral, Self Treating Jhonatan Lomeli/Extender: STONE III, HOYT Weeks in Treatment: 5 Wound Status Wound Number: 3 Primary Trauma, Other Etiology: Wound Location: Left Lower Leg - Lateral, Proximal Wound Status: Open Wounding Event: Trauma Comorbid Congestive Heart Failure, Coronary Artery Date Acquired: 08/21/2018 History: Disease, Hypertension Weeks Of Treatment: 0 Clustered Wound: No Photos Wound Measurements Length: (cm) 1.5 Width: (cm) 1.5 Depth: (cm) 0.1 Area:  (cm) 1.767 Volume: (cm) 0.177 % Reduction in Area: % Reduction in Volume: Epithelialization: None Tunneling: No Undermining: No Wound Description Classification: Partial Thickness Exudate Amount: Medium Exudate Type: Serosanguineous Exudate Color: red, brown Foul Odor After Cleansing: No Slough/Fibrino Yes Wound Bed Granulation Amount: Large (67-100%) Exposed Structure Granulation Quality: Red Fascia Exposed: No Necrotic Amount: Small (1-33%) Fat Layer (Subcutaneous Tissue) Exposed: Yes Tendon Exposed: No Muscle Exposed: No Joint Exposed: No Bone Exposed: No Electronic Signature(s) Signed: 08/26/2018 4:20:53 PM By: Wayland DenisScott, Dajea Erin [name], [name] ([id number]) Entered By: Rodell PernaScott, Dajea on 08/26/2018 15:46:25 Erin [name], Erin PouchJULIA M. ([id number]) -------------------------------------------------------------------------------- Vitals Details Patient Name: Erin BowensHAMILTON, [name] Date of Service: 08/26/2018 3:30 PM Medical Record Number: [id number] Patient Account Number: [id number] Date of Birth/Sex: 02/27/1945 (73 y.o. F) Treating RN: Rodell PernaScott, Dajea Primary Care Timaya Bojarski: PATIENT, NO Other Clinician: Referring Chi Woodham: Referral, Self Treating Kennan Detter/Extender: STONE III, HOYT Weeks in Treatment: 5 Vital Signs Time Taken: 15:40 Temperature (F): 98.8 Height (in): 60 Pulse (bpm): 92 Weight (lbs): 213 Respiratory Rate (breaths/min): 16 Body Mass Index (BMI): 41.6 Blood Pressure (mmHg): 155/85 Reference Range: 80 - 120 mg / dl Electronic Signature(s) Signed: 08/26/2018 4:20:53 PM By: Rodell PernaScott, Dajea Entered By: Rodell PernaScott, Dajea on 08/26/2018 15:42:30

## 2018-08-26 NOTE — Progress Notes (Addendum)
LYNA, LANINGHAM ([id number]) Visit Report for 08/26/2018 Chief Complaint Document Details Patient Name: Erin [name], Erin [name]. Date of Service: 08/26/2018 3:30 PM Medical Record Number: [id number] Patient Account Number: [id number] Date of Birth/Sex: 1945/07/11 (73 y.o. F) Treating RN: Curtis Sites Primary Care Provider: PATIENT, NO Other Clinician: Referring Provider: Referral, Self Treating Provider/Extender: Linwood Dibbles, HOYT Weeks in Treatment: 5 Information Obtained from: Patient Chief Complaint Right LE Ulcer Electronic Signature(s) Signed: 08/26/2018 3:55:28 PM By: Lenda Kelp PA-C Entered By: Lenda Kelp on 08/26/2018 15:55:28 Erin [name], Erin [name] ([id number]) -------------------------------------------------------------------------------- Debridement Details Patient Name: Erin [name]. Date of Service: 08/26/2018 3:30 PM Medical Record Number: [id number] Patient Account Number: [id number] Date of Birth/Sex: November 29, 1945 (73 y.o. F) Treating RN: Curtis Sites Primary Care Provider: PATIENT, NO Other Clinician: Referring Provider: Referral, Self Treating Provider/Extender: STONE III, HOYT Weeks in Treatment: 5 Debridement Performed for Wound #1 Right,Lateral Lower Leg Assessment: Performed By: Physician STONE III, HOYT E., PA-C Debridement Type: Debridement Severity of Tissue Pre Fat layer exposed Debridement: Level of Consciousness (Pre- Awake and Alert procedure): Pre-procedure Verification/Time Yes - 16:04 Out Taken: Start Time: 16:04 Pain Control: Lidocaine 4% Topical Solution Total Area Debrided (L x W): 0.5 (cm) x 0.5 (cm) = 0.25 (cm) Tissue and other material Viable, Non-Viable, Slough, Subcutaneous, Slough, Other: dressing material debrided: Level: Skin/Subcutaneous Tissue Debridement Description: Excisional Instrument: Curette Bleeding: Minimum Hemostasis Achieved: Pressure End Time: 16:06 Procedural Pain: 0 Post Procedural Pain: 0 Response to  Treatment: Procedure was tolerated well Level of Consciousness Awake and Alert (Post-procedure): Post Debridement Measurements of Total Wound Length: (cm) 0.5 Width: (cm) 0.5 Depth: (cm) 0.2 Volume: (cm) 0.039 Character of Wound/Ulcer Post Debridement: Improved Severity of Tissue Post Debridement: Fat layer exposed Post Procedure Diagnosis Same as Pre-procedure Electronic Signature(s) Signed: 08/26/2018 4:48:17 PM By: Curtis Sites Signed: 08/26/2018 4:53:08 PM By: Lenda Kelp PA-C Entered By: Curtis Sites on 08/26/2018 16:06:39 Erin [name], Erin [name] ([id number]) -------------------------------------------------------------------------------- HPI Details Patient Name: Erin [name]. Date of Service: 08/26/2018 3:30 PM Medical Record Number: [id number] Patient Account Number: [id number] Date of Birth/Sex: 1945-11-08 (73 y.o. F) Treating RN: Curtis Sites Primary Care Provider: PATIENT, NO Other Clinician: Referring Provider: Referral, Self Treating Provider/Extender: Linwood Dibbles, HOYT Weeks in Treatment: 5 History of Present Illness HPI Description: 07/22/18 patient presents today for initial evaluation regarding issues that she is having with wounds on the right lateral lower leg. The good news is that these actually appear to be showing signs of already feeling which is good news. There still a lot of maceration and drainage as well as edema noted at this point. This is something that from the edema standpoint has been going on for some time the patient obviously as lymphedema among issues with venous stasis as well. With that being said I do believe at this point that she's having some discomfort at the site fortunately there's no evidence of active infection at this time which is good news. The patient has been having issues as well it sounds like with something completely separate in regard to pain she's unable to raise her legs and elevate them due to the fact that whenever  she does she has a burning pain that goes into her right hip laterally and then runs down into her foot in times as well. This actually appears to be something emanating from her back which I discussed with her today as well I think that's completely separate from the chronic venous stasis and lymphedema which we are going to  be managing at this point. No fevers, chills, nausea, or vomiting noted at this time. 07/29/18 on evaluation today patient appears to be doing better in regard to the overall wound size which is good news. With that being said she is still having a significant amount of pain some of this is directly related to be open wound and even to the fact that there might be a little bit of infection at this point. With that being said things are not looking too poorly at this point which is good news. I did obtain a culture today to test this further. With that being said with regard to the orthopedic referral this has been sent they have not heard from the office as of yet. Advised that they do not hear anything by Monday that they give the office a call. 08/05/2018 on evaluation today patient appears to be doing better with regard to her lower extremity ulcer. There is just a very small area that still has any opening remaining at this time which is excellent news. She has been tolerating the compression wrap without complication. I do believe this has been extremely beneficial for her which is excellent news. Again we have been utilizing a 3 layer compression. She was also placed on Cipro most recently I had to change her from doxycycline to Cipro due to the culture findings. I do not see any evidence of more significant infection compared to last week. 08/12/2018 on evaluation today patient appears to be doing very well with regard to her right lower extremity ulcer. She has been tolerating the dressing changes without complication. Fortunately there is no signs of infection at this  time. Overall I am very pleased with the progress she is made the wound is definitely measuring smaller today compared to last visit. She did see Okey Regal who is Dr. Ethelene Hal PA at [facility] which is now [facility] in [place]. Nonetheless she did feel like the patient was having some component of back pain causing her leg pain and recommended physical therapy to start with. Pending how things progress they may consider injections in the future but really do not want to do that until her wound is healed the good news is the wound seems to be progressing quite nicely. 08/19/18 on evaluation today patient appears to be doing quite well with regard to her wound currently. She has been tolerating the dressing changes without complication. Fortunately there's no signs of active infection at this time. She has not started physical therapy as of yet. 08/26/2018 on evaluation today patient actually appears to be doing okay in regard to her right lower extremity ulcer although it does seem somewhat dry. In regard to left lower extremity she actually has a new blister area unfortunately that we are going to have to address today fortunately this does not appear to be too deep but unfortunately is can be something that we have to address as well from the standpoint of getting everything to heal. There is a little bit of erythema noted as well I may want to place her back on a short course of oral antibiotics. Electronic Signature(s) Signed: 08/26/2018 4:12:36 PM By: Buren Kos Anthony, Kandy M. ([id number]) Entered By: Lenda Kelp on 08/26/2018 16:12:35 Erin [name], Erin [name] ([id number]) -------------------------------------------------------------------------------- Physical Exam Details Patient Name: Erin [name], Erin [name]. Date of Service: 08/26/2018 3:30 PM Medical Record Number: [id number] Patient Account Number: [id number] Date of Birth/Sex: 10-24-1945 (73 y.o. F) Treating RN: Curtis Sites Primary Care  Provider: PATIENT, NO Other Clinician: Referring Provider: Referral, Self Treating Provider/Extender: STONE III, HOYT Weeks in Treatment: 5 Constitutional Well-nourished and well-hydrated in no acute distress. Respiratory normal breathing without difficulty. clear to auscultation bilaterally. Cardiovascular regular rate and rhythm with normal S1, S2. Psychiatric this patient is able to make decisions and demonstrates good insight into disease process. Alert and Oriented x 3. pleasant and cooperative. Notes Patient's wounds currently on the right lower extremity appear to be doing well in fact there is one area and is somewhat dry with some of the Prisma stuck to the wound bed. I am concerned about there not really being enough moisture here for this to regress appropriately. On the left lower extremity we have the opposite issue where she has a significant amount of edema noted causing blistering and weeping. This is good to be an issue as far as keeping the swelling under control we need to do that coupled with getting the blister areas to dry up. I believe we can likely do this with just good compression wrapping. Electronic Signature(s) Signed: 08/26/2018 4:13:23 PM By: Lenda Kelp PA-C Entered By: Lenda Kelp on 08/26/2018 16:13:22 Erin [name], Erin [name] ([id number]) -------------------------------------------------------------------------------- Physician Orders Details Patient Name: Erin [name], Erin [name]. Date of Service: 08/26/2018 3:30 PM Medical Record Number: [id number] Patient Account Number: [id number] Date of Birth/Sex: [birth year] (73 y.o. F) Treating RN: Curtis Sites Primary Care Provider: PATIENT, NO Other Clinician: Referring Provider: Referral, Self Treating Provider/Extender: STONE III, HOYT Weeks in Treatment: 5 Verbal / Phone Orders: No Diagnosis Coding ICD-10 Coding Code Description I87.2 Venous insufficiency (chronic) (peripheral) I89.0  Lymphedema, not elsewhere classified L97.812 Non-pressure chronic ulcer of other part of right lower leg with fat layer exposed N18.3 Chronic kidney disease, stage 3 (moderate) I50.42 Chronic combined systolic (congestive) and diastolic (congestive) heart failure I10 Essential (primary) hypertension I48.0 Paroxysmal atrial fibrillation Wound Cleansing Wound #1 Right,Lateral Lower Leg o Clean wound with Normal Saline. o May shower with protection. - Please do not get your wrap wet Wound #2 Left,Lateral Lower Leg o Clean wound with Normal Saline. o May shower with protection. - Please do not get your wrap wet Wound #3 Left,Proximal,Lateral Lower Leg o Clean wound with Normal Saline. o May shower with protection. - Please do not get your wrap wet Anesthetic (add to Medication List) Wound #1 Right,Lateral Lower Leg o Topical Lidocaine 4% cream applied to wound bed prior to debridement (In Clinic Only). Wound #2 Left,Lateral Lower Leg o Topical Lidocaine 4% cream applied to wound bed prior to debridement (In Clinic Only). Wound #3 Left,Proximal,Lateral Lower Leg o Topical Lidocaine 4% cream applied to wound bed prior to debridement (In Clinic Only). Primary Wound Dressing Wound #1 Right,Lateral Lower Leg o Xeroform Wound #2 Left,Lateral Lower Leg o XtraSorb Wound #3 Left,Proximal,Lateral Lower Leg o Erin [name], Erin [name]. ([id number]) Secondary Dressing Wound #1 Right,Lateral Lower Leg o ABD pad Wound #2 Left,Lateral Lower Leg o ABD pad Wound #3 Left,Proximal,Lateral Lower Leg o ABD pad Dressing Change Frequency Wound #1 Right,Lateral Lower Leg o Change dressing every week Wound #2 Left,Lateral Lower Leg o Change dressing every week Wound #3 Left,Proximal,Lateral Lower Leg o Change dressing every week Follow-up Appointments o Return Appointment in 1 week. Edema Control Wound #1 Right,Lateral Lower Leg o 3 Layer Compression System  - Bilateral Wound #2 Left,Lateral Lower Leg o 3 Layer Compression System - Bilateral Wound #3 Left,Proximal,Lateral Lower Leg o 3 Layer Compression System - Bilateral Patient Medications Allergies: No Known Drug Allergies  Notifications Medication Indication Start End Cipro 08/26/2018 DOSE 1 - oral 500 mg tablet - 1 tablet oral taken 2 times a day for 7 days Electronic Signature(s) Signed: 08/26/2018 4:15:49 PM By: Worthy Keeler PA-C Entered By: Worthy Keeler on 08/26/2018 16:15:49 Ress, Placido Sou ([id number]) -------------------------------------------------------------------------------- Problem List Details Patient Name: [name]. Date of Service: 08/26/2018 3:30 PM Medical Record Number: [id number] Patient Account Number: [id number] Date of Birth/Sex: 1945/08/17 (73 y.o. F) Treating RN: Montey Hora Primary Care Provider: PATIENT, NO Other Clinician: Referring Provider: Referral, Self Treating Provider/Extender: Melburn Hake, HOYT Weeks in Treatment: 5 Active Problems ICD-10 Evaluated Encounter Code Description Active Date Today Diagnosis I87.2 Venous insufficiency (chronic) (peripheral) 07/22/2018 No Yes I89.0 Lymphedema, not elsewhere classified 07/24/2018 No Yes L97.812 Non-pressure chronic ulcer of other part of right lower leg 07/22/2018 No Yes with fat layer exposed L97.821 Non-pressure chronic ulcer of other part of left lower leg 08/26/2018 No Yes limited to breakdown of skin N18.3 Chronic kidney disease, stage 3 (moderate) 07/22/2018 No Yes I50.42 Chronic combined systolic (congestive) and diastolic 04/22/4079 No Yes (congestive) heart failure I10 Essential (primary) hypertension 07/22/2018 No Yes I48.0 Paroxysmal atrial fibrillation 07/22/2018 No Yes Inactive Problems Resolved Problems Electronic Signature(s) JAYCI, ELLEFSON ([id number]) Signed: 08/26/2018 4:12:14 PM By: Worthy Keeler PA-C Previous Signature: 08/26/2018 3:55:17 PM Version By: Worthy Keeler PA-C Entered By: Worthy Keeler on 08/26/2018 16:12:14 Rueckert, Placido Sou ([id number]) -------------------------------------------------------------------------------- Progress Note Details Patient Name: [name]. Date of Service: 08/26/2018 3:30 PM Medical Record Number: [id number] Patient Account Number: [id number] Date of Birth/Sex: 04-Jul-1945 (73 y.o. F) Treating RN: Montey Hora Primary Care Provider: PATIENT, NO Other Clinician: Referring Provider: Referral, Self Treating Provider/Extender: STONE III, HOYT Weeks in Treatment: 5 Subjective Chief Complaint Information obtained from Patient Right LE Ulcer History of Present Illness (HPI) 07/22/18 patient presents today for initial evaluation regarding issues that she is having with wounds on the right lateral lower leg. The good news is that these actually appear to be showing signs of already feeling which is good news. There still a lot of maceration and drainage as well as edema noted at this point. This is something that from the edema standpoint has been going on for some time the patient obviously as lymphedema among issues with venous stasis as well. With that being said I do believe at this point that she's having some discomfort at the site fortunately there's no evidence of active infection at this time which is good news. The patient has been having issues as well it sounds like with something completely separate in regard to pain she's unable to raise her legs and elevate them due to the fact that whenever she does she has a burning pain that goes into her right hip laterally and then runs down into her foot in times as well. This actually appears to be something emanating from her back which I discussed with her today as well I think that's completely separate from the chronic venous stasis and lymphedema which we are going to be managing at this point. No fevers, chills, nausea, or vomiting noted at  this time. 07/29/18 on evaluation today patient appears to be doing better in regard to the overall wound size which is good news. With that being said she is still having a significant amount of pain some of this is directly related to be open wound and even to the fact that there might be a little bit of infection at  this point. With that being said things are not looking too poorly at this point which is good news. I did obtain a culture today to test this further. With that being said with regard to the orthopedic referral this has been sent they have not heard from the office as of yet. Advised that they do not hear anything by Monday that they give the office a call. 08/05/2018 on evaluation today patient appears to be doing better with regard to her lower extremity ulcer. There is just a very small area that still has any opening remaining at this time which is excellent news. She has been tolerating the compression wrap without complication. I do believe this has been extremely beneficial for her which is excellent news. Again we have been utilizing a 3 layer compression. She was also placed on Cipro most recently I had to change her from doxycycline to Cipro due to the culture findings. I do not see any evidence of more significant infection compared to last week. 08/12/2018 on evaluation today patient appears to be doing very well with regard to her right lower extremity ulcer. She has been tolerating the dressing changes without complication. Fortunately there is no signs of infection at this time. Overall I am very pleased with the progress she is made the wound is definitely measuring smaller today compared to last visit. She did see Okey Regal who is Dr. Ethelene Hal PA at [facility] which is now [facility] in [place]. Nonetheless she did feel like the patient was having some component of back pain causing her leg pain and recommended physical therapy to start with. Pending how  things progress they may consider injections in the future but really do not want to do that until her wound is healed the good news is the wound seems to be progressing quite nicely. 08/19/18 on evaluation today patient appears to be doing quite well with regard to her wound currently. She has been tolerating the dressing changes without complication. Fortunately there's no signs of active infection at this time. She has not started physical therapy as of yet. 08/26/2018 on evaluation today patient actually appears to be doing okay in regard to her right lower extremity ulcer although it does seem somewhat dry. In regard to left lower extremity she actually has a new blister area unfortunately that we are going to have to address today fortunately this does not appear to be too deep but unfortunately is can be something that we have to address as well from the standpoint of getting everything to heal. There is a little bit of erythema noted as well I may want Erin [name], Erin [name]. ([id number]) to place her back on a short course of oral antibiotics. Patient History Information obtained from Patient. Family History Cancer - Siblings, Diabetes - Father,Siblings, Heart Disease - Mother,Siblings, Hypertension - Mother, Lung Disease - Siblings, No family history of Hereditary Spherocytosis, Kidney Disease, Seizures, Stroke, Thyroid Problems, Tuberculosis. Social History Never smoker, Marital Status - Single, Alcohol Use - Never, Drug Use - No History, Caffeine Use - Never. Medical History Eyes Denies history of Cataracts, Glaucoma, Optic Neuritis Ear/Nose/Mouth/Throat Denies history of Chronic sinus problems/congestion, Middle ear problems Hematologic/Lymphatic Denies history of Anemia, Hemophilia, Human Immunodeficiency Virus, Lymphedema, Sickle Cell Disease Respiratory Denies history of Aspiration, Asthma, Chronic Obstructive Pulmonary Disease (COPD), Pneumothorax, Sleep  Apnea, Tuberculosis Cardiovascular Patient has history of Congestive Heart Failure, Coronary Artery Disease, Hypertension Denies history of Angina, Arrhythmia, Deep Vein Thrombosis, Hypotension, Myocardial Infarction, Peripheral Arterial Disease,  Peripheral Venous Disease, Phlebitis, Vasculitis Gastrointestinal Denies history of Cirrhosis , Colitis, Crohn s, Hepatitis A, Hepatitis B, Hepatitis C Endocrine Denies history of Type I Diabetes, Type II Diabetes Genitourinary Denies history of End Stage Renal Disease Immunological Denies history of Lupus Erythematosus, Raynaud s, Scleroderma Integumentary (Skin) Denies history of History of Burn, History of pressure wounds Musculoskeletal Denies history of Gout, Rheumatoid Arthritis, Osteoarthritis, Osteomyelitis Neurologic Denies history of Dementia, Neuropathy, Quadriplegia, Paraplegia, Seizure Disorder Oncologic Denies history of Received Chemotherapy, Received Radiation Psychiatric Denies history of Anorexia/bulimia, Confinement Anxiety Review of Systems (ROS) Constitutional Symptoms (General Health) Denies complaints or symptoms of Fatigue, Fever, Chills, Marked Weight Change. Respiratory Denies complaints or symptoms of Chronic or frequent coughs, Shortness of Breath. Cardiovascular Complains or has symptoms of LE edema. Denies complaints or symptoms of Chest pain. Psychiatric Denies complaints or symptoms of Anxiety, Claustrophobia. Erin [name], Erin [name] .. ([id number]) Objective Constitutional Well-nourished and well-hydrated in no acute distress. Vitals Time Taken: 3:40 PM, Height: 60 in, Weight: 213 lbs, BMI: 41.6, Temperature: 98.8 F, Pulse: 92 bpm, Respiratory Rate: 16 breaths/min, Blood Pressure: 155/85 mmHg. Respiratory normal breathing without difficulty. clear to auscultation bilaterally. Cardiovascular regular rate and rhythm with normal S1, S2. Psychiatric this patient is able to make decisions and demonstrates  good insight into disease process. Alert and Oriented x 3. pleasant and cooperative. General Notes: Patient's wounds currently on the right lower extremity appear to be doing well in fact there is one area and is somewhat dry with some of the Prisma stuck to the wound bed. I am concerned about there not really being enough moisture here for this to regress appropriately. On the left lower extremity we have the opposite issue where she has a significant amount of edema noted causing blistering and weeping. This is good to be an issue as far as keeping the swelling under control we need to do that coupled with getting the blister areas to dry up. I believe we can likely do this with just good compression wrapping. Integumentary (Hair, Skin) Wound #1 status is Open. Original cause of wound was Blister. The wound is located on the Right,Lateral Lower Leg. The wound measures 0.5cm length x 0.5cm width x 0.1cm depth; 0.196cm^2 area and 0.02cm^3 volume. There is Fat Layer (Subcutaneous Tissue) Exposed exposed. There is no tunneling or undermining noted. There is a medium amount of serous drainage noted. The wound margin is flat and intact. There is medium (34-66%) pink granulation within the wound bed. There is a medium (34-66%) amount of necrotic tissue within the wound bed including Eschar and Adherent Slough. Wound #2 status is Open. Original cause of wound was Trauma. The wound is located on the Left,Lateral Lower Leg. The wound measures 5.5cm length x 6.5cm width x 0.1cm depth; 28.078cm^2 area and 2.808cm^3 volume. There is Fat Layer (Subcutaneous Tissue) Exposed exposed. There is no tunneling or undermining noted. There is a medium amount of serosanguineous drainage noted. There is medium (34-66%) red granulation within the wound bed. There is a medium (34- 66%) amount of necrotic tissue within the wound bed including Eschar and Adherent Slough. Wound #3 status is Open. Original cause of wound was  Trauma. The wound is located on the Left,Proximal,Lateral Lower Leg. The wound measures 1.5cm length x 1.5cm width x 0.1cm depth; 1.767cm^2 area and 0.177cm^3 volume. There is Fat Layer (Subcutaneous Tissue) Exposed exposed. There is no tunneling or undermining noted. There is a medium amount of serosanguineous drainage noted. There is large (67-100%) red granulation within  the wound bed. There is a small (1-33%) amount of necrotic tissue within the wound bed. Assessment Erin [name], Shaia M. ([id number]) Active Problems ICD-10 Venous insufficiency (chronic) (peripheral) Lymphedema, not elsewhere classified Non-pressure chronic ulcer of other part of right lower leg with fat layer exposed Non-pressure chronic ulcer of other part of left lower leg limited to breakdown of skin Chronic kidney disease, stage 3 (moderate) Chronic combined systolic (congestive) and diastolic (congestive) heart failure Essential (primary) hypertension Paroxysmal atrial fibrillation Procedures Wound #1 Pre-procedure diagnosis of Wound #1 is a Venous Leg Ulcer located on the Right,Lateral Lower Leg .Severity of Tissue Pre Debridement is: Fat layer exposed. There was a Excisional Skin/Subcutaneous Tissue Debridement with a total area of 0.25 sq cm performed by STONE III, HOYT E., PA-C. With the following instrument(s): Curette to remove Viable and Non-Viable tissue/material. Material removed includes Subcutaneous Tissue, Slough, and Other: dressing material after achieving pain control using Lidocaine 4% Topical Solution. No specimens were taken. A time out was conducted at 16:04, prior to the start of the procedure. A Minimum amount of bleeding was controlled with Pressure. The procedure was tolerated well with a pain level of 0 throughout and a pain level of 0 following the procedure. Post Debridement Measurements: 0.5cm length x 0.5cm width x 0.2cm depth; 0.039cm^3 volume. Character of Wound/Ulcer Post Debridement is  improved. Severity of Tissue Post Debridement is: Fat layer exposed. Post procedure Diagnosis Wound #1: Same as Pre-Procedure Plan Wound Cleansing: Wound #1 Right,Lateral Lower Leg: Clean wound with Normal Saline. May shower with protection. - Please do not get your wrap wet Wound #2 Left,Lateral Lower Leg: Clean wound with Normal Saline. May shower with protection. - Please do not get your wrap wet Wound #3 Left,Proximal,Lateral Lower Leg: Clean wound with Normal Saline. May shower with protection. - Please do not get your wrap wet Anesthetic (add to Medication List): Wound #1 Right,Lateral Lower Leg: Topical Lidocaine 4% cream applied to wound bed prior to debridement (In Clinic Only). Wound #2 Left,Lateral Lower Leg: Topical Lidocaine 4% cream applied to wound bed prior to debridement (In Clinic Only). Wound #3 Left,Proximal,Lateral Lower Leg: Topical Lidocaine 4% cream applied to wound bed prior to debridement (In Clinic Only). Primary Wound Dressing: Wound #1 Right,Lateral Lower Leg: Xeroform Erin [name], Sherrita M. ([id number]) Wound #2 Left,Lateral Lower Leg: XtraSorb Wound #3 Left,Proximal,Lateral Lower Leg: XtraSorb Secondary Dressing: Wound #1 Right,Lateral Lower Leg: ABD pad Wound #2 Left,Lateral Lower Leg: ABD pad Wound #3 Left,Proximal,Lateral Lower Leg: ABD pad Dressing Change Frequency: Wound #1 Right,Lateral Lower Leg: Change dressing every week Wound #2 Left,Lateral Lower Leg: Change dressing every week Wound #3 Left,Proximal,Lateral Lower Leg: Change dressing every week Follow-up Appointments: Return Appointment in 1 week. Edema Control: Wound #1 Right,Lateral Lower Leg: 3 Layer Compression System - Bilateral Wound #2 Left,Lateral Lower Leg: 3 Layer Compression System - Bilateral Wound #3 Left,Proximal,Lateral Lower Leg: 3 Layer Compression System - Bilateral The following medication(s) was prescribed: Cipro oral 500 mg tablet 1 1 tablet oral taken 2  times a day for 7 days starting 08/26/2018 1. I would recommend that we go ahead and initiate a 3 layer compression wrap on the left lower extremity as well as continuing this on the right. She is in agreement with this plan. 2. With regard to the right lower extremity we will get you Xeroform over this wound is to provide a little bit of moisture so hopefully will heal more appropriately. 3. With regard to the left lower extremity we can actually  utilize the nonstick contact layer that allows drainage through it to the left lower extremity blister area and then subsequently will use extra sorb over top of this. 4. I would recommend that the patient continue to elevate her legs as much as possible to prevent any excess edema. 5. I am going to go ahead and send in a prescription for Cipro for the patient just for 7 days We will see patient back for reevaluation in 1 week here in the clinic. If anything worsens or changes patient will contact our office for additional recommendations. Electronic Signature(s) Signed: 08/26/2018 4:16:13 PM By: Lenda KelpStone III, Hoyt PA-C Entered By: Lenda KelpStone III, Hoyt on 08/26/2018 16:16:13 Maines, Erin PouchJULIA M. ([id number]) -------------------------------------------------------------------------------- ROS/PFSH Details Patient Name: Erin [name], Cheyann M. Date of Service: 08/26/2018 3:30 PM Medical Record Number: [id number] Patient Account Number: [id number] Date of Birth/Sex: 11/30/1945 (73 y.o. F) Treating RN: Curtis Sitesorthy, Joanna Primary Care Provider: PATIENT, NO Other Clinician: Referring Provider: Referral, Self Treating Provider/Extender: STONE III, HOYT Weeks in Treatment: 5 Information Obtained From Patient Constitutional Symptoms (General Health) Complaints and Symptoms: Negative for: Fatigue; Fever; Chills; Marked Weight Change Respiratory Complaints and Symptoms: Negative for: Chronic or frequent coughs; Shortness of Breath Medical History: Negative for: Aspiration;  Asthma; Chronic Obstructive Pulmonary Disease (COPD); Pneumothorax; Sleep Apnea; Tuberculosis Cardiovascular Complaints and Symptoms: Positive for: LE edema Negative for: Chest pain Medical History: Positive for: Congestive Heart Failure; Coronary Artery Disease; Hypertension Negative for: Angina; Arrhythmia; Deep Vein Thrombosis; Hypotension; Myocardial Infarction; Peripheral Arterial Disease; Peripheral Venous Disease; Phlebitis; Vasculitis Psychiatric Complaints and Symptoms: Negative for: Anxiety; Claustrophobia Medical History: Negative for: Anorexia/bulimia; Confinement Anxiety Eyes Medical History: Negative for: Cataracts; Glaucoma; Optic Neuritis Ear/Nose/Mouth/Throat Medical History: Negative for: Chronic sinus problems/congestion; Middle ear problems Hematologic/Lymphatic Medical History: Negative for: Anemia; Hemophilia; Human Immunodeficiency Virus; Lymphedema; Sickle Cell Disease Erin [name], Luise M. ([id number]) Gastrointestinal Medical History: Negative for: Cirrhosis ; Colitis; Crohnos; Hepatitis A; Hepatitis B; Hepatitis C Endocrine Medical History: Negative for: Type I Diabetes; Type II Diabetes Genitourinary Medical History: Negative for: End Stage Renal Disease Immunological Medical History: Negative for: Lupus Erythematosus; Raynaudos; Scleroderma Integumentary (Skin) Medical History: Negative for: History of Burn; History of pressure wounds Musculoskeletal Medical History: Negative for: Gout; Rheumatoid Arthritis; Osteoarthritis; Osteomyelitis Neurologic Medical History: Negative for: Dementia; Neuropathy; Quadriplegia; Paraplegia; Seizure Disorder Oncologic Medical History: Negative for: Received Chemotherapy; Received Radiation Immunizations Pneumococcal Vaccine: Received Pneumococcal Vaccination: No Implantable Devices Yes Family and Social History Cancer: Yes - Siblings; Diabetes: Yes - Father,Siblings; Heart Disease: Yes -  Mother,Siblings; Hereditary Spherocytosis: No; Hypertension: Yes - Mother; Kidney Disease: No; Lung Disease: Yes - Siblings; Seizures: No; Stroke: No; Thyroid Problems: No; Tuberculosis: No; Never smoker; Marital Status - Single; Alcohol Use: Never; Drug Use: No History; Caffeine Use: Never; Financial Concerns: No; Food, Clothing or Shelter Needs: No; Support System Lacking: No; Transportation Concerns: No Physician Affirmation I have reviewed and agree with the above information. Electronic Signature(s) Erin [name], Jetty M. ([id number]) Signed: 08/26/2018 4:48:17 PM By: Curtis Sitesorthy, Joanna Signed: 08/26/2018 4:53:08 PM By: Lenda KelpStone III, Hoyt PA-C Entered By: Lenda KelpStone III, Hoyt on 08/26/2018 16:12:53 Erin [name], Khara M. ([id number]) -------------------------------------------------------------------------------- SuperBill Details Patient Name: Erin [name], Ryenn M. Date of Service: 08/26/2018 Medical Record Number: [id number] Patient Account Number: [id number] Date of Birth/Sex: 05/14/1945 (73 y.o. 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 N18.3 Chronic kidney disease, stage 3 (moderate) I50.42 Chronic combined systolic (congestive) and diastolic (congestive) heart failure I10 Essential (primary) hypertension I48.0 Paroxysmal atrial fibrillation Facility Procedures CPT4 Code Description: 16109604 11042 - DEB SUBQ TISSUE 20 SQ CM/< ICD-10 Diagnosis Description L97.812 Non-pressure chronic ulcer of other part of right lower leg wi Modifier: th  fat layer expo Quantity: 1 sed Physician Procedures CPT4 Code Description: 5409811 91478 - WC PHYS LEVEL 4 - EST PT ICD-10 Diagnosis Description I87.2 Venous insufficiency (chronic) (peripheral) I89.0 Lymphedema, not elsewhere classified L97.812 Non-pressure chronic ulcer of other part of right lower leg  wit L97.821 Non-pressure chronic ulcer of other part of left lower leg limi Modifier: 25 h fat layer expos ted to breakdown Quantity: 1 ed of skin CPT4 Code Description: 2956213 11042 - WC PHYS SUBQ TISS 20 SQ CM ICD-10 Diagnosis Description L97.812 Non-pressure chronic ulcer of other part of right lower leg wit Modifier: h fat layer expos Quantity: 1 ed Electronic Signature(s) Signed: 08/26/2018 4:16:35 PM By: Lenda Kelp PA-C Entered By: Lenda Kelp on 08/26/2018 16:16:35

## 2018-08-31 ENCOUNTER — Telehealth: Payer: Self-pay | Admitting: Cardiovascular Disease

## 2018-08-31 NOTE — Telephone Encounter (Signed)
Error

## 2018-09-02 ENCOUNTER — Encounter: Payer: Medicare Other | Admitting: Physician Assistant

## 2018-09-02 ENCOUNTER — Other Ambulatory Visit: Payer: Self-pay

## 2018-09-02 DIAGNOSIS — L97812 Non-pressure chronic ulcer of other part of right lower leg with fat layer exposed: Secondary | ICD-10-CM | POA: Diagnosis not present

## 2018-09-02 NOTE — Progress Notes (Addendum)
[name], [name] ([id number]) Visit Report for 09/02/2018 Chief Complaint Document Details Patient Name: Erin [name], Erin [name]. Date of Service: 09/02/2018 1:45 PM Medical Record Number: [id number] Patient Account Number: [id number] Date of Birth/Sex: 1945/04/11 (73 y.o. F) Treating RN: Montey Hora Primary Care Provider: PATIENT, NO Other Clinician: Referring Provider: Referral, Self Treating Provider/Extender: Melburn Hake, HOYT Weeks in Treatment: 6 Information Obtained from: Patient Chief Complaint Right LE Ulcer Electronic Signature(s) Signed: 09/02/2018 2:31:32 PM By: Worthy Keeler PA-C Entered By: Worthy Keeler on 09/02/2018 14:31:32 Curtiss, Placido Sou ([id number]) -------------------------------------------------------------------------------- HPI Details Patient Name: Erin [name]. Date of Service: 09/02/2018 1:45 PM Medical Record Number: [id number] Patient Account Number: [id number] Date of Birth/Sex: June 16, 1945 (73 y.o. F) Treating RN: Montey Hora Primary Care Provider: PATIENT, NO Other Clinician: Referring Provider: Referral, Self Treating Provider/Extender: Melburn Hake, HOYT Weeks in Treatment: 6 History of Present Illness HPI Description: 07/22/18 patient presents today for initial evaluation regarding issues that she is having with wounds on the right lateral lower leg. The good news is that these actually appear to be showing signs of already feeling which is good news. There still a lot of maceration and drainage as well as edema noted at this point. This is something that from the edema standpoint has been going on for some time the patient obviously as lymphedema among issues with venous stasis as well. With that being said I do believe at this point that she's having some discomfort at the site fortunately there's no evidence of active infection at this time which is good news. The patient has been having issues as well it sounds like with something completely  separate in regard to pain she's unable to raise her legs and elevate them due to the fact that whenever she does she has a burning pain that goes into her right hip laterally and then runs down into her foot in times as well. This actually appears to be something emanating from her back which I discussed with her today as well I think that's completely separate from the chronic venous stasis and lymphedema which we are going to be managing at this point. No fevers, chills, nausea, or vomiting noted at this time. 07/29/18 on evaluation today patient appears to be doing better in regard to the overall wound size which is good news. With that being said she is still having a significant amount of pain some of this is directly related to be open wound and even to the fact that there might be a little bit of infection at this point. With that being said things are not looking too poorly at this point which is good news. I did obtain a culture today to test this further. With that being said with regard to the orthopedic referral this has been sent they have not heard from the office as of yet. Advised that they do not hear anything by Monday that they give the office a call. 08/05/2018 on evaluation today patient appears to be doing better with regard to her lower extremity ulcer. There is just a very small area that still has any opening remaining at this time which is excellent news. She has been tolerating the compression wrap without complication. I do believe this has been extremely beneficial for her which is excellent news. Again we have been utilizing a 3 layer compression. She was also placed on Cipro most recently I had to change her from doxycycline to Cipro due to the culture findings. I do not  see any evidence of more significant infection compared to last week. 08/12/2018 on evaluation today patient appears to be doing very well with regard to her right lower extremity ulcer. She has been  tolerating the dressing changes without complication. Fortunately there is no signs of infection at this time. Overall I am very pleased with the progress she is made the wound is definitely measuring smaller today compared to last visit. She did see Okey Regal who is Dr. Ethelene Hal PA at [facility] which is now [facility] in [place]. Nonetheless she did feel like the patient was having some component of back pain causing her leg pain and recommended physical therapy to start with. Pending how things progress they may consider injections in the future but really do not want to do that until her wound is healed the good news is the wound seems to be progressing quite nicely. 08/19/18 on evaluation today patient appears to be doing quite well with regard to her wound currently. She has been tolerating the dressing changes without complication. Fortunately there's no signs of active infection at this time. She has not started physical therapy as of yet. 08/26/2018 on evaluation today patient actually appears to be doing okay in regard to her right lower extremity ulcer although it does seem somewhat dry. In regard to left lower extremity she actually has a new blister area unfortunately that we are going to have to address today fortunately this does not appear to be too deep but unfortunately is can be something that we have to address as well from the standpoint of getting everything to heal. There is a little bit of erythema noted as well I may want to place her back on a short course of oral antibiotics. 09/02/18 on evaluation today patient appears to be doing better with regard to her ulcers at this point. In fact she is showing signs of improvement at several sites and this is great news. Fortunately there's no signs of active infection at this time. Nonetheless I feel like she is much closer to healing even after a very short time. No fevers, chills, nausea, or vomiting noted at this  time. LYNELLE, WEILER ([id number]) Electronic Signature(s) Signed: 09/04/2018 6:59:38 PM By: Lenda Kelp PA-C Entered By: Lenda Kelp on 09/04/2018 17:09:14 GEORGANNE, SIPLE ([id number]) -------------------------------------------------------------------------------- Physical Exam Details Patient Name: Erin [name], Erin [name]. Date of Service: 09/02/2018 1:45 PM Medical Record Number: [id number] Patient Account Number: [id number] Date of Birth/Sex: 04/26/1945 (73 y.o. F) Treating RN: Curtis Sites Primary Care Provider: PATIENT, NO Other Clinician: Referring Provider: Referral, Self Treating Provider/Extender: STONE III, HOYT Weeks in Treatment: 6 Constitutional Well-nourished and well-hydrated in no acute distress. Respiratory normal breathing without difficulty. clear to auscultation bilaterally. Cardiovascular regular rate and rhythm with normal S1, S2. Psychiatric this patient is able to make decisions and demonstrates good insight into disease process. Alert and Oriented x 3. pleasant and cooperative. Notes Upon inspection patient's wound bed actually showed signs of good granulation at this time at all locations no sharp debridement was necessary today which is great news there does not appear to be any signs of infection. Patient swelling is under fairly good control with the use of the compression wraps. Electronic Signature(s) Signed: 09/04/2018 6:59:38 PM By: Lenda Kelp PA-C Entered By: Lenda Kelp on 09/04/2018 17:10:01 DAJHA, URQUILLA ([id number]) -------------------------------------------------------------------------------- Physician Orders Details Patient Name: ZYAH, GOMM. Date of Service: 09/02/2018 1:45 PM Medical Record Number: [id number] Patient Account Number: [id number] Date of  Birth/Sex: October 29, 1945 (73 y.o. F) Treating RN: Curtis Sites Primary Care Provider: PATIENT, NO Other Clinician: Referring Provider: Referral, Self Treating  Provider/Extender: STONE III, HOYT Weeks in Treatment: 6 Verbal / Phone Orders: No Diagnosis Coding ICD-10 Coding Code Description I87.2 Venous insufficiency (chronic) (peripheral) I89.0 Lymphedema, not elsewhere classified L97.812 Non-pressure chronic ulcer of other part of right lower leg with fat layer exposed L97.821 Non-pressure chronic ulcer of other part of left lower leg limited to breakdown of skin N18.3 Chronic kidney disease, stage 3 (moderate) I50.42 Chronic combined systolic (congestive) and diastolic (congestive) heart failure I10 Essential (primary) hypertension I48.0 Paroxysmal atrial fibrillation Wound Cleansing Wound #1 Right,Lateral Lower Leg o Clean wound with Normal Saline. o May shower with protection. - Please do not get your wrap wet Wound #2 Left,Lateral Lower Leg o Clean wound with Normal Saline. o May shower with protection. - Please do not get your wrap wet Wound #3 Left,Proximal,Lateral Lower Leg o Clean wound with Normal Saline. o May shower with protection. - Please do not get your wrap wet Anesthetic (add to Medication List) Wound #1 Right,Lateral Lower Leg o Topical Lidocaine 4% cream applied to wound bed prior to debridement (In Clinic Only). Wound #2 Left,Lateral Lower Leg o Topical Lidocaine 4% cream applied to wound bed prior to debridement (In Clinic Only). Wound #3 Left,Proximal,Lateral Lower Leg o Topical Lidocaine 4% cream applied to wound bed prior to debridement (In Clinic Only). Primary Wound Dressing Wound #1 Right,Lateral Lower Leg o Xeroform Wound #3 Left,Proximal,Lateral Lower Leg o Xeroform Wound #4 Left,Anterior Lower Leg Desaulniers, Janaisa [name]. ([id number]) o Xeroform Wound #2 Left,Lateral Lower Leg o XtraSorb o Other: - adaptic Secondary Dressing Wound #1 Right,Lateral Lower Leg o ABD pad Wound #3 Left,Proximal,Lateral Lower Leg o ABD pad Wound #4 Left,Anterior Lower Leg o ABD pad Dressing  Change Frequency Wound #1 Right,Lateral Lower Leg o Change dressing every week Wound #2 Left,Lateral Lower Leg o Change dressing every week Wound #3 Left,Proximal,Lateral Lower Leg o Change dressing every week Follow-up Appointments o Return Appointment in 1 week. Edema Control Wound #1 Right,Lateral Lower Leg o 3 Layer Compression System - Bilateral Wound #2 Left,Lateral Lower Leg o 3 Layer Compression System - Bilateral Wound #3 Left,Proximal,Lateral Lower Leg o 3 Layer Compression System - Bilateral Electronic Signature(s) Signed: 09/02/2018 4:02:04 PM By: Curtis Sites Signed: 09/04/2018 6:59:38 PM By: Lenda Kelp PA-C Entered By: Curtis Sites on 09/02/2018 15:06:04 Moeckel, Arlana Pouch ([id number]) -------------------------------------------------------------------------------- Problem List Details Patient Name: Erin [name], Erin [name]. Date of Service: 09/02/2018 1:45 PM Medical Record Number: [id number] Patient Account Number: [id number] Date of Birth/Sex: Jan 11, 1945 (73 y.o. F) Treating RN: Curtis Sites Primary Care Provider: PATIENT, NO Other Clinician: Referring Provider: Referral, Self Treating Provider/Extender: Linwood Dibbles, HOYT Weeks in Treatment: 6 Active Problems ICD-10 Evaluated Encounter Code Description Active Date Today Diagnosis I87.2 Venous insufficiency (chronic) (peripheral) 07/22/2018 No Yes I89.0 Lymphedema, not elsewhere classified 07/24/2018 No Yes L97.812 Non-pressure chronic ulcer of other part of right lower leg 07/22/2018 No Yes with fat layer exposed L97.821 Non-pressure chronic ulcer of other part of left lower leg 08/26/2018 No Yes limited to breakdown of skin N18.3 Chronic kidney disease, stage 3 (moderate) 07/22/2018 No Yes I50.42 Chronic combined systolic (congestive) and diastolic 07/22/2018 No Yes (congestive) heart failure I10 Essential (primary) hypertension 07/22/2018 No Yes I48.0 Paroxysmal atrial fibrillation 07/22/2018 No  Yes Inactive Problems Resolved Problems Electronic Signature(s) SANDIA, PFUND ([id number]) Signed: 09/02/2018 2:31:23 PM By: Lenda Kelp PA-C Entered By: Lenda Kelp  on 09/02/2018 14:31:22 ANNALIE, WENNER ([id number]) -------------------------------------------------------------------------------- Progress Note Details Patient Name: Erin [name], Erin [name]. Date of Service: 09/02/2018 1:45 PM Medical Record Number: [id number] Patient Account Number: [id number] Date of Birth/Sex: 1945/08/11 (73 y.o. F) Treating RN: Curtis Sites Primary Care Provider: PATIENT, NO Other Clinician: Referring Provider: Referral, Self Treating Provider/Extender: Linwood Dibbles, HOYT Weeks in Treatment: 6 Subjective Chief Complaint Information obtained from Patient Right LE Ulcer History of Present Illness (HPI) 07/22/18 patient presents today for initial evaluation regarding issues that she is having with wounds on the right lateral lower leg. The good news is that these actually appear to be showing signs of already feeling which is good news. There still a lot of maceration and drainage as well as edema noted at this point. This is something that from the edema standpoint has been going on for some time the patient obviously as lymphedema among issues with venous stasis as well. With that being said I do believe at this point that she's having some discomfort at the site fortunately there's no evidence of active infection at this time which is good news. The patient has been having issues as well it sounds like with something completely separate in regard to pain she's unable to raise her legs and elevate them due to the fact that whenever she does she has a burning pain that goes into her right hip laterally and then runs down into her foot in times as well. This actually appears to be something emanating from her back which I discussed with her today as well I think that's completely separate from the  chronic venous stasis and lymphedema which we are going to be managing at this point. No fevers, chills, nausea, or vomiting noted at this time. 07/29/18 on evaluation today patient appears to be doing better in regard to the overall wound size which is good news. With that being said she is still having a significant amount of pain some of this is directly related to be open wound and even to the fact that there might be a little bit of infection at this point. With that being said things are not looking too poorly at this point which is good news. I did obtain a culture today to test this further. With that being said with regard to the orthopedic referral this has been sent they have not heard from the office as of yet. Advised that they do not hear anything by Monday that they give the office a call. 08/05/2018 on evaluation today patient appears to be doing better with regard to her lower extremity ulcer. There is just a very small area that still has any opening remaining at this time which is excellent news. She has been tolerating the compression wrap without complication. I do believe this has been extremely beneficial for her which is excellent news. Again we have been utilizing a 3 layer compression. She was also placed on Cipro most recently I had to change her from doxycycline to Cipro due to the culture findings. I do not see any evidence of more significant infection compared to last week. 08/12/2018 on evaluation today patient appears to be doing very well with regard to her right lower extremity ulcer. She has been tolerating the dressing changes without complication. Fortunately there is no signs of infection at this time. Overall I am very pleased with the progress she is made the wound is definitely measuring smaller today compared to last visit. She did see Okey Regal who is  Dr. Ethelene Hal PA at [facility] which is now [facility] in [place]. Nonetheless she did feel like the  patient was having some component of back pain causing her leg pain and recommended physical therapy to start with. Pending how things progress they may consider injections in the future but really do not want to do that until her wound is healed the good news is the wound seems to be progressing quite nicely. 08/19/18 on evaluation today patient appears to be doing quite well with regard to her wound currently. She has been tolerating the dressing changes without complication. Fortunately there's no signs of active infection at this time. She has not started physical therapy as of yet. 08/26/2018 on evaluation today patient actually appears to be doing okay in regard to her right lower extremity ulcer although it does seem somewhat dry. In regard to left lower extremity she actually has a new blister area unfortunately that we are going to have to address today fortunately this does not appear to be too deep but unfortunately is can be something that we have to address as well from the standpoint of getting everything to heal. There is a little bit of erythema noted as well I may want JANANN, CONTOIS. ([id number]) to place her back on a short course of oral antibiotics. 09/02/18 on evaluation today patient appears to be doing better with regard to her ulcers at this point. In fact she is showing signs of improvement at several sites and this is great news. Fortunately there's no signs of active infection at this time. Nonetheless I feel like she is much closer to healing even after a very short time. No fevers, chills, nausea, or vomiting noted at this time. Patient History Information obtained from Patient. Family History Cancer - Siblings, Diabetes - Father,Siblings, Heart Disease - Mother,Siblings, Hypertension - Mother, Lung Disease - Siblings, No family history of Hereditary Spherocytosis, Kidney Disease, Seizures, Stroke, Thyroid Problems, Tuberculosis. Social History Never smoker,  Marital Status - Single, Alcohol Use - Never, Drug Use - No History, Caffeine Use - Never. Medical History Eyes Denies history of Cataracts, Glaucoma, Optic Neuritis Ear/Nose/Mouth/Throat Denies history of Chronic sinus problems/congestion, Middle ear problems Hematologic/Lymphatic Denies history of Anemia, Hemophilia, Human Immunodeficiency Virus, Lymphedema, Sickle Cell Disease Respiratory Denies history of Aspiration, Asthma, Chronic Obstructive Pulmonary Disease (COPD), Pneumothorax, Sleep Apnea, Tuberculosis Cardiovascular Patient has history of Congestive Heart Failure, Coronary Artery Disease, Hypertension Denies history of Angina, Arrhythmia, Deep Vein Thrombosis, Hypotension, Myocardial Infarction, Peripheral Arterial Disease, Peripheral Venous Disease, Phlebitis, Vasculitis Gastrointestinal Denies history of Cirrhosis , Colitis, Crohn s, Hepatitis A, Hepatitis B, Hepatitis C Endocrine Denies history of Type I Diabetes, Type II Diabetes Genitourinary Denies history of End Stage Renal Disease Immunological Denies history of Lupus Erythematosus, Raynaud s, Scleroderma Integumentary (Skin) Denies history of History of Burn, History of pressure wounds Musculoskeletal Denies history of Gout, Rheumatoid Arthritis, Osteoarthritis, Osteomyelitis Neurologic Denies history of Dementia, Neuropathy, Quadriplegia, Paraplegia, Seizure Disorder Oncologic Denies history of Received Chemotherapy, Received Radiation Psychiatric Denies history of Anorexia/bulimia, Confinement Anxiety Review of Systems (ROS) Constitutional Symptoms (General Health) Denies complaints or symptoms of Fatigue, Fever, Chills, Marked Weight Change. Respiratory Denies complaints or symptoms of Chronic or frequent coughs, Shortness of Breath. Cardiovascular Complains or has symptoms of LE edema. Denies complaints or symptoms of Chest pain. JALI, COCKFIELD. ([id number]) Psychiatric Denies complaints or  symptoms of Anxiety, Claustrophobia. Objective Constitutional Well-nourished and well-hydrated in no acute distress. Vitals Time Taken: 2:10 PM, Height: 60 in,  Weight: 213 lbs, BMI: 41.6, Temperature: 98.9 F, Pulse: 98 bpm, Respiratory Rate: 16 breaths/min, Blood Pressure: 105/75 mmHg. Respiratory normal breathing without difficulty. clear to auscultation bilaterally. Cardiovascular regular rate and rhythm with normal S1, S2. Psychiatric this patient is able to make decisions and demonstrates good insight into disease process. Alert and Oriented x 3. pleasant and cooperative. General Notes: Upon inspection patient's wound bed actually showed signs of good granulation at this time at all locations no sharp debridement was necessary today which is great news there does not appear to be any signs of infection. Patient swelling is under fairly good control with the use of the compression wraps. Integumentary (Hair, Skin) Wound #1 status is Open. Original cause of wound was Blister. The wound is located on the Right,Lateral Lower Leg. The wound measures 0.9cm length x 1.2cm width x 0.1cm depth; 0.848cm^2 area and 0.085cm^3 volume. There is Fat Layer (Subcutaneous Tissue) Exposed exposed. There is a medium amount of serous drainage noted. The wound margin is flat and intact. There is medium (34-66%) pink granulation within the wound bed. There is a medium (34-66%) amount of necrotic tissue within the wound bed including Eschar and Adherent Slough. Wound #2 status is Open. Original cause of wound was Trauma. The wound is located on the Left,Lateral Lower Leg. The wound measures 7.5cm length x 8cm width x 0.1cm depth; 47.124cm^2 area and 4.712cm^3 volume. There is Fat Layer (Subcutaneous Tissue) Exposed exposed. There is no tunneling or undermining noted. There is a medium amount of serosanguineous drainage noted. There is medium (34-66%) red granulation within the wound bed. There is a medium  (34- 66%) amount of necrotic tissue within the wound bed including Eschar and Adherent Slough. Wound #3 status is Open. Original cause of wound was Trauma. The wound is located on the Left,Proximal,Lateral Lower Leg. The wound measures 1cm length x 1cm width x 0.1cm depth; 0.785cm^2 area and 0.079cm^3 volume. There is Fat Layer (Subcutaneous Tissue) Exposed exposed. There is a medium amount of serosanguineous drainage noted. There is large (67-100%) red granulation within the wound bed. There is a small (1-33%) amount of necrotic tissue within the wound bed. Wound #4 status is Open. Original cause of wound was Blister. The wound is located on the Left,Anterior Lower Leg. The wound measures 1.2cm length x 1cm width x 0.1cm depth; 0.942cm^2 area and 0.094cm^3 volume. There is Fat Layer (Subcutaneous Tissue) Exposed exposed. There is a medium amount of serosanguineous drainage noted. There is medium (34-66%) red granulation within the wound bed. There is a medium (34-66%) amount of necrotic tissue within the wound bed Scaduto, Kade [name]. ([id number]) including Adherent Slough. Assessment Active Problems ICD-10 Venous insufficiency (chronic) (peripheral) Lymphedema, not elsewhere classified Non-pressure chronic ulcer of other part of right lower leg with fat layer exposed Non-pressure chronic ulcer of other part of left lower leg limited to breakdown of skin Chronic kidney disease, stage 3 (moderate) Chronic combined systolic (congestive) and diastolic (congestive) heart failure Essential (primary) hypertension Paroxysmal atrial fibrillation Procedures Wound #1 Pre-procedure diagnosis of Wound #1 is a Venous Leg Ulcer located on the Right,Lateral Lower Leg . There was a Three Layer Compression Therapy Procedure with a pre-treatment ABI of 1 by Curtis Sitesorthy, Joanna, RN. Post procedure Diagnosis Wound #1: Same as Pre-Procedure Wound #2 Pre-procedure diagnosis of Wound #2 is a Trauma, Other located on  the Left,Lateral Lower Leg . There was a Three Layer Compression Therapy Procedure with a pre-treatment ABI of 1 by Curtis Sitesorthy, Joanna, RN. Post procedure  Diagnosis Wound #2: Same as Pre-Procedure Wound #3 Pre-procedure diagnosis of Wound #3 is a Trauma, Other located on the Left,Proximal,Lateral Lower Leg . There was a Three Layer Compression Therapy Procedure with a pre-treatment ABI of 1 by Curtis Sitesorthy, Joanna, RN. Post procedure Diagnosis Wound #3: Same as Pre-Procedure Wound #4 Pre-procedure diagnosis of Wound #4 is a Venous Leg Ulcer located on the Left,Anterior Lower Leg . There was a Three Layer Compression Therapy Procedure with a pre-treatment ABI of 1 by Curtis Sitesorthy, Joanna, RN. Post procedure Diagnosis Wound #4: Same as Pre-Procedure Plan Wound Cleansing: Wound #1 Right,Lateral Lower Leg: Erin [name], Nazly [name]. ([id number]) Clean wound with Normal Saline. May shower with protection. - Please do not get your wrap wet Wound #2 Left,Lateral Lower Leg: Clean wound with Normal Saline. May shower with protection. - Please do not get your wrap wet Wound #3 Left,Proximal,Lateral Lower Leg: Clean wound with Normal Saline. May shower with protection. - Please do not get your wrap wet Anesthetic (add to Medication List): Wound #1 Right,Lateral Lower Leg: Topical Lidocaine 4% cream applied to wound bed prior to debridement (In Clinic Only). Wound #2 Left,Lateral Lower Leg: Topical Lidocaine 4% cream applied to wound bed prior to debridement (In Clinic Only). Wound #3 Left,Proximal,Lateral Lower Leg: Topical Lidocaine 4% cream applied to wound bed prior to debridement (In Clinic Only). Primary Wound Dressing: Wound #1 Right,Lateral Lower Leg: Xeroform Wound #3 Left,Proximal,Lateral Lower Leg: Xeroform Wound #4 Left,Anterior Lower Leg: Xeroform Wound #2 Left,Lateral Lower Leg: XtraSorb Other: - adaptic Secondary Dressing: Wound #1 Right,Lateral Lower Leg: ABD pad Wound #3 Left,Proximal,Lateral  Lower Leg: ABD pad Wound #4 Left,Anterior Lower Leg: ABD pad Dressing Change Frequency: Wound #1 Right,Lateral Lower Leg: Change dressing every week Wound #2 Left,Lateral Lower Leg: Change dressing every week Wound #3 Left,Proximal,Lateral Lower Leg: Change dressing every week Follow-up Appointments: Return Appointment in 1 week. Edema Control: Wound #1 Right,Lateral Lower Leg: 3 Layer Compression System - Bilateral Wound #2 Left,Lateral Lower Leg: 3 Layer Compression System - Bilateral Wound #3 Left,Proximal,Lateral Lower Leg: 3 Layer Compression System - Bilateral 1. At this time my suggestion is gonna be that we go ahead and continue with the Current wound care measures including Xeroform to the smaller ulcers and to the larger ulcer we will actually utilize [employer] as it's draining quite a bit more and I do not think that the Xeroform is gonna do her much good at this site. 2. With regard to compression in a recommend that we continue with a three layer compression wrap bilaterally as this seems to be beneficial for her as well. Erin [name], Amandalee [name]. ([id number]) 3. I'[name] at also suggested she continued elevate her legs as much as possible in order to help with keeping her edema under better control. Please see above for specific wound care orders. We will see patient for re-evaluation in 1 week(s) here in the clinic. If anything worsens or changes patient will contact our office for additional recommendations. Electronic Signature(s) Signed: 09/04/2018 6:59:38 PM By: Lenda KelpStone III, Hoyt PA-C Entered By: Lenda KelpStone III, Hoyt on 09/04/2018 17:13:33 Erin [name], Semiyah [name]. ([id number]) -------------------------------------------------------------------------------- ROS/PFSH Details Patient Name: Erin [name], Erin [name]. Date of Service: 09/02/2018 1:45 PM Medical Record Number: [id number] Patient Account Number: [id number] Date of Birth/Sex: 03/10/1945 (73 y.o. F) Treating RN: Curtis Sitesorthy, Joanna Primary Care  Provider: PATIENT, NO Other Clinician: Referring Provider: Referral, Self Treating Provider/Extender: STONE III, HOYT Weeks in Treatment: 6 Information Obtained From Patient Constitutional Symptoms (General Health) Complaints and Symptoms: Negative for: Fatigue; Fever;  Chills; Marked Weight Change Respiratory Complaints and Symptoms: Negative for: Chronic or frequent coughs; Shortness of Breath Medical History: Negative for: Aspiration; Asthma; Chronic Obstructive Pulmonary Disease (COPD); Pneumothorax; Sleep Apnea; Tuberculosis Cardiovascular Complaints and Symptoms: Positive for: LE edema Negative for: Chest pain Medical History: Positive for: Congestive Heart Failure; Coronary Artery Disease; Hypertension Negative for: Angina; Arrhythmia; Deep Vein Thrombosis; Hypotension; Myocardial Infarction; Peripheral Arterial Disease; Peripheral Venous Disease; Phlebitis; Vasculitis Psychiatric Complaints and Symptoms: Negative for: Anxiety; Claustrophobia Medical History: Negative for: Anorexia/bulimia; Confinement Anxiety Eyes Medical History: Negative for: Cataracts; Glaucoma; Optic Neuritis Ear/Nose/Mouth/Throat Medical History: Negative for: Chronic sinus problems/congestion; Middle ear problems Hematologic/Lymphatic Medical History: Negative for: Anemia; Hemophilia; Human Immunodeficiency Virus; Lymphedema; Sickle Cell Disease Erin [name], Lynden [name]. ([id number]) Gastrointestinal Medical History: Negative for: Cirrhosis ; Colitis; Crohnos; Hepatitis A; Hepatitis B; Hepatitis C Endocrine Medical History: Negative for: Type I Diabetes; Type II Diabetes Genitourinary Medical History: Negative for: End Stage Renal Disease Immunological Medical History: Negative for: Lupus Erythematosus; Raynaudos; Scleroderma Integumentary (Skin) Medical History: Negative for: History of Burn; History of pressure wounds Musculoskeletal Medical History: Negative for: Gout; Rheumatoid  Arthritis; Osteoarthritis; Osteomyelitis Neurologic Medical History: Negative for: Dementia; Neuropathy; Quadriplegia; Paraplegia; Seizure Disorder Oncologic Medical History: Negative for: Received Chemotherapy; Received Radiation Immunizations Pneumococcal Vaccine: Received Pneumococcal Vaccination: No Implantable Devices Yes Family and Social History Cancer: Yes - Siblings; Diabetes: Yes - Father,Siblings; Heart Disease: Yes - Mother,Siblings; Hereditary Spherocytosis: No; Hypertension: Yes - Mother; Kidney Disease: No; Lung Disease: Yes - Siblings; Seizures: No; Stroke: No; Thyroid Problems: No; Tuberculosis: No; Never smoker; Marital Status - Single; Alcohol Use: Never; Drug Use: No History; Caffeine Use: Never; Financial Concerns: No; Food, Clothing or Shelter Needs: No; Support System Lacking: No; Transportation Concerns: No Physician Affirmation I have reviewed and agree with the above information. Electronic Signature(s) Erin [name], Alvena [name]. ([id number]) Signed: 09/04/2018 6:59:38 PM By: Lenda KelpStone III, Hoyt PA-C Signed: 09/05/2018 3:47:55 PM By: Curtis Sitesorthy, Joanna Entered By: Lenda KelpStone III, Hoyt on 09/04/2018 17:09:30 Erin [name], Pheobe [name]. ([id number]) -------------------------------------------------------------------------------- SuperBill Details Patient Name: Erin [name], Erin [name]. Date of Service: 09/02/2018 Medical Record Number: [id number] Patient Account Number: [id number] Date of Birth/Sex: 01/05/1945 (73 y.o. 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 Physician Procedures CPT4 Code Description: [id number] - WC PHYS LEVEL 4 - EST PT ICD-10 Diagnosis Description I87.2 Venous insufficiency (chronic) (peripheral) I89.0 Lymphedema, not elsewhere classified L97.812 Non-pressure chronic ulcer of other part of right lower leg  wi L97.821 Non-pressure chronic ulcer of other part of left lower leg lim Modifier: th fat layer expos ited to breakdown Quantity: 1 ed of skin Electronic Signature(s) Signed: 09/04/2018 6:59:38 PM By: Lenda KelpStone III, Hoyt PA-C Previous Signature: 09/02/2018 4:02:04 PM Version By: Curtis Sitesorthy, Joanna Entered By: Lenda KelpStone III, Hoyt on 09/04/2018 17:19:17

## 2018-09-02 NOTE — Progress Notes (Signed)
Erin [name], [name] ([id number]) Visit Report for 09/02/2018 Arrival Information Details Patient Name: Erin [name], [name] Date of Service: 09/02/2018 1:45 PM Medical Record Number: [id number] Patient Account Number: [id number] Date of Birth/Sex: 01/05/1945 (73 y.o. F) Treating RN: Rodell PernaScott, Dajea Primary Care Freddy Kinne: PATIENT, NO Other Clinician: Referring Chord Takahashi: Referral, Self Treating Stefanie Hodgens/Extender: STONE III, HOYT Weeks in Treatment: 6 Visit Information History Since Last Visit Added or deleted any medications: No Patient Arrived: Walker Any new allergies or adverse reactions: No Arrival Time: 13:59 Had a fall or experienced change in No Accompanied By: daughter activities of daily living that may affect Transfer Assistance: None risk of falls: Signs or symptoms of abuse/neglect since last visito No Hospitalized since last visit: No Has Dressing in Place as Prescribed: Yes Pain Present Now: No Electronic Signature(s) Signed: 09/02/2018 3:12:16 PM By: Rodell PernaScott, Dajea Entered By: Rodell PernaScott, Dajea on 09/02/2018 13:59:43 Clinard, Janeka M. ([id number]) -------------------------------------------------------------------------------- Compression Therapy Details Patient Name: Erin [name], [name] Date of Service: 09/02/2018 1:45 PM Medical Record Number: [id number] Patient Account Number: [id number] Date of Birth/Sex: 05/15/1945 (73 y.o. F) Treating RN: Curtis Sitesorthy, Joanna Primary Care Jorel Gravlin: PATIENT, NO Other Clinician: Referring Robyn Galati: Referral, Self Treating Carolin Quang/Extender: STONE III, HOYT Weeks in Treatment: 6 Compression Therapy Performed for Wound Assessment: Wound #1 Right,Lateral Lower Leg Performed By: Clinician Curtis Sitesorthy, Joanna, RN Compression Type: Three Layer Pre Treatment ABI: 1 Post Procedure Diagnosis Same as Pre-procedure Electronic Signature(s) Signed: 09/02/2018 4:02:04 PM By: Curtis Sitesorthy, Joanna Entered By: Curtis Sitesorthy, Joanna on 09/02/2018 15:04:52 Waddington, [name]  ([id number]) -------------------------------------------------------------------------------- Compression Therapy Details Patient Name: Erin [name], [name] Date of Service: 09/02/2018 1:45 PM Medical Record Number: [id number] Patient Account Number: [id number] Date of Birth/Sex: 08/05/1945 (73 y.o. F) Treating RN: Curtis Sitesorthy, Joanna Primary Care Cleo Santucci: PATIENT, NO Other Clinician: Referring Iyan Flett: Referral, Self Treating Terell Kincy/Extender: STONE III, HOYT Weeks in Treatment: 6 Compression Therapy Performed for Wound Assessment: Wound #2 Left,Lateral Lower Leg Performed By: Clinician Curtis Sitesorthy, Joanna, RN Compression Type: Three Layer Pre Treatment ABI: 1 Post Procedure Diagnosis Same as Pre-procedure Electronic Signature(s) Signed: 09/02/2018 4:02:04 PM By: Curtis Sitesorthy, Joanna Entered By: Curtis Sitesorthy, Joanna on 09/02/2018 15:04:52 Voight, [name] ([id number]) -------------------------------------------------------------------------------- Compression Therapy Details Patient Name: Erin [name], [name] Date of Service: 09/02/2018 1:45 PM Medical Record Number: [id number] Patient Account Number: [id number] Date of Birth/Sex: 05/01/1945 (73 y.o. F) Treating RN: Curtis Sitesorthy, Joanna Primary Care Asuna Peth: PATIENT, NO Other Clinician: Referring Antoria Lanza: Referral, Self Treating Amaury Kuzel/Extender: STONE III, HOYT Weeks in Treatment: 6 Compression Therapy Performed for Wound Assessment: Wound #3 Left,Proximal,Lateral Lower Leg Performed By: Clinician Curtis Sitesorthy, Joanna, RN Compression Type: Three Layer Pre Treatment ABI: 1 Post Procedure Diagnosis Same as Pre-procedure Electronic Signature(s) Signed: 09/02/2018 4:02:04 PM By: Curtis Sitesorthy, Joanna Entered By: Curtis Sitesorthy, Joanna on 09/02/2018 15:04:52 Aguino, [name] ([id number]) -------------------------------------------------------------------------------- Compression Therapy Details Patient Name: Erin [name], [name] Date of Service: 09/02/2018 1:45 PM Medical Record  Number: [id number] Patient Account Number: [id number] Date of Birth/Sex: 12/26/1945 (73 y.o. F) Treating RN: Curtis Sitesorthy, Joanna Primary Care Edyth Glomb: PATIENT, NO Other Clinician: Referring Lasya Vetter: Referral, Self Treating Samvel Zinn/Extender: STONE III, HOYT Weeks in Treatment: 6 Compression Therapy Performed for Wound Assessment: Wound #4 Left,Anterior Lower Leg Performed By: Clinician Curtis Sitesorthy, Joanna, RN Compression Type: Three Layer Pre Treatment ABI: 1 Post Procedure Diagnosis Same as Pre-procedure Electronic Signature(s) Signed: 09/02/2018 4:02:04 PM By: Curtis Sitesorthy, Joanna Entered By: Curtis Sitesorthy, Joanna on 09/02/2018 15:04:52 Duffin, [name] ([id number]) -------------------------------------------------------------------------------- Encounter Discharge Information Details Patient Name: Erin [name], [name] Date of Service: 09/02/2018 1:45 PM Medical Record Number: [id number] Patient Account Number:  [id number] Date of Birth/Sex: 06/08/1945 83(73 y.o. F) Treating RN: Curtis Sitesorthy, Joanna Primary Care Ronzell Laban: PATIENT, NO Other Clinician: Referring Donnelle Rubey: Referral, Self Treating Donya Hitch/Extender: Linwood DibblesSTONE III, HOYT Weeks in Treatment: 6 Encounter Discharge Information Items Discharge Condition: Stable Ambulatory Status: Wheelchair Discharge Destination: Home Transportation: Private Auto Accompanied By: daughter Schedule Follow-up Appointment: Yes Clinical Summary of Care: Electronic Signature(s) Signed: 09/02/2018 4:02:04 PM By: Curtis Sitesorthy, Joanna Entered By: Curtis Sitesorthy, Joanna on 09/02/2018 15:10:21 Erin [name], [name] ([id number]) -------------------------------------------------------------------------------- Lower Extremity Assessment Details Patient Name: Erin [name], Daniela M. Date of Service: 09/02/2018 1:45 PM Medical Record Number: [id number] Patient Account Number: [id number] Date of Birth/Sex: 02/06/1945 (73 y.o. F) Treating RN: Rodell PernaScott, Dajea Primary Care Brynnlee Cumpian: PATIENT, NO Other  Clinician: Referring Braedan Meuth: Referral, Self Treating Skyah Hannon/Extender: STONE III, HOYT Weeks in Treatment: 6 Edema Assessment Assessed: [Left: No] [Right: No] Edema: [Left: No] [Right: No] Calf Left: Right: Point of Measurement: 30 cm From Medial Instep 31 cm 31 cm Ankle Left: Right: Point of Measurement: 9 cm From Medial Instep 22 cm 22 cm Vascular Assessment Pulses: Dorsalis Pedis Palpable: [Left:Yes] [Right:Yes] Electronic Signature(s) Signed: 09/02/2018 3:12:16 PM By: Rodell PernaScott, Dajea Entered By: Rodell PernaScott, Dajea on 09/02/2018 14:18:25 Pettengill, Tasia M. ([id number]) -------------------------------------------------------------------------------- Multi Wound Chart Details Patient Name: Erin [name], Mellissa M. Date of Service: 09/02/2018 1:45 PM Medical Record Number: [id number] Patient Account Number: [id number] Date of Birth/Sex: 05/01/1945 (73 y.o. F) Treating RN: Curtis Sitesorthy, Joanna Primary Care Eliese Kerwood: PATIENT, NO Other Clinician: Referring Verdene Creson: Referral, Self Treating Jacquel Redditt/Extender: STONE III, HOYT Weeks in Treatment: 6 Vital Signs Height(in): 60 Pulse(bpm): 98 Weight(lbs): 213 Blood Pressure(mmHg): 105/75 Body Mass Index(BMI): 42 Temperature(F): 98.9 Respiratory Rate 16 (breaths/min): Photos: Wound Location: Right Lower Leg - Lateral Left Lower Leg - Lateral Left Lower Leg - Lateral, Proximal Wounding Event: Blister Trauma Trauma Primary Etiology: Venous Leg Ulcer Trauma, Other Trauma, Other Comorbid History: Congestive Heart Failure, Congestive Heart Failure, Congestive Heart Failure, Coronary Artery Disease, Coronary Artery Disease, Coronary Artery Disease, Hypertension Hypertension Hypertension Date Acquired: 07/01/2018 08/21/2018 08/21/2018 Weeks of Treatment: 6 1 1  Wound Status: Open Open Open Measurements L x W x D 0.9x1.2x0.1 7.5x8x0.1 1x1x0.1 (cm) Area (cm) : 0.848 47.124 0.785 Volume (cm) : 0.085 4.712 0.079 % Reduction in Area: 99.10% -67.80%  55.60% % Reduction in Volume: 99.10% -67.80% 55.40% Classification: Partial Thickness Partial Thickness Partial Thickness Exudate Amount: Medium Medium Medium Exudate Type: Serous Serosanguineous Serosanguineous Exudate Color: amber red, brown red, brown Wound Margin: Flat and Intact N/A N/A Granulation Amount: Medium (34-66%) Medium (34-66%) Large (67-100%) Granulation Quality: Pink Red Red Necrotic Amount: Medium (34-66%) Medium (34-66%) Small (1-33%) Necrotic Tissue: Eschar, Adherent Slough Eschar, Adherent Slough N/A Exposed Structures: Fat Layer (Subcutaneous Fat Layer (Subcutaneous Fat Layer (Subcutaneous Tissue) Exposed: Yes Tissue) Exposed: Yes Tissue) Exposed: Yes Fascia: No Fascia: No Fascia: No Tendon: No Tendon: No Tendon: No Erin [name], Shaelee M. ([id number]) Muscle: No Muscle: No Muscle: No Joint: No Joint: No Joint: No Bone: No Bone: No Bone: No Epithelialization: Medium (34-66%) None None Wound Number: 4 N/A N/A Photos: N/A N/A Wound Location: Left Lower Leg - Anterior N/A N/A Wounding Event: Blister N/A N/A Primary Etiology: Venous Leg Ulcer N/A N/A Comorbid History: Congestive Heart Failure, N/A N/A Coronary Artery Disease, Hypertension Date Acquired: 08/26/2018 N/A N/A Weeks of Treatment: 0 N/A N/A Wound Status: Open N/A N/A Measurements L x W x D 1.2x1x0.1 N/A N/A (cm) Area (cm) : 0.942 N/A N/A Volume (cm) : 0.094 N/A N/A % Reduction in Area: 0.00% N/A N/A % Reduction in Volume: 0.00%  N/A N/A Classification: Partial Thickness N/A N/A Exudate Amount: Medium N/A N/A Exudate Type: Serosanguineous N/A N/A Exudate Color: red, brown N/A N/A Wound Margin: N/A N/A N/A Granulation Amount: Medium (34-66%) N/A N/A Granulation Quality: Red N/A N/A Necrotic Amount: Medium (34-66%) N/A N/A Necrotic Tissue: Adherent Slough N/A N/A Exposed Structures: Fat Layer (Subcutaneous N/A N/A Tissue) Exposed: Yes Fascia: No Tendon: No Muscle: No Joint: No Bone:  No Epithelialization: None N/A N/A Treatment Notes Electronic Signature(s) Signed: 09/02/2018 4:02:04 PM By: Curtis Sitesorthy, Joanna Entered By: Curtis Sitesorthy, Joanna on 09/02/2018 15:04:19 Erin [name], Romanita M. ([id number]) -------------------------------------------------------------------------------- Multi-Disciplinary Care Plan Details Patient Name: Erin [name], Ysabel M. Date of Service: 09/02/2018 1:45 PM Medical Record Number: [id number] Patient Account Number: [id number] Date of Birth/Sex: 09/10/1945 (73 y.o. F) Treating RN: Curtis Sitesorthy, Joanna Primary Care Adonai Selsor: PATIENT, NO Other Clinician: Referring Elkin Belfield: Referral, Self Treating Elinore Shults/Extender: STONE III, HOYT Weeks in Treatment: 6 Active Inactive Abuse / Safety / Falls / Self Care Management Nursing Diagnoses: Potential for falls Goals: Patient will remain injury free related to falls Date Initiated: 07/22/2018 Target Resolution Date: 11/04/2018 Goal Status: Active Interventions: Assess fall risk on admission and as needed Notes: Orientation to the Wound Care Program Nursing Diagnoses: Knowledge deficit related to the wound healing center program Goals: Patient/caregiver will verbalize understanding of the Wound Healing Center Program Date Initiated: 07/22/2018 Target Resolution Date: 11/04/2018 Goal Status: Active Interventions: Provide education on orientation to the wound center Notes: Venous Leg Ulcer Nursing Diagnoses: Potential for venous Insuffiency (use before diagnosis confirmed) Goals: Patient will maintain optimal edema control Date Initiated: 07/22/2018 Target Resolution Date: 11/04/2018 Goal Status: Active Interventions: Compression as ordered Erin [name], Aarianna M. ([id number]) Notes: Wound/Skin Impairment Nursing Diagnoses: Impaired tissue integrity Goals: Ulcer/skin breakdown will heal within 14 weeks Date Initiated: 07/22/2018 Target Resolution Date: 11/04/2018 Goal Status: Active Interventions: Assess  patient/caregiver ability to obtain necessary supplies Assess patient/caregiver ability to perform ulcer/skin care regimen upon admission and as needed Assess ulceration(s) every visit Notes: Electronic Signature(s) Signed: 09/02/2018 4:02:04 PM By: Curtis Sitesorthy, Joanna Entered By: Curtis Sitesorthy, Joanna on 09/02/2018 15:03:03 Erin [name], Cleo M. ([id number]) -------------------------------------------------------------------------------- Pain Assessment Details Patient Name: Erin [name], Tomorrow M. Date of Service: 09/02/2018 1:45 PM Medical Record Number: [id number] Patient Account Number: [id number] Date of Birth/Sex: 10/22/1945 (73 y.o. F) Treating RN: Rodell PernaScott, Dajea Primary Care Reilyn Nelson: PATIENT, NO Other Clinician: Referring Lyncoln Ledgerwood: Referral, Self Treating Alixandria Friedt/Extender: STONE III, HOYT Weeks in Treatment: 6 Active Problems Location of Pain Severity and Description of Pain Patient Has Paino No Site Locations Pain Management and Medication Current Pain Management: Electronic Signature(s) Signed: 09/02/2018 3:12:16 PM By: Rodell PernaScott, Dajea Entered By: Rodell PernaScott, Dajea on 09/02/2018 13:59:48 Sifuentes, Nimrat M. ([id number]) -------------------------------------------------------------------------------- Wound Assessment Details Patient Name: Erin [name], Mackensey M. Date of Service: 09/02/2018 1:45 PM Medical Record Number: [id number] Patient Account Number: [id number] Date of Birth/Sex: 01/17/1945 (73 y.o. F) Treating RN: Rodell PernaScott, Dajea Primary Care Grace Haggart: PATIENT, NO Other Clinician: Referring Linde Wilensky: Referral, Self Treating Marleta Lapierre/Extender: STONE III, HOYT Weeks in Treatment: 6 Wound Status Wound Number: 1 Primary Venous Leg Ulcer Etiology: Wound Location: Right Lower Leg - Lateral Wound Status: Open Wounding Event: Blister Comorbid Congestive Heart Failure, Coronary Artery Date Acquired: 07/01/2018 History: Disease, Hypertension Weeks Of Treatment: 6 Clustered Wound: No Photos Wound  Measurements Length: (cm) 0.9 Width: (cm) 1.2 Depth: (cm) 0.1 Area: (cm) 0.848 Volume: (cm) 0.085 % Reduction in Area: 99.1% % Reduction in Volume: 99.1% Epithelialization: Medium (34-66%) Wound Description Classification: Partial Thickness Foul Odor Af Wound Margin: Flat and Intact Slough/Fibri Exudate Amount: Medium Exudate  Type: Serous Exudate Color: amber ter Cleansing: No no Yes Wound Bed Granulation Amount: Medium (34-66%) Exposed Structure Granulation Quality: Pink Fascia Exposed: No Necrotic Amount: Medium (34-66%) Fat Layer (Subcutaneous Tissue) Exposed: Yes Necrotic Quality: Eschar, Adherent Slough Tendon Exposed: No Muscle Exposed: No Joint Exposed: No Bone Exposed: No Treatment Notes JOHARA, PERCHES. ([id number]) Wound #1 (Right, Lateral Lower Leg) Notes xeroform and adaptic, abd, 3 layer wrap with unna to anchor Electronic Signature(s) Signed: 09/02/2018 3:12:16 PM By: Rodell Perna Entered By: Rodell Perna on 09/02/2018 14:16:59 Rainford, Rielly M. ([id number]) -------------------------------------------------------------------------------- Wound Assessment Details Patient Name: Erin Bowens. Date of Service: 09/02/2018 1:45 PM Medical Record Number: [id number] Patient Account Number: [id number] Date of Birth/Sex: 1945/09/09 (73 y.o. F) Treating RN: Rodell Perna Primary Care Gatlyn Lipari: PATIENT, NO Other Clinician: Referring Severn Goddard: Referral, Self Treating Toluwanimi Radebaugh/Extender: STONE III, HOYT Weeks in Treatment: 6 Wound Status Wound Number: 2 Primary Trauma, Other Etiology: Wound Location: Left Lower Leg - Lateral Wound Status: Open Wounding Event: Trauma Comorbid Congestive Heart Failure, Coronary Artery Date Acquired: 08/21/2018 History: Disease, Hypertension Weeks Of Treatment: 1 Clustered Wound: No Photos Wound Measurements Length: (cm) 7.5 Width: (cm) 8 Depth: (cm) 0.1 Area: (cm) 47.124 Volume: (cm) 4.712 % Reduction in Area:  -67.8% % Reduction in Volume: -67.8% Epithelialization: None Tunneling: No Undermining: No Wound Description Classification: Partial Thickness Foul Odor A Exudate Amount: Medium Slough/Fibr Exudate Type: Serosanguineous Exudate Color: red, brown fter Cleansing: No ino Yes Wound Bed Granulation Amount: Medium (34-66%) Exposed Structure Granulation Quality: Red Fascia Exposed: No Necrotic Amount: Medium (34-66%) Fat Layer (Subcutaneous Tissue) Exposed: Yes Necrotic Quality: Eschar, Adherent Slough Tendon Exposed: No Muscle Exposed: No Joint Exposed: No Bone Exposed: No Treatment Notes Wound #2 (Left, Lateral Lower Leg) Arcos, Melaysia M. ([id number]) Notes xeroform and adaptic, abd, 3 layer wrap with unna to anchor Electronic Signature(s) Signed: 09/02/2018 3:12:16 PM By: Rodell Perna Entered By: Rodell Perna on 09/02/2018 14:15:35 Culliton, Arlana Pouch ([id number]) -------------------------------------------------------------------------------- Wound Assessment Details Patient Name: Erin Bowens. Date of Service: 09/02/2018 1:45 PM Medical Record Number: [id number] Patient Account Number: [id number] Date of Birth/Sex: 1945/05/25 (73 y.o. F) Treating RN: Rodell Perna Primary Care Mazi Schuff: PATIENT, NO Other Clinician: Referring Crystalle Popwell: Referral, Self Treating Virgil Slinger/Extender: STONE III, HOYT Weeks in Treatment: 6 Wound Status Wound Number: 3 Primary Trauma, Other Etiology: Wound Location: Left Lower Leg - Lateral, Proximal Wound Status: Open Wounding Event: Trauma Comorbid Congestive Heart Failure, Coronary Artery Date Acquired: 08/21/2018 History: Disease, Hypertension Weeks Of Treatment: 1 Clustered Wound: No Photos Wound Measurements Length: (cm) 1 Width: (cm) 1 Depth: (cm) 0.1 Area: (cm) 0.785 Volume: (cm) 0.079 % Reduction in Area: 55.6% % Reduction in Volume: 55.4% Epithelialization: None Wound Description Classification: Partial Thickness Exudate  Amount: Medium Exudate Type: Serosanguineous Exudate Color: red, brown Foul Odor After Cleansing: No Slough/Fibrino Yes Wound Bed Granulation Amount: Large (67-100%) Exposed Structure Granulation Quality: Red Fascia Exposed: No Necrotic Amount: Small (1-33%) Fat Layer (Subcutaneous Tissue) Exposed: Yes Tendon Exposed: No Muscle Exposed: No Joint Exposed: No Bone Exposed: No Treatment Notes Wound #3 (Left, Proximal, Lateral Lower Leg) Tokarczyk, Marguetta M. ([id number]) Notes xeroform and adaptic, abd, 3 layer wrap with unna to anchor Electronic Signature(s) Signed: 09/02/2018 3:12:16 PM By: Rodell Perna Entered By: Rodell Perna on 09/02/2018 14:17:23 Morello, Coda M. ([id number]) -------------------------------------------------------------------------------- Wound Assessment Details Patient Name: Erin Bowens. Date of Service: 09/02/2018 1:45 PM Medical Record Number: [id number] Patient Account Number: [id number] Date of Birth/Sex: [birth year] (73 y.o. F) Treating RN: Rodell Perna Primary Care  Luccia Reinheimer: PATIENT, NO Other Clinician: Referring Bobbette Eakes: Referral, Self Treating Gwendy Boeder/Extender: STONE III, HOYT Weeks in Treatment: 6 Wound Status Wound Number: 4 Primary Venous Leg Ulcer Etiology: Wound Location: Left Lower Leg - Anterior Wound Status: Open Wounding Event: Blister Comorbid Congestive Heart Failure, Coronary Artery Date Acquired: 08/26/2018 History: Disease, Hypertension Weeks Of Treatment: 0 Clustered Wound: No Photos Wound Measurements Length: (cm) 1.2 % Reduction in Width: (cm) 1 % Reduction in Depth: (cm) 0.1 Epithelializat Area: (cm) 0.942 Volume: (cm) 0.094 Area: 0% Volume: 0% ion: None Wound Description Classification: Partial Thickness Foul Odor Afte Exudate Amount: Medium Slough/Fibrino Exudate Type: Serosanguineous Exudate Color: red, brown r Cleansing: No Yes Wound Bed Granulation Amount: Medium (34-66%) Exposed  Structure Granulation Quality: Red Fascia Exposed: No Necrotic Amount: Medium (34-66%) Fat Layer (Subcutaneous Tissue) Exposed: Yes Necrotic Quality: Adherent Slough Tendon Exposed: No Muscle Exposed: No Joint Exposed: No Bone Exposed: No Treatment Notes Wound #4 (Left, Anterior Lower Leg) Keel, Olivianna M. ([id number]) Notes xeroform and adaptic, abd, 3 layer wrap with unna to anchor Electronic Signature(s) Signed: 09/02/2018 3:12:16 PM By: Army Melia Entered By: Army Melia on 09/02/2018 14:16:27 Sellick, Placido Sou ([id number]) -------------------------------------------------------------------------------- Vitals Details Patient Name: [name]. Date of Service: 09/02/2018 1:45 PM Medical Record Number: [id number] Patient Account Number: [id number] Date of Birth/Sex: 04-22-1945 (72 y.o. F) Treating RN: Army Melia Primary Care Margretta Zamorano: PATIENT, NO Other Clinician: Referring Nykiah Ma: Referral, Self Treating Laquentin Loudermilk/Extender: STONE III, HOYT Weeks in Treatment: 6 Vital Signs Time Taken: 14:10 Temperature (F): 98.9 Height (in): 60 Pulse (bpm): 98 Weight (lbs): 213 Respiratory Rate (breaths/min): 16 Body Mass Index (BMI): 41.6 Blood Pressure (mmHg): 105/75 Reference Range: 80 - 120 mg / dl Electronic Signature(s) Signed: 09/02/2018 3:12:16 PM By: Army Melia Entered By: Army Melia on 09/02/2018 14:18:00

## 2018-09-06 ENCOUNTER — Ambulatory Visit (INDEPENDENT_AMBULATORY_CARE_PROVIDER_SITE_OTHER): Payer: Medicare Other | Admitting: *Deleted

## 2018-09-06 DIAGNOSIS — I5022 Chronic systolic (congestive) heart failure: Secondary | ICD-10-CM | POA: Diagnosis not present

## 2018-09-06 DIAGNOSIS — I428 Other cardiomyopathies: Secondary | ICD-10-CM

## 2018-09-08 LAB — CUP PACEART REMOTE DEVICE CHECK
Battery Remaining Longevity: 45 mo
Battery Voltage: 2.96 V
Brady Statistic AP VP Percent: 0 %
Brady Statistic AP VS Percent: 0 %
Brady Statistic AS VP Percent: 0.04 %
Brady Statistic AS VS Percent: 99.96 %
Brady Statistic RA Percent Paced: 0.01 %
Brady Statistic RV Percent Paced: 0.04 %
Date Time Interrogation Session: 20200902152714
HighPow Impedance: 72 Ohm
Implantable Lead Implant Date: 20140718
Implantable Lead Implant Date: 20140718
Implantable Lead Location: 753859
Implantable Lead Location: 753860
Implantable Lead Model: 5076
Implantable Lead Model: 6935
Implantable Pulse Generator Implant Date: 20140718
Lead Channel Impedance Value: 323 Ohm
Lead Channel Impedance Value: 380 Ohm
Lead Channel Impedance Value: 437 Ohm
Lead Channel Pacing Threshold Amplitude: 0.5 V
Lead Channel Pacing Threshold Amplitude: 0.75 V
Lead Channel Pacing Threshold Pulse Width: 0.4 ms
Lead Channel Pacing Threshold Pulse Width: 0.4 ms
Lead Channel Sensing Intrinsic Amplitude: 13.875 mV
Lead Channel Sensing Intrinsic Amplitude: 13.875 mV
Lead Channel Sensing Intrinsic Amplitude: 4.125 mV
Lead Channel Sensing Intrinsic Amplitude: 4.125 mV
Lead Channel Setting Pacing Amplitude: 2 V
Lead Channel Setting Pacing Amplitude: 2.5 V
Lead Channel Setting Pacing Pulse Width: 0.4 ms
Lead Channel Setting Sensing Sensitivity: 0.3 mV

## 2018-09-09 ENCOUNTER — Encounter: Payer: Medicare Other | Attending: Physician Assistant | Admitting: Physician Assistant

## 2018-09-09 ENCOUNTER — Other Ambulatory Visit: Payer: Self-pay

## 2018-09-09 DIAGNOSIS — Z79890 Hormone replacement therapy (postmenopausal): Secondary | ICD-10-CM | POA: Diagnosis not present

## 2018-09-09 DIAGNOSIS — I890 Lymphedema, not elsewhere classified: Secondary | ICD-10-CM | POA: Diagnosis not present

## 2018-09-09 DIAGNOSIS — I872 Venous insufficiency (chronic) (peripheral): Secondary | ICD-10-CM | POA: Diagnosis not present

## 2018-09-09 DIAGNOSIS — Z7902 Long term (current) use of antithrombotics/antiplatelets: Secondary | ICD-10-CM | POA: Diagnosis not present

## 2018-09-09 DIAGNOSIS — I480 Paroxysmal atrial fibrillation: Secondary | ICD-10-CM | POA: Insufficient documentation

## 2018-09-09 DIAGNOSIS — I130 Hypertensive heart and chronic kidney disease with heart failure and stage 1 through stage 4 chronic kidney disease, or unspecified chronic kidney disease: Secondary | ICD-10-CM | POA: Diagnosis not present

## 2018-09-09 DIAGNOSIS — L97821 Non-pressure chronic ulcer of other part of left lower leg limited to breakdown of skin: Secondary | ICD-10-CM | POA: Insufficient documentation

## 2018-09-09 DIAGNOSIS — L97812 Non-pressure chronic ulcer of other part of right lower leg with fat layer exposed: Secondary | ICD-10-CM | POA: Insufficient documentation

## 2018-09-09 DIAGNOSIS — Z79899 Other long term (current) drug therapy: Secondary | ICD-10-CM | POA: Diagnosis not present

## 2018-09-09 DIAGNOSIS — N183 Chronic kidney disease, stage 3 (moderate): Secondary | ICD-10-CM | POA: Diagnosis not present

## 2018-09-09 DIAGNOSIS — Z7901 Long term (current) use of anticoagulants: Secondary | ICD-10-CM | POA: Diagnosis not present

## 2018-09-09 DIAGNOSIS — I5042 Chronic combined systolic (congestive) and diastolic (congestive) heart failure: Secondary | ICD-10-CM | POA: Insufficient documentation

## 2018-09-09 NOTE — Progress Notes (Signed)
Erin [name], [name] ([id number]) Visit Report for 09/09/2018 Arrival Information Details Patient Name: Erin [name], [name] Date of Service: 09/09/2018 2:00 PM Medical Record Number: [id number] Patient Account Number: [id number] Date of Birth/Sex: 01/28/1945 (73 y.o. F) Treating RN: Erin [name], Erin Primary Care Erin [name] Clinician: Referring Erin [name]: Referral, Self Treating Erin [name]/Extender: Erin [name] Weeks in Treatment: 7 Visit Information History Since Last Visit Added or deleted any medications: No Patient Arrived: Walker Any new allergies or adverse reactions: No Arrival Time: 14:09 Had a fall or experienced change in No Accompanied By: daughter activities of daily living that may affect Transfer Assistance: None risk of falls: Patient Identification Verified: Yes Signs or symptoms of abuse/neglect since last visito No Secondary Verification Process Completed: Yes Hospitalized since last visit: No Implantable device outside of the clinic excluding No cellular tissue based products placed in the center since last visit: Has Dressing in Place as Prescribed: Yes Has Compression in Place as Prescribed: Yes Pain Present Now: No Electronic Signature(s) Signed: 09/09/2018 3:54:12 PM By: Erin [name], [name],RRT,CHT, Erin [name], RRT, CHT Entered By: Erin [name], [name],RRT,CHT, Erin [name] on 09/09/2018 14:10:54 Methot, Erin PouchJULIA M. ([id number]) -------------------------------------------------------------------------------- Compression Therapy Details Patient Name: Erin [name], [name] Date of Service: 09/09/2018 2:00 PM Medical Record Number: [id number] Patient Account Number: [id number] Date of Birth/Sex: 03/30/1945 (73 y.o. F) Treating RN: Erin [name], Erin Primary Care Erin [name]: PATIENT, NO [name] Clinician: Referring Erin [name]: Referral, Self Treating Jyasia Markoff/Extender: Erin [name] Weeks in Treatment: 7 Compression Therapy Performed for Wound Assessment: Wound #2 Left,Lateral Lower  Leg Performed By: Clinician Erin [name], Joanna, RN Compression Type: Three Layer Post Procedure Diagnosis Same as Pre-procedure Electronic Signature(s) Signed: 09/09/2018 4:03:55 PM By: Erin [name], Erin Entered By: Erin [name], Erin on 09/09/2018 14:35:55 Koenigsberg, Erin PouchJULIA M. ([id number]) -------------------------------------------------------------------------------- Compression Therapy Details Patient Name: Erin [name], [name] Date of Service: 09/09/2018 2:00 PM Medical Record Number: [id number] Patient Account Number: [id number] Date of Birth/Sex: 04/03/1945 (73 y.o. F) Treating RN: Erin [name], Erin Primary Care Erin [name]: PATIENT, NO [name] Clinician: Referring Erin [name]: Referral, Self Treating Erin [name]/Extender: Erin [name] Weeks in Treatment: 7 Compression Therapy Performed for Wound Assessment: Wound #3 Left,Proximal,Lateral Lower Leg Performed By: Clinician Erin [name], Joanna, RN Compression Type: Three Layer Post Procedure Diagnosis Same as Pre-procedure Electronic Signature(s) Signed: 09/09/2018 4:03:55 PM By: Erin [name], Erin Entered By: Erin [name], Erin on 09/09/2018 14:35:55 Whittley, Erin PouchJULIA M. ([id number]) -------------------------------------------------------------------------------- Compression Therapy Details Patient Name: Erin [name], [name] Date of Service: 09/09/2018 2:00 PM Medical Record Number: [id number] Patient Account Number: [id number] Date of Birth/Sex: 04/08/1945 (73 y.o. F) Treating RN: Erin [name], Erin Primary Care Erin [name]: PATIENT, NO [name] Clinician: Referring Erin [name]: Referral, Self Treating Erin [name] Weeks in Treatment: 7 Compression Therapy Performed for Wound Assessment: Wound #4 Left,Anterior Lower Leg Performed By: Clinician Erin [name], Joanna, RN Compression Type: Three Layer Post Procedure Diagnosis Same as Pre-procedure Electronic Signature(s) Signed: 09/09/2018 4:03:55 PM By: Erin [name], Erin Entered By: Erin [name], Erin on 09/09/2018  14:35:55 Bledsoe, Erin PouchJULIA M. ([id number]) -------------------------------------------------------------------------------- Encounter Discharge Information Details Patient Name: Erin [name], [name] Date of Service: 09/09/2018 2:00 PM Medical Record Number: [id number] Patient Account Number: [id number] Date of Birth/Sex: 08/10/1945 (73 y.o. F) Treating RN: Erin [name], Erin Primary Care Nary Sneed: PATIENT, NO [name] Clinician: Referring Amond Speranza: Referral, Self Treating Erin [name]: Erin [name] Weeks in Treatment: 7 Encounter Discharge Information Items Discharge Condition: Stable Ambulatory Status: Walker Discharge Destination: Home Transportation: Private Auto Accompanied By: daughter Schedule Follow-up Appointment: Yes Clinical Summary of Care: Electronic Signature(s) Signed: 09/09/2018 4:03:55 PM By: Erin [name], Erin Entered By: Erin [name], Erin on 09/09/2018 14:38:24 Pedrosa, Erin  Judie PetitM. ([id number]) -------------------------------------------------------------------------------- Lower Extremity Assessment Details Patient Name: Erin [name], [name] Date of Service: 09/09/2018 2:00 PM Medical Record Number: [id number] Patient Account Number: [id number] Date of Birth/Sex: 05/25/1945 (73 y.o. F) Treating RN: Erin [name], Erin [name] Primary Care Edeline Greening: PATIENT, NO [name] Clinician: Referring Teralyn Mullins: Referral, Self Treating Kyriakos Babler/Extender: Erin [name] Weeks in Treatment: 7 Edema Assessment Assessed: [Left: No] [Right: No] Edema: [Left: No] [Right: No] Calf Left: Right: Point of Measurement: 30 cm From Medial Instep 31 cm 31 cm Ankle Left: Right: Point of Measurement: 9 cm From Medial Instep 22 cm 22 cm Vascular Assessment Pulses: Dorsalis Pedis Palpable: [Left:Yes] [Right:Yes] Electronic Signature(s) Signed: 09/09/2018 2:53:02 PM By: Erin [name], Erin [name] Entered By: Erin [name], Erin [name] on 09/09/2018 14:21:19 Benard, Erin PouchJULIA M.  ([id number]) -------------------------------------------------------------------------------- Multi Wound Chart Details Patient Name: Erin [name], [name] Date of Service: 09/09/2018 2:00 PM Medical Record Number: [id number] Patient Account Number: [id number] Date of Birth/Sex: 10/09/1945 (73 y.o. F) Treating RN: Erin [name], Erin Primary Care Aundra Espin: PATIENT, NO [name] Clinician: Referring Nikash Mortensen: Referral, Self Treating Twisha Vanpelt/Extender: Erin [name] Weeks in Treatment: 7 Vital Signs Height(in): 60 Pulse(bpm): 91 Weight(lbs): 213 Blood Pressure(mmHg): 121/57 Body Mass Index(BMI): 42 Temperature(F): 98.4 Respiratory Rate 16 (breaths/min): Photos: Wound Location: Right Lower Leg - Lateral Left Lower Leg - Lateral Left Lower Leg - Lateral, Proximal Wounding Event: Blister Trauma Trauma Primary Etiology: Venous Leg Ulcer Trauma, [name] Trauma, [name] Comorbid History: Congestive Heart Failure, Congestive Heart Failure, Congestive Heart Failure, Coronary Artery Disease, Coronary Artery Disease, Coronary Artery Disease, Hypertension Hypertension Hypertension Date Acquired: 07/01/2018 08/21/2018 08/21/2018 Weeks of Treatment: 7 2 2  Wound Status: Open Open Open Measurements L x W x D 0x0x0 7.6x8.5x0.1 0.9x1x0.1 (cm) Area (cm) : 0 50.737 0.707 Volume (cm) : 0 5.074 0.071 % Reduction in Area: 100.00% -80.70% 60.00% % Reduction in Volume: 100.00% -80.70% 59.90% Classification: Partial Thickness Partial Thickness Partial Thickness Exudate Amount: Medium Medium Medium Exudate Type: Serous Serosanguineous Serosanguineous Exudate Color: amber red, brown red, brown Wound Margin: Flat and Intact N/A N/A Granulation Amount: None Present (0%) Large (67-100%) Large (67-100%) Granulation Quality: N/A Red Red Necrotic Amount: Medium (34-66%) Small (1-33%) Small (1-33%) Necrotic Tissue: Eschar, Adherent Slough Adherent Slough N/A Exposed Structures: Fascia: No Fat Layer (Subcutaneous Fat Layer  (Subcutaneous Fat Layer (Subcutaneous Tissue) Exposed: Yes Tissue) Exposed: Yes Tissue) Exposed: No Fascia: No Fascia: No Tendon: No Tendon: No Tendon: No Erin [name], Daijanae M. ([id number]) Muscle: No Muscle: No Muscle: No Joint: No Joint: No Joint: No Bone: No Bone: No Bone: No Epithelialization: Large (67-100%) None None Wound Number: 4 N/A N/A Photos: N/A N/A Wound Location: Left Lower Leg - Anterior N/A N/A Wounding Event: Blister N/A N/A Primary Etiology: Venous Leg Ulcer N/A N/A Comorbid History: Congestive Heart Failure, N/A N/A Coronary Artery Disease, Hypertension Date Acquired: 08/26/2018 N/A N/A Weeks of Treatment: 1 N/A N/A Wound Status: Open N/A N/A Measurements L x W x D 0.1x0.1x0.1 N/A N/A (cm) Area (cm) : 0.008 N/A N/A Volume (cm) : 0.001 N/A N/A % Reduction in Area: 99.20% N/A N/A % Reduction in Volume: 98.90% N/A N/A Classification: Partial Thickness N/A N/A Exudate Amount: Medium N/A N/A Exudate Type: Serosanguineous N/A N/A Exudate Color: red, brown N/A N/A Wound Margin: N/A N/A N/A Granulation Amount: Medium (34-66%) N/A N/A Granulation Quality: Red N/A N/A Necrotic Amount: Medium (34-66%) N/A N/A Necrotic Tissue: Adherent Slough N/A N/A Exposed Structures: Fat Layer (Subcutaneous N/A N/A Tissue) Exposed: Yes Fascia: No Tendon: No Muscle: No Joint: No Bone: No Epithelialization: None N/A N/A Treatment Notes  Electronic Signature(s) Signed: 09/09/2018 4:03:55 PM By: Montey Hora Entered By: Montey Hora on 09/09/2018 14:34:24 [name] ([id number]) -------------------------------------------------------------------------------- Huetter Details Patient Name: MARRISSA, DAI. Date of Service: 09/09/2018 2:00 PM Medical Record Number: [id number] Patient Account Number: [id number] Date of Birth/Sex: 22-Jul-1945 (73 y.o. F) Treating RN: Montey Hora Primary Care Boaz Berisha: PATIENT, NO [name] Clinician: Referring  Jaci Desanto: Referral, Self Treating Raad Clayson/Extender: Erin [name] Weeks in Treatment: 7 Active Inactive Abuse / Safety / Falls / Self Care Management Nursing Diagnoses: Potential for falls Goals: Patient will remain injury free related to falls Date Initiated: 07/22/2018 Target Resolution Date: 11/04/2018 Goal Status: Active Interventions: Assess fall risk on admission and as needed Notes: Orientation to the Wound Care Program Nursing Diagnoses: Knowledge deficit related to the wound healing center program Goals: Patient/caregiver will verbalize understanding of the [facility] Date Initiated: 07/22/2018 Target Resolution Date: 11/04/2018 Goal Status: Active Interventions: Provide education on orientation to the wound center Notes: Venous Leg Ulcer Nursing Diagnoses: Potential for venous Insuffiency (use before diagnosis confirmed) Goals: Patient will maintain optimal edema control Date Initiated: 07/22/2018 Target Resolution Date: 11/04/2018 Goal Status: Active Interventions: Compression as ordered LEATHIA, FARNELL ([id number]) Notes: Wound/Skin Impairment Nursing Diagnoses: Impaired tissue integrity Goals: Ulcer/skin breakdown will heal within 14 weeks Date Initiated: 07/22/2018 Target Resolution Date: 11/04/2018 Goal Status: Active Interventions: Assess patient/caregiver ability to obtain necessary supplies Assess patient/caregiver ability to perform ulcer/skin care regimen upon admission and as needed Assess ulceration(s) every visit Notes: Electronic Signature(s) Signed: 09/09/2018 4:03:55 PM By: Montey Hora Entered By: Montey Hora on 09/09/2018 14:34:14 Sloan, Placido Sou ([id number]) -------------------------------------------------------------------------------- Pain Assessment Details Patient Name: [name]. Date of Service: 09/09/2018 2:00 PM Medical Record Number: [id number] Patient Account Number: [id number] Date of  Birth/Sex: 19-Sep-1945 (73 y.o. F) Treating RN: Montey Hora Primary Care Blondina Coderre: PATIENT, NO [name] Clinician: Referring Brodey Bonn: Referral, Self Treating Lakeia Bradshaw/Extender: Erin [name] Weeks in Treatment: 7 Active Problems Location of Pain Severity and Description of Pain Patient Has Paino No Site Locations Pain Management and Medication Current Pain Management: Electronic Signature(s) Signed: 09/09/2018 3:54:12 PM By: Lorine Bears [name], RRT, CHT Signed: 09/09/2018 4:03:55 PM By: Montey Hora Entered By: Lorine Bears on 09/09/2018 14:11:01 [name] ([id number]) -------------------------------------------------------------------------------- Patient/Caregiver Education Details Patient Name: [name]. Date of Service: 09/09/2018 2:00 PM Medical Record Number: [id number] Patient Account Number: [id number] Date of Birth/Gender: 1945/04/13 (73 y.o. F) Treating RN: Montey Hora Primary Care Physician: PATIENT, NO [name] Clinician: Referring Physician: Referral, Self Treating Physician/Extender: Melburn Hake, [name] Weeks in Treatment: 7 Education Assessment Education Provided To: Patient and Caregiver Education Topics Provided Venous: Handouts: [name]: ongoing compression Methods: Demonstration, Explain/Verbal Responses: State content correctly Electronic Signature(s) Signed: 09/09/2018 4:03:55 PM By: Montey Hora Entered By: Montey Hora on 09/09/2018 14:36:30 Oertel, Jailen M. ([id number]) -------------------------------------------------------------------------------- Wound Assessment Details Patient Name: [name]. Date of Service: 09/09/2018 2:00 PM Medical Record Number: [id number] Patient Account Number: [id number] Date of Birth/Sex: 10-23-1945 (73 y.o. F) Treating RN: Army Melia Primary Care Jesicca Dipierro: PATIENT, NO [name] Clinician: Referring Raney Antwine: Referral, Self Treating Moreen Piggott/Extender: Erin [name] Weeks  in Treatment: 7 Wound Status Wound Number: 1 Primary Venous Leg Ulcer Etiology: Wound Location: Right Lower Leg - Lateral Wound Status: Open Wounding Event: Blister Comorbid Congestive Heart Failure, Coronary Artery Date Acquired: 07/01/2018 History: Disease, Hypertension Weeks Of Treatment: 7 Clustered Wound: No Photos Wound Measurements Length: (cm) 0 % Reduct Width: (cm) 0 % Reduct Depth: (cm) 0 Epitheli  Area: (cm) 0 Tunneli Volume: (cm) 0 Undermi ion in Area: 100% ion in Volume: 100% alization: Large (67-100%) ng: No ning: No Wound Description Classification: Partial Thickness Foul Odo Wound Margin: Flat and Intact Slough/F Exudate Amount: Medium Exudate Type: Serous Exudate Color: amber r After Cleansing: No ibrino Yes Wound Bed Granulation Amount: None Present (0%) Exposed Structure Necrotic Amount: Medium (34-66%) Fascia Exposed: No Necrotic Quality: Eschar, Adherent Slough Fat Layer (Subcutaneous Tissue) Exposed: No Tendon Exposed: No Muscle Exposed: No Joint Exposed: No Bone Exposed: No Electronic Signature(s) Erin [name], Dreonna M. ([id number]) Signed: 09/09/2018 2:53:02 PM By: Erin [name], Erin [name] Entered By: Erin [name], Erin [name] on 09/09/2018 14:19:20 Krupp, Erin PouchJULIA M. ([id number]) -------------------------------------------------------------------------------- Wound Assessment Details Patient Name: Erin [name], Volanda M. Date of Service: 09/09/2018 2:00 PM Medical Record Number: [id number] Patient Account Number: [id number] Date of Birth/Sex: 11/03/1945 (73 y.o. F) Treating RN: Erin [name], Erin [name] Primary Care Shifra Swartzentruber: PATIENT, NO [name] Clinician: Referring Tahliyah Anagnos: Referral, Self Treating Najmah Carradine/Extender: Erin [name] Weeks in Treatment: 7 Wound Status Wound Number: 2 Primary Trauma, [name] Etiology: Wound Location: Left Lower Leg - Lateral Wound Status: Open Wounding Event: Trauma Comorbid Congestive Heart Failure, Coronary Artery Date Acquired: 08/21/2018 History:  Disease, Hypertension Weeks Of Treatment: 2 Clustered Wound: No Photos Wound Measurements Length: (cm) 7.6 % Reduction i Width: (cm) 8.5 % Reduction i Depth: (cm) 0.1 Epithelializa Area: (cm) 50.737 Tunneling: Volume: (cm) 5.074 Undermining: n Area: -80.7% n Volume: -80.7% tion: None No No Wound Description Classification: Partial Thickness Foul Odor Aft Exudate Amount: Medium Slough/Fibrin Exudate Type: Serosanguineous Exudate Color: red, brown er Cleansing: No o Yes Wound Bed Granulation Amount: Large (67-100%) Exposed Structure Granulation Quality: Red Fascia Exposed: No Necrotic Amount: Small (1-33%) Fat Layer (Subcutaneous Tissue) Exposed: Yes Necrotic Quality: Adherent Slough Tendon Exposed: No Muscle Exposed: No Joint Exposed: No Bone Exposed: No Treatment Notes Wound #2 (Left, Lateral Lower Leg) Ledvina, Eliz M. ([id number]) Notes L proximal Lateral and anterior - xeroform; L lateral- adaptic, xtrasorb and 3 layer wrap on left Electronic Signature(s) Signed: 09/09/2018 2:53:02 PM By: Erin [name], Erin [name] Entered By: Erin [name], Erin [name] on 09/09/2018 14:19:49 Siglin, Erin PouchJULIA M. ([id number]) -------------------------------------------------------------------------------- Wound Assessment Details Patient Name: Erin [name], Cheryal M. Date of Service: 09/09/2018 2:00 PM Medical Record Number: [id number] Patient Account Number: [id number] Date of Birth/Sex: 12/16/1945 (73 y.o. F) Treating RN: Erin [name], Erin [name] Primary Care Kelbi Renstrom: PATIENT, NO [name] Clinician: Referring Randye Treichler: Referral, Self Treating Bassam Dresch/Extender: Erin [name] Weeks in Treatment: 7 Wound Status Wound Number: 3 Primary Trauma, [name] Etiology: Wound Location: Left Lower Leg - Lateral, Proximal Wound Status: Open Wounding Event: Trauma Comorbid Congestive Heart Failure, Coronary Artery Date Acquired: 08/21/2018 History: Disease, Hypertension Weeks Of Treatment: 2 Clustered Wound: No Photos Wound  Measurements Length: (cm) 0.9 Width: (cm) 1 Depth: (cm) 0.1 Area: (cm) 0.707 Volume: (cm) 0.071 % Reduction in Area: 60% % Reduction in Volume: 59.9% Epithelialization: None Tunneling: No Undermining: No Wound Description Classification: Partial Thickness Exudate Amount: Medium Exudate Type: Serosanguineous Exudate Color: red, brown Foul Odor After Cleansing: No Slough/Fibrino Yes Wound Bed Granulation Amount: Large (67-100%) Exposed Structure Granulation Quality: Red Fascia Exposed: No Necrotic Amount: Small (1-33%) Fat Layer (Subcutaneous Tissue) Exposed: Yes Tendon Exposed: No Muscle Exposed: No Joint Exposed: No Bone Exposed: No Treatment Notes Wound #3 (Left, Proximal, Lateral Lower Leg) Riebe, Taisa M. ([id number]) Notes L proximal Lateral and anterior - xeroform; L lateral- adaptic, xtrasorb and 3 layer wrap on left Electronic Signature(s) Signed: 09/09/2018 2:53:02 PM By: Erin [name], Erin [name] Entered By: Erin [name], Erin [name] on 09/09/2018 14:20:20 Melby, Zakaiya M. (  [id number]) -------------------------------------------------------------------------------- Wound Assessment Details Patient Name: JENALISE, HISHMEH. Date of Service: 09/09/2018 2:00 PM Medical Record Number: [id number] Patient Account Number: [id number] Date of Birth/Sex: [birth year] (73 y.o. F) Treating RN: Erin Perna Primary Care Malinda Mayden: PATIENT, NO [name] Clinician: Referring Maleni Seyer: Referral, Self Treating Corleone Biegler/Extender: Erin [name] Weeks in Treatment: 7 Wound Status Wound Number: 4 Primary Venous Leg Ulcer Etiology: Wound Location: Left Lower Leg - Anterior Wound Status: Open Wounding Event: Blister Comorbid Congestive Heart Failure, Coronary Artery Date Acquired: 08/26/2018 History: Disease, Hypertension Weeks Of Treatment: 1 Clustered Wound: No Photos Wound Measurements Length: (cm) 0.1 % Reduction in Width: (cm) 0.1 % Reduction in Depth: (cm) 0.1 Epithelializat Area: (cm)  0.008 Tunneling: Volume: (cm) 0.001 Undermining: Area: 99.2% Volume: 98.9% ion: None No No Wound Description Classification: Partial Thickness Foul Odor Afte Exudate Amount: Medium Slough/Fibrino Exudate Type: Serosanguineous Exudate Color: red, brown r Cleansing: No Yes Wound Bed Granulation Amount: Medium (34-66%) Exposed Structure Granulation Quality: Red Fascia Exposed: No Necrotic Amount: Medium (34-66%) Fat Layer (Subcutaneous Tissue) Exposed: Yes Necrotic Quality: Adherent Slough Tendon Exposed: No Muscle Exposed: No Joint Exposed: No Bone Exposed: No Treatment Notes Wound #4 (Left, Anterior Lower Leg) Lotter, Zohal M. ([id number]) Notes L proximal Lateral and anterior - xeroform; L lateral- adaptic, xtrasorb and 3 layer wrap on left Electronic Signature(s) Signed: 09/09/2018 2:53:02 PM By: Erin Perna Entered By: Erin Perna on 09/09/2018 14:20:48 Rotz, Erin [name] ([id number]) -------------------------------------------------------------------------------- Vitals Details Patient Name: Erin Bowens. Date of Service: 09/09/2018 2:00 PM Medical Record Number: [id number] Patient Account Number: [id number] Date of Birth/Sex: 09/16/1945 (73 y.o. F) Treating RN: Erin Sites Primary Care Caniya Tagle: PATIENT, NO [name] Clinician: Referring Medhansh Brinkmeier: Referral, Self Treating Deuce Paternoster/Extender: Erin [name] Weeks in Treatment: 7 Vital Signs Time Taken: 14:11 Temperature (F): 98.4 Height (in): 60 Pulse (bpm): 91 Weight (lbs): 213 Respiratory Rate (breaths/min): 16 Body Mass Index (BMI): 41.6 Blood Pressure (mmHg): 121/57 Reference Range: 80 - 120 mg / dl Electronic Signature(s) Signed: 09/09/2018 3:54:12 PM By: Erin Martes [name], RRT, CHT Entered By: Erin Martes on 09/09/2018 14:12:21

## 2018-09-09 NOTE — Progress Notes (Addendum)
LEYLA, SOLIZ ([id number]) Visit Report for 09/09/2018 Chief Complaint Document Details Patient Name: Erin [name], Erin [name]. Date of Service: 09/09/2018 2:00 PM Medical Record Number: [id number] Patient Account Number: [id number] Date of Birth/Sex: March 08, 1945 (73 y.o. F) Treating RN: Curtis Sites Primary Care Provider: PATIENT, NO Other Clinician: Referring Provider: Referral, Self Treating Provider/Extender: Linwood Dibbles, Supreme Rybarczyk Weeks in Treatment: 7 Information Obtained from: Patient Chief Complaint Right LE Ulcer Electronic Signature(s) Signed: 09/09/2018 2:16:26 PM By: Lenda Kelp PA-C Entered By: Lenda Kelp on 09/09/2018 14:16:26 Erin [name], Erin [name] ([id number]) -------------------------------------------------------------------------------- HPI Details Patient Name: Erin [name]. Date of Service: 09/09/2018 2:00 PM Medical Record Number: [id number] Patient Account Number: [id number] Date of Birth/Sex: Sep 20, 1945 (73 y.o. F) Treating RN: Curtis Sites Primary Care Provider: PATIENT, NO Other Clinician: Referring Provider: Referral, Self Treating Provider/Extender: Linwood Dibbles, Sanna Porcaro Weeks in Treatment: 7 History of Present Illness HPI Description: 07/22/18 patient presents today for initial evaluation regarding issues that she is having with wounds on the right lateral lower leg. The good news is that these actually appear to be showing signs of already feeling which is good news. There still a lot of maceration and drainage as well as edema noted at this point. This is something that from the edema standpoint has been going on for some time the patient obviously as lymphedema among issues with venous stasis as well. With that being said I do believe at this point that she's having some discomfort at the site fortunately there's no evidence of active infection at this time which is good news. The patient has been having issues as well it sounds like with something completely  separate in regard to pain she's unable to raise her legs and elevate them due to the fact that whenever she does she has a burning pain that goes into her right hip laterally and then runs down into her foot in times as well. This actually appears to be something emanating from her back which I discussed with her today as well I think that's completely separate from the chronic venous stasis and lymphedema which we are going to be managing at this point. No fevers, chills, nausea, or vomiting noted at this time. 07/29/18 on evaluation today patient appears to be doing better in regard to the overall wound size which is good news. With that being said she is still having a significant amount of pain some of this is directly related to be open wound and even to the fact that there might be a little bit of infection at this point. With that being said things are not looking too poorly at this point which is good news. I did obtain a culture today to test this further. With that being said with regard to the orthopedic referral this has been sent they have not heard from the office as of yet. Advised that they do not hear anything by Monday that they give the office a call. 08/05/2018 on evaluation today patient appears to be doing better with regard to her lower extremity ulcer. There is just a very small area that still has any opening remaining at this time which is excellent news. She has been tolerating the compression wrap without complication. I do believe this has been extremely beneficial for her which is excellent news. Again we have been utilizing a 3 layer compression. She was also placed on Cipro most recently I had to change her from doxycycline to Cipro due to the culture findings. I do not  see any evidence of more significant infection compared to last week. 08/12/2018 on evaluation today patient appears to be doing very well with regard to her right lower extremity ulcer. She has been  tolerating the dressing changes without complication. Fortunately there is no signs of infection at this time. Overall I am very pleased with the progress she is made the wound is definitely measuring smaller today compared to last visit. She did see Okey RegalCarol who is Dr. Ethelene Halamos PA at [facility] which is now [facility] in [place]. Nonetheless she did feel like the patient was having some component of back pain causing her leg pain and recommended physical therapy to start with. Pending how things progress they may consider injections in the future but really do not want to do that until her wound is healed the good news is the wound seems to be progressing quite nicely. 08/19/18 on evaluation today patient appears to be doing quite well with regard to her wound currently. She has been tolerating the dressing changes without complication. Fortunately there's no signs of active infection at this time. She has not started physical therapy as of yet. 08/26/2018 on evaluation today patient actually appears to be doing okay in regard to her right lower extremity ulcer although it does seem somewhat dry. In regard to left lower extremity she actually has a new blister area unfortunately that we are going to have to address today fortunately this does not appear to be too deep but unfortunately is can be something that we have to address as well from the standpoint of getting everything to heal. There is a little bit of erythema noted as well I may want to place her back on a short course of oral antibiotics. 09/02/18 on evaluation today patient appears to be doing better with regard to her ulcers at this point. In fact she is showing signs of improvement at several sites and this is great news. Fortunately there's no signs of active infection at this time. Nonetheless I feel like she is much closer to healing even after a very short time. No fevers, chills, nausea, or vomiting noted at this  time. Erin [name], [name] ([id number]) 09/09/2018 on evaluation today patient appears to be doing well with regard to her right lower extremity this in fact is completely healed today. With regard to her left lower extremity she seems to be doing much better although this is not completely healed. Fortunately there is no signs of active infection at this time which is great news. Overall feeling she is tolerating the wraps quite well. Electronic Signature(s) Signed: 09/09/2018 6:20:04 PM By: Lenda KelpStone III, Roderick Calo PA-C Entered By: Lenda KelpStone III, Nonnie Pickney on 09/09/2018 18:20:04 Erin [name], [name] ([id number]) -------------------------------------------------------------------------------- Physical Exam Details Patient Name: Erin [name], Riana M. Date of Service: 09/09/2018 2:00 PM Medical Record Number: [id number] Patient Account Number: [id number] Date of Birth/Sex: 11/17/1945 (73 y.o. F) Treating RN: Curtis Sitesorthy, Joanna Primary Care Provider: PATIENT, NO Other Clinician: Referring Provider: Referral, Self Treating Provider/Extender: STONE III, Osbaldo Mark Weeks in Treatment: 7 Constitutional Well-nourished and well-hydrated in no acute distress. Respiratory normal breathing without difficulty. clear to auscultation bilaterally. Cardiovascular regular rate and rhythm with normal S1, S2. Psychiatric this patient is able to make decisions and demonstrates good insight into disease process. Alert and Oriented x 3. pleasant and cooperative. Notes Upon inspection patient's wound bed actually showed signs of good granulation at this time. Overall I am extremely happy with the way things seem to be progressing and again the patient  is happy as well with the progress. No sharp debridement was required at any location today which is great news. Electronic Signature(s) Signed: 09/09/2018 6:21:19 PM By: Lenda Kelp PA-C Entered By: Lenda Kelp on 09/09/2018 18:21:19 Erin [name], Erin [name]  ([id number]) -------------------------------------------------------------------------------- Physician Orders Details Patient Name: Erin [name], KUNZ. Date of Service: 09/09/2018 2:00 PM Medical Record Number: [id number] Patient Account Number: [id number] Date of Birth/Sex: February 18, 1945 (73 y.o. F) Treating RN: Curtis Sites Primary Care Provider: PATIENT, NO Other Clinician: Referring Provider: Referral, Self Treating Provider/Extender: STONE III, Neshia Mckenzie Weeks in Treatment: 7 Verbal / Phone Orders: No Diagnosis Coding ICD-10 Coding Code Description I87.2 Venous insufficiency (chronic) (peripheral) I89.0 Lymphedema, not elsewhere classified L97.812 Non-pressure chronic ulcer of other part of right lower leg with fat layer exposed L97.821 Non-pressure chronic ulcer of other part of left lower leg limited to breakdown of skin N18.3 Chronic kidney disease, stage 3 (moderate) I50.42 Chronic combined systolic (congestive) and diastolic (congestive) heart failure I10 Essential (primary) hypertension I48.0 Paroxysmal atrial fibrillation Wound Cleansing Wound #2 Left,Lateral Lower Leg o Clean wound with Normal Saline. o May shower with protection. - Please do not get your wrap wet Wound #3 Left,Proximal,Lateral Lower Leg o Clean wound with Normal Saline. o May shower with protection. - Please do not get your wrap wet Anesthetic (add to Medication List) Wound #2 Left,Lateral Lower Leg o Topical Lidocaine 4% cream applied to wound bed prior to debridement (In Clinic Only). Wound #3 Left,Proximal,Lateral Lower Leg o Topical Lidocaine 4% cream applied to wound bed prior to debridement (In Clinic Only). Primary Wound Dressing Wound #2 Left,Lateral Lower Leg o XtraSorb o Other: - adaptic Wound #3 Left,Proximal,Lateral Lower Leg o Xeroform Wound #4 Left,Anterior Lower Leg o Xeroform Secondary Dressing Wound #3 Left,Proximal,Lateral Lower Leg o ABD pad Zegers, Kimyetta M.  ([id number]) Wound #4 Left,Anterior Lower Leg o ABD pad Dressing Change Frequency Wound #2 Left,Lateral Lower Leg o Change dressing every week Wound #3 Left,Proximal,Lateral Lower Leg o Change dressing every week Follow-up Appointments o Return Appointment in 1 week. Edema Control Wound #2 Left,Lateral Lower Leg o 3 Layer Compression System - Left Lower Extremity Wound #4 Left,Anterior Lower Leg o 3 Layer Compression System - Left Lower Extremity Wound #3 Left,Proximal,Lateral Lower Leg o 3 Layer Compression System - Left Lower Extremity Electronic Signature(s) Signed: 09/09/2018 4:03:55 PM By: Curtis Sites Signed: 09/09/2018 6:57:28 PM By: Lenda Kelp PA-C Entered By: Curtis Sites on 09/09/2018 14:35:31 Eick, Erin [name] ([id number]) -------------------------------------------------------------------------------- Problem List Details Patient Name: Erin [name], TETTER. Date of Service: 09/09/2018 2:00 PM Medical Record Number: [id number] Patient Account Number: [id number] Date of Birth/Sex: April 30, 1945 (73 y.o. F) Treating RN: Curtis Sites Primary Care Provider: PATIENT, NO Other Clinician: Referring Provider: Referral, Self Treating Provider/Extender: Linwood Dibbles, Kendle Erker Weeks in Treatment: 7 Active Problems ICD-10 Evaluated Encounter Code Description Active Date Today Diagnosis I87.2 Venous insufficiency (chronic) (peripheral) 07/22/2018 No Yes I89.0 Lymphedema, not elsewhere classified 07/24/2018 No Yes L97.812 Non-pressure chronic ulcer of other part of right lower leg 07/22/2018 No Yes with fat layer exposed L97.821 Non-pressure chronic ulcer of other part of left lower leg 08/26/2018 No Yes limited to breakdown of skin N18.3 Chronic kidney disease, stage 3 (moderate) 07/22/2018 No Yes I50.42 Chronic combined systolic (congestive) and diastolic 07/22/2018 No Yes (congestive) heart failure I10 Essential (primary) hypertension 07/22/2018 No Yes I48.0 Paroxysmal  atrial fibrillation 07/22/2018 No Yes Inactive Problems Resolved Problems Electronic Signature(s) HIMANI, WHITTY ([id number]) Signed: 09/09/2018 2:16:16 PM By: Lenda Kelp PA-C  Entered By: Worthy Keeler on 09/09/2018 14:16:16 Panuco, Placido Sou ([id number]) -------------------------------------------------------------------------------- Progress Note Details Patient Name: KARLEE, STAFF. Date of Service: 09/09/2018 2:00 PM Medical Record Number: [id number] Patient Account Number: [id number] Date of Birth/Sex: 1945-07-02 (73 y.o. F) Treating RN: Montey Hora Primary Care Provider: PATIENT, NO Other Clinician: Referring Provider: Referral, Self Treating Provider/Extender: STONE III, Leen Tworek Weeks in Treatment: 7 Subjective Chief Complaint Information obtained from Patient Right LE Ulcer History of Present Illness (HPI) 07/22/18 patient presents today for initial evaluation regarding issues that she is having with wounds on the right lateral lower leg. The good news is that these actually appear to be showing signs of already feeling which is good news. There still a lot of maceration and drainage as well as edema noted at this point. This is something that from the edema standpoint has been going on for some time the patient obviously as lymphedema among issues with venous stasis as well. With that being said I do believe at this point that she's having some discomfort at the site fortunately there's no evidence of active infection at this time which is good news. The patient has been having issues as well it sounds like with something completely separate in regard to pain she's unable to raise her legs and elevate them due to the fact that whenever she does she has a burning pain that goes into her right hip laterally and then runs down into her foot in times as well. This actually appears to be something emanating from her back which I discussed with her today as well I think that's  completely separate from the chronic venous stasis and lymphedema which we are going to be managing at this point. No fevers, chills, nausea, or vomiting noted at this time. 07/29/18 on evaluation today patient appears to be doing better in regard to the overall wound size which is good news. With that being said she is still having a significant amount of pain some of this is directly related to be open wound and even to the fact that there might be a little bit of infection at this point. With that being said things are not looking too poorly at this point which is good news. I did obtain a culture today to test this further. With that being said with regard to the orthopedic referral this has been sent they have not heard from the office as of yet. Advised that they do not hear anything by Monday that they give the office a call. 08/05/2018 on evaluation today patient appears to be doing better with regard to her lower extremity ulcer. There is just a very small area that still has any opening remaining at this time which is excellent news. She has been tolerating the compression wrap without complication. I do believe this has been extremely beneficial for her which is excellent news. Again we have been utilizing a 3 layer compression. She was also placed on Cipro most recently I had to change her from doxycycline to Cipro due to the culture findings. I do not see any evidence of more significant infection compared to last week. 08/12/2018 on evaluation today patient appears to be doing very well with regard to her right lower extremity ulcer. She has been tolerating the dressing changes without complication. Fortunately there is no signs of infection at this time. Overall I am very pleased with the progress she is made the wound is definitely measuring smaller today compared to last visit. She  did see Okey RegalCarol who is Dr. Ethelene Halamos PA at [facility] which is now [facility] in [place].  Nonetheless she did feel like the patient was having some component of back pain causing her leg pain and recommended physical therapy to start with. Pending how things progress they may consider injections in the future but really do not want to do that until her wound is healed the good news is the wound seems to be progressing quite nicely. 08/19/18 on evaluation today patient appears to be doing quite well with regard to her wound currently. She has been tolerating the dressing changes without complication. Fortunately there's no signs of active infection at this time. She has not started physical therapy as of yet. 08/26/2018 on evaluation today patient actually appears to be doing okay in regard to her right lower extremity ulcer although it does seem somewhat dry. In regard to left lower extremity she actually has a new blister area unfortunately that we are going to have to address today fortunately this does not appear to be too deep but unfortunately is can be something that we have to address as well from the standpoint of getting everything to heal. There is a little bit of erythema noted as well I may want Erin [name], Omaya M. ([id number]) to place her back on a short course of oral antibiotics. 09/02/18 on evaluation today patient appears to be doing better with regard to her ulcers at this point. In fact she is showing signs of improvement at several sites and this is great news. Fortunately there's no signs of active infection at this time. Nonetheless I feel like she is much closer to healing even after a very short time. No fevers, chills, nausea, or vomiting noted at this time. 09/09/2018 on evaluation today patient appears to be doing well with regard to her right lower extremity this in fact is completely healed today. With regard to her left lower extremity she seems to be doing much better although this is not completely healed. Fortunately there is no signs of active infection at  this time which is great news. Overall feeling she is tolerating the wraps quite well. Patient History Information obtained from Patient. Family History Cancer - Siblings, Diabetes - Father,Siblings, Heart Disease - Mother,Siblings, Hypertension - Mother, Lung Disease - Siblings, No family history of Hereditary Spherocytosis, Kidney Disease, Seizures, Stroke, Thyroid Problems, Tuberculosis. Social History Never smoker, Marital Status - Single, Alcohol Use - Never, Drug Use - No History, Caffeine Use - Never. Medical History Eyes Denies history of Cataracts, Glaucoma, Optic Neuritis Ear/Nose/Mouth/Throat Denies history of Chronic sinus problems/congestion, Middle ear problems Hematologic/Lymphatic Denies history of Anemia, Hemophilia, Human Immunodeficiency Virus, Lymphedema, Sickle Cell Disease Respiratory Denies history of Aspiration, Asthma, Chronic Obstructive Pulmonary Disease (COPD), Pneumothorax, Sleep Apnea, Tuberculosis Cardiovascular Patient has history of Congestive Heart Failure, Coronary Artery Disease, Hypertension Denies history of Angina, Arrhythmia, Deep Vein Thrombosis, Hypotension, Myocardial Infarction, Peripheral Arterial Disease, Peripheral Venous Disease, Phlebitis, Vasculitis Gastrointestinal Denies history of Cirrhosis , Colitis, Crohn s, Hepatitis A, Hepatitis B, Hepatitis C Endocrine Denies history of Type I Diabetes, Type II Diabetes Genitourinary Denies history of End Stage Renal Disease Immunological Denies history of Lupus Erythematosus, Raynaud s, Scleroderma Integumentary (Skin) Denies history of History of Burn, History of pressure wounds Musculoskeletal Denies history of Gout, Rheumatoid Arthritis, Osteoarthritis, Osteomyelitis Neurologic Denies history of Dementia, Neuropathy, Quadriplegia, Paraplegia, Seizure Disorder Oncologic Denies history of Received Chemotherapy, Received Radiation Psychiatric Denies history of Anorexia/bulimia,  Confinement Anxiety Review of Systems (  ROS) Constitutional Symptoms (General Health) Denies complaints or symptoms of Fatigue, Fever, Chills, Marked Weight Change. Erin [name], BURGES. ([id number]) Respiratory Denies complaints or symptoms of Chronic or frequent coughs, Shortness of Breath. Cardiovascular Complains or has symptoms of LE edema. Denies complaints or symptoms of Chest pain. Psychiatric Denies complaints or symptoms of Anxiety, Claustrophobia. Objective Constitutional Well-nourished and well-hydrated in no acute distress. Vitals Time Taken: 2:11 PM, Height: 60 in, Weight: 213 lbs, BMI: 41.6, Temperature: 98.4 F, Pulse: 91 bpm, Respiratory Rate: 16 breaths/min, Blood Pressure: 121/57 mmHg. Respiratory normal breathing without difficulty. clear to auscultation bilaterally. Cardiovascular regular rate and rhythm with normal S1, S2. Psychiatric this patient is able to make decisions and demonstrates good insight into disease process. Alert and Oriented x 3. pleasant and cooperative. General Notes: Upon inspection patient's wound bed actually showed signs of good granulation at this time. Overall I am extremely happy with the way things seem to be progressing and again the patient is happy as well with the progress. No sharp debridement was required at any location today which is great news. Integumentary (Hair, Skin) Wound #1 status is Open. Original cause of wound was Blister. The wound is located on the Right,Lateral Lower Leg. The wound measures 0cm length x 0cm width x 0cm depth; 0cm^2 area and 0cm^3 volume. There is no tunneling or undermining noted. There is a medium amount of serous drainage noted. The wound margin is flat and intact. There is no granulation within the wound bed. There is a medium (34-66%) amount of necrotic tissue within the wound bed including Eschar and Adherent Slough. Wound #2 status is Open. Original cause of wound was Trauma. The wound is  located on the Left,Lateral Lower Leg. The wound measures 7.6cm length x 8.5cm width x 0.1cm depth; 50.737cm^2 area and 5.074cm^3 volume. There is Fat Layer (Subcutaneous Tissue) Exposed exposed. There is no tunneling or undermining noted. There is a medium amount of serosanguineous drainage noted. There is large (67-100%) red granulation within the wound bed. There is a small (1-33%) amount of necrotic tissue within the wound bed including Adherent Slough. Wound #3 status is Open. Original cause of wound was Trauma. The wound is located on the Left,Proximal,Lateral Lower Leg. The wound measures 0.9cm length x 1cm width x 0.1cm depth; 0.707cm^2 area and 0.071cm^3 volume. There is Fat Layer (Subcutaneous Tissue) Exposed exposed. There is no tunneling or undermining noted. There is a medium amount of serosanguineous drainage noted. There is large (67-100%) red granulation within the wound bed. There is a small (1-33%) amount of necrotic tissue within the wound bed. Erin [name], CARRANZA. ([id number]) Wound #4 status is Open. Original cause of wound was Blister. The wound is located on the Left,Anterior Lower Leg. The wound measures 0.1cm length x 0.1cm width x 0.1cm depth; 0.008cm^2 area and 0.001cm^3 volume. There is Fat Layer (Subcutaneous Tissue) Exposed exposed. There is no tunneling or undermining noted. There is a medium amount of serosanguineous drainage noted. There is medium (34-66%) red granulation within the wound bed. There is a medium (34- 66%) amount of necrotic tissue within the wound bed including Adherent Slough. Assessment Active Problems ICD-10 Venous insufficiency (chronic) (peripheral) Lymphedema, not elsewhere classified Non-pressure chronic ulcer of other part of right lower leg with fat layer exposed Non-pressure chronic ulcer of other part of left lower leg limited to breakdown of skin Chronic kidney disease, stage 3 (moderate) Chronic combined systolic (congestive) and  diastolic (congestive) heart failure Essential (primary) hypertension Paroxysmal atrial fibrillation Procedures Wound #  2 Pre-procedure diagnosis of Wound #2 is a Trauma, Other located on the Left,Lateral Lower Leg . There was a Three Layer Compression Therapy Procedure by Curtis Sitesorthy, Joanna, RN. Post procedure Diagnosis Wound #2: Same as Pre-Procedure Wound #3 Pre-procedure diagnosis of Wound #3 is a Trauma, Other located on the Left,Proximal,Lateral Lower Leg . There was a Three Layer Compression Therapy Procedure by Curtis Sitesorthy, Joanna, RN. Post procedure Diagnosis Wound #3: Same as Pre-Procedure Wound #4 Pre-procedure diagnosis of Wound #4 is a Venous Leg Ulcer located on the Left,Anterior Lower Leg . There was a Three Layer Compression Therapy Procedure by Curtis Sitesorthy, Joanna, RN. Post procedure Diagnosis Wound #4: Same as Pre-Procedure Plan Wound Cleansing: Wound #2 Left,Lateral Lower Leg: Clean wound with Normal Saline. May shower with protection. - Please do not get your wrap wet Beevers, Marcus M. ([id number]) Wound #3 Left,Proximal,Lateral Lower Leg: Clean wound with Normal Saline. May shower with protection. - Please do not get your wrap wet Anesthetic (add to Medication List): Wound #2 Left,Lateral Lower Leg: Topical Lidocaine 4% cream applied to wound bed prior to debridement (In Clinic Only). Wound #3 Left,Proximal,Lateral Lower Leg: Topical Lidocaine 4% cream applied to wound bed prior to debridement (In Clinic Only). Primary Wound Dressing: Wound #2 Left,Lateral Lower Leg: XtraSorb Other: - adaptic Wound #3 Left,Proximal,Lateral Lower Leg: Xeroform Wound #4 Left,Anterior Lower Leg: Xeroform Secondary Dressing: Wound #3 Left,Proximal,Lateral Lower Leg: ABD pad Wound #4 Left,Anterior Lower Leg: ABD pad Dressing Change Frequency: Wound #2 Left,Lateral Lower Leg: Change dressing every week Wound #3 Left,Proximal,Lateral Lower Leg: Change dressing every week Follow-up  Appointments: Return Appointment in 1 week. Edema Control: Wound #2 Left,Lateral Lower Leg: 3 Layer Compression System - Left Lower Extremity Wound #4 Left,Anterior Lower Leg: 3 Layer Compression System - Left Lower Extremity Wound #3 Left,Proximal,Lateral Lower Leg: 3 Layer Compression System - Left Lower Extremity 1. I would recommend that we continue with the current wound care measures including the Adaptic followed by XtraSorb for the left lower extremity for the time being. Xeroform will be used on the other small open areas for the time being. 2. We will also get a continue with a 3 layer compression wrap on the left. Patient will transition to her compression stocking for home use. 3. I recommend she continue to elevate her legs as much as possible and try to keep the edema under control she is in agreement with this plan. We will see patient back for reevaluation in 1 week here in the clinic. If anything worsens or changes patient will contact our office for additional recommendations. Electronic Signature(s) Signed: 09/09/2018 6:22:15 PM By: Lenda KelpStone III, Holly Pring PA-C Entered By: Lenda KelpStone III, Deago Burruss on 09/09/2018 18:22:14 Erin [name], Deanie M. ([id number]) -------------------------------------------------------------------------------- ROS/PFSH Details Patient Name: Erin [name], Ranay M. Date of Service: 09/09/2018 2:00 PM Medical Record Number: [id number] Patient Account Number: [id number] Date of Birth/Sex: 12/02/1945 (73 y.o. F) Treating RN: Curtis Sitesorthy, Joanna Primary Care Provider: PATIENT, NO Other Clinician: Referring Provider: Referral, Self Treating Provider/Extender: STONE III, Jaylanie Boschee Weeks in Treatment: 7 Information Obtained From Patient Constitutional Symptoms (General Health) Complaints and Symptoms: Negative for: Fatigue; Fever; Chills; Marked Weight Change Respiratory Complaints and Symptoms: Negative for: Chronic or frequent coughs; Shortness of Breath Medical History: Negative  for: Aspiration; Asthma; Chronic Obstructive Pulmonary Disease (COPD); Pneumothorax; Sleep Apnea; Tuberculosis Cardiovascular Complaints and Symptoms: Positive for: LE edema Negative for: Chest pain Medical History: Positive for: Congestive Heart Failure; Coronary Artery Disease; Hypertension Negative for: Angina; Arrhythmia; Deep Vein Thrombosis; Hypotension; Myocardial Infarction; Peripheral Arterial  Disease; Peripheral Venous Disease; Phlebitis; Vasculitis Psychiatric Complaints and Symptoms: Negative for: Anxiety; Claustrophobia Medical History: Negative for: Anorexia/bulimia; Confinement Anxiety Eyes Medical History: Negative for: Cataracts; Glaucoma; Optic Neuritis Ear/Nose/Mouth/Throat Medical History: Negative for: Chronic sinus problems/congestion; Middle ear problems Hematologic/Lymphatic Medical History: Negative for: Anemia; Hemophilia; Human Immunodeficiency Virus; Lymphedema; Sickle Cell Disease FEROL, LAICHE ([id number]) Gastrointestinal Medical History: Negative for: Cirrhosis ; Colitis; Crohnos; Hepatitis A; Hepatitis B; Hepatitis C Endocrine Medical History: Negative for: Type I Diabetes; Type II Diabetes Genitourinary Medical History: Negative for: End Stage Renal Disease Immunological Medical History: Negative for: Lupus Erythematosus; Raynaudos; Scleroderma Integumentary (Skin) Medical History: Negative for: History of Burn; History of pressure wounds Musculoskeletal Medical History: Negative for: Gout; Rheumatoid Arthritis; Osteoarthritis; Osteomyelitis Neurologic Medical History: Negative for: Dementia; Neuropathy; Quadriplegia; Paraplegia; Seizure Disorder Oncologic Medical History: Negative for: Received Chemotherapy; Received Radiation Immunizations Pneumococcal Vaccine: Received Pneumococcal Vaccination: No Implantable Devices Yes Family and Social History Cancer: Yes - Siblings; Diabetes: Yes - Father,Siblings; Heart Disease: Yes  - Mother,Siblings; Hereditary Spherocytosis: No; Hypertension: Yes - Mother; Kidney Disease: No; Lung Disease: Yes - Siblings; Seizures: No; Stroke: No; Thyroid Problems: No; Tuberculosis: No; Never smoker; Marital Status - Single; Alcohol Use: Never; Drug Use: No History; Caffeine Use: Never; Financial Concerns: No; Food, Clothing or Shelter Needs: No; Support System Lacking: No; Transportation Concerns: No Physician Affirmation I have reviewed and agree with the above information. Electronic Signature(s) REIGNA, RUPERTO ([id number]) Signed: 09/09/2018 6:57:28 PM By: Lenda Kelp PA-C Signed: 09/13/2018 4:33:06 PM By: Curtis Sites Entered By: Lenda Kelp on 09/09/2018 18:20:36 LIANNA, SITZMANN ([id number]) -------------------------------------------------------------------------------- SuperBill Details Patient Name: LENICE, KOPER. Date of Service: 09/09/2018 Medical Record Number: [id number] Patient Account Number: [id number] Date of Birth/Sex: 1945/12/17 (73 y.o. 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 N18.3 Chronic kidney disease, stage 3 (moderate) I50.42 Chronic combined systolic (congestive) and diastolic (congestive) heart failure I10 Essential (primary) hypertension I48.0 Paroxysmal atrial fibrillation Facility Procedures CPT4 Code: 62952841 Description: (Facility Use Only) [phone] - APPLY MULTLAY COMPRS LWR LT LEG Modifier: Quantity: 1 Physician Procedures CPT4 Code Description: [id number] - WC  PHYS LEVEL 4 - EST PT ICD-10 Diagnosis Description I87.2 Venous insufficiency (chronic) (peripheral) I89.0 Lymphedema, not elsewhere classified L97.812 Non-pressure chronic ulcer of other part of right lower leg  wi L97.821 Non-pressure chronic ulcer of other part of left lower leg lim Modifier: th fat layer expos ited to breakdown Quantity: 1 ed of skin Electronic Signature(s) Signed: 09/09/2018 6:22:42 PM By: Lenda Kelp PA-C Previous Signature: 09/09/2018 4:03:55 PM Version By: Curtis Sites Entered By: Lenda Kelp on 09/09/2018 18:22:42

## 2018-09-14 ENCOUNTER — Other Ambulatory Visit: Payer: Self-pay | Admitting: Internal Medicine

## 2018-09-16 ENCOUNTER — Other Ambulatory Visit: Payer: Self-pay

## 2018-09-16 DIAGNOSIS — L97812 Non-pressure chronic ulcer of other part of right lower leg with fat layer exposed: Secondary | ICD-10-CM | POA: Diagnosis not present

## 2018-09-16 NOTE — Progress Notes (Signed)
Erin [name], Erin [name] ([id number]) Visit Report for 09/16/2018 Arrival Information Details Patient Name: Erin [name], Erin [name]. Date of Service: 09/16/2018 1:00 PM Medical Record Number: [id number] Patient Account Number: [id number] Date of Birth/Sex: March 22, 1945 (73 y.o. F) Treating RN: Harold Barban Primary Care Chalsey Leeth: PATIENT, NO Other Clinician: Referring Amit Meloy: Referral, Self Treating Jeana Kersting/Extender: Erin [name], Erin [name] Weeks in Treatment: 8 Visit Information History Since Last Visit Added or deleted any medications: No Patient Arrived: Walker Any new allergies or adverse reactions: No Arrival Time: 13:03 Had a fall or experienced change in No Accompanied By: daughter activities of daily living that may affect Transfer Assistance: None risk of falls: Patient Identification Verified: Yes Signs or symptoms of abuse/neglect since last visito No Secondary Verification Process Completed: Yes Has Dressing in Place as Prescribed: Yes Has Compression in Place as Prescribed: Yes Pain Present Now: No Electronic Signature(s) Signed: 09/16/2018 1:46:59 PM By: Harold Barban Entered By: Harold Barban on 09/16/2018 13:07:21 Erin [name] ([id number]) -------------------------------------------------------------------------------- Compression Therapy Details Patient Name: Erin [name]. Date of Service: 09/16/2018 1:00 PM Medical Record Number: [id number] Patient Account Number: [id number] Date of Birth/Sex: July 13, 1945 (73 y.o. F) Treating RN: Harold Barban Primary Care Brenlynn Fake: PATIENT, NO Other Clinician: Referring Reagen Goates: Referral, Self Treating Arty Lantzy/Extender: Erin [name], Erin [name] Weeks in Treatment: 8 Compression Therapy Performed for Wound Assessment: Wound #4 Left,Anterior Lower Leg Performed By: Clinician Harold Barban, RN Compression Type: Three Layer Electronic Signature(s) Signed: 09/16/2018 1:46:59 PM By: Harold Barban Entered By: Harold Barban on 09/16/2018  13:14:11 Erin [name] ([id number]) -------------------------------------------------------------------------------- Encounter Discharge Information Details Patient Name: Erin [name], Erin [name]. Date of Service: 09/16/2018 1:00 PM Medical Record Number: [id number] Patient Account Number: [id number] Date of Birth/Sex: [birth year] (73 y.o. F) Treating RN: Harold Barban Primary Care Mihika Surrette: PATIENT, NO Other Clinician: Referring Alexandra Lipps: Referral, Self Treating Edla Para/Extender: Erin [name], Erin [name] Weeks in Treatment: 8 Encounter Discharge Information Items Discharge Condition: Stable Ambulatory Status: Walker Discharge Destination: Home Transportation: Private Auto Accompanied By: daughter Schedule Follow-up Appointment: Yes Clinical Summary of Care: Electronic Signature(s) Signed: 09/16/2018 1:46:59 PM By: Harold Barban Entered By: Harold Barban on 09/16/2018 13:16:31 [place], Placido Sou ([id number]) -------------------------------------------------------------------------------- Wound Assessment Details Patient Name: Erin [name]. Date of Service: 09/16/2018 1:00 PM Medical Record Number: [id number] Patient Account Number: [id number] Date of Birth/Sex: January 19, 1945 (73 y.o. F) Treating RN: Harold Barban Primary Care Jonathin Heinicke: PATIENT, NO Other Clinician: Referring Rylei Codispoti: Referral, Self Treating Chevonne Bostrom/Extender: Erin [name], Erin [name] Weeks in Treatment: 8 Wound Status Wound Number: 2 Primary Trauma, Other Etiology: Wound Location: Left Lower Leg - Lateral Wound Status: Open Wounding Event: Trauma Comorbid Congestive Heart Failure, Coronary Artery Date Acquired: 08/21/2018 History: Disease, Hypertension Weeks Of Treatment: 3 Clustered Wound: No Photos Wound Measurements Length: (cm) 7.6 % Reduction in Width: (cm) 8 % Reduction in Depth: (cm) 0.1 Epithelializat Area: (cm) 47.752 Volume: (cm) 4.775 Area: -70.1% Volume: -70% ion: None Wound  Description Classification: Partial Thickness Foul Odor Aft Exudate Amount: Medium Slough/Fibrin Exudate Type: Serosanguineous Exudate Color: red, brown er Cleansing: No o Yes Wound Bed Granulation Amount: Large (67-100%) Exposed Structure Granulation Quality: Red Fascia Exposed: No Necrotic Amount: Small (1-33%) Fat Layer (Subcutaneous Tissue) Exposed: Yes Necrotic Quality: Adherent Slough Tendon Exposed: No Muscle Exposed: No Joint Exposed: No Bone Exposed: No Treatment Notes Wound #2 (Left, Lateral Lower Leg) Erin [name], Erin [name] M. ([id number]) Notes L proximal Lateral and anterior - xeroform; L lateral- adaptic, xtrasorb and 3 layer wrap on left Electronic Signature(s) Signed: 09/16/2018 1:46:59 PM By: Harold Barban Entered By: Harold Barban on 09/16/2018  13:12:58 Erin [name], Erin [name] ([id number]) -------------------------------------------------------------------------------- Wound Assessment Details Patient Name: Erin [name], Erin [name]. Date of Service: 09/16/2018 1:00 PM Medical Record Number: [id number] Patient Account Number: [id number] Date of Birth/Sex: [birth year] (73 y.o. F) Treating RN: Arnette Norris Primary Care Salam Chesterfield: PATIENT, NO Other Clinician: Referring Cumi Sanagustin: Referral, Self Treating Erin [name]/Extender: Erin [name], Erin [name] Weeks in Treatment: 8 Wound Status Wound Number: 3 Primary Trauma, Other Etiology: Wound Location: Left Lower Leg - Lateral, Proximal Wound Status: Open Wounding Event: Trauma Comorbid Congestive Heart Failure, Coronary Artery Date Acquired: 08/21/2018 History: Disease, Hypertension Weeks Of Treatment: 3 Clustered Wound: No Photos Wound Measurements Length: (cm) 0.1 Width: (cm) 0.1 Depth: (cm) 0.1 Area: (cm) 0.008 Volume: (cm) 0.001 % Reduction in Area: 99.5% % Reduction in Volume: 99.4% Epithelialization: None Wound Description Classification: Partial Thickness Exudate Amount: Medium Exudate Type: Serosanguineous Exudate  Color: red, brown Foul Odor After Cleansing: No Slough/Fibrino Yes Wound Bed Granulation Amount: Large (67-100%) Exposed Structure Granulation Quality: Red Fascia Exposed: No Necrotic Amount: Small (1-33%) Fat Layer (Subcutaneous Tissue) Exposed: Yes Tendon Exposed: No Muscle Exposed: No Joint Exposed: No Bone Exposed: No Treatment Notes Wound #3 (Left, Proximal, Lateral Lower Leg) Karnes, [name] ([id number]) Notes L proximal Lateral and anterior - xeroform; L lateral- adaptic, xtrasorb and 3 layer wrap on left Electronic Signature(s) Signed: 09/16/2018 1:46:59 PM By: Arnette Norris Entered By: Arnette Norris on 09/16/2018 13:13:23 Erin [name], Erin [name] ([id number]) -------------------------------------------------------------------------------- Wound Assessment Details Patient Name: Erin [name]. Date of Service: 09/16/2018 1:00 PM Medical Record Number: [id number] Patient Account Number: [id number] Date of Birth/Sex: 1945/09/20 (73 y.o. F) Treating RN: Arnette Norris Primary Care Eyob Godlewski: PATIENT, NO Other Clinician: Referring Laurin Morgenstern: Referral, Self Treating Lovinia Snare/Extender: Erin [name], Erin [name] Weeks in Treatment: 8 Wound Status Wound Number: 4 Primary Venous Leg Ulcer Etiology: Wound Location: Left Lower Leg - Anterior Wound Status: Open Wounding Event: Blister Comorbid Congestive Heart Failure, Coronary Artery Date Acquired: 08/26/2018 History: Disease, Hypertension Weeks Of Treatment: 2 Clustered Wound: No Photos Wound Measurements Length: (cm) 0.1 % Reduction in Width: (cm) 0.1 % Reduction in Depth: (cm) 0.1 Epithelializati Area: (cm) 0.008 Volume: (cm) 0.001 Area: 99.2% Volume: 98.9% on: None Wound Description Classification: Partial Thickness Foul Odor Afte Exudate Amount: Medium Slough/Fibrino Exudate Type: Serosanguineous Exudate Color: red, brown r Cleansing: No Yes Wound Bed Granulation Amount: Medium (34-66%) Exposed  Structure Granulation Quality: Red Fascia Exposed: No Necrotic Amount: Medium (34-66%) Fat Layer (Subcutaneous Tissue) Exposed: Yes Necrotic Quality: Adherent Slough Tendon Exposed: No Muscle Exposed: No Joint Exposed: No Bone Exposed: No Treatment Notes Wound #4 (Left, Anterior Lower Leg) Erin [name], [name] ([id number]) Notes L proximal Lateral and anterior - xeroform; L lateral- adaptic, xtrasorb and 3 layer wrap on left Electronic Signature(s) Signed: 09/16/2018 1:46:59 PM By: Arnette Norris Entered By: Arnette Norris on 09/16/2018 13:13:46

## 2018-09-20 NOTE — Progress Notes (Signed)
Remote ICD transmission.   

## 2018-09-23 ENCOUNTER — Other Ambulatory Visit: Payer: Self-pay

## 2018-09-23 ENCOUNTER — Encounter: Payer: Medicare Other | Admitting: Physician Assistant

## 2018-09-23 DIAGNOSIS — L97812 Non-pressure chronic ulcer of other part of right lower leg with fat layer exposed: Secondary | ICD-10-CM | POA: Diagnosis not present

## 2018-09-23 NOTE — Progress Notes (Addendum)
FELICIE, BOUDWIN ([id number]) Visit Report for 09/23/2018 Chief Complaint Document Details Patient Name: Erin [name], Erin [name]. Date of Service: 09/23/2018 2:45 PM Medical Record Number: [id number] Patient Account Number: [id number] Date of Birth/Sex: 23-Jan-1945 (73 y.o. F) Treating RN: Curtis Sites Primary Care Provider: PATIENT, NO Other Clinician: Referring Provider: Referral, Self Treating Provider/Extender: Linwood Dibbles, HOYT Weeks in Treatment: 9 Information Obtained from: Patient Chief Complaint Right LE Ulcer Electronic Signature(s) Signed: 09/23/2018 3:14:43 PM By: Lenda Kelp PA-C Entered By: Lenda Kelp on 09/23/2018 15:14:43 Buice, Arlana Pouch ([id number]) -------------------------------------------------------------------------------- HPI Details Patient Name: Erin [name]. Date of Service: 09/23/2018 2:45 PM Medical Record Number: [id number] Patient Account Number: [id number] Date of Birth/Sex: 05-02-1945 (73 y.o. F) Treating RN: Curtis Sites Primary Care Provider: PATIENT, NO Other Clinician: Referring Provider: Referral, Self Treating Provider/Extender: STONE III, HOYT Weeks in Treatment: 9 History of Present Illness HPI Description: 07/22/18 patient presents today for initial evaluation regarding issues that she is having with wounds on the right lateral lower leg. The good news is that these actually appear to be showing signs of already feeling which is good news. There still a lot of maceration and drainage as well as edema noted at this point. This is something that from the edema standpoint has been going on for some time the patient obviously as lymphedema among issues with venous stasis as well. With that being said I do believe at this point that she's having some discomfort at the site fortunately there's no evidence of active infection at this time which is good news. The patient has been having issues as well it sounds like with something completely  separate in regard to pain she's unable to raise her legs and elevate them due to the fact that whenever she does she has a burning pain that goes into her right hip laterally and then runs down into her foot in times as well. This actually appears to be something emanating from her back which I discussed with her today as well I think that's completely separate from the chronic venous stasis and lymphedema which we are going to be managing at this point. No fevers, chills, nausea, or vomiting noted at this time. 07/29/18 on evaluation today patient appears to be doing better in regard to the overall wound size which is good news. With that being said she is still having a significant amount of pain some of this is directly related to be open wound and even to the fact that there might be a little bit of infection at this point. With that being said things are not looking too poorly at this point which is good news. I did obtain a culture today to test this further. With that being said with regard to the orthopedic referral this has been sent they have not heard from the office as of yet. Advised that they do not hear anything by Monday that they give the office a call. 08/05/2018 on evaluation today patient appears to be doing better with regard to her lower extremity ulcer. There is just a very small area that still has any opening remaining at this time which is excellent news. She has been tolerating the compression wrap without complication. I do believe this has been extremely beneficial for her which is excellent news. Again we have been utilizing a 3 layer compression. She was also placed on Cipro most recently I had to change her from doxycycline to Cipro due to the culture findings. I do not  see any evidence of more significant infection compared to last week. 08/12/2018 on evaluation today patient appears to be doing very well with regard to her right lower extremity ulcer. She has been  tolerating the dressing changes without complication. Fortunately there is no signs of infection at this time. Overall I am very pleased with the progress she is made the wound is definitely measuring smaller today compared to last visit. She did see Okey RegalCarol who is Dr. Ethelene Halamos [facility] which [facility] now [facility] in [place]. Nonetheless she did feel like the patient was having some component of back pain causing her leg pain and recommended physical therapy to start with. Pending how things progress they may consider injections in the future but really do not want to do that until her wound is healed the good news is the wound seems to be progressing quite nicely. 08/19/18 on evaluation today patient appears to be doing quite well with regard to her wound currently. She has been tolerating the dressing changes without complication. Fortunately there's no signs of active infection at this time. She has not started physical therapy as of yet. 08/26/2018 on evaluation today patient actually appears to be doing okay in regard to her right lower extremity ulcer although it does seem somewhat dry. In regard to left lower extremity she actually has a new blister area unfortunately that we are going to have to address today fortunately this does not appear to be too deep but unfortunately is can be something that we have to address as well from the standpoint of getting everything to heal. There is a little bit of erythema noted as well I may want to place her back on a short course of oral antibiotics. 09/02/18 on evaluation today patient appears to be doing better with regard to her ulcers at this point. In fact she is showing signs of improvement at several sites and this is great news. Fortunately there's no signs of active infection at this time. Nonetheless I feel like she is much closer to healing even after a very short time. No fevers, chills, nausea, or vomiting noted at this  time. Erin [name], [name] ([id number]) 09/09/2018 on evaluation today patient appears to be doing well with regard to her right lower extremity this in fact is completely healed today. With regard to her left lower extremity she seems to be doing much better although this is not completely healed. Fortunately there is no signs of active infection at this time which is great news. Overall feeling she is tolerating the wraps quite well. 09/23/2018 on evaluation today patient actually appears to be doing excellent. She has been tolerating the dressing changes without complication. Fortunately there is no signs of active infection at this time. No fevers, chills, nausea, vomiting, or diarrhea. In fact her wound really appears to be completely healed. Electronic Signature(s) Signed: 09/23/2018 3:27:15 PM By: Lenda KelpStone III, Hoyt PA-C Entered By: Lenda KelpStone III, Hoyt on 09/23/2018 15:27:14 Erin [name], [name] ([id number]) -------------------------------------------------------------------------------- Physical Exam Details Patient Name: Erin [name], [name] Date of Service: 09/23/2018 2:45 PM Medical Record Number: [id number] Patient Account Number: [id number] Date of Birth/Sex: 03/21/1945 (73 y.o. F) Treating RN: Curtis Sitesorthy, Joanna Primary Care Provider: PATIENT, NO Other Clinician: Referring Provider: Referral, Self Treating Provider/Extender: STONE III, HOYT Weeks in Treatment: 9 Constitutional Well-nourished and well-hydrated in no acute distress. Respiratory normal breathing without difficulty. Psychiatric this patient is able to make decisions and demonstrates good insight into disease process. Alert and Oriented x 3.  pleasant and cooperative. Notes Upon further inspection patient's wound bed showed signs of complete epithelialization which is great news she seems to be making wonderful progress. Overall I am very pleased with the appearance today. Her edema is also under good control she does have compression  stockings that she wears on a regular basis she did not bring her left one today but we can put Tubigrip on her until she can get home to put it on beginning tomorrow. Also recommended lotion for her to help with the dry skin. Electronic Signature(s) Signed: 09/23/2018 3:27:59 PM By: Worthy Keeler PA-C Entered By: Worthy Keeler on 09/23/2018 15:27:59 Erin [name], Erin [name] ([id number]) -------------------------------------------------------------------------------- Physician Orders Details Patient Name: REAGEN, GOATES. Date of Service: 09/23/2018 2:45 PM Medical Record Number: [id number] Patient Account Number: [id number] Date of Birth/Sex: November 22, 1945 (73 y.o. F) Treating RN: Montey Hora Primary Care Provider: PATIENT, NO Other Clinician: Referring Provider: Referral, Self Treating Provider/Extender: STONE III, HOYT Weeks in Treatment: 9 Verbal / Phone Orders: No Diagnosis Coding ICD-10 Coding Code Description I87.2 Venous insufficiency (chronic) (peripheral) I89.0 Lymphedema, not elsewhere classified L97.812 Non-pressure chronic ulcer of other part of right lower leg with fat layer exposed L97.821 Non-pressure chronic ulcer of other part of left lower leg limited to breakdown of skin N18.3 Chronic kidney disease, stage 3 (moderate) I50.42 Chronic combined systolic (congestive) and diastolic (congestive) heart failure I10 Essential (primary) hypertension I48.0 Paroxysmal atrial fibrillation Discharge From [facility] o Discharge from [place] wearing compression daily Electronic Signature(s) Signed: 09/23/2018 4:27:31 PM By: Montey Hora Signed: 09/23/2018 4:44:05 PM By: Worthy Keeler PA-C Entered By: Montey Hora on 09/23/2018 15:19:25 Erin [name], Erin [name] ([id number]) -------------------------------------------------------------------------------- Problem List Details Patient Name: Erin [name]. Date of Service: 09/23/2018 2:45 PM Medical Record  Number: [id number] Patient Account Number: [id number] Date of Birth/Sex: [birth year] (73 y.o. F) Treating RN: Montey Hora Primary Care Provider: PATIENT, NO Other Clinician: Referring Provider: Referral, Self Treating Provider/Extender: Melburn Hake, HOYT Weeks in Treatment: 9 Active Problems ICD-10 Evaluated Encounter Code Description Active Date Today Diagnosis I87.2 Venous insufficiency (chronic) (peripheral) 07/22/2018 No Yes I89.0 Lymphedema, not elsewhere classified 07/24/2018 No Yes L97.812 Non-pressure chronic ulcer of other part of right lower leg 07/22/2018 No Yes with fat layer exposed L97.821 Non-pressure chronic ulcer of other part of left lower leg 08/26/2018 No Yes limited to breakdown of skin N18.3 Chronic kidney disease, stage 3 (moderate) 07/22/2018 No Yes I50.42 Chronic combined systolic (congestive) and diastolic 2/40/9735 No Yes (congestive) heart failure I10 Essential (primary) hypertension 07/22/2018 No Yes I48.0 Paroxysmal atrial fibrillation 07/22/2018 No Yes Inactive Problems Resolved Problems Electronic Signature(s) Erin [name], Erin [name] ([id number]) Signed: 09/23/2018 3:14:30 PM By: Worthy Keeler PA-C Entered By: Worthy Keeler on 09/23/2018 15:14:29 Erin [name], Erin [name] ([id number]) -------------------------------------------------------------------------------- Progress Note Details Patient Name: Erin [name]. Date of Service: 09/23/2018 2:45 PM Medical Record Number: [id number] Patient Account Number: [id number] Date of Birth/Sex: 01-25-1945 (73 y.o. F) Treating RN: Montey Hora Primary Care Provider: PATIENT, NO Other Clinician: Referring Provider: Referral, Self Treating Provider/Extender: STONE III, HOYT Weeks in Treatment: 9 Subjective Chief Complaint Information obtained from Patient Right LE Ulcer History of Present Illness (HPI) 07/22/18 patient presents today for initial evaluation regarding issues that she is having with wounds on the right  lateral lower leg. The good news is that these actually appear to be showing signs of already feeling which is good news. There still a lot of maceration and drainage as well as edema noted  at this point. This is something that from the edema standpoint has been going on for some time the patient obviously as lymphedema among issues with venous stasis as well. With that being said I do believe at this point that she's having some discomfort at the site fortunately there's no evidence of active infection at this time which is good news. The patient has been having issues as well it sounds like with something completely separate in regard to pain she's unable to raise her legs and elevate them due to the fact that whenever she does she has a burning pain that goes into her right hip laterally and then runs down into her foot in times as well. This actually appears to be something emanating from her back which I discussed with her today as well I think that's completely separate from the chronic venous stasis and lymphedema which we are going to be managing at this point. No fevers, chills, nausea, or vomiting noted at this time. 07/29/18 on evaluation today patient appears to be doing better in regard to the overall wound size which is good news. With that being said she is still having a significant amount of pain some of this is directly related to be open wound and even to the fact that there might be a little bit of infection at this point. With that being said things are not looking too poorly at this point which is good news. I did obtain a culture today to test this further. With that being said with regard to the orthopedic referral this has been sent they have not heard from the office as of yet. Advised that they do not hear anything by Monday that they give the office a call. 08/05/2018 on evaluation today patient appears to be doing better with regard to her lower extremity ulcer. There is  just a very small area that still has any opening remaining at this time which is excellent news. She has been tolerating the compression wrap without complication. I do believe this has been extremely beneficial for her which is excellent news. Again we have been utilizing a 3 layer compression. She was also placed on Cipro most recently I had to change her from doxycycline to Cipro due to the culture findings. I do not see any evidence of more significant infection compared to last week. 08/12/2018 on evaluation today patient appears to be doing very well with regard to her right lower extremity ulcer. She has been tolerating the dressing changes without complication. Fortunately there is no signs of infection at this time. Overall I am very pleased with the progress she is made the wound is definitely measuring smaller today compared to last visit. She did see Okey Regal who is Dr. Ethelene Hal PA at [facility] which is now [facility] in [place]. Nonetheless she did feel like the patient was having some component of back pain causing her leg pain and recommended physical therapy to start with. Pending how things progress they may consider injections in the future but really do not want to do that until her wound is healed the good news is the wound seems to be progressing quite nicely. 08/19/18 on evaluation today patient appears to be doing quite well with regard to her wound currently. She has been tolerating the dressing changes without complication. Fortunately there's no signs of active infection at this time. She has not started physical therapy as of yet. 08/26/2018 on evaluation today patient actually appears to be doing  okay in regard to her right lower extremity ulcer although it does seem somewhat dry. In regard to left lower extremity she actually has a new blister area unfortunately that we are going to have to address today fortunately this does not appear to be too deep but  unfortunately is can be something that we have to address as well from the standpoint of getting everything to heal. There is a little bit of erythema noted as well I may want Erin [name], Erin [name]. ([id number]) to place her back on a short course of oral antibiotics. 09/02/18 on evaluation today patient appears to be doing better with regard to her ulcers at this point. In fact she is showing signs of improvement at several sites and this is great news. Fortunately there's no signs of active infection at this time. Nonetheless I feel like she is much closer to healing even after a very short time. No fevers, chills, nausea, or vomiting noted at this time. 09/09/2018 on evaluation today patient appears to be doing well with regard to her right lower extremity this in fact is completely healed today. With regard to her left lower extremity she seems to be doing much better although this is not completely healed. Fortunately there is no signs of active infection at this time which is great news. Overall feeling she is tolerating the wraps quite well. 09/23/2018 on evaluation today patient actually appears to be doing excellent. She has been tolerating the dressing changes without complication. Fortunately there is no signs of active infection at this time. No fevers, chills, nausea, vomiting, or diarrhea. In fact her wound really appears to be completely healed. Patient History Information obtained from Patient. Family History Cancer - Siblings, Diabetes - Father,Siblings, Heart Disease - Mother,Siblings, Hypertension - Mother, Lung Disease - Siblings, No family history of Hereditary Spherocytosis, Kidney Disease, Seizures, Stroke, Thyroid Problems, Tuberculosis. Social History Never smoker, Marital Status - Single, Alcohol Use - Never, Drug Use - No History, Caffeine Use - Never. Medical History Eyes Denies history of Cataracts, Glaucoma, Optic Neuritis Ear/Nose/Mouth/Throat Denies history of  Chronic sinus problems/congestion, Middle ear problems Hematologic/Lymphatic Denies history of Anemia, Hemophilia, Human Immunodeficiency Virus, Lymphedema, Sickle Cell Disease Respiratory Denies history of Aspiration, Asthma, Chronic Obstructive Pulmonary Disease (COPD), Pneumothorax, Sleep Apnea, Tuberculosis Cardiovascular Patient has history of Congestive Heart Failure, Coronary Artery Disease, Hypertension Denies history of Angina, Arrhythmia, Deep Vein Thrombosis, Hypotension, Myocardial Infarction, Peripheral Arterial Disease, Peripheral Venous Disease, Phlebitis, Vasculitis Gastrointestinal Denies history of Cirrhosis , Colitis, Crohn s, Hepatitis A, Hepatitis B, Hepatitis C Endocrine Denies history of Type I Diabetes, Type II Diabetes Genitourinary Denies history of End Stage Renal Disease Immunological Denies history of Lupus Erythematosus, Raynaud s, Scleroderma Integumentary (Skin) Denies history of History of Burn, History of pressure wounds Musculoskeletal Denies history of Gout, Rheumatoid Arthritis, Osteoarthritis, Osteomyelitis Neurologic Denies history of Dementia, Neuropathy, Quadriplegia, Paraplegia, Seizure Disorder Oncologic Denies history of Received Chemotherapy, Received Radiation Psychiatric Denies history of Anorexia/bulimia, Confinement Anxiety Erin [name], [name] ([id number]) Review of Systems (ROS) Constitutional Symptoms (General Health) Denies complaints or symptoms of Fatigue, Fever, Chills, Marked Weight Change. Respiratory Denies complaints or symptoms of Chronic or frequent coughs, Shortness of Breath. Cardiovascular Complains or has symptoms of LE edema. Denies complaints or symptoms of Chest pain. Psychiatric Denies complaints or symptoms of Anxiety, Claustrophobia. Objective Constitutional Well-nourished and well-hydrated in no acute distress. Vitals Time Taken: 3:00 PM, Height: 60 in, Weight: 213 lbs, BMI: 41.6, Temperature: 98.8 F,  Pulse: 84 bpm, Respiratory Rate:  16 breaths/min, Blood Pressure: 129/60 mmHg. Respiratory normal breathing without difficulty. Psychiatric this patient is able to make decisions and demonstrates good insight into disease process. Alert and Oriented x 3. pleasant and cooperative. General Notes: Upon further inspection patient's wound bed showed signs of complete epithelialization which is great news she seems to be making wonderful progress. Overall I am very pleased with the appearance today. Her edema is also under good control she does have compression stockings that she wears on a regular basis she did not bring her left one today but we can put Tubigrip on her until she can get home to put it on beginning tomorrow. Also recommended lotion for her to help with the dry skin. Integumentary (Hair, Skin) Wound #2 status is Healed - Epithelialized. Original cause of wound was Trauma. The wound is located on the Left,Lateral Lower Leg. The wound measures 0cm length x 0cm width x 0cm depth; 0cm^2 area and 0cm^3 volume. There is no tunneling or undermining noted. There is a none present amount of drainage noted. There is no granulation within the wound bed. There is a small (1-33%) amount of necrotic tissue within the wound bed including Adherent Slough. Wound #3 status is Healed - Epithelialized. Original cause of wound was Trauma. The wound is located on the Left,Proximal,Lateral Lower Leg. The wound measures 0cm length x 0cm width x 0cm depth; 0cm^2 area and 0cm^3 volume. There is no tunneling or undermining noted. There is a none present amount of drainage noted. There is no granulation within the wound bed. There is a small (1-33%) amount of necrotic tissue within the wound bed. Wound #4 status is Healed - Epithelialized. Original cause of wound was Blister. The wound is located on the Left,Anterior Lower Leg. The wound measures 0cm length x 0cm width x 0cm depth; 0cm^2 area and 0cm^3 volume.  There is no tunneling or undermining noted. There is a none present amount of drainage noted. There is no granulation within the wound bed. There is a medium (34-66%) amount of necrotic tissue within the wound bed including Adherent Slough. Erin [name], Sahar M. ([id number]) Assessment Active Problems ICD-10 Venous insufficiency (chronic) (peripheral) Lymphedema, not elsewhere classified Non-pressure chronic ulcer of other part of right lower leg with fat layer exposed Non-pressure chronic ulcer of other part of left lower leg limited to breakdown of skin Chronic kidney disease, stage 3 (moderate) Chronic combined systolic (congestive) and diastolic (congestive) heart failure Essential (primary) hypertension Paroxysmal atrial fibrillation [facility] Discharge from [facility] - Continue wearing compression daily 1. I would recommend that we discontinue wound care services at this time she is in agreement with that plan. 2. I am also going to recommend that we have her wear her compression stocking on a regular basis I think this will help both prevent this from reopening as well as prevent any future recurrence of such wounds. Overall we are very pleased in this regard both her and I with how things stand and how the compression is done with the wrap. Patient will follow-up as needed here in our office. Electronic Signature(s) Signed: 09/23/2018 3:28:34 PM By: Lenda KelpStone III, Hoyt PA-C Entered By: Lenda KelpStone III, Hoyt on 09/23/2018 15:28:34 Erin [name], Brendi M. ([id number]) -------------------------------------------------------------------------------- ROS/PFSH Details Patient Name: Erin [name], Amyrie M. Date of Service: 09/23/2018 2:45 PM Medical Record Number: [id number] Patient Account Number: [id number] Date of Birth/Sex: 11/15/1945 (73 y.o. F) Treating RN: Curtis Sitesorthy, Joanna Primary Care Provider: PATIENT, NO Other Clinician: Referring Provider: Referral, Self Treating  Provider/Extender: Linwood DibblesSTONE III, HOYT Weeks in Treatment: 9 Information Obtained From Patient Constitutional Symptoms (General Health) Complaints and Symptoms: Negative for: Fatigue; Fever; Chills; Marked Weight Change Respiratory Complaints and Symptoms: Negative for: Chronic or frequent coughs; Shortness of Breath Medical History: Negative for: Aspiration; Asthma; Chronic Obstructive Pulmonary Disease (COPD); Pneumothorax; Sleep Apnea; Tuberculosis Cardiovascular Complaints and Symptoms: Positive for: LE edema Negative for: Chest pain Medical History: Positive for: Congestive Heart Failure; Coronary Artery Disease; Hypertension Negative for: Angina; Arrhythmia; Deep Vein Thrombosis; Hypotension; Myocardial Infarction; Peripheral Arterial Disease; Peripheral Venous Disease; Phlebitis; Vasculitis Psychiatric Complaints and Symptoms: Negative for: Anxiety; Claustrophobia Medical History: Negative for: Anorexia/bulimia; Confinement Anxiety Eyes Medical History: Negative for: Cataracts; Glaucoma; Optic Neuritis Ear/Nose/Mouth/Throat Medical History: Negative for: Chronic sinus problems/congestion; Middle ear problems Hematologic/Lymphatic Medical History: Negative for: Anemia; Hemophilia; Human Immunodeficiency Virus; Lymphedema; Sickle Cell Disease Erin [name], Dajuana M. ([id number]) Gastrointestinal Medical History: Negative for: Cirrhosis ; Colitis; Crohnos; Hepatitis A; Hepatitis B; Hepatitis C Endocrine Medical History: Negative for: Type I Diabetes; Type II Diabetes Genitourinary Medical History: Negative for: End Stage Renal Disease Immunological Medical History: Negative for: Lupus Erythematosus; Raynaudos; Scleroderma Integumentary (Skin) Medical History: Negative for: History of Burn; History of pressure wounds Musculoskeletal Medical History: Negative for: Gout; Rheumatoid Arthritis; Osteoarthritis; Osteomyelitis Neurologic Medical History: Negative for:  Dementia; Neuropathy; Quadriplegia; Paraplegia; Seizure Disorder Oncologic Medical History: Negative for: Received Chemotherapy; Received Radiation Immunizations Pneumococcal Vaccine: Received Pneumococcal Vaccination: No Implantable Devices Yes Family and Social History Cancer: Yes - Siblings; Diabetes: Yes - Father,Siblings; Heart Disease: Yes - Mother,Siblings; Hereditary Spherocytosis: No; Hypertension: Yes - Mother; Kidney Disease: No; Lung Disease: Yes - Siblings; Seizures: No; Stroke: No; Thyroid Problems: No; Tuberculosis: No; Never smoker; Marital Status - Single; Alcohol Use: Never; Drug Use: No History; Caffeine Use: Never; Financial Concerns: No; Food, Clothing or Shelter Needs: No; Support System Lacking: No; Transportation Concerns: No Physician Affirmation I have reviewed and agree with the above information. Electronic Signature(s) Erin [name], Kanylah M. ([id number]) Signed: 09/23/2018 4:27:31 PM By: Curtis Sitesorthy, Joanna Signed: 09/23/2018 4:44:05 PM By: Lenda KelpStone III, Hoyt PA-C Entered By: Lenda KelpStone III, Hoyt on 09/23/2018 15:27:35 Erin [name], Niyonna M. ([id number]) -------------------------------------------------------------------------------- SuperBill Details Patient Name: Erin [name], Koreena M. Date of Service: 09/23/2018 Medical Record Number: [id number] Patient Account Number: [id number] Date of Birth/Sex: 11/15/1945 (73 y.o. 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 N18.3 Chronic kidney disease, stage 3 (moderate) I50.42  Chronic combined systolic (congestive) and diastolic (congestive) heart failure I10 Essential (primary) hypertension I48.0 Paroxysmal atrial fibrillation Physician Procedures CPT4 Code Description: [id number] - WC PHYS LEVEL 4 - EST PT ICD-10 Diagnosis Description I87.2 Venous insufficiency (chronic) (peripheral) I89.0 Lymphedema, not elsewhere classified L97.812 Non-pressure chronic ulcer of other part of right lower leg  wi L97.821 Non-pressure chronic ulcer of other part of left lower leg lim Modifier: th fat layer expos ited to breakdown Quantity: 1 ed of skin Electronic Signature(s) Signed: 09/23/2018 3:29:45 PM By: Lenda KelpStone III, Hoyt PA-C Entered By: Lenda KelpStone III, Hoyt on 09/23/2018 15:29:45

## 2018-09-27 NOTE — Progress Notes (Signed)
Erin, [name] ([id number]) Visit Report for 09/23/2018 Arrival Information Details Patient Name: Erin [name], Erin [name]. Date of Service: 09/23/2018 2:45 PM Medical Record Number: [id number] Patient Account Number: [id number] Date of Birth/Sex: 01/10/1945 (73 y.o. F) Treating RN: Erin [name] Primary Care Erin [name]: PATIENT, NO Other Clinician: Referring Erin [name]: Referral, Self Treating Erin [name]: STONE III, Erin [name] Weeks in Treatment: 9 Visit Information History Since Last Visit Added or deleted any medications: No Patient Arrived: Walker Any new allergies or adverse reactions: No Arrival Time: 14:59 Had a fall or experienced change in No Accompanied By: daughter activities of daily living that may affect Transfer Assistance: None risk of falls: Patient Identification Verified: Yes Signs or symptoms of abuse/neglect since last visito No Secondary Verification Process Completed: Yes Hospitalized since last visit: No Implantable device outside of the clinic excluding No cellular tissue based products placed in the center since last visit: Has Dressing in Place as Prescribed: Yes Pain Present Now: No Electronic Signature(s) Signed: 09/27/2018 1:07:49 PM By: Erin [name] RCP, RRT, CHT Entered By: Erin [name] on 09/23/2018 15:00:21 Erin [name] ([id number]) -------------------------------------------------------------------------------- Clinic Level of Care Assessment Details Patient Name: Erin [name], Erin [name]. Date of Service: 09/23/2018 2:45 PM Medical Record Number: [id number] Patient Account Number: [id number] Date of Birth/Sex: 23-Aug-1945 (73 y.o. F) Treating RN: Erin [name] Primary Care Erin [name]: PATIENT, NO Other Clinician: Referring Erin [name]: Referral, Self Treating Erin [name]: STONE III, Erin [name] Weeks in Treatment: 9 Clinic Level of Care Assessment Items TOOL 4 Quantity Score []  - Use when only an EandM is performed on  FOLLOW-UP visit 0 ASSESSMENTS - Nursing Assessment / Reassessment X - Reassessment of Co-morbidities (includes updates in patient status) 1 10 X- 1 5 Reassessment of Adherence to Treatment Plan ASSESSMENTS - Wound and Skin Assessment / Reassessment X - Simple Wound Assessment / Reassessment - one wound 1 5 []  - 0 Complex Wound Assessment / Reassessment - multiple wounds []  - 0 Dermatologic / Skin Assessment (not related to wound area) ASSESSMENTS - Focused Assessment X - Circumferential Edema Measurements - multi extremities 1 5 []  - 0 Nutritional Assessment / Counseling / Intervention X- 1 5 Lower Extremity Assessment (monofilament, tuning fork, pulses) []  - 0 Peripheral Arterial Disease Assessment (using hand held doppler) ASSESSMENTS - Ostomy and/or Continence Assessment and Care []  - Incontinence Assessment and Management 0 []  - 0 Ostomy Care Assessment and Management (repouching, etc.) PROCESS - Coordination of Care X - Simple Patient / Family Education for ongoing care 1 15 []  - 0 Complex (extensive) Patient / Family Education for ongoing care X- 1 10 Staff obtains Programmer, systems, Records, Test Results / Process Orders []  - 0 Staff telephones HHA, Nursing Homes / Clarify orders / etc []  - 0 Routine Transfer to another Facility (non-emergent condition) []  - 0 Routine Hospital Admission (non-emergent condition) []  - 0 New Admissions / Biomedical engineer / Ordering NPWT, Apligraf, etc. []  - 0 Emergency Hospital Admission (emergent condition) X- 1 10 Simple Discharge Coordination Erin, [name]. ([id number]) []  - 0 Complex (extensive) Discharge Coordination PROCESS - Special Needs []  - Pediatric / Minor Patient Management 0 []  - 0 Isolation Patient Management []  - 0 Hearing / Language / Visual special needs []  - 0 Assessment of Community assistance (transportation, D/C planning, etc.) []  - 0 Additional assistance / Altered mentation []  - 0 Support Surface(s)  Assessment (bed, cushion, seat, etc.) INTERVENTIONS - Wound Cleansing / Measurement X - Simple Wound Cleansing - one wound 1 5 []  - 0 Complex Wound Cleansing - multiple  wounds X- 1 5 Wound Imaging (photographs - any number of wounds) []  - 0 Wound Tracing (instead of photographs) X- 1 5 Simple Wound Measurement - one wound []  - 0 Complex Wound Measurement - multiple wounds INTERVENTIONS - Wound Dressings []  - Small Wound Dressing one or multiple wounds 0 []  - 0 Medium Wound Dressing one or multiple wounds []  - 0 Large Wound Dressing one or multiple wounds []  - 0 Application of Medications - topical []  - 0 Application of Medications - injection INTERVENTIONS - Miscellaneous []  - External ear exam 0 []  - 0 Specimen Collection (cultures, biopsies, blood, body fluids, etc.) []  - 0 Specimen(s) / Culture(s) sent or taken to Lab for analysis []  - 0 Patient Transfer (multiple staff / Nurse, adult / Similar devices) []  - 0 Simple Staple / Suture removal (25 or less) []  - 0 Complex Staple / Suture removal (26 or more) []  - 0 Hypo / Hyperglycemic Management (close monitor of Blood Glucose) []  - 0 Ankle / Brachial Index (ABI) - do not check if billed separately X- 1 5 Vital Signs Erin [name], [name] ([id number]) Has the patient been seen at the hospital within the last three years: Yes Total Score: 85 Level Of Care: New/Established - Level 3 Electronic Signature(s) Signed: 09/23/2018 4:27:31 PM By: Erin [name] Entered By: Erin [name] on 09/23/2018 15:29:08 Erin [name] ([id number]) -------------------------------------------------------------------------------- Encounter Discharge Information Details Patient Name: Erin [name]. Date of Service: 09/23/2018 2:45 PM Medical Record Number: [id number] Patient Account Number: [id number] Date of Birth/Sex: 11/17/1945 (73 y.o. F) Treating RN: Erin [name] Primary Care Erin [name]: PATIENT, NO Other Clinician: Referring  Erin [name]: Referral, Self Treating Erin [name]: STONE III, Erin [name] Weeks in Treatment: 9 Encounter Discharge Information Items Discharge Condition: Stable Ambulatory Status: Walker Discharge Destination: Home Transportation: Private Auto Accompanied By: daughter Schedule Follow-up Appointment: No Clinical Summary of Care: Electronic Signature(s) Signed: 09/23/2018 3:30:01 PM By: Erin [name] Entered By: Erin [name] on 09/23/2018 15:30:01 Erin [name] ([id number]) -------------------------------------------------------------------------------- Lower Extremity Assessment Details Patient Name: Erin [name]. Date of Service: 09/23/2018 2:45 PM Medical Record Number: [id number] Patient Account Number: [id number] Date of Birth/Sex: 03/06/1945 (74 y.o. F) Treating RN: Rodell Perna Primary Care An Lannan: PATIENT, NO Other Clinician: Referring Darly Massi: Referral, Self Treating Lien Lyman/Extender: STONE III, Erin [name] Weeks in Treatment: 9 Edema Assessment Assessed: [Left: No] [Right: No] Edema: [Left: N] [Right: o] Calf Left: Right: Point of Measurement: 30 cm From Medial Instep 31 cm cm Ankle Left: Right: Point of Measurement: 9 cm From Medial Instep 22 cm cm Vascular Assessment Pulses: Dorsalis Pedis Palpable: [Left:Yes] Electronic Signature(s) Signed: 09/23/2018 3:16:22 PM By: Rodell Perna Entered By: Rodell Perna on 09/23/2018 15:11:16 Erin [name] ([id number]) -------------------------------------------------------------------------------- Multi-Disciplinary Care Plan Details Patient Name: Erin [name]. Date of Service: 09/23/2018 2:45 PM Medical Record Number: [id number] Patient Account Number: [id number] Date of Birth/Sex: 02-04-1945 (73 y.o. F) Treating RN: Erin [name] Primary Care Angell Honse: PATIENT, NO Other Clinician: Referring Feather Berrie: Referral, Self Treating Sanjna Haskew/Extender: Linwood Dibbles, Erin [name] Weeks in Treatment: 9 Active Inactive Electronic  Signature(s) Signed: 09/23/2018 4:27:31 PM By: Erin [name] Entered By: Erin [name] on 09/23/2018 15:18:58 Erin [name] ([id number]) -------------------------------------------------------------------------------- Pain Assessment Details Patient Name: Erin [name]. Date of Service: 09/23/2018 2:45 PM Medical Record Number: [id number] Patient Account Number: [id number] Date of Birth/Sex: 1945-11-11 (73 y.o. F) Treating RN: Erin [name] Primary Care Noel Henandez: PATIENT, NO Other Clinician: Referring Breonia Kirstein: Referral, Self Treating Amberlynn Tempesta/Extender: STONE III, Erin [name] Weeks in Treatment: 9 Active Problems Location of Pain Severity and  Description of Pain Patient Has Paino No Site Locations Pain Management and Medication Current Pain Management: Electronic Signature(s) Signed: 09/23/2018 4:27:31 PM By: Erin [name] Signed: 09/27/2018 1:07:49 PM By: Dayton Martes RCP, RRT, CHT Entered By: Dayton Martes on 09/23/2018 15:00:30 Erin [name] ([id number]) -------------------------------------------------------------------------------- Patient/Caregiver Education Details Patient Name: Erin [name]. Date of Service: 09/23/2018 2:45 PM Medical Record Number: [id number] Patient Account Number: [id number] Date of Birth/Gender: Dec 24, 1945 (73 y.o. F) Treating RN: Erin [name] Primary Care Physician: PATIENT, NO Other Clinician: Referring Physician: Referral, Self Treating Physician/Extender: Linwood Dibbles, Erin [name] Weeks in Treatment: 9 Education Assessment Education Provided To: Patient and Caregiver Education Topics Provided Venous: Handouts: Other: ongoing compression Methods: Explain/Verbal Responses: State content correctly Electronic Signature(s) Signed: 09/23/2018 4:27:31 PM By: Erin [name] Entered By: Erin [name] on 09/23/2018 15:29:33 Erin [name], Erin [name]  ([id number]) -------------------------------------------------------------------------------- Wound Assessment Details Patient Name: Erin [name]. Date of Service: 09/23/2018 2:45 PM Medical Record Number: [id number] Patient Account Number: [id number] Date of Birth/Sex: [birth year] (73 y.o. F) Treating RN: Erin [name] Primary Care Mamye Bolds: PATIENT, NO Other Clinician: Referring Vana Arif: Referral, Self Treating Maahi Lannan/Extender: STONE III, Erin [name] Weeks in Treatment: 9 Wound Status Wound Number: 2 Primary Trauma, Other Etiology: Wound Location: Left, Lateral Lower Leg Wound Status: Healed - Epithelialized Wounding Event: Trauma Comorbid Congestive Heart Failure, Coronary Artery Date Acquired: 08/21/2018 History: Disease, Hypertension Weeks Of Treatment: 4 Clustered Wound: No Photos Wound Measurements Length: (cm) 0 % Red Width: (cm) 0 % Red Depth: (cm) 0 Epith Area: (cm) 0 Tunn Volume: (cm) 0 Unde uction in Area: 100% uction in Volume: 100% elialization: Large (67-100%) eling: No rmining: No Wound Description Classification: Partial Thickness Exudate Amount: None Present Foul Odor After Cleansing: No Slough/Fibrino Yes Wound Bed Granulation Amount: None Present (0%) Exposed Structure Necrotic Amount: Small (1-33%) Fascia Exposed: No Necrotic Quality: Adherent Slough Fat Layer (Subcutaneous Tissue) Exposed: No Tendon Exposed: No Muscle Exposed: No Joint Exposed: No Bone Exposed: No Electronic Signature(s) Signed: 09/23/2018 4:27:31 PM By: Erin [name] Previous Signature: 09/23/2018 3:16:22 PM Version By: Wayland Denis ([id number]) Entered By: Erin [name] on 09/23/2018 15:18:32 Erin [name], Erin [name] ([id number]) -------------------------------------------------------------------------------- Wound Assessment Details Patient Name: Erin [name]. Date of Service: 09/23/2018 2:45 PM Medical Record Number: [id number] Patient Account Number:  [id number] Date of Birth/Sex: 07/04/1945 (73 y.o. F) Treating RN: Erin [name] Primary Care Adely Facer: PATIENT, NO Other Clinician: Referring [place] Flett: Referral, Self Treating Bernard Donahoo/Extender: STONE III, Erin [name] Weeks in Treatment: 9 Wound Status Wound Number: 3 Primary Trauma, Other Etiology: Wound Location: Left, Proximal, Lateral Lower Leg Wound Status: Healed - Epithelialized Wounding Event: Trauma Comorbid Congestive Heart Failure, Coronary Artery Date Acquired: 08/21/2018 History: Disease, Hypertension Weeks Of Treatment: 4 Clustered Wound: No Photos Wound Measurements Length: (cm) Width: (cm) Depth: (cm) Area: (cm) Volume: (cm) 0 % Reduction in Area: 100% 0 % Reduction in Volume: 100% 0 Epithelialization: Large (67-100%) 0 Tunneling: No 0 Undermining: No Wound Description Classification: Partial Thickness Exudate Amount: None Present Foul Odor After Cleansing: No Slough/Fibrino Yes Wound Bed Granulation Amount: None Present (0%) Exposed Structure Necrotic Amount: Small (1-33%) Fascia Exposed: No Fat Layer (Subcutaneous Tissue) Exposed: No Tendon Exposed: No Muscle Exposed: No Joint Exposed: No Bone Exposed: No Electronic Signature(s) Signed: 09/23/2018 4:27:31 PM By: Erin [name] Previous Signature: 09/23/2018 3:16:22 PM Version By: Wayland Denis ([id number]) Entered By: Erin [name] on 09/23/2018 15:18:34 Erin [name] ([id number]) -------------------------------------------------------------------------------- Wound Assessment Details Patient Name: Erin [name]. Date of Service: 09/23/2018 2:45 PM Medical  Record Number: [id number] Patient Account Number: [id number] Date of Birth/Sex: 11/11/1945 [age] y.o. F) Treating RN: Erin Sitesorthy, Joanna Primary Care Jazleen Robeck: PATIENT, NO Other Clinician: Referring Tawona Filsinger: Referral, Self Treating Talor Cheema/Extender: STONE III, Erin [name] Weeks in Treatment: 9 Wound Status Wound Number: 4 Primary  Venous Leg Ulcer Etiology: Wound Location: Left, Anterior Lower Leg Wound Status: Healed - Epithelialized Wounding Event: Blister Comorbid Congestive Heart Failure, Coronary Artery Date Acquired: 08/26/2018 History: Disease, Hypertension Weeks Of Treatment: 3 Clustered Wound: No Photos Wound Measurements Length: (cm) 0 % Red Width: (cm) 0 % Red Depth: (cm) 0 Epith Area: (cm) 0 Tunn Volume: (cm) 0 Unde uction in Area: 100% uction in Volume: 100% elialization: Large (67-100%) eling: No rmining: No Wound Description Classification: Partial Thickness Exudate Amount: None Present Foul Odor After Cleansing: No Slough/Fibrino Yes Wound Bed Granulation Amount: None Present (0%) Exposed Structure Necrotic Amount: Medium (34-66%) Fascia Exposed: No Necrotic Quality: Adherent Slough Fat Layer (Subcutaneous Tissue) Exposed: No Tendon Exposed: No Muscle Exposed: No Joint Exposed: No Bone Exposed: No Electronic Signature(s) Signed: 09/23/2018 4:27:31 PM By: Erin Sitesorthy, Joanna Previous Signature: 09/23/2018 3:16:22 PM Version By: Wayland DenisScott, Dajea Dzikowski, Alahni M. ([id number]) Entered By: Erin Sitesorthy, Joanna on 09/23/2018 15:18:37 Erin [name], [name] ([id number]) -------------------------------------------------------------------------------- Vitals Details Patient Name: Erin [name], Maryjo M. Date of Service: 09/23/2018 2:45 PM Medical Record Number: [id number] Patient Account Number: [id number] Date of Birth/Sex: 03/29/1945 (73 y.o. F) Treating RN: Erin Sitesorthy, Joanna Primary Care Avaleen Brownley: PATIENT, NO Other Clinician: Referring Alizon Schmeling: Referral, Self Treating Gerrett Loman/Extender: STONE III, Erin [name] Weeks in Treatment: 9 Vital Signs Time Taken: 15:00 Temperature (F): 98.8 Height (in): 60 Pulse (bpm): 84 Weight (lbs): 213 Respiratory Rate (breaths/min): 16 Body Mass Index (BMI): 41.6 Blood Pressure (mmHg): 129/60 Reference Range: 80 - 120 mg / dl Electronic Signature(s) Signed: 09/27/2018 1:07:49  PM By: Dayton MartesWallace, RCP,RRT,CHT, Sallie RCP, RRT, CHT Entered By: Dayton MartesWallace, RCP,RRT,CHT, Sallie on 09/23/2018 15:00:56

## 2018-10-10 ENCOUNTER — Ambulatory Visit: Payer: Medicare Other | Admitting: Endocrinology

## 2018-10-14 ENCOUNTER — Other Ambulatory Visit: Payer: Self-pay | Admitting: Cardiovascular Disease

## 2018-10-14 ENCOUNTER — Telehealth: Payer: Self-pay | Admitting: Cardiovascular Disease

## 2018-10-14 NOTE — Telephone Encounter (Signed)
Returned the call to [place] at [facility]. Confirmed with her that the patient should be on both Plavix and Eliquis. Christy verbalized understanding and voiced appreciation for the call back.   Per patient 07/07/18 o/v with Christell Faith, PA 3. CAD involving the native coronary arteries without angina: No symptoms concerning for angina.  Remains on request and Plavix without symptoms of bleeding, presumably in the setting of overlapping stents in the LAD.  Check CBC as outlined above.  No plans for ischemic evaluation at this time.  5. PAF: Followed by EP.  Remains on Coreg and Eliquis.  No bleeding concerns.  Most recent CBC on 12/2017 demonstrated stable hemoglobin.

## 2018-10-14 NOTE — Telephone Encounter (Signed)
Pt c/o medication issue:  1. Name of Medication: Eliquis and Plavix  2. How are you currently taking this medication (dosage and times per day)?   3. Are you having a reaction (difficulty breathing--STAT)?   4. What is your medication issue? [place] calling, states patient is on Plavix but received a prescription request for Eliquis.  Patient states she should be on both but that is not a usual request.  Please call Alyse Low to clarify at [phone].

## 2018-11-30 ENCOUNTER — Other Ambulatory Visit: Payer: Self-pay | Admitting: Endocrinology

## 2018-12-06 ENCOUNTER — Ambulatory Visit (INDEPENDENT_AMBULATORY_CARE_PROVIDER_SITE_OTHER): Payer: Medicare Other | Admitting: *Deleted

## 2018-12-06 DIAGNOSIS — Z95810 Presence of automatic (implantable) cardiac defibrillator: Secondary | ICD-10-CM

## 2018-12-07 ENCOUNTER — Telehealth: Payer: Self-pay | Admitting: *Deleted

## 2018-12-07 LAB — CUP PACEART REMOTE DEVICE CHECK
Battery Remaining Longevity: 38 mo
Battery Voltage: 2.94 V
Brady Statistic AP VP Percent: 0 %
Brady Statistic AP VS Percent: 0 %
Brady Statistic AS VP Percent: 0.06 %
Brady Statistic AS VS Percent: 99.94 %
Brady Statistic RA Percent Paced: 0.01 %
Brady Statistic RV Percent Paced: 0.06 %
Date Time Interrogation Session: 20201202114514
HighPow Impedance: 65 Ohm
Implantable Lead Implant Date: 20140718
Implantable Lead Implant Date: 20140718
Implantable Lead Location: 753859
Implantable Lead Location: 753860
Implantable Lead Model: 5076
Implantable Lead Model: 6935
Implantable Pulse Generator Implant Date: 20140718
Lead Channel Impedance Value: 304 Ohm
Lead Channel Impedance Value: 342 Ohm
Lead Channel Impedance Value: 437 Ohm
Lead Channel Pacing Threshold Amplitude: 0.5 V
Lead Channel Pacing Threshold Amplitude: 0.75 V
Lead Channel Pacing Threshold Pulse Width: 0.4 ms
Lead Channel Pacing Threshold Pulse Width: 0.4 ms
Lead Channel Sensing Intrinsic Amplitude: 1.875 mV
Lead Channel Sensing Intrinsic Amplitude: 1.875 mV
Lead Channel Sensing Intrinsic Amplitude: 11.625 mV
Lead Channel Sensing Intrinsic Amplitude: 11.625 mV
Lead Channel Setting Pacing Amplitude: 2 V
Lead Channel Setting Pacing Amplitude: 2.5 V
Lead Channel Setting Pacing Pulse Width: 0.4 ms
Lead Channel Setting Sensing Sensitivity: 0.3 mV

## 2018-12-07 NOTE — Telephone Encounter (Signed)
Spoke with patient regarding scheduled transmission received 12/07/18. AT/AF burden now 8.1%, AF persistent since 11/30/18. V rates elevated at times. Also note high volume NSVT episodes (161 episodes since 09/07/18), some available episodes appear 1:1 per markers, others true NSVT. OptiVol suggests fluid accumulation since 10/20/18. Presenting rhythm AF/VS 82-142bpm.  Pt reports she feels about at her baseline, denies worsened SOB, palpitations, or chest discomfort. Some fatigue, but she has been doing a lot more than usual due to the holiday. Reports compliance with Eliquis and carvedilol. Amiodarone d/c 06/2018 due to elevated ALT. Offered AF Clinic appointment, but pt declined due to transportation issues. Prefers f/u in [place] office. ED precautions reviewed, advised I will route message to Dr. Rockey Situ and Dr. Caryl Comes for recommendations. Pt agreeable to plan, no further questions at this time.

## 2018-12-08 NOTE — Telephone Encounter (Signed)
Can we arrange for an OV with a provdier to arrange for DCCV Thanks SK

## 2018-12-09 NOTE — Telephone Encounter (Signed)
I called and spoke with the patient. She is scheduled to come in to the office on 12/13/18 at 11:20 am with Dr. Caryl Comes.

## 2018-12-13 ENCOUNTER — Encounter: Payer: Self-pay | Admitting: Internal Medicine

## 2018-12-13 ENCOUNTER — Other Ambulatory Visit: Payer: Self-pay

## 2018-12-13 ENCOUNTER — Ambulatory Visit (INDEPENDENT_AMBULATORY_CARE_PROVIDER_SITE_OTHER): Payer: Medicare Other | Admitting: Internal Medicine

## 2018-12-13 VITALS — BP 136/82 | HR 107 | Ht 60.0 in | Wt 211.8 lb

## 2018-12-13 DIAGNOSIS — I480 Paroxysmal atrial fibrillation: Secondary | ICD-10-CM

## 2018-12-13 DIAGNOSIS — Z95810 Presence of automatic (implantable) cardiac defibrillator: Secondary | ICD-10-CM

## 2018-12-13 DIAGNOSIS — I5022 Chronic systolic (congestive) heart failure: Secondary | ICD-10-CM

## 2018-12-13 DIAGNOSIS — Z79899 Other long term (current) drug therapy: Secondary | ICD-10-CM

## 2018-12-13 DIAGNOSIS — I472 Ventricular tachycardia, unspecified: Secondary | ICD-10-CM

## 2018-12-13 DIAGNOSIS — Z01812 Encounter for preprocedural laboratory examination: Secondary | ICD-10-CM

## 2018-12-13 DIAGNOSIS — I428 Other cardiomyopathies: Secondary | ICD-10-CM | POA: Diagnosis not present

## 2018-12-13 MED ORDER — LOSARTAN POTASSIUM 50 MG PO TABS: 50.0000 mg | ORAL_TABLET | Freq: Every day | ORAL | 6 refills | Status: DC

## 2018-12-13 MED ORDER — CARVEDILOL 12.5 MG PO TABS: 12.5000 mg | ORAL_TABLET | Freq: Two times a day (BID) | ORAL | 6 refills | Status: DC

## 2018-12-13 NOTE — Progress Notes (Signed)
Patient Care Team: Patient, No Pcp Per as PCP - General (General Practice) Delma Freeze, FNP as Nurse Practitioner (Cardiology) Duke Salvia, MD as Consulting Physician (Cardiology) Antonieta Iba, MD as Consulting Physician (Cardiology)   HPI  Erin [name] is a 72 y.o. female Seen in followup for ICD implanted in  the context of a nonischemic and ischemic cardiomyopathy.echocardiogram-- 5/14 EF 25%  She has chronic systolic heart failure related to  atrial fibrillation-paroxysmal. She took amiodarone and apixoban.  Amio was stopped 2/2 amio assoc hyperthyroidism--Rx with methimazole.. now levothyroxine Followed by Dr Everardo All            Date Cr Hgb TSH ALT  8/18   4.9 16  11/18    2.13   12/19  11.3 26>>6.04 (2/20)   5/20 1.35    56  7/20 1.49  0.68 52    DATE TEST EF   5/14 Echo  20-25%   8/16 Echo  25 % (44/2.1/35)m LAE  8/18 Echo  20-25%   8/18 Cath  T Cx///90% LAD>>DES  11/18 Echo   25-30%   She was hospitalized 8/18 following ICD discharge. The strips were reviewed. She had sustained ventricular tachycardia which failed with antitachycardia pacing. She also had nonsustained ventricular tachycardia below detection. Amiodarone re-initiated.  Stopped 6/20 because of elevated transaminases.  Repeats no change  Noted on remote monitoring to be in afib, previously without symptoms but now has noted increasing fatigue and dyspnea but without edema or abd distension    DAPT + apixoban; Now on Clopidigrel and apixoban   No bleeding     Thromboembolic risk factors ( age -76, HTN-1, Vasc disease -1, CHF-1, Gender-1) for a CHADSVASc Score of >=5   Past Medical History:  Diagnosis Date  . Anginal pain (HCC)   . Automatic implantable cardioverter-defibrillator in situ    a. s/p MDT ICD 07/2012; b. SN # [id number]   . CAD (coronary artery disease)    a. cath 2003: LM nl, mid LAD 50%, LCx nl, RCA nl b. L&RHC 08/17/2014 100% occluded mid LCx, 70% mid LAD with FFR  0.82. Medical therapy. CI 2.1. CO 4.01c. LHC 8/18: CTO LCx w/ R-L colatts, LAD s/p PCI/DES x 2  . Chronic systolic CHF (congestive heart failure) (HCC)    a. echo 2014: EF 25%, diffuse HK, LA moderately dilated, RA mildly dilated, PASP 38 mm Hg; b. echo 08/2014: EF 25-30%, diffuse HK, RWMA cannot be excluded, mild MR, mild biatrial enlargement, RV mildly dilated, wall thickness nl, pacer wire/catheter noted, mild to mod TR, PASP high nl, c. TTE 8/18: EF 25-30%, diffuse HK, GR1DD, mod MR, mod dilated LA, nl RV sys fxn, PASP 56  . Encounter for long-term (current) use of anticoagulants   . Exertional shortness of breath   . H/O medication noncompliance   . High cholesterol   . Hypertension   . NICM (nonischemic cardiomyopathy) (HCC)    a. s/p AICD 07/22/2012  . Obesity   . OSA on CPAP    "suppose to; not often" (07/22/2012)  . Other "heavy-for-dates" infants   . PAF (paroxysmal atrial fibrillation) (HCC)    a. on warfarin--> Eliquis 08/2014    Past Surgical History:  Procedure Laterality Date  . ABDOMINAL HYSTERECTOMY  ~ 1998   "laparoscopic" (07/22/2012)  . CARDIAC CATHETERIZATION  ? 1990's  . CARDIAC CATHETERIZATION N/A 08/17/2014   Procedure: Right/Left Heart Cath and Coronary Angiography;  Surgeon: Kathleene Hazel,  MD;  Location: [facility] INVASIVE CV LAB;  Service: Cardiovascular;  Laterality: N/A;  . CARDIAC DEFIBRILLATOR PLACEMENT  07/22/2012   dual-chamber ICD.  Marland Kitchen CORONARY STENT INTERVENTION N/A 08/24/2016   Procedure: CORONARY STENT INTERVENTION;  Surgeon: Iran Ouch, MD;  Location: [facility] INVASIVE CV LAB;  Service: Cardiovascular;  Laterality: N/A;  . IMPLANTABLE CARDIOVERTER DEFIBRILLATOR IMPLANT N/A 07/22/2012   Procedure: IMPLANTABLE CARDIOVERTER DEFIBRILLATOR IMPLANT;  Surgeon: Marinus Maw, MD;  Location: [facility] CATH LAB;  Service: Cardiovascular;  Laterality: N/A;  . LEFT HEART CATH AND CORONARY ANGIOGRAPHY N/A 08/24/2016   Procedure: LEFT HEART CATH AND CORONARY ANGIOGRAPHY;   Surgeon: Iran Ouch, MD;  Location: [facility] INVASIVE CV LAB;  Service: Cardiovascular;  Laterality: N/A;  . TEE WITHOUT CARDIOVERSION N/A 08/21/2014   Procedure: TRANSESOPHAGEAL ECHOCARDIOGRAM (TEE);  Surgeon: Chilton Si, MD;  Location: [facility] ENDOSCOPY;  Service: Cardiovascular;  Laterality: N/A;  . TUBAL LIGATION  ~ 1978    Current Outpatient Medications  Medication Sig Dispense Refill  . apixaban (ELIQUIS) 5 MG TABS tablet Take 1 tablet (5 mg total) by mouth 2 (two) times daily. 60 tablet 11  . atorvastatin (LIPITOR) 40 MG tablet TAKE 1 TABLET BY MOUTH ONCE DAILY AT  6  PM 90 tablet 2  . carvedilol (COREG) 3.125 MG tablet TAKE 1 TABLET BY MOUTH TWICE DAILY WITH MEALS 180 tablet 0  . clopidogrel (PLAVIX) 75 MG tablet Take 1 tablet by mouth once daily with breakfast 90 tablet 0  . levothyroxine (SYNTHROID) 88 MCG tablet TAKE 1 TABLET BY MOUTH ONCE DAILY BEFORE BREAKFAST 90 tablet 0  . losartan (COZAAR) 100 MG tablet Take 1 tablet (100 mg total) by mouth daily. 90 tablet 3  . spironolactone (ALDACTONE) 25 MG tablet Take 1/2 (one-half) tablet by mouth once daily 45 tablet 0  . torsemide (DEMADEX) 20 MG tablet Take 2 tablets (40 mg total) by mouth daily.     No current facility-administered medications for this visit.     No Known Allergies  Review of Systems negative except from HPI and PMH  Physical Exam BP 136/82 (BP Location: Left Arm, Patient Position: Sitting, Cuff Size: Normal)   Pulse (!) 107   Ht 5' (1.524 m)   Wt 211 lb 12 oz (96 kg)   SpO2 98%   BMI 41.35 kg/m  Well developed and Morbidly obese  in no acute distress HENT normal Neck supple with JVP-flat Clear Device pocket well healed; without hematoma or erythema.  There is no tethering  Regular rate and rhythm, no  murmur Abd-soft with active BS No Clubbing cyanosis  * edema Skin-warm and dry A & Oriented  Grossly normal sensory and motor function  ECG Afib 107 -/08/46 Low voltage     Assessment and   Plan Atrial fibrillation-persistent  Nonischemic cardiomyopathy  Coronary artery disease-2 vessel with recent stenting  Ventricular tachycardia-treated via device  Implantable defibrillator-[employer]  Treated hyperthyroidism  Congestive heart failure-chronic sys  Hypertransaminasemia   Now persistent atrial fib but she has not been taking her anticoagulation regularly.  Stressed the importance.  We will anticipate cardioversion in about 3 weeks.  Atrial fibrillation is associate with a rapid rate.  We will decrease her losartan 100--50 and increase her carvedilol 3.125--12.5.  I have asked to reach out to her PCP regarding the monitoring of her CPAP.  Amiodarone might be something we would like to use again.  We will recheck LFTs.  Further, her last delta TSH was from 6--0.6.  We will  recheck it today.  No ischemia.  Euvolemic.  We spent more than 50% of our >25 min visit in face to face counseling regarding the above

## 2018-12-13 NOTE — Patient Instructions (Addendum)
Medication Instructions:  - Your physician has recommended you make the following change in your medication:   1) Increase coreg (carvedilol) to 12.5 mg- take 1 tablet by mouth twice a day  2) Decrease cozaar (losartan) to 50 mg -take 1 tablet by mouth once a day  3) Make sure you are taking your eliquis twice a day  *If you need a refill on your cardiac medications before your next appointment, please call your pharmacy*  Lab Work: - Your physician recommends that you have lab work today: CMET/ TSH/ CBC  If you have labs (blood work) drawn today and your tests are completely normal, you will receive your results only by: Marland Kitchen MyChart Message (if you have MyChart) OR . A paper copy in the mail If you have any lab test that is abnormal or we need to change your treatment, we will call you to review the results.  Testing/Procedures: - Your physician has recommended that you have a Cardioversion (DCCV)- in 3 weeks. Electrical Cardioversion uses a jolt of electricity to your heart either through paddles or wired patches attached to your chest. This is a controlled, usually prescheduled, procedure. Defibrillation is done under light anesthesia in the hospital, and you usually go home the day of the procedure. This is done to get your heart back into a normal rhythm. You are not awake for the procedure. Please see the instruction sheet given to you today.  You are scheduled for a Cardioversion on ________________ with Dr.___________ Please arrive at the [place] [facility] at _________ a.m. on the day of your procedure.  DIET INSTRUCTIONS:  Nothing to eat or drink after midnight the night before your procedure         1) Labs: Pre- Procedure COVID swab- (pending the date of your cardioversion)  -- [facility] entrance (12:30 pm- 2:30 pm) -- Drive up testing only  2) Medications: You may take all of your regular medications the morning of your procedure unless listed below:  - hold  torsemide the morning of your procedure  3) Must have a responsible person to drive you home.  4) Bring a current list of your medications and current insurance cards.    If you have any questions after you get home, please call the office at [phone]   Follow-Up: At [facility], you and your health needs are our priority.  As part of our continuing mission to provide you with exceptional heart care, we have created designated Provider Care Teams.  These Care Teams include your primary Cardiologist (physician) and Advanced Practice Providers (APPs -  Physician Assistants and Nurse Practitioners) who all work together to provide you with the care you need, when you need it.  Your next appointment:   6 week(s)  The format for your next appointment:   In Person  Provider:   Virl Axe, MD  Other Instructions n/a

## 2018-12-14 LAB — CBC WITH DIFFERENTIAL/PLATELET
Basophils Absolute: 0.1 10*3/uL (ref 0.0–0.2)
Basos: 1 %
EOS (ABSOLUTE): 0.3 10*3/uL (ref 0.0–0.4)
Eos: 4 %
Hematocrit: 33.5 % — ABNORMAL LOW (ref 34.0–46.6)
Hemoglobin: 10.8 g/dL — ABNORMAL LOW (ref 11.1–15.9)
Immature Grans (Abs): 0 10*3/uL (ref 0.0–0.1)
Immature Granulocytes: 0 %
Lymphocytes Absolute: 2.8 10*3/uL (ref 0.7–3.1)
Lymphs: 34 %
MCH: 24.5 pg — ABNORMAL LOW (ref 26.6–33.0)
MCHC: 32.2 g/dL (ref 31.5–35.7)
MCV: 76 fL — ABNORMAL LOW (ref 79–97)
Monocytes Absolute: 1 10*3/uL — ABNORMAL HIGH (ref 0.1–0.9)
Monocytes: 12 %
Neutrophils Absolute: 4.2 10*3/uL (ref 1.4–7.0)
Neutrophils: 49 %
Platelets: 324 10*3/uL (ref 150–450)
RBC: 4.41 x10E6/uL (ref 3.77–5.28)
RDW: 15.1 % (ref 11.7–15.4)
WBC: 8.4 10*3/uL (ref 3.4–10.8)

## 2018-12-14 LAB — COMPREHENSIVE METABOLIC PANEL
ALT: 11 IU/L (ref 0–32)
AST: 20 IU/L (ref 0–40)
Albumin/Globulin Ratio: 1.5 (ref 1.2–2.2)
Albumin: 4.2 g/dL (ref 3.7–4.7)
Alkaline Phosphatase: 95 IU/L (ref 39–117)
BUN/Creatinine Ratio: 18 (ref 12–28)
BUN: 18 mg/dL (ref 8–27)
Bilirubin Total: 1.1 mg/dL (ref 0.0–1.2)
CO2: 24 mmol/L (ref 20–29)
Calcium: 9.1 mg/dL (ref 8.7–10.3)
Chloride: 99 mmol/L (ref 96–106)
Creatinine, Ser: 1.02 mg/dL — ABNORMAL HIGH (ref 0.57–1.00)
GFR calc Af Amer: 63 mL/min/{1.73_m2} (ref >59)
GFR calc non Af Amer: 55 mL/min/{1.73_m2} — ABNORMAL LOW (ref >59)
Globulin, Total: 2.8 g/dL (ref 1.5–4.5)
Glucose: 98 mg/dL (ref 65–99)
Potassium: 3.6 mmol/L (ref 3.5–5.2)
Sodium: 140 mmol/L (ref 134–144)
Total Protein: 7 g/dL (ref 6.0–8.5)

## 2018-12-14 LAB — TSH: TSH: 0.006 u[IU]/mL — ABNORMAL LOW (ref 0.450–4.500)

## 2018-12-15 ENCOUNTER — Other Ambulatory Visit: Payer: Self-pay | Admitting: Internal Medicine

## 2018-12-15 ENCOUNTER — Telehealth: Payer: Self-pay | Admitting: Endocrinology

## 2018-12-15 ENCOUNTER — Telehealth: Payer: Self-pay | Admitting: Internal Medicine

## 2018-12-15 DIAGNOSIS — Z01812 Encounter for preprocedural laboratory examination: Secondary | ICD-10-CM

## 2018-12-15 DIAGNOSIS — I480 Paroxysmal atrial fibrillation: Secondary | ICD-10-CM

## 2018-12-15 NOTE — Telephone Encounter (Signed)
please contact patient: F/u is overdue.  Please sched f/u next week--VV is OK.

## 2018-12-15 NOTE — Telephone Encounter (Signed)
Routing this message to front desk for scheduling purposes.

## 2018-12-15 NOTE — Telephone Encounter (Signed)
I called and spoke with the patient regarding her lab results from 12/13/18.  I had verbally discussed with Dr. Caryl Comes that the patient's TSH is 0.006. Per Dr. Caryl Comes, he spoke with Dr. Loanne Drilling and he advised to have the patient stop her levothyroxine.   Dr. Caryl Comes asked that I call the patient to advise her of this and let her know Dr. Caryl Comes will have a further discussion with Dr. Loanne Drilling in regards to the timing of her DCCV and her abnormal TSH.   I called and spoke with the patient and advised her of the above recommendations. She voices understanding to: 1) Stop levothyroxine 2) wait for a call back from me with further recommendations from Dr. Caryl Comes.  The patient voices understanding and is agreeable.

## 2018-12-15 NOTE — Telephone Encounter (Signed)
LMTCB to schedule appointment °

## 2018-12-19 NOTE — Telephone Encounter (Signed)
H  GM2U SEllison said she should be euthyroid within just a few weeks so we can continue our scheudled cardioversion but should check a TSH just before Thanks SK

## 2018-12-20 ENCOUNTER — Encounter: Payer: Self-pay | Admitting: *Deleted

## 2018-12-20 LAB — CUP PACEART INCLINIC DEVICE CHECK
Battery Remaining Longevity: 37 mo
Battery Voltage: 2.94 V
Brady Statistic AP VP Percent: 0 %
Brady Statistic AP VS Percent: 0 %
Brady Statistic AS VP Percent: 0.05 %
Brady Statistic AS VS Percent: 99.94 %
Brady Statistic RA Percent Paced: 0.01 %
Brady Statistic RV Percent Paced: 0.05 %
Date Time Interrogation Session: 20201208124123
HighPow Impedance: 62 Ohm
Implantable Lead Implant Date: 20140718
Implantable Lead Implant Date: 20140718
Implantable Lead Location: 753859
Implantable Lead Location: 753860
Implantable Lead Model: 5076
Implantable Lead Model: 6935
Implantable Pulse Generator Implant Date: 20140718
Lead Channel Impedance Value: 304 Ohm
Lead Channel Impedance Value: 380 Ohm
Lead Channel Impedance Value: 399 Ohm
Lead Channel Pacing Threshold Amplitude: 0.75 V
Lead Channel Pacing Threshold Pulse Width: 0.4 ms
Lead Channel Sensing Intrinsic Amplitude: 13 mV
Lead Channel Sensing Intrinsic Amplitude: 3 mV
Lead Channel Setting Pacing Amplitude: 2 V
Lead Channel Setting Pacing Amplitude: 2.5 V
Lead Channel Setting Pacing Pulse Width: 0.4 ms
Lead Channel Setting Sensing Sensitivity: 0.3 mV

## 2018-12-20 NOTE — Telephone Encounter (Signed)
I called and spoke with the patient.  I have advised her of the message I received from Dr. Caryl Comes in regards to scheduling her DCCV. The patient is agreeable to schedule this. I have advised her that it may be best to try to schedule this for the first week of January due to COVID testing that will need to be done and the holidays.  The patient is agreeable with having a DCCV on 1/7 with pre-procedure labs and a COVID swab done on 1/4.  She is aware I will call to schedule this and call back with details.  The patient voices understanding.

## 2018-12-20 NOTE — Telephone Encounter (Signed)
I called and spoke with the patient and advised her I would need to schedule her DCCV on Friday 01/13/2019 due to MD cath conference on Thursday. She was agreeable.  Verbal instructions as listed below were reviewed with the patient and then also printed and mailed to her.   You are scheduled for a Cardioversion on Friday 01/13/2019 with Dr. Mariah Milling.  Please arrive at the [facility] at 6:30 a.m. on the day of your procedure.  DIET INSTRUCTIONS:  Nothing to eat or drink after midnight the night prior to your procedure         1) Labs:   Pre procedure lab: - Tuesday 01/10/2019 (12:00 pm- 2:00 pm) - [facility] Entrance, 1st desk on the right to check in  Pre procedure COVID swab: - Tuesday 01/10/2019 (12:30 pm- 2:30 pm) - [facility], drive up testing  2) Medications:  You may take all of your medications the morning of your procedure with enough water to get them down safely unless listed below:  - hold lasix (furosemide) the morning - hold aldactone (spironolactone)   3) Must have a responsible person to drive you home.  4) Bring a current list of your medications and current insurance cards.    If you have any questions after you get home, please call the office at [phone] Sherri Rad, RN, BSN   Electrical Cardioversion   Electrical cardioversion is the delivery of a jolt of electricity to restore a normal rhythm to the heart. A rhythm that is too fast or is not regular keeps the heart from pumping well. In this procedure, sticky patches or metal paddles are placed on the chest to deliver electricity to the heart from a device. This procedure may be done in an emergency if:  There is low or no blood pressure as a result of the heart rhythm.  Normal rhythm must be restored as fast as possible to protect the brain and heart from further damage.  It may save a life. This procedure may also be done for irregular or fast heart rhythms that are not  immediately life-threatening. Tell a health care provider about:  Any allergies you have.  All medicines you are taking, including vitamins, herbs, eye drops, creams, and over-the-counter medicines.  Any problems you or family members have had with anesthetic medicines.  Any blood disorders you have.  Any surgeries you have had.  Any medical conditions you have.  Whether you are pregnant or may be pregnant. What are the risks? Generally, this is a safe procedure. However, problems may occur, including:  Allergic reactions to medicines.  A blood clot that breaks free and travels to other parts of your body.  The possible return of an abnormal heart rhythm within hours or days after the procedure.  Your heart stopping (cardiac arrest). This is rare. What happens before the procedure? Medicines  Your health care provider may have you start taking: ? Blood-thinning medicines (anticoagulants) so your blood does not clot as easily. ? Medicines may be given to help stabilize your heart rate and rhythm.  Ask your health care provider about changing or stopping your regular medicines. This is especially important if you are taking diabetes medicines or blood thinners. General instructions  Plan to have someone take you home from the hospital or clinic.  If you will be going home right after the procedure, plan to have someone with you for 24 hours.  Follow instructions from your health care provider  about eating or drinking restrictions. What happens during the procedure?  To lower your risk of infection: ? Your health care team will wash or sanitize their hands. ? Your skin will be washed with soap.  An IV tube will be inserted into one of your veins.  You will be given a medicine to help you relax (sedative).  Sticky patches (electrodes) or metal paddles may be placed on your chest.  An electrical shock will be delivered. The procedure may vary among health care  providers and hospitals. What happens after the procedure?    Your blood pressure, heart rate, breathing rate, and blood oxygen level will be monitored until the medicines you were given have worn off.  Do not drive for 24 hours if you were given a sedative.  Your heart rhythm will be watched to make sure it does not change. This information is not intended to replace advice given to you by your health care provider. Make sure you discuss any questions you have with your health care provider. Document Released: 12/12/2001 Document Revised: 12/04/2016 Document Reviewed: 06/28/2015 Elsevier Patient Education  2020 [employer].

## 2018-12-21 ENCOUNTER — Other Ambulatory Visit: Payer: Self-pay

## 2018-12-22 ENCOUNTER — Other Ambulatory Visit: Payer: Self-pay | Admitting: Cardiovascular Disease

## 2018-12-22 NOTE — Telephone Encounter (Signed)
Prescription refill request for Eliquis received.  Last office visit: Caryl Comes, 12/13/2018 Scr:  1.02, 12/13/2018 Age: 73 y.o. Weight: 96 kg  Prescription refill sent.

## 2018-12-22 NOTE — Telephone Encounter (Signed)
Refill Request.  

## 2018-12-22 NOTE — Telephone Encounter (Signed)
Patient is scheduled for appointment 12/23/18 at 10:30 a.m.

## 2018-12-23 ENCOUNTER — Encounter: Payer: Self-pay | Admitting: Endocrinology

## 2018-12-23 ENCOUNTER — Ambulatory Visit (INDEPENDENT_AMBULATORY_CARE_PROVIDER_SITE_OTHER): Payer: Medicare Other | Admitting: Endocrinology

## 2018-12-23 VITALS — BP 120/60 | HR 53 | Ht 60.0 in | Wt 217.0 lb

## 2018-12-23 DIAGNOSIS — E032 Hypothyroidism due to medicaments and other exogenous substances: Secondary | ICD-10-CM

## 2018-12-23 DIAGNOSIS — T462X1A Poisoning by other antidysrhythmic drugs, accidental (unintentional), initial encounter: Secondary | ICD-10-CM

## 2018-12-23 LAB — T3, FREE: T3, Free: 3.1 pg/mL (ref 2.3–4.2)

## 2018-12-23 LAB — T4, FREE: Free T4: 1.53 ng/dL (ref 0.60–1.60)

## 2018-12-23 LAB — TSH: TSH: 0.02 u[IU]/mL — ABNORMAL LOW (ref 0.35–4.50)

## 2018-12-23 MED ORDER — METHIMAZOLE 10 MG PO TABS: 20.0000 mg | ORAL_TABLET | Freq: Two times a day (BID) | ORAL | 0 refills | Status: DC

## 2018-12-23 NOTE — Patient Instructions (Addendum)
Please stay off the levothyroxine. Blood tests are requested for you today.  We'll let you know about the results.  Based on the results, I'll prescribe for you a pill to slow the thyroid, and recheck the blood tests in 10 days. If ever you have fever while taking methimazole, stop it and call us, even if the reason is obvious, because of the risk of a rare side-effect.   Please come back for a follow-up appointment on approx 01/11/19.

## 2018-12-23 NOTE — Progress Notes (Signed)
Subjective:    Patient ID: Erin [name], female    DOB: 1946-01-02, 73 y.o.   MRN: [id number]  HPI Pt returns for f/u of abnormal thyroid function (was dx'ed whlie on amiodarone--she has taken since 2014; in 2015, she had slightly elevated TSH; several measurements more recently were low; she has never had thyroid imaging; tapazole was resumed in 2018, but then stopped in late 2019, due to hypothyroidism; synthroid was started in early 2020).  Synthroid was stopped 1 week ago, due to low TSH.  pt states she feels no different, and well in general.  DC cardioversion is scheduled for 01/11/19.   Past Medical History:  Diagnosis Date  . Anginal pain (HCC)   . Automatic implantable cardioverter-defibrillator in situ    a. s/p MDT ICD 07/2012; b. SN # [id number]   . CAD (coronary artery disease)    a. cath 2003: LM nl, mid LAD 50%, LCx nl, RCA nl b. L&RHC 08/17/2014 100% occluded mid LCx, 70% mid LAD with FFR 0.82. Medical therapy. CI 2.1. CO 4.01c. LHC 8/18: CTO LCx w/ R-L colatts, LAD s/p PCI/DES x 2  . Chronic systolic CHF (congestive heart failure) (HCC)    a. echo 2014: EF 25%, diffuse HK, LA moderately dilated, RA mildly dilated, PASP 38 mm Hg; b. echo 08/2014: EF 25-30%, diffuse HK, RWMA cannot be excluded, mild MR, mild biatrial enlargement, RV mildly dilated, wall thickness nl, pacer wire/catheter noted, mild to mod TR, PASP high nl, c. TTE 8/18: EF 25-30%, diffuse HK, GR1DD, mod MR, mod dilated LA, nl RV sys fxn, PASP 56  . Encounter for long-term (current) use of anticoagulants   . Exertional shortness of breath   . H/O medication noncompliance   . High cholesterol   . Hypertension   . NICM (nonischemic cardiomyopathy) (HCC)    a. s/p AICD 07/22/2012  . Obesity   . OSA on CPAP    "suppose to; not often" (07/22/2012)  . Other "heavy-for-dates" infants   . PAF (paroxysmal atrial fibrillation) (HCC)    a. on warfarin--> Eliquis 08/2014    Past Surgical History:  Procedure Laterality  Date  . ABDOMINAL HYSTERECTOMY  ~ 1998   "laparoscopic" (07/22/2012)  . CARDIAC CATHETERIZATION  ? 1990's  . CARDIAC CATHETERIZATION N/A 08/17/2014   Procedure: Right/Left Heart Cath and Coronary Angiography;  Surgeon: Kathleene Hazel, MD;  Location: [facility] INVASIVE CV LAB;  Service: Cardiovascular;  Laterality: N/A;  . CARDIAC DEFIBRILLATOR PLACEMENT  07/22/2012   dual-chamber ICD.  Marland Kitchen CORONARY STENT INTERVENTION N/A 08/24/2016   Procedure: CORONARY STENT INTERVENTION;  Surgeon: Iran Ouch, MD;  Location: [facility] INVASIVE CV LAB;  Service: Cardiovascular;  Laterality: N/A;  . IMPLANTABLE CARDIOVERTER DEFIBRILLATOR IMPLANT N/A 07/22/2012   Procedure: IMPLANTABLE CARDIOVERTER DEFIBRILLATOR IMPLANT;  Surgeon: Marinus Maw, MD;  Location: [facility] CATH LAB;  Service: Cardiovascular;  Laterality: N/A;  . LEFT HEART CATH AND CORONARY ANGIOGRAPHY N/A 08/24/2016   Procedure: LEFT HEART CATH AND CORONARY ANGIOGRAPHY;  Surgeon: Iran Ouch, MD;  Location: [facility] INVASIVE CV LAB;  Service: Cardiovascular;  Laterality: N/A;  . TEE WITHOUT CARDIOVERSION N/A 08/21/2014   Procedure: TRANSESOPHAGEAL ECHOCARDIOGRAM (TEE);  Surgeon: Chilton Si, MD;  Location: [facility] ENDOSCOPY;  Service: Cardiovascular;  Laterality: N/A;  . TUBAL LIGATION  ~ 1978    Social History   Socioeconomic History  . Marital status: Single    Spouse name: Not on file  . Number of children: 3  . Years of education:  Not on file  . Highest education level: Not on file  Occupational History  . Occupation: Retired    Associate Professor: [employer]  Tobacco Use  . Smoking status: Never Smoker  . Smokeless tobacco: Never Used  Substance and Sexual Activity  . Alcohol use: No  . Drug use: No  . Sexual activity: Never  Other Topics Concern  . Not on file  Social History Narrative  . Not on file   Social Determinants of Health   Financial Resource Strain:   . Difficulty of Paying Living Expenses: Not on file  Food Insecurity:   .  Worried About Programme researcher, broadcasting/film/video in the Last Year: Not on file  . Ran Out of Food in the Last Year: Not on file  Transportation Needs:   . Lack of Transportation (Medical): Not on file  . Lack of Transportation (Non-Medical): Not on file  Physical Activity:   . Days of Exercise per Week: Not on file  . Minutes of Exercise per Session: Not on file  Stress:   . Feeling of Stress : Not on file  Social Connections:   . Frequency of Communication with Friends and Family: Not on file  . Frequency of Social Gatherings with Friends and Family: Not on file  . Attends Religious Services: Not on file  . Active Member of Clubs or Organizations: Not on file  . Attends Banker Meetings: Not on file  . Marital Status: Not on file  Intimate Partner Violence:   . Fear of Current or Ex-Partner: Not on file  . Emotionally Abused: Not on file  . Physically Abused: Not on file  . Sexually Abused: Not on file    Current Outpatient Medications on File Prior to Visit  Medication Sig Dispense Refill  . atorvastatin (LIPITOR) 40 MG tablet TAKE 1 TABLET BY MOUTH ONCE DAILY AT  6  PM 90 tablet 2  . carvedilol (COREG) 12.5 MG tablet Take 1 tablet (12.5 mg total) by mouth 2 (two) times daily. 60 tablet 6  . clopidogrel (PLAVIX) 75 MG tablet Take 1 tablet by mouth once daily with breakfast 90 tablet 0  . ELIQUIS 5 MG TABS tablet Take 1 tablet by mouth twice daily 60 tablet 5  . losartan (COZAAR) 50 MG tablet Take 1 tablet (50 mg total) by mouth daily. 30 tablet 6  . spironolactone (ALDACTONE) 25 MG tablet Take 1/2 (one-half) tablet by mouth once daily 45 tablet 0  . torsemide (DEMADEX) 20 MG tablet Take 2 tablets (40 mg total) by mouth daily.     No current facility-administered medications on file prior to visit.    No Known Allergies  Family History  Problem Relation Age of Onset  . Heart disease Mother   . Heart disease Father   . Lung disease Sister   . Diabetes Sister   . Heart  failure Brother   . Thyroid disease Neg Hx     BP 120/60 (BP Location: Left Arm, Patient Position: Sitting, Cuff Size: Large)   Pulse (!) 53   Ht 5' (1.524 m)   Wt 217 lb (98.4 kg)   SpO2 96%   BMI 42.38 kg/m    Review of Systems Denies fever    Objective:   Physical Exam VITAL SIGNS:  See vs page GENERAL: no distress NECK: There is no palpable thyroid enlargement.  No thyroid nodule is palpable.  No palpable lymphadenopathy at the anterior neck.   Lab Results  Component Value  Date   TSH 0.006 (L) 12/13/2018       Assessment & Plan:  Hypothyroidism, due to amiodarone: overreplaced.  She may have developed endogenous hyperthyroidism PAF: generally, I would like to give the discontinuation of synthroid more time.  However, given the timeline, i'll rx tapazole, and recheck labs in 10 days.

## 2018-12-24 ENCOUNTER — Other Ambulatory Visit: Payer: Self-pay | Admitting: Internal Medicine

## 2018-12-24 DIAGNOSIS — I4819 Other persistent atrial fibrillation: Secondary | ICD-10-CM

## 2018-12-26 ENCOUNTER — Other Ambulatory Visit: Payer: Self-pay

## 2018-12-26 ENCOUNTER — Telehealth: Payer: Self-pay

## 2018-12-26 DIAGNOSIS — T462X1A Poisoning by other antidysrhythmic drugs, accidental (unintentional), initial encounter: Secondary | ICD-10-CM

## 2018-12-26 DIAGNOSIS — E032 Hypothyroidism due to medicaments and other exogenous substances: Secondary | ICD-10-CM

## 2018-12-26 MED ORDER — METHIMAZOLE 10 MG PO TABS: 20.0000 mg | ORAL_TABLET | Freq: Two times a day (BID) | ORAL | 0 refills | Status: DC

## 2018-12-26 NOTE — Telephone Encounter (Signed)
-----   Message from Renato Shin, MD sent at 12/23/2018  5:27 PM EST ----- please contact patient: Thyroid is improved, but still high.  I have sent a prescription to your pharmacy, to slow it down.  Please redo the blood tests in 10 days.  I hope you feel well.

## 2018-12-26 NOTE — Telephone Encounter (Signed)
Lab results reviewed by Dr. Ellison. Called pt to inform about lab results as well as new orders. Using closed-loop communication, pt verbalized complete acceptance and understanding of all information provided. No further questions nor concerns were voiced at this time. 

## 2018-12-27 ENCOUNTER — Telehealth: Payer: Self-pay

## 2018-12-27 NOTE — Telephone Encounter (Signed)
I am happy to hear what they are thinking about, but I assume they mean amiodarone, which we are following

## 2018-12-27 NOTE — Telephone Encounter (Signed)
Received notification from [place] indicating drug interaction between Methimazole and an unnamed drug, "other drug pt is on". Routing this message to Dr. Loanne Drilling for him to review medication regimen and to determine safety for taking this medication.

## 2018-12-27 NOTE — Telephone Encounter (Signed)
Sent communication to [employer] indicating OKAY TO REFILL

## 2019-01-04 ENCOUNTER — Other Ambulatory Visit: Payer: Medicare Other

## 2019-01-09 ENCOUNTER — Other Ambulatory Visit: Payer: Self-pay | Admitting: Internal Medicine

## 2019-01-09 ENCOUNTER — Other Ambulatory Visit: Payer: Self-pay

## 2019-01-10 ENCOUNTER — Other Ambulatory Visit: Payer: Self-pay | Admitting: Cardiovascular Disease

## 2019-01-10 ENCOUNTER — Other Ambulatory Visit
Admission: RE | Admit: 2019-01-10 | Discharge: 2019-01-10 | Disposition: A | Discharge status: Home / Self Care | Payer: Medicare Other | Source: Ambulatory Visit | Attending: Internal Medicine | Admitting: Internal Medicine

## 2019-01-10 ENCOUNTER — Other Ambulatory Visit
Admission: RE | Admit: 2019-01-10 | Discharge: 2019-01-10 | Disposition: A | Discharge status: Home / Self Care | Payer: Medicare Other | Source: Ambulatory Visit | Attending: Cardiovascular Disease | Admitting: Cardiovascular Disease

## 2019-01-10 ENCOUNTER — Telehealth: Payer: Self-pay | Admitting: Internal Medicine

## 2019-01-10 DIAGNOSIS — Z20822 Contact with and (suspected) exposure to covid-19: Secondary | ICD-10-CM | POA: Diagnosis not present

## 2019-01-10 DIAGNOSIS — T462X1A Poisoning by other antidysrhythmic drugs, accidental (unintentional), initial encounter: Secondary | ICD-10-CM | POA: Diagnosis not present

## 2019-01-10 DIAGNOSIS — Z01812 Encounter for preprocedural laboratory examination: Secondary | ICD-10-CM | POA: Insufficient documentation

## 2019-01-10 DIAGNOSIS — I480 Paroxysmal atrial fibrillation: Secondary | ICD-10-CM

## 2019-01-10 DIAGNOSIS — E032 Hypothyroidism due to medicaments and other exogenous substances: Secondary | ICD-10-CM | POA: Insufficient documentation

## 2019-01-10 LAB — BASIC METABOLIC PANEL
Anion gap: 10 (ref 5–15)
BUN: 20 mg/dL (ref 8–23)
CO2: 25 mmol/L (ref 22–32)
Calcium: 8.8 mg/dL — ABNORMAL LOW (ref 8.9–10.3)
Chloride: 101 mmol/L (ref 98–111)
Creatinine, Ser: 1.09 mg/dL — ABNORMAL HIGH (ref 0.44–1.00)
GFR calc Af Amer: 58 mL/min — ABNORMAL LOW (ref >60)
GFR calc non Af Amer: 50 mL/min — ABNORMAL LOW (ref >60)
Glucose, Bld: 98 mg/dL (ref 70–99)
Potassium: 3.8 mmol/L (ref 3.5–5.1)
Sodium: 136 mmol/L (ref 135–145)

## 2019-01-10 LAB — CBC WITH DIFFERENTIAL/PLATELET
Abs Immature Granulocytes: 0.06 10*3/uL (ref 0.00–0.07)
Basophils Absolute: 0 10*3/uL (ref 0.0–0.1)
Basophils Relative: 1 %
Eosinophils Absolute: 0.3 10*3/uL (ref 0.0–0.5)
Eosinophils Relative: 4 %
HCT: 34.4 % — ABNORMAL LOW (ref 36.0–46.0)
Hemoglobin: 10.5 g/dL — ABNORMAL LOW (ref 12.0–15.0)
Immature Granulocytes: 1 %
Lymphocytes Relative: 28 %
Lymphs Abs: 2.1 10*3/uL (ref 0.7–4.0)
MCH: 22.7 pg — ABNORMAL LOW (ref 26.0–34.0)
MCHC: 30.5 g/dL (ref 30.0–36.0)
MCV: 74.3 fL — ABNORMAL LOW (ref 80.0–100.0)
Monocytes Absolute: 1.4 10*3/uL — ABNORMAL HIGH (ref 0.1–1.0)
Monocytes Relative: 18 %
Neutro Abs: 3.9 10*3/uL (ref 1.7–7.7)
Neutrophils Relative %: 48 %
Platelets: 285 10*3/uL (ref 150–400)
RBC: 4.63 MIL/uL (ref 3.87–5.11)
RDW: 15.9 % — ABNORMAL HIGH (ref 11.5–15.5)
WBC: 7.8 10*3/uL (ref 4.0–10.5)
nRBC: 0 % (ref 0.0–0.2)

## 2019-01-10 LAB — TSH: TSH: 0.013 u[IU]/mL — ABNORMAL LOW (ref 0.350–4.500)

## 2019-01-10 NOTE — Telephone Encounter (Signed)
This is a [place] pt 

## 2019-01-10 NOTE — Telephone Encounter (Signed)
Pt c/o swelling: STAT is pt has developed SOB within 24 hours  1) How much weight have you gained and in what time span? Not a significant weight gain   2) If swelling, where is the swelling located? In stomach   3) Are you currently taking a fluid pill? Yes   4) Are you currently SOB? Yes   5) Do you have a log of your daily weights (if so, list)?   6) Have you gained 3 pounds in a day or 5 pounds in a week?   7) Have you traveled recently?

## 2019-01-10 NOTE — Telephone Encounter (Signed)
I spoke with the patient. She states she has been having some mild increased abdominal swelling over the last week. Her weight has been trending up as well. She was 211 lbs on 12/8 in office. She states she weighs 220 lbs at home today.  She confirms she is taking torsemide 40 mg once daily in the morning. She is scheduled for a DCCV on 01/13/19, however a repeat TSH today shows her level is still low at 0.013. She is due to see Dr. Everardo All tomorrow.  I have advised the patient to take an extra torsemide 20 mg at lunch tomorrow and Thursday for her edema/ weight gain. She is aware I will review everything further with Dr. Graciela Husbands on Thursday and I will call her back then.  The patient voices understanding of the above and is agreeable.

## 2019-01-11 ENCOUNTER — Other Ambulatory Visit: Payer: Self-pay

## 2019-01-11 ENCOUNTER — Encounter: Payer: Self-pay | Admitting: Endocrinology

## 2019-01-11 ENCOUNTER — Ambulatory Visit (INDEPENDENT_AMBULATORY_CARE_PROVIDER_SITE_OTHER): Payer: Medicare Other | Admitting: Endocrinology

## 2019-01-11 DIAGNOSIS — E0590 Thyrotoxicosis, unspecified without thyrotoxic crisis or storm: Secondary | ICD-10-CM

## 2019-01-11 LAB — SARS CORONAVIRUS 2 (TAT 6-24 HRS): SARS Coronavirus 2: NEGATIVE

## 2019-01-11 LAB — T3, FREE: T3, Free: 3.9 pg/mL (ref 2.0–4.4)

## 2019-01-11 NOTE — Patient Instructions (Signed)
Please continue the same methimazole (2 pills, twice a day) If ever you have fever while taking methimazole, stop it and call us, even if the reason is obvious, because of the risk of a rare side-effect.  Please come back for a follow-up appointment in 2 weeks.

## 2019-01-11 NOTE — Progress Notes (Signed)
Subjective:    Patient ID: Erin [name], female    DOB: 1945/03/30, 74 y.o.   MRN: [id number]  HPI Pt returns for f/u of abnormal thyroid function (was dx'ed whlie on amiodarone--she has taken since 2014; in 2015, she had slightly elevated TSH; several measurements more recently were low; she has never had thyroid imaging; tapazole was resumed in 2018, but then stopped in late 2019, due to hypothyroidism; synthroid was started in early 2020; synthroid was stopped 12/20 due to low TSH; tapazole was started in preparation for DC cardioversion).  pt reports fatigue and doe.  DC cardioversion is scheduled for 01/13/19. Pt says she was only taking tapazole 10 mg qd for an uncertain number of days after it was rx'ed 12/22/18.   Past Medical History:  Diagnosis Date  . Anginal pain (HCC)   . Automatic implantable cardioverter-defibrillator in situ    a. s/p MDT ICD 07/2012; b. SN # [id number]   . CAD (coronary artery disease)    a. cath 2003: LM nl, mid LAD 50%, LCx nl, RCA nl b. L&RHC 08/17/2014 100% occluded mid LCx, 70% mid LAD with FFR 0.82. Medical therapy. CI 2.1. CO 4.01c. LHC 8/18: CTO LCx w/ R-L colatts, LAD s/p PCI/DES x 2  . Chronic systolic CHF (congestive heart failure) (HCC)    a. echo 2014: EF 25%, diffuse HK, LA moderately dilated, RA mildly dilated, PASP 38 mm Hg; b. echo 08/2014: EF 25-30%, diffuse HK, RWMA cannot be excluded, mild MR, mild biatrial enlargement, RV mildly dilated, wall thickness nl, pacer wire/catheter noted, mild to mod TR, PASP high nl, c. TTE 8/18: EF 25-30%, diffuse HK, GR1DD, mod MR, mod dilated LA, nl RV sys fxn, PASP 56  . Encounter for long-term (current) use of anticoagulants   . Exertional shortness of breath   . H/O medication noncompliance   . High cholesterol   . Hypertension   . NICM (nonischemic cardiomyopathy) (HCC)    a. s/p AICD 07/22/2012  . Obesity   . OSA on CPAP    "suppose to; not often" (07/22/2012)  . Other "heavy-for-dates" infants   .  PAF (paroxysmal atrial fibrillation) (HCC)    a. on warfarin--> Eliquis 08/2014    Past Surgical History:  Procedure Laterality Date  . ABDOMINAL HYSTERECTOMY  ~ 1998   "laparoscopic" (07/22/2012)  . CARDIAC CATHETERIZATION  ? 1990's  . CARDIAC CATHETERIZATION N/A 08/17/2014   Procedure: Right/Left Heart Cath and Coronary Angiography;  Surgeon: Kathleene Hazel, MD;  Location: [facility] INVASIVE CV LAB;  Service: Cardiovascular;  Laterality: N/A;  . CARDIAC DEFIBRILLATOR PLACEMENT  07/22/2012   dual-chamber ICD.  Marland Kitchen CORONARY STENT INTERVENTION N/A 08/24/2016   Procedure: CORONARY STENT INTERVENTION;  Surgeon: Iran Ouch, MD;  Location: [facility] INVASIVE CV LAB;  Service: Cardiovascular;  Laterality: N/A;  . IMPLANTABLE CARDIOVERTER DEFIBRILLATOR IMPLANT N/A 07/22/2012   Procedure: IMPLANTABLE CARDIOVERTER DEFIBRILLATOR IMPLANT;  Surgeon: Marinus Maw, MD;  Location: [facility] CATH LAB;  Service: Cardiovascular;  Laterality: N/A;  . LEFT HEART CATH AND CORONARY ANGIOGRAPHY N/A 08/24/2016   Procedure: LEFT HEART CATH AND CORONARY ANGIOGRAPHY;  Surgeon: Iran Ouch, MD;  Location: [facility] INVASIVE CV LAB;  Service: Cardiovascular;  Laterality: N/A;  . TEE WITHOUT CARDIOVERSION N/A 08/21/2014   Procedure: TRANSESOPHAGEAL ECHOCARDIOGRAM (TEE);  Surgeon: Chilton Si, MD;  Location: [facility] ENDOSCOPY;  Service: Cardiovascular;  Laterality: N/A;  . TUBAL LIGATION  ~ 1978    Social History   Socioeconomic History  . Marital  status: Single    Spouse name: Not on file  . Number of children: 3  . Years of education: Not on file  . Highest education level: Not on file  Occupational History  . Occupation: Retired    Associate Professor: [employer]  Tobacco Use  . Smoking status: Never Smoker  . Smokeless tobacco: Never Used  Substance and Sexual Activity  . Alcohol use: No  . Drug use: No  . Sexual activity: Never  Other Topics Concern  . Not on file  Social History Narrative  . Not on file   Social  Determinants of Health   Financial Resource Strain:   . Difficulty of Paying Living Expenses: Not on file  Food Insecurity:   . Worried About Programme researcher, broadcasting/film/video in the Last Year: Not on file  . Ran Out of Food in the Last Year: Not on file  Transportation Needs:   . Lack of Transportation (Medical): Not on file  . Lack of Transportation (Non-Medical): Not on file  Physical Activity:   . Days of Exercise per Week: Not on file  . Minutes of Exercise per Session: Not on file  Stress:   . Feeling of Stress : Not on file  Social Connections:   . Frequency of Communication with Friends and Family: Not on file  . Frequency of Social Gatherings with Friends and Family: Not on file  . Attends Religious Services: Not on file  . Active Member of Clubs or Organizations: Not on file  . Attends Banker Meetings: Not on file  . Marital Status: Not on file  Intimate Partner Violence:   . Fear of Current or Ex-Partner: Not on file  . Emotionally Abused: Not on file  . Physically Abused: Not on file  . Sexually Abused: Not on file    Current Outpatient Medications on File Prior to Visit  Medication Sig Dispense Refill  . atorvastatin (LIPITOR) 40 MG tablet TAKE 1 TABLET BY MOUTH ONCE DAILY AT  6PM 90 tablet 0  . carvedilol (COREG) 12.5 MG tablet Take 1 tablet (12.5 mg total) by mouth 2 (two) times daily. 60 tablet 6  . clopidogrel (PLAVIX) 75 MG tablet Take 1 tablet by mouth once daily with breakfast (Patient taking differently: Take 75 mg by mouth daily with breakfast. ) 90 tablet 0  . ELIQUIS 5 MG TABS tablet Take 1 tablet by mouth twice daily (Patient taking differently: Take 5 mg by mouth 2 (two) times daily. ) 60 tablet 5  . losartan (COZAAR) 50 MG tablet Take 1 tablet (50 mg total) by mouth daily. 30 tablet 6  . methimazole (TAPAZOLE) 10 MG tablet Take 2 tablets (20 mg total) by mouth 2 (two) times daily. MEDICATIONS REVIEWED. OKAY TO REFILL 120 tablet 0  . spironolactone  (ALDACTONE) 25 MG tablet Take 1/2 (one-half) tablet by mouth once daily (Patient taking differently: Take 12.5 mg by mouth daily. ) 45 tablet 0  . torsemide (DEMADEX) 20 MG tablet Take 2 tablets (40 mg total) by mouth daily.     No current facility-administered medications on file prior to visit.    No Known Allergies  Family History  Problem Relation Age of Onset  . Heart disease Mother   . Heart disease Father   . Lung disease Sister   . Diabetes Sister   . Heart failure Brother   . Thyroid disease Neg Hx     BP 106/68 (BP Location: Right Arm, Patient Position: Sitting, Cuff Size:  Large)   Pulse 95   Ht 5' (1.524 m)   Wt 217 lb 9.6 oz (98.7 kg)   SpO2 96%   BMI 42.50 kg/m    Review of Systems Denies fever.      Objective:   Physical Exam VITAL SIGNS:  See vs page GENERAL: no distress NECK: There is no palpable thyroid enlargement.  No thyroid nodule is palpable.  No palpable lymphadenopathy at the anterior neck.        Assessment & Plan:  Hyperthyroidism: T3 is not improved despite rx for methimazole.  Question is she taking amiodarone at all.  Amiodarone rx: in this setting, it is more difficult to interpret TFT in the short term.  AF: despite the above, I agree with the plan for DC cardioversion, because methimazole rx is still best available rx option for thyroid.   Patient Instructions  Please continue the same methimazole (2 pills, twice a day) If ever you have fever while taking methimazole, stop it and call us, even if the reason is obvious, because of the risk of a rare side-effect.  Please come back for a follow-up appointment in 2 weeks.

## 2019-01-12 ENCOUNTER — Other Ambulatory Visit: Payer: Self-pay | Admitting: Internal Medicine

## 2019-01-12 DIAGNOSIS — E0590 Thyrotoxicosis, unspecified without thyrotoxic crisis or storm: Secondary | ICD-10-CM | POA: Insufficient documentation

## 2019-01-12 NOTE — Telephone Encounter (Signed)
I spoke with the patient. I advised her I spoke with Dr. Graciela Husbands in relation to her abnormal TSH and her pending DCCV for tomorrow. Per Dr. Graciela Husbands, we will proceed with the DCCV as scheduled.  I have notified the patient of this. I inquired how her fluid status is after taking an extra torsemide yesterday and today. Per the patient, she did not weigh this morning, but her weight at Dr. George Hugh was 217 lbs yesterday.   I have advised the patient to hold her torsemide & aldactone in the AM prior to her DCCV. She is aware she may take these once she gets home.  I have also advised her to continue her regular dose of torsemide 40 mg once daily over the weekend, but to weigh herself daily and call us on Monday if weights are trending up again.  The patient voices understanding and is agreeable.

## 2019-01-13 ENCOUNTER — Encounter: Payer: Self-pay | Admitting: Cardiovascular Disease

## 2019-01-13 ENCOUNTER — Encounter
Admission: RE | Disposition: A | Discharge status: Home / Self Care | Payer: Medicare Other | Source: Home / Self Care | Attending: Cardiovascular Disease

## 2019-01-13 ENCOUNTER — Ambulatory Visit
Admission: RE | Admit: 2019-01-13 | Discharge: 2019-01-13 | Disposition: A | Discharge status: Home / Self Care | Payer: Medicare Other | Attending: Cardiovascular Disease | Admitting: Cardiovascular Disease

## 2019-01-13 ENCOUNTER — Ambulatory Visit: Payer: Medicare Other | Admitting: Registered Nurse

## 2019-01-13 ENCOUNTER — Other Ambulatory Visit: Payer: Self-pay

## 2019-01-13 DIAGNOSIS — I25119 Atherosclerotic heart disease of native coronary artery with unspecified angina pectoris: Secondary | ICD-10-CM | POA: Diagnosis not present

## 2019-01-13 DIAGNOSIS — I491 Atrial premature depolarization: Secondary | ICD-10-CM | POA: Insufficient documentation

## 2019-01-13 DIAGNOSIS — Z6841 Body Mass Index (BMI) 40.0 and over, adult: Secondary | ICD-10-CM | POA: Diagnosis not present

## 2019-01-13 DIAGNOSIS — I428 Other cardiomyopathies: Secondary | ICD-10-CM | POA: Insufficient documentation

## 2019-01-13 DIAGNOSIS — I110 Hypertensive heart disease with heart failure: Secondary | ICD-10-CM | POA: Insufficient documentation

## 2019-01-13 DIAGNOSIS — Z95810 Presence of automatic (implantable) cardiac defibrillator: Secondary | ICD-10-CM | POA: Diagnosis not present

## 2019-01-13 DIAGNOSIS — I4819 Other persistent atrial fibrillation: Secondary | ICD-10-CM | POA: Diagnosis not present

## 2019-01-13 DIAGNOSIS — I5022 Chronic systolic (congestive) heart failure: Secondary | ICD-10-CM | POA: Insufficient documentation

## 2019-01-13 DIAGNOSIS — Z7901 Long term (current) use of anticoagulants: Secondary | ICD-10-CM | POA: Insufficient documentation

## 2019-01-13 DIAGNOSIS — G4730 Sleep apnea, unspecified: Secondary | ICD-10-CM | POA: Diagnosis not present

## 2019-01-13 DIAGNOSIS — I4891 Unspecified atrial fibrillation: Secondary | ICD-10-CM | POA: Diagnosis present

## 2019-01-13 DIAGNOSIS — R0602 Shortness of breath: Secondary | ICD-10-CM | POA: Diagnosis not present

## 2019-01-13 DIAGNOSIS — G4733 Obstructive sleep apnea (adult) (pediatric): Secondary | ICD-10-CM | POA: Insufficient documentation

## 2019-01-13 DIAGNOSIS — I480 Paroxysmal atrial fibrillation: Secondary | ICD-10-CM | POA: Diagnosis not present

## 2019-01-13 HISTORY — PX: CARDIOVERSION: SHX1299

## 2019-01-13 SURGERY — CARDIOVERSION
Anesthesia: General

## 2019-01-13 MED ORDER — SODIUM CHLORIDE 0.9 % IV SOLN: INTRAVENOUS | Status: DC

## 2019-01-13 MED ORDER — PROPOFOL 10 MG/ML IV BOLUS
INTRAVENOUS | Status: AC
  Filled 2019-01-13: qty 20

## 2019-01-13 MED ORDER — PROPOFOL 10 MG/ML IV BOLUS
INTRAVENOUS | Status: DC | PRN
  Administered 2019-01-13: 80 mg via INTRAVENOUS

## 2019-01-13 NOTE — Anesthesia Procedure Notes (Signed)
Performed by: Acea Yagi, CRNA Pre-anesthesia Checklist: Patient identified, Emergency Drugs available, Suction available and Patient being monitored Patient Re-evaluated:Patient Re-evaluated prior to induction Oxygen Delivery Method: Nasal cannula Induction Type: IV induction Dental Injury: Teeth and Oropharynx as per pre-operative assessment  Comments: Nasal cannula with etCO2 monitoring       

## 2019-01-13 NOTE — H&P (Signed)
H&P Addendum, pre-cardioversion  Patient was seen and evaluated prior to -cardioversion procedure Symptoms, prior testing details again confirmed with the patient Patient examined, no significant change from prior exam Lab work reviewed in detail personally by myself Patient understands risk and benefit of the procedure, willing to proceed  Signed, Tim Rendi Mapel, MD, Ph.D [facility]  

## 2019-01-13 NOTE — Anesthesia Postprocedure Evaluation (Signed)
Anesthesia Post Note  Patient: Erin [name]  Procedure(s) Performed: CARDIOVERSION (N/A )  Patient location during evaluation: Specials Recovery Anesthesia Type: General Level of consciousness: awake and alert Pain management: pain level controlled Vital Signs Assessment: post-procedure vital signs reviewed and stable Respiratory status: spontaneous breathing, nonlabored ventilation, respiratory function stable and patient connected to nasal cannula oxygen Cardiovascular status: blood pressure returned to baseline and stable Postop Assessment: no apparent nausea or vomiting Anesthetic complications: no     Last Vitals:  Vitals:   01/13/19 0800 01/13/19 0815  BP: (!) 91/58 (!) 91/56  Pulse: 76 77  Resp: (!) 26 (!) 23  Temp:    SpO2: 95% 94%    Last Pain:  Vitals:   01/13/19 0815  TempSrc:   PainSc: 0-No pain                 Lenard Simmer

## 2019-01-13 NOTE — Anesthesia Preprocedure Evaluation (Signed)
Anesthesia Evaluation  Patient identified by MRN, date of birth, ID band Patient awake    Reviewed: Allergy & Precautions, H&P , NPO status , Patient's Chart, lab work & pertinent test results, reviewed documented beta blocker date and time   History of Anesthesia Complications Negative for: history of anesthetic complications  Airway Mallampati: II  TM Distance: >3 FB Neck ROM: full    Dental  (+) Dental Advidsory Given, Upper Dentures, Lower Dentures   Pulmonary shortness of breath (with a fib), sleep apnea and Continuous Positive Airway Pressure Ventilation , neg COPD, neg recent URI,    Pulmonary exam normal        Cardiovascular Exercise Tolerance: Good hypertension, (-) angina+ CAD, + Cardiac Stents and +CHF  (-) Past MI and (-) CABG + dysrhythmias Atrial Fibrillation + Cardiac Defibrillator (-) Valvular Problems/Murmurs Rate:Abnormal     Neuro/Psych negative neurological ROS  negative psych ROS   GI/Hepatic negative GI ROS, Neg liver ROS,   Endo/Other  neg diabetesHyperthyroidism Morbid obesity  Renal/GU CRFRenal disease  negative genitourinary   Musculoskeletal   Abdominal   Peds  Hematology negative hematology ROS (+)   Anesthesia Other Findings Past Medical History: No date: Anginal pain (HCC) No date: Automatic implantable cardioverter-defibrillator in situ     Comment:  a. s/p MDT ICD 07/2012; b. SN # [id number]  No date: CAD (coronary artery disease)     Comment:  a. cath 2003: LM nl, mid LAD 50%, LCx nl, RCA nl b.               L&RHC 08/17/2014 100% occluded mid LCx, 70% mid LAD with               FFR 0.82. Medical therapy. CI 2.1. CO 4.01c. LHC 8/18:               CTO LCx w/ R-L colatts, LAD s/p PCI/DES x 2 No date: Chronic systolic CHF (congestive heart failure) (HCC)     Comment:  a. echo 2014: EF 25%, diffuse HK, LA moderately dilated,              RA mildly dilated, PASP 38 mm Hg; b. echo  08/2014: EF               25-30%, diffuse HK, RWMA cannot be excluded, mild MR,               mild biatrial enlargement, RV mildly dilated, wall               thickness nl, pacer wire/catheter noted, mild to mod TR,               PASP high nl, c. TTE 8/18: EF 25-30%, diffuse HK, GR1DD,               mod MR, mod dilated LA, nl RV sys fxn, PASP 56 No date: Encounter for long-term (current) use of anticoagulants No date: Exertional shortness of breath No date: H/O medication noncompliance No date: High cholesterol No date: Hypertension No date: NICM (nonischemic cardiomyopathy) (HCC)     Comment:  a. s/p AICD 07/22/2012 No date: Obesity No date: OSA on CPAP     Comment:  "suppose to; not often" (07/22/2012) No date: Other "heavy-for-dates" infants No date: PAF (paroxysmal atrial fibrillation) (HCC)     Comment:  a. on warfarin--> Eliquis 08/2014   Reproductive/Obstetrics negative OB ROS  Anesthesia Physical Anesthesia Plan  ASA: III  Anesthesia Plan: General   Post-op Pain Management:    Induction: Intravenous  PONV Risk Score and Plan: 3 and Propofol infusion and TIVA  Airway Management Planned: Natural Airway and Nasal Cannula  Additional Equipment:   Intra-op Plan:   Post-operative Plan:   Informed Consent: I have reviewed the patients History and Physical, chart, labs and discussed the procedure including the risks, benefits and alternatives for the proposed anesthesia with the patient or authorized representative who has indicated his/her understanding and acceptance.     Dental Advisory Given  Plan Discussed with: Anesthesiologist, CRNA and Surgeon  Anesthesia Plan Comments:         Anesthesia Quick Evaluation

## 2019-01-13 NOTE — CV Procedure (Signed)
Cardioversion procedure note For atrial fibrillation, persistent.  Procedure Details:  Consent: Risks of procedure as well as the alternatives and risks of each were explained to the (patient/caregiver). Consent for procedure obtained.  Time Out: Verified patient identification, verified procedure, site/side was marked, verified correct patient position, special equipment/implants available, medications/allergies/relevent history reviewed, required imaging and test results available. Performed  Patient placed on cardiac monitor, pulse oximetry, supplemental oxygen as necessary.  Sedation given: propofol IV, Dr.  Donzetta Starch pads placed anterior and posterior chest.   Cardioverted 3 time(s).  Cardioverted at 150 J,  200J with compression,  200J with compression   Synchronized biphasic Converted to NSR   Evaluation: Findings: Post procedure EKG shows: NSR Complications: None Patient did tolerate procedure well.  Time Spent Directly with the Patient:  45 minutes   Dossie Arbour, M.D., Ph.D.

## 2019-01-13 NOTE — Transfer of Care (Signed)
Immediate Anesthesia Transfer of Care Note  Patient: Erin [name]  Procedure(s) Performed: Procedure(s): CARDIOVERSION (N/A)  Patient Location: PACU and Short Stay  Anesthesia Type:General  Level of Consciousness: awake, alert  and oriented  Airway & Oxygen Therapy: Patient Spontanous Breathing and Patient connected to nasal cannula oxygen  Post-op Assessment: Report given to RN and Post -op Vital signs reviewed and stable  Post vital signs: Reviewed and stable  Last Vitals:  Vitals:   01/13/19 0752 01/13/19 0753  BP:  (!) 93/58  Pulse: 76 81  Resp: (!) 28 (!) 22  Temp:    SpO2: 99% 98%    Complications: No apparent anesthesia complications

## 2019-01-19 ENCOUNTER — Other Ambulatory Visit: Payer: Self-pay | Admitting: Cardiovascular Disease

## 2019-01-20 ENCOUNTER — Other Ambulatory Visit: Payer: Self-pay

## 2019-01-20 MED ORDER — CLOPIDOGREL BISULFATE 75 MG PO TABS: ORAL_TABLET | ORAL | 0 refills | Status: DC

## 2019-01-23 ENCOUNTER — Other Ambulatory Visit: Payer: Self-pay

## 2019-01-24 ENCOUNTER — Encounter: Payer: Self-pay | Admitting: Internal Medicine

## 2019-01-24 ENCOUNTER — Ambulatory Visit (INDEPENDENT_AMBULATORY_CARE_PROVIDER_SITE_OTHER): Payer: Medicare Other | Admitting: Internal Medicine

## 2019-01-24 VITALS — BP 128/70 | HR 87 | Ht 60.0 in | Wt 214.8 lb

## 2019-01-24 DIAGNOSIS — I472 Ventricular tachycardia, unspecified: Secondary | ICD-10-CM

## 2019-01-24 DIAGNOSIS — Z95810 Presence of automatic (implantable) cardiac defibrillator: Secondary | ICD-10-CM

## 2019-01-24 DIAGNOSIS — E0590 Thyrotoxicosis, unspecified without thyrotoxic crisis or storm: Secondary | ICD-10-CM

## 2019-01-24 DIAGNOSIS — I4819 Other persistent atrial fibrillation: Secondary | ICD-10-CM

## 2019-01-24 DIAGNOSIS — I428 Other cardiomyopathies: Secondary | ICD-10-CM

## 2019-01-24 DIAGNOSIS — Z79899 Other long term (current) drug therapy: Secondary | ICD-10-CM

## 2019-01-24 NOTE — Patient Instructions (Signed)
Medication Instructions:  - Your physician recommends that you continue on your current medications as directed. Please refer to the Current Medication list given to you today.  *If you need a refill on your cardiac medications before your next appointment, please call your pharmacy*  Lab Work: - Your physician recommends that you have lab work today: TSH/ Free T3/ Free T4/ Liver  If you have labs (blood work) drawn today and your tests are completely normal, you will receive your results only by: Marland Kitchen MyChart Message (if you have MyChart) OR . A paper copy in the mail If you have any lab test that is abnormal or we need to change your treatment, we will call you to review the results.  Testing/Procedures: - none ordered  Follow-Up: At [facility], you and your health needs are our priority.  As part of our continuing mission to provide you with exceptional heart care, we have created designated Provider Care Teams.  These Care Teams include your primary Cardiologist (physician) and Advanced Practice Providers (APPs -  Physician Assistants and Nurse Practitioners) who all work together to provide you with the care you need, when you need it.  Your next appointment:   2 month(s)  The format for your next appointment:   In Person  Provider:   Sherryl Manges, MD  Other Instructions n/a

## 2019-01-24 NOTE — Progress Notes (Signed)
Patient Care Team: Patient, No Pcp Per as PCP - General (General Practice) Delma Freeze, FNP as Nurse Practitioner (Cardiology) Duke Salvia, MD as Consulting Physician (Cardiology) Antonieta Iba, MD as Consulting Physician (Cardiology)   HPI  Erin [name] is a 74 y.o. female Seen in followup for ICD implanted in  the context of a nonischemic and ischemic cardiomyopathy.echocardiogram-- 5/14 EF 25%  She has chronic systolic heart failure related to  atrial fibrillation-persistent. She took amiodarone and apixoban.  Amio was stopped 2/2 amio assoc hyperthyroidism--Rx with methimazole.. Became hypothyroid and was treated with levothyroxine Followed by Dr Everardo All.  She was on Synthroid at the time of resumption of amiodarone undertaken with some trepidation to support cardioversion in the hopes that we could address her heart failure  She is in sinus rhythm, now 10 days following cardioversion (1/21) she feels no better. It is also noteworthy that her Synthroid has been stopped and she has been put on Tapazole          Date Cr Hgb TSH ALT  8/18   4.9 16  11/18    2.13   12/19  11.3 26>>6.04 (2/20)   5/20 1.35    56  7/20 1.49  0.68 52  12/20 1.09 10.5 0.006 11          DATE TEST EF   5/14 Echo  20-25%   8/16 Echo  25 % (44/2.1/35)m LAE  8/18 Echo  20-25%   8/18 Cath  T Cx///90% LAD>>DES  11/18 Echo   25-30%         Amiodarone stopped 6/20 because of elevated transaminases; these normalized with its discontinuation.  At home she is ambulatory although just between rooms.  Denies orthostatic lightheadedness.  Shortness of breath.  Fatigue.  But no chest pain.  No edema.    DAPT + apixoban; Now on Clopidigrel and apixoban   No bleeding     Thromboembolic risk factors ( age -65, HTN-1, Vasc disease -1, CHF-1, Gender-1) for a CHADSVASc Score of >=5   Past Medical History:  Diagnosis Date  . Anginal pain (HCC)   . Automatic implantable  cardioverter-defibrillator in situ    a. s/p MDT ICD 07/2012; b. SN # [id number]   . CAD (coronary artery disease)    a. cath 2003: LM nl, mid LAD 50%, LCx nl, RCA nl b. L&RHC 08/17/2014 100% occluded mid LCx, 70% mid LAD with FFR 0.82. Medical therapy. CI 2.1. CO 4.01c. LHC 8/18: CTO LCx w/ R-L colatts, LAD s/p PCI/DES x 2  . Chronic systolic CHF (congestive heart failure) (HCC)    a. echo 2014: EF 25%, diffuse HK, LA moderately dilated, RA mildly dilated, PASP 38 mm Hg; b. echo 08/2014: EF 25-30%, diffuse HK, RWMA cannot be excluded, mild MR, mild biatrial enlargement, RV mildly dilated, wall thickness nl, pacer wire/catheter noted, mild to mod TR, PASP high nl, c. TTE 8/18: EF 25-30%, diffuse HK, GR1DD, mod MR, mod dilated LA, nl RV sys fxn, PASP 56  . Encounter for long-term (current) use of anticoagulants   . Exertional shortness of breath   . H/O medication noncompliance   . High cholesterol   . Hypertension   . NICM (nonischemic cardiomyopathy) (HCC)    a. s/p AICD 07/22/2012  . Obesity   . OSA on CPAP    "suppose to; not often" (07/22/2012)  . Other "heavy-for-dates" infants   . PAF (paroxysmal atrial fibrillation) (HCC)  a. on warfarin--> Eliquis 08/2014    Past Surgical History:  Procedure Laterality Date  . ABDOMINAL HYSTERECTOMY  ~ 1998   "laparoscopic" (07/22/2012)  . CARDIAC CATHETERIZATION  ? 1990's  . CARDIAC CATHETERIZATION N/A 08/17/2014   Procedure: Right/Left Heart Cath and Coronary Angiography;  Surgeon: Burnell Blanks, MD;  Location: [place] [facility];  Service: Cardiovascular;  Laterality: N/A;  . CARDIAC DEFIBRILLATOR PLACEMENT  07/22/2012   dual-chamber ICD.  Marland Kitchen CARDIOVERSION N/A 01/13/2019   Procedure: CARDIOVERSION;  Surgeon: Minna Merritts, MD;  Location: [facility] ORS;  Service: Cardiovascular;  Laterality: N/A;  . CORONARY STENT INTERVENTION N/A 08/24/2016   Procedure: CORONARY STENT INTERVENTION;  Surgeon: Wellington Hampshire, MD;  Location: [place] [facility]  LAB;  Service: Cardiovascular;  Laterality: N/A;  . IMPLANTABLE CARDIOVERTER DEFIBRILLATOR IMPLANT N/A 07/22/2012   Procedure: IMPLANTABLE CARDIOVERTER DEFIBRILLATOR IMPLANT;  Surgeon: Evans Lance, MD;  Location: [facility] CATH LAB;  Service: Cardiovascular;  Laterality: N/A;  . LEFT HEART CATH AND CORONARY ANGIOGRAPHY N/A 08/24/2016   Procedure: LEFT HEART CATH AND CORONARY ANGIOGRAPHY;  Surgeon: Wellington Hampshire, MD;  Location: [place] [facility] LAB;  Service: Cardiovascular;  Laterality: N/A;  . TEE WITHOUT CARDIOVERSION N/A 08/21/2014   Procedure: TRANSESOPHAGEAL ECHOCARDIOGRAM (TEE);  Surgeon: Skeet Latch, MD;  Location: [facility] ENDOSCOPY;  Service: Cardiovascular;  Laterality: N/A;  . TUBAL LIGATION  ~ 1978    Current Outpatient Medications  Medication Sig Dispense Refill  . atorvastatin (LIPITOR) 40 MG tablet TAKE 1 TABLET BY MOUTH ONCE DAILY AT  6PM 90 tablet 0  . carvedilol (COREG) 12.5 MG tablet Take 1 tablet (12.5 mg total) by mouth 2 (two) times daily. 60 tablet 6  . clopidogrel (PLAVIX) 75 MG tablet Take 1 tablet by mouth once daily with breakfast 90 tablet 0  . ELIQUIS 5 MG TABS tablet Take 1 tablet by mouth twice daily (Patient taking differently: Take 5 mg by mouth 2 (two) times daily. ) 60 tablet 5  . losartan (COZAAR) 50 MG tablet Take 1 tablet (50 mg total) by mouth daily. 30 tablet 6  . methimazole (TAPAZOLE) 10 MG tablet Take 2 tablets (20 mg total) by mouth 2 (two) times daily. MEDICATIONS REVIEWED. OKAY TO REFILL 120 tablet 0  . spironolactone (ALDACTONE) 25 MG tablet Take 1/2 (one-half) tablet by mouth once daily (Patient taking differently: Take 12.5 mg by mouth daily. ) 45 tablet 0  . torsemide (DEMADEX) 20 MG tablet Take 2 tablets (40 mg total) by mouth daily.     No current facility-administered medications for this visit.    No Known Allergies  Review of Systems negative except from HPI and PMH  Physical Exam Ht 5' (1.524 m)   Wt 214 lb 12 oz (97.4 kg)   BMI 41.94  kg/m   BP 128/70 (BP Location: Left Arm, Patient Position: Sitting, Cuff Size: Large)   Pulse 87   Ht 5' (1.524 m)   Wt 214 lb 12 oz (97.4 kg)   SpO2 98%   BMI 41.94 kg/m  Well developed and Morbidly obese in no acute distress HENT normal Neck supple with JVP-unable to discern Clear Device pocket well healed; without hematoma or erythema.  There is no tethering  Regular rate and rhythm, no murmur Abd-soft with active BS No Clubbing cyanosis   edema Skin-warm and dry A & Oriented  Grossly normal sensory and motor function  ECG sinus rhythm at 87 Interval 20/07/38 PVC Low voltage  Assessment and  Plan Atrial  fibrillation-persistent  Nonischemic cardiomyopathy  Coronary artery disease-2 vessel with recent stenting  Ventricular tachycardia-treated via device  Implantable defibrillator-[employer]  Treated hyperthyroidism/hypothryoidism  Congestive heart failure-chronic sys  Hypertransaminasemia normalized off amiodarone  PVCs  Low voltage ECG   VT detection set @ 150    Reviewed Dr. George Hugh note.  She was on thyroid replacement as of 12/20, this not withstanding that the amiodarone had been discontinued 6/20.  I suspect that her current hyperthyroidism may well be iatrogenic from thyroid replacement and not so much from amiodarone.  We will recheck thyroid today since they are available for Dr. Everardo All who is scheduled to see her tomorrow.  Transaminases were normal prior to amiodarone reinitiation; we will check them today as well.  Intercurrent ventricular tachycardia.  Right below the detection rate.  Interrupted by occasional PVCs resulting in falling below detection, ventricular tachycardia detection requiring sequential counts without problems listed features.  Have reprogrammed the device to detect VT at 135 bpm  Her lassitude and fatigue could be related to her hyperthyroid status, her Tapazole, or simply that she is no better in sinus rhythm.  For now, we  will continue her on amiodarone with the plan to reassess in about 8 weeks.  Hopefully at that interval, her thyroid status will become normal, she will not have developed hyper transaminasemia and we can make a decision as to whether she is better off in sinus or not  Her low voltage is concerning for amyloid particularly given symptoms of anemia and some orthostatic lightheadedness.

## 2019-01-25 ENCOUNTER — Other Ambulatory Visit: Payer: Self-pay

## 2019-01-25 ENCOUNTER — Encounter: Payer: Self-pay | Admitting: Endocrinology

## 2019-01-25 ENCOUNTER — Ambulatory Visit (INDEPENDENT_AMBULATORY_CARE_PROVIDER_SITE_OTHER): Payer: Medicare Other | Admitting: Endocrinology

## 2019-01-25 VITALS — BP 132/70 | HR 103 | Ht 60.0 in | Wt 214.6 lb

## 2019-01-25 DIAGNOSIS — T462X1A Poisoning by other antidysrhythmic drugs, accidental (unintentional), initial encounter: Secondary | ICD-10-CM | POA: Diagnosis not present

## 2019-01-25 DIAGNOSIS — E032 Hypothyroidism due to medicaments and other exogenous substances: Secondary | ICD-10-CM | POA: Diagnosis not present

## 2019-01-25 DIAGNOSIS — E0590 Thyrotoxicosis, unspecified without thyrotoxic crisis or storm: Secondary | ICD-10-CM | POA: Diagnosis not present

## 2019-01-25 LAB — T4, FREE: Free T4: 1.6 ng/dL (ref 0.82–1.77)

## 2019-01-25 LAB — HEPATIC FUNCTION PANEL
ALT: 12 IU/L (ref 0–32)
AST: 14 IU/L (ref 0–40)
Albumin: 3.8 g/dL (ref 3.7–4.7)
Alkaline Phosphatase: 105 IU/L (ref 39–117)
Bilirubin Total: 0.9 mg/dL (ref 0.0–1.2)
Bilirubin, Direct: 0.28 mg/dL (ref 0.00–0.40)
Total Protein: 6.6 g/dL (ref 6.0–8.5)

## 2019-01-25 LAB — TSH: TSH: 0.008 u[IU]/mL — ABNORMAL LOW (ref 0.450–4.500)

## 2019-01-25 LAB — T3, FREE: T3, Free: 3.9 pg/mL (ref 2.0–4.4)

## 2019-01-25 MED ORDER — METHIMAZOLE 10 MG PO TABS: 40.0000 mg | ORAL_TABLET | Freq: Two times a day (BID) | ORAL | 5 refills | Status: DC

## 2019-01-25 NOTE — Patient Instructions (Addendum)
Please increase the methimazole to 4 pills, twice a day.   If ever you have fever while taking methimazole, stop it and call us, even if the reason is obvious, because of the risk of a rare side-effect.  It is best to never miss the medication.  However, if you do miss it, next best is to double up the next time.   Please come back for a follow-up appointment in 2-3 weeks.

## 2019-01-25 NOTE — Progress Notes (Signed)
Subjective:    Patient ID: Erin [name], female    DOB: 06/11/1945, 74 y.o.   MRN: [id number]  HPI Pt returns for f/u of abnormal thyroid function (was dx'ed whlie on amiodarone--she has taken since 2014; in 2015, she had slightly elevated TSH; several measurements more recently were low; she has never had thyroid imaging; tapazole was resumed in 2018, but then stopped in late 2019, due to hypothyroidism; synthroid was started in early 2020; synthroid was stopped 12/20 due to low TSH; tapazole was started in preparation for DC cardioversion).  pt reports fatigue and doe.  She had DC cardioversion on 1/21.  pt states she feels well in general.  Specifically, she denies palpitations and tremor.  Past Medical History:  Diagnosis Date  . Anginal pain ([place])   . Automatic implantable cardioverter-defibrillator in situ    a. s/p MDT ICD 07/2012; b. SN # [id number]   . CAD (coronary artery disease)    a. cath 2003: LM nl, mid LAD 50%, LCx nl, RCA nl b. L&RHC 08/17/2014 100% occluded mid LCx, 70% mid LAD with FFR 0.82. Medical therapy. CI 2.1. CO 4.01c. LHC 8/18: CTO LCx w/ R-L colatts, LAD s/p PCI/DES x 2  . Chronic systolic CHF (congestive heart failure) ([place])    a. echo 2014: EF 25%, diffuse HK, LA moderately dilated, RA mildly dilated, PASP 38 mm Hg; b. echo 08/2014: EF 25-30%, diffuse HK, RWMA cannot be excluded, mild MR, mild biatrial enlargement, RV mildly dilated, wall thickness nl, pacer wire/catheter noted, mild to mod TR, PASP high nl, c. TTE 8/18: EF 25-30%, diffuse HK, GR1DD, mod MR, mod dilated LA, nl RV sys fxn, PASP 56  . Encounter for long-term (current) use of anticoagulants   . Exertional shortness of breath   . H/O medication noncompliance   . High cholesterol   . Hypertension   . NICM (nonischemic cardiomyopathy) ([place])    a. s/p AICD 07/22/2012  . Obesity   . OSA on CPAP    "suppose to; not often" (07/22/2012)  . Other "heavy-for-dates" infants   . PAF (paroxysmal atrial  fibrillation) ([place])    a. on warfarin--> Eliquis 08/2014    Past Surgical History:  Procedure Laterality Date  . ABDOMINAL HYSTERECTOMY  ~ 1998   "laparoscopic" (07/22/2012)  . CARDIAC CATHETERIZATION  ? 1990's  . CARDIAC CATHETERIZATION N/A 08/17/2014   Procedure: Right/Left Heart Cath and Coronary Angiography;  Surgeon: Burnell Blanks, MD;  Location: [place] [facility] LAB;  Service: Cardiovascular;  Laterality: N/A;  . CARDIAC DEFIBRILLATOR PLACEMENT  07/22/2012   dual-chamber ICD.  Marland Kitchen CARDIOVERSION N/A 01/13/2019   Procedure: CARDIOVERSION;  Surgeon: Minna Merritts, MD;  Location: [facility] ORS;  Service: Cardiovascular;  Laterality: N/A;  . CORONARY STENT INTERVENTION N/A 08/24/2016   Procedure: CORONARY STENT INTERVENTION;  Surgeon: Wellington Hampshire, MD;  Location: [place] [facility] LAB;  Service: Cardiovascular;  Laterality: N/A;  . IMPLANTABLE CARDIOVERTER DEFIBRILLATOR IMPLANT N/A 07/22/2012   Procedure: IMPLANTABLE CARDIOVERTER DEFIBRILLATOR IMPLANT;  Surgeon: Evans Lance, MD;  Location: [facility] CATH LAB;  Service: Cardiovascular;  Laterality: N/A;  . LEFT HEART CATH AND CORONARY ANGIOGRAPHY N/A 08/24/2016   Procedure: LEFT HEART CATH AND CORONARY ANGIOGRAPHY;  Surgeon: Wellington Hampshire, MD;  Location: [place] [facility] LAB;  Service: Cardiovascular;  Laterality: N/A;  . TEE WITHOUT CARDIOVERSION N/A 08/21/2014   Procedure: TRANSESOPHAGEAL ECHOCARDIOGRAM (TEE);  Surgeon: Skeet Latch, MD;  Location: [place];  Service: Cardiovascular;  Laterality: N/A;  .  TUBAL LIGATION  ~ 1978    Social History   Socioeconomic History  . Marital status: Single    Spouse name: Not on file  . Number of children: 3  . Years of education: Not on file  . Highest education level: Not on file  Occupational History  . Occupation: Retired    Associate Professor: [employer]  Tobacco Use  . Smoking status: Never Smoker  . Smokeless tobacco: Never Used  Substance and Sexual Activity  . Alcohol use: No   . Drug use: No  . Sexual activity: Never  Other Topics Concern  . Not on file  Social History Narrative  . Not on file   Social Determinants of Health   Financial Resource Strain:   . Difficulty of Paying Living Expenses: Not on file  Food Insecurity:   . Worried About Programme researcher, broadcasting/film/video in the Last Year: Not on file  . Ran Out of Food in the Last Year: Not on file  Transportation Needs:   . Lack of Transportation (Medical): Not on file  . Lack of Transportation (Non-Medical): Not on file  Physical Activity:   . Days of Exercise per Week: Not on file  . Minutes of Exercise per Session: Not on file  Stress:   . Feeling of Stress : Not on file  Social Connections:   . Frequency of Communication with Friends and Family: Not on file  . Frequency of Social Gatherings with Friends and Family: Not on file  . Attends Religious Services: Not on file  . Active Member of Clubs or Organizations: Not on file  . Attends Banker Meetings: Not on file  . Marital Status: Not on file  Intimate Partner Violence:   . Fear of Current or Ex-Partner: Not on file  . Emotionally Abused: Not on file  . Physically Abused: Not on file  . Sexually Abused: Not on file    Current Outpatient Medications on File Prior to Visit  Medication Sig Dispense Refill  . atorvastatin (LIPITOR) 40 MG tablet TAKE 1 TABLET BY MOUTH ONCE DAILY AT  6PM 90 tablet 0  . carvedilol (COREG) 12.5 MG tablet Take 1 tablet (12.5 mg total) by mouth 2 (two) times daily. 60 tablet 6  . clopidogrel (PLAVIX) 75 MG tablet Take 1 tablet by mouth once daily with breakfast 90 tablet 0  . ELIQUIS 5 MG TABS tablet Take 1 tablet by mouth twice daily (Patient taking differently: Take 5 mg by mouth 2 (two) times daily. ) 60 tablet 5  . losartan (COZAAR) 50 MG tablet Take 1 tablet (50 mg total) by mouth daily. 30 tablet 6  . spironolactone (ALDACTONE) 25 MG tablet Take 1/2 (one-half) tablet by mouth once daily (Patient taking  differently: Take 12.5 mg by mouth daily. ) 45 tablet 0  . torsemide (DEMADEX) 20 MG tablet Take 2 tablets (40 mg total) by mouth daily.     No current facility-administered medications on file prior to visit.    No Known Allergies  Family History  Problem Relation Age of Onset  . Heart disease Mother   . Heart disease Father   . Lung disease Sister   . Diabetes Sister   . Heart failure Brother   . Thyroid disease Neg Hx     BP 132/70 (BP Location: Right Arm, Patient Position: Sitting, Cuff Size: Large)   Pulse (!) 103   Ht 5' (1.524 m)   Wt 214 lb 9.6 oz (97.3 kg)  SpO2 98%   BMI 41.91 kg/m    Review of Systems Denies fever    Objective:   Physical Exam VITAL SIGNS:  See vs page GENERAL: no distress NECK: There is no palpable thyroid enlargement.  No thyroid nodule is palpable.  No palpable lymphadenopathy at the anterior neck.   Lab Results  Component Value Date   TSH 0.008 (L) 01/24/2019       Assessment & Plan:  She has evolved endogenous hyperthyroidism (was not due to synthroid rx'ed in 2020).  Worse. Noncompliance with methimazole, is proved by the above.  This medication is 100% effective when taken as rx'ed.   AF: amiodarone precludes RAI rx, and surgery is also not favored, due to anticoagulation

## 2019-02-10 ENCOUNTER — Other Ambulatory Visit: Payer: Self-pay

## 2019-02-14 ENCOUNTER — Other Ambulatory Visit: Payer: Self-pay

## 2019-02-14 ENCOUNTER — Ambulatory Visit (INDEPENDENT_AMBULATORY_CARE_PROVIDER_SITE_OTHER): Payer: Medicare Other | Admitting: Endocrinology

## 2019-02-14 ENCOUNTER — Encounter: Payer: Self-pay | Admitting: Endocrinology

## 2019-02-14 VITALS — BP 110/60 | HR 88 | Ht 60.0 in | Wt 206.0 lb

## 2019-02-14 DIAGNOSIS — E032 Hypothyroidism due to medicaments and other exogenous substances: Secondary | ICD-10-CM

## 2019-02-14 DIAGNOSIS — T462X1A Poisoning by other antidysrhythmic drugs, accidental (unintentional), initial encounter: Secondary | ICD-10-CM | POA: Diagnosis not present

## 2019-02-14 DIAGNOSIS — E0590 Thyrotoxicosis, unspecified without thyrotoxic crisis or storm: Secondary | ICD-10-CM | POA: Diagnosis not present

## 2019-02-14 LAB — TSH: TSH: <0.01 u[IU]/mL — ABNORMAL LOW (ref 0.35–4.50)

## 2019-02-14 LAB — T4, FREE: Free T4: 1.21 ng/dL (ref 0.60–1.60)

## 2019-02-14 MED ORDER — METHIMAZOLE 10 MG PO TABS: 40.0000 mg | ORAL_TABLET | Freq: Two times a day (BID) | ORAL | 5 refills | Status: DC

## 2019-02-14 NOTE — Patient Instructions (Signed)
Blood tests are requested for you today.  We'll let you know about the results.  If ever you have fever while taking methimazole, stop it and call us, even if the reason is obvious, because of the risk of a rare side-effect. It is best to never miss the medication.  However, if you do miss it, next best is to double up the next time.   Please come back for a follow-up appointment in 1 month.   

## 2019-02-14 NOTE — Progress Notes (Signed)
Subjective:    Patient ID: Erin [name], female    DOB: 11/22/1945, 74 y.o.   MRN: [id number]  HPI Pt returns for f/u of abnormal thyroid function (was dx'ed whlie on amiodarone--she has taken since 2014; in 2015, she had slightly elevated TSH; several measurements more recently were low; she has never had thyroid imaging; tapazole was resumed in 2018, but then stopped in late 2019, due to hypothyroidism; synthroid was started in early 2020; synthroid was stopped 12/20 due to low TSH; tapazole was started in preparation for DC cardioversion, which she had on 1/21.  pt states she feels well in general.  Specifically, she denies palpitations, doe and tremor.  She says she never misses the tapazole Past Medical History:  Diagnosis Date  . Anginal pain (HCC)   . Automatic implantable cardioverter-defibrillator in situ    a. s/p MDT ICD 07/2012; b. SN # [id number]   . CAD (coronary artery disease)    a. cath 2003: LM nl, mid LAD 50%, LCx nl, RCA nl b. L&RHC 08/17/2014 100% occluded mid LCx, 70% mid LAD with FFR 0.82. Medical therapy. CI 2.1. CO 4.01c. LHC 8/18: CTO LCx w/ R-L colatts, LAD s/p PCI/DES x 2  . Chronic systolic CHF (congestive heart failure) (HCC)    a. echo 2014: EF 25%, diffuse HK, LA moderately dilated, RA mildly dilated, PASP 38 mm Hg; b. echo 08/2014: EF 25-30%, diffuse HK, RWMA cannot be excluded, mild MR, mild biatrial enlargement, RV mildly dilated, wall thickness nl, pacer wire/catheter noted, mild to mod TR, PASP high nl, c. TTE 8/18: EF 25-30%, diffuse HK, GR1DD, mod MR, mod dilated LA, nl RV sys fxn, PASP 56  . Encounter for long-term (current) use of anticoagulants   . Exertional shortness of breath   . H/O medication noncompliance   . High cholesterol   . Hypertension   . NICM (nonischemic cardiomyopathy) (HCC)    a. s/p AICD 07/22/2012  . Obesity   . OSA on CPAP    "suppose to; not often" (07/22/2012)  . Other "heavy-for-dates" infants   . PAF (paroxysmal atrial  fibrillation) (HCC)    a. on warfarin--> Eliquis 08/2014    Past Surgical History:  Procedure Laterality Date  . ABDOMINAL HYSTERECTOMY  ~ 1998   "laparoscopic" (07/22/2012)  . CARDIAC CATHETERIZATION  ? 1990's  . CARDIAC CATHETERIZATION N/A 08/17/2014   Procedure: Right/Left Heart Cath and Coronary Angiography;  Surgeon: Kathleene Hazel, MD;  Location: [facility] INVASIVE CV LAB;  Service: Cardiovascular;  Laterality: N/A;  . CARDIAC DEFIBRILLATOR PLACEMENT  07/22/2012   dual-chamber ICD.  Marland Kitchen CARDIOVERSION N/A 01/13/2019   Procedure: CARDIOVERSION;  Surgeon: Antonieta Iba, MD;  Location: [facility] ORS;  Service: Cardiovascular;  Laterality: N/A;  . CORONARY STENT INTERVENTION N/A 08/24/2016   Procedure: CORONARY STENT INTERVENTION;  Surgeon: Iran Ouch, MD;  Location: [facility] INVASIVE CV LAB;  Service: Cardiovascular;  Laterality: N/A;  . IMPLANTABLE CARDIOVERTER DEFIBRILLATOR IMPLANT N/A 07/22/2012   Procedure: IMPLANTABLE CARDIOVERTER DEFIBRILLATOR IMPLANT;  Surgeon: Marinus Maw, MD;  Location: [facility] CATH LAB;  Service: Cardiovascular;  Laterality: N/A;  . LEFT HEART CATH AND CORONARY ANGIOGRAPHY N/A 08/24/2016   Procedure: LEFT HEART CATH AND CORONARY ANGIOGRAPHY;  Surgeon: Iran Ouch, MD;  Location: [facility] INVASIVE CV LAB;  Service: Cardiovascular;  Laterality: N/A;  . TEE WITHOUT CARDIOVERSION N/A 08/21/2014   Procedure: TRANSESOPHAGEAL ECHOCARDIOGRAM (TEE);  Surgeon: Chilton Si, MD;  Location: [facility] ENDOSCOPY;  Service: Cardiovascular;  Laterality: N/A;  .  TUBAL LIGATION  ~ 1978    Social History   Socioeconomic History  . Marital status: Single    Spouse name: Not on file  . Number of children: 3  . Years of education: Not on file  . Highest education level: Not on file  Occupational History  . Occupation: Retired    Associate Professor: [employer]  Tobacco Use  . Smoking status: Never Smoker  . Smokeless tobacco: Never Used  Substance and Sexual Activity  . Alcohol use: No    . Drug use: No  . Sexual activity: Never  Other Topics Concern  . Not on file  Social History Narrative  . Not on file   Social Determinants of Health   Financial Resource Strain:   . Difficulty of Paying Living Expenses: Not on file  Food Insecurity:   . Worried About Programme researcher, broadcasting/film/video in the Last Year: Not on file  . Ran Out of Food in the Last Year: Not on file  Transportation Needs:   . Lack of Transportation (Medical): Not on file  . Lack of Transportation (Non-Medical): Not on file  Physical Activity:   . Days of Exercise per Week: Not on file  . Minutes of Exercise per Session: Not on file  Stress:   . Feeling of Stress : Not on file  Social Connections:   . Frequency of Communication with Friends and Family: Not on file  . Frequency of Social Gatherings with Friends and Family: Not on file  . Attends Religious Services: Not on file  . Active Member of Clubs or Organizations: Not on file  . Attends Banker Meetings: Not on file  . Marital Status: Not on file  Intimate Partner Violence:   . Fear of Current or Ex-Partner: Not on file  . Emotionally Abused: Not on file  . Physically Abused: Not on file  . Sexually Abused: Not on file    Current Outpatient Medications on File Prior to Visit  Medication Sig Dispense Refill  . atorvastatin (LIPITOR) 40 MG tablet TAKE 1 TABLET BY MOUTH ONCE DAILY AT  6PM 90 tablet 0  . carvedilol (COREG) 12.5 MG tablet Take 1 tablet (12.5 mg total) by mouth 2 (two) times daily. 60 tablet 6  . clopidogrel (PLAVIX) 75 MG tablet Take 1 tablet by mouth once daily with breakfast 90 tablet 0  . ELIQUIS 5 MG TABS tablet Take 1 tablet by mouth twice daily (Patient taking differently: Take 5 mg by mouth 2 (two) times daily. ) 60 tablet 5  . losartan (COZAAR) 50 MG tablet Take 1 tablet (50 mg total) by mouth daily. 30 tablet 6  . spironolactone (ALDACTONE) 25 MG tablet Take 1/2 (one-half) tablet by mouth once daily (Patient taking  differently: Take 12.5 mg by mouth daily. ) 45 tablet 0  . torsemide (DEMADEX) 20 MG tablet Take 2 tablets (40 mg total) by mouth daily.     No current facility-administered medications on file prior to visit.    No Known Allergies  Family History  Problem Relation Age of Onset  . Heart disease Mother   . Heart disease Father   . Lung disease Sister   . Diabetes Sister   . Heart failure Brother   . Thyroid disease Neg Hx     BP 110/60 (BP Location: Right Arm, Patient Position: Sitting, Cuff Size: Large)   Pulse 88   Ht 5' (1.524 m)   Wt 206 lb (93.4 kg)   SpO2  94%   BMI 40.23 kg/m    Review of Systems She denies fever    Objective:   Physical Exam VITAL SIGNS:  See vs page GENERAL: no distress NECK: There is no palpable thyroid enlargement.  No thyroid nodule is palpable.  No palpable lymphadenopathy at the anterior neck.   Lab Results  Component Value Date   TSH <0.01 Repeated and verified X2. (L) 02/14/2019      Assessment & Plan:  Hyperthyroidism: persistent. Please continue the same medication Please come back for a follow-up appointment in 1 month.

## 2019-02-15 ENCOUNTER — Telehealth: Payer: Self-pay

## 2019-02-15 NOTE — Telephone Encounter (Signed)
Lab results reviewed by Dr. Ellison. Letter has been mailed. For future reference, letter can be found in Epic. 

## 2019-02-15 NOTE — Telephone Encounter (Signed)
-----   Message from Romero Belling, MD sent at 02/14/2019  6:02 PM EST ----- please contact patient: Slow improvement.  Please continue the same medication. I'll see you next time.

## 2019-03-02 ENCOUNTER — Other Ambulatory Visit: Payer: Self-pay | Admitting: Internal Medicine

## 2019-03-07 ENCOUNTER — Ambulatory Visit (INDEPENDENT_AMBULATORY_CARE_PROVIDER_SITE_OTHER): Payer: Medicare Other | Admitting: *Deleted

## 2019-03-07 DIAGNOSIS — Z95810 Presence of automatic (implantable) cardiac defibrillator: Secondary | ICD-10-CM | POA: Diagnosis not present

## 2019-03-09 ENCOUNTER — Other Ambulatory Visit: Payer: Self-pay

## 2019-03-09 ENCOUNTER — Telehealth: Payer: Self-pay | Admitting: Emergency Medicine

## 2019-03-09 LAB — CUP PACEART REMOTE DEVICE CHECK
Battery Remaining Longevity: 25 mo
Battery Voltage: 2.95 V
Brady Statistic AP VP Percent: 0 %
Brady Statistic AP VS Percent: 0 %
Brady Statistic AS VP Percent: 0.06 %
Brady Statistic AS VS Percent: 99.93 %
Brady Statistic RA Percent Paced: 0.01 %
Brady Statistic RV Percent Paced: 0.06 %
Date Time Interrogation Session: 20210304103259
HighPow Impedance: 70 Ohm
Implantable Lead Implant Date: 20140718
Implantable Lead Implant Date: 20140718
Implantable Lead Location: 753859
Implantable Lead Location: 753860
Implantable Lead Model: 5076
Implantable Lead Model: 6935
Implantable Pulse Generator Implant Date: 20140718
Lead Channel Impedance Value: 304 Ohm
Lead Channel Impedance Value: 380 Ohm
Lead Channel Impedance Value: 437 Ohm
Lead Channel Pacing Threshold Amplitude: 0.5 V
Lead Channel Pacing Threshold Amplitude: 0.625 V
Lead Channel Pacing Threshold Pulse Width: 0.4 ms
Lead Channel Pacing Threshold Pulse Width: 0.4 ms
Lead Channel Sensing Intrinsic Amplitude: 13.5 mV
Lead Channel Sensing Intrinsic Amplitude: 13.5 mV
Lead Channel Sensing Intrinsic Amplitude: 4.125 mV
Lead Channel Sensing Intrinsic Amplitude: 4.125 mV
Lead Channel Setting Pacing Amplitude: 2 V
Lead Channel Setting Pacing Amplitude: 2.5 V
Lead Channel Setting Pacing Pulse Width: 0.4 ms
Lead Channel Setting Sensing Sensitivity: 0.3 mV

## 2019-03-09 NOTE — Progress Notes (Signed)
ICD Remote  

## 2019-03-09 NOTE — Telephone Encounter (Signed)
No treated VT since the end of jan   Will see as scheduled

## 2019-03-09 NOTE — Telephone Encounter (Signed)
Received alert for 91 day transmission that showed 2 episodes of VT treated successfully by ATP x 1 on 1/20 and 02/04/19 and over 400 episodes of NSVT  Since patient was seen by Dr Graciela Husbands in office 01/24/19. Patient was unaware of episodes and has been asymptomatic since visit in January. Patient has follow up with Dr Graciela Husbands on 03/28/19  [facility]. Shock plan reviewed, ED precautions given and [place] DMV driving restrictions reviewed with patient.

## 2019-03-10 NOTE — Telephone Encounter (Signed)
Patient informed that there are no changes to her treatment plan at this time and to follow up as scheduled on 03/28/19 at 0920 am in [place] with Dr Graciela Husbands.

## 2019-03-13 ENCOUNTER — Ambulatory Visit (INDEPENDENT_AMBULATORY_CARE_PROVIDER_SITE_OTHER): Payer: Medicare Other | Admitting: Endocrinology

## 2019-03-13 ENCOUNTER — Telehealth: Payer: Self-pay

## 2019-03-13 ENCOUNTER — Other Ambulatory Visit: Payer: Self-pay

## 2019-03-13 ENCOUNTER — Encounter: Payer: Self-pay | Admitting: Endocrinology

## 2019-03-13 VITALS — BP 104/62 | HR 89 | Ht 60.0 in | Wt 207.0 lb

## 2019-03-13 DIAGNOSIS — E0590 Thyrotoxicosis, unspecified without thyrotoxic crisis or storm: Secondary | ICD-10-CM | POA: Diagnosis not present

## 2019-03-13 LAB — T4, FREE: Free T4: 1.05 ng/dL (ref 0.60–1.60)

## 2019-03-13 LAB — TSH: TSH: 0.02 u[IU]/mL — ABNORMAL LOW (ref 0.35–4.50)

## 2019-03-13 NOTE — Telephone Encounter (Signed)
LAB RESULTS  Lab results were reviewed by Dr. Ellison. A letter has been mailed to pt home address. For future reference, letter can be found in Epic. 

## 2019-03-13 NOTE — Patient Instructions (Signed)
Blood tests are requested for you today.  We'll let you know about the results.  If ever you have fever while taking methimazole, stop it and call us, even if the reason is obvious, because of the risk of a rare side-effect. It is best to never miss the medication.  However, if you do miss it, next best is to double up the next time.   Please come back for a follow-up appointment in 1 month.   

## 2019-03-13 NOTE — Telephone Encounter (Signed)
-----   Message from Romero Belling, MD sent at 03/13/2019  3:56 PM EST ----- please contact patient: Improved, but no there yet.  Please continue the same medication.  I'll see you next time.

## 2019-03-13 NOTE — Progress Notes (Signed)
Subjective:    Patient ID: Erin [name], female    DOB: 03/07/1945, 74 y.o.   MRN: [id number]  HPI Pt returns for f/u of abnormal thyroid function (dx'ed whlie on amiodarone, which she took 2014-2020; in 2015, she had slightly elevated TSH; several measurements more recently were low; she has never had thyroid imaging; tapazole was resumed in 2018, but then stopped in late 2019, due to hypothyroidism; synthroid was started in early 2020; synthroid was stopped 12/20 due to low TSH; tapazole was started in preparation for DC cardioversion, which she had on 1/21.  pt states she feels well in general.  Specifically, she denies palpitations, doe and tremor.  She says she never misses the tapazole.   Past Medical History:  Diagnosis Date  . Anginal pain (HCC)   . Automatic implantable cardioverter-defibrillator in situ    a. s/p MDT ICD 07/2012; b. SN # [id number]   . CAD (coronary artery disease)    a. cath 2003: LM nl, mid LAD 50%, LCx nl, RCA nl b. L&RHC 08/17/2014 100% occluded mid LCx, 70% mid LAD with FFR 0.82. Medical therapy. CI 2.1. CO 4.01c. LHC 8/18: CTO LCx w/ R-L colatts, LAD s/p PCI/DES x 2  . Chronic systolic CHF (congestive heart failure) (HCC)    a. echo 2014: EF 25%, diffuse HK, LA moderately dilated, RA mildly dilated, PASP 38 mm Hg; b. echo 08/2014: EF 25-30%, diffuse HK, RWMA cannot be excluded, mild MR, mild biatrial enlargement, RV mildly dilated, wall thickness nl, pacer wire/catheter noted, mild to mod TR, PASP high nl, c. TTE 8/18: EF 25-30%, diffuse HK, GR1DD, mod MR, mod dilated LA, nl RV sys fxn, PASP 56  . Encounter for long-term (current) use of anticoagulants   . Exertional shortness of breath   . H/O medication noncompliance   . High cholesterol   . Hypertension   . NICM (nonischemic cardiomyopathy) (HCC)    a. s/p AICD 07/22/2012  . Obesity   . OSA on CPAP    "suppose to; not often" (07/22/2012)  . Other "heavy-for-dates" infants   . PAF (paroxysmal atrial  fibrillation) (HCC)    a. on warfarin--> Eliquis 08/2014    Past Surgical History:  Procedure Laterality Date  . ABDOMINAL HYSTERECTOMY  ~ 1998   "laparoscopic" (07/22/2012)  . CARDIAC CATHETERIZATION  ? 1990's  . CARDIAC CATHETERIZATION N/A 08/17/2014   Procedure: Right/Left Heart Cath and Coronary Angiography;  Surgeon: Kathleene Hazel, MD;  Location[facility] INVASIVE CV LAB;  Service: Cardiovascular;  Laterality: N/A;  . CARDIAC DEFIBRILLATOR PLACEMENT  07/22/2012   dual-chamber ICD.  Marland Kitchen CARDIOVERSION N/A 01/13/2019   Procedure: CARDIOVERSION;  Surgeon: Antonieta Iba, MD;  Location: [facility] ORS;  Service: Cardiovascular;  Laterality: N/A;  . CORONARY STENT INTERVENTION N/A 08/24/2016   Procedure: CORONARY STENT INTERVENTION;  Surgeon: Iran Ouch, MD;  Location: [facility] INVASIVE CV LAB;  Service: Cardiovascular;  Laterality: N/A;  . IMPLANTABLE CARDIOVERTER DEFIBRILLATOR IMPLANT N/A 07/22/2012   Procedure: IMPLANTABLE CARDIOVERTER DEFIBRILLATOR IMPLANT;  Surgeon: Marinus Maw, MD;  Location: [facility] CATH LAB;  Service: Cardiovascular;  Laterality: N/A;  . LEFT HEART CATH AND CORONARY ANGIOGRAPHY N/A 08/24/2016   Procedure: LEFT HEART CATH AND CORONARY ANGIOGRAPHY;  Surgeon: Iran Ouch, MD;  Location: [facility] INVASIVE CV LAB;  Service: Cardiovascular;  Laterality: N/A;  . TEE WITHOUT CARDIOVERSION N/A 08/21/2014   Procedure: TRANSESOPHAGEAL ECHOCARDIOGRAM (TEE);  Surgeon: Chilton Si, MD;  Location: [facility] ENDOSCOPY;  Service: Cardiovascular;  Laterality: N/A;  .  TUBAL LIGATION  ~ 1978    Social History   Socioeconomic History  . Marital status: Single    Spouse name: Not on file  . Number of children: 3  . Years of education: Not on file  . Highest education level: Not on file  Occupational History  . Occupation: Retired    Associate Professor: [employer]  Tobacco Use  . Smoking status: Never Smoker  . Smokeless tobacco: Never Used  Substance and Sexual Activity  . Alcohol use: No   . Drug use: No  . Sexual activity: Never  Other Topics Concern  . Not on file  Social History Narrative  . Not on file   Social Determinants of Health   Financial Resource Strain:   . Difficulty of Paying Living Expenses: Not on file  Food Insecurity:   . Worried About Programme researcher, broadcasting/film/video in the Last Year: Not on file  . Ran Out of Food in the Last Year: Not on file  Transportation Needs:   . Lack of Transportation (Medical): Not on file  . Lack of Transportation (Non-Medical): Not on file  Physical Activity:   . Days of Exercise per Week: Not on file  . Minutes of Exercise per Session: Not on file  Stress:   . Feeling of Stress : Not on file  Social Connections:   . Frequency of Communication with Friends and Family: Not on file  . Frequency of Social Gatherings with Friends and Family: Not on file  . Attends Religious Services: Not on file  . Active Member of Clubs or Organizations: Not on file  . Attends Banker Meetings: Not on file  . Marital Status: Not on file  Intimate Partner Violence:   . Fear of Current or Ex-Partner: Not on file  . Emotionally Abused: Not on file  . Physically Abused: Not on file  . Sexually Abused: Not on file    Current Outpatient Medications on File Prior to Visit  Medication Sig Dispense Refill  . atorvastatin (LIPITOR) 40 MG tablet TAKE 1 TABLET BY MOUTH ONCE DAILY AT  6PM 90 tablet 0  . carvedilol (COREG) 12.5 MG tablet Take 1 tablet (12.5 mg total) by mouth 2 (two) times daily. 60 tablet 6  . clopidogrel (PLAVIX) 75 MG tablet Take 1 tablet by mouth once daily with breakfast 90 tablet 0  . ELIQUIS 5 MG TABS tablet Take 1 tablet by mouth twice daily (Patient taking differently: Take 5 mg by mouth 2 (two) times daily. ) 60 tablet 5  . losartan (COZAAR) 50 MG tablet Take 1 tablet (50 mg total) by mouth daily. 30 tablet 6  . methimazole (TAPAZOLE) 10 MG tablet Take 4 tablets (40 mg total) by mouth 2 (two) times daily. 240 tablet  5  . spironolactone (ALDACTONE) 25 MG tablet Take 1/2 (one-half) tablet by mouth once daily 45 tablet 3  . torsemide (DEMADEX) 20 MG tablet Take 2 tablets (40 mg total) by mouth daily.     No current facility-administered medications on file prior to visit.    No Known Allergies  Family History  Problem Relation Age of Onset  . Heart disease Mother   . Heart disease Father   . Lung disease Sister   . Diabetes Sister   . Heart failure Brother   . Thyroid disease Neg Hx     BP 104/62 (BP Location: Right Arm, Patient Position: Sitting, Cuff Size: Large)   Pulse 89   Ht 5' (1.524  m)   Wt 207 lb (93.9 kg)   SpO2 90%   BMI 40.43 kg/m    Review of Systems Denies fever.      Objective:   Physical Exam VITAL SIGNS:  See vs page GENERAL: no distress NECK: thyroid is slightly and diffusely enlarged.  No thyroid nodule is palpable.  No palpable lymphadenopathy at the anterior neck.       Assessment & Plan:  Hyperthyroidism: due for recheck AF: she can resume amiodarone if necessary.   Patient Instructions  Blood tests are requested for you today.  We'll let you know about the results.  If ever you have fever while taking methimazole, stop it and call us, even if the reason is obvious, because of the risk of a rare side-effect.  It is best to never miss the medication.  However, if you do miss it, next best is to double up the next time.   Please come back for a follow-up appointment in 1 month.

## 2019-03-28 ENCOUNTER — Encounter: Payer: Self-pay | Admitting: Internal Medicine

## 2019-03-28 ENCOUNTER — Other Ambulatory Visit: Payer: Self-pay

## 2019-03-28 ENCOUNTER — Ambulatory Visit (INDEPENDENT_AMBULATORY_CARE_PROVIDER_SITE_OTHER): Payer: Medicare Other | Admitting: Internal Medicine

## 2019-03-28 ENCOUNTER — Telehealth: Payer: Self-pay | Admitting: Internal Medicine

## 2019-03-28 VITALS — BP 112/62 | HR 86 | Ht 60.0 in | Wt 210.0 lb

## 2019-03-28 DIAGNOSIS — Z95810 Presence of automatic (implantable) cardiac defibrillator: Secondary | ICD-10-CM | POA: Diagnosis not present

## 2019-03-28 DIAGNOSIS — I472 Ventricular tachycardia, unspecified: Secondary | ICD-10-CM

## 2019-03-28 DIAGNOSIS — I428 Other cardiomyopathies: Secondary | ICD-10-CM | POA: Diagnosis not present

## 2019-03-28 DIAGNOSIS — I4819 Other persistent atrial fibrillation: Secondary | ICD-10-CM

## 2019-03-28 MED ORDER — PROPRANOLOL HCL ER 120 MG PO CP24: 120.0000 mg | ORAL_CAPSULE | Freq: Every day | ORAL | 6 refills | Status: DC

## 2019-03-28 NOTE — Telephone Encounter (Signed)
Patient calling with medication prices Mexiletine 200 MG - $47 for 30 day supply Multaq 400 MG - $47 for 30 day supply  Patient has no preference, either one the provider feels is best Please call to discuss

## 2019-03-28 NOTE — Telephone Encounter (Signed)
To Dr. Klein to review. 

## 2019-03-28 NOTE — Progress Notes (Signed)
Patient Care Team: Patient, No Pcp Per as PCP - General (General Practice) Delma Freeze, FNP as Nurse Practitioner (Cardiology) Duke Salvia, MD as Consulting Physician (Cardiology) Antonieta Iba, MD as Consulting Physician (Cardiology)   HPI  Erin [name] is a 74 y.o. female Seen in followup for ICD implanted in  the context of a nonischemic and ischemic cardiomyopathy.echocardiogram-- 5/14 EF 25%  She has chronic systolic heart failure related to  atrial fibrillation-persistent. She took amiodarone and apixoban.  Amio was stopped 2/2 amio assoc hyperthyroidism--Rx with methimazole.. Became hypothyroid and was treated with levothyroxine Followed by Dr Everardo All.  S   She remains in sinus rhythm.  Limited exercise tolerance.  No chest pain.  No edema.         Date Cr Hgb TSH ALT  8/18   4.9 16  11/18    2.13   12/19  11.3 26>>6.04 (2/20)   5/20 1.35    56  7/20 1.49  0.68 52  12/20 1.09 10.5 0.006 11  3/21   0.02<<0.01     DATE TEST EF   5/14 Echo  20-25%   8/16 Echo  25 % (44/2.1/35)m LAE  8/18 Echo  20-25%   8/18 Cath  T Cx///90% LAD>>DES  11/18 Echo   25-30%           DAPT + apixoban; Now on Clopidigrel and apixoban   no bleeding    Thromboembolic risk factors ( age -47, HTN-1, Vasc disease -1, CHF-1, Gender-1) for a CHADSVASc Score of >=5   Past Medical History:  Diagnosis Date  . Anginal pain (HCC)   . Automatic implantable cardioverter-defibrillator in situ    a. s/p MDT ICD 07/2012; b. SN # [id number]   . CAD (coronary artery disease)    a. cath 2003: LM nl, mid LAD 50%, LCx nl, RCA nl b. L&RHC 08/17/2014 100% occluded mid LCx, 70% mid LAD with FFR 0.82. Medical therapy. CI 2.1. CO 4.01c. LHC 8/18: CTO LCx w/ R-L colatts, LAD s/p PCI/DES x 2  . Chronic systolic CHF (congestive heart failure) (HCC)    a. echo 2014: EF 25%, diffuse HK, LA moderately dilated, RA mildly dilated, PASP 38 mm Hg; b. echo 08/2014: EF 25-30%, diffuse HK, RWMA cannot be  excluded, mild MR, mild biatrial enlargement, RV mildly dilated, wall thickness nl, pacer wire/catheter noted, mild to mod TR, PASP high nl, c. TTE 8/18: EF 25-30%, diffuse HK, GR1DD, mod MR, mod dilated LA, nl RV sys fxn, PASP 56  . Encounter for long-term (current) use of anticoagulants   . Exertional shortness of breath   . H/O medication noncompliance   . High cholesterol   . Hypertension   . NICM (nonischemic cardiomyopathy) (HCC)    a. s/p AICD 07/22/2012  . Obesity   . OSA on CPAP    "suppose to; not often" (07/22/2012)  . Other "heavy-for-dates" infants   . PAF (paroxysmal atrial fibrillation) (HCC)    a. on warfarin--> Eliquis 08/2014    Past Surgical History:  Procedure Laterality Date  . ABDOMINAL HYSTERECTOMY  ~ 1998   "laparoscopic" (07/22/2012)  . CARDIAC CATHETERIZATION  ? 1990's  . CARDIAC CATHETERIZATION N/A 08/17/2014   Procedure: Right/Left Heart Cath and Coronary Angiography;  Surgeon: Kathleene Hazel, MD;  Location: [facility] INVASIVE CV LAB;  Service: Cardiovascular;  Laterality: N/A;  . CARDIAC DEFIBRILLATOR PLACEMENT  07/22/2012   dual-chamber ICD.  Marland Kitchen CARDIOVERSION N/A 01/13/2019  Procedure: CARDIOVERSION;  Surgeon: Antonieta Iba, MD;  Location: [facility] ORS;  Service: Cardiovascular;  Laterality: N/A;  . CORONARY STENT INTERVENTION N/A 08/24/2016   Procedure: CORONARY STENT INTERVENTION;  Surgeon: Iran Ouch, MD;  Location: [facility] INVASIVE CV LAB;  Service: Cardiovascular;  Laterality: N/A;  . IMPLANTABLE CARDIOVERTER DEFIBRILLATOR IMPLANT N/A 07/22/2012   Procedure: IMPLANTABLE CARDIOVERTER DEFIBRILLATOR IMPLANT;  Surgeon: Marinus Maw, MD;  Location: [facility] CATH LAB;  Service: Cardiovascular;  Laterality: N/A;  . LEFT HEART CATH AND CORONARY ANGIOGRAPHY N/A 08/24/2016   Procedure: LEFT HEART CATH AND CORONARY ANGIOGRAPHY;  Surgeon: Iran Ouch, MD;  Location: [facility] INVASIVE CV LAB;  Service: Cardiovascular;  Laterality: N/A;  . TEE WITHOUT CARDIOVERSION N/A  08/21/2014   Procedure: TRANSESOPHAGEAL ECHOCARDIOGRAM (TEE);  Surgeon: Chilton Si, MD;  Location: [facility] ENDOSCOPY;  Service: Cardiovascular;  Laterality: N/A;  . TUBAL LIGATION  ~ 1978    Current Outpatient Medications  Medication Sig Dispense Refill  . atorvastatin (LIPITOR) 40 MG tablet TAKE 1 TABLET BY MOUTH ONCE DAILY AT  6PM 90 tablet 0  . clopidogrel (PLAVIX) 75 MG tablet Take 1 tablet by mouth once daily with breakfast 90 tablet 0  . ELIQUIS 5 MG TABS tablet Take 1 tablet by mouth twice daily (Patient taking differently: Take 5 mg by mouth 2 (two) times daily. ) 60 tablet 5  . methimazole (TAPAZOLE) 10 MG tablet Take 4 tablets (40 mg total) by mouth 2 (two) times daily. 240 tablet 5  . spironolactone (ALDACTONE) 25 MG tablet Take 1/2 (one-half) tablet by mouth once daily 45 tablet 3  . torsemide (DEMADEX) 20 MG tablet Take 2 tablets (40 mg total) by mouth daily.    . carvedilol (COREG) 12.5 MG tablet Take 1 tablet (12.5 mg total) by mouth 2 (two) times daily. 60 tablet 6  . losartan (COZAAR) 50 MG tablet Take 1 tablet (50 mg total) by mouth daily. 30 tablet 6   No current facility-administered medications for this visit.    No Known Allergies  Review of Systems negative except from HPI and PMH  Physical Exam   BP 112/62 (BP Location: Left Arm, Patient Position: Sitting, Cuff Size: Normal)   Pulse 86   Ht 5' (1.524 m)   Wt 210 lb (95.3 kg)   SpO2 97%   BMI 41.01 kg/m  Well developed and well nourished in no acute distress HENT normal Neck supple with JVP-flat Clear Device pocket well healed; without hematoma or erythema.  There is no tethering  Regular rate and rhythm, no  gallop No murmur Abd-soft with active BS No Clubbing cyanosis  edema Skin-warm and dry A & Oriented  Grossly normal sensory and motor function  ECG sinus rhythm at 86 Intervals 18/86/41 (49) Low voltage   Assessment and  Plan Atrial fibrillation-persistent  Nonischemic  cardiomyopathy  Coronary artery disease-2 vessel with recent stenting  Ventricular tachycardia-treated via device  Implantable defibrillator-[employer]tronic  Treated hyperthyroidism/hypothryoidism  Congestive heart failure-chronic sys  Hypertransaminasemia normalized off amiodarone  PVCs  Low voltage ECG   VT detection set @ 150   She has had interval treated ventricular tachycardia.  There have been multiple morphologies of nonsustained VT.  She remains hyperthyroid.  Drug options for her ventricular tachycardia are limited.  For now, we will stop her carvedilol and try her on Inderal LA with its late acting sodium channel blocking properties.  We will also ask her to check with her pharmacy as to the costs of mexiletine and dronaderone  which we could potentially use.  Her QT interval precludes sotalol and or dofetilide, as well as any of the 1As.  Hopefully once she is euthyroid she will have less ventricular tachycardia although not altogether sanguine  She is euvolemic. Reviewed Dr. Cordelia Pen note.  She was on thyroid replacement as of 12/20, this not withstanding that the amiodarone had been discontinued 6/20.  I suspect that her current hyperthyroidism may well be iatrogenic from thyroid replacement and not so much from amiodarone.  We will recheck thyroid today since they are available for Dr. Loanne Drilling who is scheduled to see her tomorrow.  Her low voltage is concerning for amyloid particularly given symptoms of anemia and some orthostatic lightheadedness.  Probably appropriate at this point to get a PYP scan

## 2019-03-28 NOTE — Patient Instructions (Addendum)
Medication Instructions:  - Your physician has recommended you make the following change in your medication:   1) Stop coreg (carvedilol)  2) Start inderal LA (propranolol) 120 mg once daily  3) Please check with your pharmacy about the cost of: - Mexiletine 200 mg: take 1 tablet TWICE daily - Multaq 400 mg: take 1 tablet TWICE daily  *If you need a refill on your cardiac medications before your next appointment, please call your pharmacy*   Lab Work: - none ordered  If you have labs (blood work) drawn today and your tests are completely normal, you will receive your results only by: Marland Kitchen MyChart Message (if you have MyChart) OR . A paper copy in the mail If you have any lab test that is abnormal or we need to change your treatment, we will call you to review the results.   Testing/Procedures: - Your physician has recommended that you have a Nuclear Medicine Amyloid Scan (PYP scan)  - there are no special instructions aside from do not wear any perfumes/ fragrances/ deodarant  - testing is done at our [facility] office [address]  - Thursday 03/30/19 , arrive at 12:30 pm  - if you need to cancel/ reschedule please call [phone]  Follow-Up: At [facility], you and your health needs are our priority.  As part of our continuing mission to provide you with exceptional heart care, we have created designated Provider Care Teams.  These Care Teams include your primary Cardiologist (physician) and Advanced Practice Providers (APPs -  Physician Assistants and Nurse Practitioners) who all work together to provide you with the care you need, when you need it.  We recommend signing up for the patient portal called "MyChart".  Sign up information is provided on this After Visit Summary.  MyChart is used to connect with patients for Virtual Visits (Telemedicine).  Patients are able to view lab/test results, encounter notes, upcoming appointments, etc.  Non-urgent messages  can be sent to your provider as well.   To learn more about what you can do with MyChart, go to [email or web address].    Your next appointment:   3 month(s)  The format for your next appointment:   In Person  Provider:   Sherryl Manges, MD   Other Instructions N/a

## 2019-03-29 LAB — CUP PACEART INCLINIC DEVICE CHECK
Battery Remaining Longevity: 27 mo
Battery Voltage: 2.95 V
Brady Statistic AP VP Percent: 0 %
Brady Statistic AP VS Percent: 0 %
Brady Statistic AS VP Percent: 0.05 %
Brady Statistic AS VS Percent: 99.94 %
Brady Statistic RA Percent Paced: 0.01 %
Brady Statistic RV Percent Paced: 0.05 %
Date Time Interrogation Session: 20210323101600
HighPow Impedance: 69 Ohm
Implantable Lead Implant Date: 20140718
Implantable Lead Implant Date: 20140718
Implantable Lead Location: 753859
Implantable Lead Location: 753860
Implantable Lead Model: 5076
Implantable Lead Model: 6935
Implantable Pulse Generator Implant Date: 20140718
Lead Channel Impedance Value: 323 Ohm
Lead Channel Impedance Value: 380 Ohm
Lead Channel Impedance Value: 437 Ohm
Lead Channel Pacing Threshold Amplitude: 0.5 V
Lead Channel Pacing Threshold Amplitude: 0.75 V
Lead Channel Pacing Threshold Pulse Width: 0.4 ms
Lead Channel Pacing Threshold Pulse Width: 0.4 ms
Lead Channel Sensing Intrinsic Amplitude: 14.625 mV
Lead Channel Sensing Intrinsic Amplitude: 3.875 mV
Lead Channel Setting Pacing Amplitude: 2 V
Lead Channel Setting Pacing Amplitude: 2.5 V
Lead Channel Setting Pacing Pulse Width: 0.4 ms
Lead Channel Setting Sensing Sensitivity: 0.3 mV

## 2019-03-30 ENCOUNTER — Encounter (HOSPITAL_COMMUNITY): Payer: Medicare Other

## 2019-03-31 ENCOUNTER — Telehealth: Payer: Self-pay | Admitting: Internal Medicine

## 2019-03-31 NOTE — Telephone Encounter (Signed)
Lets try mexilitene  then 200 bid Heather Thanks SK

## 2019-03-31 NOTE — Telephone Encounter (Signed)
Darl Pikes, notification representative with [employer] states she is calling to report that patient has not been approved for [employer] [facility] (CPT Code: 12527).   Please return call to [employer] to discuss at [phone] (opt#: 3).   Case #: [id number]

## 2019-04-04 MED ORDER — MEXILETINE HCL 200 MG PO CAPS: 200.0000 mg | ORAL_CAPSULE | Freq: Two times a day (BID) | ORAL | 6 refills | Status: DC

## 2019-04-04 NOTE — Telephone Encounter (Signed)
I spoke with the patient.  I advised her of Dr. Odessa Fleming recommendations to start: 1) Mexiletine 200 mg BID  The patient voices understanding and is agreeable. Confirmed her [facility] as [facility] on [address]

## 2019-04-04 NOTE — Telephone Encounter (Signed)
To Dr. Klein to review. 

## 2019-04-04 NOTE — Telephone Encounter (Signed)
I spoke with the patient today and advised her Dr. Graciela Husbands will need to most likely do a peer to peer review for her nuclear amyloid scan.  She is aware I will call her back once Dr. Graciela Husbands has spoken with her insurance.  The patient voices understanding and is agreeable.

## 2019-04-11 ENCOUNTER — Other Ambulatory Visit: Payer: Self-pay

## 2019-04-13 ENCOUNTER — Other Ambulatory Visit: Payer: Self-pay

## 2019-04-13 ENCOUNTER — Ambulatory Visit (INDEPENDENT_AMBULATORY_CARE_PROVIDER_SITE_OTHER): Payer: Medicare Other | Admitting: Endocrinology

## 2019-04-13 ENCOUNTER — Telehealth: Payer: Self-pay

## 2019-04-13 ENCOUNTER — Encounter: Payer: Self-pay | Admitting: Endocrinology

## 2019-04-13 ENCOUNTER — Other Ambulatory Visit: Payer: Self-pay | Admitting: Internal Medicine

## 2019-04-13 VITALS — BP 124/70 | HR 88 | Ht 60.0 in | Wt 209.0 lb

## 2019-04-13 DIAGNOSIS — E0590 Thyrotoxicosis, unspecified without thyrotoxic crisis or storm: Secondary | ICD-10-CM

## 2019-04-13 LAB — T4, FREE: Free T4: 1.06 ng/dL (ref 0.60–1.60)

## 2019-04-13 LAB — TSH: TSH: 0.12 u[IU]/mL — ABNORMAL LOW (ref 0.35–4.50)

## 2019-04-13 NOTE — Patient Instructions (Signed)
Blood tests are requested for you today.  We'll let you know about the results.  If ever you have fever while taking methimazole, stop it and call us, even if the reason is obvious, because of the risk of a rare side-effect. It is best to never miss the medication.  However, if you do miss it, next best is to double up the next time.   Please come back for a follow-up appointment in 1 month.   

## 2019-04-13 NOTE — Progress Notes (Signed)
Subjective:    Patient ID: Erin [name], female    DOB: 08-07-1945, 74 y.o.   MRN: [id number]  HPI Pt returns for f/u of abnormal thyroid function (dx'ed whlie on amiodarone, which she took 2014-2020; in 2015, she had slightly elevated TSH; several measurements more recently were low; she has never had thyroid imaging; tapazole was resumed in 2018, but then stopped in late 2019, due to hypothyroidism; synthroid was started in early 2020; synthroid was stopped 12/20 due to low TSH; amiodarone was stopped in 2020; tapazole was started in preparation for DC cardioversion, which she had on 1/21.  pt states she feels well in general.  Specifically, she denies palpitations and tremor.  She has slight doe.  Pt says she seldom misses the tapazole.  Past Medical History:  Diagnosis Date  . Anginal pain (HCC)   . Automatic implantable cardioverter-defibrillator in situ    a. s/p MDT ICD 07/2012; b. SN # [id number]   . CAD (coronary artery disease)    a. cath 2003: LM nl, mid LAD 50%, LCx nl, RCA nl b. L&RHC 08/17/2014 100% occluded mid LCx, 70% mid LAD with FFR 0.82. Medical therapy. CI 2.1. CO 4.01c. LHC 8/18: CTO LCx w/ R-L colatts, LAD s/p PCI/DES x 2  . Chronic systolic CHF (congestive heart failure) (HCC)    a. echo 2014: EF 25%, diffuse HK, LA moderately dilated, RA mildly dilated, PASP 38 mm Hg; b. echo 08/2014: EF 25-30%, diffuse HK, RWMA cannot be excluded, mild MR, mild biatrial enlargement, RV mildly dilated, wall thickness nl, pacer wire/catheter noted, mild to mod TR, PASP high nl, c. TTE 8/18: EF 25-30%, diffuse HK, GR1DD, mod MR, mod dilated LA, nl RV sys fxn, PASP 56  . Encounter for long-term (current) use of anticoagulants   . Exertional shortness of breath   . H/O medication noncompliance   . High cholesterol   . Hypertension   . NICM (nonischemic cardiomyopathy) (HCC)    a. s/p AICD 07/22/2012  . Obesity   . OSA on CPAP    "suppose to; not often" (07/22/2012)  . Other  "heavy-for-dates" infants   . PAF (paroxysmal atrial fibrillation) (HCC)    a. on warfarin--> Eliquis 08/2014    Past Surgical History:  Procedure Laterality Date  . ABDOMINAL HYSTERECTOMY  ~ 1998   "laparoscopic" (07/22/2012)  . CARDIAC CATHETERIZATION  ? 1990's  . CARDIAC CATHETERIZATION N/A 08/17/2014   Procedure: Right/Left Heart Cath and Coronary Angiography;  Surgeon: Kathleene Hazel, MD;  Location: [facility] INVASIVE CV LAB;  Service: Cardiovascular;  Laterality: N/A;  . CARDIAC DEFIBRILLATOR PLACEMENT  07/22/2012   dual-chamber ICD.  Marland Kitchen CARDIOVERSION N/A 01/13/2019   Procedure: CARDIOVERSION;  Surgeon: Antonieta Iba, MD;  Location: [facility] ORS;  Service: Cardiovascular;  Laterality: N/A;  . CORONARY STENT INTERVENTION N/A 08/24/2016   Procedure: CORONARY STENT INTERVENTION;  Surgeon: Iran Ouch, MD;  Location: [facility] INVASIVE CV LAB;  Service: Cardiovascular;  Laterality: N/A;  . IMPLANTABLE CARDIOVERTER DEFIBRILLATOR IMPLANT N/A 07/22/2012   Procedure: IMPLANTABLE CARDIOVERTER DEFIBRILLATOR IMPLANT;  Surgeon: Marinus Maw, MD;  Location: [facility] CATH LAB;  Service: Cardiovascular;  Laterality: N/A;  . LEFT HEART CATH AND CORONARY ANGIOGRAPHY N/A 08/24/2016   Procedure: LEFT HEART CATH AND CORONARY ANGIOGRAPHY;  Surgeon: Iran Ouch, MD;  Location: [facility] INVASIVE CV LAB;  Service: Cardiovascular;  Laterality: N/A;  . TEE WITHOUT CARDIOVERSION N/A 08/21/2014   Procedure: TRANSESOPHAGEAL ECHOCARDIOGRAM (TEE);  Surgeon: Chilton Si, MD;  [facility] ENDOSCOPY;  Service: Cardiovascular;  Laterality: N/A;  . TUBAL LIGATION  ~ 1978    Social History   Socioeconomic History  . Marital status: Single    Spouse name: Not on file  . Number of children: 3  . Years of education: Not on file  . Highest education level: Not on file  Occupational History  . Occupation: Retired    Associate Professor: [employer]  Tobacco Use  . Smoking status: Never Smoker  . Smokeless tobacco: Never Used   Substance and Sexual Activity  . Alcohol use: No  . Drug use: No  . Sexual activity: Never  Other Topics Concern  . Not on file  Social History Narrative  . Not on file   Social Determinants of Health   Financial Resource Strain:   . Difficulty of Paying Living Expenses:   Food Insecurity:   . Worried About Programme researcher, broadcasting/film/video in the Last Year:   . Barista in the Last Year:   Transportation Needs:   . Freight forwarder (Medical):   Marland Kitchen Lack of Transportation (Non-Medical):   Physical Activity:   . Days of Exercise per Week:   . Minutes of Exercise per Session:   Stress:   . Feeling of Stress :   Social Connections:   . Frequency of Communication with Friends and Family:   . Frequency of Social Gatherings with Friends and Family:   . Attends Religious Services:   . Active Member of Clubs or Organizations:   . Attends Banker Meetings:   Marland Kitchen Marital Status:   Intimate Partner Violence:   . Fear of Current or Ex-Partner:   . Emotionally Abused:   Marland Kitchen Physically Abused:   . Sexually Abused:     Current Outpatient Medications on File Prior to Visit  Medication Sig Dispense Refill  . clopidogrel (PLAVIX) 75 MG tablet Take 1 tablet by mouth once daily with breakfast 90 tablet 0  . ELIQUIS 5 MG TABS tablet Take 1 tablet by mouth twice daily (Patient taking differently: Take 5 mg by mouth 2 (two) times daily. ) 60 tablet 5  . methimazole (TAPAZOLE) 10 MG tablet Take 4 tablets (40 mg total) by mouth 2 (two) times daily. 240 tablet 5  . mexiletine (MEXITIL) 200 MG capsule Take 1 capsule (200 mg total) by mouth 2 (two) times daily. 60 capsule 6  . propranolol ER (INDERAL LA) 120 MG 24 hr capsule Take 1 capsule (120 mg total) by mouth daily. 30 capsule 6  . spironolactone (ALDACTONE) 25 MG tablet Take 1/2 (one-half) tablet by mouth once daily 45 tablet 3  . torsemide (DEMADEX) 20 MG tablet Take 2 tablets (40 mg total) by mouth daily.    Marland Kitchen losartan (COZAAR) 50 MG  tablet Take 1 tablet (50 mg total) by mouth daily. 30 tablet 6   No current facility-administered medications on file prior to visit.    No Known Allergies  Family History  Problem Relation Age of Onset  . Heart disease Mother   . Heart disease Father   . Lung disease Sister   . Diabetes Sister   . Heart failure Brother   . Thyroid disease Neg Hx     BP 124/70   Pulse 88   Ht 5' (1.524 m)   Wt 209 lb (94.8 kg)   SpO2 90%   BMI 40.82 kg/m    Review of Systems Denies fever    Objective:   Physical Exam  VITAL SIGNS:  See vs page GENERAL: no distress NECK: There is no palpable thyroid enlargement.  No thyroid nodule is palpable.  No palpable lymphadenopathy at the anterior neck.   Lab Results  Component Value Date   TSH 0.12 (L) 04/13/2019       Assessment & Plan:  Hyperthyroidism: improved AF: we need to continue max tapazole Noncompliance with medication: this is proven by failure of TFT to normalize.  However, this seems improved.    Patient Instructions  Blood tests are requested for you today.  We'll let you know about the results.  If ever you have fever while taking methimazole, stop it and call us, even if the reason is obvious, because of the risk of a rare side-effect.  It is best to never miss the medication.  However, if you do miss it, next best is to double up the next time.   Please come back for a follow-up appointment in 1 month.

## 2019-04-13 NOTE — Telephone Encounter (Signed)
-----   Message from Romero Belling, MD sent at 04/13/2019  2:58 PM EDT ----- please contact patient: Improved, but not back to normal yet.  Please continue the same medication.  I'll see you next time.

## 2019-04-13 NOTE — Telephone Encounter (Signed)
Pt returned call. Informed about lab results and new orders. Verbalized acceptance and understanding. 

## 2019-04-13 NOTE — Telephone Encounter (Signed)
LAB RESULTS  Lab results were reviewed by Dr. Ellison. Called pt to inform about lab results as well as new orders. LVM requesting returned call. 

## 2019-04-21 ENCOUNTER — Other Ambulatory Visit: Payer: Self-pay | Admitting: Cardiovascular Disease

## 2019-04-21 NOTE — Telephone Encounter (Signed)
Patient will need an appointment with [facility] for further refills, Thanks !

## 2019-04-21 NOTE — Telephone Encounter (Signed)
Scheduled

## 2019-04-29 NOTE — Progress Notes (Signed)
Date:  04/29/2019   ID:  Erin [name], DOB [birth year], MRN [id number]  Patient Location:  [phone] [place] [address]   Provider location:   [facility], [place] office  PCP:  Patient, No Pcp Per  Cardiologist:  Arvid Right Heart Chief Complaint  Patient presents with  . other    3 month follow up. Meds reviewed by the pt. verbally. "doing well."     History of Present Illness:    Erin [name] is a 74 y.o. female past medical history of coronary artery disease,  paroxysmal atrial fibrillation,  chronic systolic CHF   Medtronic ICD placed July 2014 lost to follow-up for the past 2 years, Recently ran out of her medications who presented to [facility] 08/20/2016  after episode of weakness, ICD shock while she was at the supermarket Ejection fraction 25 to 30% in November 2018 Right heart pressures of 70 at that time PCI of mid LAD disease, 2 drug-eluting stents. 08/2016 occluded left circumflex with right-to-left collaterals.  She presents for follow-up of her cardiomyopathy, sustained VT   Cardioversion 01/2019 for atrial fib   Amio was stopped 2/2 amio assoc hyperthyroidism--Rx with methimazole.. Became hypothyroid and was treated with levothyroxine   Labs reviewed TSH low <0.01, now 0.12 CR 1.09 On tapaxole/methimazole  treated ventricular tachycardia. On download Followed by Dr. Caryl Comes Drug options for her ventricular tachycardia are limited.  Off coreg,started on prorpnaolol On mexilatine PYP scan ordered by Dr. Caryl Comes  Weight stable torsemide 40 daily CR 1.09 in Jan 2021  EKG personally reviewed by myself on todays visit Shows normal sinus rhythm rate 91 bpm nonspecific T wave abnormality  Past medical history reviewed  August 2018 Medtronic ICD device interrogated showing appropriate shock for VT  numerous episodes of VT with rate 167 up to 200 bpm, no therapy given Device reprogrammed 08/20/2016 Started on amiodarone  infusion, Echocardiogram at that time ejection fraction 25% with dilated LV, global hypokinesis Stress test results discussed with her in detail, anterior wall ischemia  cardiac catheterization showed significant two-vessel coronary artery disease with known chronically occluded left circumflex with right-to-left collaterals. There was progression of mid LAD disease.  -- treated successfully with PCI and 2 overlapped drug-eluting stent placement.  First diagonal was jailed by the stent with sluggish flow but no significant ostial stenosis.    Prior CV studies:   The following studies were reviewed today:    Past Medical History:  Diagnosis Date  . Anginal pain ([place])   . Automatic implantable cardioverter-defibrillator in situ    a. s/p MDT ICD 07/2012; b. SN # PJN [id number]   . CAD (coronary artery disease)    a. cath 2003: LM nl, mid LAD 50%, LCx nl, RCA nl b. L&RHC 08/17/2014 100% occluded mid LCx, 70% mid LAD with FFR 0.82. Medical therapy. CI 2.1. CO 4.01c. LHC 8/18: CTO LCx w/ R-L colatts, LAD s/p PCI/DES x 2  . Chronic systolic CHF (congestive heart failure) ([place])    a. echo 2014: EF 25%, diffuse HK, LA moderately dilated, RA mildly dilated, PASP 38 mm Hg; b. echo 08/2014: EF 25-30%, diffuse HK, RWMA cannot be excluded, mild MR, mild biatrial enlargement, RV mildly dilated, wall thickness nl, pacer wire/catheter noted, mild to mod TR, PASP high nl, c. TTE 8/18: EF 25-30%, diffuse HK, GR1DD, mod MR, mod dilated LA, nl RV sys fxn, PASP 56  . Encounter for long-term (current) use of anticoagulants   .  Exertional shortness of breath   . H/O medication noncompliance   . High cholesterol   . Hypertension   . NICM (nonischemic cardiomyopathy) (HCC)    a. s/p AICD 07/22/2012  . Obesity   . OSA on CPAP    "suppose to; not often" (07/22/2012)  . Other "heavy-for-dates" infants   . PAF (paroxysmal atrial fibrillation) (HCC)    a. on warfarin--> Eliquis 08/2014   Past Surgical History:   Procedure Laterality Date  . ABDOMINAL HYSTERECTOMY  ~ 1998   "laparoscopic" (07/22/2012)  . CARDIAC CATHETERIZATION  ? 1990's  . CARDIAC CATHETERIZATION N/A 08/17/2014   Procedure: Right/Left Heart Cath and Coronary Angiography;  Surgeon: Kathleene Hazel, MD;  Location: [facility] INVASIVE CV LAB;  Service: Cardiovascular;  Laterality: N/A;  . CARDIAC DEFIBRILLATOR PLACEMENT  07/22/2012   dual-chamber ICD.  Marland Kitchen CARDIOVERSION N/A 01/13/2019   Procedure: CARDIOVERSION;  Surgeon: Antonieta Iba, MD;  Location: [facility] ORS;  Service: Cardiovascular;  Laterality: N/A;  . CORONARY STENT INTERVENTION N/A 08/24/2016   Procedure: CORONARY STENT INTERVENTION;  Surgeon: Iran Ouch, MD;  Location: [facility] INVASIVE CV LAB;  Service: Cardiovascular;  Laterality: N/A;  . IMPLANTABLE CARDIOVERTER DEFIBRILLATOR IMPLANT N/A 07/22/2012   Procedure: IMPLANTABLE CARDIOVERTER DEFIBRILLATOR IMPLANT;  Surgeon: Marinus Maw, MD;  Location: [facility] CATH LAB;  Service: Cardiovascular;  Laterality: N/A;  . LEFT HEART CATH AND CORONARY ANGIOGRAPHY N/A 08/24/2016   Procedure: LEFT HEART CATH AND CORONARY ANGIOGRAPHY;  Surgeon: Iran Ouch, MD;  Location: [facility] INVASIVE CV LAB;  Service: Cardiovascular;  Laterality: N/A;  . TEE WITHOUT CARDIOVERSION N/A 08/21/2014   Procedure: TRANSESOPHAGEAL ECHOCARDIOGRAM (TEE);  Surgeon: Chilton Si, MD;  Location: [facility] ENDOSCOPY;  Service: Cardiovascular;  Laterality: N/A;  . TUBAL LIGATION  ~ 1978     No outpatient medications have been marked as taking for the 05/02/19 encounter (Appointment) with Antonieta Iba, MD.     Allergies:   Patient has no known allergies.   Social History   Tobacco Use  . Smoking status: Never Smoker  . Smokeless tobacco: Never Used  Substance Use Topics  . Alcohol use: No  . Drug use: No     Current Outpatient Medications on File Prior to Visit  Medication Sig Dispense Refill  . atorvastatin (LIPITOR) 40 MG tablet TAKE 1 TABLET BY MOUTH  ONCE DAILY AT  6PM 90 tablet 3  . clopidogrel (PLAVIX) 75 MG tablet Take 1 tablet by mouth once daily with breakfast 30 tablet 0  . ELIQUIS 5 MG TABS tablet Take 1 tablet by mouth twice daily (Patient taking differently: Take 5 mg by mouth 2 (two) times daily. ) 60 tablet 5  . losartan (COZAAR) 50 MG tablet Take 1 tablet (50 mg total) by mouth daily. 30 tablet 6  . methimazole (TAPAZOLE) 10 MG tablet Take 4 tablets (40 mg total) by mouth 2 (two) times daily. 240 tablet 5  . mexiletine (MEXITIL) 200 MG capsule Take 1 capsule (200 mg total) by mouth 2 (two) times daily. 60 capsule 6  . propranolol ER (INDERAL LA) 120 MG 24 hr capsule Take 1 capsule (120 mg total) by mouth daily. 30 capsule 6  . spironolactone (ALDACTONE) 25 MG tablet Take 1/2 (one-half) tablet by mouth once daily 45 tablet 3  . torsemide (DEMADEX) 20 MG tablet Take 2 tablets (40 mg total) by mouth daily.     No current facility-administered medications on file prior to visit.     Family Hx: The patient's family  history includes Diabetes in her sister; Heart disease in her father and mother; Heart failure in her brother; Lung disease in her sister. There is no history of Thyroid disease.  ROS:   Please see the history of present illness.    Review of Systems  Constitutional: Positive for weight loss.  Respiratory: Negative.   Cardiovascular: Negative.   Gastrointestinal: Negative.   Musculoskeletal: Negative.   Neurological: Negative.   Psychiatric/Behavioral: Negative.   All other systems reviewed and are negative.     Labs/Other Tests and Data Reviewed:    Recent Labs: 07/09/2018: B Natriuretic Peptide 148.0 01/10/2019: BUN 20; Creatinine, Ser 1.09; Hemoglobin 10.5; Platelets 285; Potassium 3.8; Sodium 136 01/24/2019: ALT 12 04/13/2019: TSH 0.12   Recent Lipid Panel Lab Results  Component Value Date/Time   CHOL 130 08/21/2014 04:34 AM   TRIG 72 08/21/2014 04:34 AM   HDL 25 (L) 08/21/2014 04:34 AM   CHOLHDL 5.2  08/21/2014 04:34 AM   LDLCALC 91 08/21/2014 04:34 AM    Wt Readings from Last 3 Encounters:  04/13/19 209 lb (94.8 kg)  03/28/19 210 lb (95.3 kg)  03/13/19 207 lb (93.9 kg)     Exam:    BP 120/70 (BP Location: Left Arm, Patient Position: Sitting, Cuff Size: Normal)   Pulse 91   Ht 5' (1.524 m)   Wt 207 lb 4 oz (94 kg)   SpO2 96%   BMI 40.48 kg/m  Constitutional:  oriented to person, place, and time. No distress.  HENT:  Head: Grossly normal Eyes:  no discharge. No scleral icterus.  Neck: No JVD, no carotid bruits  Cardiovascular: Regular rate and rhythm, no murmurs appreciated Pulmonary/Chest: Clear to auscultation bilaterally, no wheezes or rails Abdominal: Soft.  no distension.  no tenderness.  Musculoskeletal: Normal range of motion Neurological:  normal muscle tone. Coordination normal. No atrophy Skin: Skin warm and dry Psychiatric: normal affect, pleasant  ASSESSMENT & PLAN:    Atherosclerosis of native coronary artery of native heart with stable angina pectoris (HCC) - Plan: Basic metabolic panel Currently with no symptoms of angina. No further workup at this time. Continue current medication regimen.  Chronic systolic CHF (congestive heart failure) (HCC)  We will continue torsemide 40 daily Weight stable Sedentary, no exercise program Discussed moderating salt and fluid intake  Essential hypertension Blood pressure is well controlled on today's visit. No changes made to the medications.  NICM (nonischemic cardiomyopathy) (HCC) Unable to afford Entresto continue losartan  spironolactone with her torsemide Also on propranolol mexiletine  Complex sleep apnea syndrome  Pulmonary HTN (HCC) Dramatic improvement in her symptoms with torsemide in place of Lasix Continue plan as above  PAF (paroxysmal atrial fibrillation) (HCC) Amiodarone held, now on mexiletine propranolol  Hyperthyroidism Managed by endocrine Reports she is asymptomatic  Ventricular  tachycardia (HCC) Managed by Dr. Graciela Husbands On mexiletine, propranolol  Atrial fibrillation Cardioversion January 2021 Amiodarone held On propranolol, mexiletine   Total encounter time more than 45 minutes  Greater than 50% was spent in counseling and coordination of care with the patient   Disposition: Follow-up in 6 months   Signed, Julien Nordmann, MD  04/29/2019 11:03 PM    [facility] [address], [place], [place] [address]

## 2019-05-02 ENCOUNTER — Other Ambulatory Visit: Payer: Self-pay

## 2019-05-02 ENCOUNTER — Encounter: Payer: Self-pay | Admitting: Cardiovascular Disease

## 2019-05-02 ENCOUNTER — Ambulatory Visit (INDEPENDENT_AMBULATORY_CARE_PROVIDER_SITE_OTHER): Payer: Medicare Other | Admitting: Cardiovascular Disease

## 2019-05-02 VITALS — BP 120/70 | HR 91 | Ht 60.0 in | Wt 207.2 lb

## 2019-05-02 DIAGNOSIS — I472 Ventricular tachycardia, unspecified: Secondary | ICD-10-CM

## 2019-05-02 DIAGNOSIS — I25118 Atherosclerotic heart disease of native coronary artery with other forms of angina pectoris: Secondary | ICD-10-CM

## 2019-05-02 DIAGNOSIS — I428 Other cardiomyopathies: Secondary | ICD-10-CM | POA: Diagnosis not present

## 2019-05-02 DIAGNOSIS — Z95810 Presence of automatic (implantable) cardiac defibrillator: Secondary | ICD-10-CM

## 2019-05-02 DIAGNOSIS — I4819 Other persistent atrial fibrillation: Secondary | ICD-10-CM

## 2019-05-02 DIAGNOSIS — I5022 Chronic systolic (congestive) heart failure: Secondary | ICD-10-CM

## 2019-05-02 DIAGNOSIS — I272 Pulmonary hypertension, unspecified: Secondary | ICD-10-CM

## 2019-05-02 NOTE — Patient Instructions (Addendum)
Labs today: lipids,    Medication Instructions:  No changes  If you need a refill on your cardiac medications before your next appointment, please call your pharmacy.    Lab work: No new labs needed   If you have labs (blood work) drawn today and your tests are completely normal, you will receive your results only by: Marland Kitchen MyChart Message (if you have MyChart) OR . A paper copy in the mail If you have any lab test that is abnormal or we need to change your treatment, we will call you to review the results.   Testing/Procedures: No new testing needed   Follow-Up: At [facility], you and your health needs are our priority.  As part of our continuing mission to provide you with exceptional heart care, we have created designated Provider Care Teams.  These Care Teams include your primary Cardiologist (physician) and Advanced Practice Providers (APPs -  Physician Assistants and Nurse Practitioners) who all work together to provide you with the care you need, when you need it.  . You will need a follow up appointment in 12 months  . Providers on your designated Care Team:   . Nicolasa Ducking, NP . Eula Listen, PA-C . Marisue Ivan, PA-C  Any Other Special Instructions Will Be Listed Below (If Applicable).  For educational health videos Log in to : [email or web address] Or : [email or web address], password : [email or web address]

## 2019-05-03 LAB — LIPID PANEL
Chol/HDL Ratio: 3.3 ratio (ref 0.0–4.4)
Cholesterol, Total: 154 mg/dL (ref 100–199)
HDL: 47 mg/dL (ref >39)
LDL Chol Calc (NIH): 87 mg/dL (ref 0–99)
Triglycerides: 113 mg/dL (ref 0–149)
VLDL Cholesterol Cal: 20 mg/dL (ref 5–40)

## 2019-05-11 ENCOUNTER — Telehealth: Payer: Self-pay

## 2019-05-11 DIAGNOSIS — I25118 Atherosclerotic heart disease of native coronary artery with other forms of angina pectoris: Secondary | ICD-10-CM

## 2019-05-11 MED ORDER — EZETIMIBE 10 MG PO TABS
10.0000 mg | ORAL_TABLET | Freq: Every day | ORAL | 3 refills | Status: DC
Start: 2019-05-11 — End: 2020-02-05

## 2019-05-11 NOTE — Telephone Encounter (Signed)
-----   Message from Antonieta Iba, MD sent at 05/10/2019  3:01 PM EDT ----- Lipid panel LDL above goal at 87, we need less than 70 Given known coronary disease prior stenting can we add Zetia to her Lipitor

## 2019-05-11 NOTE — Telephone Encounter (Signed)
Call to patient to review labs.    Pt verbalized understanding and has no further questions at this time.    Advised pt to call for any further questions or concerns.  Rx ordered. [facility] in 6-8 weeks.

## 2019-05-12 NOTE — Telephone Encounter (Signed)
Dr. Graciela Husbands, are you done with this?

## 2019-05-16 ENCOUNTER — Other Ambulatory Visit: Payer: Self-pay

## 2019-05-18 ENCOUNTER — Ambulatory Visit: Payer: Medicare Other | Admitting: Endocrinology

## 2019-05-23 ENCOUNTER — Other Ambulatory Visit: Payer: Self-pay | Admitting: Cardiovascular Disease

## 2019-05-25 ENCOUNTER — Other Ambulatory Visit: Payer: Self-pay

## 2019-05-29 ENCOUNTER — Other Ambulatory Visit: Payer: Self-pay

## 2019-05-29 ENCOUNTER — Ambulatory Visit (INDEPENDENT_AMBULATORY_CARE_PROVIDER_SITE_OTHER): Payer: Medicare Other | Admitting: Endocrinology

## 2019-05-29 ENCOUNTER — Encounter: Payer: Self-pay | Admitting: Endocrinology

## 2019-05-29 VITALS — BP 108/70 | HR 84 | Ht 60.0 in | Wt 201.0 lb

## 2019-05-29 DIAGNOSIS — E0590 Thyrotoxicosis, unspecified without thyrotoxic crisis or storm: Secondary | ICD-10-CM

## 2019-05-29 LAB — TSH: TSH: 2.07 u[IU]/mL (ref 0.35–4.50)

## 2019-05-29 LAB — T4, FREE: Free T4: 0.85 ng/dL (ref 0.60–1.60)

## 2019-05-29 NOTE — Progress Notes (Signed)
Subjective:    Patient ID: Erin [name], female    DOB: September 27, 1945, 74 y.o.   MRN: [id number]  HPI Pt returns for f/u of abnormal thyroid function (dx'ed whlie on amiodarone, which she took 2014-2020; in 2015, she had slightly elevated TSH; several measurements more recently were low; she has never had thyroid imaging; tapazole was resumed in 2018, but then stopped in late 2019, due to hypothyroidism; synthroid was started in early 2020; synthroid was stopped 12/20 due to low TSH; amiodarone was stopped in 2020; tapazole was started in preparation for DC cardioversion, which she had on 1/21.  pt states she feels well in general.  Specifically, she denies palpitations and tremor.  Pt says she seldom misses the tapazole.   Past Medical History:  Diagnosis Date  . Anginal pain (HCC)   . Automatic implantable cardioverter-defibrillator in situ    a. s/p MDT ICD 07/2012; b. SN # [id number]   . CAD (coronary artery disease)    a. cath 2003: LM nl, mid LAD 50%, LCx nl, RCA nl b. L&RHC 08/17/2014 100% occluded mid LCx, 70% mid LAD with FFR 0.82. Medical therapy. CI 2.1. CO 4.01c. LHC 8/18: CTO LCx w/ R-L colatts, LAD s/p PCI/DES x 2  . Chronic systolic CHF (congestive heart failure) (HCC)    a. echo 2014: EF 25%, diffuse HK, LA moderately dilated, RA mildly dilated, PASP 38 mm Hg; b. echo 08/2014: EF 25-30%, diffuse HK, RWMA cannot be excluded, mild MR, mild biatrial enlargement, RV mildly dilated, wall thickness nl, pacer wire/catheter noted, mild to mod TR, PASP high nl, c. TTE 8/18: EF 25-30%, diffuse HK, GR1DD, mod MR, mod dilated LA, nl RV sys fxn, PASP 56  . Encounter for long-term (current) use of anticoagulants   . Exertional shortness of breath   . H/O medication noncompliance   . High cholesterol   . Hypertension   . NICM (nonischemic cardiomyopathy) (HCC)    a. s/p AICD 07/22/2012  . Obesity   . OSA on CPAP    "suppose to; not often" (07/22/2012)  . Other "heavy-for-dates" infants   .  PAF (paroxysmal atrial fibrillation) (HCC)    a. on warfarin--> Eliquis 08/2014    Past Surgical History:  Procedure Laterality Date  . ABDOMINAL HYSTERECTOMY  ~ 1998   "laparoscopic" (07/22/2012)  . CARDIAC CATHETERIZATION  ? 1990's  . CARDIAC CATHETERIZATION N/A 08/17/2014   Procedure: Right/Left Heart Cath and Coronary Angiography;  Surgeon: Kathleene Hazel, MD;  Location: [facility] INVASIVE CV LAB;  Service: Cardiovascular;  Laterality: N/A;  . CARDIAC DEFIBRILLATOR PLACEMENT  07/22/2012   dual-chamber ICD.  Marland Kitchen CARDIOVERSION N/A 01/13/2019   Procedure: CARDIOVERSION;  Surgeon: Antonieta Iba, MD;  Location: [facility] ORS;  Service: Cardiovascular;  Laterality: N/A;  . CORONARY STENT INTERVENTION N/A 08/24/2016   Procedure: CORONARY STENT INTERVENTION;  Surgeon: Iran Ouch, MD;  Location: [facility] INVASIVE CV LAB;  Service: Cardiovascular;  Laterality: N/A;  . IMPLANTABLE CARDIOVERTER DEFIBRILLATOR IMPLANT N/A 07/22/2012   Procedure: IMPLANTABLE CARDIOVERTER DEFIBRILLATOR IMPLANT;  Surgeon: Marinus Maw, MD;  Location: [facility] CATH LAB;  Service: Cardiovascular;  Laterality: N/A;  . LEFT HEART CATH AND CORONARY ANGIOGRAPHY N/A 08/24/2016   Procedure: LEFT HEART CATH AND CORONARY ANGIOGRAPHY;  Surgeon: Iran Ouch, MD;  Location: [facility] INVASIVE CV LAB;  Service: Cardiovascular;  Laterality: N/A;  . TEE WITHOUT CARDIOVERSION N/A 08/21/2014   Procedure: TRANSESOPHAGEAL ECHOCARDIOGRAM (TEE);  Surgeon: Chilton Si, MD;  Location: [facility] ENDOSCOPY;  Service:  Cardiovascular;  Laterality: N/A;  . TUBAL LIGATION  ~ 1978    Social History   Socioeconomic History  . Marital status: Single    Spouse name: Not on file  . Number of children: 3  . Years of education: Not on file  . Highest education level: Not on file  Occupational History  . Occupation: Retired    Associate Professor: [employer]  Tobacco Use  . Smoking status: Never Smoker  . Smokeless tobacco: Never Used  Substance and Sexual Activity   . Alcohol use: No  . Drug use: No  . Sexual activity: Never  Other Topics Concern  . Not on file  Social History Narrative  . Not on file   Social Determinants of Health   Financial Resource Strain:   . Difficulty of Paying Living Expenses:   Food Insecurity:   . Worried About Programme researcher, broadcasting/film/video in the Last Year:   . Barista in the Last Year:   Transportation Needs:   . Freight forwarder (Medical):   Marland Kitchen Lack of Transportation (Non-Medical):   Physical Activity:   . Days of Exercise per Week:   . Minutes of Exercise per Session:   Stress:   . Feeling of Stress :   Social Connections:   . Frequency of Communication with Friends and Family:   . Frequency of Social Gatherings with Friends and Family:   . Attends Religious Services:   . Active Member of Clubs or Organizations:   . Attends Banker Meetings:   Marland Kitchen Marital Status:   Intimate Partner Violence:   . Fear of Current or Ex-Partner:   . Emotionally Abused:   Marland Kitchen Physically Abused:   . Sexually Abused:     Current Outpatient Medications on File Prior to Visit  Medication Sig Dispense Refill  . atorvastatin (LIPITOR) 40 MG tablet TAKE 1 TABLET BY MOUTH ONCE DAILY AT  6PM 90 tablet 3  . clopidogrel (PLAVIX) 75 MG tablet Take 1 tablet by mouth once daily with breakfast 30 tablet 5  . ELIQUIS 5 MG TABS tablet Take 1 tablet by mouth twice daily (Patient taking differently: Take 5 mg by mouth 2 (two) times daily. ) 60 tablet 5  . ezetimibe (ZETIA) 10 MG tablet Take 1 tablet (10 mg total) by mouth daily. 90 tablet 3  . methimazole (TAPAZOLE) 10 MG tablet Take 4 tablets (40 mg total) by mouth 2 (two) times daily. 240 tablet 5  . mexiletine (MEXITIL) 200 MG capsule Take 1 capsule (200 mg total) by mouth 2 (two) times daily. 60 capsule 6  . propranolol ER (INDERAL LA) 120 MG 24 hr capsule Take 1 capsule (120 mg total) by mouth daily. 30 capsule 6  . spironolactone (ALDACTONE) 25 MG tablet Take 1/2  (one-half) tablet by mouth once daily 45 tablet 3  . torsemide (DEMADEX) 20 MG tablet Take 2 tablets (40 mg total) by mouth daily.    Marland Kitchen losartan (COZAAR) 50 MG tablet Take 1 tablet (50 mg total) by mouth daily. 30 tablet 6   No current facility-administered medications on file prior to visit.    No Known Allergies  Family History  Problem Relation Age of Onset  . Heart disease Mother   . Heart disease Father   . Lung disease Sister   . Diabetes Sister   . Heart failure Brother   . Thyroid disease Neg Hx     BP 108/70   Pulse 84   Ht  5' (1.524 m)   Wt 201 lb (91.2 kg)   SpO2 96%   BMI 39.26 kg/m    Review of Systems Denies fever    Objective:   Physical Exam VITAL SIGNS:  See vs page GENERAL: no distress NECK: There is no palpable thyroid enlargement.  No thyroid nodule is palpable.  No palpable lymphadenopathy at the anterior neck.  Lab Results  Component Value Date   TSH 2.07 05/29/2019        Assessment & Plan:  Hyperthyroidism: well-controlled.  Please continue the same medication Please come back for a follow-up appointment in 6 weeks

## 2019-05-29 NOTE — Patient Instructions (Signed)
Blood tests are requested for you today.  We'll let you know about the results.     If ever you have fever while taking methimazole, stop it and call us, even if the reason is obvious, because of the risk of a rare side-effect.   It is best to never miss the medication.  However, if you do miss it, next best is to double up the next time.   Please come back for a follow-up appointment in 6 weeks.   

## 2019-05-30 ENCOUNTER — Telehealth: Payer: Self-pay

## 2019-05-30 NOTE — Telephone Encounter (Signed)
-----   Message from Romero Belling, MD sent at 05/29/2019  6:44 PM EDT ----- please contact patient: Normal this time--good.  Please continue the same medication for now.  I'll see you next time.  We may then be able to reduce the medication.

## 2019-05-30 NOTE — Telephone Encounter (Signed)
LAB RESULTS  Lab results were reviewed by Dr. Ellison. A letter has been mailed to pt home address. For future reference, letter can be found in Epic. 

## 2019-06-06 ENCOUNTER — Telehealth (HOSPITAL_COMMUNITY): Payer: Self-pay

## 2019-06-06 NOTE — Telephone Encounter (Signed)
Reminder of the patient's appointment left on her answering machine. Asked to call back with any questions. S.Harvest Deist EMTP

## 2019-06-08 ENCOUNTER — Ambulatory Visit (HOSPITAL_COMMUNITY): Payer: Medicare Other | Attending: Internal Medicine

## 2019-06-08 ENCOUNTER — Other Ambulatory Visit: Payer: Self-pay

## 2019-06-08 DIAGNOSIS — I472 Ventricular tachycardia, unspecified: Secondary | ICD-10-CM

## 2019-06-08 MED ORDER — TECHNETIUM TC 99M PYROPHOSPHATE
20.4000 | Freq: Once | INTRAVENOUS | Status: AC
  Administered 2019-06-08: 20.4 via INTRAVENOUS

## 2019-06-20 ENCOUNTER — Ambulatory Visit (INDEPENDENT_AMBULATORY_CARE_PROVIDER_SITE_OTHER): Payer: Medicare Other | Admitting: *Deleted

## 2019-06-20 DIAGNOSIS — I428 Other cardiomyopathies: Secondary | ICD-10-CM

## 2019-06-21 LAB — CUP PACEART REMOTE DEVICE CHECK
Battery Remaining Longevity: 32 mo
Battery Voltage: 2.95 V
Brady Statistic AP VP Percent: 0 %
Brady Statistic AP VS Percent: 0.09 %
Brady Statistic AS VP Percent: 0.04 %
Brady Statistic AS VS Percent: 99.87 %
Brady Statistic RA Percent Paced: 0.09 %
Brady Statistic RV Percent Paced: 0.04 %
Date Time Interrogation Session: 20210615083403
HighPow Impedance: 85 Ohm
Implantable Lead Implant Date: 20140718
Implantable Lead Implant Date: 20140718
Implantable Lead Location: 753859
Implantable Lead Location: 753860
Implantable Lead Model: 5076
Implantable Lead Model: 6935
Implantable Pulse Generator Implant Date: 20140718
Lead Channel Impedance Value: 342 Ohm
Lead Channel Impedance Value: 380 Ohm
Lead Channel Impedance Value: 437 Ohm
Lead Channel Pacing Threshold Amplitude: 0.5 V
Lead Channel Pacing Threshold Amplitude: 0.625 V
Lead Channel Pacing Threshold Pulse Width: 0.4 ms
Lead Channel Pacing Threshold Pulse Width: 0.4 ms
Lead Channel Sensing Intrinsic Amplitude: 15.125 mV
Lead Channel Sensing Intrinsic Amplitude: 15.125 mV
Lead Channel Sensing Intrinsic Amplitude: 4 mV
Lead Channel Sensing Intrinsic Amplitude: 4 mV
Lead Channel Setting Pacing Amplitude: 2 V
Lead Channel Setting Pacing Amplitude: 2.5 V
Lead Channel Setting Pacing Pulse Width: 0.4 ms
Lead Channel Setting Sensing Sensitivity: 0.3 mV

## 2019-06-21 NOTE — Progress Notes (Signed)
Remote ICD transmission.   

## 2019-06-22 ENCOUNTER — Other Ambulatory Visit
Admission: RE | Admit: 2019-06-22 | Discharge: 2019-06-22 | Disposition: A | Discharge status: Home / Self Care | Payer: Medicare Other | Attending: Cardiovascular Disease | Admitting: Cardiovascular Disease

## 2019-06-22 DIAGNOSIS — I25118 Atherosclerotic heart disease of native coronary artery with other forms of angina pectoris: Secondary | ICD-10-CM | POA: Insufficient documentation

## 2019-06-22 LAB — HEPATIC FUNCTION PANEL
ALT: 19 U/L (ref 0–44)
AST: 19 U/L (ref 15–41)
Albumin: 4.3 g/dL (ref 3.5–5.0)
Alkaline Phosphatase: 110 U/L (ref 38–126)
Bilirubin, Direct: 0.1 mg/dL (ref 0.0–0.2)
Indirect Bilirubin: 0.9 mg/dL (ref 0.3–0.9)
Total Bilirubin: 1 mg/dL (ref 0.3–1.2)
Total Protein: 7.9 g/dL (ref 6.5–8.1)

## 2019-06-22 LAB — LIPID PANEL
Cholesterol: 158 mg/dL (ref 0–200)
HDL: 57 mg/dL (ref >40)
LDL Cholesterol: 83 mg/dL (ref 0–99)
Total CHOL/HDL Ratio: 2.8 RATIO
Triglycerides: 90 mg/dL (ref <150)
VLDL: 18 mg/dL (ref 0–40)

## 2019-06-27 ENCOUNTER — Encounter: Payer: Self-pay | Admitting: Internal Medicine

## 2019-06-27 ENCOUNTER — Other Ambulatory Visit: Payer: Self-pay

## 2019-06-27 ENCOUNTER — Ambulatory Visit (INDEPENDENT_AMBULATORY_CARE_PROVIDER_SITE_OTHER): Payer: Medicare Other | Admitting: Internal Medicine

## 2019-06-27 VITALS — BP 130/70 | HR 72 | Ht 60.0 in | Wt 202.5 lb

## 2019-06-27 DIAGNOSIS — I5022 Chronic systolic (congestive) heart failure: Secondary | ICD-10-CM

## 2019-06-27 DIAGNOSIS — Z95810 Presence of automatic (implantable) cardiac defibrillator: Secondary | ICD-10-CM

## 2019-06-27 DIAGNOSIS — Z79899 Other long term (current) drug therapy: Secondary | ICD-10-CM

## 2019-06-27 DIAGNOSIS — I472 Ventricular tachycardia, unspecified: Secondary | ICD-10-CM

## 2019-06-27 DIAGNOSIS — I428 Other cardiomyopathies: Secondary | ICD-10-CM

## 2019-06-27 DIAGNOSIS — I4819 Other persistent atrial fibrillation: Secondary | ICD-10-CM

## 2019-06-27 NOTE — Patient Instructions (Signed)
Medication Instructions:  - Your physician recommends that you continue on your current medications as directed. Please refer to the Current Medication list given to you today.  *If you need a refill on your cardiac medications before your next appointment, please call your pharmacy*   Lab Work: - Your physician recommends that you have lab work : BMP/ CBC - we will send you out with written orders to have this done with Dr. George Hugh office at your next lab draw - if they will not draw this blood for any reason, please call the office and let us know [phone]  If you have labs (blood work) drawn today and your tests are completely normal, you will receive your results only by: Marland Kitchen MyChart Message (if you have MyChart) OR . A paper copy in the mail If you have any lab test that is abnormal or we need to change your treatment, we will call you to review the results.   Testing/Procedures: - none ordered   Follow-Up: At [facility], you and your health needs are our priority.  As part of our continuing mission to provide you with exceptional heart care, we have created designated Provider Care Teams.  These Care Teams include your primary Cardiologist (physician) and Advanced Practice Providers (APPs -  Physician Assistants and Nurse Practitioners) who all work together to provide you with the care you need, when you need it.  We recommend signing up for the patient portal called "MyChart".  Sign up information is provided on this After Visit Summary.  MyChart is used to connect with patients for Virtual Visits (Telemedicine).  Patients are able to view lab/test results, encounter notes, upcoming appointments, etc.  Non-urgent messages can be sent to your provider as well.   To learn more about what you can do with MyChart, go to [email or web address].    Your next appointment:   1 year(s)  The format for your next appointment:   In Person  Provider:   Sherryl Manges,  MD   Other Instructions n/a

## 2019-06-27 NOTE — Progress Notes (Signed)
Patient Care Team: Patient, No Pcp Per as PCP - General (General Practice) Delma Freeze, FNP as Nurse Practitioner (Cardiology) Duke Salvia, MD as Consulting Physician (Cardiology) Antonieta Iba, MD as Consulting Physician (Cardiology)   HPI  Erin [name] is a 74 y.o. female 018 on clopidogrel  Seen in followup for ICD implanted in  the context of a nonischemic and ischemic cardiomyopathy; stenting 2  She has chronic systolic heart failure related to  atrial fibrillation-persistent. She took amiodarone and apixoban.  Amio was stopped 2/2 amio assoc hyperthyroidism--Rx with methimazole.. Became hypothyroid and was treated with levothyroxine Followed by Dr Everardo All.     The patient denies chest pain, shortness of breath, nocturnal dyspnea, orthopnea or peripheral edema.  There have been no palpitations, lightheadedness or syncope.     no bleeding          Date Cr Hgb TSH ALT  8/18   4.9 16  11/18    2.13   12/19  11.3 26>>6.04 (2/20)   5/20 1.35    56  7/20 1.49  0.68 52  12/20 1.09 10.5 0.006 11  3/21   0.02<<0.01   5/21   2.07 19    DATE TEST EF   5/14 Echo  20-25%   8/16 Echo  25 % (44/2.1/35)m LAE  8/18 Echo  20-25%   8/18 Cath  T Cx///90% LAD>>DES  11/18 Echo   25-30%   6/21 PYP  Ratio 1 equivocal      ; Now on Clopidigrel and apixoban     Thromboembolic risk factors ( age -72, HTN-1, Vasc disease -1, CHF-1, Gender-1) for a CHADSVASc Score of >=5   Past Medical History:  Diagnosis Date  . Anginal pain (HCC)   . Automatic implantable cardioverter-defibrillator in situ    a. s/p MDT ICD 07/2012; b. SN # [id number]   . CAD (coronary artery disease)    a. cath 2003: LM nl, mid LAD 50%, LCx nl, RCA nl b. L&RHC 08/17/2014 100% occluded mid LCx, 70% mid LAD with FFR 0.82. Medical therapy. CI 2.1. CO 4.01c. LHC 8/18: CTO LCx w/ R-L colatts, LAD s/p PCI/DES x 2  . Chronic systolic CHF (congestive heart failure) (HCC)    a. echo 2014: EF 25%, diffuse  HK, LA moderately dilated, RA mildly dilated, PASP 38 mm Hg; b. echo 08/2014: EF 25-30%, diffuse HK, RWMA cannot be excluded, mild MR, mild biatrial enlargement, RV mildly dilated, wall thickness nl, pacer wire/catheter noted, mild to mod TR, PASP high nl, c. TTE 8/18: EF 25-30%, diffuse HK, GR1DD, mod MR, mod dilated LA, nl RV sys fxn, PASP 56  . Encounter for long-term (current) use of anticoagulants   . Exertional shortness of breath   . H/O medication noncompliance   . High cholesterol   . Hypertension   . NICM (nonischemic cardiomyopathy) (HCC)    a. s/p AICD 07/22/2012  . Obesity   . OSA on CPAP    "suppose to; not often" (07/22/2012)  . Other "heavy-for-dates" infants   . PAF (paroxysmal atrial fibrillation) (HCC)    a. on warfarin--> Eliquis 08/2014    Past Surgical History:  Procedure Laterality Date  . ABDOMINAL HYSTERECTOMY  ~ 1998   "laparoscopic" (07/22/2012)  . CARDIAC CATHETERIZATION  ? 1990's  . CARDIAC CATHETERIZATION N/A 08/17/2014   Procedure: Right/Left Heart Cath and Coronary Angiography;  Surgeon: Kathleene Hazel, MD;  Location: [facility] INVASIVE CV LAB;  Service:  Cardiovascular;  Laterality: N/A;  . CARDIAC DEFIBRILLATOR PLACEMENT  07/22/2012   dual-chamber ICD.  Marland Kitchen CARDIOVERSION N/A 01/13/2019   Procedure: CARDIOVERSION;  Surgeon: Minna Merritts, MD;  Location: [facility] ORS;  Service: Cardiovascular;  Laterality: N/A;  . CORONARY STENT INTERVENTION N/A 08/24/2016   Procedure: CORONARY STENT INTERVENTION;  Surgeon: Wellington Hampshire, MD;  Location: [place] CV LAB;  Service: Cardiovascular;  Laterality: N/A;  . IMPLANTABLE CARDIOVERTER DEFIBRILLATOR IMPLANT N/A 07/22/2012   Procedure: IMPLANTABLE CARDIOVERTER DEFIBRILLATOR IMPLANT;  Surgeon: Evans Lance, MD;  Location: [facility] CATH LAB;  Service: Cardiovascular;  Laterality: N/A;  . LEFT HEART CATH AND CORONARY ANGIOGRAPHY N/A 08/24/2016   Procedure: LEFT HEART CATH AND CORONARY ANGIOGRAPHY;  Surgeon: Wellington Hampshire,  MD;  Location: [place] CV LAB;  Service: Cardiovascular;  Laterality: N/A;  . TEE WITHOUT CARDIOVERSION N/A 08/21/2014   Procedure: TRANSESOPHAGEAL ECHOCARDIOGRAM (TEE);  Surgeon: Skeet Latch, MD;  Location: [facility] ENDOSCOPY;  Service: Cardiovascular;  Laterality: N/A;  . TUBAL LIGATION  ~ 1978    Current Outpatient Medications  Medication Sig Dispense Refill  . atorvastatin (LIPITOR) 40 MG tablet TAKE 1 TABLET BY MOUTH ONCE DAILY AT  6PM 90 tablet 3  . clopidogrel (PLAVIX) 75 MG tablet Take 1 tablet by mouth once daily with breakfast 30 tablet 5  . ELIQUIS 5 MG TABS tablet Take 1 tablet by mouth twice daily (Patient taking differently: Take 5 mg by mouth 2 (two) times daily. ) 60 tablet 5  . ezetimibe (ZETIA) 10 MG tablet Take 1 tablet (10 mg total) by mouth daily. 90 tablet 3  . losartan (COZAAR) 50 MG tablet Take 1 tablet (50 mg total) by mouth daily. 30 tablet 6  . methimazole (TAPAZOLE) 10 MG tablet Take 4 tablets (40 mg total) by mouth 2 (two) times daily. 240 tablet 5  . mexiletine (MEXITIL) 200 MG capsule Take 1 capsule (200 mg total) by mouth 2 (two) times daily. 60 capsule 6  . propranolol ER (INDERAL LA) 120 MG 24 hr capsule Take 1 capsule (120 mg total) by mouth daily. 30 capsule 6  . spironolactone (ALDACTONE) 25 MG tablet Take 1/2 (one-half) tablet by mouth once daily 45 tablet 3  . torsemide (DEMADEX) 20 MG tablet Take 2 tablets (40 mg total) by mouth daily.     No current facility-administered medications for this visit.    No Known Allergies  Review of Systems negative except from HPI and PMH  Physical Exam   BP 130/70 (BP Location: Right Arm, Patient Position: Sitting, Cuff Size: Normal)   Pulse 72   Ht 5' (1.524 m)   Wt 202 lb 8 oz (91.9 kg)   SpO2 99%   BMI 39.55 kg/m  Well developed and well nourished in no acute distress HENT normal Neck supple with JVP-flat Clear Device pocket well healed; without hematoma or erythema.  There is no tethering    Regular rate and rhythm, no  murmur Abd-soft with active BS No Clubbing cyanosis   edema Skin-warm and dry A & Oriented  Grossly normal sensory and motor function  ECG sinus @ 72 19/09/42   Assessment and  Plan Atrial fibrillation-persistent  Nonischemic cardiomyopathy  Coronary artery disease-2 vessel with recent stenting  Ventricular tachycardia-treated via device  Implantable defibrillator-[employer]onic  Treated hyperthyroidism/hypothryoidism  Congestive heart failure-chronic sys  Hypertransaminasemia normalized off amiodarone  PVCs  Low voltage ECG   VT detection set @ 150   No interval treated VT; some VT NS  Euvolemic  continue current meds  On Anticoagulation;  No bleeding issues   Without symptoms of ischemia  LDL above target will discuss with Dr Knute Neu re changing statin  Finally euthyroid

## 2019-07-04 ENCOUNTER — Other Ambulatory Visit: Payer: Self-pay | Admitting: Cardiovascular Disease

## 2019-07-05 NOTE — Telephone Encounter (Signed)
Pt's age 74, wt 91.9 kg, SCr 1.09, CrCl 65.69, last ov w/ SK 06/26/19 (pt has orders for labs on that date, but they have not been done yet).

## 2019-07-05 NOTE — Telephone Encounter (Signed)
Refill request

## 2019-07-11 ENCOUNTER — Other Ambulatory Visit: Payer: Self-pay | Admitting: Internal Medicine

## 2019-07-11 ENCOUNTER — Ambulatory Visit: Payer: Medicare Other | Admitting: Endocrinology

## 2019-07-28 ENCOUNTER — Encounter: Payer: Self-pay | Admitting: Endocrinology

## 2019-07-28 ENCOUNTER — Ambulatory Visit (INDEPENDENT_AMBULATORY_CARE_PROVIDER_SITE_OTHER): Payer: Medicare Other | Admitting: Endocrinology

## 2019-07-28 ENCOUNTER — Other Ambulatory Visit: Payer: Self-pay

## 2019-07-28 VITALS — BP 122/68 | HR 87 | Ht 64.0 in | Wt 203.4 lb

## 2019-07-28 DIAGNOSIS — E0590 Thyrotoxicosis, unspecified without thyrotoxic crisis or storm: Secondary | ICD-10-CM | POA: Diagnosis not present

## 2019-07-28 DIAGNOSIS — T462X1A Poisoning by other antidysrhythmic drugs, accidental (unintentional), initial encounter: Secondary | ICD-10-CM | POA: Diagnosis not present

## 2019-07-28 DIAGNOSIS — E032 Hypothyroidism due to medicaments and other exogenous substances: Secondary | ICD-10-CM | POA: Diagnosis not present

## 2019-07-28 LAB — CBC WITH DIFFERENTIAL/PLATELET
Basophils Absolute: 0.1 10*3/uL (ref 0.0–0.1)
Basophils Relative: 0.7 % (ref 0.0–3.0)
Eosinophils Absolute: 0.4 10*3/uL (ref 0.0–0.7)
Eosinophils Relative: 5.1 % — ABNORMAL HIGH (ref 0.0–5.0)
HCT: 38.2 % (ref 36.0–46.0)
Hemoglobin: 12.6 g/dL (ref 12.0–15.0)
Lymphocytes Relative: 34.9 % (ref 12.0–46.0)
Lymphs Abs: 2.5 10*3/uL (ref 0.7–4.0)
MCHC: 33 g/dL (ref 30.0–36.0)
MCV: 80.9 fl (ref 78.0–100.0)
Monocytes Absolute: 0.9 10*3/uL (ref 0.1–1.0)
Monocytes Relative: 12.8 % — ABNORMAL HIGH (ref 3.0–12.0)
Neutro Abs: 3.4 10*3/uL (ref 1.4–7.7)
Neutrophils Relative %: 46.5 % (ref 43.0–77.0)
Platelets: 310 10*3/uL (ref 150.0–400.0)
RBC: 4.73 Mil/uL (ref 3.87–5.11)
RDW: 16.2 % — ABNORMAL HIGH (ref 11.5–15.5)
WBC: 7.3 10*3/uL (ref 4.0–10.5)

## 2019-07-28 LAB — BASIC METABOLIC PANEL
BUN: 24 mg/dL — ABNORMAL HIGH (ref 6–23)
CO2: 29 mEq/L (ref 19–32)
Calcium: 9.7 mg/dL (ref 8.4–10.5)
Chloride: 98 mEq/L (ref 96–112)
Creatinine, Ser: 1.52 mg/dL — ABNORMAL HIGH (ref 0.40–1.20)
GFR: 40.42 mL/min — ABNORMAL LOW (ref >60.00)
Glucose, Bld: 92 mg/dL (ref 70–99)
Potassium: 4.3 mEq/L (ref 3.5–5.1)
Sodium: 135 mEq/L (ref 135–145)

## 2019-07-28 LAB — TSH: TSH: 31.66 u[IU]/mL — ABNORMAL HIGH (ref 0.35–4.50)

## 2019-07-28 LAB — T4, FREE: Free T4: 0.62 ng/dL (ref 0.60–1.60)

## 2019-07-28 MED ORDER — METHIMAZOLE 10 MG PO TABS: 10.0000 mg | ORAL_TABLET | Freq: Two times a day (BID) | ORAL | 5 refills | Status: DC

## 2019-07-28 NOTE — Patient Instructions (Addendum)
Blood tests are requested for you today.  We'll let you know about the results.  If ever you have fever while taking methimazole, stop it and call us, even if the reason is obvious, because of the risk of a rare side-effect. It is best to never miss the medication.  However, if you do miss it, next best is to double up the next time.   Please come back for a follow-up appointment in 2 months.  

## 2019-07-28 NOTE — Progress Notes (Signed)
Subjective:    Patient ID: Erin [name], female    DOB: 19-Feb-1945, 74 y.o.   MRN: [id number]  HPI Pt returns for f/u of abnormal thyroid function (dx'ed whlie on amiodarone, which she took 2014-2020; in 2015, she had slightly elevated TSH; several measurements more recently were low; she has never had thyroid imaging; tapazole was resumed in 2018, but then stopped in late 2019, due to hypothyroidism; synthroid was started in early 2020; synthroid was stopped 12/20 due to low TSH; amiodarone was stopped in 2020; tapazole was started in preparation for DC cardioversion, which she had on 1/21.  pt states she feels well in general.  Her AICD has not recently fired.  Pt says she seldom misses the tapazole.  Dr Graciela Husbands has requested labs.   Past Medical History:  Diagnosis Date  . Anginal pain (HCC)   . Automatic implantable cardioverter-defibrillator in situ    a. s/p MDT ICD 07/2012; b. SN # [id number]   . CAD (coronary artery disease)    a. cath 2003: LM nl, mid LAD 50%, LCx nl, RCA nl b. L&RHC 08/17/2014 100% occluded mid LCx, 70% mid LAD with FFR 0.82. Medical therapy. CI 2.1. CO 4.01c. LHC 8/18: CTO LCx w/ R-L colatts, LAD s/p PCI/DES x 2  . Chronic systolic CHF (congestive heart failure) (HCC)    a. echo 2014: EF 25%, diffuse HK, LA moderately dilated, RA mildly dilated, PASP 38 mm Hg; b. echo 08/2014: EF 25-30%, diffuse HK, RWMA cannot be excluded, mild MR, mild biatrial enlargement, RV mildly dilated, wall thickness nl, pacer wire/catheter noted, mild to mod TR, PASP high nl, c. TTE 8/18: EF 25-30%, diffuse HK, GR1DD, mod MR, mod dilated LA, nl RV sys fxn, PASP 56  . Encounter for long-term (current) use of anticoagulants   . Exertional shortness of breath   . H/O medication noncompliance   . High cholesterol   . Hypertension   . NICM (nonischemic cardiomyopathy) (HCC)    a. s/p AICD 07/22/2012  . Obesity   . OSA on CPAP    "suppose to; not often" (07/22/2012)  . Other "heavy-for-dates"  infants   . PAF (paroxysmal atrial fibrillation) (HCC)    a. on warfarin--> Eliquis 08/2014    Past Surgical History:  Procedure Laterality Date  . ABDOMINAL HYSTERECTOMY  ~ 1998   "laparoscopic" (07/22/2012)  . CARDIAC CATHETERIZATION  ? 1990's  . CARDIAC CATHETERIZATION N/A 08/17/2014   Procedure: Right/Left Heart Cath and Coronary Angiography;  Surgeon: Kathleene Hazel, MD;  Location: [facility] INVASIVE CV LAB;  Service: Cardiovascular;  Laterality: N/A;  . CARDIAC DEFIBRILLATOR PLACEMENT  07/22/2012   dual-chamber ICD.  Marland Kitchen CARDIOVERSION N/A 01/13/2019   Procedure: CARDIOVERSION;  Surgeon: Antonieta Iba, MD;  Location: [facility] ORS;  Service: Cardiovascular;  Laterality: N/A;  . CORONARY STENT INTERVENTION N/A 08/24/2016   Procedure: CORONARY STENT INTERVENTION;  Surgeon: Iran Ouch, MD;  Location: [facility] INVASIVE CV LAB;  Service: Cardiovascular;  Laterality: N/A;  . IMPLANTABLE CARDIOVERTER DEFIBRILLATOR IMPLANT N/A 07/22/2012   Procedure: IMPLANTABLE CARDIOVERTER DEFIBRILLATOR IMPLANT;  Surgeon: Marinus Maw, MD;  Location: [facility] CATH LAB;  Service: Cardiovascular;  Laterality: N/A;  . LEFT HEART CATH AND CORONARY ANGIOGRAPHY N/A 08/24/2016   Procedure: LEFT HEART CATH AND CORONARY ANGIOGRAPHY;  Surgeon: Iran Ouch, MD;  Location: [facility] INVASIVE CV LAB;  Service: Cardiovascular;  Laterality: N/A;  . TEE WITHOUT CARDIOVERSION N/A 08/21/2014   Procedure: TRANSESOPHAGEAL ECHOCARDIOGRAM (TEE);  Surgeon: Chilton Si, MD;  Location: [facility] ENDOSCOPY;  Service: Cardiovascular;  Laterality: N/A;  . TUBAL LIGATION  ~ 1978    Social History   Socioeconomic History  . Marital status: Single    Spouse name: Not on file  . Number of children: 3  . Years of education: Not on file  . Highest education level: Not on file  Occupational History  . Occupation: Retired    Associate Professor: [employer]  Tobacco Use  . Smoking status: Never Smoker  . Smokeless tobacco: Never Used  Vaping Use  .  Vaping Use: Never used  Substance and Sexual Activity  . Alcohol use: No  . Drug use: No  . Sexual activity: Never  Other Topics Concern  . Not on file  Social History Narrative  . Not on file   Social Determinants of Health   Financial Resource Strain:   . Difficulty of Paying Living Expenses:   Food Insecurity:   . Worried About Programme researcher, broadcasting/film/video in the Last Year:   . Barista in the Last Year:   Transportation Needs:   . Freight forwarder (Medical):   Marland Kitchen Lack of Transportation (Non-Medical):   Physical Activity:   . Days of Exercise per Week:   . Minutes of Exercise per Session:   Stress:   . Feeling of Stress :   Social Connections:   . Frequency of Communication with Friends and Family:   . Frequency of Social Gatherings with Friends and Family:   . Attends Religious Services:   . Active Member of Clubs or Organizations:   . Attends Banker Meetings:   Marland Kitchen Marital Status:   Intimate Partner Violence:   . Fear of Current or Ex-Partner:   . Emotionally Abused:   Marland Kitchen Physically Abused:   . Sexually Abused:     Current Outpatient Medications on File Prior to Visit  Medication Sig Dispense Refill  . apixaban (ELIQUIS) 5 MG TABS tablet Take 1 tablet (5 mg total) by mouth 2 (two) times daily. 60 tablet 6  . atorvastatin (LIPITOR) 40 MG tablet TAKE 1 TABLET BY MOUTH ONCE DAILY AT  6PM 90 tablet 3  . clopidogrel (PLAVIX) 75 MG tablet Take 1 tablet by mouth once daily with breakfast 30 tablet 5  . ezetimibe (ZETIA) 10 MG tablet Take 1 tablet (10 mg total) by mouth daily. 90 tablet 3  . losartan (COZAAR) 50 MG tablet Take 1 tablet by mouth once daily 30 tablet 11  . mexiletine (MEXITIL) 200 MG capsule Take 1 capsule (200 mg total) by mouth 2 (two) times daily. 60 capsule 6  . propranolol ER (INDERAL LA) 120 MG 24 hr capsule Take 1 capsule (120 mg total) by mouth daily. 30 capsule 6  . spironolactone (ALDACTONE) 25 MG tablet Take 1/2 (one-half) tablet by  mouth once daily 45 tablet 3  . torsemide (DEMADEX) 20 MG tablet Take 2 tablets (40 mg total) by mouth daily.     No current facility-administered medications on file prior to visit.    No Known Allergies  Family History  Problem Relation Age of Onset  . Heart disease Mother   . Heart disease Father   . Lung disease Sister   . Diabetes Sister   . Heart failure Brother   . Thyroid disease Neg Hx     BP 122/68   Pulse 87   Ht 5\' 4"  (1.626 m)   Wt (!) 203 lb 6.4 oz (92.3 kg)   SpO2  97%   BMI 34.91 kg/m    Review of Systems Denies fever    Objective:   Physical Exam VITAL SIGNS:  See vs page GENERAL: no distress NECK: slightly enlarged.  I can't tell details.    Lab Results  Component Value Date   TSH 31.66 (H) 07/28/2019       Assessment & Plan:  Hypothyroidism: she needs increased rx.  I have sent a prescription to your pharmacy. Recheck labs 30d

## 2019-07-31 ENCOUNTER — Telehealth: Payer: Self-pay

## 2019-07-31 NOTE — Telephone Encounter (Signed)
Below documentation was closed before edits could be made. Therefore, previous documentation is erroneous. Below is the CORRECT documentation of our conversation.  RESULTS  Results were reviewed by Dr. Everardo All. Called pt to inform about results as well as new orders. Using closed-loop communication, pt verbalized complete acceptance and understanding of all information provided. No further questions nor concerns were voiced at this time. At pt request, a letter has been mailed to pt home address. For future reference, letter can be found in Epic.

## 2019-07-31 NOTE — Telephone Encounter (Signed)
-----   Message from Romero Belling, MD sent at 07/28/2019  8:19 PM EDT ----- please contact patient: Thyroid has gone low.  Please stop taking the methimazole.  Then in 1 week, please resume at 1 pill, twice a day.  Please come back for a follow-up appointment in 6 weeks.  Your kidneys are a little worse.  I have forwarded this result to Dr Graciela Husbands.

## 2019-07-31 NOTE — Telephone Encounter (Signed)
RESULTS  Results were reviewed by Dr. Everardo All. A letter has been mailed to pt home address. For future reference, letter can be found in Epic.  Called pt to inform about results as well as new orders. LVM requesting returned call. Using closed-loop communication, pt verbalized complete acceptance and understanding of all information provided. No further questions nor concerns were voiced at this time.  SECOND ATTEMPT:  FINAL ATTEMPT:  Using closed-loop communication, pt verbalized complete acceptance and understanding of all information provided. No further questions nor concerns were voiced at this time.

## 2019-08-23 ENCOUNTER — Other Ambulatory Visit: Payer: Self-pay

## 2019-08-23 ENCOUNTER — Other Ambulatory Visit: Payer: Self-pay | Admitting: Cardiovascular Disease

## 2019-08-23 MED ORDER — TORSEMIDE 20 MG PO TABS: 40.0000 mg | ORAL_TABLET | Freq: Every day | ORAL | 0 refills | Status: DC

## 2019-09-19 ENCOUNTER — Ambulatory Visit (INDEPENDENT_AMBULATORY_CARE_PROVIDER_SITE_OTHER): Payer: Medicare Other | Admitting: *Deleted

## 2019-09-19 DIAGNOSIS — I428 Other cardiomyopathies: Secondary | ICD-10-CM | POA: Diagnosis not present

## 2019-09-20 LAB — CUP PACEART REMOTE DEVICE CHECK
Battery Remaining Longevity: 29 mo
Battery Voltage: 2.94 V
Brady Statistic AP VP Percent: 0 %
Brady Statistic AP VS Percent: 0.24 %
Brady Statistic AS VP Percent: 0.03 %
Brady Statistic AS VS Percent: 99.73 %
Brady Statistic RA Percent Paced: 0.24 %
Brady Statistic RV Percent Paced: 0.03 %
Date Time Interrogation Session: 20210915132304
HighPow Impedance: 67 Ohm
Implantable Lead Implant Date: 20140718
Implantable Lead Implant Date: 20140718
Implantable Lead Location: 753859
Implantable Lead Location: 753860
Implantable Lead Model: 5076
Implantable Lead Model: 6935
Implantable Pulse Generator Implant Date: 20140718
Lead Channel Impedance Value: 342 Ohm
Lead Channel Impedance Value: 399 Ohm
Lead Channel Impedance Value: 399 Ohm
Lead Channel Pacing Threshold Amplitude: 0.5 V
Lead Channel Pacing Threshold Amplitude: 0.625 V
Lead Channel Pacing Threshold Pulse Width: 0.4 ms
Lead Channel Pacing Threshold Pulse Width: 0.4 ms
Lead Channel Sensing Intrinsic Amplitude: 11.625 mV
Lead Channel Sensing Intrinsic Amplitude: 11.625 mV
Lead Channel Sensing Intrinsic Amplitude: 3.375 mV
Lead Channel Sensing Intrinsic Amplitude: 3.375 mV
Lead Channel Setting Pacing Amplitude: 2 V
Lead Channel Setting Pacing Amplitude: 2.5 V
Lead Channel Setting Pacing Pulse Width: 0.4 ms
Lead Channel Setting Sensing Sensitivity: 0.3 mV

## 2019-09-21 NOTE — Progress Notes (Signed)
Remote ICD transmission.   

## 2019-09-26 ENCOUNTER — Other Ambulatory Visit: Payer: Self-pay

## 2019-09-26 ENCOUNTER — Ambulatory Visit (INDEPENDENT_AMBULATORY_CARE_PROVIDER_SITE_OTHER): Payer: Medicare Other | Admitting: Endocrinology

## 2019-09-26 ENCOUNTER — Encounter: Payer: Self-pay | Admitting: Endocrinology

## 2019-09-26 ENCOUNTER — Other Ambulatory Visit: Payer: Medicare Other

## 2019-09-26 VITALS — BP 122/70 | HR 78 | Ht 64.0 in | Wt 210.4 lb

## 2019-09-26 DIAGNOSIS — E0590 Thyrotoxicosis, unspecified without thyrotoxic crisis or storm: Secondary | ICD-10-CM | POA: Diagnosis not present

## 2019-09-26 LAB — T4, FREE: Free T4: 0.27 ng/dL — ABNORMAL LOW (ref 0.60–1.60)

## 2019-09-26 LAB — TSH: TSH: 75.28 u[IU]/mL — ABNORMAL HIGH (ref 0.35–4.50)

## 2019-09-26 NOTE — Patient Instructions (Signed)
Blood tests are requested for you today.  We'll let you know about the results.  If ever you have fever while taking methimazole, stop it and call us, even if the reason is obvious, because of the risk of a rare side-effect. It is best to never miss the medication.  However, if you do miss it, next best is to double up the next time.   Please come back for a follow-up appointment in 2 months.  

## 2019-09-26 NOTE — Progress Notes (Signed)
Subjective:    Patient ID: Erin [name], female    DOB: 04-21-1945, 74 y.o.   MRN: [id number]  HPI Pt returns for f/u of abnormal thyroid function (dx'ed whlie on amiodarone, which she took 2014-2020; in 2015, she had slightly elevated TSH; several measurements more recently were low; she has never had thyroid imaging; tapazole was resumed in 2018, but then stopped in late 2019, due to hypothyroidism; synthroid was started in early 2020; synthroid was stopped 12/20 due to low TSH; amiodarone was stopped in 2020; tapazole was started in preparation for DC cardioversion, which she had on 1/21).  pt states she feels well in general.  Her AICD has not recently fired.  Pt says she seldom misses the tapazole.   Past Medical History:  Diagnosis Date  . Anginal pain (HCC)   . Automatic implantable cardioverter-defibrillator in situ    a. s/p MDT ICD 07/2012; b. SN # [id number]   . CAD (coronary artery disease)    a. cath 2003: LM nl, mid LAD 50%, LCx nl, RCA nl b. L&RHC 08/17/2014 100% occluded mid LCx, 70% mid LAD with FFR 0.82. Medical therapy. CI 2.1. CO 4.01c. LHC 8/18: CTO LCx w/ R-L colatts, LAD s/p PCI/DES x 2  . Chronic systolic CHF (congestive heart failure) (HCC)    a. echo 2014: EF 25%, diffuse HK, LA moderately dilated, RA mildly dilated, PASP 38 mm Hg; b. echo 08/2014: EF 25-30%, diffuse HK, RWMA cannot be excluded, mild MR, mild biatrial enlargement, RV mildly dilated, wall thickness nl, pacer wire/catheter noted, mild to mod TR, PASP high nl, c. TTE 8/18: EF 25-30%, diffuse HK, GR1DD, mod MR, mod dilated LA, nl RV sys fxn, PASP 56  . Encounter for long-term (current) use of anticoagulants   . Exertional shortness of breath   . H/O medication noncompliance   . High cholesterol   . Hypertension   . NICM (nonischemic cardiomyopathy) (HCC)    a. s/p AICD 07/22/2012  . Obesity   . OSA on CPAP    "suppose to; not often" (07/22/2012)  . Other "heavy-for-dates" infants   . PAF (paroxysmal  atrial fibrillation) (HCC)    a. on warfarin--> Eliquis 08/2014    Past Surgical History:  Procedure Laterality Date  . ABDOMINAL HYSTERECTOMY  ~ 1998   "laparoscopic" (07/22/2012)  . CARDIAC CATHETERIZATION  ? 1990's  . CARDIAC CATHETERIZATION N/A 08/17/2014   Procedure: Right/Left Heart Cath and Coronary Angiography;  Surgeon: Kathleene Hazel, MD;  Location: [facility] INVASIVE CV LAB;  Service: Cardiovascular;  Laterality: N/A;  . CARDIAC DEFIBRILLATOR PLACEMENT  07/22/2012   dual-chamber ICD.  Marland Kitchen CARDIOVERSION N/A 01/13/2019   Procedure: CARDIOVERSION;  Surgeon: Antonieta Iba, MD;  Location: [facility] ORS;  Service: Cardiovascular;  Laterality: N/A;  . CORONARY STENT INTERVENTION N/A 08/24/2016   Procedure: CORONARY STENT INTERVENTION;  Surgeon: Iran Ouch, MD;  Location: [facility] INVASIVE CV LAB;  Service: Cardiovascular;  Laterality: N/A;  . IMPLANTABLE CARDIOVERTER DEFIBRILLATOR IMPLANT N/A 07/22/2012   Procedure: IMPLANTABLE CARDIOVERTER DEFIBRILLATOR IMPLANT;  Surgeon: Marinus Maw, MD;  Location: [facility] CATH LAB;  Service: Cardiovascular;  Laterality: N/A;  . LEFT HEART CATH AND CORONARY ANGIOGRAPHY N/A 08/24/2016   Procedure: LEFT HEART CATH AND CORONARY ANGIOGRAPHY;  Surgeon: Iran Ouch, MD;  Location: [facility] INVASIVE CV LAB;  Service: Cardiovascular;  Laterality: N/A;  . TEE WITHOUT CARDIOVERSION N/A 08/21/2014   Procedure: TRANSESOPHAGEAL ECHOCARDIOGRAM (TEE);  Surgeon: Chilton Si, MD;  Location: [facility] ENDOSCOPY;  Service:  Cardiovascular;  Laterality: N/A;  . TUBAL LIGATION  ~ 1978    Social History   Socioeconomic History  . Marital status: Single    Spouse name: Not on file  . Number of children: 3  . Years of education: Not on file  . Highest education level: Not on file  Occupational History  . Occupation: Retired    Associate Professor: [employer]  Tobacco Use  . Smoking status: Never Smoker  . Smokeless tobacco: Never Used  Vaping Use  . Vaping Use: Never used    Substance and Sexual Activity  . Alcohol use: No  . Drug use: No  . Sexual activity: Never  Other Topics Concern  . Not on file  Social History Narrative  . Not on file   Social Determinants of Health   Financial Resource Strain:   . Difficulty of Paying Living Expenses: Not on file  Food Insecurity:   . Worried About Programme researcher, broadcasting/film/video in the Last Year: Not on file  . Ran Out of Food in the Last Year: Not on file  Transportation Needs:   . Lack of Transportation (Medical): Not on file  . Lack of Transportation (Non-Medical): Not on file  Physical Activity:   . Days of Exercise per Week: Not on file  . Minutes of Exercise per Session: Not on file  Stress:   . Feeling of Stress : Not on file  Social Connections:   . Frequency of Communication with Friends and Family: Not on file  . Frequency of Social Gatherings with Friends and Family: Not on file  . Attends Religious Services: Not on file  . Active Member of Clubs or Organizations: Not on file  . Attends Banker Meetings: Not on file  . Marital Status: Not on file  Intimate Partner Violence:   . Fear of Current or Ex-Partner: Not on file  . Emotionally Abused: Not on file  . Physically Abused: Not on file  . Sexually Abused: Not on file    Current Outpatient Medications on File Prior to Visit  Medication Sig Dispense Refill  . apixaban (ELIQUIS) 5 MG TABS tablet Take 1 tablet (5 mg total) by mouth 2 (two) times daily. 60 tablet 6  . atorvastatin (LIPITOR) 40 MG tablet TAKE 1 TABLET BY MOUTH ONCE DAILY AT  6PM 90 tablet 3  . clopidogrel (PLAVIX) 75 MG tablet Take 1 tablet by mouth once daily with breakfast 30 tablet 5  . losartan (COZAAR) 50 MG tablet Take 1 tablet by mouth once daily 30 tablet 11  . methimazole (TAPAZOLE) 10 MG tablet Take 1 tablet (10 mg total) by mouth 2 (two) times daily. 60 tablet 5  . mexiletine (MEXITIL) 200 MG capsule Take 1 capsule (200 mg total) by mouth 2 (two) times daily. 60  capsule 6  . propranolol ER (INDERAL LA) 120 MG 24 hr capsule Take 1 capsule (120 mg total) by mouth daily. 30 capsule 6  . spironolactone (ALDACTONE) 25 MG tablet Take 1/2 (one-half) tablet by mouth once daily 45 tablet 3  . torsemide (DEMADEX) 20 MG tablet Take 2 tablets (40 mg total) by mouth daily. 180 tablet 0  . ezetimibe (ZETIA) 10 MG tablet Take 1 tablet (10 mg total) by mouth daily. 90 tablet 3   No current facility-administered medications on file prior to visit.    No Known Allergies  Family History  Problem Relation Age of Onset  . Heart disease Mother   . Heart disease  Father   . Lung disease Sister   . Diabetes Sister   . Heart failure Brother   . Thyroid disease Neg Hx     BP 122/70   Pulse 78   Ht 5\' 4"  (1.626 m)   Wt 210 lb 6.4 oz (95.4 kg)   SpO2 90%   BMI 36.12 kg/m    Review of Systems Denies fever.      Objective:   Physical Exam VITAL SIGNS:  See vs page.   GENERAL: no distress.   NECK: thyroid is slightly and diffusely enlarged.    Lab Results  Component Value Date   TSH 75.28 (H) 09/26/2019      Assessment & Plan:  Hypothyroidism, due to methimazole.  Pt is advised to d/c it Please come back for a follow-up appointment in 1 month.

## 2019-09-27 ENCOUNTER — Telehealth: Payer: Self-pay

## 2019-09-27 NOTE — Telephone Encounter (Signed)
-----   Message from Romero Belling, MD sent at 09/26/2019  4:39 PM EDT ----- please contact patient: Thyroid has gone lower.  D/c methimazole.  Please come back for a follow-up appointment in 1 month.

## 2019-09-27 NOTE — Telephone Encounter (Signed)
Patient informed of results and recommendations.  Appointment made. 

## 2019-10-25 ENCOUNTER — Other Ambulatory Visit: Payer: Self-pay | Admitting: Internal Medicine

## 2019-10-25 ENCOUNTER — Ambulatory Visit: Payer: Medicare Other | Admitting: Endocrinology

## 2019-11-09 ENCOUNTER — Other Ambulatory Visit: Payer: Self-pay

## 2019-11-09 ENCOUNTER — Encounter: Payer: Self-pay | Admitting: Endocrinology

## 2019-11-09 ENCOUNTER — Ambulatory Visit (INDEPENDENT_AMBULATORY_CARE_PROVIDER_SITE_OTHER): Payer: Medicare Other | Admitting: Endocrinology

## 2019-11-09 ENCOUNTER — Other Ambulatory Visit (INDEPENDENT_AMBULATORY_CARE_PROVIDER_SITE_OTHER): Payer: Medicare Other

## 2019-11-09 VITALS — BP 116/68 | HR 72 | Ht 64.0 in | Wt 210.0 lb

## 2019-11-09 DIAGNOSIS — E0590 Thyrotoxicosis, unspecified without thyrotoxic crisis or storm: Secondary | ICD-10-CM

## 2019-11-09 LAB — T4, FREE: Free T4: 0.91 ng/dL (ref 0.60–1.60)

## 2019-11-09 LAB — TSH: TSH: 4.46 u[IU]/mL (ref 0.35–4.50)

## 2019-11-09 MED ORDER — METHIMAZOLE 5 MG PO TABS: 5.0000 mg | ORAL_TABLET | Freq: Every day | ORAL | 1 refills | Status: DC

## 2019-11-09 NOTE — Patient Instructions (Signed)
Blood tests are requested for you today.  We'll let you know about the results.   Please come back for a follow-up appointment in 2-3 months.   

## 2019-11-09 NOTE — Progress Notes (Signed)
Subjective:    Patient ID: Erin [name], female    DOB: 24-Dec-1945, 74 y.o.   MRN: [id number]  HPI Pt returns for f/u of abnormal thyroid function (dx'ed whlie on amiodarone, which she took 2014-2020; in 2015, she had slightly elevated TSH; several measurements more recently were low; she has never had thyroid imaging; tapazole was resumed in 2018, but then stopped in late 2019, due to hypothyroidism; synthroid was started in early 2020; synthroid was stopped 12/20 due to low TSH; amiodarone was stopped in 2020; tapazole was started in preparation for DC cardioversion, which she had on 1/21; tapazole was stopped 9/21, due to hypothyroidism).  pt states since off the tapazole, she feels well in general.   Past Medical History:  Diagnosis Date  . Anginal pain (HCC)   . Automatic implantable cardioverter-defibrillator in situ    a. s/p MDT ICD 07/2012; b. SN # [id number]   . CAD (coronary artery disease)    a. cath 2003: LM nl, mid LAD 50%, LCx nl, RCA nl b. L&RHC 08/17/2014 100% occluded mid LCx, 70% mid LAD with FFR 0.82. Medical therapy. CI 2.1. CO 4.01c. LHC 8/18: CTO LCx w/ R-L colatts, LAD s/p PCI/DES x 2  . Chronic systolic CHF (congestive heart failure) (HCC)    a. echo 2014: EF 25%, diffuse HK, LA moderately dilated, RA mildly dilated, PASP 38 mm Hg; b. echo 08/2014: EF 25-30%, diffuse HK, RWMA cannot be excluded, mild MR, mild biatrial enlargement, RV mildly dilated, wall thickness nl, pacer wire/catheter noted, mild to mod TR, PASP high nl, c. TTE 8/18: EF 25-30%, diffuse HK, GR1DD, mod MR, mod dilated LA, nl RV sys fxn, PASP 56  . Encounter for long-term (current) use of anticoagulants   . Exertional shortness of breath   . H/O medication noncompliance   . High cholesterol   . Hypertension   . NICM (nonischemic cardiomyopathy) (HCC)    a. s/p AICD 07/22/2012  . Obesity   . OSA on CPAP    "suppose to; not often" (07/22/2012)  . Other "heavy-for-dates" infants   . PAF (paroxysmal  atrial fibrillation) (HCC)    a. on warfarin--> Eliquis 08/2014    Past Surgical History:  Procedure Laterality Date  . ABDOMINAL HYSTERECTOMY  ~ 1998   "laparoscopic" (07/22/2012)  . CARDIAC CATHETERIZATION  ? 1990's  . CARDIAC CATHETERIZATION N/A 08/17/2014   Procedure: Right/Left Heart Cath and Coronary Angiography;  Surgeon: Kathleene Hazel, MD;  Location: [facility] INVASIVE CV LAB;  Service: Cardiovascular;  Laterality: N/A;  . CARDIAC DEFIBRILLATOR PLACEMENT  07/22/2012   dual-chamber ICD.  Marland Kitchen CARDIOVERSION N/A 01/13/2019   Procedure: CARDIOVERSION;  Surgeon: Antonieta Iba, MD;  Location: [facility] ORS;  Service: Cardiovascular;  Laterality: N/A;  . CORONARY STENT INTERVENTION N/A 08/24/2016   Procedure: CORONARY STENT INTERVENTION;  Surgeon: Iran Ouch, MD;  Location: [facility] INVASIVE CV LAB;  Service: Cardiovascular;  Laterality: N/A;  . IMPLANTABLE CARDIOVERTER DEFIBRILLATOR IMPLANT N/A 07/22/2012   Procedure: IMPLANTABLE CARDIOVERTER DEFIBRILLATOR IMPLANT;  Surgeon: Marinus Maw, MD;  Location: [facility] CATH LAB;  Service: Cardiovascular;  Laterality: N/A;  . LEFT HEART CATH AND CORONARY ANGIOGRAPHY N/A 08/24/2016   Procedure: LEFT HEART CATH AND CORONARY ANGIOGRAPHY;  Surgeon: Iran Ouch, MD;  Location: [facility] INVASIVE CV LAB;  Service: Cardiovascular;  Laterality: N/A;  . TEE WITHOUT CARDIOVERSION N/A 08/21/2014   Procedure: TRANSESOPHAGEAL ECHOCARDIOGRAM (TEE);  Surgeon: Chilton Si, MD;  Location: [facility] ENDOSCOPY;  Service: Cardiovascular;  Laterality: N/A;  .  TUBAL LIGATION  ~ 1978    Social History   Socioeconomic History  . Marital status: Single    Spouse name: Not on file  . Number of children: 3  . Years of education: Not on file  . Highest education level: Not on file  Occupational History  . Occupation: Retired    Associate Professor: [employer]  Tobacco Use  . Smoking status: Never Smoker  . Smokeless tobacco: Never Used  Vaping Use  . Vaping Use: Never used    Substance and Sexual Activity  . Alcohol use: No  . Drug use: No  . Sexual activity: Never  Other Topics Concern  . Not on file  Social History Narrative  . Not on file   Social Determinants of Health   Financial Resource Strain:   . Difficulty of Paying Living Expenses: Not on file  Food Insecurity:   . Worried About Programme researcher, broadcasting/film/video in the Last Year: Not on file  . Ran Out of Food in the Last Year: Not on file  Transportation Needs:   . Lack of Transportation (Medical): Not on file  . Lack of Transportation (Non-Medical): Not on file  Physical Activity:   . Days of Exercise per Week: Not on file  . Minutes of Exercise per Session: Not on file  Stress:   . Feeling of Stress : Not on file  Social Connections:   . Frequency of Communication with Friends and Family: Not on file  . Frequency of Social Gatherings with Friends and Family: Not on file  . Attends Religious Services: Not on file  . Active Member of Clubs or Organizations: Not on file  . Attends Banker Meetings: Not on file  . Marital Status: Not on file  Intimate Partner Violence:   . Fear of Current or Ex-Partner: Not on file  . Emotionally Abused: Not on file  . Physically Abused: Not on file  . Sexually Abused: Not on file    Current Outpatient Medications on File Prior to Visit  Medication Sig Dispense Refill  . apixaban (ELIQUIS) 5 MG TABS tablet Take 1 tablet (5 mg total) by mouth 2 (two) times daily. 60 tablet 6  . atorvastatin (LIPITOR) 40 MG tablet TAKE 1 TABLET BY MOUTH ONCE DAILY AT  6PM 90 tablet 3  . clopidogrel (PLAVIX) 75 MG tablet Take 1 tablet by mouth once daily with breakfast 30 tablet 5  . losartan (COZAAR) 50 MG tablet Take 1 tablet by mouth once daily 30 tablet 11  . mexiletine (MEXITIL) 200 MG capsule Take 1 capsule (200 mg total) by mouth 2 (two) times daily. 60 capsule 6  . propranolol ER (INDERAL LA) 120 MG 24 hr capsule Take 1 capsule by mouth once daily 90 capsule 0   . spironolactone (ALDACTONE) 25 MG tablet Take 1/2 (one-half) tablet by mouth once daily 45 tablet 3  . torsemide (DEMADEX) 20 MG tablet Take 2 tablets (40 mg total) by mouth daily. 180 tablet 0  . ezetimibe (ZETIA) 10 MG tablet Take 1 tablet (10 mg total) by mouth daily. 90 tablet 3   No current facility-administered medications on file prior to visit.    No Known Allergies  Family History  Problem Relation Age of Onset  . Heart disease Mother   . Heart disease Father   . Lung disease Sister   . Diabetes Sister   . Heart failure Brother   . Thyroid disease Neg Hx     BP  116/68   Pulse 72   Ht 5\' 4"  (1.626 m)   Wt 210 lb (95.3 kg)   SpO2 97%   BMI 36.05 kg/m    Review of Systems     Objective:   Physical Exam VITAL SIGNS:  See vs page GENERAL: no distress NECK: thyroid is slightly and diffusely enlarged.     Lab Results  Component Value Date   TSH 4.46 11/09/2019       Assessment & Plan:  Thyroid is back to normal. However, it will likely go high again if you were to stay off the medication. I have sent a prescription to your pharmacy, to resume the methimazole at 5 mg qd (or you can cut the 10 mg in 1/2). I'll see you next time.

## 2019-11-13 ENCOUNTER — Other Ambulatory Visit: Payer: Self-pay | Admitting: Physician Assistant

## 2019-11-13 ENCOUNTER — Other Ambulatory Visit: Payer: Self-pay | Admitting: Internal Medicine

## 2019-11-21 ENCOUNTER — Other Ambulatory Visit: Payer: Self-pay | Admitting: Cardiovascular Disease

## 2019-11-21 ENCOUNTER — Other Ambulatory Visit: Payer: Self-pay | Admitting: Physician Assistant

## 2019-11-24 ENCOUNTER — Encounter: Payer: Medicare Other | Admitting: Internal Medicine

## 2019-12-04 ENCOUNTER — Ambulatory Visit: Payer: Medicare Other | Admitting: Endocrinology

## 2019-12-06 ENCOUNTER — Other Ambulatory Visit: Payer: Self-pay

## 2019-12-06 ENCOUNTER — Encounter: Payer: Self-pay | Admitting: Student

## 2019-12-06 ENCOUNTER — Ambulatory Visit (INDEPENDENT_AMBULATORY_CARE_PROVIDER_SITE_OTHER): Payer: Medicare Other | Admitting: Student

## 2019-12-06 VITALS — BP 130/72 | HR 82 | Ht 64.0 in | Wt 207.6 lb

## 2019-12-06 DIAGNOSIS — I472 Ventricular tachycardia, unspecified: Secondary | ICD-10-CM

## 2019-12-06 DIAGNOSIS — I5022 Chronic systolic (congestive) heart failure: Secondary | ICD-10-CM | POA: Diagnosis not present

## 2019-12-06 DIAGNOSIS — Z95810 Presence of automatic (implantable) cardiac defibrillator: Secondary | ICD-10-CM

## 2019-12-06 LAB — CUP PACEART INCLINIC DEVICE CHECK
Battery Remaining Longevity: 30 mo
Battery Voltage: 2.92 V
Brady Statistic AP VP Percent: 0 %
Brady Statistic AP VS Percent: 0.2 %
Brady Statistic AS VP Percent: 0.03 %
Brady Statistic AS VS Percent: 99.76 %
Brady Statistic RA Percent Paced: 0.21 %
Brady Statistic RV Percent Paced: 0.03 %
Date Time Interrogation Session: 20211201133248
HighPow Impedance: 70 Ohm
Implantable Lead Implant Date: 20140718
Implantable Lead Implant Date: 20140718
Implantable Lead Location: 753859
Implantable Lead Location: 753860
Implantable Lead Model: 5076
Implantable Lead Model: 6935
Implantable Pulse Generator Implant Date: 20140718
Lead Channel Impedance Value: 323 Ohm
Lead Channel Impedance Value: 399 Ohm
Lead Channel Impedance Value: 399 Ohm
Lead Channel Pacing Threshold Amplitude: 0.5 V
Lead Channel Pacing Threshold Amplitude: 0.5 V
Lead Channel Pacing Threshold Pulse Width: 0.4 ms
Lead Channel Pacing Threshold Pulse Width: 0.4 ms
Lead Channel Sensing Intrinsic Amplitude: 12.375 mV
Lead Channel Sensing Intrinsic Amplitude: 12.625 mV
Lead Channel Sensing Intrinsic Amplitude: 3.375 mV
Lead Channel Sensing Intrinsic Amplitude: 3.5 mV
Lead Channel Setting Pacing Amplitude: 2 V
Lead Channel Setting Pacing Amplitude: 2.5 V
Lead Channel Setting Pacing Pulse Width: 0.4 ms
Lead Channel Setting Sensing Sensitivity: 0.3 mV

## 2019-12-06 LAB — COMPREHENSIVE METABOLIC PANEL
ALT: 15 IU/L (ref 0–32)
AST: 18 IU/L (ref 0–40)
Albumin/Globulin Ratio: 1.6 (ref 1.2–2.2)
Albumin: 4.2 g/dL (ref 3.7–4.7)
Alkaline Phosphatase: 85 IU/L (ref 44–121)
BUN/Creatinine Ratio: 15 (ref 12–28)
BUN: 17 mg/dL (ref 8–27)
Bilirubin Total: 0.7 mg/dL (ref 0.0–1.2)
CO2: 27 mmol/L (ref 20–29)
Calcium: 9.3 mg/dL (ref 8.7–10.3)
Chloride: 100 mmol/L (ref 96–106)
Creatinine, Ser: 1.17 mg/dL — ABNORMAL HIGH (ref 0.57–1.00)
GFR calc Af Amer: 53 mL/min/{1.73_m2} — ABNORMAL LOW (ref >59)
GFR calc non Af Amer: 46 mL/min/{1.73_m2} — ABNORMAL LOW (ref >59)
Globulin, Total: 2.6 g/dL (ref 1.5–4.5)
Glucose: 95 mg/dL (ref 65–99)
Potassium: 3.9 mmol/L (ref 3.5–5.2)
Sodium: 140 mmol/L (ref 134–144)
Total Protein: 6.8 g/dL (ref 6.0–8.5)

## 2019-12-06 LAB — MAGNESIUM: Magnesium: 1.9 mg/dL (ref 1.6–2.3)

## 2019-12-06 MED ORDER — PROPRANOLOL HCL ER 160 MG PO CP24: 160.0000 mg | ORAL_CAPSULE | Freq: Every day | ORAL | 6 refills | Status: DC

## 2019-12-06 NOTE — Patient Instructions (Addendum)
Medication Instructions: Your physician has recommended you make the following change in your medication:  -- INCREASE PROPRANOLOL (Inderal LA) to 160 mg - Take 1 tablet (160 mg) by mouth once daily -- NEW RX SENT  *If you need a refill on your cardiac medications before your next appointment, please call your pharmacy*  Lab Work: Your physician has recommended that you have lab work today: CMET and Magnesium Level  If you have labs (blood work) drawn today and your tests are completely normal, you will receive your results only by: Marland Kitchen MyChart Message (if you have MyChart) OR . A paper copy in the mail If you have any lab test that is abnormal or we need to change your treatment, we will call you to review the results.  Follow-Up: At [facility], you and your health needs are our priority.  As part of our continuing mission to provide you with exceptional heart care, we have created designated Provider Care Teams.  These Care Teams include your primary Cardiologist (physician) and Advanced Practice Providers (APPs -  Physician Assistants and Nurse Practitioners) who all work together to provide you with the care you need, when you need it.  We recommend signing up for the patient portal called "MyChart".  Sign up information is provided on this After Visit Summary.  MyChart is used to connect with patients for Virtual Visits (Telemedicine).  Patients are able to view lab/test results, encounter notes, upcoming appointments, etc.  Non-urgent messages can be sent to your provider as well.   To learn more about what you can do with MyChart, go to [email or web address].    Your next appointment:   Your physician recommends that you schedule a follow-up appointment in: 6 MONTHS with Dr. Graciela Husbands.  Remote monitoring is used to monitor your ICD from home. This monitoring reduces the number of office visits required to check your device to one time per year. It allows [place] to keep an eye on the  functioning of your device to ensure it is working properly. You are scheduled for a device check from home on 12/19/19. You may send your transmission at any time that day. If you have a wireless device, the transmission will be sent automatically. After your physician reviews your transmission, you will receive a postcard with your next transmission date.  The format for your next appointment:   In Person with Sherryl Manges, MD

## 2019-12-06 NOTE — Progress Notes (Signed)
Electrophysiology Office Note Date: 12/06/2019  ID:  Erin [name], DOB [birth year], MRN [id number]  PCP: Patient, No Pcp Per Primary Cardiologist: No primary care provider on file. Electrophysiologist: Virl Axe, MD   CC: Routine ICD follow-up  Erin [name] is a 74 y.o. female seen today for Virl Axe, MD for routine electrophysiology followup.  Since last being seen in our clinic the patient reports doing well.  she denies chest pain, palpitations, dyspnea, PND, orthopnea, nausea, vomiting, dizziness, syncope, edema, weight gain, or early satiety. She has not had ICD shocks. Though she did receive ATP on 11/2 (asymptomatic). She feels like she may have had a non-specific viral syndrome around that time.          Date Cr Hgb TSH ALT  8/18   4.9 16  11/18    2.13   12/19  11.3 26>>6.04 (2/20)   5/20 1.35    56  7/20 1.49  0.68 52  12/20 1.09 10.5 0.006 11  3/21   0.02<<0.01   5/21   2.07 19    DATE TEST EF   5/14 Echo  20-25%   8/16 Echo  25 % (44/2.1/35)m LAE  8/18 Echo  20-25%   8/18 Cath  T Cx///90% LAD>>DES  11/18 Echo   25-30%   6/21 PYP  Ratio 1 equivocal     Device History: Medtronic Dual Chamber ICD implanted 2014 for CHF History of appropriate therapy: Yes History of AAD therapy: Yes; currently on mexitril   Past Medical History:  Diagnosis Date  . Anginal pain ([place])   . Automatic implantable cardioverter-defibrillator in situ    a. s/p MDT ICD 07/2012; b. SN # [id number]   . CAD (coronary artery disease)    a. cath 2003: LM nl, mid LAD 50%, LCx nl, RCA nl b. L&RHC 08/17/2014 100% occluded mid LCx, 70% mid LAD with FFR 0.82. Medical therapy. CI 2.1. CO 4.01c. LHC 8/18: CTO LCx w/ R-L colatts, LAD s/p PCI/DES x 2  . Chronic systolic CHF (congestive heart failure) ([place])    a. echo 2014: EF 25%, diffuse HK, LA moderately dilated, RA mildly dilated, PASP 38 mm Hg; b. echo 08/2014: EF 25-30%, diffuse HK, RWMA cannot be excluded,  mild MR, mild biatrial enlargement, RV mildly dilated, wall thickness nl, pacer wire/catheter noted, mild to mod TR, PASP high nl, c. TTE 8/18: EF 25-30%, diffuse HK, GR1DD, mod MR, mod dilated LA, nl RV sys fxn, PASP 56  . Encounter for long-term (current) use of anticoagulants   . Exertional shortness of breath   . H/O medication noncompliance   . High cholesterol   . Hypertension   . NICM (nonischemic cardiomyopathy) ([place])    a. s/p AICD 07/22/2012  . Obesity   . OSA on CPAP    "suppose to; not often" (07/22/2012)  . Other "heavy-for-dates" infants   . PAF (paroxysmal atrial fibrillation) ([place])    a. on warfarin--> Eliquis 08/2014   Past Surgical History:  Procedure Laterality Date  . ABDOMINAL HYSTERECTOMY  ~ 1998   "laparoscopic" (07/22/2012)  . CARDIAC CATHETERIZATION  ? 1990's  . CARDIAC CATHETERIZATION N/A 08/17/2014   Procedure: Right/Left Heart Cath and Coronary Angiography;  Surgeon: Burnell Blanks, MD;  Location: [place] [facility];  Service: Cardiovascular;  Laterality: N/A;  . CARDIAC DEFIBRILLATOR PLACEMENT  07/22/2012   dual-chamber ICD.  Marland Kitchen CARDIOVERSION N/A 01/13/2019   Procedure: CARDIOVERSION;  Surgeon: Minna Merritts, MD;  Location: [facility]  ORS;  Service: Cardiovascular;  Laterality: N/A;  . CORONARY STENT INTERVENTION N/A 08/24/2016   Procedure: CORONARY STENT INTERVENTION;  Surgeon: Wellington Hampshire, MD;  Location: [place] [facility] LAB;  Service: Cardiovascular;  Laterality: N/A;  . IMPLANTABLE CARDIOVERTER DEFIBRILLATOR IMPLANT N/A 07/22/2012   Procedure: IMPLANTABLE CARDIOVERTER DEFIBRILLATOR IMPLANT;  Surgeon: Evans Lance, MD;  Location: [facility] CATH LAB;  Service: Cardiovascular;  Laterality: N/A;  . LEFT HEART CATH AND CORONARY ANGIOGRAPHY N/A 08/24/2016   Procedure: LEFT HEART CATH AND CORONARY ANGIOGRAPHY;  Surgeon: Wellington Hampshire, MD;  Location: [place] CV LAB;  Service: Cardiovascular;  Laterality: N/A;  . TEE WITHOUT CARDIOVERSION N/A 08/21/2014    Procedure: TRANSESOPHAGEAL ECHOCARDIOGRAM (TEE);  Surgeon: Skeet Latch, MD;  Location: [facility] ENDOSCOPY;  Service: Cardiovascular;  Laterality: N/A;  . TUBAL LIGATION  ~ 1978    Current Outpatient Medications  Medication Sig Dispense Refill  . apixaban (ELIQUIS) 5 MG TABS tablet Take 1 tablet (5 mg total) by mouth 2 (two) times daily. 60 tablet 6  . atorvastatin (LIPITOR) 40 MG tablet TAKE 1 TABLET BY MOUTH ONCE DAILY AT  6PM 90 tablet 3  . clopidogrel (PLAVIX) 75 MG tablet Take 1 tablet by mouth once daily with breakfast 30 tablet 5  . ezetimibe (ZETIA) 10 MG tablet Take 1 tablet (10 mg total) by mouth daily. 90 tablet 3  . losartan (COZAAR) 50 MG tablet Take 1 tablet by mouth once daily 30 tablet 11  . methimazole (TAPAZOLE) 5 MG tablet Take 1 tablet (5 mg total) by mouth daily. 90 tablet 1  . mexiletine (MEXITIL) 200 MG capsule Take 1 capsule by mouth twice daily 60 capsule 0  . spironolactone (ALDACTONE) 25 MG tablet Take 1/2 (one-half) tablet by mouth once daily 45 tablet 3  . torsemide (DEMADEX) 20 MG tablet Take 2 tablets by mouth once daily 180 tablet 0  . propranolol ER (INDERAL LA) 160 MG SR capsule Take 1 capsule (160 mg total) by mouth daily. 30 capsule 6   No current facility-administered medications for this visit.    Allergies:   Patient has no known allergies.   Social History: Social History   Socioeconomic History  . Marital status: Single    Spouse name: Not on file  . Number of children: 3  . Years of education: Not on file  . Highest education level: Not on file  Occupational History  . Occupation: Retired    Fish farm manager: [place]  Tobacco Use  . Smoking status: Never Smoker  . Smokeless tobacco: Never Used  Vaping Use  . Vaping Use: Never used  Substance and Sexual Activity  . Alcohol use: No  . Drug use: No  . Sexual activity: Never  Other Topics Concern  . Not on file  Social History Narrative  . Not on file   Social Determinants of Health    Financial Resource Strain:   . Difficulty of Paying Living Expenses: Not on file  Food Insecurity:   . Worried About Charity fundraiser in the Last Year: Not on file  . Ran Out of Food in the Last Year: Not on file  Transportation Needs:   . Lack of Transportation (Medical): Not on file  . Lack of Transportation (Non-Medical): Not on file  Physical Activity:   . Days of Exercise per Week: Not on file  . Minutes of Exercise per Session: Not on file  Stress:   . Feeling of Stress : Not on file  Social  Connections:   . Frequency of Communication with Friends and Family: Not on file  . Frequency of Social Gatherings with Friends and Family: Not on file  . Attends Religious Services: Not on file  . Active Member of Clubs or Organizations: Not on file  . Attends Archivist Meetings: Not on file  . Marital Status: Not on file  Intimate Partner Violence:   . Fear of Current or Ex-Partner: Not on file  . Emotionally Abused: Not on file  . Physically Abused: Not on file  . Sexually Abused: Not on file    Family History: Family History  Problem Relation Age of Onset  . Heart disease Mother   . Heart disease Father   . Lung disease Sister   . Diabetes Sister   . Heart failure Brother   . Thyroid disease Neg Hx     Review of Systems: All other systems reviewed and are otherwise negative except as noted above.   Physical Exam: Vitals:   12/06/19 1006  BP: 130/72  Pulse: 82  SpO2: 94%  Weight: 207 lb 9.6 oz (94.2 kg)  Height: _0  (1.626 m)     GEN- The patient is well appearing, alert and oriented x 3 today.   HEENT: normocephalic, atraumatic; sclera clear, conjunctiva pink; hearing intact; oropharynx clear; neck supple, no JVP Lymph- no cervical lymphadenopathy Lungs- Clear to ausculation bilaterally, normal work of breathing.  No wheezes, rales, rhonchi Heart- Regular rate and rhythm, no murmurs, rubs or gallops, PMI not laterally displaced GI- soft,  non-tender, non-distended, bowel sounds present, no hepatosplenomegaly Extremities- no clubbing or cyanosis. No edema; DP/PT/radial pulses 2+ bilaterally MS- no significant deformity or atrophy Skin- warm and dry, no rash or lesion; ICD pocket well healed Psych- euthymic mood, full affect Neuro- strength and sensation are intact  ICD interrogation- reviewed in detail today,  See PACEART report  EKG:  EKG is not ordered today.   Recent Labs: 06/22/2019: ALT 19 07/28/2019: BUN 24; Creatinine, Ser 1.52; Hemoglobin 12.6; Platelets 310.0; Potassium 4.3; Sodium 135 11/09/2019: TSH 4.46   Wt Readings from Last 3 Encounters:  12/06/19 207 lb 9.6 oz (94.2 kg)  11/09/19 210 lb (95.3 kg)  09/26/19 210 lb 6.4 oz (95.4 kg)     Other studies Reviewed: Additional studies/ records that were reviewed today include: Previous remotes, EP office ntoes   Assessment and Plan:  1.  Chronic systolic dysfunction s/p Medtronic dual chamber ICD  euvolemic today Stable on an appropriate medical regimen Normal ICD function See Pace Art report No changes today  2. Ventricular tachycardia  VT treated successfuly with ATP 11/2. Asymptomatic, but vaguely remembers a viral syndrome around that time.  Labs today. Increase propranolol to 160 mg daily Re-iterated no driving. She states she stopped driving the first of this year  3. CAD Denies ischemic symptoms.   4. Hypothyroidism Currently euythyroid by most recent labs.   5. Paroxysmal atrial fibrillation On Eliquis. Overall low burden.   Current medicines are reviewed at length with the patient today.   The patient does not have concerns regarding her medicines.  The following changes were made today:  Increase propranolol  Labs/ tests ordered today include:  Orders Placed This Encounter  Procedures  . Comp Met (CMET)  . Magnesium   Disposition:   Follow up with Dr. Caryl Comes  6 months  Signed, Shirley Friar, PA-C  12/06/2019 1:41  PM  [place]  [address] [phone] (office) [phone] (fax)

## 2019-12-19 ENCOUNTER — Ambulatory Visit (INDEPENDENT_AMBULATORY_CARE_PROVIDER_SITE_OTHER): Payer: Medicare Other

## 2019-12-19 DIAGNOSIS — I428 Other cardiomyopathies: Secondary | ICD-10-CM

## 2019-12-21 ENCOUNTER — Other Ambulatory Visit: Payer: Self-pay | Admitting: Internal Medicine

## 2019-12-21 LAB — CUP PACEART REMOTE DEVICE CHECK
Battery Remaining Longevity: 28 mo
Battery Voltage: 2.94 V
Brady Statistic AP VP Percent: 0 %
Brady Statistic AP VS Percent: 0.08 %
Brady Statistic AS VP Percent: 0.03 %
Brady Statistic AS VS Percent: 99.89 %
Brady Statistic RA Percent Paced: 0.08 %
Brady Statistic RV Percent Paced: 0.03 %
Date Time Interrogation Session: 20211216112627
HighPow Impedance: 78 Ohm
Implantable Lead Implant Date: 20140718
Implantable Lead Implant Date: 20140718
Implantable Lead Location: 753859
Implantable Lead Location: 753860
Implantable Lead Model: 5076
Implantable Lead Model: 6935
Implantable Pulse Generator Implant Date: 20140718
Lead Channel Impedance Value: 323 Ohm
Lead Channel Impedance Value: 399 Ohm
Lead Channel Impedance Value: 437 Ohm
Lead Channel Pacing Threshold Amplitude: 0.625 V
Lead Channel Pacing Threshold Amplitude: 0.625 V
Lead Channel Pacing Threshold Pulse Width: 0.4 ms
Lead Channel Pacing Threshold Pulse Width: 0.4 ms
Lead Channel Sensing Intrinsic Amplitude: 12.75 mV
Lead Channel Sensing Intrinsic Amplitude: 12.75 mV
Lead Channel Sensing Intrinsic Amplitude: 3.625 mV
Lead Channel Sensing Intrinsic Amplitude: 3.625 mV
Lead Channel Setting Pacing Amplitude: 2 V
Lead Channel Setting Pacing Amplitude: 2.5 V
Lead Channel Setting Pacing Pulse Width: 0.4 ms
Lead Channel Setting Sensing Sensitivity: 0.3 mV

## 2019-12-21 NOTE — Telephone Encounter (Signed)
This is a [place] pt 

## 2019-12-21 NOTE — Telephone Encounter (Signed)
Rx request sent to pharmacy.  

## 2020-01-03 NOTE — Progress Notes (Signed)
Remote ICD transmission.   

## 2020-01-25 ENCOUNTER — Ambulatory Visit: Payer: Medicare Other | Admitting: Endocrinology

## 2020-01-26 ENCOUNTER — Other Ambulatory Visit: Payer: Self-pay

## 2020-01-26 MED ORDER — MEXILETINE HCL 200 MG PO CAPS: 200.0000 mg | ORAL_CAPSULE | Freq: Two times a day (BID) | ORAL | 1 refills | Status: DC

## 2020-02-05 ENCOUNTER — Telehealth: Payer: Self-pay | Admitting: Cardiovascular Disease

## 2020-02-05 MED ORDER — APIXABAN 5 MG PO TABS: 5.0000 mg | ORAL_TABLET | Freq: Two times a day (BID) | ORAL | 1 refills | Status: DC

## 2020-02-05 MED ORDER — PROPRANOLOL HCL ER 160 MG PO CP24: 160.0000 mg | ORAL_CAPSULE | Freq: Every day | ORAL | 1 refills | Status: DC

## 2020-02-05 MED ORDER — ATORVASTATIN CALCIUM 40 MG PO TABS: 40.0000 mg | ORAL_TABLET | Freq: Every day | ORAL | 1 refills | Status: DC

## 2020-02-05 MED ORDER — EZETIMIBE 10 MG PO TABS
10.0000 mg | ORAL_TABLET | Freq: Every day | ORAL | 1 refills | Status: DC
Start: 2020-02-05 — End: 2020-07-09

## 2020-02-05 MED ORDER — TORSEMIDE 20 MG PO TABS: 40.0000 mg | ORAL_TABLET | Freq: Every day | ORAL | 1 refills | Status: DC

## 2020-02-05 MED ORDER — LOSARTAN POTASSIUM 50 MG PO TABS: 50.0000 mg | ORAL_TABLET | Freq: Every day | ORAL | 1 refills | Status: DC

## 2020-02-05 MED ORDER — CLOPIDOGREL BISULFATE 75 MG PO TABS: 75.0000 mg | ORAL_TABLET | Freq: Every day | ORAL | 1 refills | Status: DC

## 2020-02-05 MED ORDER — SPIRONOLACTONE 25 MG PO TABS: 12.5000 mg | ORAL_TABLET | Freq: Every day | ORAL | 3 refills | Status: DC

## 2020-02-05 NOTE — Telephone Encounter (Signed)
Patient has changed insurances to [employer] and will need all Rx's sent to  [facility] mail order pharmacy fx: [phone] for 90 days, the eliquis is for 30 days

## 2020-02-05 NOTE — Telephone Encounter (Signed)
Rx request sent to [facility]. Sent all heart medications for 90 days with 1 refill for 6 month supply.

## 2020-02-07 ENCOUNTER — Telehealth: Payer: Self-pay | Admitting: Endocrinology

## 2020-02-07 NOTE — Telephone Encounter (Signed)
Patient also needs methimazole (TAPAZOLE) 5 MG sent to her pharmacy for refill - 90 days

## 2020-02-07 NOTE — Telephone Encounter (Signed)
Patient called to advise that she is using the [facility] now.  Their fax # is [phone]   ID number for patient is [phone]

## 2020-02-08 ENCOUNTER — Other Ambulatory Visit: Payer: Self-pay

## 2020-02-08 ENCOUNTER — Other Ambulatory Visit: Payer: Self-pay | Admitting: *Deleted

## 2020-02-08 DIAGNOSIS — E0590 Thyrotoxicosis, unspecified without thyrotoxic crisis or storm: Secondary | ICD-10-CM

## 2020-02-08 MED ORDER — METHIMAZOLE 5 MG PO TABS: 5.0000 mg | ORAL_TABLET | Freq: Every day | ORAL | 1 refills | Status: DC

## 2020-02-08 NOTE — Telephone Encounter (Signed)
Re-sent Rx to [facility] service.

## 2020-02-08 NOTE — Telephone Encounter (Signed)
Pt called to let us know Methimazole prescription was sent to the wrong pharmacy. Please send to [facility] fax # is [phone]

## 2020-02-08 NOTE — Telephone Encounter (Signed)
Rx sent to pharmacy   

## 2020-03-01 ENCOUNTER — Ambulatory Visit: Payer: PRIVATE HEALTH INSURANCE | Admitting: Endocrinology

## 2020-03-04 ENCOUNTER — Telehealth: Payer: Self-pay | Admitting: Cardiovascular Disease

## 2020-03-04 MED ORDER — APIXABAN 5 MG PO TABS: 5.0000 mg | ORAL_TABLET | Freq: Two times a day (BID) | ORAL | 1 refills | Status: DC

## 2020-03-04 NOTE — Telephone Encounter (Signed)
*  STAT* If patient is at the pharmacy, call can be transferred to refill team.   1. Which medications need to be refilled? (please list name of each medication and dose if known) eliquis 5mg  bid  2. Which pharmacy/location (including street and city if local pharmacy) is medication to be sent to? [facility]  3. Do they need a 30 day or 90 day supply? 90

## 2020-03-04 NOTE — Telephone Encounter (Signed)
Pt's age 75, wt 94.2 kg, SCr 1.17, CrCl 62.73, last ov w/ TG 12/06/19. Eliquis refill sent in as requested.

## 2020-03-04 NOTE — Telephone Encounter (Signed)
Refill request

## 2020-03-19 ENCOUNTER — Ambulatory Visit (INDEPENDENT_AMBULATORY_CARE_PROVIDER_SITE_OTHER): Payer: Medicare (Managed Care)

## 2020-03-19 DIAGNOSIS — I428 Other cardiomyopathies: Secondary | ICD-10-CM | POA: Diagnosis not present

## 2020-03-25 LAB — CUP PACEART REMOTE DEVICE CHECK
Battery Remaining Longevity: 26 mo
Battery Voltage: 2.93 V
Brady Statistic AP VP Percent: 0 %
Brady Statistic AP VS Percent: 0.08 %
Brady Statistic AS VP Percent: 0.04 %
Brady Statistic AS VS Percent: 99.88 %
Brady Statistic RA Percent Paced: 0.08 %
Brady Statistic RV Percent Paced: 0.04 %
Date Time Interrogation Session: 20220321090326
HighPow Impedance: 84 Ohm
Implantable Lead Implant Date: 20140718
Implantable Lead Implant Date: 20140718
Implantable Lead Location: 753859
Implantable Lead Location: 753860
Implantable Lead Model: 5076
Implantable Lead Model: 6935
Implantable Pulse Generator Implant Date: 20140718
Lead Channel Impedance Value: 323 Ohm
Lead Channel Impedance Value: 380 Ohm
Lead Channel Impedance Value: 399 Ohm
Lead Channel Pacing Threshold Amplitude: 0.5 V
Lead Channel Pacing Threshold Amplitude: 0.625 V
Lead Channel Pacing Threshold Pulse Width: 0.4 ms
Lead Channel Pacing Threshold Pulse Width: 0.4 ms
Lead Channel Sensing Intrinsic Amplitude: 12.5 mV
Lead Channel Sensing Intrinsic Amplitude: 12.5 mV
Lead Channel Sensing Intrinsic Amplitude: 3.625 mV
Lead Channel Sensing Intrinsic Amplitude: 3.625 mV
Lead Channel Setting Pacing Amplitude: 2 V
Lead Channel Setting Pacing Amplitude: 2.5 V
Lead Channel Setting Pacing Pulse Width: 0.4 ms
Lead Channel Setting Sensing Sensitivity: 0.3 mV

## 2020-03-27 NOTE — Progress Notes (Signed)
Remote ICD transmission.   

## 2020-05-14 ENCOUNTER — Telehealth: Payer: Self-pay | Admitting: Cardiovascular Disease

## 2020-05-14 ENCOUNTER — Other Ambulatory Visit: Payer: Self-pay

## 2020-05-14 MED ORDER — PROPRANOLOL HCL ER 160 MG PO CP24: 160.0000 mg | ORAL_CAPSULE | Freq: Every day | ORAL | 0 refills | Status: DC

## 2020-05-14 MED ORDER — MEXILETINE HCL 200 MG PO CAPS: 200.0000 mg | ORAL_CAPSULE | Freq: Two times a day (BID) | ORAL | 0 refills | Status: DC

## 2020-05-14 MED ORDER — LOSARTAN POTASSIUM 50 MG PO TABS: 50.0000 mg | ORAL_TABLET | Freq: Every day | ORAL | 0 refills | Status: DC

## 2020-05-14 MED ORDER — TORSEMIDE 20 MG PO TABS: 40.0000 mg | ORAL_TABLET | Freq: Every day | ORAL | 0 refills | Status: DC

## 2020-05-14 MED ORDER — SPIRONOLACTONE 25 MG PO TABS: 12.5000 mg | ORAL_TABLET | Freq: Every day | ORAL | 0 refills | Status: DC

## 2020-05-14 NOTE — Telephone Encounter (Signed)
Requested Prescriptions   Signed Prescriptions Disp Refills  . torsemide (DEMADEX) 20 MG tablet 60 tablet 0    Sig: Take 2 tablets (40 mg total) by mouth daily.    Authorizing Provider: Antonieta Iba    Ordering User: Margrett Rud spironolactone (ALDACTONE) 25 MG tablet 15 tablet 0    Sig: Take 0.5 tablets (12.5 mg total) by mouth daily.    Authorizing Provider: Antonieta Iba    Ordering User: Margrett Rud propranolol ER (INDERAL LA) 160 MG SR capsule 30 capsule 0    Sig: Take 1 capsule (160 mg total) by mouth daily.    Authorizing Provider: Antonieta Iba    Ordering User: Margrett Rud mexiletine (MEXITIL) 200 MG capsule 60 capsule 0    Sig: Take 1 capsule (200 mg total) by mouth 2 (two) times daily.    Authorizing Provider: Antonieta Iba    Ordering User: Margrett Rud losartan (COZAAR) 50 MG tablet 30 tablet 0    Sig: Take 1 tablet (50 mg total) by mouth daily.    Authorizing Provider: Antonieta Iba    Ordering User: Margrett Rud

## 2020-05-14 NOTE — Telephone Encounter (Signed)
*  STAT* If patient is at the pharmacy, call can be transferred to refill team.   1. Which medications need to be refilled? (please list name of each medication and dose if known) propranolol, losartan, torsemide, mexiletine, spirolactone   2. Which pharmacy/location (including street and city if local pharmacy) is medication to be sent to? [facility] [address]  3. Do they need a 30 day or 90 day supply? 30

## 2020-05-14 NOTE — Telephone Encounter (Signed)
Requested Prescriptions   Signed Prescriptions Disp Refills  . torsemide (DEMADEX) 20 MG tablet 60 tablet 0    Sig: Take 2 tablets (40 mg total) by mouth daily.    Authorizing Provider: GOLLAN, TIMOTHY J    Ordering User: Blanchard Willhite N  . spironolactone (ALDACTONE) 25 MG tablet 15 tablet 0    Sig: Take 0.5 tablets (12.5 mg total) by mouth daily.    Authorizing Provider: GOLLAN, TIMOTHY J    Ordering User: Ailine Hefferan N  . propranolol ER (INDERAL LA) 160 MG SR capsule 30 capsule 0    Sig: Take 1 capsule (160 mg total) by mouth daily.    Authorizing Provider: GOLLAN, TIMOTHY J    Ordering User: Aeon Koors N  . mexiletine (MEXITIL) 200 MG capsule 60 capsule 0    Sig: Take 1 capsule (200 mg total) by mouth 2 (two) times daily.    Authorizing Provider: GOLLAN, TIMOTHY J    Ordering User: Kiegan Macaraeg N  . losartan (COZAAR) 50 MG tablet 30 tablet 0    Sig: Take 1 tablet (50 mg total) by mouth daily.    Authorizing Provider: GOLLAN, TIMOTHY J    Ordering User: Bliss Behnke N    

## 2020-06-05 ENCOUNTER — Telehealth: Payer: Self-pay | Admitting: Cardiovascular Disease

## 2020-06-05 NOTE — Telephone Encounter (Signed)
Patient states she needs a new prescription for a new CPAP machine. Please call to discuss.

## 2020-06-11 ENCOUNTER — Telehealth: Payer: Self-pay | Admitting: Cardiovascular Disease

## 2020-06-11 ENCOUNTER — Telehealth: Payer: Self-pay | Admitting: Internal Medicine

## 2020-06-11 ENCOUNTER — Other Ambulatory Visit: Payer: Self-pay

## 2020-06-11 MED ORDER — PROPRANOLOL HCL ER 160 MG PO CP24: 160.0000 mg | ORAL_CAPSULE | Freq: Every day | ORAL | 0 refills | Status: DC

## 2020-06-11 MED ORDER — LOSARTAN POTASSIUM 50 MG PO TABS: 50.0000 mg | ORAL_TABLET | Freq: Every day | ORAL | 0 refills | Status: DC

## 2020-06-11 NOTE — Telephone Encounter (Signed)
Requested Prescriptions   Signed Prescriptions Disp Refills   losartan (COZAAR) 50 MG tablet 30 tablet 0    Sig: Take 1 tablet (50 mg total) by mouth daily.    Authorizing Provider: TILLERY, MICHAEL ANDREW    Ordering User: Yitzchak Kothari N   propranolol ER (INDERAL LA) 160 MG SR capsule 30 capsule 0    Sig: Take 1 capsule (160 mg total) by mouth daily.    Authorizing Provider: TILLERY, MICHAEL ANDREW    Ordering User: Nicolena Schurman N   

## 2020-06-11 NOTE — Telephone Encounter (Signed)
Requested Prescriptions   Signed Prescriptions Disp Refills   losartan (COZAAR) 50 MG tablet 30 tablet 0    Sig: Take 1 tablet (50 mg total) by mouth daily.    Authorizing Provider: Graciella Freer    Ordering User: Margrett Rud   propranolol ER (INDERAL LA) 160 MG SR capsule 30 capsule 0    Sig: Take 1 capsule (160 mg total) by mouth daily.    Authorizing Provider: Graciella Freer    Ordering User: Margrett Rud

## 2020-06-11 NOTE — Telephone Encounter (Signed)
Patient last seen 07/2013.  Patient called in requesting Rx fore new cpap, as her current machine is broken. Patient is aware that an appt is needed prior to ordering a new machine. Sleep consult scheduled for 07/26/2020 at 11:00. Nothing further needed at this time.

## 2020-06-11 NOTE — Telephone Encounter (Signed)
Patient calling to check status of cpap advice .

## 2020-06-11 NOTE — Telephone Encounter (Signed)
*  STAT* If patient is at the pharmacy, call can be transferred to refill team.   1. Which medications need to be refilled? (please list name of each medication and dose if known)   Losartan 50 mg po q d  Propanolol ER 160 mg po q d   2. Which pharmacy/location (including street and city if local pharmacy) is medication to be sent to? [facility] [address]   3. Do they need a 30 day or 90 day supply? 30

## 2020-06-11 NOTE — Telephone Encounter (Signed)
Was able to return call to Erin [name], advice our office typically do not order or provide CPAP machines or supplies. Pt has no PCP, was able to give her the number for  [facility] [phone] Assistance with finding a primary care provider within your area  Erin [name] also question pulmonary (next to our office), advised they are currently at lunch, but advised to call back within an hour at [phone] to see if they accept self referrals.  Erin [name] is thankful for the return call and advice. Will call back with anything further.

## 2020-06-18 ENCOUNTER — Ambulatory Visit (INDEPENDENT_AMBULATORY_CARE_PROVIDER_SITE_OTHER): Payer: Medicare Other | Admitting: Internal Medicine

## 2020-06-18 ENCOUNTER — Encounter: Payer: Self-pay | Admitting: Internal Medicine

## 2020-06-18 ENCOUNTER — Other Ambulatory Visit
Admission: RE | Admit: 2020-06-18 | Discharge: 2020-06-18 | Disposition: A | Discharge status: Home / Self Care | Payer: Medicare Other | Attending: Internal Medicine | Admitting: Internal Medicine

## 2020-06-18 ENCOUNTER — Other Ambulatory Visit: Payer: Self-pay

## 2020-06-18 ENCOUNTER — Encounter: Payer: Medicare Other | Admitting: Internal Medicine

## 2020-06-18 VITALS — BP 122/66 | HR 67 | Ht 64.0 in | Wt 208.0 lb

## 2020-06-18 DIAGNOSIS — I480 Paroxysmal atrial fibrillation: Secondary | ICD-10-CM | POA: Insufficient documentation

## 2020-06-18 DIAGNOSIS — I428 Other cardiomyopathies: Secondary | ICD-10-CM

## 2020-06-18 DIAGNOSIS — I5022 Chronic systolic (congestive) heart failure: Secondary | ICD-10-CM | POA: Diagnosis not present

## 2020-06-18 DIAGNOSIS — Z95810 Presence of automatic (implantable) cardiac defibrillator: Secondary | ICD-10-CM

## 2020-06-18 DIAGNOSIS — Z79899 Other long term (current) drug therapy: Secondary | ICD-10-CM

## 2020-06-18 DIAGNOSIS — I472 Ventricular tachycardia, unspecified: Secondary | ICD-10-CM

## 2020-06-18 LAB — CBC WITH DIFFERENTIAL/PLATELET
Abs Immature Granulocytes: 0.04 10*3/uL (ref 0.00–0.07)
Basophils Absolute: 0.1 10*3/uL (ref 0.0–0.1)
Basophils Relative: 1 %
Eosinophils Absolute: 0.4 10*3/uL (ref 0.0–0.5)
Eosinophils Relative: 6 %
HCT: 36.5 % (ref 36.0–46.0)
Hemoglobin: 12.4 g/dL (ref 12.0–15.0)
Immature Granulocytes: 1 %
Lymphocytes Relative: 38 %
Lymphs Abs: 3.2 10*3/uL (ref 0.7–4.0)
MCH: 27.6 pg (ref 26.0–34.0)
MCHC: 34 g/dL (ref 30.0–36.0)
MCV: 81.1 fL (ref 80.0–100.0)
Monocytes Absolute: 1.1 10*3/uL — ABNORMAL HIGH (ref 0.1–1.0)
Monocytes Relative: 14 %
Neutro Abs: 3.1 10*3/uL (ref 1.7–7.7)
Neutrophils Relative %: 40 %
Platelets: 274 10*3/uL (ref 150–400)
RBC: 4.5 MIL/uL (ref 3.87–5.11)
RDW: 14.5 % (ref 11.5–15.5)
WBC: 7.9 10*3/uL (ref 4.0–10.5)
nRBC: 0 % (ref 0.0–0.2)

## 2020-06-18 LAB — PACEMAKER DEVICE OBSERVATION

## 2020-06-18 NOTE — Progress Notes (Signed)
Patient Care Team: Patient, No Pcp Per (Inactive) as PCP - General (General Practice) Duke Salvia, MD as PCP - Electrophysiology (Cardiology) Delma Freeze, FNP as Nurse Practitioner (Cardiology) Duke Salvia, MD as Consulting Physician (Cardiology) Antonieta Iba, MD as Consulting Physician (Cardiology)   HPI  Erin [name] is a 75 y.o. female seen in followup for ICD implanted in the context of a nonischemic and ischemic cardiomyopathy; stenting x2 Now on Clopidigrel and apixoban   She has chronic systolic heart failure related to  atrial fibrillation-persistent. She took amiodarone and apixoban.  Amio was stopped 2/2 amio assoc hyperthyroidism--Rx with methimazole.. Became hypothyroid and was treated with levothyroxine Followed by Dr Everardo All.  No recent follow-up because of changes in insurance and limitation of coverage  The patient denies chest pain,  nocturnal dyspnea, orthopnea or peripheral edema.  There have been no palpitations, lightheadedness or syncope.  Complains of mild chronic dyspnea         Date Cr Hgb TSH ALT  8/18   4.9 16  11/18    2.13   12/19  11.3 26>>6.04 (2/20)   5/20 1.35    56  7/20 1.49  0.68 52  12/20 1.09 10.5 0.006 11  3/21   0.02<<0.01   5/21  12.6 (7/21) 2.07 19  12/21 1.17  4.46<< 75     DATE TEST EF   5/14 Echo  20-25%   8/16 Echo  25 % (44/2.1/35)m LAE  8/18 Echo  20-25%   8/18 Cath  T Cx///90% LAD>>DES  11/18 Echo   25-30%   6/21 PYP  Ratio 1 equivocal         Thromboembolic risk factors ( age -79, HTN-1, Vasc disease -1, CHF-1, Gender-1) for a CHADSVASc Score of >=5   Past Medical History:  Diagnosis Date   Anginal pain (HCC)    Automatic implantable cardioverter-defibrillator in situ    a. s/p MDT ICD 07/2012; b. SN # [id number]    CAD (coronary artery disease)    a. cath 2003: LM nl, mid LAD 50%, LCx nl, RCA nl b. L&RHC 08/17/2014 100% occluded mid LCx, 70% mid LAD with FFR 0.82. Medical therapy. CI 2.1. CO  4.01c. LHC 8/18: CTO LCx w/ R-L colatts, LAD s/p PCI/DES x 2   Chronic systolic CHF (congestive heart failure) (HCC)    a. echo 2014: EF 25%, diffuse HK, LA moderately dilated, RA mildly dilated, PASP 38 mm Hg; b. echo 08/2014: EF 25-30%, diffuse HK, RWMA cannot be excluded, mild MR, mild biatrial enlargement, RV mildly dilated, wall thickness nl, pacer wire/catheter noted, mild to mod TR, PASP high nl, c. TTE 8/18: EF 25-30%, diffuse HK, GR1DD, mod MR, mod dilated LA, nl RV sys fxn, PASP 56   Encounter for long-term (current) use of anticoagulants    Exertional shortness of breath    H/O medication noncompliance    High cholesterol    Hypertension    NICM (nonischemic cardiomyopathy) (HCC)    a. s/p AICD 07/22/2012   Obesity    OSA on CPAP    "suppose to; not often" (07/22/2012)   Other "heavy-for-dates" infants    PAF (paroxysmal atrial fibrillation) (HCC)    a. on warfarin--> Eliquis 08/2014    Past Surgical History:  Procedure Laterality Date   ABDOMINAL HYSTERECTOMY  ~ 1998   "laparoscopic" (07/22/2012)   CARDIAC CATHETERIZATION  ? 1990's   CARDIAC CATHETERIZATION N/A 08/17/2014   Procedure: Right/Left  Heart Cath and Coronary Angiography;  Surgeon: Kathleene Hazel, MD;  Location: [facility] INVASIVE CV LAB;  Service: Cardiovascular;  Laterality: N/A;   CARDIAC DEFIBRILLATOR PLACEMENT  07/22/2012   dual-chamber ICD.   CARDIOVERSION N/A 01/13/2019   Procedure: CARDIOVERSION;  Surgeon: Antonieta Iba, MD;  Location: [facility] ORS;  Service: Cardiovascular;  Laterality: N/A;   CORONARY STENT INTERVENTION N/A 08/24/2016   Procedure: CORONARY STENT INTERVENTION;  Surgeon: Iran Ouch, MD;  Location: [facility] INVASIVE CV LAB;  Service: Cardiovascular;  Laterality: N/A;   IMPLANTABLE CARDIOVERTER DEFIBRILLATOR IMPLANT N/A 07/22/2012   Procedure: IMPLANTABLE CARDIOVERTER DEFIBRILLATOR IMPLANT;  Surgeon: Marinus Maw, MD;  Location: [facility] CATH LAB;  Service: Cardiovascular;  Laterality: N/A;   LEFT  HEART CATH AND CORONARY ANGIOGRAPHY N/A 08/24/2016   Procedure: LEFT HEART CATH AND CORONARY ANGIOGRAPHY;  Surgeon: Iran Ouch, MD;  Location: [facility] INVASIVE CV LAB;  Service: Cardiovascular;  Laterality: N/A;   TEE WITHOUT CARDIOVERSION N/A 08/21/2014   Procedure: TRANSESOPHAGEAL ECHOCARDIOGRAM (TEE);  Surgeon: Chilton Si, MD;  Location: [facility] ENDOSCOPY;  Service: Cardiovascular;  Laterality: N/A;   TUBAL LIGATION  ~ 1978    Current Outpatient Medications  Medication Sig Dispense Refill   apixaban (ELIQUIS) 5 MG TABS tablet Take 1 tablet (5 mg total) by mouth 2 (two) times daily. 180 tablet 1   atorvastatin (LIPITOR) 40 MG tablet Take 1 tablet (40 mg total) by mouth daily. 90 tablet 1   clopidogrel (PLAVIX) 75 MG tablet Take 1 tablet (75 mg total) by mouth daily. 90 tablet 1   ezetimibe (ZETIA) 10 MG tablet Take 1 tablet (10 mg total) by mouth daily. 90 tablet 1   losartan (COZAAR) 50 MG tablet Take 1 tablet (50 mg total) by mouth daily. 30 tablet 0   methimazole (TAPAZOLE) 5 MG tablet Take 1 tablet (5 mg total) by mouth daily. 90 tablet 1   mexiletine (MEXITIL) 200 MG capsule Take 1 capsule (200 mg total) by mouth 2 (two) times daily. 60 capsule 0   propranolol ER (INDERAL LA) 160 MG SR capsule Take 1 capsule (160 mg total) by mouth daily. 30 capsule 0   spironolactone (ALDACTONE) 25 MG tablet Take 0.5 tablets (12.5 mg total) by mouth daily. 15 tablet 0   torsemide (DEMADEX) 20 MG tablet Take 2 tablets (40 mg total) by mouth daily. 60 tablet 0   No current facility-administered medications for this visit.    No Known Allergies  Review of Systems negative except from HPI and PMH  Physical Exam BP 122/66 (BP Location: Left Arm, Patient Position: Sitting, Cuff Size: Normal)   Pulse 67   Ht 5\' 4"  (1.626 m)   Wt 208 lb (94.3 kg)   SpO2 93%   BMI 35.70 kg/m   Well developed and Morbidly obese in no acute distress HENT normal Neck supple with JVP-flat Clear Device pocket well  healed; without hematoma or erythema.  There is no tethering  Regular rate and rhythm, no  gallop No / murmur Abd-soft with active BS No Clubbing cyanosis  edema Skin-warm and dry A & Oriented  Grossly normal sensory and motor function  ECG sinus @ 67 19/09/42    Assessment and  Plan Atrial fibrillation-persistent  Nonischemic cardiomyopathy  Coronary artery disease-2 vessel with recent stenting  Ventricular tachycardia-recurrent treated via device  Implantable defibrillator-[employer]onic  Treated hyperthyroidism/hypothryoidism  Congestive heart failure-chronic sys  Hypertransaminasemia normalized off amiodarone  PVCs  Low voltage ECG  neg PYP   Frequent nonsustained ventricular  tachycardia and treated ventricular tachycardia in the setting of having withdrawal amiodarone because of hyperthyroidism.  Mexiletine by itself seems insufficient.  We will check her thyroid tests today and then I will discuss with Dr. Everardo All as to whether we could resume amiodarone with medical therapy for her thyroid dysfunction or whether we need to do something different.  In that case we would plan sotalol and would readmit her for 48 hours for initiation.  Her volume status is stable.  For her cardiomyopathy we will continue her on losartan.  She is unable to afford Entresto.  Is not appropriate to begin her on an SGLT2 and will plan to do this following decision regarding antiarrhythmic therapy.  Continue her on spironolactone 12.5 and Inderal LA 160 having been chosen because it is a late acting sodium channel blocker as it augmented antiarrhythmic   We will check her TSH.  Her last TSH delta was from 75--for her to stop and recheck.  It is possible that some of her ventricular tachycardia could be related to hyperthyroidism currently inadequately controlled by her low-dose methimazole.

## 2020-06-18 NOTE — Patient Instructions (Addendum)
Medication Instructions:  - Your physician recommends that you continue on your current medications as directed. Please refer to the Current Medication list given to you today.  *If you need a refill on your cardiac medications before your next appointment, please call your pharmacy*   Lab Work: - Your physician recommends that you have lab work today: TSH/ BMET/ Magnesium  If you have labs (blood work) drawn today and your tests are completely normal, you will receive your results only by: MyChart Message (if you have MyChart) OR A paper copy in the mail If you have any lab test that is abnormal or we need to change your treatment, we will call you to review the results.   Testing/Procedures: - none ordered   Follow-Up: At [facility], you and your health needs are our priority.  As part of our continuing mission to provide you with exceptional heart care, we have created designated Provider Care Teams.  These Care Teams include your primary Cardiologist (physician) and Advanced Practice Providers (APPs -  Physician Assistants and Nurse Practitioners) who all work together to provide you with the care you need, when you need it.  We recommend signing up for the patient portal called "MyChart".  Sign up information is provided on this After Visit Summary.  MyChart is used to connect with patients for Virtual Visits (Telemedicine).  Patients are able to view lab/test results, encounter notes, upcoming appointments, etc.  Non-urgent messages can be sent to your provider as well.   To learn more about what you can do with MyChart, go to [email or web address].    Your next appointment:   6 month(s)  The format for your next appointment:   In Person  Provider:   Sherryl Manges, MD   Other Instructions N/a

## 2020-06-19 LAB — BASIC METABOLIC PANEL
BUN/Creatinine Ratio: 15 (ref 12–28)
BUN: 19 mg/dL (ref 8–27)
CO2: 22 mmol/L (ref 20–29)
Calcium: 9.7 mg/dL (ref 8.7–10.3)
Chloride: 100 mmol/L (ref 96–106)
Creatinine, Ser: 1.29 mg/dL — ABNORMAL HIGH (ref 0.57–1.00)
Glucose: 96 mg/dL (ref 65–99)
Potassium: 4.8 mmol/L (ref 3.5–5.2)
Sodium: 141 mmol/L (ref 134–144)
eGFR: 43 mL/min/{1.73_m2} — ABNORMAL LOW (ref >59)

## 2020-06-19 LAB — MAGNESIUM: Magnesium: 2.2 mg/dL (ref 1.6–2.3)

## 2020-06-19 LAB — TSH: TSH: 6.08 u[IU]/mL — ABNORMAL HIGH (ref 0.450–4.500)

## 2020-06-20 ENCOUNTER — Other Ambulatory Visit: Payer: Self-pay | Admitting: Cardiovascular Disease

## 2020-06-20 ENCOUNTER — Telehealth: Payer: Self-pay | Admitting: Emergency Medicine

## 2020-06-20 NOTE — Telephone Encounter (Addendum)
Scheduled remote received on 06/19/20 showing ATP successful therapy with episodes of V-Tach. On 05/11/20. Patient was seen in office for ICD check on 06/18/20. Dr. Graciela Husbands aware of VT episodes per note by device.

## 2020-06-28 ENCOUNTER — Telehealth: Payer: Self-pay | Admitting: Internal Medicine

## 2020-06-28 NOTE — Telephone Encounter (Signed)
I called the patient today to discuss her lab results that Dr. Graciela Husbands had forwarded to me. This was just a CBC report that was normal.  The patient asked if Dr. Graciela Husbands needed to speak with Dr. Everardo All about her TSH. In looking at her lab report, her BMET/ Magnesium/ TSH were not resulted by Dr. Graciela Husbands. I advised the patient these were drawn, but came back on an separate panel. She is aware I will need to review with Dr. Graciela Husbands next week and call her back.  The patient confirms she called and got a follow up appointment with Dr. Everardo All, but they couldn't see her until August.

## 2020-07-04 ENCOUNTER — Other Ambulatory Visit: Payer: Self-pay | Admitting: Cardiovascular Disease

## 2020-07-04 NOTE — Telephone Encounter (Signed)
I called the patient and advised her Dr. Graciela Husbands had signed off on her BMP/ TSH/ Magnesium results. She is aware of these results and that Dr. Graciela Husbands has sent a copy to Dr. Everardo All to review and give recommendations regarding her elevated TSH.  The patient is aware we will make no further changes at this time, but to keep her scheduled follow up with Dr. Everardo All. She is aware they may reach out to her sooner from his office with recommendations.  The patient voices understanding and is agreeable.

## 2020-07-04 NOTE — Telephone Encounter (Signed)
Erin Salvia, MD  07/02/2020  9:54 PM EDT Back to Top     Please Inform Patient   Labs are normal x elevated TSH [name]  good morning  thoughts?   U last saw her 11/21     Thanks

## 2020-07-09 ENCOUNTER — Other Ambulatory Visit: Payer: Self-pay | Admitting: *Deleted

## 2020-07-09 MED ORDER — EZETIMIBE 10 MG PO TABS: 10.0000 mg | ORAL_TABLET | Freq: Every day | ORAL | 0 refills | Status: DC

## 2020-07-09 MED ORDER — ATORVASTATIN CALCIUM 40 MG PO TABS: 40.0000 mg | ORAL_TABLET | Freq: Every day | ORAL | 0 refills | Status: DC

## 2020-07-09 MED ORDER — CLOPIDOGREL BISULFATE 75 MG PO TABS: 75.0000 mg | ORAL_TABLET | Freq: Every day | ORAL | 0 refills | Status: DC

## 2020-07-09 MED ORDER — LOSARTAN POTASSIUM 50 MG PO TABS: 50.0000 mg | ORAL_TABLET | Freq: Every day | ORAL | 0 refills | Status: DC

## 2020-07-09 MED ORDER — TORSEMIDE 20 MG PO TABS: 40.0000 mg | ORAL_TABLET | Freq: Every day | ORAL | 0 refills | Status: DC

## 2020-07-09 MED ORDER — SPIRONOLACTONE 25 MG PO TABS: 12.5000 mg | ORAL_TABLET | Freq: Every day | ORAL | 0 refills | Status: DC

## 2020-07-09 MED ORDER — PROPRANOLOL HCL ER 160 MG PO CP24: 160.0000 mg | ORAL_CAPSULE | Freq: Every day | ORAL | 0 refills | Status: DC

## 2020-07-12 ENCOUNTER — Other Ambulatory Visit: Payer: Self-pay

## 2020-07-12 ENCOUNTER — Ambulatory Visit (INDEPENDENT_AMBULATORY_CARE_PROVIDER_SITE_OTHER): Payer: Medicare Other | Admitting: Endocrinology

## 2020-07-12 DIAGNOSIS — E0590 Thyrotoxicosis, unspecified without thyrotoxic crisis or storm: Secondary | ICD-10-CM

## 2020-07-12 MED ORDER — METHIMAZOLE 5 MG PO TABS: 5.0000 mg | ORAL_TABLET | ORAL | 3 refills | Status: DC

## 2020-07-12 NOTE — Progress Notes (Signed)
Subjective:    Patient ID: Erin [name], female    DOB: 07-14-1945, 75 y.o.   MRN: [id number]  HPI Pt returns for f/u of abnormal thyroid function (dx'ed whlie on amiodarone, which she took 2014-2020; in 2015, she had slightly elevated TSH; several measurements more recently were low; she has never had thyroid imaging; tapazole was resumed in 2018, but then stopped in late 2019, due to hypothyroidism; synthroid was started in early 2020; synthroid was stopped 12/20 due to low TSH; amiodarone was stopped in 2020; tapazole was started in preparation for DC cardioversion, which she had on 1/21; tapazole was stopped 9/21, due to hypothyroidism, and resumed 12/21).  pt states since off the tapazole, she feels well in general.  Past Medical History:  Diagnosis Date   Anginal pain (HCC)    Automatic implantable cardioverter-defibrillator in situ    a. s/p MDT ICD 07/2012; b. SN # [id number]    CAD (coronary artery disease)    a. cath 2003: LM nl, mid LAD 50%, LCx nl, RCA nl b. L&RHC 08/17/2014 100% occluded mid LCx, 70% mid LAD with FFR 0.82. Medical therapy. CI 2.1. CO 4.01c. LHC 8/18: CTO LCx w/ R-L colatts, LAD s/p PCI/DES x 2   Chronic systolic CHF (congestive heart failure) (HCC)    a. echo 2014: EF 25%, diffuse HK, LA moderately dilated, RA mildly dilated, PASP 38 mm Hg; b. echo 08/2014: EF 25-30%, diffuse HK, RWMA cannot be excluded, mild MR, mild biatrial enlargement, RV mildly dilated, wall thickness nl, pacer wire/catheter noted, mild to mod TR, PASP high nl, c. TTE 8/18: EF 25-30%, diffuse HK, GR1DD, mod MR, mod dilated LA, nl RV sys fxn, PASP 56   Encounter for long-term (current) use of anticoagulants    Exertional shortness of breath    H/O medication noncompliance    High cholesterol    Hypertension    NICM (nonischemic cardiomyopathy) (HCC)    a. s/p AICD 07/22/2012   Obesity    OSA on CPAP    "suppose to; not often" (07/22/2012)   Other "heavy-for-dates" infants    PAF  (paroxysmal atrial fibrillation) (HCC)    a. on warfarin--> Eliquis 08/2014    Past Surgical History:  Procedure Laterality Date   ABDOMINAL HYSTERECTOMY  ~ 1998   "laparoscopic" (07/22/2012)   CARDIAC CATHETERIZATION  ? 1990's   CARDIAC CATHETERIZATION N/A 08/17/2014   Procedure: Right/Left Heart Cath and Coronary Angiography;  Surgeon: Kathleene Hazel, MD;  Location: [facility] INVASIVE CV LAB;  Service: Cardiovascular;  Laterality: N/A;   CARDIAC DEFIBRILLATOR PLACEMENT  07/22/2012   dual-chamber ICD.   CARDIOVERSION N/A 01/13/2019   Procedure: CARDIOVERSION;  Surgeon: Antonieta Iba, MD;  Location: [facility] ORS;  Service: Cardiovascular;  Laterality: N/A;   CORONARY STENT INTERVENTION N/A 08/24/2016   Procedure: CORONARY STENT INTERVENTION;  Surgeon: Iran Ouch, MD;  Location: [facility] INVASIVE CV LAB;  Service: Cardiovascular;  Laterality: N/A;   IMPLANTABLE CARDIOVERTER DEFIBRILLATOR IMPLANT N/A 07/22/2012   Procedure: IMPLANTABLE CARDIOVERTER DEFIBRILLATOR IMPLANT;  Surgeon: Marinus Maw, MD;  Location: [facility] CATH LAB;  Service: Cardiovascular;  Laterality: N/A;   LEFT HEART CATH AND CORONARY ANGIOGRAPHY N/A 08/24/2016   Procedure: LEFT HEART CATH AND CORONARY ANGIOGRAPHY;  Surgeon: Iran Ouch, MD;  Location: [facility] INVASIVE CV LAB;  Service: Cardiovascular;  Laterality: N/A;   TEE WITHOUT CARDIOVERSION N/A 08/21/2014   Procedure: TRANSESOPHAGEAL ECHOCARDIOGRAM (TEE);  Surgeon: Chilton Si, MD;  Location: [facility] ENDOSCOPY;  Service: Cardiovascular;  Laterality: N/A;   TUBAL LIGATION  ~ 1978    Social History   Socioeconomic History   Marital status: Single    Spouse name: Not on file   Number of children: 3   Years of education: Not on file   Highest education level: Not on file  Occupational History   Occupation: Retired    Associate Professor: [employer]  Tobacco Use   Smoking status: Never   Smokeless tobacco: Never  Vaping Use   Vaping Use: Never used  Substance and Sexual  Activity   Alcohol use: No   Drug use: No   Sexual activity: Never  Other Topics Concern   Not on file  Social History Narrative   Not on file   Social Determinants of Health   Financial Resource Strain: Not on file  Food Insecurity: Not on file  Transportation Needs: Not on file  Physical Activity: Not on file  Stress: Not on file  Social Connections: Not on file  Intimate Partner Violence: Not on file    Current Outpatient Medications on File Prior to Visit  Medication Sig Dispense Refill   apixaban (ELIQUIS) 5 MG TABS tablet Take 1 tablet (5 mg total) by mouth 2 (two) times daily. 180 tablet 1   atorvastatin (LIPITOR) 40 MG tablet Take 1 tablet (40 mg total) by mouth daily. 90 tablet 0   clopidogrel (PLAVIX) 75 MG tablet Take 1 tablet (75 mg total) by mouth daily. 90 tablet 0   ezetimibe (ZETIA) 10 MG tablet Take 1 tablet (10 mg total) by mouth daily. 90 tablet 0   losartan (COZAAR) 50 MG tablet Take 1 tablet (50 mg total) by mouth daily. 90 tablet 0   mexiletine (MEXITIL) 200 MG capsule Take 1 capsule (200 mg total) by mouth 2 (two) times daily. 60 capsule 0   propranolol ER (INDERAL LA) 160 MG SR capsule Take 1 capsule (160 mg total) by mouth daily. 90 capsule 0   spironolactone (ALDACTONE) 25 MG tablet Take 0.5 tablets (12.5 mg total) by mouth daily. 45 tablet 0   torsemide (DEMADEX) 20 MG tablet Take 2 tablets (40 mg total) by mouth daily. 180 tablet 0   No current facility-administered medications on file prior to visit.    No Known Allergies  Family History  Problem Relation Age of Onset   Heart disease Mother    Heart disease Father    Lung disease Sister    Diabetes Sister    Heart failure Brother    Thyroid disease Neg Hx     BP 120/74 (BP Location: Right Arm, Patient Position: Sitting, Cuff Size: Large)   Pulse 73   Ht 5\' 4"  (1.626 m)   Wt 208 lb 6.4 oz (94.5 kg)   SpO2 99%   BMI 35.77 kg/m    Review of Systems Denies fever    Objective:    Physical Exam NECK: There is no palpable thyroid enlargement.  No thyroid nodule is palpable.  No palpable lymphadenopathy at the anterior neck.   Lab Results  Component Value Date   TSH 6.080 (H) 06/18/2020       Assessment & Plan:  Hyperthyroidism: slightly overcontrolled.    Patient Instructions  Please reduce the methimazole to 5 mg, every Monday, Wednesday, and Friday If Dr Sunday decides to put you back on amiodarone, that is fine with me.  We'll work around that, but I just need to know when you go back on it.  Please come back for  a follow-up appointment in 2 months.

## 2020-07-12 NOTE — Patient Instructions (Addendum)
Please reduce the methimazole to 5 mg, every Monday, Wednesday, and Friday If Dr Graciela Husbands decides to put you back on amiodarone, that is fine with me.  We'll work around that, but I just need to know when you go back on it.  Please come back for a follow-up appointment in 2 months.

## 2020-07-17 ENCOUNTER — Telehealth: Payer: Self-pay

## 2020-07-17 ENCOUNTER — Other Ambulatory Visit: Payer: Self-pay

## 2020-07-17 ENCOUNTER — Encounter: Payer: Self-pay | Admitting: Cardiovascular Disease

## 2020-07-17 ENCOUNTER — Ambulatory Visit (INDEPENDENT_AMBULATORY_CARE_PROVIDER_SITE_OTHER): Payer: Medicare Other | Admitting: Cardiovascular Disease

## 2020-07-17 VITALS — BP 120/70 | HR 69 | Ht 60.0 in | Wt 212.2 lb

## 2020-07-17 DIAGNOSIS — I480 Paroxysmal atrial fibrillation: Secondary | ICD-10-CM

## 2020-07-17 DIAGNOSIS — I472 Ventricular tachycardia, unspecified: Secondary | ICD-10-CM

## 2020-07-17 DIAGNOSIS — I428 Other cardiomyopathies: Secondary | ICD-10-CM

## 2020-07-17 DIAGNOSIS — I272 Pulmonary hypertension, unspecified: Secondary | ICD-10-CM

## 2020-07-17 DIAGNOSIS — Z95810 Presence of automatic (implantable) cardiac defibrillator: Secondary | ICD-10-CM

## 2020-07-17 DIAGNOSIS — I5022 Chronic systolic (congestive) heart failure: Secondary | ICD-10-CM | POA: Diagnosis not present

## 2020-07-17 DIAGNOSIS — I25118 Atherosclerotic heart disease of native coronary artery with other forms of angina pectoris: Secondary | ICD-10-CM

## 2020-07-17 MED ORDER — ENTRESTO 24-26 MG PO TABS: 1.0000 | ORAL_TABLET | Freq: Two times a day (BID) | ORAL | 3 refills | Status: DC

## 2020-07-17 NOTE — Telephone Encounter (Signed)
Medication free samples given at office visit 7/13 PA Applications given for both medications  Eliquis (4 boxes)  Lot: [id number] (1 box)  Exp: 05/2022  Lot: [id number] (3 boxes)  Exp: 05/2022  [name] (1 bottle) (new medication)  Lot: [id number]  Exp: 07/2022  30-day free trail card given

## 2020-07-17 NOTE — Patient Instructions (Addendum)
Please complete the patient assistance applications for eliquis and entresto then bring back application with required documents to office for completion by provider.   Medication Instructions:  Entresto 24/26 mg twice a day No changes unless Entresto approved in which case we would stop the losartan Dr. Graciela Husbands to arrange going back on amiodarone and stopping mexiletine He will call to arrange this  If you need a refill on your cardiac medications before your next appointment, please call your pharmacy.   Lab work: No new labs needed  Testing/Procedures: No new testing needed  Follow-Up: At [facility], you and your health needs are our priority.  As part of our continuing mission to provide you with exceptional heart care, we have created designated Provider Care Teams.  These Care Teams include your primary Cardiologist (physician) and Advanced Practice Providers (APPs -  Physician Assistants and Nurse Practitioners) who all work together to provide you with the care you need, when you need it.  You will need a follow up appointment in 12 months  Providers on your designated Care Team:   Nicolasa Ducking, NP Eula Listen, PA-C Marisue Ivan, PA-C [place], [place]   COVID-19 Vaccine Information can be found at: [email or web address] For questions related to vaccine distribution or appointments, please email [email or web address] or call [phone].

## 2020-07-17 NOTE — Progress Notes (Signed)
Date:  07/17/2020   ID:  Erin [name], DOB 1945-12-08, MRN [id number]  Patient Location:  [phone] [name] [address]   Provider location:   [facility], [place] office  PCP:  Patient, No Pcp Per (Inactive)  Cardiologist:  Hubbard Robinson Heart Chief Complaint  Patient presents with   12 month follow up     "Doing well." Medications reviewed by the patient verbally.     History of Present Illness:    Erin [name] is a 75 y.o. female past medical history of coronary artery disease,  paroxysmal atrial fibrillation,  chronic systolic CHF   Medtronic ICD placed July 2014 lost to follow-up for the past 2 years, Recently ran out of her medications who presented to [facility] 08/20/2016  after episode of weakness, ICD shock while she was at the [employer] Ejection fraction 25 to 30% in November 2018 Right heart pressures of 70 at that time PCI of mid LAD disease, 2 drug-eluting stents. 08/2016 occluded left circumflex with right-to-left collaterals.  She presents for follow-up of her cardiomyopathy, sustained VT   chronic systolic heart failure related to  atrial fibrillation-persistent. Echo and stress test 2018 Cardioversion 01/2019 for atrial fib Seen by dr. Graciela Husbands 06/2020 Downloads reviewed Frequent nonsustained ventricular tachycardia and treated ventricular tachycardia in the setting of having withdrawal amiodarone because of hyperthyroidism  Dr. Everardo All ok to restart amiodarone  unable to afford Entresto. spironolactone 12.5  Inderal LA 160   Amio was stopped 2/2 amio assoc hyperthyroidism--Rx with methimazole.. Became hypothyroid and was treated with levothyroxine   Some discussion of stopping mexiletine and going back on amiodarone given continued nonsustained VT runs  BMP stable On torsemide 40 daily No edema, ABD not distended   EKG personally reviewed by myself on todays visit Shows normal sinus rhythm rate 69 bpm nonspecific T wave  abnormality  Past medical history reviewed August 2018 Medtronic ICD device interrogated showing appropriate shock for VT  numerous episodes of VT with rate 167 up to 200 bpm, no therapy given Device reprogrammed 08/20/2016 Started on amiodarone infusion, Echocardiogram at that time ejection fraction 25% with dilated LV, global hypokinesis Stress test results discussed with her in detail, anterior wall ischemia   cardiac catheterization showed significant two-vessel coronary artery disease with known chronically occluded left circumflex with right-to-left collaterals. There was progression of mid LAD disease.  -- treated successfully with PCI and 2 overlapped drug-eluting stent placement.  First diagonal was jailed by the stent with sluggish flow but no significant ostial stenosis.     Prior CV studies:   The following studies were reviewed today:    Past Medical History:  Diagnosis Date   Anginal pain (HCC)    Automatic implantable cardioverter-defibrillator in situ    a. s/p MDT ICD 07/2012; b. SN # [id number]    CAD (coronary artery disease)    a. cath 2003: LM nl, mid LAD 50%, LCx nl, RCA nl b. L&RHC 08/17/2014 100% occluded mid LCx, 70% mid LAD with FFR 0.82. Medical therapy. CI 2.1. CO 4.01c. LHC 8/18: CTO LCx w/ R-L colatts, LAD s/p PCI/DES x 2   Chronic systolic CHF (congestive heart failure) (HCC)    a. echo 2014: EF 25%, diffuse HK, LA moderately dilated, RA mildly dilated, PASP 38 mm Hg; b. echo 08/2014: EF 25-30%, diffuse HK, RWMA cannot be excluded, mild MR, mild biatrial enlargement, RV mildly dilated, wall thickness nl, pacer wire/catheter noted, mild to mod  TR, PASP high nl, c. TTE 8/18: EF 25-30%, diffuse HK, GR1DD, mod MR, mod dilated LA, nl RV sys fxn, PASP 56   Encounter for long-term (current) use of anticoagulants    Exertional shortness of breath    H/O medication noncompliance    High cholesterol    Hypertension    NICM (nonischemic cardiomyopathy) (HCC)    a.  s/p AICD 07/22/2012   Obesity    OSA on CPAP    "suppose to; not often" (07/22/2012)   Other "heavy-for-dates" infants    PAF (paroxysmal atrial fibrillation) (HCC)    a. on warfarin--> Eliquis 08/2014   Past Surgical History:  Procedure Laterality Date   ABDOMINAL HYSTERECTOMY  ~ 1998   "laparoscopic" (07/22/2012)   CARDIAC CATHETERIZATION  ? 1990's   CARDIAC CATHETERIZATION N/A 08/17/2014   Procedure: Right/Left Heart Cath and Coronary Angiography;  Surgeon: Kathleene Hazel, MD;  Location: [facility] INVASIVE CV LAB;  Service: Cardiovascular;  Laterality: N/A;   CARDIAC DEFIBRILLATOR PLACEMENT  07/22/2012   dual-chamber ICD.   CARDIOVERSION N/A 01/13/2019   Procedure: CARDIOVERSION;  Surgeon: Antonieta Iba, MD;  Location: [facility] ORS;  Service: Cardiovascular;  Laterality: N/A;   CORONARY STENT INTERVENTION N/A 08/24/2016   Procedure: CORONARY STENT INTERVENTION;  Surgeon: Iran Ouch, MD;  Location: [facility] INVASIVE CV LAB;  Service: Cardiovascular;  Laterality: N/A;   IMPLANTABLE CARDIOVERTER DEFIBRILLATOR IMPLANT N/A 07/22/2012   Procedure: IMPLANTABLE CARDIOVERTER DEFIBRILLATOR IMPLANT;  Surgeon: Marinus Maw, MD;  Location: [facility] CATH LAB;  Service: Cardiovascular;  Laterality: N/A;   LEFT HEART CATH AND CORONARY ANGIOGRAPHY N/A 08/24/2016   Procedure: LEFT HEART CATH AND CORONARY ANGIOGRAPHY;  Surgeon: Iran Ouch, MD;  Location: [facility] INVASIVE CV LAB;  Service: Cardiovascular;  Laterality: N/A;   TEE WITHOUT CARDIOVERSION N/A 08/21/2014   Procedure: TRANSESOPHAGEAL ECHOCARDIOGRAM (TEE);  Surgeon: Chilton Si, MD;  Location: [facility] ENDOSCOPY;  Service: Cardiovascular;  Laterality: N/A;   TUBAL LIGATION  ~ 1978     Current Meds  Medication Sig   apixaban (ELIQUIS) 5 MG TABS tablet Take 1 tablet (5 mg total) by mouth 2 (two) times daily.   atorvastatin (LIPITOR) 40 MG tablet Take 1 tablet (40 mg total) by mouth daily.   clopidogrel (PLAVIX) 75 MG tablet Take 1 tablet (75 mg total)  by mouth daily.   ezetimibe (ZETIA) 10 MG tablet Take 1 tablet (10 mg total) by mouth daily.   losartan (COZAAR) 50 MG tablet Take 1 tablet (50 mg total) by mouth daily.   methimazole (TAPAZOLE) 5 MG tablet Take 1 tablet (5 mg total) by mouth 3 (three) times a week.   mexiletine (MEXITIL) 200 MG capsule Take 1 capsule (200 mg total) by mouth 2 (two) times daily.   propranolol ER (INDERAL LA) 160 MG SR capsule Take 1 capsule (160 mg total) by mouth daily.   spironolactone (ALDACTONE) 25 MG tablet Take 0.5 tablets (12.5 mg total) by mouth daily.   torsemide (DEMADEX) 20 MG tablet Take 2 tablets (40 mg total) by mouth daily.     Allergies:   Patient has no known allergies.   Social History   Tobacco Use   Smoking status: Never   Smokeless tobacco: Never  Vaping Use   Vaping Use: Never used  Substance Use Topics   Alcohol use: No   Drug use: No     Family Hx: The patient's family history includes Diabetes in her sister; Heart disease in her father and mother; Heart failure in  her brother; Lung disease in her sister. There is no history of Thyroid disease.  ROS:   Please see the history of present illness.    Review of Systems  Constitutional: Negative.   HENT: Negative.    Respiratory: Negative.    Cardiovascular: Negative.   Gastrointestinal: Negative.   Musculoskeletal: Negative.   Neurological: Negative.   Psychiatric/Behavioral: Negative.    All other systems reviewed and are negative.   Labs/Other Tests and Data Reviewed:    Recent Labs: 12/06/2019: ALT 15 06/18/2020: BUN 19; Creatinine, Ser 1.29; Hemoglobin 12.4; Magnesium 2.2; Platelets 274; Potassium 4.8; Sodium 141; TSH 6.080   Recent Lipid Panel Lab Results  Component Value Date/Time   CHOL 158 06/22/2019 09:08 AM   CHOL 154 05/02/2019 12:18 PM   TRIG 90 06/22/2019 09:08 AM   HDL 57 06/22/2019 09:08 AM   HDL 47 05/02/2019 12:18 PM   CHOLHDL 2.8 06/22/2019 09:08 AM   LDLCALC 83 06/22/2019 09:08 AM   LDLCALC  87 05/02/2019 12:18 PM    Wt Readings from Last 3 Encounters:  07/17/20 212 lb 4 oz (96.3 kg)  07/12/20 208 lb 6.4 oz (94.5 kg)  06/18/20 208 lb (94.3 kg)     Exam:    BP 120/70 (BP Location: Left Arm, Patient Position: Sitting, Cuff Size: Normal)   Pulse 69   Ht 5' (1.524 m)   Wt 212 lb 4 oz (96.3 kg)   SpO2 94%   BMI 41.45 kg/m  Constitutional:  oriented to person, place, and time. No distress.  HENT:  Head: Grossly normal Eyes:  no discharge. No scleral icterus.  Neck: No JVD, no carotid bruits  Cardiovascular: Regular rate and rhythm, no murmurs appreciated Pulmonary/Chest: Clear to auscultation bilaterally, no wheezes or rails Abdominal: Soft.  no distension.  no tenderness.  Musculoskeletal: Normal range of motion Neurological:  normal muscle tone. Coordination normal. No atrophy Skin: Skin warm and dry Psychiatric: normal affect, pleasant   ASSESSMENT & PLAN:    Atherosclerosis of native coronary artery of native heart with stable angina pectoris (HCC) - Cholesterol slightly above goal, continue current medications, no further testing, weight loss recommended  Chronic systolic CHF (congestive heart failure) (HCC)  We will continue torsemide 40 daily Discussed extra torsemide after lunch if any abdominal distention or leg edema PND orthopnea  Essential hypertension We will try to get coverage for Entresto, if covered we will stop the losartan  NICM (nonischemic cardiomyopathy) (HCC) Unable to afford [name] We will try company assistance continue losartan  spironolactone with her torsemide 40 Also on propranolol mexiletine Consideration of holding mexiletine going back on amiodarone per EP  Complex sleep apnea syndrome Weight loss recommended  Pulmonary HTN (HCC) Renal function stable, symptoms stable on torsemide 20 daily  PAF (paroxysmal atrial fibrillation) (HCC) Has been on mexiletine and propranolol Discussions to go back on amiodarone per  EP Cardioversion January 2021 Will try company assistance for her Eliquis  Hyperthyroidism Managed by endocrine  Ventricular tachycardia (HCC) Managed by Dr. Graciela Husbands On mexiletine, propranolol Management as above    Total encounter time more than 25 minutes  Greater than 50% was spent in counseling and coordination of care with the patient    Signed, Julien Nordmann, MD  07/17/2020 8:52 AM    [facility] [address], [place], [place] [address]

## 2020-07-25 ENCOUNTER — Telehealth: Payer: Self-pay

## 2020-07-25 NOTE — Telephone Encounter (Signed)
Called and spoke to patient about current use of Bipap machine pt states machine stopped working. Patient had a clear understanding. Nothing further needed.

## 2020-07-26 ENCOUNTER — Institutional Professional Consult (permissible substitution): Payer: Medicare Other | Admitting: Adult Health

## 2020-07-28 ENCOUNTER — Telehealth: Payer: Self-pay | Admitting: Cardiovascular Disease

## 2020-07-29 NOTE — Telephone Encounter (Signed)
Please advise on whether or not to refill this medication. Per Dr. Windell Hummingbird 07/17/20 office note: Dr. Graciela Husbands to arrange going back on amiodarone and stopping mexiletine He will call to arrange this  Thank you!

## 2020-07-31 ENCOUNTER — Other Ambulatory Visit: Payer: Self-pay

## 2020-07-31 ENCOUNTER — Ambulatory Visit (INDEPENDENT_AMBULATORY_CARE_PROVIDER_SITE_OTHER): Payer: Medicare Other | Admitting: Primary Care

## 2020-07-31 ENCOUNTER — Encounter: Payer: Self-pay | Admitting: Primary Care

## 2020-07-31 VITALS — BP 110/80 | HR 74 | Temp 96.6°F | Ht 60.0 in | Wt 206.2 lb

## 2020-07-31 DIAGNOSIS — G4733 Obstructive sleep apnea (adult) (pediatric): Secondary | ICD-10-CM | POA: Diagnosis not present

## 2020-07-31 DIAGNOSIS — G4731 Primary central sleep apnea: Secondary | ICD-10-CM | POA: Diagnosis not present

## 2020-07-31 MED ORDER — MEXILETINE HCL 200 MG PO CAPS
200.0000 mg | ORAL_CAPSULE | Freq: Two times a day (BID) | ORAL | 0 refills | Status: DC
Start: 2020-07-31 — End: 2020-09-10

## 2020-07-31 NOTE — Patient Instructions (Signed)
Work on weight loss effort Elevate head of bed at night/sleep with head elevated in recliner Do not drive  Orders: BIPAP titration re: severe osa New BIPAP with current pressure setting   Follow-up: 3 months with Dr. Craige Cotta or Belia Heman in [place] (new patient-sleep apnea)    Sleep Apnea Sleep apnea affects breathing during sleep. It causes breathing to stop for 10 seconds or more, or to become shallow. People with sleep apnea usually snoreloudly. It can also increase the risk of: Heart attack. Stroke. Being very overweight (obese). Diabetes. Heart failure. Irregular heartbeat. High blood pressure. The goal of treatment is to help you breathe normally again. What are the causes?  The most common cause of this condition is a collapsed or blocked airway. There are three kinds of sleep apnea: Obstructive sleep apnea. This is caused by a blocked or collapsed airway. Central sleep apnea. This happens when the brain does not send the right signals to the muscles that control breathing. Mixed sleep apnea. This is a combination of obstructive and central sleep apnea. What increases the risk? Being overweight. Smoking. Having a small airway. Being older. Being female. Drinking alcohol. Taking medicines to calm yourself (sedatives or tranquilizers). Having family members with the condition. Having a tongue or tonsils that are larger than normal. What are the signs or symptoms? Trouble staying asleep. Loud snoring. Headaches in the morning. Waking up gasping. Dry mouth or sore throat in the morning. Being sleepy or tired during the day. If you are sleepy or tired during the day, you may also: Not be able to focus your mind (concentrate). Forget things. Get angry a lot and have mood swings. Feel sad (depressed). Have changes in your personality. Have less interest in sex, if you are female. Be unable to have an erection, if you are female. How is this treated?  Sleeping on your  side. Using a medicine to get rid of mucus in your nose (decongestant). Avoiding the use of alcohol, medicines to help you relax, or certain pain medicines (narcotics). Losing weight, if needed. Changing your diet. Quitting smoking. Using a machine to open your airway while you sleep, such as: An oral appliance. This is a mouthpiece that shifts your lower jaw forward. A CPAP device. This device blows air through a mask when you breathe out (exhale). An EPAP device. This has valves that you put in each nostril. A BPAP device. This device blows air through a mask when you breathe in (inhale) and breathe out. Having surgery if other treatments do not work. Follow these instructions at home: Lifestyle Make changes that your doctor recommends. Eat a healthy diet. Lose weight if needed. Avoid alcohol, medicines to help you relax, and some pain medicines. Do not smoke or use any products that contain nicotine or tobacco. If you need help quitting, ask your doctor. General instructions Take over-the-counter and prescription medicines only as told by your doctor. If you were given a machine to use while you sleep, use it only as told by your doctor. If you are having surgery, make sure to tell your doctor you have sleep apnea. You may need to bring your device with you. Keep all follow-up visits. Contact a doctor if: The machine that you were given to use during sleep bothers you or does not seem to be working. You do not get better. You get worse. Get help right away if: Your chest hurts. You have trouble breathing in enough air. You have an uncomfortable feeling in your  back, arms, or stomach. You have trouble talking. One side of your body feels weak. A part of your face is hanging down. These symptoms may be an emergency. Get help right away. Call your local emergency services (911 in the [place] Do not wait to see if the symptoms will go away. Do not drive yourself to the  hospital. Summary This condition affects breathing during sleep. The most common cause is a collapsed or blocked airway. The goal of treatment is to help you breathe normally while you sleep. This information is not intended to replace advice given to you by your health care provider. Make sure you discuss any questions you have with your healthcare provider. Document Revised: 12/01/2019 Document Reviewed: 12/01/2019 Elsevier Patient Education  2022 [employer].

## 2020-07-31 NOTE — Telephone Encounter (Signed)
Pt saw Dr Lorel Monaco who thought ok to resume amiodarone 400 bid x 2 weeks, 400 qd x 2 weeks and then 200 daily  Would refill mex for now and add amio and then we will wean mex as we can over the next few months

## 2020-07-31 NOTE — Telephone Encounter (Signed)
*  STAT* If patient is at the pharmacy, call can be transferred to refill team.   1. Which medications need to be refilled? (please list name of each medication and dose if known)  Mexitil 2. Which pharmacy/location (including street and city if local pharmacy) is medication to be sent to? [facility] [address]  3. Do they need a 30 day or 90 day supply? 30

## 2020-07-31 NOTE — Progress Notes (Signed)
@Patient  ID: , female    DOB: May 31, 1945, 75 y.o.   MRN: [id number]  No chief complaint on file.   Referring provider: No ref. provider found  HPI: 75 year old female, never smoked.  Past medical history significant for complex sleep apnea syndrome, A. fib, chronic systolic heart failure, hypertension, atherosclerosis, nonischemic cardiomyopathy, pulmonary hypertension, hyperthyroidism, chronic kidney disease stage III.  Former patient of Dr. 61, last seen in office on 07/18/2013. NPSG 2006, AHI 77/hr.   07/31/2020- Interim hx Patient presents today for overdue follow-up for severe complex sleep apnea. She is not currently wearing BIPAP machine, she states that her machine stopped working a month or so ago. Last compliance reports is from April 2022 which showed 37% compliance. Pressure setting 14/10 with residual AHI 26. She has been sleeping in recliner chair. She reports loud snoring and daytime fatigue. She goes to bed around 12:30am. It takes her <30 mins to fall asleep. She wakes up 1-2 times at night. She gets out of bed 6:30am. Epworth 2.  Airview download 03/14/20-04/12/20: Usage 11/20 days; 37% > 4 hours Average usage (days used ) 5 hours 29 mins  Pressure IPAP 14/ EPAP 10cm h20  Leaks 22.6L/min  AHI 28.6   No Known Allergies  Immunization History  Administered Date(s) Administered   Influenza,inj,Quad PF,6+ Mos 12/04/2016, 12/07/2017   Influenza,inj,quad, With Preservative 10/05/2017    Past Medical History:  Diagnosis Date   Anginal pain (HCC)    Automatic implantable cardioverter-defibrillator in situ    a. s/p MDT ICD 07/2012; b. SN # [id number] 08/2012    CAD (coronary artery disease)    a. cath 2003: LM nl, mid LAD 50%, LCx nl, RCA nl b. L&RHC 08/17/2014 100% occluded mid LCx, 70% mid LAD with FFR 0.82. Medical therapy. CI 2.1. CO 4.01c. LHC 8/18: CTO LCx w/ R-L colatts, LAD s/p PCI/DES x 2   Chronic systolic CHF (congestive heart failure) (HCC)    a. echo  2014: EF 25%, diffuse HK, LA moderately dilated, RA mildly dilated, PASP 38 mm Hg; b. echo 08/2014: EF 25-30%, diffuse HK, RWMA cannot be excluded, mild MR, mild biatrial enlargement, RV mildly dilated, wall thickness nl, pacer wire/catheter noted, mild to mod TR, PASP high nl, c. TTE 8/18: EF 25-30%, diffuse HK, GR1DD, mod MR, mod dilated LA, nl RV sys fxn, PASP 56   Encounter for long-term (current) use of anticoagulants    Exertional shortness of breath    H/O medication noncompliance    High cholesterol    Hypertension    NICM (nonischemic cardiomyopathy) (HCC)    a. s/p AICD 07/22/2012   Obesity    OSA on CPAP    "suppose to; not often" (07/22/2012)   Other "heavy-for-dates" infants    PAF (paroxysmal atrial fibrillation) (HCC)    a. on warfarin--> Eliquis 08/2014    Tobacco History: Social History   Tobacco Use  Smoking Status Never  Smokeless Tobacco Never   Counseling given: Not Answered   Outpatient Medications Prior to Visit  Medication Sig Dispense Refill   apixaban (ELIQUIS) 5 MG TABS tablet Take 1 tablet (5 mg total) by mouth 2 (two) times daily. 180 tablet 1   atorvastatin (LIPITOR) 40 MG tablet Take 1 tablet (40 mg total) by mouth daily. 90 tablet 0   clopidogrel (PLAVIX) 75 MG tablet Take 1 tablet (75 mg total) by mouth daily. 90 tablet 0   ezetimibe (ZETIA) 10 MG tablet Take 1 tablet (10 mg total) by  mouth daily. 90 tablet 0   losartan (COZAAR) 50 MG tablet Take 1 tablet (50 mg total) by mouth daily. 90 tablet 0   methimazole (TAPAZOLE) 5 MG tablet Take 1 tablet (5 mg total) by mouth 3 (three) times a week. 40 tablet 3   mexiletine (MEXITIL) 200 MG capsule Take 1 capsule (200 mg total) by mouth 2 (two) times daily. 60 capsule 0   propranolol ER (INDERAL LA) 160 MG SR capsule Take 1 capsule (160 mg total) by mouth daily. 90 capsule 0   sacubitril-valsartan (ENTRESTO) 24-26 MG Take 1 tablet by mouth 2 (two) times daily. 180 tablet 3   spironolactone (ALDACTONE) 25 MG  tablet Take 0.5 tablets (12.5 mg total) by mouth daily. 45 tablet 0   torsemide (DEMADEX) 20 MG tablet Take 2 tablets (40 mg total) by mouth daily. 180 tablet 0   No facility-administered medications prior to visit.      Review of Systems  Review of Systems  Constitutional: Negative.  Negative for fatigue.  Respiratory: Negative.  Negative for apnea.   Psychiatric/Behavioral: Negative.  Negative for sleep disturbance.     Physical Exam  BP 110/80 (BP Location: Left Arm, Patient Position: Sitting, Cuff Size: Normal)   Pulse 74   Temp (!) 96.6 F (35.9 C) (Oral)   Ht 5' (1.524 m)   Wt 206 lb 3.2 oz (93.5 kg)   SpO2 96%   BMI 40.27 kg/m  Physical Exam Constitutional:      Appearance: Normal appearance. She is obese.  HENT:     Mouth/Throat:     Comments: Deferred d/t masking  Cardiovascular:     Rate and Rhythm: Normal rate and regular rhythm.  Pulmonary:     Effort: Pulmonary effort is normal.     Breath sounds: Normal breath sounds.  Skin:    General: Skin is warm and dry.  Neurological:     General: No focal deficit present.     Mental Status: She is alert and oriented to person, place, and time. Mental status is at baseline.  Psychiatric:        Mood and Affect: Mood normal.        Behavior: Behavior normal.        Thought Content: Thought content normal.        Judgment: Judgment normal.     Lab Results:  CBC    Component Value Date/Time   WBC 7.9 06/18/2020 1324   RBC 4.50 06/18/2020 1324   HGB 12.4 06/18/2020 1324   HGB 10.8 (L) 12/13/2018 1241   HCT 36.5 06/18/2020 1324   HCT 33.5 (L) 12/13/2018 1241   PLT 274 06/18/2020 1324   PLT 324 12/13/2018 1241   MCV 81.1 06/18/2020 1324   MCV 76 (L) 12/13/2018 1241   MCH 27.6 06/18/2020 1324   MCHC 34.0 06/18/2020 1324   RDW 14.5 06/18/2020 1324   RDW 15.1 12/13/2018 1241   LYMPHSABS 3.2 06/18/2020 1324   LYMPHSABS 2.8 12/13/2018 1241   MONOABS 1.1 (H) 06/18/2020 1324   EOSABS 0.4 06/18/2020 1324    EOSABS 0.3 12/13/2018 1241   BASOSABS 0.1 06/18/2020 1324   BASOSABS 0.1 12/13/2018 1241    BMET    Component Value Date/Time   NA 141 06/18/2020 1240   K 4.8 06/18/2020 1240   CL 100 06/18/2020 1240   CO2 22 06/18/2020 1240   GLUCOSE 96 06/18/2020 1240   GLUCOSE 92 07/28/2019 0822   BUN 19 06/18/2020 1240   CREATININE  1.29 (H) 06/18/2020 1240   CALCIUM 9.7 06/18/2020 1240   GFRNONAA 46 (L) 12/06/2019 1112   GFRAA 53 (L) 12/06/2019 1112    BNP    Component Value Date/Time   BNP 148.0 (H) 07/09/2018 1533    ProBNP    Component Value Date/Time   PROBNP 559.0 (H) 05/02/2012 1633    Imaging: No results found.   Assessment & Plan:   Complex sleep apnea syndrome - NPSG 2006, AHI 77/hr with complex apnea - Not currently using BIPAP d/t broken machine. Compliance report from April 2022 showed 37% compliance. Pressure setting 14/10 with residual AHI 26/HR - Needs BIPAP titration and we will place and order for new machine  - FU in 3 months with Dr. Craige Cotta or [place] in [place], [place] 07/31/2020

## 2020-07-31 NOTE — Assessment & Plan Note (Signed)
-   NPSG 2006, AHI 77/hr with complex apnea - Not currently using BIPAP d/t broken machine. Compliance report from April 2022 showed 37% compliance. Pressure setting 14/10 with residual AHI 26/HR - Needs BIPAP titration and we will place and order for new machine  - FU in 3 months with Dr. Craige Cotta or Belia Heman in [place]

## 2020-08-01 ENCOUNTER — Encounter: Payer: Self-pay | Admitting: *Deleted

## 2020-08-01 MED ORDER — AMIODARONE HCL 400 MG PO TABS: ORAL_TABLET | ORAL | 0 refills | Status: DC

## 2020-08-01 NOTE — Telephone Encounter (Signed)
I spoke with the patient. I advised her I was calling with recommendations from Dr. Graciela Husbands, after his discussion with Dr. Everardo All, on resuming her amiodarone. She is aware that the plan is to have some cross over between the amiodarone and the mexiletine with plans to eventually wean her off the mexiletine.   She is aware: 1) Continue mexiletine as she is currently taking now  2) START amiodarone 400 mg: - take 1 tablet (400 mg) TWICE daily x 2 weeks, then  - take 1 tablet (400 mg) ONCE daily x 2 weeks, then  3) in 3 weeks, I will send in a prescription for the lower dose amiodarone - in 4 weeks START amiodarone 200 mg ONCE daily  I have advised her she is not due to see Dr. Graciela Husbands back until December, but I am going to schedule her to follow up in 6-8 weeks so we can follow her VT trends and timing of weaning the mexiletine.   The patient is agreeable with an appointment on 09/10/20 at 8:40 am.  She voices understanding of these instructions, but I have advised her I will also mail her a written copy- she was very appreciative for this.   RX's refilled to the pharmacy.

## 2020-08-05 MED ORDER — AMIODARONE HCL 200 MG PO TABS: ORAL_TABLET | ORAL | 0 refills | Status: DC

## 2020-08-05 NOTE — Addendum Note (Signed)
Addended by: Sherri Rad C on: 08/05/2020 04:42 PM   Modules accepted: Orders

## 2020-08-05 NOTE — Telephone Encounter (Signed)
RX for amiodarone 200 mg tablets sent to the pharmacy: - take 2 tablets (400 mg) by mouth TWICE daily - take 2 tablets (400 mg) by mouth ONCE daily

## 2020-08-05 NOTE — Progress Notes (Signed)
Reviewed and agree with assessment/plan.   Timisha Mondry, MD [facility] 08/05/2020, 8:27 AM Pager:  [phone]  

## 2020-08-05 NOTE — Telephone Encounter (Signed)
Received fax from [facility] pharmacy stating patient's insurance will not cover Amiodarone 400 mg but they will cover Amiodarone 200 mg. Please advise and send appropriate prescription to selected pharmacy on file. Thank you

## 2020-08-07 NOTE — Telephone Encounter (Signed)
Patient calling to go over med change instructions again.

## 2020-08-12 ENCOUNTER — Telehealth: Payer: Self-pay | Admitting: Internal Medicine

## 2020-08-12 NOTE — Telephone Encounter (Signed)
Please call to discuss Amiodarone.

## 2020-08-12 NOTE — Telephone Encounter (Signed)
I spoke with the patient. She advised she did not receive the written copy of instructions that I mailed to her on 08/01/20 for her amiodarone loading instructions.  We did go over these again in detail today and she was able to write these down:  1) Continue mexiletine as you are currently taking now   2) START amiodarone 200 mg: - take 2 tablets (400 mg) TWICE daily x 2 weeks, then - take 2 tablets (400 mg) ONCE daily x 2 weeks, then   3) in 3 weeks, I will send in a prescription for the lower dose amiodarone - in 4 weeks START amiodarone 200 mg ONCE daily   Dr. Graciela Husbands will see you on Tuesday September 10, 2020 at 8:40 am in the [place] office.  The patient voices understanding and is agreeable.  She has not started amiodarone yet, but will start this tomorrow at 400 mg BID.

## 2020-08-15 ENCOUNTER — Institutional Professional Consult (permissible substitution): Payer: Medicare Other | Admitting: Pulmonary Disease

## 2020-08-23 ENCOUNTER — Telehealth: Payer: Self-pay | Admitting: *Deleted

## 2020-08-23 MED ORDER — AMIODARONE HCL 200 MG PO TABS: 200.0000 mg | ORAL_TABLET | Freq: Every day | ORAL | 1 refills | Status: DC

## 2020-08-23 NOTE — Telephone Encounter (Signed)
The patient was recently started on a amiodarone load per Dr. Graciela Husbands. Sending in maintenance dose RX as the patient is due to taper this down in the next 1-2 weeks.

## 2020-08-23 NOTE — Telephone Encounter (Signed)
-----   Message from Jefferey Pica, RN sent at 08/01/2020 10:42 AM EDT ----- Send in Amiodarone 200 mg once daily - see 7/24 phone note.

## 2020-08-26 DIAGNOSIS — R059 Cough, unspecified: Secondary | ICD-10-CM | POA: Diagnosis not present

## 2020-08-26 DIAGNOSIS — J019 Acute sinusitis, unspecified: Secondary | ICD-10-CM | POA: Diagnosis not present

## 2020-08-27 ENCOUNTER — Emergency Department
Admission: EM | Admit: 2020-08-27 | Discharge: 2020-08-27 | Disposition: A | Discharge status: Home / Self Care | Payer: Medicare Other | Attending: Emergency Medicine | Admitting: Emergency Medicine

## 2020-08-27 ENCOUNTER — Emergency Department: Payer: Medicare Other

## 2020-08-27 ENCOUNTER — Other Ambulatory Visit: Payer: Self-pay

## 2020-08-27 DIAGNOSIS — I499 Cardiac arrhythmia, unspecified: Secondary | ICD-10-CM | POA: Diagnosis not present

## 2020-08-27 DIAGNOSIS — I25119 Atherosclerotic heart disease of native coronary artery with unspecified angina pectoris: Secondary | ICD-10-CM | POA: Diagnosis not present

## 2020-08-27 DIAGNOSIS — R059 Cough, unspecified: Secondary | ICD-10-CM | POA: Diagnosis not present

## 2020-08-27 DIAGNOSIS — Z95810 Presence of automatic (implantable) cardiac defibrillator: Secondary | ICD-10-CM | POA: Diagnosis not present

## 2020-08-27 DIAGNOSIS — N183 Chronic kidney disease, stage 3 unspecified: Secondary | ICD-10-CM | POA: Diagnosis not present

## 2020-08-27 DIAGNOSIS — I130 Hypertensive heart and chronic kidney disease with heart failure and stage 1 through stage 4 chronic kidney disease, or unspecified chronic kidney disease: Secondary | ICD-10-CM | POA: Diagnosis not present

## 2020-08-27 DIAGNOSIS — Z955 Presence of coronary angioplasty implant and graft: Secondary | ICD-10-CM | POA: Diagnosis not present

## 2020-08-27 DIAGNOSIS — I4891 Unspecified atrial fibrillation: Secondary | ICD-10-CM | POA: Diagnosis not present

## 2020-08-27 DIAGNOSIS — Z79899 Other long term (current) drug therapy: Secondary | ICD-10-CM | POA: Insufficient documentation

## 2020-08-27 DIAGNOSIS — Z743 Need for continuous supervision: Secondary | ICD-10-CM | POA: Diagnosis not present

## 2020-08-27 DIAGNOSIS — Z7901 Long term (current) use of anticoagulants: Secondary | ICD-10-CM | POA: Insufficient documentation

## 2020-08-27 DIAGNOSIS — T82897A Other specified complication of cardiac prosthetic devices, implants and grafts, initial encounter: Secondary | ICD-10-CM | POA: Diagnosis not present

## 2020-08-27 DIAGNOSIS — I517 Cardiomegaly: Secondary | ICD-10-CM | POA: Diagnosis not present

## 2020-08-27 DIAGNOSIS — Z20822 Contact with and (suspected) exposure to covid-19: Secondary | ICD-10-CM | POA: Insufficient documentation

## 2020-08-27 DIAGNOSIS — R0902 Hypoxemia: Secondary | ICD-10-CM | POA: Diagnosis not present

## 2020-08-27 DIAGNOSIS — T82190A Other mechanical complication of cardiac electrode, initial encounter: Secondary | ICD-10-CM

## 2020-08-27 DIAGNOSIS — I5022 Chronic systolic (congestive) heart failure: Secondary | ICD-10-CM | POA: Insufficient documentation

## 2020-08-27 DIAGNOSIS — R0981 Nasal congestion: Secondary | ICD-10-CM | POA: Diagnosis not present

## 2020-08-27 LAB — CBC WITH DIFFERENTIAL/PLATELET
Abs Immature Granulocytes: 0.02 10*3/uL (ref 0.00–0.07)
Basophils Absolute: 0.1 10*3/uL (ref 0.0–0.1)
Basophils Relative: 1 %
Eosinophils Absolute: 0.4 10*3/uL (ref 0.0–0.5)
Eosinophils Relative: 5 %
HCT: 39.9 % (ref 36.0–46.0)
Hemoglobin: 13.5 g/dL (ref 12.0–15.0)
Immature Granulocytes: 0 %
Lymphocytes Relative: 33 %
Lymphs Abs: 2.4 10*3/uL (ref 0.7–4.0)
MCH: 28.4 pg (ref 26.0–34.0)
MCHC: 33.8 g/dL (ref 30.0–36.0)
MCV: 83.8 fL (ref 80.0–100.0)
Monocytes Absolute: 1.1 10*3/uL — ABNORMAL HIGH (ref 0.1–1.0)
Monocytes Relative: 15 %
Neutro Abs: 3.3 10*3/uL (ref 1.7–7.7)
Neutrophils Relative %: 46 %
Platelets: 278 10*3/uL (ref 150–400)
RBC: 4.76 MIL/uL (ref 3.87–5.11)
RDW: 14.3 % (ref 11.5–15.5)
WBC: 7.2 10*3/uL (ref 4.0–10.5)
nRBC: 0 % (ref 0.0–0.2)

## 2020-08-27 LAB — BASIC METABOLIC PANEL
Anion gap: 8 (ref 5–15)
BUN: 36 mg/dL — ABNORMAL HIGH (ref 8–23)
CO2: 30 mmol/L (ref 22–32)
Calcium: 9.6 mg/dL (ref 8.9–10.3)
Chloride: 99 mmol/L (ref 98–111)
Creatinine, Ser: 1.83 mg/dL — ABNORMAL HIGH (ref 0.44–1.00)
GFR, Estimated: 28 mL/min — ABNORMAL LOW (ref >60)
Glucose, Bld: 103 mg/dL — ABNORMAL HIGH (ref 70–99)
Potassium: 4.3 mmol/L (ref 3.5–5.1)
Sodium: 137 mmol/L (ref 135–145)

## 2020-08-27 LAB — RESP PANEL BY RT-PCR (FLU A&B, COVID) ARPGX2
Influenza A by PCR: NEGATIVE
Influenza B by PCR: NEGATIVE
SARS Coronavirus 2 by RT PCR: NEGATIVE

## 2020-08-27 LAB — MAGNESIUM: Magnesium: 2.5 mg/dL — ABNORMAL HIGH (ref 1.7–2.4)

## 2020-08-27 LAB — BRAIN NATRIURETIC PEPTIDE: B Natriuretic Peptide: 322.1 pg/mL — ABNORMAL HIGH (ref 0.0–100.0)

## 2020-08-27 LAB — TROPONIN I (HIGH SENSITIVITY)
Troponin I (High Sensitivity): 7 ng/L (ref <18)
Troponin I (High Sensitivity): 5 ng/L (ref <18)

## 2020-08-27 NOTE — ED Notes (Addendum)
EDP at bedside to update pt. Still waiting for [employer] representative and cardiologist assessment for POC.

## 2020-08-27 NOTE — ED Provider Notes (Addendum)
Dr. Okey Dupre calls back.  He is discussed the patient with the EP doctors at [facility].  It is felt that [employer] should come in and evaluate the per patient in person.  The patient may need to go to [facility] for further work on her pacer.  I will let the patient know and we will await further developments.   Arnaldo Natal, MD 08/27/20 [phone] ----------------------------------------- 12:48 PM on 08/27/2020 ----------------------------------------- We have just heard that [employer] has evaluated the pacemaker and it appears to be functioning normally.  She can follow-up with Dr. Graciela Husbands outpatient.  We will let the patient know.   Arnaldo Natal, MD 08/27/20 1248

## 2020-08-27 NOTE — ED Notes (Signed)
Pt assisted to the bathroom.

## 2020-08-27 NOTE — ED Notes (Signed)
Pt given ice chips. Pt and family updated on waiting for [employer] representative to come.

## 2020-08-27 NOTE — ED Notes (Signed)
Pacemaker interrogated, pt resting with family at bedside.

## 2020-08-27 NOTE — ED Notes (Signed)
[employer] report taken, EDP made aware.

## 2020-08-27 NOTE — ED Notes (Signed)
[employer] on call representative contacted per interrogator request, awaiting call back.

## 2020-08-27 NOTE — ED Provider Notes (Signed)
[facility] Emergency Department Provider Note  ____________________________________________   Event Date/Time   First MD Initiated Contact with Patient 08/27/20 [phone]     (approximate)  I have reviewed the triage vital signs and the nursing notes.   HISTORY  Chief Complaint Cough    HPI Erin [name] is a 75 y.o. female with history of coronary artery disease, CHF, hypertension, hyperlipidemia,'s nonischemic cardiomyopathy status post AICD on 07/22/2012, proximal atrial fibrillation on Eliquis who presents to the emergency department with EMS after her defibrillator was making an alarm tonight.  She did not feel any shocks.  No chest pain or shortness of breath.  No palpitations.  No lower extremity swelling or pain.   Also reports she has had nasal congestion and a productive cough for the past 2 weeks.  No fevers.  Was seen at urgent care yesterday and was put on doxycycline and Tessalon Perles.  Did not have any testing for COVID-19 or influenza.  Denies nausea, vomiting or diarrhea.    Cardiologist is Dr. Mariah Milling.  Also sees Dr. Graciela Husbands with EP.        Past Medical History:  Diagnosis Date   Anginal pain (HCC)    Automatic implantable cardioverter-defibrillator in situ    a. s/p MDT ICD 07/2012; b. SN # [id number]    CAD (coronary artery disease)    a. cath 2003: LM nl, mid LAD 50%, LCx nl, RCA nl b. L&RHC 08/17/2014 100% occluded mid LCx, 70% mid LAD with FFR 0.82. Medical therapy. CI 2.1. CO 4.01c. LHC 8/18: CTO LCx w/ R-L colatts, LAD s/p PCI/DES x 2   Chronic systolic CHF (congestive heart failure) (HCC)    a. echo 2014: EF 25%, diffuse HK, LA moderately dilated, RA mildly dilated, PASP 38 mm Hg; b. echo 08/2014: EF 25-30%, diffuse HK, RWMA cannot be excluded, mild MR, mild biatrial enlargement, RV mildly dilated, wall thickness nl, pacer wire/catheter noted, mild to mod TR, PASP high nl, c. TTE 8/18: EF 25-30%, diffuse HK, GR1DD, mod MR, mod  dilated LA, nl RV sys fxn, PASP 56   Encounter for long-term (current) use of anticoagulants    Exertional shortness of breath    H/O medication noncompliance    High cholesterol    Hypertension    NICM (nonischemic cardiomyopathy) (HCC)    a. s/p AICD 07/22/2012   Obesity    OSA on CPAP    "suppose to; not often" (07/22/2012)   Other "heavy-for-dates" infants    PAF (paroxysmal atrial fibrillation) (HCC)    a. on warfarin--> Eliquis 08/2014    Patient Active Problem List   Diagnosis Date Noted   Hyperthyroidism 01/12/2019   High risk medication use 06/28/2018   Unstable angina (HCC)    Ventricular tachycardia (HCC) 08/20/2016   Ventricular fibrillation (HCC) 08/19/2016   CKD (chronic kidney disease) stage 3, GFR 30-59 ml/min (HCC) 08/19/2016   Chronic systolic heart failure (HCC) 08/30/2014   Atherosclerosis of native coronary artery of native heart with stable angina pectoris (HCC)    Anemia 08/15/2014   H/O medication noncompliance    PAF (paroxysmal atrial fibrillation) (HCC)    Atrial fibrillation with RVR (HCC) 08/13/2014   Chronic systolic CHF (congestive heart failure) (HCC) 09/30/2012   Essential hypertension 08/24/2012   NICM (nonischemic cardiomyopathy) (HCC) 07/23/2012   AICD (automatic cardioverter/defibrillator) present 07/23/2012   Long term current use of anticoagulant therapy 07/23/2010   Complex sleep apnea syndrome 11/20/2007   CORONARY ATHEROSCLEROSIS NATIVE  CORONARY ARTERY 11/20/2007   Pulmonary HTN (HCC) 11/20/2007    Past Surgical History:  Procedure Laterality Date   ABDOMINAL HYSTERECTOMY  ~ 1998   "laparoscopic" (07/22/2012)   CARDIAC CATHETERIZATION  ? 1990's   CARDIAC CATHETERIZATION N/A 08/17/2014   Procedure: Right/Left Heart Cath and Coronary Angiography;  Surgeon: Kathleene Hazel, MD;  Location: [facility] INVASIVE CV LAB;  Service: Cardiovascular;  Laterality: N/A;   CARDIAC DEFIBRILLATOR PLACEMENT  07/22/2012   dual-chamber ICD.    CARDIOVERSION N/A 01/13/2019   Procedure: CARDIOVERSION;  Surgeon: Antonieta Iba, MD;  Location: [facility] ORS;  Service: Cardiovascular;  Laterality: N/A;   CORONARY STENT INTERVENTION N/A 08/24/2016   Procedure: CORONARY STENT INTERVENTION;  Surgeon: Iran Ouch, MD;  Location: [facility] INVASIVE CV LAB;  Service: Cardiovascular;  Laterality: N/A;   IMPLANTABLE CARDIOVERTER DEFIBRILLATOR IMPLANT N/A 07/22/2012   Procedure: IMPLANTABLE CARDIOVERTER DEFIBRILLATOR IMPLANT;  Surgeon: Marinus Maw, MD;  Location: [facility] CATH LAB;  Service: Cardiovascular;  Laterality: N/A;   LEFT HEART CATH AND CORONARY ANGIOGRAPHY N/A 08/24/2016   Procedure: LEFT HEART CATH AND CORONARY ANGIOGRAPHY;  Surgeon: Iran Ouch, MD;  Location: [facility] INVASIVE CV LAB;  Service: Cardiovascular;  Laterality: N/A;   TEE WITHOUT CARDIOVERSION N/A 08/21/2014   Procedure: TRANSESOPHAGEAL ECHOCARDIOGRAM (TEE);  Surgeon: Chilton Si, MD;  Location: [facility] ENDOSCOPY;  Service: Cardiovascular;  Laterality: N/A;   TUBAL LIGATION  ~ 1978    Prior to Admission medications   Medication Sig Start Date End Date Taking? Authorizing Provider  amiodarone (PACERONE) 200 MG tablet Take 1 tablet (200 mg total) by mouth daily. 08/23/20   Duke Salvia, MD  apixaban (ELIQUIS) 5 MG TABS tablet Take 1 tablet (5 mg total) by mouth 2 (two) times daily. 03/04/20   Antonieta Iba, MD  atorvastatin (LIPITOR) 40 MG tablet Take 1 tablet (40 mg total) by mouth daily. 07/09/20   Antonieta Iba, MD  clopidogrel (PLAVIX) 75 MG tablet Take 1 tablet (75 mg total) by mouth daily. 07/09/20   Antonieta Iba, MD  ezetimibe (ZETIA) 10 MG tablet Take 1 tablet (10 mg total) by mouth daily. 07/09/20 10/07/20  Antonieta Iba, MD  losartan (COZAAR) 50 MG tablet Take 1 tablet (50 mg total) by mouth daily. 07/09/20   Antonieta Iba, MD  methimazole (TAPAZOLE) 5 MG tablet Take 1 tablet (5 mg total) by mouth 3 (three) times a week. 07/12/20   Romero Belling, MD  mexiletine  (MEXITIL) 200 MG capsule Take 1 capsule by mouth twice daily 08/01/20   Duke Salvia, MD  mexiletine (MEXITIL) 200 MG capsule Take 1 capsule (200 mg total) by mouth 2 (two) times daily. 07/31/20   Antonieta Iba, MD  propranolol ER (INDERAL LA) 160 MG SR capsule Take 1 capsule (160 mg total) by mouth daily. 07/09/20   Antonieta Iba, MD  sacubitril-valsartan (ENTRESTO) 24-26 MG Take 1 tablet by mouth 2 (two) times daily. 07/17/20   Antonieta Iba, MD  spironolactone (ALDACTONE) 25 MG tablet Take 0.5 tablets (12.5 mg total) by mouth daily. 07/09/20   Antonieta Iba, MD  torsemide (DEMADEX) 20 MG tablet Take 2 tablets (40 mg total) by mouth daily. 07/09/20   Antonieta Iba, MD    Allergies Patient has no known allergies.  Family History  Problem Relation Age of Onset   Heart disease Mother    Heart disease Father    Lung disease Sister    Diabetes Sister  Heart failure Brother    Thyroid disease Neg Hx     Social History Social History   Tobacco Use   Smoking status: Never   Smokeless tobacco: Never  Vaping Use   Vaping Use: Never used  Substance Use Topics   Alcohol use: No   Drug use: No    Review of Systems Constitutional: No fever. Eyes: No visual changes. ENT: No sore throat. Cardiovascular: Denies chest pain. Respiratory: Denies shortness of breath. Gastrointestinal: No nausea, vomiting, diarrhea. Genitourinary: Negative for dysuria. Musculoskeletal: Negative for back pain. Skin: Negative for rash. Neurological: Negative for focal weakness or numbness.  ____________________________________________   PHYSICAL EXAM:  VITAL SIGNS: Vitals:   08/27/20 0430 08/27/20 0530  BP: 138/71 104/73  Pulse: 61 (!) 54  Resp: 17 15  Temp:    SpO2: 99% 92%    CONSTITUTIONAL: Alert and oriented and responds appropriately to questions. Well-appearing; well-nourished, elderly, no distress HEAD: Normocephalic EYES: Conjunctivae clear, pupils appear equal, EOM  appear intact ENT: normal nose; moist mucous membranes NECK: Supple, normal ROM CARD: RRR; S1 and S2 appreciated; no murmurs, no clicks, no rubs, no gallops RESP: Normal chest excursion without splinting or tachypnea; breath sounds clear and equal bilaterally; no wheezes, no rhonchi, no rales, no hypoxia or respiratory distress, speaking full sentences ABD/GI: Normal bowel sounds; non-distended; soft, non-tender, no rebound, no guarding, no peritoneal signs, no hepatosplenomegaly BACK: The back appears normal EXT: Normal ROM in all joints; no deformity noted, no edema; no cyanosis, no calf tenderness or calf swelling SKIN: Normal color for age and race; warm; no rash on exposed skin NEURO: Moves all extremities equally, normal speech, no facial asymmetry PSYCH: The patient's mood and manner are appropriate.  ____________________________________________   LABS (all labs ordered are listed, but only abnormal results are displayed)  Labs Reviewed  CBC WITH DIFFERENTIAL/PLATELET - Abnormal; Notable for the following components:      Result Value   Monocytes Absolute 1.1 (*)    All other components within normal limits  BASIC METABOLIC PANEL - Abnormal; Notable for the following components:   Glucose, Bld 103 (*)    BUN 36 (*)    Creatinine, Ser 1.83 (*)    GFR, Estimated 28 (*)    All other components within normal limits  MAGNESIUM - Abnormal; Notable for the following components:   Magnesium 2.5 (*)    All other components within normal limits  BRAIN NATRIURETIC PEPTIDE - Abnormal; Notable for the following components:   B Natriuretic Peptide 322.1 (*)    All other components within normal limits  RESP PANEL BY RT-PCR (FLU A&B, COVID) ARPGX2  TROPONIN I (HIGH SENSITIVITY)  TROPONIN I (HIGH SENSITIVITY)   ____________________________________________  EKG   EKG Interpretation  Date/Time:  Tuesday August 27 2020 04:02:02 EDT Ventricular Rate:  60 PR Interval:  228 QRS  Duration: 112 QT Interval:  482 QTC Calculation: 482 R Axis:   28 Text Interpretation: Sinus rhythm Prolonged PR interval Low voltage, precordial leads Consider anterior infarct No significant change since last tracing Confirmed by Rochele Raring [phone]) on 08/27/2020 4:04:28 AM        ____________________________________________  RADIOLOGY Normajean Baxter Suvi Archuletta, personally viewed and evaluated these images (plain radiographs) as part of my medical decision making, as well as reviewing the written report by the radiologist.  ED MD interpretation: Chest x-ray shows no acute abnormality.  Defibrillator and wires appear appropriately placed.  Official radiology report(s): DG Chest 2 View  Result Date: 08/27/2020  CLINICAL DATA:  Nasal congestion, cough EXAM: CHEST - 2 VIEW COMPARISON:  08/19/2016 FINDINGS: Lung volumes are small but are symmetric and are clear. No pneumothorax or pleural effusion. Mild cardiomegaly is stable. Left subclavian dual lead pacemaker defibrillator is unchanged. Pulmonary vascularity is normal. No acute bone abnormality. IMPRESSION: No active cardiopulmonary disease.  Stable cardiomegaly. Electronically Signed   By: Helyn Numbers M.D.   On: 08/27/2020 04:35    ____________________________________________   PROCEDURES  Procedure(s) performed (including Critical Care):  Procedures   ____________________________________________   INITIAL IMPRESSION / ASSESSMENT AND PLAN / ED COURSE  As part of my medical decision making, I reviewed the following data within the electronic MEDICAL RECORD NUMBER Nursing notes reviewed and incorporated, Labs reviewed , EKG interpreted , Old EKG reviewed, Old chart reviewed, Patient signed out to oncoming ED physician, Radiograph reviewed , A consult was requested and obtained from this/these consultant(s) Cardiology, and Notes from prior ED visits         Patient here after her pacemaker/defibrillator was alarming tonight.  EMS states  that alarmed while in route and they obtained a twelve-lead which showed no acute abnormality.  She has not felt any shocks.  She is not having chest pain, shortness of breath or palpitations.  We will interrogate her pacemaker for further evaluation.  Will check cardiac labs, electrolytes, hemoglobin.  EKG, chest x-ray also pending.  Patient also reports 2 weeks of nasal congestion and cough.  Was seen in urgent care and started on doxycycline.  She agrees to COVID and flu testing today.  We will also evaluate her chest x-ray.  Lungs are clear and she is not hypoxic.  No increased work of breathing.  Does not appear fluid overloaded currently.  Doubt PE.  ED PROGRESS  Patient's labs are reassuring.  No leukocytosis or leukopenia.  No significant electrolyte derangement.  Normal hemoglobin.  Troponin negative.  Chest x-ray shows no acute abnormality with normal placement of her pacemaker/defibrillator.  Defibrillator was interrogated and appears that she is receiving warnings for an RV defib lead impedance.  They report she has also had 20 treated VT/VF episodes longer than 30 seconds the last being August 8.  No other arrhythmias noted since that time and none tonight.  5:30 AM  Spoke with Dr. Jens Som on call for [facility] health cardiology.  He recommends that we contact an electrophysiologist in the morning for further recommendations.  We will try to get in touch with EP at [facility].  She states this is where her pacemaker was placed in 2014.   Per [employer] report.  Episodes last cleared on June 18, 2020.  She has had 20 treated VT episodes most recent on August 12, 2020.  Episodes treated with ATP.  She has had 3230 VT nonsustained episodes most recent on August 14, 2020.  She has a RV defib lead impedance warning of 101 ohms on August 27, 2020.  Available daily lead and battery measurements within expected range.  7:05 AM  Spoke with Dr. Okey Dupre on-call for cardiology at 7 AM.  He will get in touch  with electrophysiology for further recommendations.   7:30 AM  Pt updated with plan.  No acute complaints at this time.  Awaiting to hear back from cardiology.  Signed out to Dr. Darnelle Catalan to follow-up on cardiology recommendations.  I reviewed all nursing notes and pertinent previous records as available.  I have reviewed and interpreted any EKGs, lab and urine results, imaging (as available).  ____________________________________________   FINAL CLINICAL IMPRESSION(S) / ED DIAGNOSES  Final diagnoses:  Cough  Nasal congestion  Abnormal lead impedance of implantable cardioverter-defibrillator (ICD), initial encounter     ED Discharge Orders     None       *Please note:  Candise BowensJulia M Carrithers was evaluated in Emergency Department on 08/27/2020 for the symptoms described in the history of present illness. She was evaluated in the context of the global COVID-19 pandemic, which necessitated consideration that the patient might be at risk for infection with the SARS-CoV-2 virus that causes COVID-19. Institutional protocols and algorithms that pertain to the evaluation of patients at risk for COVID-19 are in a state of rapid change based on information released by regulatory bodies including the CDC and federal and state organizations. These policies and algorithms were followed during the patient's care in the ED.  Some ED evaluations and interventions may be delayed as a result of limited staffing during and the pandemic.*   Note:  This document was prepared using Dragon voice recognition software and may include unintentional dictation errors.    Vickie Melnik, Layla MawKristen N, DO 08/27/20 [phone]

## 2020-08-27 NOTE — Progress Notes (Signed)
Spoke with [employer] representative. He reviewed the patient's device and report. ATP was appropriate and there is no evidence of RV lead failure. He indicated he did not recommend any device changes at this time and the device was functioning normally. After reviewing this information with EP, plan is for the patient to follow up with Dr. Graciela Husbands next week.

## 2020-08-27 NOTE — ED Triage Notes (Signed)
Pt brought in by EMS from home, pt was at home and felt like defibrillator activated due to "noise". Pt did not feel a shock or have any accompanying symptoms. Pt has had a cough for 2 weeks that is being treated with doxycycline.

## 2020-08-27 NOTE — Discharge Instructions (Signed)
Please follow-up with Dr. Graciela Husbands.  Call his office to set up the appointment.  Please return here for any further problems.  After comprehensive evaluation of your pacemaker it seems the leads are functioning normally and impedances okay.  Please do not hesitate to return though if the alarm goes off again.

## 2020-08-28 ENCOUNTER — Inpatient Hospital Stay: Admission: RE | Admit: 2020-08-28 | Payer: Medicare Other | Source: Ambulatory Visit

## 2020-08-28 ENCOUNTER — Telehealth: Payer: Self-pay | Admitting: Pulmonary Disease

## 2020-08-28 NOTE — Telephone Encounter (Signed)
I have spoke with Stacie at [facility] and she stated that she just spoke with the patient yesterday about getting her Covid test done 2 days before her sleep study on Friday.  When I spoke with the patient she stated that she was tested yesterday in the ER but I couldn't find her results. We kept talking and I did finally find them. I told the patient I would fax the Neg Covid Test result to [facility] at [facility].

## 2020-08-29 ENCOUNTER — Ambulatory Visit: Payer: Medicare Other | Admitting: Endocrinology

## 2020-08-30 ENCOUNTER — Ambulatory Visit: Payer: Medicare Other | Attending: Pulmonary Disease

## 2020-08-30 DIAGNOSIS — G4739 Other sleep apnea: Secondary | ICD-10-CM | POA: Insufficient documentation

## 2020-08-30 DIAGNOSIS — G4733 Obstructive sleep apnea (adult) (pediatric): Secondary | ICD-10-CM | POA: Insufficient documentation

## 2020-09-02 ENCOUNTER — Telehealth (INDEPENDENT_AMBULATORY_CARE_PROVIDER_SITE_OTHER): Payer: Medicare Other | Admitting: Pulmonary Disease

## 2020-09-02 DIAGNOSIS — G4731 Primary central sleep apnea: Secondary | ICD-10-CM

## 2020-09-02 NOTE — Telephone Encounter (Signed)
Unsuccesful titration, centrap apneas persisted on 18/13 Suggest , try autobilevel EPAP 8, PS +5, IPAP max20 If centrals persist on BiPAP downlaod after 8-12 weeks of use, may be limited since LVEF is low - ASV mode contraindicated

## 2020-09-06 ENCOUNTER — Other Ambulatory Visit: Payer: Self-pay

## 2020-09-07 DIAGNOSIS — S40022A Contusion of left upper arm, initial encounter: Secondary | ICD-10-CM | POA: Diagnosis not present

## 2020-09-07 DIAGNOSIS — S4992XA Unspecified injury of left shoulder and upper arm, initial encounter: Secondary | ICD-10-CM | POA: Diagnosis not present

## 2020-09-07 DIAGNOSIS — W010XXA Fall on same level from slipping, tripping and stumbling without subsequent striking against object, initial encounter: Secondary | ICD-10-CM | POA: Diagnosis not present

## 2020-09-07 DIAGNOSIS — M25561 Pain in right knee: Secondary | ICD-10-CM | POA: Diagnosis not present

## 2020-09-07 DIAGNOSIS — M7989 Other specified soft tissue disorders: Secondary | ICD-10-CM | POA: Diagnosis not present

## 2020-09-07 DIAGNOSIS — S8991XA Unspecified injury of right lower leg, initial encounter: Secondary | ICD-10-CM | POA: Diagnosis not present

## 2020-09-09 ENCOUNTER — Other Ambulatory Visit: Payer: Self-pay | Admitting: Cardiovascular Disease

## 2020-09-10 ENCOUNTER — Encounter: Payer: Self-pay | Admitting: Internal Medicine

## 2020-09-10 ENCOUNTER — Ambulatory Visit (INDEPENDENT_AMBULATORY_CARE_PROVIDER_SITE_OTHER): Payer: Medicare Other | Admitting: Internal Medicine

## 2020-09-10 ENCOUNTER — Other Ambulatory Visit: Payer: Self-pay

## 2020-09-10 VITALS — BP 100/66 | HR 60 | Ht <= 58 in | Wt 200.0 lb

## 2020-09-10 DIAGNOSIS — Z95810 Presence of automatic (implantable) cardiac defibrillator: Secondary | ICD-10-CM

## 2020-09-10 DIAGNOSIS — I4819 Other persistent atrial fibrillation: Secondary | ICD-10-CM | POA: Diagnosis not present

## 2020-09-10 DIAGNOSIS — I5022 Chronic systolic (congestive) heart failure: Secondary | ICD-10-CM

## 2020-09-10 DIAGNOSIS — I472 Ventricular tachycardia, unspecified: Secondary | ICD-10-CM

## 2020-09-10 DIAGNOSIS — I428 Other cardiomyopathies: Secondary | ICD-10-CM

## 2020-09-10 DIAGNOSIS — Z79899 Other long term (current) drug therapy: Secondary | ICD-10-CM | POA: Diagnosis not present

## 2020-09-10 NOTE — Patient Instructions (Addendum)
Medication Instructions:  Your physician has recommended you make the following change in your medication:  Stop Mexiletine  Samples Given: Eliquis 5 mg  Lot: [id number] Exp: 06/2022 # 2 boxes  *If you need a refill on your cardiac medications before your next appointment, please call your pharmacy*   Lab Work: Your physician recommends that you have lab work done today:  CMP TSH Free T3  Free T4  If you have labs (blood work) drawn today and your tests are completely normal, you will receive your results only by: MyChart Message (if you have MyChart) OR A paper copy in the mail If you have any lab test that is abnormal or we need to change your treatment, we will call you to review the results.   Testing/Procedures: None Ordered   Follow-Up: At [facility], you and your health needs are our priority.  As part of our continuing mission to provide you with exceptional heart care, we have created designated Provider Care Teams.  These Care Teams include your primary Cardiologist (physician) and Advanced Practice Providers (APPs -  Physician Assistants and Nurse Practitioners) who all work together to provide you with the care you need, when you need it.  We recommend signing up for the patient portal called "MyChart".  Sign up information is provided on this After Visit Summary.  MyChart is used to connect with patients for Virtual Visits (Telemedicine).  Patients are able to view lab/test results, encounter notes, upcoming appointments, etc.  Non-urgent messages can be sent to your provider as well.   To learn more about what you can do with MyChart, go to [email or web address].    Your next appointment:   3 month(s)  The format for your next appointment:   In Person  Provider:   Sherryl Manges, MD   Other Instructions

## 2020-09-10 NOTE — Progress Notes (Signed)
Patient Care Team: Patient, No Pcp Per (Inactive) as PCP - General (General Practice) Duke Salvia, MD as PCP - Electrophysiology (Cardiology) Delma Freeze, FNP as Nurse Practitioner (Cardiology) Duke Salvia, MD as Consulting Physician (Cardiology) Antonieta Iba, MD as Consulting Physician (Cardiology)   HPI  Erin [name] is a 75 y.o. female seen in followup for ICD implanted in the context of a nonischemic and ischemic cardiomyopathy; stenting x2 Now on Clopidigrel and apixoban and VT nonsustained.  Previously managed with amio, but stopped 2/2 hyperthyroidism, Rx w mex alone, and recently resumed Amio 2/2 increasing VT burden ( discussed with Dr Lorel Monaco -Endo)      The patient denies chest pain or peripheral edema.  There have been no palpitations, lightheadedness or syncope.  Complains of chronic dyspnea.  Fatigue.  She has untreated sleep apnea, awaiting CPAP machine so has some nocturnal dyspnea..  Patient denies symptoms of GI intolerance, sun sensitivity, neurological symptoms attributable to amiodarone.     Seen in the emergency room 8/23 because of ICD alarm.  There was appropriate ATP.  Apparently a question about high RV threshold although the report, RD-PA, "no evidence of RV lead failure "  Feeling a little bit better.  Just moved.         Date Cr Hgb TSH ALT  8/18   4.9 16  11/18    2.13   12/19  11.3 26>>6.04 (2/20)   5/20 1.35    56  7/20 1.49  0.68 52  12/20 1.09 10.5 0.006 11  3/21   0.02<<0.01   5/21  12.6 (7/21) 2.07 19  12/21 1.17  4.46<< 75   6/22 1.29  6.08   8/22 1.83       DATE TEST EF   5/14 Echo  20-25%   8/16 Echo  25 % (44/2.1/35)m LAE  8/18 Echo  20-25%   8/18 Cath  T Cx///90% LAD>>DES  11/18 Echo   25-30%   6/21 PYP  Ratio 1 equivocal         Thromboembolic risk factors ( age -72 , HTN-1, Vasc disease -1, CHF-1, Gender-1) for a CHADSVASc Score of >=6  Past Medical History:  Diagnosis Date   Anginal pain (HCC)     Automatic implantable cardioverter-defibrillator in situ    a. s/p MDT ICD 07/2012; b. SN # [id number]    CAD (coronary artery disease)    a. cath 2003: LM nl, mid LAD 50%, LCx nl, RCA nl b. L&RHC 08/17/2014 100% occluded mid LCx, 70% mid LAD with FFR 0.82. Medical therapy. CI 2.1. CO 4.01c. LHC 8/18: CTO LCx w/ R-L colatts, LAD s/p PCI/DES x 2   Chronic systolic CHF (congestive heart failure) (HCC)    a. echo 2014: EF 25%, diffuse HK, LA moderately dilated, RA mildly dilated, PASP 38 mm Hg; b. echo 08/2014: EF 25-30%, diffuse HK, RWMA cannot be excluded, mild MR, mild biatrial enlargement, RV mildly dilated, wall thickness nl, pacer wire/catheter noted, mild to mod TR, PASP high nl, c. TTE 8/18: EF 25-30%, diffuse HK, GR1DD, mod MR, mod dilated LA, nl RV sys fxn, PASP 56   Encounter for long-term (current) use of anticoagulants    Exertional shortness of breath    H/O medication noncompliance    High cholesterol    Hypertension    NICM (nonischemic cardiomyopathy) (HCC)    a. s/p AICD 07/22/2012   Obesity    OSA on CPAP    "  suppose to; not often" (07/22/2012)   Other "heavy-for-dates" infants    PAF (paroxysmal atrial fibrillation) (HCC)    a. on warfarin--> Eliquis 08/2014    Past Surgical History:  Procedure Laterality Date   ABDOMINAL HYSTERECTOMY  ~ 1998   "laparoscopic" (07/22/2012)   CARDIAC CATHETERIZATION  ? 1990's   CARDIAC CATHETERIZATION N/A 08/17/2014   Procedure: Right/Left Heart Cath and Coronary Angiography;  Surgeon: Kathleene Hazel, MD;  Location: [facility] INVASIVE CV LAB;  Service: Cardiovascular;  Laterality: N/A;   CARDIAC DEFIBRILLATOR PLACEMENT  07/22/2012   dual-chamber ICD.   CARDIOVERSION N/A 01/13/2019   Procedure: CARDIOVERSION;  Surgeon: Antonieta Iba, MD;  Location: [facility] ORS;  Service: Cardiovascular;  Laterality: N/A;   CORONARY STENT INTERVENTION N/A 08/24/2016   Procedure: CORONARY STENT INTERVENTION;  Surgeon: Iran Ouch, MD;  Location: [facility]  INVASIVE CV LAB;  Service: Cardiovascular;  Laterality: N/A;   IMPLANTABLE CARDIOVERTER DEFIBRILLATOR IMPLANT N/A 07/22/2012   Procedure: IMPLANTABLE CARDIOVERTER DEFIBRILLATOR IMPLANT;  Surgeon: Marinus Maw, MD;  Location: [facility] CATH LAB;  Service: Cardiovascular;  Laterality: N/A;   LEFT HEART CATH AND CORONARY ANGIOGRAPHY N/A 08/24/2016   Procedure: LEFT HEART CATH AND CORONARY ANGIOGRAPHY;  Surgeon: Iran Ouch, MD;  Location: [facility] INVASIVE CV LAB;  Service: Cardiovascular;  Laterality: N/A;   TEE WITHOUT CARDIOVERSION N/A 08/21/2014   Procedure: TRANSESOPHAGEAL ECHOCARDIOGRAM (TEE);  Surgeon: Chilton Si, MD;  Location: [facility] ENDOSCOPY;  Service: Cardiovascular;  Laterality: N/A;   TUBAL LIGATION  ~ 1978    Current Outpatient Medications  Medication Sig Dispense Refill   amiodarone (PACERONE) 200 MG tablet Take 1 tablet (200 mg total) by mouth daily. 90 tablet 1   apixaban (ELIQUIS) 5 MG TABS tablet Take 1 tablet (5 mg total) by mouth 2 (two) times daily. 180 tablet 1   atorvastatin (LIPITOR) 40 MG tablet Take 1 tablet (40 mg total) by mouth daily. 90 tablet 0   clopidogrel (PLAVIX) 75 MG tablet Take 1 tablet (75 mg total) by mouth daily. 90 tablet 0   ezetimibe (ZETIA) 10 MG tablet Take 1 tablet (10 mg total) by mouth daily. 90 tablet 0   methimazole (TAPAZOLE) 5 MG tablet Take 1 tablet (5 mg total) by mouth 3 (three) times a week. 40 tablet 3   mexiletine (MEXITIL) 200 MG capsule Take 200 mg by mouth daily.     propranolol ER (INDERAL LA) 160 MG SR capsule Take 1 capsule (160 mg total) by mouth daily. 90 capsule 0   sacubitril-valsartan (ENTRESTO) 24-26 MG Take 1 tablet by mouth 2 (two) times daily. 180 tablet 3   spironolactone (ALDACTONE) 25 MG tablet Take 0.5 tablets (12.5 mg total) by mouth daily. 45 tablet 0   torsemide (DEMADEX) 20 MG tablet Take 2 tablets (40 mg total) by mouth daily. 180 tablet 0   No current facility-administered medications for this visit.    No Known  Allergies  Review of Systems negative except from HPI and PMH  Physical Exam BP 100/66 (BP Location: Left Arm, Patient Position: Sitting, Cuff Size: Large)   Pulse 60   Ht 4\' 10"  (1.473 m)   Wt 200 lb (90.7 kg)   SpO2 98%   BMI 41.80 kg/m   Well developed and Morbidly obese  in no acute distress HENT normal Neck supple with JVP-flat Clear Device pocket well healed; without hematoma or erythema.  There is no tethering  Regular rate and rhythm, no  murmur Abd-soft with active BS No Clubbing cyanosis  edema Skin-warm and dry A & Oriented  Grossly normal sensory and motor function  ECG   sinus at 60 Interval 21/10/49 Low voltage   Assessment and  Plan Atrial fibrillation-persistent  Nonischemic cardiomyopathy  Coronary artery disease-2 vessel with recent stenting  Ventricular tachycardia-recurrent treated via device  Implantable defibrillator-[employer]  Treated hyperthyroidism/hypothryoidism  Congestive heart failure-chronic sys  Hypertransaminasemia normalized off amiodarone  PVCs  Low voltage ECG  neg PYP   Interval VT, 8/23 terminated with antitachycardia pacing.  None since.  Indeed, there has been no nonsustained VT since that visit; the abruptness is striking; I wonder whether the amiodarone has slowed ventricular tachycardia rate; hence, we have reprogrammed the monitor rate from 167 which has been a chronic setting--120.  We will continue the amiodarone at 200 mg a day and discontinue mexiletine. Moreover PVC burden is down from greater than 19/h to less than 1  Will need to check her surveillance laboratories.  We will check her thyroid functions; she is to see Dr. Daivd Council in 48 hours so these labs will be available to him.  Moreover, her last creatinine, in the ER, was elevated.  We will recheck it today.  Will also need to follow the transaminases now back on amiodarone.  She is euvolemic.  We will continue her Demadex 40 daily and spironolactone  12.5.  With her cardiomyopathy we will continue her Entresto 24/26 and spironolactone as well as her propranolol LA

## 2020-09-11 ENCOUNTER — Other Ambulatory Visit: Payer: Self-pay | Admitting: Cardiovascular Disease

## 2020-09-11 LAB — COMPREHENSIVE METABOLIC PANEL
ALT: 16 IU/L (ref 0–32)
AST: 23 IU/L (ref 0–40)
Albumin/Globulin Ratio: 1.9 (ref 1.2–2.2)
Albumin: 4.4 g/dL (ref 3.7–4.7)
Alkaline Phosphatase: 76 IU/L (ref 44–121)
BUN/Creatinine Ratio: 21 (ref 12–28)
BUN: 33 mg/dL — ABNORMAL HIGH (ref 8–27)
Bilirubin Total: 0.5 mg/dL (ref 0.0–1.2)
CO2: 23 mmol/L (ref 20–29)
Calcium: 9.2 mg/dL (ref 8.7–10.3)
Chloride: 100 mmol/L (ref 96–106)
Creatinine, Ser: 1.6 mg/dL — ABNORMAL HIGH (ref 0.57–1.00)
Globulin, Total: 2.3 g/dL (ref 1.5–4.5)
Glucose: 100 mg/dL — ABNORMAL HIGH (ref 65–99)
Potassium: 4.2 mmol/L (ref 3.5–5.2)
Sodium: 139 mmol/L (ref 134–144)
Total Protein: 6.7 g/dL (ref 6.0–8.5)
eGFR: 33 mL/min/{1.73_m2} — ABNORMAL LOW (ref >59)

## 2020-09-11 LAB — T4, FREE: Free T4: 1.21 ng/dL (ref 0.82–1.77)

## 2020-09-11 LAB — T3, FREE: T3, Free: 1.8 pg/mL — ABNORMAL LOW (ref 2.0–4.4)

## 2020-09-11 LAB — TSH: TSH: 4.39 u[IU]/mL (ref 0.450–4.500)

## 2020-09-12 ENCOUNTER — Other Ambulatory Visit: Payer: Self-pay

## 2020-09-12 ENCOUNTER — Ambulatory Visit (INDEPENDENT_AMBULATORY_CARE_PROVIDER_SITE_OTHER): Payer: Medicare Other | Admitting: Endocrinology

## 2020-09-12 VITALS — BP 110/64 | HR 67 | Ht <= 58 in | Wt 201.4 lb

## 2020-09-12 DIAGNOSIS — E0590 Thyrotoxicosis, unspecified without thyrotoxic crisis or storm: Secondary | ICD-10-CM | POA: Diagnosis not present

## 2020-09-12 NOTE — Progress Notes (Signed)
Subjective:    Patient ID: Erin [name], female    DOB: August 25, 1945, 75 y.o.   MRN: [id number]  HPI Pt returns for f/u of abnormal thyroid function (dx'ed whlie on amiodarone, which she took 2014-2020; in 2015, she had slightly elevated TSH; several measurements more recently were low; she has never had thyroid imaging; tapazole was resumed in 2018, but then stopped in late 2019, due to hypothyroidism; synthroid was started in early 2020; synthroid was stopped 12/20 due to low TSH; amiodarone was stopped in 2020; tapazole was started in preparation for DC cardioversion, which she had on 1/21; tapazole was stopped 9/21, due to hypothyroidism, and resumed 12/21).   pt states she feels well in general.  She is back on amiodarone.    Past Medical History:  Diagnosis Date   Anginal pain (HCC)    Automatic implantable cardioverter-defibrillator in situ    a. s/p MDT ICD 07/2012; b. SN # [id number]    CAD (coronary artery disease)    a. cath 2003: LM nl, mid LAD 50%, LCx nl, RCA nl b. L&RHC 08/17/2014 100% occluded mid LCx, 70% mid LAD with FFR 0.82. Medical therapy. CI 2.1. CO 4.01c. LHC 8/18: CTO LCx w/ R-L colatts, LAD s/p PCI/DES x 2   Chronic systolic CHF (congestive heart failure) (HCC)    a. echo 2014: EF 25%, diffuse HK, LA moderately dilated, RA mildly dilated, PASP 38 mm Hg; b. echo 08/2014: EF 25-30%, diffuse HK, RWMA cannot be excluded, mild MR, mild biatrial enlargement, RV mildly dilated, wall thickness nl, pacer wire/catheter noted, mild to mod TR, PASP high nl, c. TTE 8/18: EF 25-30%, diffuse HK, GR1DD, mod MR, mod dilated LA, nl RV sys fxn, PASP 56   Encounter for long-term (current) use of anticoagulants    Exertional shortness of breath    H/O medication noncompliance    High cholesterol    Hypertension    NICM (nonischemic cardiomyopathy) (HCC)    a. s/p AICD 07/22/2012   Obesity    OSA on CPAP    "suppose to; not often" (07/22/2012)   Other "heavy-for-dates" infants    PAF  (paroxysmal atrial fibrillation) (HCC)    a. on warfarin--> Eliquis 08/2014    Past Surgical History:  Procedure Laterality Date   ABDOMINAL HYSTERECTOMY  ~ 1998   "laparoscopic" (07/22/2012)   CARDIAC CATHETERIZATION  ? 1990's   CARDIAC CATHETERIZATION N/A 08/17/2014   Procedure: Right/Left Heart Cath and Coronary Angiography;  Surgeon: Kathleene Hazel, MD;  Location: [facility] INVASIVE CV LAB;  Service: Cardiovascular;  Laterality: N/A;   CARDIAC DEFIBRILLATOR PLACEMENT  07/22/2012   dual-chamber ICD.   CARDIOVERSION N/A 01/13/2019   Procedure: CARDIOVERSION;  Surgeon: Antonieta Iba, MD;  Location: [facility] ORS;  Service: Cardiovascular;  Laterality: N/A;   CORONARY STENT INTERVENTION N/A 08/24/2016   Procedure: CORONARY STENT INTERVENTION;  Surgeon: Iran Ouch, MD;  Location: [facility] INVASIVE CV LAB;  Service: Cardiovascular;  Laterality: N/A;   IMPLANTABLE CARDIOVERTER DEFIBRILLATOR IMPLANT N/A 07/22/2012   Procedure: IMPLANTABLE CARDIOVERTER DEFIBRILLATOR IMPLANT;  Surgeon: Marinus Maw, MD;  Location: [facility] CATH LAB;  Service: Cardiovascular;  Laterality: N/A;   LEFT HEART CATH AND CORONARY ANGIOGRAPHY N/A 08/24/2016   Procedure: LEFT HEART CATH AND CORONARY ANGIOGRAPHY;  Surgeon: Iran Ouch, MD;  Location: [facility] INVASIVE CV LAB;  Service: Cardiovascular;  Laterality: N/A;   TEE WITHOUT CARDIOVERSION N/A 08/21/2014   Procedure: TRANSESOPHAGEAL ECHOCARDIOGRAM (TEE);  Surgeon: Chilton Si, MD;  Location: [facility]  ENDOSCOPY;  Service: Cardiovascular;  Laterality: N/A;   TUBAL LIGATION  ~ 1978    Social History   Socioeconomic History   Marital status: Single    Spouse name: Not on file   Number of children: 3   Years of education: Not on file   Highest education level: Not on file  Occupational History   Occupation: Retired    Associate Professor: [employer]  Tobacco Use   Smoking status: Never   Smokeless tobacco: Never  Vaping Use   Vaping Use: Never used  Substance and Sexual  Activity   Alcohol use: No   Drug use: No   Sexual activity: Never  Other Topics Concern   Not on file  Social History Narrative   Not on file   Social Determinants of Health   Financial Resource Strain: Not on file  Food Insecurity: Not on file  Transportation Needs: Not on file  Physical Activity: Not on file  Stress: Not on file  Social Connections: Not on file  Intimate Partner Violence: Not on file    Current Outpatient Medications on File Prior to Visit  Medication Sig Dispense Refill   amiodarone (PACERONE) 200 MG tablet Take 1 tablet (200 mg total) by mouth daily. 90 tablet 1   apixaban (ELIQUIS) 5 MG TABS tablet Take 1 tablet (5 mg total) by mouth 2 (two) times daily. 180 tablet 1   atorvastatin (LIPITOR) 40 MG tablet TAKE 1 TABLET BY MOUTH  DAILY 90 tablet 2   clopidogrel (PLAVIX) 75 MG tablet TAKE 1 TABLET BY MOUTH  DAILY 90 tablet 2   ezetimibe (ZETIA) 10 MG tablet TAKE 1 TABLET BY MOUTH  DAILY 90 tablet 2   methimazole (TAPAZOLE) 5 MG tablet Take 1 tablet (5 mg total) by mouth 3 (three) times a week. 40 tablet 3   propranolol ER (INDERAL LA) 160 MG SR capsule TAKE 1 CAPSULE BY MOUTH  DAILY 90 capsule 2   sacubitril-valsartan (ENTRESTO) 24-26 MG Take 1 tablet by mouth 2 (two) times daily. 180 tablet 3   spironolactone (ALDACTONE) 25 MG tablet Take 0.5 tablets (12.5 mg total) by mouth daily. 45 tablet 0   torsemide (DEMADEX) 20 MG tablet Take 2 tablets (40 mg total) by mouth daily. 180 tablet 0   No current facility-administered medications on file prior to visit.    No Known Allergies  Family History  Problem Relation Age of Onset   Heart disease Mother    Heart disease Father    Lung disease Sister    Diabetes Sister    Heart failure Brother    Thyroid disease Neg Hx     BP 110/64 (BP Location: Right Arm, Patient Position: Sitting, Cuff Size: Large)   Pulse 67   Ht 4\' 10"  (1.473 m)   Wt 201 lb 6.4 oz (91.4 kg)   SpO2 94%   BMI 42.09 kg/m    Review  of Systems Denies fever.      Objective:   Physical Exam VITAL SIGNS:  See vs page GENERAL: no distress NECK: There is no palpable thyroid enlargement.  No thyroid nodule is palpable.  No palpable lymphadenopathy at the anterior neck.   Lab Results  Component Value Date   TSH 4.390 09/10/2020       Assessment & Plan:  Hyperthyroidism: well-controlled.   Patient Instructions  Please continue the same methimazole.   If ever you have fever while taking methimazole, stop it and call 11/10/2020, even if the reason is  obvious, because of the risk of a rare side-effect.   It is best to never miss the medication.  However, if you do miss it, you can make it up the next day.   Please come back for a follow-up appointment in 2 months.

## 2020-09-12 NOTE — Patient Instructions (Addendum)
Please continue the same methimazole.   If ever you have fever while taking methimazole, stop it and call us, even if the reason is obvious, because of the risk of a rare side-effect.   It is best to never miss the medication.  However, if you do miss it, you can make it up the next day.   Please come back for a follow-up appointment in 2 months.

## 2020-09-18 ENCOUNTER — Other Ambulatory Visit: Payer: Self-pay | Admitting: Cardiovascular Disease

## 2020-10-04 DIAGNOSIS — G4731 Primary central sleep apnea: Secondary | ICD-10-CM | POA: Diagnosis not present

## 2020-10-04 NOTE — Telephone Encounter (Signed)
Please place new order for AUTO BIPAP machine, pressure setting EPAP 8; IPAP 20; PS 5

## 2020-10-04 NOTE — Telephone Encounter (Signed)
Bipap has been ordered. Patient is aware and voiced her understanding.  Recall placed.  Nothing further needed at this time.

## 2020-11-15 ENCOUNTER — Ambulatory Visit: Payer: Medicare Other | Admitting: Endocrinology

## 2020-11-27 ENCOUNTER — Other Ambulatory Visit: Payer: Self-pay | Admitting: Cardiovascular Disease

## 2020-12-06 ENCOUNTER — Other Ambulatory Visit: Payer: Self-pay

## 2020-12-06 MED ORDER — APIXABAN 5 MG PO TABS: 5.0000 mg | ORAL_TABLET | Freq: Two times a day (BID) | ORAL | 3 refills | Status: DC

## 2020-12-06 NOTE — Telephone Encounter (Signed)
Prescription refill request for Eliquis received. Indication: Afib  Last office visit: 09/10/20 Graciela Husbands)  Scr: 1.60 (09/10/20)  Age: 75 Weight: 91.4kg  Appropriate dose and refill sent to requested pharmacy.

## 2020-12-11 ENCOUNTER — Ambulatory Visit (INDEPENDENT_AMBULATORY_CARE_PROVIDER_SITE_OTHER): Payer: Medicare Other | Admitting: Endocrinology

## 2020-12-11 ENCOUNTER — Other Ambulatory Visit: Payer: Self-pay

## 2020-12-11 VITALS — BP 100/50 | HR 69 | Ht <= 58 in | Wt 203.4 lb

## 2020-12-11 DIAGNOSIS — E0590 Thyrotoxicosis, unspecified without thyrotoxic crisis or storm: Secondary | ICD-10-CM

## 2020-12-11 LAB — TSH: TSH: 6.87 u[IU]/mL — ABNORMAL HIGH (ref 0.35–5.50)

## 2020-12-11 LAB — T4, FREE: Free T4: 0.85 ng/dL (ref 0.60–1.60)

## 2020-12-11 LAB — T3, FREE: T3, Free: 3.2 pg/mL (ref 2.3–4.2)

## 2020-12-11 MED ORDER — METHIMAZOLE 5 MG PO TABS: 2.5000 mg | ORAL_TABLET | ORAL | 3 refills | Status: DC

## 2020-12-11 NOTE — Patient Instructions (Addendum)
Blood tests are requested for you today.  We'll let you know about the results.  If ever you have fever while taking methimazole, stop it and call us, even if the reason is obvious, because of the risk of a rare side-effect.   It is best to never miss the medication.  However, if you do miss it, you can make it up the next day.   Please come back for a follow-up appointment in 3 months.

## 2020-12-11 NOTE — Progress Notes (Signed)
Subjective:    Patient ID: Erin [name], female    DOB: 1945/07/16, 75 y.o.   MRN: [id number]  HPI Pt returns for f/u of abnormal thyroid function (dx'ed whlie on amiodarone, which she took 2014-2020; in 2015, she had slightly elevated TSH; several measurements more recently were low; she has never had thyroid imaging; tapazole was resumed in 2018, but then stopped in late 2019, due to hypothyroidism; synthroid was started in early 2020; synthroid was stopped 12/20 due to low TSH; amiodarone was stopped in 2020; tapazole was started in preparation for DC cardioversion, which she had on 1/21; tapazole was stopped 9/21, due to hypothyroidism, and resumed 12/21; she is back on amiodarone since 2022).  pt states she feels well in general.   Past Medical History:  Diagnosis Date   Anginal pain (HCC)    Automatic implantable cardioverter-defibrillator in situ    a. s/p MDT ICD 07/2012; b. SN # [id number]    CAD (coronary artery disease)    a. cath 2003: LM nl, mid LAD 50%, LCx nl, RCA nl b. L&RHC 08/17/2014 100% occluded mid LCx, 70% mid LAD with FFR 0.82. Medical therapy. CI 2.1. CO 4.01c. LHC 8/18: CTO LCx w/ R-L colatts, LAD s/p PCI/DES x 2   Chronic systolic CHF (congestive heart failure) (HCC)    a. echo 2014: EF 25%, diffuse HK, LA moderately dilated, RA mildly dilated, PASP 38 mm Hg; b. echo 08/2014: EF 25-30%, diffuse HK, RWMA cannot be excluded, mild MR, mild biatrial enlargement, RV mildly dilated, wall thickness nl, pacer wire/catheter noted, mild to mod TR, PASP high nl, c. TTE 8/18: EF 25-30%, diffuse HK, GR1DD, mod MR, mod dilated LA, nl RV sys fxn, PASP 56   Encounter for long-term (current) use of anticoagulants    Exertional shortness of breath    H/O medication noncompliance    High cholesterol    Hypertension    NICM (nonischemic cardiomyopathy) (HCC)    a. s/p AICD 07/22/2012   Obesity    OSA on CPAP    "suppose to; not often" (07/22/2012)   Other "heavy-for-dates" infants     PAF (paroxysmal atrial fibrillation) (HCC)    a. on warfarin--> Eliquis 08/2014    Past Surgical History:  Procedure Laterality Date   ABDOMINAL HYSTERECTOMY  ~ 1998   "laparoscopic" (07/22/2012)   CARDIAC CATHETERIZATION  ? 1990's   CARDIAC CATHETERIZATION N/A 08/17/2014   Procedure: Right/Left Heart Cath and Coronary Angiography;  Surgeon: Kathleene Hazel, MD;  Location: [facility] INVASIVE CV LAB;  Service: Cardiovascular;  Laterality: N/A;   CARDIAC DEFIBRILLATOR PLACEMENT  07/22/2012   dual-chamber ICD.   CARDIOVERSION N/A 01/13/2019   Procedure: CARDIOVERSION;  Surgeon: Antonieta Iba, MD;  Location: [facility] ORS;  Service: Cardiovascular;  Laterality: N/A;   CORONARY STENT INTERVENTION N/A 08/24/2016   Procedure: CORONARY STENT INTERVENTION;  Surgeon: Iran Ouch, MD;  Location: [facility] INVASIVE CV LAB;  Service: Cardiovascular;  Laterality: N/A;   IMPLANTABLE CARDIOVERTER DEFIBRILLATOR IMPLANT N/A 07/22/2012   Procedure: IMPLANTABLE CARDIOVERTER DEFIBRILLATOR IMPLANT;  Surgeon: Marinus Maw, MD;  Location: [facility] CATH LAB;  Service: Cardiovascular;  Laterality: N/A;   LEFT HEART CATH AND CORONARY ANGIOGRAPHY N/A 08/24/2016   Procedure: LEFT HEART CATH AND CORONARY ANGIOGRAPHY;  Surgeon: Iran Ouch, MD;  Location: [facility] INVASIVE CV LAB;  Service: Cardiovascular;  Laterality: N/A;   TEE WITHOUT CARDIOVERSION N/A 08/21/2014   Procedure: TRANSESOPHAGEAL ECHOCARDIOGRAM (TEE);  Surgeon: Chilton Si, MD;  Location: [facility] ENDOSCOPY;  Service: Cardiovascular;  Laterality: N/A;   TUBAL LIGATION  ~ 1978    Social History   Socioeconomic History   Marital status: Single    Spouse name: Not on file   Number of children: 3   Years of education: Not on file   Highest education level: Not on file  Occupational History   Occupation: Retired    Associate Professor: [employer]  Tobacco Use   Smoking status: Never   Smokeless tobacco: Never  Vaping Use   Vaping Use: Never used  Substance and  Sexual Activity   Alcohol use: No   Drug use: No   Sexual activity: Never  Other Topics Concern   Not on file  Social History Narrative   Not on file   Social Determinants of Health   Financial Resource Strain: Not on file  Food Insecurity: Not on file  Transportation Needs: Not on file  Physical Activity: Not on file  Stress: Not on file  Social Connections: Not on file  Intimate Partner Violence: Not on file    Current Outpatient Medications on File Prior to Visit  Medication Sig Dispense Refill   amiodarone (PACERONE) 200 MG tablet Take 1 tablet (200 mg total) by mouth daily. 90 tablet 1   apixaban (ELIQUIS) 5 MG TABS tablet Take 1 tablet (5 mg total) by mouth 2 (two) times daily. 60 tablet 3   atorvastatin (LIPITOR) 40 MG tablet TAKE 1 TABLET BY MOUTH  DAILY 90 tablet 2   clopidogrel (PLAVIX) 75 MG tablet TAKE 1 TABLET BY MOUTH  DAILY 90 tablet 2   ezetimibe (ZETIA) 10 MG tablet TAKE 1 TABLET BY MOUTH  DAILY 90 tablet 2   propranolol ER (INDERAL LA) 160 MG SR capsule TAKE 1 CAPSULE BY MOUTH  DAILY 90 capsule 2   sacubitril-valsartan (ENTRESTO) 24-26 MG Take 1 tablet by mouth 2 (two) times daily. 180 tablet 3   spironolactone (ALDACTONE) 25 MG tablet TAKE ONE-HALF TABLET BY  MOUTH DAILY 45 tablet 3   torsemide (DEMADEX) 20 MG tablet TAKE 2 TABLETS BY MOUTH  DAILY 180 tablet 3   No current facility-administered medications on file prior to visit.    No Known Allergies  Family History  Problem Relation Age of Onset   Heart disease Mother    Heart disease Father    Lung disease Sister    Diabetes Sister    Heart failure Brother    Thyroid disease Neg Hx     BP (!) 100/50   Pulse 69   Ht 4\' 10"  (1.473 m)   Wt 203 lb 6.4 oz (92.3 kg)   SpO2 92%   BMI 42.51 kg/m    Review of Systems Denies fever and palpitations.      Objective:   Physical Exam   Lab Results  Component Value Date   TSH 6.87 (H) 12/11/2020      Assessment & Plan:  Hyperthyroidism:  overcontrolled.  reduce methimazole to 2.5, 3x per week.

## 2020-12-17 ENCOUNTER — Ambulatory Visit (INDEPENDENT_AMBULATORY_CARE_PROVIDER_SITE_OTHER): Payer: Medicare Other

## 2020-12-17 DIAGNOSIS — I428 Other cardiomyopathies: Secondary | ICD-10-CM

## 2020-12-17 LAB — CUP PACEART REMOTE DEVICE CHECK
Battery Remaining Longevity: 17 mo
Battery Voltage: 2.9 V
Brady Statistic AP VP Percent: 0 %
Brady Statistic AP VS Percent: 1.34 %
Brady Statistic AS VP Percent: 0.03 %
Brady Statistic AS VS Percent: 98.62 %
Brady Statistic RA Percent Paced: 1.34 %
Brady Statistic RV Percent Paced: 0.04 %
Date Time Interrogation Session: 20221213130003
HighPow Impedance: 83 Ohm
Implantable Lead Implant Date: 20140718
Implantable Lead Implant Date: 20140718
Implantable Lead Location: 753859
Implantable Lead Location: 753860
Implantable Lead Model: 5076
Implantable Lead Model: 6935
Implantable Pulse Generator Implant Date: 20140718
Lead Channel Impedance Value: 304 Ohm
Lead Channel Impedance Value: 399 Ohm
Lead Channel Impedance Value: 437 Ohm
Lead Channel Pacing Threshold Amplitude: 0.5 V
Lead Channel Pacing Threshold Amplitude: 0.625 V
Lead Channel Pacing Threshold Pulse Width: 0.4 ms
Lead Channel Pacing Threshold Pulse Width: 0.4 ms
Lead Channel Sensing Intrinsic Amplitude: 15.25 mV
Lead Channel Sensing Intrinsic Amplitude: 15.25 mV
Lead Channel Sensing Intrinsic Amplitude: 4 mV
Lead Channel Sensing Intrinsic Amplitude: 4 mV
Lead Channel Setting Pacing Amplitude: 2 V
Lead Channel Setting Pacing Amplitude: 2.5 V
Lead Channel Setting Pacing Pulse Width: 0.4 ms
Lead Channel Setting Sensing Sensitivity: 0.3 mV

## 2020-12-27 NOTE — Progress Notes (Signed)
Remote ICD transmission.   

## 2020-12-31 ENCOUNTER — Ambulatory Visit (INDEPENDENT_AMBULATORY_CARE_PROVIDER_SITE_OTHER): Payer: Medicare Other | Admitting: Internal Medicine

## 2020-12-31 ENCOUNTER — Encounter: Payer: Self-pay | Admitting: Internal Medicine

## 2020-12-31 ENCOUNTER — Other Ambulatory Visit: Payer: Self-pay

## 2020-12-31 VITALS — BP 138/74 | HR 68 | Ht <= 58 in | Wt 207.4 lb

## 2020-12-31 DIAGNOSIS — I428 Other cardiomyopathies: Secondary | ICD-10-CM | POA: Diagnosis not present

## 2020-12-31 DIAGNOSIS — I4819 Other persistent atrial fibrillation: Secondary | ICD-10-CM | POA: Diagnosis not present

## 2020-12-31 DIAGNOSIS — I5022 Chronic systolic (congestive) heart failure: Secondary | ICD-10-CM | POA: Diagnosis not present

## 2020-12-31 DIAGNOSIS — I472 Ventricular tachycardia, unspecified: Secondary | ICD-10-CM

## 2020-12-31 DIAGNOSIS — Z95810 Presence of automatic (implantable) cardiac defibrillator: Secondary | ICD-10-CM

## 2020-12-31 DIAGNOSIS — Z79899 Other long term (current) drug therapy: Secondary | ICD-10-CM

## 2020-12-31 NOTE — Progress Notes (Signed)
Patient Care Team: Patient, No Pcp Per (Inactive) as PCP - General (General Practice) Duke Salvia, MD as PCP - Electrophysiology (Cardiology) Delma Freeze, FNP as Nurse Practitioner (Cardiology) Duke Salvia, MD as Consulting Physician (Cardiology) Antonieta Iba, MD as Consulting Physician (Cardiology)   HPI  Erin [name] is a 75 y.o. female seen in followup for ICD implanted in the context of a nonischemic and ischemic cardiomyopathy and VT; Hx of afib- persistent. Stenting x2 on Clopidigrel and anticoagulation w apixoban.   VT previously managed with amio, but stopped 2/2 hyperthyroidism, Rx w mex alone, then resumed Amio 2/2 increasing VT burden ( discussed with Dr Lorel Monaco -Endo)  TSH noted 12/22 to be high, and methimazole redosed per SE   The patient denies chest pain, nocturnal dyspnea, orthopnea or peripheral edema.  There have been no palpitations, lightheadedness or syncope.  Dyspnea with moderate exertion but stable  Patient denies symptoms of GI intolerance, sun sensitivity, neurological symptoms attributable to amiodarone.     8/23--ICD alarm.  There was appropriate ATP.  Apparently a question about high RV threshold although the report, RD-PA, "no evidence of RV lead failure "  Has recently moved to [facility]          Date Cr Hgb TSH ALT  8/18   4.9 16  11/18    2.13   12/19  11.3 26>>6.04 (2/20)   5/20 1.35    56  7/20 1.49  0.68 52  12/20 1.09 10.5 0.006 11  3/21   0.02<<0.01   5/21  12.6 (7/21) 2.07 19  12/21 1.17  4.46<< 75   6/22 1.29  6.08   8/22 1.83     9/22 1.6 4.2 6.84 (12/22)<<4.39 16    DATE TEST EF   5/14 Echo  20-25%   8/16 Echo  25 % (44/2.1/35)m LAE  8/18 Echo  20-25%   8/18 Cath  T Cx///90% LAD>>DES  11/18 Echo   25-30%   6/21 PYP  Ratio 1 equivocal    PVCs  2.5 /Hr     Thromboembolic risk factors ( age -53 , HTN-1, Vasc disease -1, CHF-1, Gender-1) for a CHADSVASc Score of >=6  Past Medical History:  Diagnosis Date    Anginal pain (HCC)    Automatic implantable cardioverter-defibrillator in situ    a. s/p MDT ICD 07/2012; b. SN # [id number]    CAD (coronary artery disease)    a. cath 2003: LM nl, mid LAD 50%, LCx nl, RCA nl b. L&RHC 08/17/2014 100% occluded mid LCx, 70% mid LAD with FFR 0.82. Medical therapy. CI 2.1. CO 4.01c. LHC 8/18: CTO LCx w/ R-L colatts, LAD s/p PCI/DES x 2   Chronic systolic CHF (congestive heart failure) (HCC)    a. echo 2014: EF 25%, diffuse HK, LA moderately dilated, RA mildly dilated, PASP 38 mm Hg; b. echo 08/2014: EF 25-30%, diffuse HK, RWMA cannot be excluded, mild MR, mild biatrial enlargement, RV mildly dilated, wall thickness nl, pacer wire/catheter noted, mild to mod TR, PASP high nl, c. TTE 8/18: EF 25-30%, diffuse HK, GR1DD, mod MR, mod dilated LA, nl RV sys fxn, PASP 56   Encounter for long-term (current) use of anticoagulants    Exertional shortness of breath    H/O medication noncompliance    High cholesterol    Hypertension    NICM (nonischemic cardiomyopathy) (HCC)    a. s/p AICD 07/22/2012   Obesity    OSA  on CPAP    "suppose to; not often" (07/22/2012)   Other "heavy-for-dates" infants    PAF (paroxysmal atrial fibrillation) (HCC)    a. on warfarin--> Eliquis 08/2014    Past Surgical History:  Procedure Laterality Date   ABDOMINAL HYSTERECTOMY  ~ 1998   "laparoscopic" (07/22/2012)   CARDIAC CATHETERIZATION  ? 1990's   CARDIAC CATHETERIZATION N/A 08/17/2014   Procedure: Right/Left Heart Cath and Coronary Angiography;  Surgeon: Kathleene Hazel, MD;  Location: [facility] INVASIVE CV LAB;  Service: Cardiovascular;  Laterality: N/A;   CARDIAC DEFIBRILLATOR PLACEMENT  07/22/2012   dual-chamber ICD.   CARDIOVERSION N/A 01/13/2019   Procedure: CARDIOVERSION;  Surgeon: Antonieta Iba, MD;  Location: [facility] ORS;  Service: Cardiovascular;  Laterality: N/A;   CORONARY STENT INTERVENTION N/A 08/24/2016   Procedure: CORONARY STENT INTERVENTION;  Surgeon: Iran Ouch,  MD;  Location: [facility] INVASIVE CV LAB;  Service: Cardiovascular;  Laterality: N/A;   IMPLANTABLE CARDIOVERTER DEFIBRILLATOR IMPLANT N/A 07/22/2012   Procedure: IMPLANTABLE CARDIOVERTER DEFIBRILLATOR IMPLANT;  Surgeon: Marinus Maw, MD;  Location: [facility] CATH LAB;  Service: Cardiovascular;  Laterality: N/A;   LEFT HEART CATH AND CORONARY ANGIOGRAPHY N/A 08/24/2016   Procedure: LEFT HEART CATH AND CORONARY ANGIOGRAPHY;  Surgeon: Iran Ouch, MD;  Location: [facility] INVASIVE CV LAB;  Service: Cardiovascular;  Laterality: N/A;   TEE WITHOUT CARDIOVERSION N/A 08/21/2014   Procedure: TRANSESOPHAGEAL ECHOCARDIOGRAM (TEE);  Surgeon: Chilton Si, MD;  Location: [facility] ENDOSCOPY;  Service: Cardiovascular;  Laterality: N/A;   TUBAL LIGATION  ~ 1978    Current Outpatient Medications  Medication Sig Dispense Refill   amiodarone (PACERONE) 200 MG tablet Take 1 tablet (200 mg total) by mouth daily. 90 tablet 1   apixaban (ELIQUIS) 5 MG TABS tablet Take 1 tablet (5 mg total) by mouth 2 (two) times daily. 60 tablet 3   atorvastatin (LIPITOR) 40 MG tablet TAKE 1 TABLET BY MOUTH  DAILY 90 tablet 2   clopidogrel (PLAVIX) 75 MG tablet TAKE 1 TABLET BY MOUTH  DAILY 90 tablet 2   ezetimibe (ZETIA) 10 MG tablet TAKE 1 TABLET BY MOUTH  DAILY 90 tablet 2   methimazole (TAPAZOLE) 5 MG tablet Take 0.5 tablets (2.5 mg total) by mouth 3 (three) times a week. 20 tablet 3   propranolol ER (INDERAL LA) 160 MG SR capsule TAKE 1 CAPSULE BY MOUTH  DAILY 90 capsule 2   sacubitril-valsartan (ENTRESTO) 24-26 MG Take 1 tablet by mouth 2 (two) times daily. 180 tablet 3   spironolactone (ALDACTONE) 25 MG tablet TAKE ONE-HALF TABLET BY  MOUTH DAILY 45 tablet 3   torsemide (DEMADEX) 20 MG tablet TAKE 2 TABLETS BY MOUTH  DAILY 180 tablet 3   No current facility-administered medications for this visit.    No Known Allergies  Review of Systems negative except from HPI and PMH  Physical Exam BP 138/74    Pulse 68    Ht 4\' 10"  (1.473 m)     Wt 207 lb 6.4 oz (94.1 kg)    SpO2 93%    BMI 43.35 kg/m   Well developed and well nourished in no acute distress HENT normal Neck supple with JVP-flat Clear Device pocket well healed; without hematoma or erythema.  There is no tethering  Regular rate and rhythm, no murmur Abd-soft with active BS No Clubbing cyanosis  edema Skin-warm and dry A & Oriented  Grossly normal sensory and motor function  ECG sinus @ 68     Assessment and  Plan Atrial fibrillation-persistent  Nonischemic cardiomyopathy  Coronary artery disease-2 vessel with recent stenting  Ventricular tachycardia-recurrent treated via device  Implantable defibrillator-[employer]onic  Treated hyperthyroidism/hypothryoidism  Congestive heart failure-chronic sys  Hypertransaminasemia normalized off amiodarone-- normal on resumed amio   PVCs  Low voltage ECG  neg PYP   No interval VT/NSVT  continue amio 200 mg daily-- hopefully can reduce dose in spring ;  some VTNS    No intercurrent atrial fibrillation or flutter; continue Apixoban 2x daily mg; surveillance labs are (As above). Hypothyroidism, on amio followed by [facility]-Endo  Pt heart failure status is stable. Continue demadex 40mg  and aldactone 12.5  mg.  Electrolytes are stable.  Continue entresto 24-246 and propranolol 160 LA, chosen for her because of his late sodium channel blocking properties as an alternative to carvedilol for rhythm control  Without symptoms of ischemia.  Continue ezetimibe 10 and atorvastatin 40, Plavix 75  Does not record blood pressure at home  Transaminases have been normal with the reintroduction of amiodarone

## 2020-12-31 NOTE — Patient Instructions (Addendum)
Medication Instructions:   Your physician recommends that you continue on your current medications as directed. Please refer to the Current Medication list given to you today.  *If you need a refill on your cardiac medications before your next appointment, please call your pharmacy*   Lab Work:  None ordered  Testing/Procedures:  None ordered   Follow-Up: At [facility], you and your health needs are our priority.  As part of our continuing mission to provide you with exceptional heart care, we have created designated Provider Care Teams.  These Care Teams include your primary Cardiologist (physician) and Advanced Practice Providers (APPs -  Physician Assistants and Nurse Practitioners) who all work together to provide you with the care you need, when you need it.  We recommend signing up for the patient portal called "MyChart".  Sign up information is provided on this After Visit Summary.  MyChart is used to connect with patients for Virtual Visits (Telemedicine).  Patients are able to view lab/test results, encounter notes, upcoming appointments, etc.  Non-urgent messages can be sent to your provider as well.   To learn more about what you can do with MyChart, go to [email or web address].    Your next appointment:    6 month(s) in [place]  The format for your next appointment:   In Person  Provider:    Dr. Graciela Husbands

## 2021-01-15 DIAGNOSIS — G4733 Obstructive sleep apnea (adult) (pediatric): Secondary | ICD-10-CM | POA: Diagnosis not present

## 2021-02-15 DIAGNOSIS — G4733 Obstructive sleep apnea (adult) (pediatric): Secondary | ICD-10-CM | POA: Diagnosis not present

## 2021-02-24 ENCOUNTER — Other Ambulatory Visit: Payer: Self-pay

## 2021-02-24 ENCOUNTER — Ambulatory Visit (INDEPENDENT_AMBULATORY_CARE_PROVIDER_SITE_OTHER): Payer: Medicare Other | Admitting: Primary Care

## 2021-02-24 ENCOUNTER — Encounter: Payer: Self-pay | Admitting: Primary Care

## 2021-02-24 VITALS — BP 128/60 | HR 70 | Temp 97.9°F | Ht <= 58 in | Wt 210.6 lb

## 2021-02-24 DIAGNOSIS — G4733 Obstructive sleep apnea (adult) (pediatric): Secondary | ICD-10-CM | POA: Diagnosis not present

## 2021-02-24 DIAGNOSIS — G4731 Primary central sleep apnea: Secondary | ICD-10-CM | POA: Diagnosis not present

## 2021-02-24 NOTE — Progress Notes (Signed)
@Patient  ID: Erin [name], female    DOB: 1945-11-28, 76 y.o.   MRN: [id number]  Chief Complaint  Patient presents with   Follow-up    Bipap compliance    Referring provider: No ref. provider found  HPI: 76 year old female, never smoked.  Past medical history significant for complex sleep apnea syndrome, A. fib, chronic systolic heart failure, hypertension, atherosclerosis, nonischemic cardiomyopathy, pulmonary hypertension, hyperthyroidism, chronic kidney disease stage III.  Former patient of Dr. Gwenette Greet, last seen in office on 07/18/2013. NPSG 2006, AHI 77/hr.   Previous [facility] pulmonary encounter:  07/31/2020 Patient presents today for overdue follow-up for severe complex sleep apnea. She is not currently wearing BIPAP machine, she states that her machine stopped working a month or so ago. Last compliance reports is from April 2022 which showed 37% compliance. Pressure setting 14/10 with residual AHI 26. She has been sleeping in recliner chair. She reports loud snoring and daytime fatigue. She goes to bed around 12:30am. It takes her <30 mins to fall asleep. She wakes up 1-2 times at night. She gets out of bed 6:30am. [place] 2.  Airview download 03/14/20-04/12/20: Usage 11/20 days; 37% > 4 hours Average usage (days used ) 5 hours 29 mins  Pressure IPAP 14/ EPAP 10cm h20  Leaks 22.6L/min  AHI 28.6  02/24/2021- Interim hx  Patient presents today for 6-8 month follow-up. During our last visit patient was not consistently using her BIPAP machine as it was broken. She underwent an unsuccessful titration study in August 2022, central apneas persistent on 18/13. Suggested patient be tried on autobilevel EPAP 8, PS 5, IPAP max 20. If centrals persistent on BIPAP may be limited since LVEF is low, ASV mode contraindicated.   She is doing well, no acute complaints. She is currently using PAP machine in CPAP mode and not BIPAP. No issues with mask fit. Pressure set at 14cm h20. She states that she is  having some bad readings. She is sleeping ok. She gets 7-9 hours of sleep a night. No residual daytime sleepiness.   IcodeConnent download 01/15/21-04/14/21 Usage 39 days  Average usage days used 8 hours 46 mins Average pressure 14cm h20 Average leak 5.2L/min Average AHI 6.6  Average CAI 3.3    No Known Allergies  Immunization History  Administered Date(s) Administered   Influenza,inj,Quad PF,6+ Mos 12/04/2016, 12/07/2017   Influenza,inj,quad, With Preservative 10/05/2017    Past Medical History:  Diagnosis Date   Anginal pain ([place])    Automatic implantable cardioverter-defibrillator in situ    a. s/p MDT ICD 07/2012; b. SN # PJN [id number]    CAD (coronary artery disease)    a. cath 2003: LM nl, mid LAD 50%, LCx nl, RCA nl b. L&RHC 08/17/2014 100% occluded mid LCx, 70% mid LAD with FFR 0.82. Medical therapy. CI 2.1. CO 4.01c. LHC 8/18: CTO LCx w/ R-L colatts, LAD s/p PCI/DES x 2   Chronic systolic CHF (congestive heart failure) ([place])    a. echo 2014: EF 25%, diffuse HK, LA moderately dilated, RA mildly dilated, PASP 38 mm Hg; b. echo 08/2014: EF 25-30%, diffuse HK, RWMA cannot be excluded, mild MR, mild biatrial enlargement, RV mildly dilated, wall thickness nl, pacer wire/catheter noted, mild to mod TR, PASP high nl, c. TTE 8/18: EF 25-30%, diffuse HK, GR1DD, mod MR, mod dilated LA, nl RV sys fxn, PASP 56   Encounter for long-term (current) use of anticoagulants    Exertional shortness of breath    H/O medication noncompliance  High cholesterol    Hypertension    NICM (nonischemic cardiomyopathy) ([place])    a. s/p AICD 07/22/2012   Obesity    OSA on CPAP    "suppose to; not often" (07/22/2012)   Other "heavy-for-dates" infants    PAF (paroxysmal atrial fibrillation) ([place])    a. on warfarin--> Eliquis 08/2014    Tobacco History: Social History   Tobacco Use  Smoking Status Never  Smokeless Tobacco Never   Counseling given: Not Answered   Outpatient Medications Prior to  Visit  Medication Sig Dispense Refill   amiodarone (PACERONE) 200 MG tablet Take 1 tablet (200 mg total) by mouth daily. 90 tablet 1   apixaban (ELIQUIS) 5 MG TABS tablet Take 1 tablet (5 mg total) by mouth 2 (two) times daily. 60 tablet 3   atorvastatin (LIPITOR) 40 MG tablet TAKE 1 TABLET BY MOUTH  DAILY 90 tablet 2   clopidogrel (PLAVIX) 75 MG tablet TAKE 1 TABLET BY MOUTH  DAILY 90 tablet 2   ezetimibe (ZETIA) 10 MG tablet TAKE 1 TABLET BY MOUTH  DAILY 90 tablet 2   methimazole (TAPAZOLE) 5 MG tablet Take 0.5 tablets (2.5 mg total) by mouth 3 (three) times a week. 20 tablet 3   propranolol ER (INDERAL LA) 160 MG SR capsule TAKE 1 CAPSULE BY MOUTH  DAILY 90 capsule 2   sacubitril-valsartan (ENTRESTO) 24-26 MG Take 1 tablet by mouth 2 (two) times daily. 180 tablet 3   spironolactone (ALDACTONE) 25 MG tablet TAKE ONE-HALF TABLET BY  MOUTH DAILY 45 tablet 3   torsemide (DEMADEX) 20 MG tablet TAKE 2 TABLETS BY MOUTH  DAILY 180 tablet 3   No facility-administered medications prior to visit.    Review of Systems  Review of Systems  Constitutional: Negative.   Respiratory: Negative.    Cardiovascular: Negative.   Psychiatric/Behavioral: Negative.      Physical Exam  BP 128/60 (BP Location: Right Arm, Cuff Size: Normal)    Pulse 70    Temp 97.9 F (36.6 C) (Oral)    Ht 4\' 10"  (1.473 m)    Wt 210 lb 9.6 oz (95.5 kg)    SpO2 95%    BMI 44.02 kg/m  Physical Exam Constitutional:      Appearance: Normal appearance.  HENT:     Head: Normocephalic and atraumatic.     Mouth/Throat:     Mouth: Mucous membranes are moist.     Pharynx: Oropharynx is clear.  Cardiovascular:     Rate and Rhythm: Normal rate and regular rhythm.  Pulmonary:     Effort: Pulmonary effort is normal.     Breath sounds: Normal breath sounds.  Musculoskeletal:        General: Normal range of motion.     Cervical back: Normal range of motion and neck supple.  Skin:    General: Skin is warm and dry.   Neurological:     General: No focal deficit present.     Mental Status: She is alert and oriented to person, place, and time. Mental status is at baseline.  Psychiatric:        Mood and Affect: Mood normal.        Behavior: Behavior normal.        Thought Content: Thought content normal.        Judgment: Judgment normal.     Lab Results:  CBC    Component Value Date/Time   WBC 7.2 08/27/2020 0410   RBC 4.76 08/27/2020 0410  HGB 13.5 08/27/2020 0410   HGB 10.8 (L) 12/13/2018 1241   HCT 39.9 08/27/2020 0410   HCT 33.5 (L) 12/13/2018 1241   PLT 278 08/27/2020 0410   PLT 324 12/13/2018 1241   MCV 83.8 08/27/2020 0410   MCV 76 (L) 12/13/2018 1241   MCH 28.4 08/27/2020 0410   MCHC 33.8 08/27/2020 0410   RDW 14.3 08/27/2020 0410   RDW 15.1 12/13/2018 1241   LYMPHSABS 2.4 08/27/2020 0410   LYMPHSABS 2.8 12/13/2018 1241   MONOABS 1.1 (H) 08/27/2020 0410   EOSABS 0.4 08/27/2020 0410   EOSABS 0.3 12/13/2018 1241   BASOSABS 0.1 08/27/2020 0410   BASOSABS 0.1 12/13/2018 1241    BMET    Component Value Date/Time   NA 139 09/10/2020 1005   K 4.2 09/10/2020 1005   CL 100 09/10/2020 1005   CO2 23 09/10/2020 1005   GLUCOSE 100 (H) 09/10/2020 1005   GLUCOSE 103 (H) 08/27/2020 0410   BUN 33 (H) 09/10/2020 1005   CREATININE 1.60 (H) 09/10/2020 1005   CALCIUM 9.2 09/10/2020 1005   GFRNONAA 28 (L) 08/27/2020 0410   GFRAA 53 (L) 12/06/2019 1112    BNP    Component Value Date/Time   BNP 322.1 (H) 08/27/2020 0410    ProBNP    Component Value Date/Time   PROBNP 559.0 (H) 05/02/2012 1633    Imaging: No results found.   Assessment & Plan:   Complex sleep apnea syndrome - NPSG 2006>> AHI 77/hjr with complex apnea - Titration 2007>> failed CPAP, bilevel 22/17 (best balance for control of obstructive and central apneas) - Titration August 2022 >>  Unsuccessful, central apneas persistent on 18/13. Suggested patient be tried on autobilevel EPAP 8, PS 5, IPAP max 20. If  centrals persistent on BIPAP may be limited since LVEF is low, ASV mode contraindicated. - Change BIPAP mode/ pressure EPAP 8, IPAP max 20, pressure support 5  Patient is currently on CPAP mode on PAP device, pressure set at 14cm. Average AHI 6.6/ Average CAI 3.3. Placing an order to change back to BIPAP mode/ pressure EPAP 8, IPAP max 20, pressure support 5. Continue to encourage compliance with bipap. Recommend patient work on weight loss. Advised against driving if experiencing excessive daytime sleepiness. Follow-up 3-6 months with Dr. Halford Chessman (30 min- new patient)     Martyn Ehrich, NP 02/25/2021

## 2021-02-24 NOTE — Patient Instructions (Addendum)
Orders: Change BIPAP mode/ pressure EPAP 8, IPAP max 20, pressure support 5  Follow-up: 3-6 months with Dr. Craige Cotta (30 min- new patient)

## 2021-02-25 NOTE — Assessment & Plan Note (Signed)
-   NPSG 2006>> AHI 77/hjr with complex apnea - Titration 2007>> failed CPAP, bilevel 22/17 (best balance for control of obstructive and central apneas) - Titration August 2022 >>  Unsuccessful, central apneas persistent on 18/13. Suggested patient be tried on autobilevel EPAP 8, PS 5, IPAP max 20. If centrals persistent on BIPAP may be limited since LVEF is low, ASV mode contraindicated. - Change BIPAP mode/ pressure EPAP 8, IPAP max 20, pressure support 5  Patient is currently on CPAP mode on PAP device, pressure set at 14cm. Average AHI 6.6/ Average CAI 3.3. Placing an order to change back to BIPAP mode/ pressure EPAP 8, IPAP max 20, pressure support 5. Continue to encourage compliance with bipap. Recommend patient work on weight loss. Advised against driving if experiencing excessive daytime sleepiness. Follow-up 3-6 months with Dr. Craige Cotta (30 min- new patient)

## 2021-02-25 NOTE — Progress Notes (Signed)
Reviewed and agree with assessment/plan.   Coralyn Helling, MD [facility] Pulmonary/Critical Care 02/25/2021, 8:52 AM Pager:  [phone]

## 2021-02-26 ENCOUNTER — Telehealth: Payer: Self-pay | Admitting: Primary Care

## 2021-02-27 NOTE — Telephone Encounter (Signed)
Called and spoke to [place] from [email or web address] that bipap order was just a setting change nothing further needed.

## 2021-03-12 ENCOUNTER — Ambulatory Visit (INDEPENDENT_AMBULATORY_CARE_PROVIDER_SITE_OTHER): Payer: Medicare Other | Admitting: Endocrinology

## 2021-03-12 ENCOUNTER — Other Ambulatory Visit: Payer: Self-pay

## 2021-03-12 VITALS — BP 108/50 | HR 71 | Ht <= 58 in | Wt 212.0 lb

## 2021-03-12 DIAGNOSIS — E0590 Thyrotoxicosis, unspecified without thyrotoxic crisis or storm: Secondary | ICD-10-CM

## 2021-03-12 LAB — T4, FREE: Free T4: 0.81 ng/dL (ref 0.60–1.60)

## 2021-03-12 LAB — TSH: TSH: 14.43 u[IU]/mL — ABNORMAL HIGH (ref 0.35–5.50)

## 2021-03-12 NOTE — Progress Notes (Signed)
? ?Subjective:  ? ? Patient ID: Erin [name], female    DOB: 1945/11/07, 76 y.o.   MRN: [id number] ? ?HPI ?Pt returns for f/u of abnormal thyroid function (dx'ed whlie on amiodarone, which she took 2014-2020; in 2015, she had slightly elevated TSH; several measurements more recently were low; she has never had thyroid imaging; tapazole was resumed in 2018, but then stopped in late 2019, due to hypothyroidism; synthroid was started in early 2020; synthroid was stopped 12/20 due to low TSH; amiodarone was stopped in 2020; tapazole was started in preparation for DC cardioversion, which she had on 1/21; tapazole was stopped 9/21, due to hypothyroidism, and resumed 12/21; she is back on amiodarone since 2022).  pt states she feels well in general.   ?Past Medical History:  ?Diagnosis Date  ? Anginal pain (HCC)   ? Automatic implantable cardioverter-defibrillator in situ   ? a. s/p MDT ICD 07/2012; b. SN # [id number]   ? CAD (coronary artery disease)   ? a. cath 2003: LM nl, mid LAD 50%, LCx nl, RCA nl b. L&RHC 08/17/2014 100% occluded mid LCx, 70% mid LAD with FFR 0.82. Medical therapy. CI 2.1. CO 4.01c. LHC 8/18: CTO LCx w/ R-L colatts, LAD s/p PCI/DES x 2  ? Chronic systolic CHF (congestive heart failure) (HCC)   ? a. echo 2014: EF 25%, diffuse HK, LA moderately dilated, RA mildly dilated, PASP 38 mm Hg; b. echo 08/2014: EF 25-30%, diffuse HK, RWMA cannot be excluded, mild MR, mild biatrial enlargement, RV mildly dilated, wall thickness nl, pacer wire/catheter noted, mild to mod TR, PASP high nl, c. TTE 8/18: EF 25-30%, diffuse HK, GR1DD, mod MR, mod dilated LA, nl RV sys fxn, PASP 56  ? Encounter for long-term (current) use of anticoagulants   ? Exertional shortness of breath   ? H/O medication noncompliance   ? High cholesterol   ? Hypertension   ? NICM (nonischemic cardiomyopathy) (HCC)   ? a. s/p AICD 07/22/2012  ? Obesity   ? OSA on CPAP   ? "suppose to; not often" (07/22/2012)  ? Other "heavy-for-dates" infants   ?  PAF (paroxysmal atrial fibrillation) (HCC)   ? a. on warfarin--> Eliquis 08/2014  ? ? ?Past Surgical History:  ?Procedure Laterality Date  ? ABDOMINAL HYSTERECTOMY  ~ 1998  ? "laparoscopic" (07/22/2012)  ? CARDIAC CATHETERIZATION  ? 1990's  ? CARDIAC CATHETERIZATION N/A 08/17/2014  ? Procedure: Right/Left Heart Cath and Coronary Angiography;  Surgeon: Kathleene Hazel, MD;  Location: [facility] INVASIVE CV LAB;  Service: Cardiovascular;  Laterality: N/A;  ? CARDIAC DEFIBRILLATOR PLACEMENT  07/22/2012  ? dual-chamber ICD.  ? CARDIOVERSION N/A 01/13/2019  ? Procedure: CARDIOVERSION;  Surgeon: Antonieta Iba, MD;  Location: [facility] ORS;  Service: Cardiovascular;  Laterality: N/A;  ? CORONARY STENT INTERVENTION N/A 08/24/2016  ? Procedure: CORONARY STENT INTERVENTION;  Surgeon: Iran Ouch, MD;  Location: [facility] INVASIVE CV LAB;  Service: Cardiovascular;  Laterality: N/A;  ? IMPLANTABLE CARDIOVERTER DEFIBRILLATOR IMPLANT N/A 07/22/2012  ? Procedure: IMPLANTABLE CARDIOVERTER DEFIBRILLATOR IMPLANT;  Surgeon: Marinus Maw, MD;  Location: [facility] CATH LAB;  Service: Cardiovascular;  Laterality: N/A;  ? LEFT HEART CATH AND CORONARY ANGIOGRAPHY N/A 08/24/2016  ? Procedure: LEFT HEART CATH AND CORONARY ANGIOGRAPHY;  Surgeon: Iran Ouch, MD;  Location: [facility] INVASIVE CV LAB;  Service: Cardiovascular;  Laterality: N/A;  ? TEE WITHOUT CARDIOVERSION N/A 08/21/2014  ? Procedure: TRANSESOPHAGEAL ECHOCARDIOGRAM (TEE);  Surgeon: Chilton Si, MD;  Location: [facility] ENDOSCOPY;  Service: Cardiovascular;  Laterality: N/A;  ? TUBAL LIGATION  ~ 1978  ? ? ?Social History  ? ?Socioeconomic History  ? Marital status: Single  ?  Spouse name: Not on file  ? Number of children: 3  ? Years of education: Not on file  ? Highest education level: Not on file  ?Occupational History  ? Occupation: Retired  ?  Employer: [employer]  ?Tobacco Use  ? Smoking status: Never  ? Smokeless tobacco: Never  ?Vaping Use  ? Vaping Use: Never used  ?Substance and  Sexual Activity  ? Alcohol use: No  ? Drug use: No  ? Sexual activity: Never  ?Other Topics Concern  ? Not on file  ?Social History Narrative  ? Not on file  ? ?Social Determinants of Health  ? ?Financial Resource Strain: Not on file  ?Food Insecurity: Not on file  ?Transportation Needs: Not on file  ?Physical Activity: Not on file  ?Stress: Not on file  ?Social Connections: Not on file  ?Intimate Partner Violence: Not on file  ? ? ?Current Outpatient Medications on File Prior to Visit  ?Medication Sig Dispense Refill  ? amiodarone (PACERONE) 200 MG tablet Take 1 tablet (200 mg total) by mouth daily. 90 tablet 1  ? apixaban (ELIQUIS) 5 MG TABS tablet Take 1 tablet (5 mg total) by mouth 2 (two) times daily. 60 tablet 3  ? atorvastatin (LIPITOR) 40 MG tablet TAKE 1 TABLET BY MOUTH  DAILY 90 tablet 2  ? clopidogrel (PLAVIX) 75 MG tablet TAKE 1 TABLET BY MOUTH  DAILY 90 tablet 2  ? ezetimibe (ZETIA) 10 MG tablet TAKE 1 TABLET BY MOUTH  DAILY 90 tablet 2  ? methimazole (TAPAZOLE) 5 MG tablet Take 0.5 tablets (2.5 mg total) by mouth 3 (three) times a week. 20 tablet 3  ? propranolol ER (INDERAL LA) 160 MG SR capsule TAKE 1 CAPSULE BY MOUTH  DAILY 90 capsule 2  ? sacubitril-valsartan (ENTRESTO) 24-26 MG Take 1 tablet by mouth 2 (two) times daily. 180 tablet 3  ? spironolactone (ALDACTONE) 25 MG tablet TAKE ONE-HALF TABLET BY  MOUTH DAILY 45 tablet 3  ? torsemide (DEMADEX) 20 MG tablet TAKE 2 TABLETS BY MOUTH  DAILY 180 tablet 3  ? ?No current facility-administered medications on file prior to visit.  ? ? ?No Known Allergies ? ?Family History  ?Problem Relation Age of Onset  ? Heart disease Mother   ? Heart disease Father   ? Lung disease Sister   ? Diabetes Sister   ? Heart failure Brother   ? Thyroid disease Neg Hx   ? ? ?BP (!) 108/50   Pulse 71   Ht 4\' 10"  (1.473 m)   Wt 212 lb (96.2 kg)   SpO2 92%   BMI 44.31 kg/m?  ? ? ?Review of Systems ?Denies fever.   ?   ?Objective:  ? Physical Exam ?VITAL SIGNS:  See vs  page.   ?GENERAL: no distress.   ?NECK: thyroid seems enlarged, but I can't tell details.   ? ? ?Lab Results  ?Component Value Date  ? TSH 14.43 (H) 03/12/2021  ? ?   ?Assessment & Plan:  ?Hyperthyroidism: overcontrolled.  Please stop taking the methimazole.  Please come back for a follow-up appointment in 1 month. ? ?

## 2021-03-12 NOTE — Patient Instructions (Addendum)
Blood tests are requested for you today.  We'll let you know about the results.   ?If ever you have fever while taking methimazole, stop it and call us, even if the reason is obvious, because of the risk of a rare side-effect.   ?It is best to never miss the medication.  However, if you do miss it, you can make it up the next day.   ?Please come back for a follow-up appointment in May.   ?

## 2021-03-15 DIAGNOSIS — G4733 Obstructive sleep apnea (adult) (pediatric): Secondary | ICD-10-CM | POA: Diagnosis not present

## 2021-03-18 ENCOUNTER — Ambulatory Visit (INDEPENDENT_AMBULATORY_CARE_PROVIDER_SITE_OTHER): Payer: Medicare Other

## 2021-03-18 DIAGNOSIS — I428 Other cardiomyopathies: Secondary | ICD-10-CM

## 2021-03-18 LAB — CUP PACEART REMOTE DEVICE CHECK
Battery Remaining Longevity: 14 mo
Battery Voltage: 2.88 V
Brady Statistic AP VP Percent: 0 %
Brady Statistic AP VS Percent: 0.41 %
Brady Statistic AS VP Percent: 0.04 %
Brady Statistic AS VS Percent: 99.55 %
Brady Statistic RA Percent Paced: 0.41 %
Brady Statistic RV Percent Paced: 0.04 %
Date Time Interrogation Session: 20230314125254
HighPow Impedance: 74 Ohm
Implantable Lead Implant Date: 20140718
Implantable Lead Implant Date: 20140718
Implantable Lead Location: 753859
Implantable Lead Location: 753860
Implantable Lead Model: 5076
Implantable Lead Model: 6935
Implantable Pulse Generator Implant Date: 20140718
Lead Channel Impedance Value: 304 Ohm
Lead Channel Impedance Value: 380 Ohm
Lead Channel Impedance Value: 380 Ohm
Lead Channel Pacing Threshold Amplitude: 0.625 V
Lead Channel Pacing Threshold Amplitude: 0.625 V
Lead Channel Pacing Threshold Pulse Width: 0.4 ms
Lead Channel Pacing Threshold Pulse Width: 0.4 ms
Lead Channel Sensing Intrinsic Amplitude: 13 mV
Lead Channel Sensing Intrinsic Amplitude: 13 mV
Lead Channel Sensing Intrinsic Amplitude: 3.375 mV
Lead Channel Sensing Intrinsic Amplitude: 3.375 mV
Lead Channel Setting Pacing Amplitude: 2 V
Lead Channel Setting Pacing Amplitude: 2.5 V
Lead Channel Setting Pacing Pulse Width: 0.4 ms
Lead Channel Setting Sensing Sensitivity: 0.3 mV

## 2021-03-26 ENCOUNTER — Other Ambulatory Visit: Payer: Self-pay | Admitting: Internal Medicine

## 2021-04-01 NOTE — Progress Notes (Signed)
Remote ICD transmission.   

## 2021-04-15 DIAGNOSIS — G4733 Obstructive sleep apnea (adult) (pediatric): Secondary | ICD-10-CM | POA: Diagnosis not present

## 2021-05-04 DIAGNOSIS — G4733 Obstructive sleep apnea (adult) (pediatric): Secondary | ICD-10-CM | POA: Diagnosis not present

## 2021-05-15 DIAGNOSIS — G4733 Obstructive sleep apnea (adult) (pediatric): Secondary | ICD-10-CM | POA: Diagnosis not present

## 2021-05-20 DIAGNOSIS — Z79899 Other long term (current) drug therapy: Secondary | ICD-10-CM | POA: Diagnosis not present

## 2021-05-22 ENCOUNTER — Ambulatory Visit: Payer: Medicare Other | Admitting: Endocrinology

## 2021-05-23 DIAGNOSIS — Z1211 Encounter for screening for malignant neoplasm of colon: Secondary | ICD-10-CM | POA: Diagnosis not present

## 2021-05-27 ENCOUNTER — Other Ambulatory Visit: Payer: Self-pay | Admitting: Cardiovascular Disease

## 2021-05-27 DIAGNOSIS — N1832 Chronic kidney disease, stage 3b: Secondary | ICD-10-CM | POA: Diagnosis not present

## 2021-05-27 DIAGNOSIS — D6859 Other primary thrombophilia: Secondary | ICD-10-CM | POA: Diagnosis not present

## 2021-05-27 DIAGNOSIS — I428 Other cardiomyopathies: Secondary | ICD-10-CM | POA: Diagnosis not present

## 2021-05-27 DIAGNOSIS — I4891 Unspecified atrial fibrillation: Secondary | ICD-10-CM | POA: Diagnosis not present

## 2021-05-27 DIAGNOSIS — H539 Unspecified visual disturbance: Secondary | ICD-10-CM | POA: Diagnosis not present

## 2021-05-27 DIAGNOSIS — I5022 Chronic systolic (congestive) heart failure: Secondary | ICD-10-CM | POA: Diagnosis not present

## 2021-05-27 DIAGNOSIS — I272 Pulmonary hypertension, unspecified: Secondary | ICD-10-CM | POA: Diagnosis not present

## 2021-05-27 DIAGNOSIS — Z1211 Encounter for screening for malignant neoplasm of colon: Secondary | ICD-10-CM | POA: Diagnosis not present

## 2021-05-27 DIAGNOSIS — I2510 Atherosclerotic heart disease of native coronary artery without angina pectoris: Secondary | ICD-10-CM | POA: Diagnosis not present

## 2021-06-15 DIAGNOSIS — G4733 Obstructive sleep apnea (adult) (pediatric): Secondary | ICD-10-CM | POA: Diagnosis not present

## 2021-06-16 ENCOUNTER — Other Ambulatory Visit: Payer: Self-pay | Admitting: Internal Medicine

## 2021-06-16 NOTE — Telephone Encounter (Signed)
Prescription refill request for Eliquis received. Indication: Atrial Fib Last office visit: 12/31/20  Olin Pia MD Scr: 1.60 on 09/10/20 Age: 76 Weight: 94.1kg  Based on above findings Eliquis 5mg  twice daily is the appropriate dose.  Refill approved.

## 2021-06-17 ENCOUNTER — Ambulatory Visit (INDEPENDENT_AMBULATORY_CARE_PROVIDER_SITE_OTHER): Payer: Medicare Other

## 2021-06-17 DIAGNOSIS — I428 Other cardiomyopathies: Secondary | ICD-10-CM

## 2021-06-17 LAB — CUP PACEART REMOTE DEVICE CHECK
Battery Remaining Longevity: 11 mo
Battery Voltage: 2.87 V
Brady Statistic AP VP Percent: 0 %
Brady Statistic AP VS Percent: 0.82 %
Brady Statistic AS VP Percent: 0.04 %
Brady Statistic AS VS Percent: 99.14 %
Brady Statistic RA Percent Paced: 0.82 %
Brady Statistic RV Percent Paced: 0.04 %
Date Time Interrogation Session: 20230613115334
HighPow Impedance: 71 Ohm
Implantable Lead Implant Date: 20140718
Implantable Lead Implant Date: 20140718
Implantable Lead Location: 753859
Implantable Lead Location: 753860
Implantable Lead Model: 5076
Implantable Lead Model: 6935
Implantable Pulse Generator Implant Date: 20140718
Lead Channel Impedance Value: 304 Ohm
Lead Channel Impedance Value: 380 Ohm
Lead Channel Impedance Value: 399 Ohm
Lead Channel Pacing Threshold Amplitude: 0.5 V
Lead Channel Pacing Threshold Amplitude: 0.625 V
Lead Channel Pacing Threshold Pulse Width: 0.4 ms
Lead Channel Pacing Threshold Pulse Width: 0.4 ms
Lead Channel Sensing Intrinsic Amplitude: 13.5 mV
Lead Channel Sensing Intrinsic Amplitude: 13.5 mV
Lead Channel Sensing Intrinsic Amplitude: 3.875 mV
Lead Channel Sensing Intrinsic Amplitude: 3.875 mV
Lead Channel Setting Pacing Amplitude: 2 V
Lead Channel Setting Pacing Amplitude: 2.5 V
Lead Channel Setting Pacing Pulse Width: 0.4 ms
Lead Channel Setting Sensing Sensitivity: 0.3 mV

## 2021-06-20 ENCOUNTER — Other Ambulatory Visit: Payer: Self-pay | Admitting: Internal Medicine

## 2021-06-27 DIAGNOSIS — M8588 Other specified disorders of bone density and structure, other site: Secondary | ICD-10-CM | POA: Diagnosis not present

## 2021-06-27 DIAGNOSIS — Z1231 Encounter for screening mammogram for malignant neoplasm of breast: Secondary | ICD-10-CM | POA: Diagnosis not present

## 2021-06-27 DIAGNOSIS — Z780 Asymptomatic menopausal state: Secondary | ICD-10-CM | POA: Diagnosis not present

## 2021-06-27 DIAGNOSIS — M8589 Other specified disorders of bone density and structure, multiple sites: Secondary | ICD-10-CM | POA: Diagnosis not present

## 2021-06-27 DIAGNOSIS — M85851 Other specified disorders of bone density and structure, right thigh: Secondary | ICD-10-CM | POA: Diagnosis not present

## 2021-07-01 DIAGNOSIS — Z0289 Encounter for other administrative examinations: Secondary | ICD-10-CM | POA: Diagnosis not present

## 2021-07-01 DIAGNOSIS — I700 Atherosclerosis of aorta: Secondary | ICD-10-CM | POA: Diagnosis not present

## 2021-07-01 DIAGNOSIS — Z1239 Encounter for other screening for malignant neoplasm of breast: Secondary | ICD-10-CM | POA: Diagnosis not present

## 2021-07-01 DIAGNOSIS — Z79899 Other long term (current) drug therapy: Secondary | ICD-10-CM | POA: Diagnosis not present

## 2021-07-03 ENCOUNTER — Ambulatory Visit (INDEPENDENT_AMBULATORY_CARE_PROVIDER_SITE_OTHER): Payer: Medicare Other | Admitting: Internal Medicine

## 2021-07-03 ENCOUNTER — Encounter: Payer: Self-pay | Admitting: Internal Medicine

## 2021-07-03 VITALS — BP 112/60 | HR 62 | Ht <= 58 in | Wt 208.0 lb

## 2021-07-03 DIAGNOSIS — I4819 Other persistent atrial fibrillation: Secondary | ICD-10-CM | POA: Diagnosis not present

## 2021-07-03 DIAGNOSIS — I472 Ventricular tachycardia, unspecified: Secondary | ICD-10-CM

## 2021-07-03 DIAGNOSIS — Z95810 Presence of automatic (implantable) cardiac defibrillator: Secondary | ICD-10-CM | POA: Diagnosis not present

## 2021-07-03 DIAGNOSIS — I5022 Chronic systolic (congestive) heart failure: Secondary | ICD-10-CM

## 2021-07-03 DIAGNOSIS — Z79899 Other long term (current) drug therapy: Secondary | ICD-10-CM

## 2021-07-03 DIAGNOSIS — I428 Other cardiomyopathies: Secondary | ICD-10-CM | POA: Diagnosis not present

## 2021-07-03 DIAGNOSIS — I493 Ventricular premature depolarization: Secondary | ICD-10-CM

## 2021-07-03 NOTE — Progress Notes (Signed)
Patient Care Team: Cain Saupe, MD as PCP - General (Family Medicine) Duke Salvia, MD as PCP - Electrophysiology (Cardiology) Delma Freeze, FNP as Nurse Practitioner (Cardiology) Duke Salvia, MD as Consulting Physician (Cardiology) Antonieta Iba, MD as Consulting Physician (Cardiology)   HPI  Erin [name] is a 76 y.o. female seen in followup for Medtronic ICD implanted in the context of a nonischemic and ischemic cardiomyopathy and VT; Hx of afib- persistent. Stenting x2 on Clopidigrel and anticoagulation w apixoban.  Approaching ERI  VT previously managed with amio, but stopped 2/2 hyperthyroidism, Rx w mex alone, then resumed Amio 2/2 increasing VT burden ( discussed with Dr Lorel Monaco -Endo)  TSH noted 12/22 to be high, and methimazole redosed per SE   The patient denies chest pain, orthopnea or peripheral edema.  There have been no palpitations, lightheadedness or syncope.  Complains of dyspnea on exertion. .   Patient denies symptoms of respiratory, GI intolerance, sun sensitivity, neurological symptoms attributable to amiodarone.         8/23--ICD alarm.  There was appropriate ATP.  Apparently a question about high RV threshold although the report, RD-PA, "no evidence of RV lead failure "          Date Cr Hgb TSH ALT  8/18   4.9 16  11/18    2.13   12/19  11.3 26>>6.04 (2/20)   5/20 1.35    56  7/20 1.49  0.68 52  12/20 1.09 10.5 0.006 11  3/21   0.02<<0.01   5/21  12.6 (7/21) 2.07 19  12/21 1.17  4.46<< 75   6/22 1.29  6.08   8/22 1.83     9/22 1.6 4.2 6.84 (12/22)<<4.39 16  3/23   14.4     DATE TEST EF   5/14 Echo  20-25%   8/16 Echo  25 % (44/2.1/35)m LAE  8/18 Echo  20-25%   8/18 Cath  T Cx///90% LAD>>DES  11/18 Echo   25-30%   6/21 PYP  Ratio 1 equivocal    PVCs  2.5 /Hr >> 15/hr (6/23)     Thromboembolic risk factors ( age -64 , HTN-1, Vasc disease -1, CHF-1, Gender-1) for a CHADSVASc Score of >=6  Past Medical History:  Diagnosis Date    Anginal pain (HCC)    Automatic implantable cardioverter-defibrillator in situ    a. s/p MDT ICD 07/2012; b. SN # PJN [id number]    CAD (coronary artery disease)    a. cath 2003: LM nl, mid LAD 50%, LCx nl, RCA nl b. L&RHC 08/17/2014 100% occluded mid LCx, 70% mid LAD with FFR 0.82. Medical therapy. CI 2.1. CO 4.01c. LHC 8/18: CTO LCx w/ R-L colatts, LAD s/p PCI/DES x 2   Chronic systolic CHF (congestive heart failure) (HCC)    a. echo 2014: EF 25%, diffuse HK, LA moderately dilated, RA mildly dilated, PASP 38 mm Hg; b. echo 08/2014: EF 25-30%, diffuse HK, RWMA cannot be excluded, mild MR, mild biatrial enlargement, RV mildly dilated, wall thickness nl, pacer wire/catheter noted, mild to mod TR, PASP high nl, c. TTE 8/18: EF 25-30%, diffuse HK, GR1DD, mod MR, mod dilated LA, nl RV sys fxn, PASP 56   Encounter for long-term (current) use of anticoagulants    Exertional shortness of breath    H/O medication noncompliance    High cholesterol    Hypertension    NICM (nonischemic cardiomyopathy) (HCC)    a. s/p AICD  07/22/2012   Obesity    OSA on CPAP    "suppose to; not often" (07/22/2012)   Other "heavy-for-dates" infants    PAF (paroxysmal atrial fibrillation) (HCC)    a. on warfarin--> Eliquis 08/2014    Past Surgical History:  Procedure Laterality Date   ABDOMINAL HYSTERECTOMY  ~ 1998   "laparoscopic" (07/22/2012)   CARDIAC CATHETERIZATION  ? 1990's   CARDIAC CATHETERIZATION N/A 08/17/2014   Procedure: Right/Left Heart Cath and Coronary Angiography;  Surgeon: Kathleene Hazel, MD;  Location: [facility] INVASIVE CV LAB;  Service: Cardiovascular;  Laterality: N/A;   CARDIAC DEFIBRILLATOR PLACEMENT  07/22/2012   dual-chamber ICD.   CARDIOVERSION N/A 01/13/2019   Procedure: CARDIOVERSION;  Surgeon: Antonieta Iba, MD;  Location: [facility] ORS;  Service: Cardiovascular;  Laterality: N/A;   CORONARY STENT INTERVENTION N/A 08/24/2016   Procedure: CORONARY STENT INTERVENTION;  Surgeon: Iran Ouch,  MD;  Location: [facility] INVASIVE CV LAB;  Service: Cardiovascular;  Laterality: N/A;   IMPLANTABLE CARDIOVERTER DEFIBRILLATOR IMPLANT N/A 07/22/2012   Procedure: IMPLANTABLE CARDIOVERTER DEFIBRILLATOR IMPLANT;  Surgeon: Marinus Maw, MD;  Location: [facility] CATH LAB;  Service: Cardiovascular;  Laterality: N/A;   LEFT HEART CATH AND CORONARY ANGIOGRAPHY N/A 08/24/2016   Procedure: LEFT HEART CATH AND CORONARY ANGIOGRAPHY;  Surgeon: Iran Ouch, MD;  Location: [facility] INVASIVE CV LAB;  Service: Cardiovascular;  Laterality: N/A;   TEE WITHOUT CARDIOVERSION N/A 08/21/2014   Procedure: TRANSESOPHAGEAL ECHOCARDIOGRAM (TEE);  Surgeon: Chilton Si, MD;  Location: [facility] ENDOSCOPY;  Service: Cardiovascular;  Laterality: N/A;   TUBAL LIGATION  ~ 1978    Current Outpatient Medications  Medication Sig Dispense Refill   amiodarone (PACERONE) 200 MG tablet Take 1 tablet by mouth once daily 90 tablet 0   apixaban (ELIQUIS) 5 MG TABS tablet Take 1 tablet by mouth twice daily 60 tablet 5   atorvastatin (LIPITOR) 40 MG tablet TAKE 1 TABLET BY MOUTH  DAILY 90 tablet 2   clopidogrel (PLAVIX) 75 MG tablet TAKE 1 TABLET BY MOUTH  DAILY 90 tablet 0   ezetimibe (ZETIA) 10 MG tablet TAKE 1 TABLET BY MOUTH  DAILY 90 tablet 0   methimazole (TAPAZOLE) 5 MG tablet Take 0.5 tablets (2.5 mg total) by mouth 3 (three) times a week. 20 tablet 3   propranolol ER (INDERAL LA) 160 MG SR capsule TAKE 1 CAPSULE BY MOUTH  DAILY 90 capsule 0   sacubitril-valsartan (ENTRESTO) 24-26 MG Take 1 tablet by mouth 2 (two) times daily. 180 tablet 3   spironolactone (ALDACTONE) 25 MG tablet TAKE ONE-HALF TABLET BY  MOUTH DAILY 45 tablet 3   torsemide (DEMADEX) 20 MG tablet TAKE 2 TABLETS BY MOUTH  DAILY 180 tablet 3   No current facility-administered medications for this visit.    No Known Allergies  Review of Systems negative except from HPI and PMH  Physical Exam BP 112/60   Pulse 62   Ht 4\' 10"  (1.473 m)   Wt 208 lb (94.3 kg)   SpO2  97%   BMI 43.47 kg/m   Well developed and Morbidly obese  in no acute distress HENT normal Neck supple with JVP-flat Clear Device pocket well healed; without hematoma or erythema.  There is no tethering  Regular rate and rhythm,  murmur Abd-soft with active BS No Clubbing cyanosis   edema Skin-warm and dry A & Oriented  Grossly normal sensory and motor function  ECG sinus at 62 Interval 20/09/49 Low voltage   Assessment and  Plan Atrial  fibrillation-persistent  Nonischemic cardiomyopathy  Coronary artery disease-2 vessel with recent stenting  Ventricular tachycardia-recurrent treated via device  Implantable defibrillator-Medtronic  Treated hyperthyroidism/hypothryoidism  Congestive heart failure-chronic sys  Hypertransaminasemia normalized off amiodarone-- normal on resumed amio   PVCs  Low voltage ECG  neg PYP   No interval ventricular tachycardia.  We will continue the amiodarone at 200 mg a day.  No intercurrent atrial arrhythmias.  We will continue the Eliquis at 5 twice daily.  She is hypothyroid.  Instructions from Dr. Everardo All prior to his retirement was to have her discontinue her methimazole; unfortunately she never got this message.  We will discontinue it today.  We will check her amiodarone surveillance laboratories and this will give the new doctor a TSH prior to the discontinuation of methimazole.  We will plan to see her in 6 months time and anticipate at that time decreasing her amiodarone if she remains arrhythmia free.  No angina.  Continue Eliquis and Lipitor and Plavix  With her cardiomyopathy, we will continue her on Aldactone and Entresto 24/26  She is euvolemic we will continue to torsemide

## 2021-07-03 NOTE — Patient Instructions (Signed)
Medication Instructions:  Your physician recommends that you continue on your current medications as directed. Please refer to the Current Medication list given to you today.  *If you need a refill on your cardiac medications before your next appointment, please call your pharmacy*   Lab Work: CBC, TSH and Liver Panel  If you have labs (blood work) drawn today and your tests are completely normal, you will receive your results only by: MyChart Message (if you have MyChart) OR A paper copy in the mail If you have any lab test that is abnormal or we need to change your treatment, we will call you to review the results.   Testing/Procedures: None ordered.    Follow-Up: At [facility], you and your health needs are our priority.  As part of our continuing mission to provide you with exceptional heart care, we have created designated Provider Care Teams.  These Care Teams include your primary Cardiologist (physician) and Advanced Practice Providers (APPs -  Physician Assistants and Nurse Practitioners) who all work together to provide you with the care you need, when you need it.  We recommend signing up for the patient portal called "MyChart".  Sign up information is provided on this After Visit Summary.  MyChart is used to connect with patients for Virtual Visits (Telemedicine).  Patients are able to view lab/test results, encounter notes, upcoming appointments, etc.  Non-urgent messages can be sent to your provider as well.   To learn more about what you can do with MyChart, go to [email or web address].    Your next appointment:   6 months with Dr Graciela Husbands  Important Information About Sugar

## 2021-07-04 LAB — HEPATIC FUNCTION PANEL
ALT: 33 IU/L — ABNORMAL HIGH (ref 0–32)
AST: 36 IU/L (ref 0–40)
Albumin: 4.5 g/dL (ref 3.7–4.7)
Alkaline Phosphatase: 82 IU/L (ref 44–121)
Bilirubin Total: 0.6 mg/dL (ref 0.0–1.2)
Bilirubin, Direct: 0.15 mg/dL (ref 0.00–0.40)
Total Protein: 7.8 g/dL (ref 6.0–8.5)

## 2021-07-04 LAB — TSH: TSH: 7.4 u[IU]/mL — ABNORMAL HIGH (ref 0.450–4.500)

## 2021-07-04 LAB — CBC
Hematocrit: 39.7 % (ref 34.0–46.6)
Hemoglobin: 13.2 g/dL (ref 11.1–15.9)
MCH: 27.2 pg (ref 26.6–33.0)
MCHC: 33.2 g/dL (ref 31.5–35.7)
MCV: 82 fL (ref 79–97)
Platelets: 315 10*3/uL (ref 150–450)
RBC: 4.86 x10E6/uL (ref 3.77–5.28)
RDW: 14.4 % (ref 11.7–15.4)
WBC: 6 10*3/uL (ref 3.4–10.8)

## 2021-07-12 NOTE — Progress Notes (Unsigned)
Date:  07/12/2021   ID:  Erin [name], DOB 01-06-1945, MRN [id number]  Patient Location:  [address] [place] [address]   Provider location:   [employer], [place] office  PCP:  Cain Saupe, MD  Cardiologist:  [facility] No chief complaint on file.   History of Present Illness:    Erin [name] is a 76 y.o. female past medical history of coronary artery disease,  paroxysmal atrial fibrillation,  chronic systolic CHF   Medtronic ICD placed July 2014 lost to follow-up for the past 2 years, Recently ran out of her medications who presented to [facility] 08/20/2016  after episode of weakness, ICD shock while she was at the supermarket Ejection fraction 25 to 30% in November 2018 Right heart pressures of 70 at that time PCI of mid LAD disease, 2 drug-eluting stents. 08/2016 occluded left circumflex with right-to-left collaterals.  She presents for follow-up of her cardiomyopathy, sustained VT   LOV  with myself 7/22 Seen by Dr. Graciela Husbands 06/2021   chronic systolic heart failure related to  atrial fibrillation-persistent. Echo and stress test 2018 Cardioversion 01/2019 for atrial fib Seen by dr. Graciela Husbands 06/2020 Downloads reviewed Frequent nonsustained ventricular tachycardia and treated ventricular tachycardia in the setting of having withdrawal amiodarone because of hyperthyroidism  Dr. Everardo All ok to restart amiodarone  unable to afford Entresto. spironolactone 12.5  Inderal LA 160   Amio was stopped 2/2 amio assoc hyperthyroidism--Rx with methimazole.. Became hypothyroid and was treated with levothyroxine   Some discussion of stopping mexiletine and going back on amiodarone given continued nonsustained VT runs  BMP stable On torsemide 40 daily No edema, ABD not distended   EKG personally reviewed by myself on todays visit Shows normal sinus rhythm rate 69 bpm nonspecific T wave abnormality  Past medical history reviewed August  2018 Medtronic ICD device interrogated showing appropriate shock for VT  numerous episodes of VT with rate 167 up to 200 bpm, no therapy given Device reprogrammed 08/20/2016 Started on amiodarone infusion, Echocardiogram at that time ejection fraction 25% with dilated LV, global hypokinesis Stress test results discussed with her in detail, anterior wall ischemia   cardiac catheterization showed significant two-vessel coronary artery disease with known chronically occluded left circumflex with right-to-left collaterals. There was progression of mid LAD disease.  -- treated successfully with PCI and 2 overlapped drug-eluting stent placement.  First diagonal was jailed by the stent with sluggish flow but no significant ostial stenosis.     Prior CV studies:   The following studies were reviewed today:    Past Medical History:  Diagnosis Date   Anginal pain (HCC)    Automatic implantable cardioverter-defibrillator in situ    a. s/p MDT ICD 07/2012; b. SN # [id number]    CAD (coronary artery disease)    a. cath 2003: LM nl, mid LAD 50%, LCx nl, RCA nl b. L&RHC 08/17/2014 100% occluded mid LCx, 70% mid LAD with FFR 0.82. Medical therapy. CI 2.1. CO 4.01c. LHC 8/18: CTO LCx w/ R-L colatts, LAD s/p PCI/DES x 2   Chronic systolic CHF (congestive heart failure) (HCC)    a. echo 2014: EF 25%, diffuse HK, LA moderately dilated, RA mildly dilated, PASP 38 mm Hg; b. echo 08/2014: EF 25-30%, diffuse HK, RWMA cannot be excluded, mild MR, mild biatrial enlargement, RV mildly dilated, wall thickness nl, pacer wire/catheter noted, mild to mod TR, PASP high nl, c. TTE 8/18: EF  25-30%, diffuse HK, GR1DD, mod MR, mod dilated LA, nl RV sys fxn, PASP 56   Encounter for long-term (current) use of anticoagulants    Exertional shortness of breath    H/O medication noncompliance    High cholesterol    Hypertension    NICM (nonischemic cardiomyopathy) (HCC)    a. s/p AICD 07/22/2012   Obesity    OSA on CPAP     "suppose to; not often" (07/22/2012)   Other "heavy-for-dates" infants    PAF (paroxysmal atrial fibrillation) (HCC)    a. on warfarin--> Eliquis 08/2014   Past Surgical History:  Procedure Laterality Date   ABDOMINAL HYSTERECTOMY  ~ 1998   "laparoscopic" (07/22/2012)   CARDIAC CATHETERIZATION  ? 1990's   CARDIAC CATHETERIZATION N/A 08/17/2014   Procedure: Right/Left Heart Cath and Coronary Angiography;  Surgeon: Kathleene Hazel, MD;  Location: [facility] INVASIVE CV LAB;  Service: Cardiovascular;  Laterality: N/A;   CARDIAC DEFIBRILLATOR PLACEMENT  07/22/2012   dual-chamber ICD.   CARDIOVERSION N/A 01/13/2019   Procedure: CARDIOVERSION;  Surgeon: Antonieta Iba, MD;  Location: [facility] ORS;  Service: Cardiovascular;  Laterality: N/A;   CORONARY STENT INTERVENTION N/A 08/24/2016   Procedure: CORONARY STENT INTERVENTION;  Surgeon: Iran Ouch, MD;  Location: [facility] INVASIVE CV LAB;  Service: Cardiovascular;  Laterality: N/A;   IMPLANTABLE CARDIOVERTER DEFIBRILLATOR IMPLANT N/A 07/22/2012   Procedure: IMPLANTABLE CARDIOVERTER DEFIBRILLATOR IMPLANT;  Surgeon: Marinus Maw, MD;  Location: [facility] CATH LAB;  Service: Cardiovascular;  Laterality: N/A;   LEFT HEART CATH AND CORONARY ANGIOGRAPHY N/A 08/24/2016   Procedure: LEFT HEART CATH AND CORONARY ANGIOGRAPHY;  Surgeon: Iran Ouch, MD;  Location: [facility] INVASIVE CV LAB;  Service: Cardiovascular;  Laterality: N/A;   TEE WITHOUT CARDIOVERSION N/A 08/21/2014   Procedure: TRANSESOPHAGEAL ECHOCARDIOGRAM (TEE);  Surgeon: Chilton Si, MD;  Location: [facility] ENDOSCOPY;  Service: Cardiovascular;  Laterality: N/A;   TUBAL LIGATION  ~ 1978     No outpatient medications have been marked as taking for the 07/14/21 encounter (Appointment) with Antonieta Iba, MD.     Allergies:   Patient has no known allergies.   Social History   Tobacco Use   Smoking status: Never   Smokeless tobacco: Never  Vaping Use   Vaping Use: Never used  Substance Use Topics    Alcohol use: No   Drug use: No     Family Hx: The patient's family history includes Diabetes in her sister; Heart disease in her father and mother; Heart failure in her brother; Lung disease in her sister. There is no history of Thyroid disease.  ROS:   Please see the history of present illness.    Review of Systems  Constitutional: Negative.   HENT: Negative.    Respiratory: Negative.    Cardiovascular: Negative.   Gastrointestinal: Negative.   Musculoskeletal: Negative.   Neurological: Negative.   Psychiatric/Behavioral: Negative.    All other systems reviewed and are negative.    Labs/Other Tests and Data Reviewed:    Recent Labs: 08/27/2020: B Natriuretic Peptide 322.1; Magnesium 2.5 09/10/2020: BUN 33; Creatinine, Ser 1.60; Potassium 4.2; Sodium 139 07/03/2021: ALT 33; Hemoglobin 13.2; Platelets 315; TSH 7.400   Recent Lipid Panel Lab Results  Component Value Date/Time   CHOL 158 06/22/2019 09:08 AM   CHOL 154 05/02/2019 12:18 PM   TRIG 90 06/22/2019 09:08 AM   HDL 57 06/22/2019 09:08 AM   HDL 47 05/02/2019 12:18 PM   CHOLHDL 2.8 06/22/2019 09:08 AM   LDLCALC  83 06/22/2019 09:08 AM   LDLCALC 87 05/02/2019 12:18 PM    Wt Readings from Last 3 Encounters:  07/03/21 208 lb (94.3 kg)  03/12/21 212 lb (96.2 kg)  02/24/21 210 lb 9.6 oz (95.5 kg)     Exam:    There were no vitals taken for this visit. Constitutional:  oriented to person, place, and time. No distress.  HENT:  Head: Grossly normal Eyes:  no discharge. No scleral icterus.  Neck: No JVD, no carotid bruits  Cardiovascular: Regular rate and rhythm, no murmurs appreciated Pulmonary/Chest: Clear to auscultation bilaterally, no wheezes or rails Abdominal: Soft.  no distension.  no tenderness.  Musculoskeletal: Normal range of motion Neurological:  normal muscle tone. Coordination normal. No atrophy Skin: Skin warm and dry Psychiatric: normal affect, pleasant   ASSESSMENT & PLAN:    Atherosclerosis  of native coronary artery of native heart with stable angina pectoris (HCC) - Cholesterol slightly above goal, continue current medications, no further testing, weight loss recommended  Chronic systolic CHF (congestive heart failure) (HCC)  We will continue torsemide 40 daily Discussed extra torsemide after lunch if any abdominal distention or leg edema PND orthopnea  Essential hypertension We will try to get coverage for Entresto, if covered we will stop the losartan  NICM (nonischemic cardiomyopathy) (HCC) Unable to afford [name] We will try company assistance continue losartan  spironolactone with her torsemide 40 Also on propranolol mexiletine Consideration of holding mexiletine going back on amiodarone per EP  Complex sleep apnea syndrome Weight loss recommended  Pulmonary HTN (HCC) Renal function stable, symptoms stable on torsemide 20 daily  PAF (paroxysmal atrial fibrillation) (HCC) Has been on mexiletine and propranolol Discussions to go back on amiodarone per EP Cardioversion January 2021 Will try company assistance for her Eliquis  Hyperthyroidism Managed by endocrine  Ventricular tachycardia (HCC) Managed by Dr. Graciela Husbands On mexiletine, propranolol Management as above    Total encounter time more than 25 minutes  Greater than 50% was spent in counseling and coordination of care with the patient    Signed, Julien Nordmann, MD  07/12/2021 10:48 PM    [facility] [address], [place], [place] [address]

## 2021-07-14 ENCOUNTER — Ambulatory Visit (INDEPENDENT_AMBULATORY_CARE_PROVIDER_SITE_OTHER): Payer: Medicare Other | Admitting: Cardiovascular Disease

## 2021-07-14 ENCOUNTER — Encounter: Payer: Self-pay | Admitting: Cardiovascular Disease

## 2021-07-14 VITALS — BP 126/70 | HR 68 | Ht <= 58 in | Wt 209.0 lb

## 2021-07-14 DIAGNOSIS — I428 Other cardiomyopathies: Secondary | ICD-10-CM | POA: Diagnosis not present

## 2021-07-14 DIAGNOSIS — I5022 Chronic systolic (congestive) heart failure: Secondary | ICD-10-CM

## 2021-07-14 DIAGNOSIS — I25118 Atherosclerotic heart disease of native coronary artery with other forms of angina pectoris: Secondary | ICD-10-CM

## 2021-07-14 DIAGNOSIS — I493 Ventricular premature depolarization: Secondary | ICD-10-CM | POA: Diagnosis not present

## 2021-07-14 DIAGNOSIS — I472 Ventricular tachycardia, unspecified: Secondary | ICD-10-CM | POA: Diagnosis not present

## 2021-07-14 DIAGNOSIS — I480 Paroxysmal atrial fibrillation: Secondary | ICD-10-CM

## 2021-07-14 DIAGNOSIS — I4819 Other persistent atrial fibrillation: Secondary | ICD-10-CM

## 2021-07-14 DIAGNOSIS — I272 Pulmonary hypertension, unspecified: Secondary | ICD-10-CM | POA: Diagnosis not present

## 2021-07-14 DIAGNOSIS — Z95810 Presence of automatic (implantable) cardiac defibrillator: Secondary | ICD-10-CM

## 2021-07-14 NOTE — Patient Instructions (Addendum)
Medication Instructions:  Please try nasal spray twice a day: fluticasone (generic of flonase) Also try cetirazine (generic of zyrtec)  If you need a refill on your cardiac medications before your next appointment, please call your pharmacy.   Lab work: No new labs needed  Testing/Procedures: Your physician has requested that you have an echocardiogram. Echocardiography is a painless test that uses sound waves to create images of your heart. It provides your doctor with information about the size and shape of your heart and how well your heart's chambers and valves are working. This procedure takes approximately one hour. There are no restrictions for this procedure.   Follow-Up: At [facility], you and your health needs are our priority.  As part of our continuing mission to provide you with exceptional heart care, we have created designated Provider Care Teams.  These Care Teams include your primary Cardiologist (physician) and Advanced Practice Providers (APPs -  Physician Assistants and Nurse Practitioners) who all work together to provide you with the care you need, when you need it.  You will need a follow up appointment in 6 months  Providers on your designated Care Team:   Erin Ducking, NP Erin Listen, PA-C Erin [name], [place]  COVID-19 Vaccine Information can be found at: [email or web address] For questions related to vaccine distribution or appointments, please email [email or web address] or call [phone].

## 2021-07-15 DIAGNOSIS — G4733 Obstructive sleep apnea (adult) (pediatric): Secondary | ICD-10-CM | POA: Diagnosis not present

## 2021-07-16 ENCOUNTER — Ambulatory Visit: Payer: Medicare Other | Admitting: Nurse Practitioner

## 2021-07-23 ENCOUNTER — Ambulatory Visit: Payer: Medicare Other | Admitting: Internal Medicine

## 2021-07-24 ENCOUNTER — Ambulatory Visit: Payer: Medicare Other | Admitting: Internal Medicine

## 2021-07-24 NOTE — Progress Notes (Deleted)
Name: Erin [name]  MRN/ DOB: [id number], 1945/10/08    Age/ Sex: 76 y.o., female     PCP: Cain Saupe, MD   Reason for Endocrinology Evaluation: Amiodarone induced thyroid disease     Initial Endocrinology Clinic Visit: 07/12/2018    PATIENT IDENTIFIER: Ms. Erin BOWMER is a 76 y.o., female with a past medical history of CAD, CHF, paroxysmal atrial fibrillation (S/P ICD placement 2014) . She has followed with [facility] since 07/12/2018 for consultative assistance with management of her amiodarone-induced thyroid disease.   HISTORICAL SUMMARY:  Ms. Erin was started on amiodarone in 2014 and by 2015 she has been noted with an elevated TSH at 4.920 u IU/mL.  But by 2016 the patient has been noted with a suppressed TSH at 0.018 u IU/mL, she was started on methimazole by 2018 but this was discontinued in 2019 and was started on a short-term LT-for replacement by her previous endocrinologist June 2020.   Amiodarone was initially discontinued in 2020 but she has been back on it since 2022 due to persistent atrial fibrillation She has been restarted on methimazole from January 2021   SUBJECTIVE:    Today (07/24/2021):  Ms. [name] is here for hyperthyroid    Methimazole 5 mg, half a tablet 3 times a week   ROS:  As per HPI.   HISTORY:  Past Medical History:  Past Medical History:  Diagnosis Date   Anginal pain (HCC)    Automatic implantable cardioverter-defibrillator in situ    a. s/p MDT ICD 07/2012; b. SN # [id number]    CAD (coronary artery disease)    a. cath 2003: LM nl, mid LAD 50%, LCx nl, RCA nl b. L&RHC 08/17/2014 100% occluded mid LCx, 70% mid LAD with FFR 0.82. Medical therapy. CI 2.1. CO 4.01c. LHC 8/18: CTO LCx w/ R-L colatts, LAD s/p PCI/DES x 2   Chronic systolic CHF (congestive heart failure) (HCC)    a. echo 2014: EF 25%, diffuse HK, LA moderately dilated, RA mildly dilated, PASP 38 mm Hg; b. echo 08/2014: EF 25-30%, diffuse HK, RWMA  cannot be excluded, mild MR, mild biatrial enlargement, RV mildly dilated, wall thickness nl, pacer wire/catheter noted, mild to mod TR, PASP high nl, c. TTE 8/18: EF 25-30%, diffuse HK, GR1DD, mod MR, mod dilated LA, nl RV sys fxn, PASP 56   Encounter for long-term (current) use of anticoagulants    Exertional shortness of breath    H/O medication noncompliance    High cholesterol    Hypertension    NICM (nonischemic cardiomyopathy) (HCC)    a. s/p AICD 07/22/2012   Obesity    OSA on CPAP    "suppose to; not often" (07/22/2012)   Other "heavy-for-dates" infants    PAF (paroxysmal atrial fibrillation) (HCC)    a. on warfarin--> Eliquis 08/2014   Past Surgical History:  Past Surgical History:  Procedure Laterality Date   ABDOMINAL HYSTERECTOMY  ~ 1998   "laparoscopic" (07/22/2012)   CARDIAC CATHETERIZATION  ? 1990's   CARDIAC CATHETERIZATION N/A 08/17/2014   Procedure: Right/Left Heart Cath and Coronary Angiography;  Surgeon: Kathleene Hazel, MD;  Location: [facility] INVASIVE CV LAB;  Service: Cardiovascular;  Laterality: N/A;   CARDIAC DEFIBRILLATOR PLACEMENT  07/22/2012   dual-chamber ICD.   CARDIOVERSION N/A 01/13/2019   Procedure: CARDIOVERSION;  Surgeon: Antonieta Iba, MD;  Location: [facility] ORS;  Service: Cardiovascular;  Laterality: N/A;   CORONARY STENT INTERVENTION N/A 08/24/2016   Procedure: CORONARY  STENT INTERVENTION;  Surgeon: Iran Ouch, MD;  Location: [facility] INVASIVE CV LAB;  Service: Cardiovascular;  Laterality: N/A;   IMPLANTABLE CARDIOVERTER DEFIBRILLATOR IMPLANT N/A 07/22/2012   Procedure: IMPLANTABLE CARDIOVERTER DEFIBRILLATOR IMPLANT;  Surgeon: Marinus Maw, MD;  Location: [facility] CATH LAB;  Service: Cardiovascular;  Laterality: N/A;   LEFT HEART CATH AND CORONARY ANGIOGRAPHY N/A 08/24/2016   Procedure: LEFT HEART CATH AND CORONARY ANGIOGRAPHY;  Surgeon: Iran Ouch, MD;  Location: [facility] INVASIVE CV LAB;  Service: Cardiovascular;  Laterality: N/A;   TEE WITHOUT  CARDIOVERSION N/A 08/21/2014   Procedure: TRANSESOPHAGEAL ECHOCARDIOGRAM (TEE);  Surgeon: Chilton Si, MD;  Location: [facility] ENDOSCOPY;  Service: Cardiovascular;  Laterality: N/A;   TUBAL LIGATION  ~ 1978   Social History:  reports that she has never smoked. She has never used smokeless tobacco. She reports that she does not drink alcohol and does not use drugs. Family History:  Family History  Problem Relation Age of Onset   Heart disease Mother    Heart disease Father    Lung disease Sister    Diabetes Sister    Heart failure Brother    Thyroid disease Neg Hx      HOME MEDICATIONS: Allergies as of 07/24/2021   No Known Allergies      Medication List        Accurate as of July 24, 2021  7:17 AM. If you have any questions, ask your nurse or doctor.          amiodarone 200 MG tablet Commonly known as: PACERONE Take 1 tablet by mouth once daily   atorvastatin 40 MG tablet Commonly known as: LIPITOR TAKE 1 TABLET BY MOUTH  DAILY   clopidogrel 75 MG tablet Commonly known as: PLAVIX TAKE 1 TABLET BY MOUTH  DAILY   Eliquis 5 MG Tabs tablet Generic drug: apixaban Take 1 tablet by mouth twice daily   Entresto 24-26 MG Generic drug: sacubitril-valsartan Take 1 tablet by mouth 2 (two) times daily.   ezetimibe 10 MG tablet Commonly known as: ZETIA TAKE 1 TABLET BY MOUTH  DAILY   methimazole 5 MG tablet Commonly known as: TAPAZOLE Take 0.5 tablets (2.5 mg total) by mouth 3 (three) times a week.   propranolol ER 160 MG SR capsule Commonly known as: INDERAL LA TAKE 1 CAPSULE BY MOUTH  DAILY   spironolactone 25 MG tablet Commonly known as: ALDACTONE TAKE ONE-HALF TABLET BY  MOUTH DAILY   torsemide 20 MG tablet Commonly known as: DEMADEX TAKE 2 TABLETS BY MOUTH  DAILY          OBJECTIVE:   PHYSICAL EXAM: VS: There were no vitals taken for this visit.   EXAM: General: Pt appears well and is in NAD  Hydration: Well-hydrated with moist mucous  membranes and good skin turgor  Eyes: External eye exam normal without stare, lid lag or exophthalmos.  EOM intact.  PERRL.  Ears, Nose, Throat: Hearing: Grossly intact bilaterally Dental: Good dentition  Throat: Clear without mass, erythema or exudate  Neck: General: Supple without adenopathy. Thyroid: Thyroid size normal.  No goiter or nodules appreciated. No thyroid bruit.  Lungs: Clear with good BS bilat with no rales, rhonchi, or wheezes  Heart: Auscultation: RRR.  Abdomen: Normoactive bowel sounds, soft, nontender, without masses or organomegaly palpable  Extremities: Gait and station: Normal gait  Digits and nails: No clubbing, cyanosis, petechiae, or nodes Head and neck: Normal alignment and mobility BL UE: Normal ROM and strength. BL LE: No pretibial  edema normal ROM and strength.  Skin: Hair: Texture and amount normal with gender appropriate distribution Skin Inspection: No rashes, acanthosis nigricans/skin tags. No lipohypertrophy Skin Palpation: Skin temperature, texture, and thickness normal to palpation  Neuro: Cranial nerves: II - XII grossly intact  Cerebellar: Normal coordination and movement; no tremor Motor: Normal strength throughout DTRs: 2+ and symmetric in UE without delay in relaxation phase  Mental Status: Judgment, insight: Intact Orientation: Oriented to time, place, and person Memory: Intact for recent and remote events Mood and affect: No depression, anxiety, or agitation     DATA REVIEWED: ***    ASSESSMENT / PLAN / RECOMMENDATIONS:   Amiodarone-induced thyroid disease  Plan: ***    Medications   ***   Signed electronically by: Lyndle Herrlich, MD  [facility] End[facility]ology  [facility] [address] [place] [place], [place] [address] Phone: [phone] FAX: [phone]      CC: Cain Saupe, MD [address], [address] [place] [address] Phone: [phone]  Fax: [phone]   Return to  Endocrinology clinic as below: Future Appointments  Date Time Provider Department Center  07/24/2021 12:50 PM Roshan Roback, Konrad Dolores, MD [facility] None  08/11/2021  2:00 PM Noemi Chapel, NP [facility] None  08/14/2021 11:30 AM [facility] [place] [facility]  09/16/2021 11:30 AM [facility] DEVICE REMOTES [facility]  12/16/2021 11:30 AM [facility] DEVICE REMOTES [facility]  03/17/2022 11:30 AM [facility] DEVICE REMOTES [facility]

## 2021-07-29 ENCOUNTER — Other Ambulatory Visit: Payer: Self-pay | Admitting: Cardiovascular Disease

## 2021-08-07 ENCOUNTER — Ambulatory Visit: Payer: Medicare Other | Admitting: Internal Medicine

## 2021-08-11 ENCOUNTER — Ambulatory Visit (INDEPENDENT_AMBULATORY_CARE_PROVIDER_SITE_OTHER): Payer: Medicare Other | Admitting: Nurse Practitioner

## 2021-08-11 ENCOUNTER — Encounter: Payer: Self-pay | Admitting: Nurse Practitioner

## 2021-08-11 VITALS — BP 122/68 | HR 70 | Temp 97.6°F | Ht 60.0 in | Wt 214.4 lb

## 2021-08-11 DIAGNOSIS — G4731 Primary central sleep apnea: Secondary | ICD-10-CM

## 2021-08-11 DIAGNOSIS — G4733 Obstructive sleep apnea (adult) (pediatric): Secondary | ICD-10-CM | POA: Diagnosis not present

## 2021-08-11 NOTE — Patient Instructions (Signed)
Continue to use BiPAP every night, minimum of 4-6 hours a night.  Change equipment every 30 days or as directed by DME. Wash your tubing with warm soap and water daily, hang to dry. Wash humidifier portion weekly.  Be aware of reduced alertness and do not drive or operate heavy machinery if experiencing this or drowsiness.  Exercise encouraged, as tolerated. Avoid or decrease alcohol consumption and medications that make you more sleepy, if possible. Notify if persistent daytime sleepiness occurs even with consistent use of CPAP.  Order sent to medical supply company to adjust your pressure settings to IPAP max 18, EPAP minimum 8, and PS 5   Follow up in 4-6 weeks with Dr. Craige Cotta. If symptoms do not improve or worsen, please contact office for sooner follow up or seek emergency care.

## 2021-08-11 NOTE — Progress Notes (Signed)
@Patient  ID: , female    DOB: [birth year], 76 y.o.   MRN: [id number]  Chief Complaint  Patient presents with   Follow-up    Patient has no complaints.     Referring provider: [id number], MD  HPI: 76 year old female, never smoker followed for complex sleep apnea syndrome on BiPAP.  She is a former patient of Dr. 73 and last seen in office on 02/24/2021 by 02/26/2021 NP.  Past medical history significant for A-fib on Eliquis, NICM status post ICD, pulmonary hypertension, hypertension, systolic CHF, hypothyroidism, CKD.  TEST/EVENTS:  2006 NPSG: AHI 77/h with complex apnea 2007 titration: Failed CPAP, bilevel 22/17 (best balance for control of obstructive and central apneas) August 2022 titration: Unsuccessful; central apneas persistent on 18/13.  Suggested patient be tried on auto bilevel EPAP 8, PS 5, IPAP max 20.   02/24/2021: OV with 02/26/2021 NP for 6 to 32-month follow-up.  During last visit, patient was not consistently using her BiPAP machine as it was broken.  She underwent a unsuccessful titration study in August 2022, central apneas persistent on 18/13.  Suggested patient be tried on auto bilevel EPAP 8, PS 5, IPAP max 20.  If centrals persistent on BiPAP may be limited since LVEF is low and ASV mode contraindicated.  During this OV, she reported she was doing well with no acute complaints.  Her PAP machine had gotten switched to CPAP mode with set pressure of 14 cmH2O; residual AHI 6.6 with average CAI 3.3.  Order sent to change PAP machine back to BiPAP auto with minimum EPAP 8, IPAP max 20 and PS 5.  Follow-up 3 to 6 months with Dr. 04-02-1995.  08/11/2021: Today -follow-up Patient presents today for follow-up.  She has no concerns or complaints today.  Feels like her BiPAP is doing well for her.  She sleeps 7 to 9 hours a night and feels rested when she wakes up in the morning.  Denies any morning headaches.  She does not drive.  She is awaiting echocardiogram, which is scheduled  for Thursday, and then has follow-up with cardiology.  05/11/2021-08/08/2021 Auto bilevel EPAP minimum 8, pressure support 4, IPAP minimum 13 and max 20 88/90 days used; 97.8% >4 hr; average usage 7 hours 59 minutes Mean pressure 16.1/11.1 Average P90 518.5/13.5 Average high leak time 6 minutes at 2.8 lpm Average AHI 12.1 Average CAI 5.6  No Known Allergies  Immunization History  Administered Date(s) Administered   Influenza,inj,Quad PF,6+ Mos 12/04/2016, 12/07/2017   Influenza,inj,quad, With Preservative 10/05/2017    Past Medical History:  Diagnosis Date   Anginal pain (HCC)    Automatic implantable cardioverter-defibrillator in situ    a. s/p MDT ICD 07/2012; b. SN # PJN 08/2012    CAD (coronary artery disease)    a. cath 2003: LM nl, mid LAD 50%, LCx nl, RCA nl b. L&RHC 08/17/2014 100% occluded mid LCx, 70% mid LAD with FFR 0.82. Medical therapy. CI 2.1. CO 4.01c. LHC 8/18: CTO LCx w/ R-L colatts, LAD s/p PCI/DES x 2   Chronic systolic CHF (congestive heart failure) (HCC)    a. echo 2014: EF 25%, diffuse HK, LA moderately dilated, RA mildly dilated, PASP 38 mm Hg; b. echo 08/2014: EF 25-30%, diffuse HK, RWMA cannot be excluded, mild MR, mild biatrial enlargement, RV mildly dilated, wall thickness nl, pacer wire/catheter noted, mild to mod TR, PASP high nl, c. TTE 8/18: EF 25-30%, diffuse HK, GR1DD, mod MR, mod dilated LA, nl RV sys fxn,  PASP 56   Encounter for long-term (current) use of anticoagulants    Exertional shortness of breath    H/O medication noncompliance    High cholesterol    Hypertension    NICM (nonischemic cardiomyopathy) (HCC)    a. s/p AICD 07/22/2012   Obesity    OSA on CPAP    "suppose to; not often" (07/22/2012)   Other "heavy-for-dates" infants    PAF (paroxysmal atrial fibrillation) (HCC)    a. on warfarin--> Eliquis 08/2014    Tobacco History: Social History   Tobacco Use  Smoking Status Never  Smokeless Tobacco Never   Counseling given: Not  Answered   Outpatient Medications Prior to Visit  Medication Sig Dispense Refill   amiodarone (PACERONE) 200 MG tablet Take 1 tablet by mouth once daily 90 tablet 0   apixaban (ELIQUIS) 5 MG TABS tablet Take 1 tablet by mouth twice daily 60 tablet 5   atorvastatin (LIPITOR) 40 MG tablet TAKE 1 TABLET BY MOUTH  DAILY 90 tablet 2   clopidogrel (PLAVIX) 75 MG tablet TAKE 1 TABLET BY MOUTH  DAILY 90 tablet 0   ENTRESTO 24-26 MG Take 1 tablet by mouth twice daily 180 tablet 3   ezetimibe (ZETIA) 10 MG tablet TAKE 1 TABLET BY MOUTH  DAILY 90 tablet 0   methimazole (TAPAZOLE) 5 MG tablet Take 0.5 tablets (2.5 mg total) by mouth 3 (three) times a week. 20 tablet 3   propranolol ER (INDERAL LA) 160 MG SR capsule TAKE 1 CAPSULE BY MOUTH  DAILY 90 capsule 0   spironolactone (ALDACTONE) 25 MG tablet TAKE ONE-HALF TABLET BY  MOUTH DAILY 45 tablet 3   torsemide (DEMADEX) 20 MG tablet TAKE 2 TABLETS BY MOUTH  DAILY 180 tablet 3   No facility-administered medications prior to visit.     Review of Systems:   Constitutional: No weight loss or gain, night sweats, fevers, chills, fatigue, or lassitude. CV:  No chest pain, orthopnea, PND, swelling in lower extremities, anasarca, dizziness, palpitations, syncope Resp: No shortness of breath with exertion or at rest. No excess mucus or change in color of mucus. No productive or non-productive. No hemoptysis. No wheezing.  No chest wall deformity Skin: No rash, lesions, ulcerations Neuro: No dizziness or lightheadedness.  Psych: No depression or anxiety. Mood stable.     Physical Exam:  BP 122/68 (BP Location: Right Arm, Patient Position: Sitting, Cuff Size: Normal)   Pulse 70   Temp 97.6 F (36.4 C) (Oral)   Ht 5' (1.524 m)   Wt 214 lb 6.4 oz (97.3 kg)   SpO2 93%   BMI 41.87 kg/m   GEN: Pleasant, interactive, well-appearing; obese; in no acute distress. HEENT:  Normocephalic and atraumatic. PERRLA. Sclera white. Nasal turbinates pink, moist and  patent bilaterally. No rhinorrhea present. Oropharynx pink and moist, without exudate or edema. No lesions, ulcerations, or postnasal drip.  NECK:  Supple w/ fair ROM.  CV: RRR, no m/r/g, no peripheral edema. Pulses intact, +2 bilaterally. No cyanosis, pallor or clubbing. PULMONARY:  Unlabored, regular breathing. Clear bilaterally A&P w/o wheezes/rales/rhonchi. No accessory muscle use. No dullness to percussion. GI: BS present and normoactive. Soft, non-tender to palpation.  MSK: No erythema, warmth or tenderness. Neuro: A/Ox3. No focal deficits noted.   Skin: Warm, no lesions or rashe Psych: Normal affect and behavior. Judgement and thought content appropriate.     Lab Results:  CBC    Component Value Date/Time   WBC 6.0 07/03/2021 1607   WBC  7.2 08/27/2020 0410   RBC 4.86 07/03/2021 1607   RBC 4.76 08/27/2020 0410   HGB 13.2 07/03/2021 1607   HCT 39.7 07/03/2021 1607   PLT 315 07/03/2021 1607   MCV 82 07/03/2021 1607   MCH 27.2 07/03/2021 1607   MCH 28.4 08/27/2020 0410   MCHC 33.2 07/03/2021 1607   MCHC 33.8 08/27/2020 0410   RDW 14.4 07/03/2021 1607   LYMPHSABS 2.4 08/27/2020 0410   LYMPHSABS 2.8 12/13/2018 1241   MONOABS 1.1 (H) 08/27/2020 0410   EOSABS 0.4 08/27/2020 0410   EOSABS 0.3 12/13/2018 1241   BASOSABS 0.1 08/27/2020 0410   BASOSABS 0.1 12/13/2018 1241    BMET    Component Value Date/Time   NA 139 09/10/2020 1005   K 4.2 09/10/2020 1005   CL 100 09/10/2020 1005   CO2 23 09/10/2020 1005   GLUCOSE 100 (H) 09/10/2020 1005   GLUCOSE 103 (H) 08/27/2020 0410   BUN 33 (H) 09/10/2020 1005   CREATININE 1.60 (H) 09/10/2020 1005   CALCIUM 9.2 09/10/2020 1005   GFRNONAA 28 (L) 08/27/2020 0410   GFRAA 53 (L) 12/06/2019 1112    BNP    Component Value Date/Time   BNP 322.1 (H) 08/27/2020 0410     Imaging:  No results found.       Latest Ref Rng & Units 01/26/2013   11:55 AM  PFT Results  FVC-Pre L 1.42   FVC-Predicted Pre % 79   FVC-Post L 1.38    FVC-Predicted Post % 76   Pre FEV1/FVC % % 73   Post FEV1/FCV % % 73   FEV1-Pre L 1.04   FEV1-Predicted Pre % 75   FEV1-Post L 1.01   DLCO uncorrected ml/min/mmHg 8.22   DLCO UNC% % 50   DLVA Predicted % 92   TLC L 2.71   TLC % Predicted % 65   RV % Predicted % 69     No results found for: "NITRICOXIDE"      Assessment & Plan:   Complex sleep apnea syndrome Complex sleep apnea; difficulties with managing due to emergence of central apneas with increased pressures. Today's download with breakthrough AHI 12.1, 5.6 CAI. No significant leaks. Concerned her IPAP max may be too high; we will adjust this down to 18 cmH2O. Reassess in 4-6 weeks and follow up scheduled with Dr. Craige Cotta. Echo scheduled for Thursday; systolic CHF and pulmonary HTN likely cause of central events. No sedating medications noted.   Patient Instructions  Continue to use BiPAP every night, minimum of 4-6 hours a night.  Change equipment every 30 days or as directed by DME. Wash your tubing with warm soap and water daily, hang to dry. Wash humidifier portion weekly.  Be aware of reduced alertness and do not drive or operate heavy machinery if experiencing this or drowsiness.  Exercise encouraged, as tolerated. Avoid or decrease alcohol consumption and medications that make you more sleepy, if possible. Notify if persistent daytime sleepiness occurs even with consistent use of CPAP.  Order sent to medical supply company to adjust your pressure settings to IPAP max 18, EPAP minimum 8, and PS 5   Follow up in 4-6 weeks with Dr. Craige Cotta. If symptoms do not improve or worsen, please contact office for sooner follow up or seek emergency care.     I spent 31 minutes of dedicated to the care of this patient on the date of this encounter to include pre-visit review of records, face-to-face time with the patient discussing  conditions above, post visit ordering of testing, clinical documentation with the electronic health  record, making appropriate referrals as documented, and communicating necessary findings to members of the patients care team.  Noemi Chapel, NP 08/11/2021  Pt aware and understands NP's role.

## 2021-08-11 NOTE — Assessment & Plan Note (Signed)
Complex sleep apnea; difficulties with managing due to emergence of central apneas with increased pressures. Today's download with breakthrough AHI 12.1, 5.6 CAI. No significant leaks. Concerned her IPAP max may be too high; we will adjust this down to 18 cmH2O. Reassess in 4-6 weeks and follow up scheduled with Dr. Craige Cotta. Echo scheduled for Thursday; systolic CHF and pulmonary HTN likely cause of central events. No sedating medications noted.   Patient Instructions  Continue to use BiPAP every night, minimum of 4-6 hours a night.  Change equipment every 30 days or as directed by DME. Wash your tubing with warm soap and water daily, hang to dry. Wash humidifier portion weekly.  Be aware of reduced alertness and do not drive or operate heavy machinery if experiencing this or drowsiness.  Exercise encouraged, as tolerated. Avoid or decrease alcohol consumption and medications that make you more sleepy, if possible. Notify if persistent daytime sleepiness occurs even with consistent use of CPAP.  Order sent to medical supply company to adjust your pressure settings to IPAP max 18, EPAP minimum 8, and PS 5   Follow up in 4-6 weeks with Dr. Craige Cotta. If symptoms do not improve or worsen, please contact office for sooner follow up or seek emergency care.

## 2021-08-11 NOTE — Progress Notes (Signed)
Reviewed and agree with assessment/plan.   Dannah Ryles, MD [facility] 08/11/2021, 3:18 PM Pager:  [phone]  

## 2021-08-14 ENCOUNTER — Ambulatory Visit (INDEPENDENT_AMBULATORY_CARE_PROVIDER_SITE_OTHER): Payer: Medicare Other

## 2021-08-14 DIAGNOSIS — I428 Other cardiomyopathies: Secondary | ICD-10-CM | POA: Diagnosis not present

## 2021-08-14 LAB — ECHOCARDIOGRAM COMPLETE
AR max vel: 1.93 cm2
AV Area VTI: 1.98 cm2
AV Area mean vel: 1.87 cm2
AV Mean grad: 3 mmHg
AV Peak grad: 5.1 mmHg
Ao pk vel: 1.13 m/s
Area-P 1/2: 5.38 cm2
Calc EF: 38.6 %
S' Lateral: 3.6 cm
Single Plane A2C EF: 36.9 %
Single Plane A4C EF: 38.8 %

## 2021-08-15 DIAGNOSIS — G4733 Obstructive sleep apnea (adult) (pediatric): Secondary | ICD-10-CM | POA: Diagnosis not present

## 2021-08-19 ENCOUNTER — Telehealth: Payer: Self-pay | Admitting: Emergency Medicine

## 2021-08-19 DIAGNOSIS — I5022 Chronic systolic (congestive) heart failure: Secondary | ICD-10-CM

## 2021-08-19 MED ORDER — TORSEMIDE 20 MG PO TABS: 20.0000 mg | ORAL_TABLET | Freq: Every day | ORAL | 3 refills | Status: DC

## 2021-08-19 MED ORDER — EMPAGLIFLOZIN 10 MG PO TABS: 10.0000 mg | ORAL_TABLET | Freq: Every day | ORAL | 3 refills | Status: DC

## 2021-08-19 NOTE — Addendum Note (Signed)
Addended by: Vergia Alberts on: 08/19/2021 10:22 AM   Modules accepted: Orders

## 2021-08-19 NOTE — Telephone Encounter (Signed)
Called patient to go over results and recommendations. No answer. Lmtcb.

## 2021-08-19 NOTE — Telephone Encounter (Signed)
Spoke with patient. Went over results and recommendations. Patient will:   - start Jardiance 10 mg daily  - decrease torsemide to 20 mg daily  - go to [facility] in 1 month for BMP   Pt verbalized understanding of everything discussed and voiced appreciation for the call.   Rx for Jardiance sent in and tordesmide dose updated on med list. BMP order placed.

## 2021-08-19 NOTE — Telephone Encounter (Signed)
-----   Message from Antonieta Iba, MD sent at 08/17/2021 11:51 AM EDT ----- Echocardiogram Ejection fraction estimated 35 to 40% Improvement in function compared to 2018 though still depressed For would recommend we start Jardiance 10 mg daily Once she starts on the medication would decrease torsemide down to 20 mg daily, down from 40 Once on the medication, needs BMP in 1 month after Jardiance start

## 2021-08-21 DIAGNOSIS — G4733 Obstructive sleep apnea (adult) (pediatric): Secondary | ICD-10-CM | POA: Diagnosis not present

## 2021-09-04 ENCOUNTER — Other Ambulatory Visit: Payer: Self-pay | Admitting: Cardiovascular Disease

## 2021-09-11 DIAGNOSIS — H5203 Hypermetropia, bilateral: Secondary | ICD-10-CM | POA: Diagnosis not present

## 2021-09-11 DIAGNOSIS — H25813 Combined forms of age-related cataract, bilateral: Secondary | ICD-10-CM | POA: Diagnosis not present

## 2021-09-11 DIAGNOSIS — E119 Type 2 diabetes mellitus without complications: Secondary | ICD-10-CM | POA: Diagnosis not present

## 2021-09-11 DIAGNOSIS — H532 Diplopia: Secondary | ICD-10-CM | POA: Diagnosis not present

## 2021-09-15 DIAGNOSIS — G4733 Obstructive sleep apnea (adult) (pediatric): Secondary | ICD-10-CM | POA: Diagnosis not present

## 2021-09-16 ENCOUNTER — Ambulatory Visit: Payer: Medicare Other | Admitting: Pulmonary Disease

## 2021-09-16 ENCOUNTER — Ambulatory Visit (INDEPENDENT_AMBULATORY_CARE_PROVIDER_SITE_OTHER): Payer: Medicare Other

## 2021-09-16 DIAGNOSIS — I5022 Chronic systolic (congestive) heart failure: Secondary | ICD-10-CM

## 2021-09-17 DIAGNOSIS — H532 Diplopia: Secondary | ICD-10-CM | POA: Diagnosis not present

## 2021-09-17 DIAGNOSIS — H2513 Age-related nuclear cataract, bilateral: Secondary | ICD-10-CM | POA: Diagnosis not present

## 2021-09-17 DIAGNOSIS — H25013 Cortical age-related cataract, bilateral: Secondary | ICD-10-CM | POA: Diagnosis not present

## 2021-09-19 ENCOUNTER — Other Ambulatory Visit: Payer: Self-pay | Admitting: Internal Medicine

## 2021-09-19 LAB — CUP PACEART REMOTE DEVICE CHECK
Battery Remaining Longevity: 9 mo
Battery Voltage: 2.85 V
Brady Statistic AP VP Percent: 0 %
Brady Statistic AP VS Percent: 1.38 %
Brady Statistic AS VP Percent: 0.04 %
Brady Statistic AS VS Percent: 98.58 %
Brady Statistic RA Percent Paced: 1.38 %
Brady Statistic RV Percent Paced: 0.04 %
Date Time Interrogation Session: 20230912134143
HighPow Impedance: 81 Ohm
Implantable Lead Implant Date: 20140718
Implantable Lead Implant Date: 20140718
Implantable Lead Location: 753859
Implantable Lead Location: 753860
Implantable Lead Model: 5076
Implantable Lead Model: 6935
Implantable Pulse Generator Implant Date: 20140718
Lead Channel Impedance Value: 342 Ohm
Lead Channel Impedance Value: 399 Ohm
Lead Channel Impedance Value: 399 Ohm
Lead Channel Pacing Threshold Amplitude: 0.5 V
Lead Channel Pacing Threshold Amplitude: 0.625 V
Lead Channel Pacing Threshold Pulse Width: 0.4 ms
Lead Channel Pacing Threshold Pulse Width: 0.4 ms
Lead Channel Sensing Intrinsic Amplitude: 10.875 mV
Lead Channel Sensing Intrinsic Amplitude: 10.875 mV
Lead Channel Sensing Intrinsic Amplitude: 3.375 mV
Lead Channel Sensing Intrinsic Amplitude: 3.375 mV
Lead Channel Setting Pacing Amplitude: 2 V
Lead Channel Setting Pacing Amplitude: 2.5 V
Lead Channel Setting Pacing Pulse Width: 0.4 ms
Lead Channel Setting Sensing Sensitivity: 0.3 mV

## 2021-09-22 DIAGNOSIS — I272 Pulmonary hypertension, unspecified: Secondary | ICD-10-CM | POA: Diagnosis not present

## 2021-09-22 DIAGNOSIS — E0590 Thyrotoxicosis, unspecified without thyrotoxic crisis or storm: Secondary | ICD-10-CM | POA: Diagnosis not present

## 2021-09-22 DIAGNOSIS — Z1211 Encounter for screening for malignant neoplasm of colon: Secondary | ICD-10-CM | POA: Diagnosis not present

## 2021-09-22 DIAGNOSIS — Z1239 Encounter for other screening for malignant neoplasm of breast: Secondary | ICD-10-CM | POA: Diagnosis not present

## 2021-09-22 DIAGNOSIS — R195 Other fecal abnormalities: Secondary | ICD-10-CM | POA: Diagnosis not present

## 2021-09-22 DIAGNOSIS — H539 Unspecified visual disturbance: Secondary | ICD-10-CM | POA: Diagnosis not present

## 2021-09-22 DIAGNOSIS — Z0289 Encounter for other administrative examinations: Secondary | ICD-10-CM | POA: Diagnosis not present

## 2021-09-24 ENCOUNTER — Telehealth: Payer: Self-pay | Admitting: Cardiovascular Disease

## 2021-09-24 NOTE — Telephone Encounter (Signed)
I did not need this encounter. °

## 2021-09-29 ENCOUNTER — Other Ambulatory Visit
Admission: RE | Admit: 2021-09-29 | Discharge: 2021-09-29 | Disposition: A | Discharge status: Home / Self Care | Payer: Medicare Other | Attending: Cardiovascular Disease | Admitting: Cardiovascular Disease

## 2021-09-29 DIAGNOSIS — I5022 Chronic systolic (congestive) heart failure: Secondary | ICD-10-CM | POA: Insufficient documentation

## 2021-09-29 LAB — BASIC METABOLIC PANEL
Anion gap: 8 (ref 5–15)
BUN: 25 mg/dL — ABNORMAL HIGH (ref 8–23)
CO2: 28 mmol/L (ref 22–32)
Calcium: 9 mg/dL (ref 8.9–10.3)
Chloride: 107 mmol/L (ref 98–111)
Creatinine, Ser: 1.64 mg/dL — ABNORMAL HIGH (ref 0.44–1.00)
GFR, Estimated: 32 mL/min — ABNORMAL LOW (ref >60)
Glucose, Bld: 104 mg/dL — ABNORMAL HIGH (ref 70–99)
Potassium: 3.8 mmol/L (ref 3.5–5.1)
Sodium: 143 mmol/L (ref 135–145)

## 2021-10-02 DIAGNOSIS — H268 Other specified cataract: Secondary | ICD-10-CM | POA: Diagnosis not present

## 2021-10-02 DIAGNOSIS — H25012 Cortical age-related cataract, left eye: Secondary | ICD-10-CM | POA: Diagnosis not present

## 2021-10-02 DIAGNOSIS — H2512 Age-related nuclear cataract, left eye: Secondary | ICD-10-CM | POA: Diagnosis not present

## 2021-10-02 DIAGNOSIS — H269 Unspecified cataract: Secondary | ICD-10-CM | POA: Diagnosis not present

## 2021-10-02 NOTE — Progress Notes (Signed)
Remote ICD transmission.   

## 2021-10-13 ENCOUNTER — Other Ambulatory Visit: Payer: Self-pay | Admitting: Cardiovascular Disease

## 2021-10-13 NOTE — Telephone Encounter (Signed)
Rx refill sent to pharmacy. 

## 2021-10-15 DIAGNOSIS — G4733 Obstructive sleep apnea (adult) (pediatric): Secondary | ICD-10-CM | POA: Diagnosis not present

## 2021-10-21 ENCOUNTER — Encounter: Payer: Self-pay | Admitting: Internal Medicine

## 2021-10-21 ENCOUNTER — Ambulatory Visit (INDEPENDENT_AMBULATORY_CARE_PROVIDER_SITE_OTHER): Payer: Medicare Other | Admitting: Internal Medicine

## 2021-10-21 VITALS — BP 120/82 | HR 61 | Ht 60.0 in | Wt 209.4 lb

## 2021-10-21 DIAGNOSIS — E0590 Thyrotoxicosis, unspecified without thyrotoxic crisis or storm: Secondary | ICD-10-CM | POA: Diagnosis not present

## 2021-10-21 NOTE — Progress Notes (Signed)
Patient ID: Erin [name], female   DOB: [birth year], 76 y.o.   MRN: [id number]  HPI  Erin [name] is a 76 y.o.-year-old female, returning for follow-up for h/o amiodarone-induced thyrotoxicosis.  She previously saw Dr. Everardo [name], last visit 7 months ago.  I reviewed patient's chart and also Dr. George [name] last note: Patient was on amiodarone between 2014-2020.  This was restarted in 2022 at 200 mg daily.  She currently continues on this dose.  TFTs were fluctuating while on amiodarone, starting in 2015.  She was initially on methimazole, then came off and then restarted in 2018 and stopped in 2019.  She started levothyroxine in 2020 but stopped in 12/2018 and started back on methimazole in titration for cardioversion in 01/2019.  Methimazole was again stopped in 09/2019 due to hypothyroidism and restarted in 12/2019.  At last visit with Dr. Everardo [name], in 03/2021, she was on methimazole 2.5 mg 3x a week >> this was stopped as a TSH was found to be 14.4.  Latest TSH obtained 3 months later was improved, but still abnormal, at 7.4.  Patiently is currently not on thyroid medication.  She is on Inderal LA 160 mg daily.  I reviewed pt's thyroid tests: Lab Results  Component Value Date   TSH 7.400 (H) 07/03/2021   TSH 14.43 (H) 03/12/2021   TSH 6.87 (H) 12/11/2020   TSH 4.390 09/10/2020   TSH 6.080 (H) 06/18/2020   TSH 4.46 11/09/2019   TSH 75.28 (H) 09/26/2019   TSH 31.66 (H) 07/28/2019   TSH 2.07 05/29/2019   TSH 0.12 (L) 04/13/2019   FREET4 0.81 03/12/2021   FREET4 0.85 12/11/2020   FREET4 1.21 09/10/2020   FREET4 0.91 11/09/2019   FREET4 0.27 (L) 09/26/2019   FREET4 0.62 07/28/2019   FREET4 0.85 05/29/2019   FREET4 1.06 04/13/2019   FREET4 1.05 03/13/2019   FREET4 1.21 02/14/2019   T3FREE 3.2 12/11/2020   T3FREE 1.8 (L) 09/10/2020   T3FREE 3.9 01/24/2019   T3FREE 3.9 01/10/2019   T3FREE 3.1 12/23/2018   T3FREE 3.1 08/20/2016   Antithyroid antibodies: No results  found for: "TSI"  Pt denies: - feeling nodules in neck - hoarseness - dysphagia - choking - SOB with lying down  She denies: - fatigue - excessive sweating/heat intolerance - tremors - anxiety - palpitations - hyperdefecation  She has: - weight gain  Pt does not have a FH of thyroid ds. No FH of thyroid cancer. No h/o radiation tx to head or neck. No recent contrast studies. No steroid use. No herbal supplements. No Biotin use.  Pt. also has a history of CAD, NICM, sCHF, atrial fibrillation, HTN, history of medication noncompliance.  ROS: + see HPI  Past Medical History:  Diagnosis Date   Anginal pain (HCC)    Automatic implantable cardioverter-defibrillator in situ    a. s/p MDT ICD 07/2012; b. SN # [id number]    CAD (coronary artery disease)    a. cath 2003: LM nl, mid LAD 50%, LCx nl, RCA nl b. L&RHC 08/17/2014 100% occluded mid LCx, 70% mid LAD with FFR 0.82. Medical therapy. CI 2.1. CO 4.01c. LHC 8/18: CTO LCx w/ R-L colatts, LAD s/p PCI/DES x 2   Chronic systolic CHF (congestive heart failure) (HCC)    a. echo 2014: EF 25%, diffuse HK, LA moderately dilated, RA mildly dilated, PASP 38 mm Hg; b. echo 08/2014: EF 25-30%, diffuse HK, RWMA cannot be excluded, mild MR, mild biatrial enlargement, RV mildly dilated, wall thickness  nl, pacer wire/catheter noted, mild to mod TR, PASP high nl, c. TTE 8/18: EF 25-30%, diffuse HK, GR1DD, mod MR, mod dilated LA, nl RV sys fxn, PASP 56   Encounter for long-term (current) use of anticoagulants    Exertional shortness of breath    H/O medication noncompliance    High cholesterol    Hypertension    NICM (nonischemic cardiomyopathy) (HCC)    a. s/p AICD 07/22/2012   Obesity    OSA on CPAP    "suppose to; not often" (07/22/2012)   Other "heavy-for-dates" infants    PAF (paroxysmal atrial fibrillation) (HCC)    a. on warfarin--> Eliquis 08/2014   Past Surgical History:  Procedure Laterality Date   ABDOMINAL HYSTERECTOMY  ~ 1998    "laparoscopic" (07/22/2012)   CARDIAC CATHETERIZATION  ? 1990's   CARDIAC CATHETERIZATION N/A 08/17/2014   Procedure: Right/Left Heart Cath and Coronary Angiography;  Surgeon: Kathleene Hazel, MD;  Location: [facility] INVASIVE CV LAB;  Service: Cardiovascular;  Laterality: N/A;   CARDIAC DEFIBRILLATOR PLACEMENT  07/22/2012   dual-chamber ICD.   CARDIOVERSION N/A 01/13/2019   Procedure: CARDIOVERSION;  Surgeon: Antonieta Iba, MD;  Location: [facility] ORS;  Service: Cardiovascular;  Laterality: N/A;   CORONARY STENT INTERVENTION N/A 08/24/2016   Procedure: CORONARY STENT INTERVENTION;  Surgeon: Iran Ouch, MD;  Location: [facility] INVASIVE CV LAB;  Service: Cardiovascular;  Laterality: N/A;   IMPLANTABLE CARDIOVERTER DEFIBRILLATOR IMPLANT N/A 07/22/2012   Procedure: IMPLANTABLE CARDIOVERTER DEFIBRILLATOR IMPLANT;  Surgeon: Marinus Maw, MD;  Location: [facility] CATH LAB;  Service: Cardiovascular;  Laterality: N/A;   LEFT HEART CATH AND CORONARY ANGIOGRAPHY N/A 08/24/2016   Procedure: LEFT HEART CATH AND CORONARY ANGIOGRAPHY;  Surgeon: Iran Ouch, MD;  Location: [facility] INVASIVE CV LAB;  Service: Cardiovascular;  Laterality: N/A;   TEE WITHOUT CARDIOVERSION N/A 08/21/2014   Procedure: TRANSESOPHAGEAL ECHOCARDIOGRAM (TEE);  Surgeon: Chilton Si, MD;  Location: [facility] ENDOSCOPY;  Service: Cardiovascular;  Laterality: N/A;   TUBAL LIGATION  ~ 1978   Social History   Socioeconomic History   Marital status: Single    Spouse name: Not on file   Number of children: 3   Years of education: Not on file   Highest education level: Not on file  Occupational History   Occupation: Retired    Associate Professor: [employer]  Tobacco Use   Smoking status: Never   Smokeless tobacco: Never  Vaping Use   Vaping Use: Never used  Substance and Sexual Activity   Alcohol use: No   Drug use: No   Sexual activity: Never  Other Topics Concern   Not on file  Social History Narrative   Not on file   Social Determinants  of Health   Financial Resource Strain: Not on file  Food Insecurity: Not on file  Transportation Needs: Not on file  Physical Activity: Not on file  Stress: Not on file  Social Connections: Not on file  Intimate Partner Violence: Not on file   Current Outpatient Medications on File Prior to Visit  Medication Sig Dispense Refill   amiodarone (PACERONE) 200 MG tablet Take 1 tablet by mouth once daily 90 tablet 0   apixaban (ELIQUIS) 5 MG TABS tablet Take 1 tablet by mouth twice daily 60 tablet 5   atorvastatin (LIPITOR) 40 MG tablet TAKE 1 TABLET BY MOUTH  DAILY 90 tablet 2   clopidogrel (PLAVIX) 75 MG tablet TAKE 1 TABLET BY MOUTH DAILY 90 tablet 2   empagliflozin (JARDIANCE) 10  MG TABS tablet Take 1 tablet (10 mg total) by mouth daily before breakfast. 30 tablet 3   ENTRESTO 24-26 MG Take 1 tablet by mouth twice daily 180 tablet 3   ezetimibe (ZETIA) 10 MG tablet TAKE 1 TABLET BY MOUTH DAILY 90 tablet 2   methimazole (TAPAZOLE) 5 MG tablet Take 0.5 tablets (2.5 mg total) by mouth 3 (three) times a week. 20 tablet 3   propranolol ER (INDERAL LA) 160 MG SR capsule TAKE 1 CAPSULE BY MOUTH DAILY 90 capsule 2   spironolactone (ALDACTONE) 25 MG tablet TAKE ONE-HALF TABLET BY  MOUTH DAILY 45 tablet 3   torsemide (DEMADEX) 20 MG tablet Take 1 tablet (20 mg total) by mouth daily. 90 tablet 3   No current facility-administered medications on file prior to visit.   No Known Allergies Family History  Problem Relation Age of Onset   Heart disease Mother    Heart disease Father    Lung disease Sister    Diabetes Sister    Heart failure Brother    Thyroid disease Neg Hx    PE: BP 120/82 (BP Location: Right Arm, Patient Position: Sitting, Cuff Size: Normal)   Pulse 61   Ht 5' (1.524 m)   Wt 209 lb 6.4 oz (95 kg)   SpO2 94%   BMI 40.90 kg/m  Wt Readings from Last 3 Encounters:  10/21/21 209 lb 6.4 oz (95 kg)  08/11/21 214 lb 6.4 oz (97.3 kg)  07/14/21 209 lb (94.8 kg)    Constitutional: overweight, in NAD, uses a walker Eyes:no exophthalmos, no lid lag, no stare  ENT: no thyromegaly, no thyroid bruits, no cervical lymphadenopathy Cardiovascular: RRR, No MRG Respiratory: CTA B Musculoskeletal: no deformities Skin: moist, warm, no rashes Neurological: no tremor with outstretched hands  ASSESSMENT: 1.  History of amiodarone induced thyrotoxicosis -Fluctuating TFTs over the years  PLAN:  1. Patient with a history of widely fluctuating TFTs, previously on both methimazole and levothyroxine.  It is unclear why her TFTs fluctuate to this extent, since this is not completely expected from her diagnosis of amiodarone-induced thyrotoxicosis.  I was not able to find anti-TPO, ATA, or TSI antibodies in the chart.  It is possible that she has elevated TRAb antibodies with intermittently stimulating and suppressing antibodies.  -She denies weight loss, heat intolerance, hyperdefecation, palpitations, anxiety.  - she does not appear to have exogenous causes for the low TSH.  - We discussed that possible causes of thyrotoxicosis are:  Amiodarone induced thyrotoxicosis -possibly the original etiology Graves ds  -no family history of thyroid disease Subacute thyroiditis -this will be difficult to differentiate from amiodarone induced thyrotoxicosis Toxic multinodular goiter/ toxic adenoma -I cannot feel nodules at palpation of her thyroid - will check the TSH, fT3 and fT4 and also add TPO, ATA antibodies along with TRAb and thyroid stimulating antibodies to screen for Hashimoto's thyroiditis and Graves' disease.  - If the tests remain abnormal, we may attempt to do an uptake and scan (usually done to differentiate between the 3 above possible etiologies), however, in the setting of amiodarone treatment, this will likely not be conclusive.  This test is rarely used in patients on amiodarone, but if the uptake is detectable, rather than suppressed, type 1 AIT is favored  (thyroid hormone overproduction) as opposed to type 2 AIT (thyroiditis with thyroid hormone leakage into circulation).  Also, if she has a detectable uptake, she may be eligible for RAI treatment.  If not, I she may benefit  from total thyroidectomy. -She agrees with the above plan. -She is already on beta-blockers, per cardiology - no signs of Graves' ophthalmopathy: no double vision, blurry vision, eye pain, chemosis. - I we will have her return in 4 months, but possibly sooner for labs  - Total time spent for the visit: 40 min, in precharting, reviewing Dr. George [name] last note, obtaining medical information from the chart and from the pt, reviewing her  previous labs, evaluations, and treatments, reviewing her symptoms, counseling her about her thyroid condition (please see the discussed topics above), and developing a plan to further treat it; she had a number of questions which I addressed.  Component     Latest Ref Rng 10/21/2021  TSH     0.35 - 5.50 uIU/mL 6.84 (H)   T4,Free(Direct)     0.60 - 1.60 ng/dL 0.53   Triiodothyronine,Free,Serum     2.3 - 4.2 pg/mL 2.7   Thyroperoxidase Ab SerPl-aCnc     <9 IU/mL 1   TSI     <140 % baseline 184 (H)   Thyroglobulin Ab     < or = 1 IU/mL <1   TRAB     <=2.00 IU/L 2.69 (H)   Patient with elevated TSI antibodies, confirming Graves' disease.  TSH is slightly better, only slightly above target. At this point, the picture appears to be 1 of improving Graves' disease.  Due to previous intolerance to levothyroxine and the fact that she does have atrial fibrillation, would be prone to keep the TSH high in the normal range and slightly above.  A TSH of 6.8 would not necessarily put her at higher risk for complications, due to age.  Therefore, for now, we will continue without treatment but I plan to recheck her thyroid function tests in approximately 2 months.  Carlus Pavlov, MD PhD [facility] Endocrinology

## 2021-10-21 NOTE — Patient Instructions (Addendum)
Please stop at the lab.  Please return for another visit in 4 months.

## 2021-10-22 LAB — TSH: TSH: 6.84 u[IU]/mL — ABNORMAL HIGH (ref 0.35–5.50)

## 2021-10-22 LAB — T4, FREE: Free T4: 0.95 ng/dL (ref 0.60–1.60)

## 2021-10-22 LAB — T3, FREE: T3, Free: 2.7 pg/mL (ref 2.3–4.2)

## 2021-10-23 LAB — THYROID STIMULATING IMMUNOGLOBULIN: TSI: 184 % baseline — ABNORMAL HIGH (ref <140)

## 2021-10-23 LAB — TRAB (TSH RECEPTOR BINDING ANTIBODY): TRAB: 2.69 IU/L — ABNORMAL HIGH (ref <=2.00)

## 2021-10-23 LAB — THYROGLOBULIN ANTIBODY: Thyroglobulin Ab: <1 IU/mL (ref <=1)

## 2021-10-23 LAB — THYROID PEROXIDASE ANTIBODY: Thyroperoxidase Ab SerPl-aCnc: 1 IU/mL (ref <9)

## 2021-11-06 DIAGNOSIS — H269 Unspecified cataract: Secondary | ICD-10-CM | POA: Diagnosis not present

## 2021-11-06 DIAGNOSIS — H25011 Cortical age-related cataract, right eye: Secondary | ICD-10-CM | POA: Diagnosis not present

## 2021-11-06 DIAGNOSIS — H2511 Age-related nuclear cataract, right eye: Secondary | ICD-10-CM | POA: Diagnosis not present

## 2021-11-06 DIAGNOSIS — H25811 Combined forms of age-related cataract, right eye: Secondary | ICD-10-CM | POA: Diagnosis not present

## 2021-11-26 ENCOUNTER — Other Ambulatory Visit: Payer: Self-pay | Admitting: Cardiovascular Disease

## 2021-12-12 DIAGNOSIS — Z961 Presence of intraocular lens: Secondary | ICD-10-CM | POA: Diagnosis not present

## 2021-12-16 ENCOUNTER — Ambulatory Visit (INDEPENDENT_AMBULATORY_CARE_PROVIDER_SITE_OTHER): Payer: Medicare Other

## 2021-12-16 DIAGNOSIS — I428 Other cardiomyopathies: Secondary | ICD-10-CM

## 2021-12-16 LAB — CUP PACEART REMOTE DEVICE CHECK
Battery Remaining Longevity: 6 mo
Battery Voltage: 2.83 V
Brady Statistic AP VP Percent: 0.01 %
Brady Statistic AP VS Percent: 4.38 %
Brady Statistic AS VP Percent: 0.04 %
Brady Statistic AS VS Percent: 95.57 %
Brady Statistic RA Percent Paced: 4.38 %
Brady Statistic RV Percent Paced: 0.04 %
Date Time Interrogation Session: 20231212125055
HighPow Impedance: 95 Ohm
Implantable Lead Connection Status: 753985
Implantable Lead Connection Status: 753985
Implantable Lead Implant Date: 20140718
Implantable Lead Implant Date: 20140718
Implantable Lead Location: 753859
Implantable Lead Location: 753860
Implantable Lead Model: 5076
Implantable Lead Model: 6935
Implantable Pulse Generator Implant Date: 20140718
Lead Channel Impedance Value: 399 Ohm
Lead Channel Impedance Value: 437 Ohm
Lead Channel Impedance Value: 437 Ohm
Lead Channel Pacing Threshold Amplitude: 0.5 V
Lead Channel Pacing Threshold Amplitude: 0.625 V
Lead Channel Pacing Threshold Pulse Width: 0.4 ms
Lead Channel Pacing Threshold Pulse Width: 0.4 ms
Lead Channel Sensing Intrinsic Amplitude: 15.75 mV
Lead Channel Sensing Intrinsic Amplitude: 15.75 mV
Lead Channel Sensing Intrinsic Amplitude: 3.5 mV
Lead Channel Sensing Intrinsic Amplitude: 3.5 mV
Lead Channel Setting Pacing Amplitude: 2 V
Lead Channel Setting Pacing Amplitude: 2.5 V
Lead Channel Setting Pacing Pulse Width: 0.4 ms
Lead Channel Setting Sensing Sensitivity: 0.3 mV
Zone Setting Status: 755011

## 2021-12-18 DIAGNOSIS — G4733 Obstructive sleep apnea (adult) (pediatric): Secondary | ICD-10-CM | POA: Diagnosis not present

## 2021-12-19 ENCOUNTER — Other Ambulatory Visit: Payer: Self-pay | Admitting: Internal Medicine

## 2021-12-19 ENCOUNTER — Other Ambulatory Visit: Payer: Self-pay | Admitting: Cardiovascular Disease

## 2021-12-19 NOTE — Telephone Encounter (Signed)
Please schedule 6 month F/U appointment for further refills. Thank you! 

## 2021-12-19 NOTE — Telephone Encounter (Signed)
Refill request

## 2022-01-15 NOTE — Progress Notes (Signed)
Remote ICD transmission.   

## 2022-01-16 ENCOUNTER — Other Ambulatory Visit: Payer: Self-pay | Admitting: Cardiovascular Disease

## 2022-01-18 NOTE — Progress Notes (Unsigned)
Cardiology Office Note    Date:  01/19/2022   ID:  [name], DOB [birth year], MRN [id number]  PCP:  Cain Saupe, MD  Cardiologist:  Julien Nordmann, MD  Electrophysiologist:  Sherryl Manges, MD   Chief Complaint: Follow-up  History of Present Illness:   JANE BIRKEL is a 77 y.o. 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  Prior cardiac cath in 2003 showed nonobstructive disease.  Echo in 2014 showed an EF of 25% at which time she underwent ICD implantation.  He was admitted in 2016 with acute on chronic CHF and A-fib with RVR.  She was transferred to [facility] with cardiac cath showing an occluded mid LCx, 70% mid LAD stenosis with an FFR of 0.82.  Echo at that time demonstrated an EF of 25% and enlargement of the left atrium.  Plans were for TEE guided DCCV, though TEE showed a left atrial thrombus with subsequent transition from Coumadin to apixaban.  Following this admission, she was lost to follow-up until she was admitted to the hospital in 08/2016 with an appropriate ICD shock outside the hospital for sustained VT.  She had recently ran out of medications leading up to presentation.  LHC during that admission showed significant two-vessel CAD with chronically occluded LCx with right to left collaterals along with significant progression of mid to proximal diffuse LAD disease.  She underwent successful PCI and 2 overlapping drug-eluting stents to the mid LAD extending into the proximal segment.  The proximal stent jailed a large first diagonal which had sluggish TIMI II flow.  Echo during that admission showed an EF of 25 to 30% with  diffuse hypokinesis, grade 1 diastolic dysfunction, moderate mitral regurgitation, moderately dilated left atrium, and an estimated PASP of 56 mmHg.  PYP scan in 2021 negative.  At her last visit with EP in 06/2021, they recommended continuing amiodarone 200 mg daily.  Most recent echo from 08/2021 showed an improvement in her cardiomyopathy with an EF of 35 to 40%, global hypokinesis, grade 2 diastolic dysfunction, normal RV systolic function and ventricular cavity size, PASP 48.4 mmHg, mildly to moderately dilated left atrium, mild mitral regurgitation, mild to moderate tricuspid regurgitation, and an estimated right atrial pressure of 3 mmHg.  Most recent device interrogation from 12/16/2021 showed an estimated longevity of 6 months  She comes in accompanied by her daughter today and is without symptoms of angina or decompensation.  Both patient and daughter continue to note longstanding upper and lower congestion with cough associated with clear sputum as well as continued to significant rhinorrhea.  Stable exertional dyspnea that is improved when ambulating with a walker.  Patient's daughter recalls a prior question diagnosis of COPD.  No lower extremity swelling, abdominal distention, progressive orthopnea, or early satiety.  No palpitations, device alarms or shocks.  No falls or symptoms concerning for bleeding.   Labs independently reviewed: 10/2021 - TSH 6.84, free T4 normal, free T3 normal 09/2021 - potassium 3.8, BUN 25, serum creatinine 1.64 06/2021 - Hgb 13.2, PLT 315, albumin 4.5, AST normal, ALT 33 08/2020 - magnesium 2.5 06/2019 - TC 158, TG 90, HDL 57, LDL 83  Past Medical History:  Diagnosis Date   Anginal pain ([place])    Automatic implantable cardioverter-defibrillator in situ    a. s/p MDT ICD 07/2012; b. SN # [id number]    CAD (coronary artery disease)    a. cath 2003: LM nl, mid LAD 50%, LCx nl, RCA nl b. L&RHC 08/17/2014 100% occluded mid LCx, 70% mid LAD with FFR 0.82. Medical  therapy. CI 2.1. CO 4.01c. LHC 8/18: CTO LCx w/ R-L colatts, LAD s/p PCI/DES x 2   Chronic systolic CHF (congestive heart failure) ([place])    a. echo 2014: EF 25%, diffuse HK, LA moderately dilated, RA mildly dilated, PASP 38 mm Hg; b. echo 08/2014: EF 25-30%, diffuse HK, RWMA cannot be excluded, mild MR, mild biatrial enlargement, RV mildly dilated, wall thickness nl, pacer wire/catheter noted, mild to mod TR, PASP high nl, c. TTE 8/18: EF 25-30%, diffuse HK, GR1DD, mod MR, mod dilated LA, nl RV sys fxn, PASP 56   Encounter for long-term (current) use of anticoagulants    Exertional shortness of breath    H/O medication noncompliance    High cholesterol    Hypertension    NICM (nonischemic cardiomyopathy) ([place])    a. s/p AICD 07/22/2012   Obesity    OSA on CPAP    "suppose to; not often" (07/22/2012)   Other "heavy-for-dates" infants    PAF (paroxysmal atrial fibrillation) ([place])    a. on warfarin--> Eliquis 08/2014    Past Surgical History:  Procedure Laterality Date   ABDOMINAL HYSTERECTOMY  ~ 1998   "laparoscopic" (07/22/2012)   CARDIAC CATHETERIZATION  ? 1990's   CARDIAC CATHETERIZATION N/A 08/17/2014   Procedure: Right/Left Heart Cath and Coronary Angiography;  Surgeon: Burnell Blanks, MD;  Location: [facility];  Service: Cardiovascular;  Laterality: N/A;   CARDIAC DEFIBRILLATOR PLACEMENT  07/22/2012   dual-chamber ICD.   CARDIOVERSION N/A 01/13/2019   Procedure: CARDIOVERSION;  Surgeon: Minna Merritts, MD;  Location: [facility] ORS;  Service: Cardiovascular;  Laterality: N/A;   CORONARY STENT INTERVENTION N/A 08/24/2016   Procedure: CORONARY STENT INTERVENTION;  Surgeon: Wellington Hampshire, MD;  Location: [place] [facility];  Service: Cardiovascular;  Laterality: N/A;   IMPLANTABLE CARDIOVERTER DEFIBRILLATOR IMPLANT N/A 07/22/2012   Procedure: IMPLANTABLE CARDIOVERTER DEFIBRILLATOR IMPLANT;  Surgeon: Evans Lance, MD;  Location: [facility] CATH LAB;  Service: Cardiovascular;   Laterality: N/A;   LEFT HEART CATH AND CORONARY ANGIOGRAPHY N/A 08/24/2016   Procedure: LEFT HEART CATH AND CORONARY ANGIOGRAPHY;  Surgeon: Wellington Hampshire, MD;  Location: [place] [facility];  Service: Cardiovascular;  Laterality: N/A;   TEE WITHOUT CARDIOVERSION N/A 08/21/2014   Procedure: TRANSESOPHAGEAL ECHOCARDIOGRAM (TEE);  Surgeon: Skeet Latch, MD;  Location[facility];  Service: Cardiovascular;  Laterality: N/A;   TUBAL LIGATION  ~ 1978    Current Medications: Current Meds  Medication Sig   amiodarone (PACERONE) 200 MG tablet Take 1 tablet by mouth once daily   apixaban (ELIQUIS) 5 MG TABS tablet Take 1 tablet by mouth twice daily   atorvastatin (LIPITOR) 40 MG tablet TAKE 1 TABLET BY MOUTH  DAILY   clopidogrel (PLAVIX) 75 MG tablet TAKE 1 TABLET BY MOUTH DAILY   empagliflozin (JARDIANCE) 10 MG TABS tablet TAKE 1 TABLET BY MOUTH ONCE DAILY BEFORE BREAKFAST   ENTRESTO 24-26 MG Take 1 tablet by mouth twice daily   ezetimibe (ZETIA) 10 MG tablet TAKE 1 TABLET BY MOUTH DAILY   propranolol ER (INDERAL LA) 160 MG SR capsule  TAKE 1 CAPSULE BY MOUTH DAILY   spironolactone (ALDACTONE) 25 MG tablet TAKE ONE-HALF TABLET BY MOUTH  DAILY   torsemide (DEMADEX) 20 MG tablet TAKE 2 TABLETS BY MOUTH DAILY (Patient taking differently: Take 20 mg by mouth daily.)    Allergies:   Patient has no known allergies.   Social History   Socioeconomic History   Marital status: Single    Spouse name: Not on file   Number of children: 3   Years of education: Not on file   Highest education level: Not on file  Occupational History   Occupation: Retired    Associate Professor: [employer]  Tobacco Use   Smoking status: Never   Smokeless tobacco: Never  Vaping Use   Vaping Use: Never used  Substance and Sexual Activity   Alcohol use: No   Drug use: No   Sexual activity: Never  Other Topics Concern   Not on file  Social History Narrative   Not on file   Social Determinants of Health    Financial Resource Strain: Not on file  Food Insecurity: Not on file  Transportation Needs: Not on file  Physical Activity: Not on file  Stress: Not on file  Social Connections: Not on file     Family History:  The patient's family history includes Diabetes in her sister; Heart disease in her father and mother; Heart failure in her brother; Lung disease in her sister. There is no history of Thyroid disease.  ROS:   12-point review of systems is negative unless otherwise noted in the HPI.   EKGs/Labs/Other Studies Reviewed:    Studies reviewed were summarized above. The additional studies were reviewed today:  2D echo 08/14/2021: 1. Left ventricular ejection fraction, by estimation, is 35 to 40%. Left  ventricular ejection fraction by 2D MOD biplane is 38.6 %. The left  ventricle has moderately decreased function. The left ventricle  demonstrates global hypokinesis. Left  ventricular diastolic parameters are consistent with Grade II diastolic  dysfunction (pseudonormalization). The average left ventricular global  longitudinal strain is -13.1 %. The global longitudinal strain is  abnormal.   2. Right ventricular systolic function is normal. The right ventricular  size is normal. There is moderately elevated pulmonary artery systolic  pressure. The estimated right ventricular systolic pressure is 48.4 mmHg.   3. Left atrial size was mild to moderately dilated.   4. The mitral valve is normal in structure. Mild mitral valve  regurgitation.   5. Tricuspid valve regurgitation is mild to moderate.   6. The aortic valve was not well visualized. Aortic valve regurgitation  is not visualized.   7. The inferior vena cava is normal in size with greater than 50%  respiratory variability, suggesting right atrial pressure of 3 mmHg.  __________  2D echo 11/06/2016: - Left ventricle: The cavity size was mildly dilated. Wall    thickness was normal. Systolic function was severely  reduced. The    estimated ejection fraction was in the range of 25% to 30%.    Features are consistent with a pseudonormal left ventricular    filling pattern, with concomitant abnormal relaxation and    increased filling pressure (grade 2 diastolic dysfunction).  - Mitral valve: There was moderate regurgitation.  - Left atrium: The atrium was mildly dilated.  - Tricuspid valve: There was mild regurgitation.  - Pulmonary arteries: Systolic pressure was severely increased. PA    peak pressure: 70 mm Hg (S).  __________  LHC 08/24/2016: Prox RCA lesion,  30 %stenosed. Post Atrio lesion, 25 %stenosed. Mid Cx lesion, 100 %stenosed. 1st Diag lesion, 40 %stenosed. A drug eluting stent was successfully placed. Prox LAD to Mid LAD lesion, 90 %stenosed. Post intervention, there is a 0% residual stenosis. LV end diastolic pressure is moderately elevated.   1. Significant 2 vessel coronary artery disease with chronically occluded left circumflex with right-to-left collaterals. Significant progression of mid to proximal diffuse LAD disease. 2. Moderately elevated left ventricular end-diastolic pressure. 3. Successful angioplasty and 2 overlapped drug-eluting stent placement to the mid LAD extending into the proximal segment. The proximal stent jailed a large first diagonal which had sluggish TIMI 2 flow. There was no significant ostial stenosis and most likely the sluggish flow was due to embolization.   Recommendations: the patient had mild chest tightness in the recovery area and EKG showed anterolateral ST depressions suggestive of global ischemia. I suspect that this is likely due to sluggish flow in the large diagonal branch. She was started on nitroglycerin drip. She was given one dose of IV furosemide and Foley catheter was placed. The patient needs to be admitted to the ICU overnight.  She also has productive cough.  Consider treatment with antibiotics. She needs to be in dual antiplatelet  therapy for at least one year. __________  Carlton Adam MPI 08/21/2016: Pharmacological myocardial perfusion imaging study with ISCHEMIA of moderate size, moderate perfusion defect in the mid to apical anterior wall Also with fixed defect in the basal to mid lateral wall. Normal wall motion, EF estimated at 24% No EKG changes concerning for ischemia at peak stress or in recovery. High risk scan Consider cardiac catheterization given known LAD disease by prior cardiac cath __________  2D echo 08/19/2016: - Left ventricle: The cavity size was mildly dilated. Systolic    function was severely reduced. The estimated ejection fraction    was in the range of 25% to 30%. Diffuse hypokinesis. Regional    wall motion abnormalities cannot be excluded. Doppler parameters    are consistent with abnormal left ventricular relaxation (grade 1    diastolic dysfunction).  - Mitral valve: There was moderate regurgitation.  - Left atrium: The atrium was moderately dilated.  - Right ventricle: Pacer wire or catheter noted in right ventricle.    Systolic function was normal.  - Right atrium: The atrium was mildly dilated.  - Tricuspid valve: There was mild-moderate regurgitation.  - Pulmonary arteries: Systolic pressure was moderately elevated. PA    peak pressure: 56 mm Hg (S).  __________  See CV studies in Epic for more remote studies  EKG:  EKG is ordered today.  The EKG ordered today demonstrates NSR, 64 bpm, first-degree AV block, low voltage QRS, nonspecific ST-T changes  Recent Labs: 10/21/2021: TSH 6.84 01/19/2022: ALT 76; BUN 32; Creatinine, Ser 1.68; Hemoglobin 13.9; Platelets 264; Potassium 4.2; Sodium 137  Recent Lipid Panel    Component Value Date/Time   CHOL 249 (H) 01/19/2022 1207   CHOL 154 05/02/2019 1218   TRIG 102 01/19/2022 1207   HDL 67 01/19/2022 1207   HDL 47 05/02/2019 1218   CHOLHDL 3.7 01/19/2022 1207   VLDL 20 01/19/2022 1207   LDLCALC 162 (H) 01/19/2022 1207   LDLCALC  87 05/02/2019 1218    PHYSICAL EXAM:    VS:  BP 114/60 (BP Location: Left Arm, Patient Position: Sitting, Cuff Size: Normal)   Pulse 64   Ht 5' (1.524 m)   Wt 210 lb (95.3 kg)   SpO2 94%  BMI 41.01 kg/m   BMI: Body mass index is 41.01 kg/m.  Physical Exam Vitals reviewed.  Constitutional:      Appearance: She is well-developed.  HENT:     Head: Normocephalic and atraumatic.  Eyes:     General:        Right eye: No discharge.        Left eye: No discharge.  Neck:     Vascular: No JVD.  Cardiovascular:     Rate and Rhythm: Normal rate and regular rhythm.     Pulses:          Posterior tibial pulses are 2+ on the right side and 2+ on the left side.     Heart sounds: Normal heart sounds, S1 normal and S2 normal. Heart sounds not distant. No midsystolic click and no opening snap. No murmur heard.    No friction rub.  Pulmonary:     Effort: Pulmonary effort is normal. No respiratory distress.     Breath sounds: Normal breath sounds. No decreased breath sounds, wheezing or rales.  Chest:     Chest wall: No tenderness.  Abdominal:     General: There is no distension.     Palpations: Abdomen is soft.     Tenderness: There is no abdominal tenderness.  Musculoskeletal:     Cervical back: Normal range of motion.     Right lower leg: No edema.     Left lower leg: No edema.  Skin:    General: Skin is warm and dry.     Nails: There is no clubbing.  Neurological:     Mental Status: She is alert and oriented to person, place, and time.  Psychiatric:        Speech: Speech normal.        Behavior: Behavior normal.        Thought Content: Thought content normal.        Judgment: Judgment normal.      Wt Readings from Last 3 Encounters:  01/19/22 210 lb (95.3 kg)  10/21/21 209 lb 6.4 oz (95 kg)  08/11/21 214 lb 6.4 oz (97.3 kg)     ASSESSMENT & PLAN:   CAD involving the native coronary arteries with stable angina: No symptoms suggestive of angina.  She remains on  clopidogrel, atorvastatin, ezetimibe, and propranolol.  No indication for further ischemic testing at this time.  HFrEF secondary to ICM with history of sustained VT status post ICD/pulmonary hypertension: She appears euvolemic and well compensated.  She remains on propranolol, spironolactone, Entresto, Jardiance, and torsemide.  Update limited echo to evaluate LV systolic function on recently started Jardiance to help guide escalation of GDMT, and in an effort to evaluate RVSP given prior pulmonary hypertension and in the context of chronic dyspnea.  Device is approaching RRT based on interrogation from 12/2021.  Schedule appointment with EP for further management.  She remains on amiodarone 200 mg daily, in the setting of prior sustained VT, with ongoing management per EP.  Medication surveillance labs at last check as noted above.  Check CMP, recent TSH obtained by endocrinology.  PAF: Maintaining sinus rhythm with propranolol, on amiodarone as well as outlined above given sustained VT.  CHA2DS2-VASc at least 6 (CHF, HTN, age x 2, vascular disease, sex category).  She remains on apixaban 5 mg twice daily and does not meet reduced dosing criteria.  Check CMP and CBC.  HTN: Blood pressure is well-controlled in the office today.  Continue medical therapy as  outlined above.  HLD: LDL 83 from 06/2019 with mildly elevated ALT in 06/2021.  Goal LDL less than 70.  Currently on atorvastatin 40 mg.  Check CMP, lipid panel, and direct LDL.  Amiodarone induced thyrotoxicosis: Followed by endocrinology.  On beta-blocker as outlined above.  Management of amiodarone per EP.  Chronic dyspnea: May benefit from PFTs.  Referral to pulmonology.  No acute pulmonary changes to suggest amiodarone toxicity.  Historically has been felt to be related to some degree of allergies.   Disposition: F/u with Dr. Mariah Milling or an APP in 6 months, and EP at next available appointment for discussion of generator replacement and ongoing  management of amiodarone.    Medication Adjustments/Labs and Tests Ordered: Current medicines are reviewed at length with the patient today.  Concerns regarding medicines are outlined above. Medication changes, Labs and Tests ordered today are summarized above and listed in the Patient Instructions accessible in Encounters.   Signed, Eula Listen, PA-C 01/19/2022 1:33 PM     [facility] [place] [address] [place], [place] [address] [phone]

## 2022-01-19 ENCOUNTER — Encounter: Payer: Self-pay | Admitting: Physician Assistant

## 2022-01-19 ENCOUNTER — Ambulatory Visit: Payer: Medicare Other | Attending: Physician Assistant | Admitting: Physician Assistant

## 2022-01-19 ENCOUNTER — Other Ambulatory Visit
Admission: RE | Admit: 2022-01-19 | Discharge: 2022-01-19 | Disposition: A | Discharge status: Home / Self Care | Payer: Medicare Other | Attending: Physician Assistant | Admitting: Physician Assistant

## 2022-01-19 VITALS — BP 114/60 | HR 64 | Ht 60.0 in | Wt 210.0 lb

## 2022-01-19 DIAGNOSIS — Z79899 Other long term (current) drug therapy: Secondary | ICD-10-CM

## 2022-01-19 DIAGNOSIS — I10 Essential (primary) hypertension: Secondary | ICD-10-CM

## 2022-01-19 DIAGNOSIS — I5020 Unspecified systolic (congestive) heart failure: Secondary | ICD-10-CM | POA: Insufficient documentation

## 2022-01-19 DIAGNOSIS — R0602 Shortness of breath: Secondary | ICD-10-CM | POA: Diagnosis not present

## 2022-01-19 DIAGNOSIS — I272 Pulmonary hypertension, unspecified: Secondary | ICD-10-CM | POA: Diagnosis not present

## 2022-01-19 DIAGNOSIS — Z95810 Presence of automatic (implantable) cardiac defibrillator: Secondary | ICD-10-CM | POA: Diagnosis not present

## 2022-01-19 DIAGNOSIS — I472 Ventricular tachycardia, unspecified: Secondary | ICD-10-CM

## 2022-01-19 DIAGNOSIS — I4819 Other persistent atrial fibrillation: Secondary | ICD-10-CM | POA: Diagnosis not present

## 2022-01-19 DIAGNOSIS — I25118 Atherosclerotic heart disease of native coronary artery with other forms of angina pectoris: Secondary | ICD-10-CM | POA: Insufficient documentation

## 2022-01-19 DIAGNOSIS — I480 Paroxysmal atrial fibrillation: Secondary | ICD-10-CM

## 2022-01-19 DIAGNOSIS — E785 Hyperlipidemia, unspecified: Secondary | ICD-10-CM | POA: Diagnosis not present

## 2022-01-19 DIAGNOSIS — I255 Ischemic cardiomyopathy: Secondary | ICD-10-CM | POA: Diagnosis not present

## 2022-01-19 DIAGNOSIS — R0609 Other forms of dyspnea: Secondary | ICD-10-CM | POA: Diagnosis not present

## 2022-01-19 DIAGNOSIS — I428 Other cardiomyopathies: Secondary | ICD-10-CM | POA: Diagnosis not present

## 2022-01-19 LAB — CBC
HCT: 43.4 % (ref 36.0–46.0)
Hemoglobin: 13.9 g/dL (ref 12.0–15.0)
MCH: 26.9 pg (ref 26.0–34.0)
MCHC: 32 g/dL (ref 30.0–36.0)
MCV: 84.1 fL (ref 80.0–100.0)
Platelets: 264 10*3/uL (ref 150–400)
RBC: 5.16 MIL/uL — ABNORMAL HIGH (ref 3.87–5.11)
RDW: 15.5 % (ref 11.5–15.5)
WBC: 5.9 10*3/uL (ref 4.0–10.5)
nRBC: 0 % (ref 0.0–0.2)

## 2022-01-19 LAB — LDL CHOLESTEROL, DIRECT: Direct LDL: 159 mg/dL — ABNORMAL HIGH (ref 0–99)

## 2022-01-19 LAB — COMPREHENSIVE METABOLIC PANEL
ALT: 76 U/L — ABNORMAL HIGH (ref 0–44)
AST: 74 U/L — ABNORMAL HIGH (ref 15–41)
Albumin: 4.3 g/dL (ref 3.5–5.0)
Alkaline Phosphatase: 62 U/L (ref 38–126)
Anion gap: 10 (ref 5–15)
BUN: 32 mg/dL — ABNORMAL HIGH (ref 8–23)
CO2: 28 mmol/L (ref 22–32)
Calcium: 9 mg/dL (ref 8.9–10.3)
Chloride: 99 mmol/L (ref 98–111)
Creatinine, Ser: 1.68 mg/dL — ABNORMAL HIGH (ref 0.44–1.00)
GFR, Estimated: 31 mL/min — ABNORMAL LOW (ref >60)
Glucose, Bld: 98 mg/dL (ref 70–99)
Potassium: 4.2 mmol/L (ref 3.5–5.1)
Sodium: 137 mmol/L (ref 135–145)
Total Bilirubin: 1 mg/dL (ref 0.3–1.2)
Total Protein: 8 g/dL (ref 6.5–8.1)

## 2022-01-19 LAB — LIPID PANEL
Cholesterol: 249 mg/dL — ABNORMAL HIGH (ref 0–200)
HDL: 67 mg/dL (ref >40)
LDL Cholesterol: 162 mg/dL — ABNORMAL HIGH (ref 0–99)
Total CHOL/HDL Ratio: 3.7 RATIO
Triglycerides: 102 mg/dL (ref <150)
VLDL: 20 mg/dL (ref 0–40)

## 2022-01-19 NOTE — Patient Instructions (Signed)
Medication Instructions:  No changes at this time.   *If you need a refill on your cardiac medications before your next appointment, please call your pharmacy*   Lab Work: CBC, CMET, Lipid, and Direct LDL to be done today over at the [facility] entrance and stop at registration desk.   If you have labs (blood work) drawn today and your tests are completely normal, you will receive your results only by: [place] (if you have MyChart) OR A paper copy in the mail If you have any lab test that is abnormal or we need to change your treatment, we will call you to review the results.   Testing/Procedures: Your physician has requested that you have a limited echocardiogram. Echocardiography is a painless test that uses sound waves to create images of your heart. It provides your doctor with information about the size and shape of your heart and how well your heart's chambers and valves are working. This procedure takes approximately one hour. There are no restrictions for this procedure. Please do NOT wear cologne, perfume, aftershave, or lotions (deodorant is allowed). Please arrive 15 minutes prior to your appointment time.  Referral for Pulmonary office for further assessment of you shortness of breath. Their number is [phone]  Schedule next available appointment with Dr. Caryl Comes to discuss RRT 5 months   Follow-Up: At [facility], you and your health needs are our priority.  As part of our continuing mission to provide you with exceptional heart care, we have created designated Provider Care Teams.  These Care Teams include your primary Cardiologist (physician) and Advanced Practice Providers (APPs -  Physician Assistants and Nurse Practitioners) who all work together to provide you with the care you need, when you need it.   Your next appointment:   6 month(s)  Provider:   Ida Rogue, MD

## 2022-01-27 ENCOUNTER — Telehealth: Payer: Self-pay

## 2022-01-27 NOTE — Telephone Encounter (Signed)
Monthly battery checks scheduled.    Pt has follow up scheduled with Dr. Caryl Comes on February 11, 2022-will discuss nearing ERI and monthly battery checks at that visit.

## 2022-01-27 NOTE — Telephone Encounter (Signed)
-----  Message from Deboraha Sprang, MD sent at 01/26/2022  5:33 PM EST ----- Remote reviewed. This remote is abnormal for Battery approaching Elective Replacement Indicator

## 2022-02-11 ENCOUNTER — Encounter: Payer: Self-pay | Admitting: Internal Medicine

## 2022-02-11 ENCOUNTER — Ambulatory Visit: Payer: Medicare Other | Attending: Internal Medicine | Admitting: Internal Medicine

## 2022-02-11 VITALS — BP 118/74 | HR 63 | Ht 60.0 in | Wt 209.8 lb

## 2022-02-11 DIAGNOSIS — I428 Other cardiomyopathies: Secondary | ICD-10-CM | POA: Diagnosis not present

## 2022-02-11 DIAGNOSIS — Z95810 Presence of automatic (implantable) cardiac defibrillator: Secondary | ICD-10-CM | POA: Diagnosis not present

## 2022-02-11 DIAGNOSIS — I472 Ventricular tachycardia, unspecified: Secondary | ICD-10-CM | POA: Diagnosis not present

## 2022-02-11 DIAGNOSIS — I4819 Other persistent atrial fibrillation: Secondary | ICD-10-CM | POA: Diagnosis not present

## 2022-02-11 DIAGNOSIS — Z79899 Other long term (current) drug therapy: Secondary | ICD-10-CM | POA: Diagnosis not present

## 2022-02-11 DIAGNOSIS — I5022 Chronic systolic (congestive) heart failure: Secondary | ICD-10-CM | POA: Diagnosis not present

## 2022-02-11 MED ORDER — APIXABAN 5 MG PO TABS: 5.0000 mg | ORAL_TABLET | Freq: Two times a day (BID) | ORAL | 1 refills | Status: DC

## 2022-02-11 MED ORDER — AMIODARONE HCL 200 MG PO TABS: 100.0000 mg | ORAL_TABLET | Freq: Every day | ORAL | 0 refills | Status: DC

## 2022-02-11 NOTE — Progress Notes (Signed)
Patient Care Team: Antony Blackbird, MD as PCP - General (Family Medicine) Deboraha Sprang, MD as PCP - Electrophysiology (Cardiology) Minna Merritts, MD as PCP - Cardiology (Cardiology) Alisa Graff, FNP as Nurse Practitioner (Cardiology) Deboraha Sprang, MD as Consulting Physician (Cardiology) Minna Merritts, MD as Consulting Physician (Cardiology)   HPI  Erin [name] is a 77 y.o. female seen in followup for Medtronic ICD implanted in the context of a nonischemic and ischemic cardiomyopathy and VT; Hx of afib- persistent. Stenting x2 on Clopidigrel and anticoagulation w apixoban.  Approaching ERI   VT previously managed with amio, but stopped 2/2 hyperthyroidism, Rx w mex alone, then resumed Amio 2/2 increasing VT burden ( discussed with Dr Clois Comber -Endo)  TSH noted 12/22 to be high, and methimazole redosed per SE   The patient denies chest pain, nocturnal dyspnea, orthopnea or peripheral edema.  There have been no palpitations, lightheadedness or syncope.  Complaints of shortness of breath which is chronic aggravated by exertion as well has fatigue.   Patient denies symptoms of respiratory, GI intolerance, sun sensitivity, neurological symptoms attributable to amiodarone.           8/23--ICD alarm.  There was appropriate ATP.  Apparently a question about high RV threshold although the report, RD-PA, "no evidence of RV lead failure "          Date Cr Hgb TSH ALT  8/18   4.9 16  11/18    2.13   12/19  11.3 26>>6.04 (2/20)   5/20 1.35    56  7/20 1.49  0.68 52  12/20 1.09 10.5 0.006 11  3/21   0.02<<0.01   5/21  12.6 (7/21) 2.07 19  12/21 1.17  4.46<< 75   6/22 1.29  6.08   8/22 1.83     9/22 1.6 4.2 6.84 (12/22)<<4.39 16  3/23   14.4 33 (6/23)  10/23   6.84   1/24 1.68 4.2  76    DATE TEST EF   5/14 Echo  20-25%   8/16 Echo  25 % (44/2.1/35)m LAE  8/18 Echo  20-25%   8/18 Cath  T Cx///90% LAD>>DES  11/18 Echo   25-30%   6/21 PYP  Ratio 1 equivocal  8/23  Echo  35-40%     PVCs  2.5 /Hr >> 15/hr (6/23)     Thromboembolic risk factors ( age -90 , HTN-1, Vasc disease -1, CHF-1, Gender-1) for a CHADSVASc Score of >=6  Past Medical History:  Diagnosis Date   Anginal pain ([place])    Automatic implantable cardioverter-defibrillator in situ    a. s/p MDT ICD 07/2012; b. SN # [id number]    CAD (coronary artery disease)    a. cath 2003: LM nl, mid LAD 50%, LCx nl, RCA nl b. L&RHC 08/17/2014 100% occluded mid LCx, 70% mid LAD with FFR 0.82. Medical therapy. CI 2.1. CO 4.01c. LHC 8/18: CTO LCx w/ R-L colatts, LAD s/p PCI/DES x 2   Chronic systolic CHF (congestive heart failure) ([place])    a. echo 2014: EF 25%, diffuse HK, LA moderately dilated, RA mildly dilated, PASP 38 mm Hg; b. echo 08/2014: EF 25-30%, diffuse HK, RWMA cannot be excluded, mild MR, mild biatrial enlargement, RV mildly dilated, wall thickness nl, pacer wire/catheter noted, mild to mod TR, PASP high nl, c. TTE 8/18: EF 25-30%, diffuse HK, GR1DD, mod MR, mod dilated LA, nl RV sys fxn, PASP 56  Encounter for long-term (current) use of anticoagulants    Exertional shortness of breath    H/O medication noncompliance    High cholesterol    Hypertension    NICM (nonischemic cardiomyopathy) ([place])    a. s/p AICD 07/22/2012   Obesity    OSA on CPAP    "suppose to; not often" (07/22/2012)   Other "heavy-for-dates" infants    PAF (paroxysmal atrial fibrillation) ([place])    a. on warfarin--> Eliquis 08/2014    Past Surgical History:  Procedure Laterality Date   ABDOMINAL HYSTERECTOMY  ~ 1998   "laparoscopic" (07/22/2012)   CARDIAC CATHETERIZATION  ? 1990's   CARDIAC CATHETERIZATION N/A 08/17/2014   Procedure: Right/Left Heart Cath and Coronary Angiography;  Surgeon: Burnell Blanks, MD;  Location: [place] [facility];  Service: Cardiovascular;  Laterality: N/A;   CARDIAC DEFIBRILLATOR PLACEMENT  07/22/2012   dual-chamber ICD.   CARDIOVERSION N/A 01/13/2019   Procedure: CARDIOVERSION;  Surgeon:  Minna Merritts, MD;  Location: [facility];  Service: Cardiovascular;  Laterality: N/A;   CORONARY STENT INTERVENTION N/A 08/24/2016   Procedure: CORONARY STENT INTERVENTION;  Surgeon: Wellington Hampshire, MD;  Location: [place];  Service: Cardiovascular;  Laterality: N/A;   IMPLANTABLE CARDIOVERTER DEFIBRILLATOR IMPLANT N/A 07/22/2012   Procedure: IMPLANTABLE CARDIOVERTER DEFIBRILLATOR IMPLANT;  Surgeon: Evans Lance, MD;  Location: [facility] CATH LAB;  Service: Cardiovascular;  Laterality: N/A;   LEFT HEART CATH AND CORONARY ANGIOGRAPHY N/A 08/24/2016   Procedure: LEFT HEART CATH AND CORONARY ANGIOGRAPHY;  Surgeon: Wellington Hampshire, MD;  Location: [place] [facility];  Service: Cardiovascular;  Laterality: N/A;   TEE WITHOUT CARDIOVERSION N/A 08/21/2014   Procedure: TRANSESOPHAGEAL ECHOCARDIOGRAM (TEE);  Surgeon: Skeet Latch, MD;  Location: [facility] ENDOSCOPY;  Service: Cardiovascular;  Laterality: N/A;   TUBAL LIGATION  ~ 1978    Current Outpatient Medications  Medication Sig Dispense Refill   amiodarone (PACERONE) 200 MG tablet Take 1 tablet by mouth once daily 90 tablet 0   apixaban (ELIQUIS) 5 MG TABS tablet Take 1 tablet by mouth twice daily 60 tablet 5   atorvastatin (LIPITOR) 40 MG tablet TAKE 1 TABLET BY MOUTH  DAILY 90 tablet 2   clopidogrel (PLAVIX) 75 MG tablet TAKE 1 TABLET BY MOUTH DAILY 90 tablet 2   empagliflozin (JARDIANCE) 10 MG TABS tablet TAKE 1 TABLET BY MOUTH ONCE DAILY BEFORE BREAKFAST 30 tablet 0   ENTRESTO 24-26 MG Take 1 tablet by mouth twice daily 180 tablet 3   ezetimibe (ZETIA) 10 MG tablet TAKE 1 TABLET BY MOUTH DAILY 90 tablet 2   propranolol ER (INDERAL LA) 160 MG SR capsule TAKE 1 CAPSULE BY MOUTH DAILY 90 capsule 2   spironolactone (ALDACTONE) 25 MG tablet TAKE ONE-HALF TABLET BY MOUTH  DAILY 45 tablet 0   torsemide (DEMADEX) 20 MG tablet TAKE 2 TABLETS BY MOUTH DAILY (Patient taking differently: Take 20 mg by mouth daily.) 180 tablet 0   No current  facility-administered medications for this visit.    No Known Allergies  Review of Systems negative except from HPI and PMH  Physical Exam    BP 118/74   Pulse 63   Ht 5' (1.524 m)   Wt 209 lb 12.8 oz (95.2 kg)   SpO2 98%   BMI 40.97 kg/m  Well developed and Morbidly obese in no acute distress HENT normal Neck supple  Clear Device pocket well healed; without hematoma or erythema.  There is no tethering  Regular rate and  rhythm, no  gallop No murmur Abd-soft with active BS No Clubbing cyanosis  edema Skin-warm and dry A & Oriented  Grossly normal sensory and motor function  ECG sinus at 61 Interval 21/07/41 Low voltage   Assessment and  Plan Atrial fibrillation-persistent  Nonischemic cardiomyopathy  Coronary artery disease-2 vessel with recent stenting  Ventricular tachycardia-recurrent treated via device  Implantable defibrillator-Medtronic  Treated hyperthyroidism/hypothryoidism  Congestive heart failure-chronic sys  Hypertransaminasemia normalized off amiodarone-- normal on resumed amio   PVCs  Low voltage ECG  neg PYP   Hypertransaminasemia   No interval treated ventricular arrhythmias.  Device is approaching ERI.  Discussed. Continue the amiodarone; however, transaminases are elevated; we will decrease the amiodarone from 200--100 mg a day and repeat the transaminase levels in about 6 weeks.  We discussed the possibility of having to stop the amiodarone.  Will check her TSH at the same time, last 1 was elevated.  She is euvolemic.  Will continue her on the Aldactone and the Demadex . With her OB dysfunction, continue her on the [facility]; she remains on propranolol, not guideline directed, because of its late acting sodium channel blocking properties which has been associated temporally with improved control of her ventricular arrhythmias.  Now on Farxiga

## 2022-02-11 NOTE — Patient Instructions (Signed)
Medication Instructions:  Your physician has recommended you make the following change in your medication:   ** Decrease Amiodarone 200mg  to 1/2 tablet (100mg ) by mouth daily  *If you need a refill on your cardiac medications before your next appointment, please call your pharmacy*   Lab Work: CMET and TSH in 6 weeks  If you have labs (blood work) drawn today and your tests are completely normal, you will receive your results only by: [place] (if you have MyChart) OR A paper copy in the mail If you have any lab test that is abnormal or we need to change your treatment, we will call you to review the results.   Testing/Procedures: None ordered.    Follow-Up: At [facility], you and your health needs are our priority.  As part of our continuing mission to provide you with exceptional heart care, we have created designated Provider Care Teams.  These Care Teams include your primary Cardiologist (physician) and Advanced Practice Providers (APPs -  Physician Assistants and Nurse Practitioners) who all work together to provide you with the care you need, when you need it.  We recommend signing up for the patient portal called "MyChart".  Sign up information is provided on this After Visit Summary.  MyChart is used to connect with patients for Virtual Visits (Telemedicine).  Patients are able to view lab/test results, encounter notes, upcoming appointments, etc.  Non-urgent messages can be sent to your provider as well.   To learn more about what you can do with MyChart, go to [email or web address].    Your next appointment:   6 months with Dr Caryl Comes

## 2022-02-12 ENCOUNTER — Ambulatory Visit (INDEPENDENT_AMBULATORY_CARE_PROVIDER_SITE_OTHER): Payer: Medicare Other

## 2022-02-12 ENCOUNTER — Ambulatory Visit (INDEPENDENT_AMBULATORY_CARE_PROVIDER_SITE_OTHER): Payer: Medicare Other | Admitting: Pulmonary Disease

## 2022-02-12 ENCOUNTER — Encounter (HOSPITAL_BASED_OUTPATIENT_CLINIC_OR_DEPARTMENT_OTHER): Payer: Self-pay | Admitting: Pulmonary Disease

## 2022-02-12 ENCOUNTER — Other Ambulatory Visit (HOSPITAL_BASED_OUTPATIENT_CLINIC_OR_DEPARTMENT_OTHER): Payer: Medicare Other

## 2022-02-12 VITALS — BP 112/50 | HR 63 | Temp 98.5°F | Ht 59.0 in | Wt 210.4 lb

## 2022-02-12 DIAGNOSIS — R0609 Other forms of dyspnea: Secondary | ICD-10-CM | POA: Diagnosis not present

## 2022-02-12 DIAGNOSIS — R0600 Dyspnea, unspecified: Secondary | ICD-10-CM | POA: Diagnosis not present

## 2022-02-12 MED ORDER — FLUTICASONE PROPIONATE 50 MCG/ACT NA SUSP: 1.0000 | Freq: Every evening | NASAL | 2 refills | Status: AC

## 2022-02-12 NOTE — Progress Notes (Signed)
[facility]  Chief Complaint  Patient presents with   Follow-up    Productive cough clear for about 65mo or so    Past Surgical History:  She  has a past surgical history that includes Cardiac defibrillator placement (07/22/2012); Abdominal hysterectomy (~ 1998); Tubal ligation (~ 1978); Cardiac catheterization (? 1990's); implantable cardioverter defibrillator implant (N/A, 07/22/2012); Cardiac catheterization (N/A, 08/17/2014); TEE without cardioversion (N/A, 08/21/2014); LEFT HEART CATH AND CORONARY ANGIOGRAPHY (N/A, 08/24/2016); CORONARY STENT INTERVENTION (N/A, 08/24/2016); and Cardioversion (N/A, 01/13/2019).  Past Medical History:  CAD, HFrEF, VT s/p AICD, HLD, HTN, Persistent A fib, Amiodarone induce thyrotoxicosis, Hypothyroidism  Constitutional:  BP (!) 112/50 (BP Location: Left Arm, Cuff Size: Normal)   Pulse 63   Temp 98.5 F (36.9 C) (Oral)   Ht 4\' 11"  (1.499 m)   Wt 210 lb 6.4 oz (95.4 kg)   SpO2 95%   BMI 42.50 kg/m   Brief Summary:  Erin [name] is a 77 y.o. female with dyspnea and complex sleep apnea.      Subjective:   She is here with her daughter.  She has noticed trouble with a cough for years.  She gets sinus congestion and post nasal drip.  This causes a tickle in her throat and she has to clear her throat. She will cough up clear sputum.  She gets wheeze from her throat area.  She used an allergy pill before and this seemed to help.  No history of smoking.  No history of asthma.  She gets winded after walking about 100 feet.  She recovers after resting for a few minutes.  She is not having chest tightness or wheezing in her chest.  She remembers using an inhaler for her breathing before, and this seemed to help.  She was told at one point she might have COPD, but doesn't remember ever being told she has asthma.  No history of pneumonia or tuberculosis.  She uses Bipap every night.  No issues with mask fit or  pressure setting.  Physical Exam:   Appearance - well kempt, uses a walker  ENMT - no sinus tenderness, no oral exudate, no LAN, Mallampati 3 airway, no stridor, wears dentures, deviated septum  Respiratory - equal breath sounds bilaterally, no wheezing or rales  CV - s1s2 regular rate and rhythm, no murmurs  Ext - no clubbing, no edema  Skin - no rashes  Psych - normal mood and affect   Pulmonary testing:  PFT 01/26/13 >> FEV1 1.04 (75%), FEV1% 73, DLCO 50%  Chest Imaging:    Sleep Tests:  PSG 2006 >> AHI 77 Auto Bipap 11/13/21 to 02/10/22 >> used on 90 of 90 nights with average 7 hrs 56 min.  Average AHI 9.4 with median Bipap 15/10 and 95% percentile Bipap 18/13 cm H2O  Cardiac Tests:  Echo 08/14/21 >> EF 35 to 40%, grade 2 DD, RVSP 48.4 mmHg, mild/mod LA dilation, mild MR, mild/mod TR  Social History:  She  reports that she has never smoked. She has never used smokeless tobacco. She reports that she does not drink alcohol and does not use drugs.  Family History:  Her family history includes Diabetes in her sister; Heart disease in her father and mother; Heart failure in her brother; Lung disease in her sister.    Labs:      Latest Ref Rng & Units 01/19/2022   12:07 PM 09/29/2021    2:56 PM 07/03/2021  4:07 PM  CMP  Glucose 70 - 99 mg/dL 98  104    BUN 8 - 23 mg/dL 32  25    Creatinine 0.44 - 1.00 mg/dL 1.68  1.64    Sodium 135 - 145 mmol/L 137  143    Potassium 3.5 - 5.1 mmol/L 4.2  3.8    Chloride 98 - 111 mmol/L 99  107    CO2 22 - 32 mmol/L 28  28    Calcium 8.9 - 10.3 mg/dL 9.0  9.0    Total Protein 6.5 - 8.1 g/dL 8.0   7.8   Total Bilirubin 0.3 - 1.2 mg/dL 1.0   0.6   Alkaline Phos 38 - 126 U/L 62   82   AST 15 - 41 U/L 74   36   ALT 0 - 44 U/L 76   33        Latest Ref Rng & Units 01/19/2022   12:07 PM 07/03/2021    4:07 PM 08/27/2020    4:10 AM  CBC  WBC 4.0 - 10.5 K/uL 5.9  6.0  7.2   Hemoglobin 12.0 - 15.0 g/dL 13.9  13.2  13.5   Hematocrit  36.0 - 46.0 % 43.4  39.7  39.9   Platelets 150 - 400 K/uL 264  315  278    Assessment/Plan:   Dyspnea on exertion. - most likely related to obesity with deconditioning and congestive heart failure - will arrange for pulmonary function test and chest xray to assess for respiratory condition that could be contributing to her symptoms - discussed the importance of maintaining a regular exercise regimen as tolerated  Upper airway cough syndrome with post nasal drip and deviated nasal septum. - will have her try nasal irrigation and flonase at night  Complex sleep apnea. - she is compliant with Bipap and reports benefit from therapy - she uses [employer] for her DME - continue auto Bipap with max IPAP 18, min EPAP 8, PS 5 cm H2O  Chronic combined CHF, Persistent Atrial fibrillation, VT s/p AICD. - followed by Dr. Ida Rogue and Dr. Virl Axe with cardiology  Time Spent Involved in Patient Care on Day of Examination:  46 minutes  Follow up:   Patient Instructions  Chest xray today  Will arrange for a pulmonary function test  Use saline nasal rinse nightly followed by flonase 1 spray in each nostril nightly  Follow up in 8 weeks  Medication List:   Allergies as of 02/12/2022   No Known Allergies      Medication List        Accurate as of February 12, 2022 11:19 AM. If you have any questions, ask your nurse or doctor.          amiodarone 200 MG tablet Commonly known as: PACERONE Take 0.5 tablets (100 mg total) by mouth daily.   apixaban 5 MG Tabs tablet Commonly known as: Eliquis Take 1 tablet (5 mg total) by mouth 2 (two) times daily.   atorvastatin 40 MG tablet Commonly known as: LIPITOR TAKE 1 TABLET BY MOUTH  DAILY   clopidogrel 75 MG tablet Commonly known as: PLAVIX TAKE 1 TABLET BY MOUTH DAILY   Entresto 24-26 MG Generic drug: sacubitril-valsartan Take 1 tablet by mouth twice daily   ezetimibe 10 MG tablet Commonly known as: ZETIA TAKE 1 TABLET  BY MOUTH DAILY   fluticasone 50 MCG/ACT nasal spray Commonly known as: FLONASE Place 1 spray into both nostrils at bedtime. Started  by: Chesley Mires, MD   Jardiance 10 MG Tabs tablet Generic drug: empagliflozin TAKE 1 TABLET BY MOUTH ONCE DAILY BEFORE BREAKFAST   propranolol ER 160 MG SR capsule Commonly known as: INDERAL LA TAKE 1 CAPSULE BY MOUTH DAILY   spironolactone 25 MG tablet Commonly known as: ALDACTONE TAKE ONE-HALF TABLET BY MOUTH  DAILY   torsemide 20 MG tablet Commonly known as: DEMADEX TAKE 2 TABLETS BY MOUTH DAILY What changed: how much to take        Signature:  Chesley Mires, MD [place] Pager - [phone] 02/12/2022, 11:19 AM

## 2022-02-12 NOTE — Patient Instructions (Signed)
Chest xray today  Will arrange for a pulmonary function test  Use saline nasal rinse nightly followed by flonase 1 spray in each nostril nightly  Follow up in 8 weeks

## 2022-02-13 ENCOUNTER — Other Ambulatory Visit: Payer: Medicare Other

## 2022-02-13 ENCOUNTER — Ambulatory Visit: Payer: Medicare Other | Attending: Physician Assistant

## 2022-02-13 DIAGNOSIS — I5020 Unspecified systolic (congestive) heart failure: Secondary | ICD-10-CM | POA: Diagnosis not present

## 2022-02-14 LAB — ECHOCARDIOGRAM LIMITED
Area-P 1/2: 3.63 cm2
Calc EF: 43.3 %
S' Lateral: 3.7 cm
Single Plane A2C EF: 42 %
Single Plane A4C EF: 43.6 %

## 2022-02-17 ENCOUNTER — Other Ambulatory Visit: Payer: Self-pay | Admitting: Cardiovascular Disease

## 2022-02-18 ENCOUNTER — Other Ambulatory Visit: Payer: Self-pay | Admitting: Cardiovascular Disease

## 2022-02-24 ENCOUNTER — Encounter: Payer: Self-pay | Admitting: Internal Medicine

## 2022-02-24 ENCOUNTER — Ambulatory Visit (INDEPENDENT_AMBULATORY_CARE_PROVIDER_SITE_OTHER): Payer: Medicare Other | Admitting: Internal Medicine

## 2022-02-24 VITALS — BP 130/82 | HR 80 | Ht 59.0 in | Wt 214.8 lb

## 2022-02-24 DIAGNOSIS — E0590 Thyrotoxicosis, unspecified without thyrotoxic crisis or storm: Secondary | ICD-10-CM | POA: Diagnosis not present

## 2022-02-24 LAB — TSH: TSH: 6.6 u[IU]/mL — ABNORMAL HIGH (ref 0.35–5.50)

## 2022-02-24 LAB — T3, FREE: T3, Free: 2.4 pg/mL (ref 2.3–4.2)

## 2022-02-24 LAB — T4, FREE: Free T4: 0.85 ng/dL (ref 0.60–1.60)

## 2022-02-24 NOTE — Patient Instructions (Signed)
Please stop at the lab.  If we need to start Levothyroxine, take the thyroid hormone every day, with water, at least 30 minutes before breakfast, separated by at least 4 hours from: - acid reflux medications - calcium - iron - multivitamins  You should have an endocrinology follow-up appointment in 6 months.

## 2022-02-24 NOTE — Progress Notes (Signed)
Patient ID: Erin [name], female   DOB: Feb 19, 1945, 77 y.o.   MRN: [id number]  HPI  Erin [name] is a 77 y.o.-year-old female, returning for follow-up for h/o amiodarone-induced thyrotoxicosis.  She previously saw Dr. Loanne [name], but last visit with me 4 months ago.  Interim history: At today's visit, patient is feeling well, without complaints except for cough after a URI.  No shortness of breath.  She is not on antibiotics or steroids. She had cataract surgeries since last OV.  No fatigue, weight gain, cold intolerance. Her Amiodarone dose was reduced to 50% 2 weeks ago.   I reviewed patient's chart and also Dr. Cordelia [name] last note: Patient was on amiodarone between 2014-2020.  This was restarted in 2022 at 200 mg daily.  She currently continues on this dose.  TFTs were fluctuating while on amiodarone, starting in 2015.  She was initially on methimazole, then came off and then restarted in 2018 and stopped in 2019.  She started levothyroxine in 2020 but stopped in 12/2018 and started back on methimazole in titration for cardioversion in 01/2019.  Methimazole was again stopped in 09/2019 due to hypothyroidism and restarted in 12/2019.  At last visit with Dr. Loanne [name], in 03/2021, she was on methimazole 2.5 mg 3x a week >> this was stopped as a TSH was found to be 14.4.  Latest TSH obtained 3 months later was improved, but still abnormal, at 7.4.  She is on Inderal LA 160 mg daily  At last visit she was not on any thyroid medication.  We did not start her on levothyroxine.  I reviewed pt's thyroid tests: Lab Results  Component Value Date   TSH 6.84 (H) 10/21/2021   TSH 7.400 (H) 07/03/2021   TSH 14.43 (H) 03/12/2021   TSH 6.87 (H) 12/11/2020   TSH 4.390 09/10/2020   TSH 6.080 (H) 06/18/2020   TSH 4.46 11/09/2019   TSH 75.28 (H) 09/26/2019   TSH 31.66 (H) 07/28/2019   TSH 2.07 05/29/2019   FREET4 0.95 10/21/2021   FREET4 0.81 03/12/2021   FREET4 0.85 12/11/2020   FREET4  1.21 09/10/2020   FREET4 0.91 11/09/2019   FREET4 0.27 (L) 09/26/2019   FREET4 0.62 07/28/2019   FREET4 0.85 05/29/2019   FREET4 1.06 04/13/2019   FREET4 1.05 03/13/2019   T3FREE 2.7 10/21/2021   T3FREE 3.2 12/11/2020   T3FREE 1.8 (L) 09/10/2020   T3FREE 3.9 01/24/2019   T3FREE 3.9 01/10/2019   T3FREE 3.1 12/23/2018   T3FREE 3.1 08/20/2016   Antithyroid antibodies: Lab Results  Component Value Date   TSI 184 (H) 10/21/2021   Component     Latest Ref Rng 10/21/2021  Thyroperoxidase Ab SerPl-aCnc     <9 IU/mL 1   Thyroglobulin Ab     < or = 1 IU/mL <1   TRAB     <=2.00 IU/L 2.69 (H)    Pt denies: - feeling nodules in neck - hoarseness - dysphagia - choking  She denies: - fatigue - excessive sweating/heat intolerance - tremors - anxiety - palpitations - hyperdefecation  Pt does not have a FH of thyroid ds. No FH of thyroid cancer. No h/o radiation tx to head or neck. No recent contrast studies. No steroid use. No herbal supplements. No Biotin use.  Pt. also has a history of CAD, NICM, sCHF, atrial fibrillation, HTN, history of medication noncompliance.  ROS: + see HPI  Past Medical History:  Diagnosis Date   Anginal pain ([place])    Automatic  implantable cardioverter-defibrillator in situ    a. s/p MDT ICD 07/2012; b. SN # [id number]    CAD (coronary artery disease)    a. cath 2003: LM nl, mid LAD 50%, LCx nl, RCA nl b. L&RHC 08/17/2014 100% occluded mid LCx, 70% mid LAD with FFR 0.82. Medical therapy. CI 2.1. CO 4.01c. LHC 8/18: CTO LCx w/ R-L colatts, LAD s/p PCI/DES x 2   Chronic systolic CHF (congestive heart failure) ([place])    a. echo 2014: EF 25%, diffuse HK, LA moderately dilated, RA mildly dilated, PASP 38 mm Hg; b. echo 08/2014: EF 25-30%, diffuse HK, RWMA cannot be excluded, mild MR, mild biatrial enlargement, RV mildly dilated, wall thickness nl, pacer wire/catheter noted, mild to mod TR, PASP high nl, c. TTE 8/18: EF 25-30%, diffuse HK, GR1DD, mod MR, mod  dilated LA, nl RV sys fxn, PASP 56   Encounter for long-term (current) use of anticoagulants    Exertional shortness of breath    H/O medication noncompliance    High cholesterol    Hypertension    NICM (nonischemic cardiomyopathy) ([place])    a. s/p AICD 07/22/2012   Obesity    OSA on CPAP    "suppose to; not often" (07/22/2012)   Other "heavy-for-dates" infants    PAF (paroxysmal atrial fibrillation) ([place])    a. on warfarin--> Eliquis 08/2014   Past Surgical History:  Procedure Laterality Date   ABDOMINAL HYSTERECTOMY  ~ 1998   "laparoscopic" (07/22/2012)   CARDIAC CATHETERIZATION  ? 1990's   CARDIAC CATHETERIZATION N/A 08/17/2014   Procedure: Right/Left Heart Cath and Coronary Angiography;  Surgeon: Burnell Blanks, MD;  Location: [place] [facility];  Service: Cardiovascular;  Laterality: N/A;   CARDIAC DEFIBRILLATOR PLACEMENT  07/22/2012   dual-chamber ICD.   CARDIOVERSION N/A 01/13/2019   Procedure: CARDIOVERSION;  Surgeon: Minna Merritts, MD;  Location: [facility] ORS;  Service: Cardiovascular;  Laterality: N/A;   CORONARY STENT INTERVENTION N/A 08/24/2016   Procedure: CORONARY STENT INTERVENTION;  Surgeon: Wellington Hampshire, MD;  Location: [place] [facility];  Service: Cardiovascular;  Laterality: N/A;   IMPLANTABLE CARDIOVERTER DEFIBRILLATOR IMPLANT N/A 07/22/2012   Procedure: IMPLANTABLE CARDIOVERTER DEFIBRILLATOR IMPLANT;  Surgeon: Evans Lance, MD;  Location: [facility] CATH LAB;  Service: Cardiovascular;  Laterality: N/A;   LEFT HEART CATH AND CORONARY ANGIOGRAPHY N/A 08/24/2016   Procedure: LEFT HEART CATH AND CORONARY ANGIOGRAPHY;  Surgeon: Wellington Hampshire, MD;  Location: [place] [facility] LAB;  Service: Cardiovascular;  Laterality: N/A;   TEE WITHOUT CARDIOVERSION N/A 08/21/2014   Procedure: TRANSESOPHAGEAL ECHOCARDIOGRAM (TEE);  Surgeon: Skeet Latch, MD;  Location[facility] ENDOSCOPY;  Service: Cardiovascular;  Laterality: N/A;   TUBAL LIGATION  ~ 1978   Social History    Socioeconomic History   Marital status: Single    Spouse name: Not on file   Number of children: 3   Years of education: Not on file   Highest education level: Not on file  Occupational History   Occupation: Retired    Fish farm manager: [employer]  Tobacco Use   Smoking status: Never   Smokeless tobacco: Never  Vaping Use   Vaping Use: Never used  Substance and Sexual Activity   Alcohol use: No   Drug use: No   Sexual activity: Never  Other Topics Concern   Not on file  Social History Narrative   Not on file   Social Determinants of Health   Financial Resource Strain: Not on file  Food Insecurity: Not  on file  Transportation Needs: Not on file  Physical Activity: Not on file  Stress: Not on file  Social Connections: Not on file  Intimate Partner Violence: Not on file   Current Outpatient Medications on File Prior to Visit  Medication Sig Dispense Refill   amiodarone (PACERONE) 200 MG tablet Take 0.5 tablets (100 mg total) by mouth daily. 90 tablet 0   apixaban (ELIQUIS) 5 MG TABS tablet Take 1 tablet (5 mg total) by mouth 2 (two) times daily. 180 tablet 1   atorvastatin (LIPITOR) 40 MG tablet TAKE 1 TABLET BY MOUTH  DAILY 90 tablet 2   clopidogrel (PLAVIX) 75 MG tablet TAKE 1 TABLET BY MOUTH DAILY 90 tablet 2   empagliflozin (JARDIANCE) 10 MG TABS tablet TAKE 1 TABLET BY MOUTH ONCE DAILY BEFORE BREAKFAST 30 tablet 3   ENTRESTO 24-26 MG Take 1 tablet by mouth twice daily 180 tablet 3   ezetimibe (ZETIA) 10 MG tablet TAKE 1 TABLET BY MOUTH DAILY 90 tablet 2   fluticasone (FLONASE) 50 MCG/ACT nasal spray Place 1 spray into both nostrils at bedtime. 16 g 2   propranolol ER (INDERAL LA) 160 MG SR capsule TAKE 1 CAPSULE BY MOUTH DAILY 90 capsule 2   spironolactone (ALDACTONE) 25 MG tablet TAKE ONE-HALF TABLET BY MOUTH  DAILY 45 tablet 1   torsemide (DEMADEX) 20 MG tablet TAKE 2 TABLETS BY MOUTH DAILY (Patient taking differently: Take 20 mg by mouth daily.) 180 tablet 0   No  current facility-administered medications on file prior to visit.   No Known Allergies Family History  Problem Relation Age of Onset   Heart disease Mother    Heart disease Father    Lung disease Sister    Diabetes Sister    Heart failure Brother    Thyroid disease Neg Hx    PE: BP 130/82 (BP Location: Left Arm, Patient Position: Sitting, Cuff Size: Normal)   Pulse 80   Ht 4' 11"$  (1.499 m)   Wt 214 lb 12.8 oz (97.4 kg)   SpO2 98%   BMI 43.38 kg/m  Wt Readings from Last 3 Encounters:  02/24/22 214 lb 12.8 oz (97.4 kg)  02/12/22 210 lb 6.4 oz (95.4 kg)  02/11/22 209 lb 12.8 oz (95.2 kg)   Constitutional: overweight, in NAD, uses a walker Eyes:no exophthalmos, no lid lag, no stare  ENT: no thyromegaly, no cervical lymphadenopathy Cardiovascular: RRR, No MRG Respiratory: + B lower pulmonary wheezing Musculoskeletal: no deformities Skin: no rashes Neurological: no tremor with outstretched hands  ASSESSMENT: 1.  History of amiodarone induced thyrotoxicosis - Fluctuating TFTs over the years  2.  Mild Graves' disease  PLAN:  1. Patient with history of widely fluctuating TFTs, previously on both methimazole and levothyroxine.  It was unclear why her TFTs fluctuated to this extent, since it was not completely expected from her diagnosis of amiodarone induced thyrotoxicosis.  The hypothesis at last visit was that she possibly had elevated TRAb antibodies with alternating stimulating and suppressing antibodies.  Indeed, her TSI's and TRAb antibodies were positive. -She was previously on methimazole low-dose, and this was stopped when the TSH increased to 14.  Her TSH improved afterwards, but at last visit it was still slightly elevated: Lab Results  Component Value Date   TSH 6.84 (H) 10/21/2021  -However, due to her history of atrial fibrillation, and the lack of symptoms, and also due to age, we decided to just follow her clinically, without intervention -At today's visit, she  denies weight loss, heat intolerance, hyperdefecation, palpitations, but also significant weight gain (gained 5 lbs), increased fatigue, cold intolerance -At today's visit, we will check her TFTs -If they increase, will start a low-dose levothyroxine.  I explained how to take this.  If they improved, we can follow her without treatment. - will check the TSH, fT3 and fT4 and also add TPO, ATA antibodies along with TRAb and thyroid stimulating antibodies to screen for Hashimoto's thyroiditis and Graves' disease.  -She continues on beta-blockers, per cardiology - I we will have her return in 6 months, but possibly sooner for labs  2.  Mild Graves' disease -TSI antibodies were elevated at last check -No signs of Graves' ophthalmopathy: No double vision, blurry vision, eye pain, chemosis.  In fact, her vision improved significantly after her recent cataract surgery. -Will continue to keep an eye on this but no intervention is needed for now  Component     Latest Ref Rng 02/24/2022  TSH     0.35 - 5.50 uIU/mL 6.60 (H)   T4,Free(Direct)     0.60 - 1.60 ng/dL 0.85   Triiodothyronine,Free,Serum     2.3 - 4.2 pg/mL 2.4   TSH is slightly lower than before, while free T4 and free T3 are normal.  For now, we can continue without levothyroxine.  Philemon Kingdom, MD PhD [facility] Endocrinology

## 2022-03-17 ENCOUNTER — Ambulatory Visit (INDEPENDENT_AMBULATORY_CARE_PROVIDER_SITE_OTHER): Payer: Medicare Other

## 2022-03-17 DIAGNOSIS — I428 Other cardiomyopathies: Secondary | ICD-10-CM

## 2022-03-19 LAB — CUP PACEART REMOTE DEVICE CHECK
Battery Remaining Longevity: 3 mo
Battery Voltage: 2.81 V
Brady Statistic AP VP Percent: 0 %
Brady Statistic AP VS Percent: 0.39 %
Brady Statistic AS VP Percent: 0.04 %
Brady Statistic AS VS Percent: 99.57 %
Brady Statistic RA Percent Paced: 0.39 %
Brady Statistic RV Percent Paced: 0.04 %
Date Time Interrogation Session: 20240312022604
HighPow Impedance: 76 Ohm
Implantable Lead Connection Status: 753985
Implantable Lead Connection Status: 753985
Implantable Lead Implant Date: 20140718
Implantable Lead Implant Date: 20140718
Implantable Lead Location: 753859
Implantable Lead Location: 753860
Implantable Lead Model: 5076
Implantable Lead Model: 6935
Implantable Pulse Generator Implant Date: 20140718
Lead Channel Impedance Value: 342 Ohm
Lead Channel Impedance Value: 399 Ohm
Lead Channel Impedance Value: 399 Ohm
Lead Channel Pacing Threshold Amplitude: 0.625 V
Lead Channel Pacing Threshold Amplitude: 0.625 V
Lead Channel Pacing Threshold Pulse Width: 0.4 ms
Lead Channel Pacing Threshold Pulse Width: 0.4 ms
Lead Channel Sensing Intrinsic Amplitude: 11.375 mV
Lead Channel Sensing Intrinsic Amplitude: 11.375 mV
Lead Channel Sensing Intrinsic Amplitude: 3.375 mV
Lead Channel Sensing Intrinsic Amplitude: 3.375 mV
Lead Channel Setting Pacing Amplitude: 2 V
Lead Channel Setting Pacing Amplitude: 2.5 V
Lead Channel Setting Pacing Pulse Width: 0.4 ms
Lead Channel Setting Sensing Sensitivity: 0.3 mV
Zone Setting Status: 755011

## 2022-03-25 ENCOUNTER — Ambulatory Visit: Payer: Medicare Other | Attending: Pulmonary Disease

## 2022-03-25 ENCOUNTER — Other Ambulatory Visit: Payer: Medicare Other

## 2022-03-25 DIAGNOSIS — I428 Other cardiomyopathies: Secondary | ICD-10-CM | POA: Diagnosis not present

## 2022-03-25 DIAGNOSIS — Z95810 Presence of automatic (implantable) cardiac defibrillator: Secondary | ICD-10-CM

## 2022-03-25 DIAGNOSIS — I472 Ventricular tachycardia, unspecified: Secondary | ICD-10-CM | POA: Diagnosis not present

## 2022-03-25 DIAGNOSIS — I5022 Chronic systolic (congestive) heart failure: Secondary | ICD-10-CM | POA: Diagnosis not present

## 2022-03-25 DIAGNOSIS — I4819 Other persistent atrial fibrillation: Secondary | ICD-10-CM

## 2022-03-25 DIAGNOSIS — Z79899 Other long term (current) drug therapy: Secondary | ICD-10-CM

## 2022-03-26 ENCOUNTER — Other Ambulatory Visit: Payer: Self-pay | Admitting: Internal Medicine

## 2022-03-26 DIAGNOSIS — E0590 Thyrotoxicosis, unspecified without thyrotoxic crisis or storm: Secondary | ICD-10-CM

## 2022-03-26 DIAGNOSIS — E038 Other specified hypothyroidism: Secondary | ICD-10-CM

## 2022-03-26 LAB — COMPREHENSIVE METABOLIC PANEL
ALT: 73 IU/L — ABNORMAL HIGH (ref 0–32)
AST: 80 IU/L — ABNORMAL HIGH (ref 0–40)
Albumin/Globulin Ratio: 1.5 (ref 1.2–2.2)
Albumin: 4.1 g/dL (ref 3.8–4.8)
Alkaline Phosphatase: 92 IU/L (ref 44–121)
BUN/Creatinine Ratio: 12 (ref 12–28)
BUN: 18 mg/dL (ref 8–27)
Bilirubin Total: 0.7 mg/dL (ref 0.0–1.2)
CO2: 23 mmol/L (ref 20–29)
Calcium: 9.2 mg/dL (ref 8.7–10.3)
Chloride: 101 mmol/L (ref 96–106)
Creatinine, Ser: 1.46 mg/dL — ABNORMAL HIGH (ref 0.57–1.00)
Globulin, Total: 2.7 g/dL (ref 1.5–4.5)
Glucose: 84 mg/dL (ref 70–99)
Potassium: 4.3 mmol/L (ref 3.5–5.2)
Sodium: 141 mmol/L (ref 134–144)
Total Protein: 6.8 g/dL (ref 6.0–8.5)
eGFR: 37 mL/min/{1.73_m2} — ABNORMAL LOW (ref >59)

## 2022-03-26 LAB — TSH: TSH: 9.19 u[IU]/mL — ABNORMAL HIGH (ref 0.450–4.500)

## 2022-03-26 MED ORDER — LEVOTHYROXINE SODIUM 50 MCG PO TABS: 50.0000 ug | ORAL_TABLET | Freq: Every day | ORAL | 3 refills | Status: DC

## 2022-04-17 ENCOUNTER — Ambulatory Visit (INDEPENDENT_AMBULATORY_CARE_PROVIDER_SITE_OTHER): Payer: Medicare Other

## 2022-04-17 ENCOUNTER — Encounter (HOSPITAL_BASED_OUTPATIENT_CLINIC_OR_DEPARTMENT_OTHER): Payer: Medicare Other

## 2022-04-17 ENCOUNTER — Ambulatory Visit (HOSPITAL_BASED_OUTPATIENT_CLINIC_OR_DEPARTMENT_OTHER): Payer: Medicare Other | Admitting: Pulmonary Disease

## 2022-04-17 DIAGNOSIS — I428 Other cardiomyopathies: Secondary | ICD-10-CM

## 2022-04-19 LAB — CUP PACEART REMOTE DEVICE CHECK
Battery Remaining Longevity: 2 mo
Battery Voltage: 2.81 V
Brady Statistic AP VP Percent: 0 %
Brady Statistic AP VS Percent: 0.19 %
Brady Statistic AS VP Percent: 0.04 %
Brady Statistic AS VS Percent: 99.76 %
Brady Statistic RA Percent Paced: 0.2 %
Brady Statistic RV Percent Paced: 0.04 %
Date Time Interrogation Session: 20240413063425
HighPow Impedance: 75 Ohm
Implantable Lead Connection Status: 753985
Implantable Lead Connection Status: 753985
Implantable Lead Implant Date: 20140718
Implantable Lead Implant Date: 20140718
Implantable Lead Location: 753859
Implantable Lead Location: 753860
Implantable Lead Model: 5076
Implantable Lead Model: 6935
Implantable Pulse Generator Implant Date: 20140718
Lead Channel Impedance Value: 323 Ohm
Lead Channel Impedance Value: 342 Ohm
Lead Channel Impedance Value: 399 Ohm
Lead Channel Pacing Threshold Amplitude: 0.625 V
Lead Channel Pacing Threshold Amplitude: 0.625 V
Lead Channel Pacing Threshold Pulse Width: 0.4 ms
Lead Channel Pacing Threshold Pulse Width: 0.4 ms
Lead Channel Sensing Intrinsic Amplitude: 13 mV
Lead Channel Sensing Intrinsic Amplitude: 13 mV
Lead Channel Sensing Intrinsic Amplitude: 3.5 mV
Lead Channel Sensing Intrinsic Amplitude: 3.5 mV
Lead Channel Setting Pacing Amplitude: 2 V
Lead Channel Setting Pacing Amplitude: 2.5 V
Lead Channel Setting Pacing Pulse Width: 0.4 ms
Lead Channel Setting Sensing Sensitivity: 0.3 mV
Zone Setting Status: 755011

## 2022-04-28 ENCOUNTER — Ambulatory Visit (HOSPITAL_BASED_OUTPATIENT_CLINIC_OR_DEPARTMENT_OTHER): Payer: Medicare Other | Admitting: Pulmonary Disease

## 2022-04-28 ENCOUNTER — Encounter (HOSPITAL_BASED_OUTPATIENT_CLINIC_OR_DEPARTMENT_OTHER): Payer: Medicare Other

## 2022-04-28 ENCOUNTER — Encounter (HOSPITAL_BASED_OUTPATIENT_CLINIC_OR_DEPARTMENT_OTHER): Payer: Self-pay

## 2022-04-28 NOTE — Progress Notes (Signed)
Remote ICD transmission.   

## 2022-05-05 ENCOUNTER — Other Ambulatory Visit: Payer: Self-pay | Admitting: Internal Medicine

## 2022-05-11 ENCOUNTER — Other Ambulatory Visit (INDEPENDENT_AMBULATORY_CARE_PROVIDER_SITE_OTHER): Payer: Medicare Other

## 2022-05-11 DIAGNOSIS — E038 Other specified hypothyroidism: Secondary | ICD-10-CM

## 2022-05-11 LAB — TSH: TSH: 3.91 u[IU]/mL (ref 0.35–5.50)

## 2022-05-11 LAB — T4, FREE: Free T4: 1.05 ng/dL (ref 0.60–1.60)

## 2022-05-19 ENCOUNTER — Ambulatory Visit (INDEPENDENT_AMBULATORY_CARE_PROVIDER_SITE_OTHER): Payer: Medicare Other

## 2022-05-19 DIAGNOSIS — I428 Other cardiomyopathies: Secondary | ICD-10-CM

## 2022-05-19 LAB — CUP PACEART REMOTE DEVICE CHECK
Battery Remaining Longevity: 2 mo
Battery Voltage: 2.81 V
Brady Statistic AP VP Percent: 0 %
Brady Statistic AP VS Percent: 0.27 %
Brady Statistic AS VP Percent: 0.04 %
Brady Statistic AS VS Percent: 99.69 %
Brady Statistic RA Percent Paced: 0.27 %
Brady Statistic RV Percent Paced: 0.04 %
Date Time Interrogation Session: 20240514012204
HighPow Impedance: 71 Ohm
Implantable Lead Connection Status: 753985
Implantable Lead Connection Status: 753985
Implantable Lead Implant Date: 20140718
Implantable Lead Implant Date: 20140718
Implantable Lead Location: 753859
Implantable Lead Location: 753860
Implantable Lead Model: 5076
Implantable Lead Model: 6935
Implantable Pulse Generator Implant Date: 20140718
Lead Channel Impedance Value: 342 Ohm
Lead Channel Impedance Value: 399 Ohm
Lead Channel Impedance Value: 399 Ohm
Lead Channel Pacing Threshold Amplitude: 0.625 V
Lead Channel Pacing Threshold Amplitude: 0.625 V
Lead Channel Pacing Threshold Pulse Width: 0.4 ms
Lead Channel Pacing Threshold Pulse Width: 0.4 ms
Lead Channel Sensing Intrinsic Amplitude: 13.375 mV
Lead Channel Sensing Intrinsic Amplitude: 13.375 mV
Lead Channel Sensing Intrinsic Amplitude: 3.25 mV
Lead Channel Sensing Intrinsic Amplitude: 3.25 mV
Lead Channel Setting Pacing Amplitude: 2 V
Lead Channel Setting Pacing Amplitude: 2.5 V
Lead Channel Setting Pacing Pulse Width: 0.4 ms
Lead Channel Setting Sensing Sensitivity: 0.3 mV
Zone Setting Status: 755011

## 2022-05-21 ENCOUNTER — Encounter (HOSPITAL_BASED_OUTPATIENT_CLINIC_OR_DEPARTMENT_OTHER): Payer: Self-pay | Admitting: Emergency Medicine

## 2022-05-21 ENCOUNTER — Other Ambulatory Visit: Payer: Self-pay

## 2022-05-21 ENCOUNTER — Inpatient Hospital Stay (HOSPITAL_BASED_OUTPATIENT_CLINIC_OR_DEPARTMENT_OTHER)
Admission: EM | Admit: 2022-05-21 | Discharge: 2022-05-28 | DRG: 189 | Disposition: A | Discharge status: Home Health | Payer: Medicare Other | Attending: Family Medicine | Admitting: Family Medicine

## 2022-05-21 ENCOUNTER — Emergency Department (HOSPITAL_BASED_OUTPATIENT_CLINIC_OR_DEPARTMENT_OTHER): Payer: Medicare Other | Admitting: Radiology

## 2022-05-21 DIAGNOSIS — I472 Ventricular tachycardia, unspecified: Secondary | ICD-10-CM | POA: Diagnosis present

## 2022-05-21 DIAGNOSIS — G4733 Obstructive sleep apnea (adult) (pediatric): Secondary | ICD-10-CM | POA: Diagnosis present

## 2022-05-21 DIAGNOSIS — Z955 Presence of coronary angioplasty implant and graft: Secondary | ICD-10-CM | POA: Diagnosis not present

## 2022-05-21 DIAGNOSIS — Z95810 Presence of automatic (implantable) cardiac defibrillator: Secondary | ICD-10-CM | POA: Diagnosis present

## 2022-05-21 DIAGNOSIS — Z79890 Hormone replacement therapy (postmenopausal): Secondary | ICD-10-CM | POA: Diagnosis not present

## 2022-05-21 DIAGNOSIS — I10 Essential (primary) hypertension: Secondary | ICD-10-CM | POA: Diagnosis present

## 2022-05-21 DIAGNOSIS — Z1152 Encounter for screening for COVID-19: Secondary | ICD-10-CM | POA: Diagnosis not present

## 2022-05-21 DIAGNOSIS — J9601 Acute respiratory failure with hypoxia: Principal | ICD-10-CM | POA: Diagnosis present

## 2022-05-21 DIAGNOSIS — N1832 Chronic kidney disease, stage 3b: Secondary | ICD-10-CM | POA: Diagnosis present

## 2022-05-21 DIAGNOSIS — Z8249 Family history of ischemic heart disease and other diseases of the circulatory system: Secondary | ICD-10-CM | POA: Diagnosis not present

## 2022-05-21 DIAGNOSIS — G4731 Primary central sleep apnea: Secondary | ICD-10-CM

## 2022-05-21 DIAGNOSIS — Z79899 Other long term (current) drug therapy: Secondary | ICD-10-CM

## 2022-05-21 DIAGNOSIS — Z7984 Long term (current) use of oral hypoglycemic drugs: Secondary | ICD-10-CM | POA: Diagnosis not present

## 2022-05-21 DIAGNOSIS — G4739 Other sleep apnea: Secondary | ICD-10-CM

## 2022-05-21 DIAGNOSIS — I272 Pulmonary hypertension, unspecified: Secondary | ICD-10-CM

## 2022-05-21 DIAGNOSIS — I130 Hypertensive heart and chronic kidney disease with heart failure and stage 1 through stage 4 chronic kidney disease, or unspecified chronic kidney disease: Secondary | ICD-10-CM | POA: Diagnosis present

## 2022-05-21 DIAGNOSIS — R058 Other specified cough: Principal | ICD-10-CM

## 2022-05-21 DIAGNOSIS — I255 Ischemic cardiomyopathy: Secondary | ICD-10-CM | POA: Diagnosis present

## 2022-05-21 DIAGNOSIS — E7800 Pure hypercholesterolemia, unspecified: Secondary | ICD-10-CM | POA: Diagnosis present

## 2022-05-21 DIAGNOSIS — I428 Other cardiomyopathies: Secondary | ICD-10-CM | POA: Diagnosis present

## 2022-05-21 DIAGNOSIS — R062 Wheezing: Secondary | ICD-10-CM

## 2022-05-21 DIAGNOSIS — Z833 Family history of diabetes mellitus: Secondary | ICD-10-CM | POA: Diagnosis not present

## 2022-05-21 DIAGNOSIS — E0590 Thyrotoxicosis, unspecified without thyrotoxic crisis or storm: Secondary | ICD-10-CM | POA: Diagnosis present

## 2022-05-21 DIAGNOSIS — E66813 Obesity, class 3: Secondary | ICD-10-CM

## 2022-05-21 DIAGNOSIS — N183 Chronic kidney disease, stage 3 unspecified: Secondary | ICD-10-CM | POA: Diagnosis present

## 2022-05-21 DIAGNOSIS — Z7902 Long term (current) use of antithrombotics/antiplatelets: Secondary | ICD-10-CM | POA: Diagnosis not present

## 2022-05-21 DIAGNOSIS — Z7901 Long term (current) use of anticoagulants: Secondary | ICD-10-CM | POA: Diagnosis not present

## 2022-05-21 DIAGNOSIS — I5043 Acute on chronic combined systolic (congestive) and diastolic (congestive) heart failure: Secondary | ICD-10-CM

## 2022-05-21 DIAGNOSIS — I2510 Atherosclerotic heart disease of native coronary artery without angina pectoris: Secondary | ICD-10-CM | POA: Diagnosis present

## 2022-05-21 DIAGNOSIS — E039 Hypothyroidism, unspecified: Secondary | ICD-10-CM

## 2022-05-21 DIAGNOSIS — I4819 Other persistent atrial fibrillation: Secondary | ICD-10-CM | POA: Diagnosis present

## 2022-05-21 DIAGNOSIS — R0602 Shortness of breath: Secondary | ICD-10-CM

## 2022-05-21 DIAGNOSIS — Z6841 Body Mass Index (BMI) 40.0 and over, adult: Secondary | ICD-10-CM | POA: Diagnosis not present

## 2022-05-21 DIAGNOSIS — E032 Hypothyroidism due to medicaments and other exogenous substances: Secondary | ICD-10-CM

## 2022-05-21 LAB — BASIC METABOLIC PANEL
Anion gap: 10 (ref 5–15)
BUN: 26 mg/dL — ABNORMAL HIGH (ref 8–23)
CO2: 26 mmol/L (ref 22–32)
Calcium: 9.1 mg/dL (ref 8.9–10.3)
Chloride: 103 mmol/L (ref 98–111)
Creatinine, Ser: 1.34 mg/dL — ABNORMAL HIGH (ref 0.44–1.00)
GFR, Estimated: 41 mL/min — ABNORMAL LOW (ref >60)
Glucose, Bld: 110 mg/dL — ABNORMAL HIGH (ref 70–99)
Potassium: 3.8 mmol/L (ref 3.5–5.1)
Sodium: 139 mmol/L (ref 135–145)

## 2022-05-21 LAB — RESP PANEL BY RT-PCR (RSV, FLU A&B, COVID)  RVPGX2
Influenza A by PCR: NEGATIVE
Influenza B by PCR: NEGATIVE
Resp Syncytial Virus by PCR: NEGATIVE
SARS Coronavirus 2 by RT PCR: NEGATIVE

## 2022-05-21 LAB — BRAIN NATRIURETIC PEPTIDE: B Natriuretic Peptide: 451.2 pg/mL — ABNORMAL HIGH (ref 0.0–100.0)

## 2022-05-21 LAB — CBC
HCT: 43.2 % (ref 36.0–46.0)
Hemoglobin: 13.8 g/dL (ref 12.0–15.0)
MCH: 26.8 pg (ref 26.0–34.0)
MCHC: 31.9 g/dL (ref 30.0–36.0)
MCV: 83.9 fL (ref 80.0–100.0)
Platelets: 249 10*3/uL (ref 150–400)
RBC: 5.15 MIL/uL — ABNORMAL HIGH (ref 3.87–5.11)
RDW: 15 % (ref 11.5–15.5)
WBC: 7.8 10*3/uL (ref 4.0–10.5)
nRBC: 0 % (ref 0.0–0.2)

## 2022-05-21 LAB — TROPONIN I (HIGH SENSITIVITY)
Troponin I (High Sensitivity): 6 ng/L (ref <18)
Troponin I (High Sensitivity): 6 ng/L (ref <18)

## 2022-05-21 MED ORDER — IPRATROPIUM-ALBUTEROL 0.5-2.5 (3) MG/3ML IN SOLN
RESPIRATORY_TRACT | Status: AC
  Administered 2022-05-21: 3 mL
  Filled 2022-05-21: qty 3

## 2022-05-21 MED ORDER — IPRATROPIUM BROMIDE 0.02 % IN SOLN
0.5000 mg | Freq: Once | RESPIRATORY_TRACT | Status: AC
  Administered 2022-05-21: 0.5 mg via RESPIRATORY_TRACT
  Filled 2022-05-21: qty 2.5

## 2022-05-21 MED ORDER — ALBUTEROL SULFATE (2.5 MG/3ML) 0.083% IN NEBU: 2.5000 mg | INHALATION_SOLUTION | Freq: Once | RESPIRATORY_TRACT | Status: AC

## 2022-05-21 MED ORDER — PROPRANOLOL HCL ER 160 MG PO CP24
160.0000 mg | ORAL_CAPSULE | Freq: Every day | ORAL | Status: DC
  Administered 2022-05-22: 160 mg via ORAL
  Filled 2022-05-21 (×3): qty 1

## 2022-05-21 MED ORDER — FUROSEMIDE 10 MG/ML IJ SOLN
40.0000 mg | Freq: Once | INTRAMUSCULAR | Status: AC
  Administered 2022-05-21: 40 mg via INTRAVENOUS
  Filled 2022-05-21: qty 4

## 2022-05-21 MED ORDER — IPRATROPIUM-ALBUTEROL 0.5-2.5 (3) MG/3ML IN SOLN: 3.0000 mL | Freq: Once | RESPIRATORY_TRACT | Status: AC

## 2022-05-21 MED ORDER — SPIRONOLACTONE 12.5 MG HALF TABLET
12.5000 mg | ORAL_TABLET | Freq: Every day | ORAL | Status: DC
  Administered 2022-05-22 – 2022-05-28 (×7): 12.5 mg via ORAL
  Filled 2022-05-21 (×7): qty 1

## 2022-05-21 MED ORDER — CLOPIDOGREL BISULFATE 75 MG PO TABS
75.0000 mg | ORAL_TABLET | Freq: Every day | ORAL | Status: DC
  Administered 2022-05-22 – 2022-05-28 (×7): 75 mg via ORAL
  Filled 2022-05-21 (×7): qty 1

## 2022-05-21 MED ORDER — FUROSEMIDE 10 MG/ML IJ SOLN
40.0000 mg | Freq: Every day | INTRAMUSCULAR | Status: DC
  Administered 2022-05-22 – 2022-05-23 (×2): 40 mg via INTRAVENOUS
  Filled 2022-05-21 (×3): qty 4

## 2022-05-21 MED ORDER — CHLORHEXIDINE GLUCONATE CLOTH 2 % EX PADS
6.0000 | MEDICATED_PAD | Freq: Every day | CUTANEOUS | Status: DC
  Administered 2022-05-21 – 2022-05-23 (×3): 6 via TOPICAL

## 2022-05-21 MED ORDER — ALBUTEROL SULFATE (2.5 MG/3ML) 0.083% IN NEBU
5.0000 mg | INHALATION_SOLUTION | Freq: Once | RESPIRATORY_TRACT | Status: AC
  Administered 2022-05-21: 5 mg via RESPIRATORY_TRACT
  Filled 2022-05-21: qty 6

## 2022-05-21 MED ORDER — FLUTICASONE PROPIONATE 50 MCG/ACT NA SUSP
1.0000 | Freq: Every day | NASAL | Status: DC
  Administered 2022-05-22 – 2022-05-28 (×6): 1 via NASAL
  Filled 2022-05-21: qty 16

## 2022-05-21 MED ORDER — SACUBITRIL-VALSARTAN 24-26 MG PO TABS
1.0000 | ORAL_TABLET | Freq: Two times a day (BID) | ORAL | Status: DC
  Administered 2022-05-21 – 2022-05-24 (×4): 1 via ORAL
  Filled 2022-05-21 (×7): qty 1

## 2022-05-21 MED ORDER — ALBUTEROL SULFATE (2.5 MG/3ML) 0.083% IN NEBU
INHALATION_SOLUTION | RESPIRATORY_TRACT | Status: AC
  Administered 2022-05-21: 2.5 mg
  Filled 2022-05-21: qty 3

## 2022-05-21 MED ORDER — METHYLPREDNISOLONE SODIUM SUCC 125 MG IJ SOLR
125.0000 mg | Freq: Once | INTRAMUSCULAR | Status: AC
  Administered 2022-05-21: 125 mg via INTRAVENOUS
  Filled 2022-05-21: qty 2

## 2022-05-21 MED ORDER — ATORVASTATIN CALCIUM 40 MG PO TABS
40.0000 mg | ORAL_TABLET | Freq: Every day | ORAL | Status: DC
  Administered 2022-05-22 – 2022-05-28 (×7): 40 mg via ORAL
  Filled 2022-05-21 (×7): qty 1

## 2022-05-21 MED ORDER — EZETIMIBE 10 MG PO TABS
10.0000 mg | ORAL_TABLET | Freq: Every day | ORAL | Status: DC
  Administered 2022-05-22 – 2022-05-28 (×7): 10 mg via ORAL
  Filled 2022-05-21 (×8): qty 1

## 2022-05-21 MED ORDER — LEVOTHYROXINE SODIUM 50 MCG PO TABS
50.0000 ug | ORAL_TABLET | Freq: Every day | ORAL | Status: DC
  Administered 2022-05-22 – 2022-05-28 (×7): 50 ug via ORAL
  Filled 2022-05-21 (×7): qty 1

## 2022-05-21 MED ORDER — AMIODARONE HCL 200 MG PO TABS
100.0000 mg | ORAL_TABLET | Freq: Every day | ORAL | Status: DC
  Administered 2022-05-22 – 2022-05-28 (×7): 100 mg via ORAL
  Filled 2022-05-21 (×7): qty 1

## 2022-05-21 MED ORDER — ORAL CARE MOUTH RINSE: 15.0000 mL | OROMUCOSAL | Status: DC | PRN

## 2022-05-21 MED ORDER — APIXABAN 5 MG PO TABS
5.0000 mg | ORAL_TABLET | Freq: Two times a day (BID) | ORAL | Status: DC
  Administered 2022-05-21 – 2022-05-28 (×14): 5 mg via ORAL
  Filled 2022-05-21 (×14): qty 1

## 2022-05-21 NOTE — Assessment & Plan Note (Addendum)
-  CKD3b -Creatinine stable near baseline

## 2022-05-21 NOTE — Progress Notes (Signed)
Remote ICD transmission.   

## 2022-05-21 NOTE — ED Provider Notes (Signed)
Assumed care from Dr Wilkie Aye. Pt remains hypoxic on room air with sats 85% at rest. No chest pain or discomfort. Wheezing bilaterally.  Additional neb treatment. Plan for admission. Hospitalists consulted for admission.    Cathren Laine, MD 05/21/22 [phone]

## 2022-05-21 NOTE — Assessment & Plan Note (Signed)
-  Continue nighttime Bipap -follows with pulmonology outpatient

## 2022-05-21 NOTE — Assessment & Plan Note (Signed)
BMI of 42 

## 2022-05-21 NOTE — ED Triage Notes (Signed)
Pt here from home with c/o sob that has got worse through out the week , sats 89 % on room air arrival , hx of chf

## 2022-05-21 NOTE — Assessment & Plan Note (Signed)
-  secondary to mild CHF exacerbation  -treatment with IV diuretics as below -hypoxic to 88% required 2L via [place] on presentation -wean as tolerated with goal of >94% on room air

## 2022-05-21 NOTE — Progress Notes (Signed)
PROGRESS NOTE    Erin [name]  [id number] DOB: 1945/07/20 DOA: 05/21/2022 PCP: Cain Saupe, MD   Brief Narrative:  Patient is a 77 year old female who presents to [facility] with complaints of shortness of breath dyspnea with exertion and orthopnea.  Labs remarkable for elevated BNP, chest x-ray shows increased vascularity and clinically patient appears to be in overload.  Viral panel negative.  No reported fevers chills sick contacts or recent travel.  Accepted in transfer to [facility] or [facility] whichever is available first.  She has no history of CAD, pulmonary hypertension, heart failure with reduced ejection fraction most recent echo February 24 shows EF of 35 to 40%.  V. tach status post AICD, persistent A-fib(on Eliquis however currently in sinus rhythm per signout) as well as amiodarone induced thyrotoxicosis and hypothyroidism.   Patient being admitted for: Acute hypoxic respiratory failure Heart failure exacerbation/volume overload   Objective: Vitals:   05/21/22 1430 05/21/22 1450 05/21/22 1500 05/21/22 1530  BP: 114/64  (!) 143/74 136/71  Pulse: 62  67 65  Resp: (!) 22  19 (!) 21  Temp:  98.5 F (36.9 C)    TempSrc:  Oral    SpO2: 95%  96% 94%    CBC: Recent Labs  Lab 05/21/22 1101  WBC 7.8  HGB 13.8  HCT 43.2  MCV 83.9  PLT 249   Basic Metabolic Panel: Recent Labs  Lab 05/21/22 1101  NA 139  K 3.8  CL 103  CO2 26  GLUCOSE 110*  BUN 26*  CREATININE 1.34*  CALCIUM 9.1     Recent Results (from the past 240 hour(s))  Resp panel by RT-PCR (RSV, Flu A&B, Covid) Anterior Nasal Swab     Status: None   Collection Time: 05/21/22 12:31 PM   Specimen: Anterior Nasal Swab  Result Value Ref Range Status   SARS Coronavirus 2 by RT PCR NEGATIVE NEGATIVE Final    Comment: (NOTE) SARS-CoV-2 target nucleic acids are NOT DETECTED.  The SARS-CoV-2 RNA is generally detectable in upper respiratory specimens during the acute phase of infection. The  lowest concentration of SARS-CoV-2 viral copies this assay can detect is 138 copies/mL. A negative result does not preclude SARS-Cov-2 infection and should not be used as the sole basis for treatment or other patient management decisions. A negative result may occur with  improper specimen collection/handling, submission of specimen other than nasopharyngeal swab, presence of viral mutation(s) within the areas targeted by this assay, and inadequate number of viral copies(<138 copies/mL). A negative result must be combined with clinical observations, patient history, and epidemiological information. The expected result is Negative.  Fact Sheet for Patients:  BloggerCourse.com  Fact Sheet for Healthcare Providers:  SeriousBroker.it  This test is no t yet approved or cleared by the [place] FDA and  has been authorized for detection and/or diagnosis of SARS-CoV-2 by FDA under an Emergency Use Authorization (EUA). This EUA will remain  in effect (meaning this test can be used) for the duration of the COVID-19 declaration under Section 564(b)(1) of the Act, 21 U.S.C.section 360bbb-3(b)(1), unless the authorization is terminated  or revoked sooner.       Influenza A by PCR NEGATIVE NEGATIVE Final   Influenza B by PCR NEGATIVE NEGATIVE Final    Comment: (NOTE) The Xpert Xpress SARS-CoV-2/FLU/RSV plus assay is intended as an aid in the diagnosis of influenza from Nasopharyngeal swab specimens and should not be used as a sole basis for treatment. Nasal washings and aspirates are  unacceptable for Xpert Xpress SARS-CoV-2/FLU/RSV testing.  Fact Sheet for Patients: BloggerCourse.com  Fact Sheet for Healthcare Providers: SeriousBroker.it  This test is not yet approved or cleared by the [place] FDA and has been authorized for detection and/or diagnosis of SARS-CoV-2 by FDA under  an Emergency Use Authorization (EUA). This EUA will remain in effect (meaning this test can be used) for the duration of the COVID-19 declaration under Section 564(b)(1) of the Act, 21 U.S.C. section 360bbb-3(b)(1), unless the authorization is terminated or revoked.     Resp Syncytial Virus by PCR NEGATIVE NEGATIVE Final    Comment: (NOTE) Fact Sheet for Patients: BloggerCourse.com  Fact Sheet for Healthcare Providers: SeriousBroker.it  This test is not yet approved or cleared by the [place] FDA and has been authorized for detection and/or diagnosis of SARS-CoV-2 by FDA under an Emergency Use Authorization (EUA). This EUA will remain in effect (meaning this test can be used) for the duration of the COVID-19 declaration under Section 564(b)(1) of the Act, 21 U.S.C. section 360bbb-3(b)(1), unless the authorization is terminated or revoked.  Performed at [facility], [address], [place], [place] [address]       Radiology Studies: DG Chest [facility] 1 View  Result Date: 05/21/2022 CLINICAL DATA:  Shortness of breath EXAM: PORTABLE CHEST 1 VIEW COMPARISON:  02/12/2022 FINDINGS: Dual lead left-sided implanted cardiac device remains in place. Stable cardiomegaly. Mildly prominent interstitial markings bilaterally. No focal airspace consolidation, pleural effusion, or pneumothorax. IMPRESSION: Cardiomegaly with mildly prominent interstitial markings, which may reflect mild interstitial edema. Electronically Signed   By: Duanne Guess D.O.   On: 05/21/2022 11:31      Azucena Fallen, DO [facility]  If 7PM-7AM, please contact night-coverage [email or web address]  05/21/2022, 4:13 PM

## 2022-05-21 NOTE — Assessment & Plan Note (Signed)
-  continue amiodarone, Eliquis, beta-blocker

## 2022-05-21 NOTE — ED Notes (Signed)
Handoff report given to carelink.  Attempt to give report to floor RN  Unavailable.  Will call back

## 2022-05-21 NOTE — ED Notes (Signed)
Trail of off O2.  Pulse ox dropped to 67%.  Placed back on 2 L via [place]

## 2022-05-21 NOTE — Addendum Note (Signed)
Addended by: Elease Etienne A on: 05/21/2022 09:12 AM   Modules accepted: Level of Service

## 2022-05-21 NOTE — Assessment & Plan Note (Signed)
-  continue diuretics, beta-blocker, [name]

## 2022-05-21 NOTE — Progress Notes (Signed)
   05/21/22 2102  BiPAP/CPAP/SIPAP  BiPAP/CPAP/SIPAP Pt Type Adult (prefers self placement)  BiPAP/CPAP/SIPAP  (home unit)  Mask Type Full face mask (from home)  Flow Rate 2 lpm  Heater Temperature  (sterile water provided)  Patient Home Equipment Yes  Auto Titrate Yes (Min/Max IPAP 13/18 EPAP 8)  BiPAP/CPAP /SiPAP Vitals  Bilateral Breath Sounds Diminished   Home unit. No frays in power cord. Appears to work appropriately. Plugged into red power outlet.

## 2022-05-21 NOTE — ED Provider Notes (Signed)
[place] EMERGENCY DEPARTMENT AT [facility] Provider Note   CSN: [id number] Arrival date & time: 05/21/22  1030     History  Chief Complaint  Patient presents with   Shortness of Breath    Erin [name] is a 77 y.o. female.  HPI   Very pleasant 77 year old female with past medical history of CHF on daily diuretic, paroxysmal atrial fibrillation on anticoagulation, ischemic cardiomyopathy with arrhythmia and pacemaker in place presents emergency department with concern for shortness of breath.  Patient states over the past 3 to 4 days she has been feeling weak, short of breath.  She endorses nasal congestion, postnasal drip and intermittently productive cough.  She uses CPAP at night but does not use any supplemental oxygen throughout the day.  Patient presents currently with worsening shortness of breath, chills and feeling febrile.  Denies any swelling of her lower extremities.  Has been compliant with her medications including her diuretic.  Denies any specific chest pain/back pain.  Home Medications Prior to Admission medications   Medication Sig Start Date End Date Taking? Authorizing Provider  levothyroxine (SYNTHROID) 50 MCG tablet Take 1 tablet (50 mcg total) by mouth daily. 03/26/22   Carlus Pavlov, MD  amiodarone (PACERONE) 200 MG tablet Take 0.5 tablets (100 mg total) by mouth daily. 05/06/22   Duke Salvia, MD  apixaban (ELIQUIS) 5 MG TABS tablet Take 1 tablet (5 mg total) by mouth 2 (two) times daily. 02/11/22   Duke Salvia, MD  atorvastatin (LIPITOR) 40 MG tablet TAKE 1 TABLET BY MOUTH  DAILY 10/13/21   Antonieta Iba, MD  clopidogrel (PLAVIX) 75 MG tablet TAKE 1 TABLET BY MOUTH DAILY 09/04/21   Antonieta Iba, MD  empagliflozin (JARDIANCE) 10 MG TABS tablet TAKE 1 TABLET BY MOUTH ONCE DAILY BEFORE BREAKFAST 02/17/22   Antonieta Iba, MD  ENTRESTO 24-26 MG Take 1 tablet by mouth twice daily 07/29/21   Antonieta Iba, MD  ezetimibe (ZETIA) 10 MG  tablet TAKE 1 TABLET BY MOUTH DAILY 09/04/21   Antonieta Iba, MD  fluticasone (FLONASE) 50 MCG/ACT nasal spray Place 1 spray into both nostrils at bedtime. 02/12/22   Coralyn Helling, MD  propranolol ER (INDERAL LA) 160 MG SR capsule TAKE 1 CAPSULE BY MOUTH DAILY 09/04/21   Antonieta Iba, MD  spironolactone (ALDACTONE) 25 MG tablet TAKE ONE-HALF TABLET BY MOUTH  DAILY 02/18/22   Antonieta Iba, MD  torsemide (DEMADEX) 20 MG tablet TAKE 2 TABLETS BY MOUTH DAILY Patient taking differently: Take 20 mg by mouth daily. 11/26/21   Antonieta Iba, MD      Allergies    Patient has no known allergies.    Review of Systems   Review of Systems  Constitutional:  Positive for chills and fatigue. Negative for fever.  HENT:  Positive for postnasal drip and rhinorrhea.   Respiratory:  Positive for cough and shortness of breath.   Cardiovascular:  Negative for chest pain, palpitations and leg swelling.  Gastrointestinal:  Negative for abdominal pain, diarrhea and vomiting.  Musculoskeletal:  Negative for back pain.  Skin:  Negative for rash.  Neurological:  Negative for headaches.    Physical Exam Updated Vital Signs BP (!) 146/113   Pulse 73   Temp 98.6 F (37 C) (Oral)   Resp (!) 22   SpO2 98%  Physical Exam Vitals and nursing note reviewed.  Constitutional:      General: She is not in acute distress.  Appearance: Normal appearance.  HENT:     Head: Normocephalic.     Mouth/Throat:     Mouth: Mucous membranes are moist.  Cardiovascular:     Rate and Rhythm: Normal rate.  Pulmonary:     Effort: Pulmonary effort is normal. Tachypnea present. No respiratory distress.     Breath sounds: Examination of the right-lower field reveals decreased breath sounds. Examination of the left-lower field reveals decreased breath sounds. Decreased breath sounds and rales present.  Abdominal:     Palpations: Abdomen is soft.     Tenderness: There is no abdominal tenderness.  Musculoskeletal:      Right lower leg: No edema.     Left lower leg: No edema.  Skin:    General: Skin is warm.  Neurological:     Mental Status: She is alert and oriented to person, place, and time. Mental status is at baseline.  Psychiatric:        Mood and Affect: Mood normal.     ED Results / Procedures / Treatments   Labs (all labs ordered are listed, but only abnormal results are displayed) Labs Reviewed  BASIC METABOLIC PANEL - Abnormal; Notable for the following components:      Result Value   Glucose, Bld 110 (*)    BUN 26 (*)    Creatinine, Ser 1.34 (*)    GFR, Estimated 41 (*)    All other components within normal limits  CBC - Abnormal; Notable for the following components:   RBC 5.15 (*)    All other components within normal limits  BRAIN NATRIURETIC PEPTIDE  TROPONIN I (HIGH SENSITIVITY)    EKG EKG Interpretation  Date/Time:  Thursday May 21 2022 11:09:53 EDT Ventricular Rate:  68 PR Interval:  196 QRS Duration: 98 QT Interval:  490 QTC Calculation: 522 R Axis:   53 Text Interpretation: Sinus rhythm Low voltage, extremity and precordial leads Prolonged QT interval Confirmed by Coralee Pesa [phone]) on 05/21/2022 11:43:56 AM  Radiology DG Chest Port 1 View  Result Date: 05/21/2022 CLINICAL DATA:  Shortness of breath EXAM: PORTABLE CHEST 1 VIEW COMPARISON:  02/12/2022 FINDINGS: Dual lead left-sided implanted cardiac device remains in place. Stable cardiomegaly. Mildly prominent interstitial markings bilaterally. No focal airspace consolidation, pleural effusion, or pneumothorax. IMPRESSION: Cardiomegaly with mildly prominent interstitial markings, which may reflect mild interstitial edema. Electronically Signed   By: Duanne Guess D.O.   On: 05/21/2022 11:31    Procedures .Critical Care  Performed by: Rozelle Logan, DO Authorized by: Rozelle Logan, DO   Critical care provider statement:    Critical care time (minutes):  30   Critical care time was exclusive of:   Separately billable procedures and treating other patients   Critical care was necessary to treat or prevent imminent or life-threatening deterioration of the following conditions:  Respiratory failure   Critical care was time spent personally by me on the following activities:  Development of treatment plan with patient or surrogate, discussions with consultants, evaluation of patient's response to treatment, examination of patient, ordering and review of laboratory studies, ordering and review of radiographic studies, ordering and performing treatments and interventions, pulse oximetry, re-evaluation of patient's condition and review of old charts     Medications Ordered in ED Medications  albuterol (PROVENTIL) (2.5 MG/3ML) 0.083% nebulizer solution 2.5 mg (2.5 mg Nebulization Given 05/21/22 1111)  ipratropium-albuterol (DUONEB) 0.5-2.5 (3) MG/3ML nebulizer solution 3 mL (3 mLs Nebulization Given 05/21/22 1111)    ED Course/ Medical  Decision Making/ A&P                             Medical Decision Making Amount and/or Complexity of Data Reviewed Labs: ordered. Radiology: ordered.  Risk Prescription drug management.   77 year old female presents emergency department with nasal congestion, shortness of breath, intermittently productive cough and weakness.  She was hypoxic on arrival with lowest oxygen 88% on room air, no acute respiratory distress.  Placed on 2 L nasal cannula.  Otherwise sitting up and conversational.  Patient has scattered wheezes bilaterally with mild rales at the bases.  No significant lower extremity pitting edema.  No ongoing chest pain.  After breathing treatments patient feels slightly improved and wheezes are improved.  EKG shows sinus rhythm.  Chest x-ray shows cardiomegaly with mild bilateral pulmonary congestion.  Blood work shows mild AKI, BNP is elevated around 450 and troponins are negative with no delta.  Viral respiratory panel is negative.  Will plan  for IV diuresis and reevaluation.  If patient does well and improves and is able to wean from oxygen we may consider the option of discharging home with antibiotics for what I presume is a bronchitis that caused a mild CHF exacerbation.  Patient signed out pending reevaluation.        Final Clinical Impression(s) / ED Diagnoses Final diagnoses:  None    Rx / DC Orders ED Discharge Orders     None         Rozelle Logan, DO 05/21/22 1451

## 2022-05-21 NOTE — Assessment & Plan Note (Signed)
-  continue levothyroxine -has hx of amiodarone induced thyrotoxicosis and is followed by endocrinology outpatient

## 2022-05-21 NOTE — Assessment & Plan Note (Signed)
Hx of VT and NICM

## 2022-05-21 NOTE — H&P (Signed)
History and Physical    Patient: Erin [name] WUJ:[id number] DOB: 10/12/1945 DOA: 05/21/2022 DOS: the patient was seen and examined on 05/21/2022 PCP: Cain Saupe, MD  Patient coming from:  [facility]  Chief Complaint:  Chief Complaint  Patient presents with   Shortness of Breath   HPI: Erin [name] is a 77 y.o. female with medical history significant of nonischemic cardiomyopathy, VT s/p ICD, persistent atrial fibrillation on apixaban, CAD s/p stent on Clopidogrel, amiodarone induced thyrotoxicosis, and morbid obesity who presents to outside ED with dyspnea and acute hypoxic respiratory failure.   About 4 days ago had sore throat, post-nasal drip. However today woke up feeling flushed and could not breath. Better at rest but worse with exertion.Has some chest tightness at times but no pain. She has been feeling dyspnea on and off at least since February and has planned PFT later this month. Denies LE edema or calf. She did travel mid-April to [place], [place] with breaks in between the drive. No tobacco use history. Reports compliance with her Torsemide. She was taking it BID but reports months ago this was cut down to once daily. No change in her usual urine output.   In the ED, she was afebrile, normotensive but hypoxic to 88% on room air and placed on 2L via [place]. BNP of 451. Troponin flat at 6 x 2.   Creatinine elevated but better than usual baseline at 1.34.  Negative COVID/Flu/RSV  CXR cardiomegaly with mild interstitial edema. EKG on my review with low voltage, sinus rhythm and prolonged Qtc 522.   She received multiple duonebs and IV solu medrols in ED and reports improvement in her symptoms. Also given IV 40mg  Lasix.  Then transferred to [facility] for continual management.   Review of Systems: As mentioned in the history of present illness. All other systems reviewed and are negative. Past Medical History:  Diagnosis Date   Anginal pain (HCC)    Automatic  implantable cardioverter-defibrillator in situ    a. s/p MDT ICD 07/2012; b. SN # [id number]    CAD (coronary artery disease)    a. cath 2003: LM nl, mid LAD 50%, LCx nl, RCA nl b. L&RHC 08/17/2014 100% occluded mid LCx, 70% mid LAD with FFR 0.82. Medical therapy. CI 2.1. CO 4.01c. LHC 8/18: CTO LCx w/ R-L colatts, LAD s/p PCI/DES x 2   Chronic systolic CHF (congestive heart failure) (HCC)    a. echo 2014: EF 25%, diffuse HK, LA moderately dilated, RA mildly dilated, PASP 38 mm Hg; b. echo 08/2014: EF 25-30%, diffuse HK, RWMA cannot be excluded, mild MR, mild biatrial enlargement, RV mildly dilated, wall thickness nl, pacer wire/catheter noted, mild to mod TR, PASP high nl, c. TTE 8/18: EF 25-30%, diffuse HK, GR1DD, mod MR, mod dilated LA, nl RV sys fxn, PASP 56   Encounter for long-term (current) use of anticoagulants    Exertional shortness of breath    H/O medication noncompliance    High cholesterol    Hypertension    NICM (nonischemic cardiomyopathy) (HCC)    a. s/p AICD 07/22/2012   Obesity    OSA on CPAP    "suppose to; not often" (07/22/2012)   Other "heavy-for-dates" infants    PAF (paroxysmal atrial fibrillation) (HCC)    a. on warfarin--> Eliquis 08/2014   Past Surgical History:  Procedure Laterality Date   ABDOMINAL HYSTERECTOMY  ~ 1998   "laparoscopic" (07/22/2012)   CARDIAC CATHETERIZATION  ? 1990's   CARDIAC  CATHETERIZATION N/A 08/17/2014   Procedure: Right/Left Heart Cath and Coronary Angiography;  Surgeon: Kathleene Hazel, MD;  Location[facility] INVASIVE CV LAB;  Service: Cardiovascular;  Laterality: N/A;   CARDIAC DEFIBRILLATOR PLACEMENT  07/22/2012   dual-chamber ICD.   CARDIOVERSION N/A 01/13/2019   Procedure: CARDIOVERSION;  Surgeon: Antonieta Iba, MD;  Location: [facility] ORS;  Service: Cardiovascular;  Laterality: N/A;   CORONARY STENT INTERVENTION N/A 08/24/2016   Procedure: CORONARY STENT INTERVENTION;  Surgeon: Iran Ouch, MD;  Location: [facility] INVASIVE CV LAB;   Service: Cardiovascular;  Laterality: N/A;   IMPLANTABLE CARDIOVERTER DEFIBRILLATOR IMPLANT N/A 07/22/2012   Procedure: IMPLANTABLE CARDIOVERTER DEFIBRILLATOR IMPLANT;  Surgeon: Marinus Maw, MD;  Location: [facility] CATH LAB;  Service: Cardiovascular;  Laterality: N/A;   LEFT HEART CATH AND CORONARY ANGIOGRAPHY N/A 08/24/2016   Procedure: LEFT HEART CATH AND CORONARY ANGIOGRAPHY;  Surgeon: Iran Ouch, MD;  Location: [facility] INVASIVE CV LAB;  Service: Cardiovascular;  Laterality: N/A;   TEE WITHOUT CARDIOVERSION N/A 08/21/2014   Procedure: TRANSESOPHAGEAL ECHOCARDIOGRAM (TEE);  Surgeon: Chilton Si, MD;  Location: [facility] ENDOSCOPY;  Service: Cardiovascular;  Laterality: N/A;   TUBAL LIGATION  ~ 1978   Social History:  reports that she has never smoked. She has never used smokeless tobacco. She reports that she does not drink alcohol and does not use drugs.  No Known Allergies  Family History  Problem Relation Age of Onset   Heart disease Mother    Heart disease Father    Lung disease Sister    Diabetes Sister    Heart failure Brother    Thyroid disease Neg Hx     Prior to Admission medications   Medication Sig Start Date End Date Taking? Authorizing Provider  amiodarone (PACERONE) 200 MG tablet Take 0.5 tablets (100 mg total) by mouth daily. 05/06/22  Yes Duke Salvia, MD  apixaban (ELIQUIS) 5 MG TABS tablet Take 1 tablet (5 mg total) by mouth 2 (two) times daily. 02/11/22  Yes Duke Salvia, MD  atorvastatin (LIPITOR) 40 MG tablet TAKE 1 TABLET BY MOUTH  DAILY 10/13/21  Yes Gollan, Tollie Pizza, MD  clopidogrel (PLAVIX) 75 MG tablet TAKE 1 TABLET BY MOUTH DAILY 09/04/21  Yes Gollan, Tollie Pizza, MD  empagliflozin (JARDIANCE) 10 MG TABS tablet TAKE 1 TABLET BY MOUTH ONCE DAILY BEFORE BREAKFAST 02/17/22  Yes Antonieta Iba, MD  ENTRESTO 24-26 MG Take 1 tablet by mouth twice daily 07/29/21  Yes Gollan, Tollie Pizza, MD  ezetimibe (ZETIA) 10 MG tablet TAKE 1 TABLET BY MOUTH DAILY 09/04/21  Yes  Gollan, Tollie Pizza, MD  fluticasone (FLONASE) 50 MCG/ACT nasal spray Place 1 spray into both nostrils at bedtime. 02/12/22  Yes Coralyn Helling, MD  levothyroxine (SYNTHROID) 50 MCG tablet Take 1 tablet (50 mcg total) by mouth daily. 03/26/22  Yes Carlus Pavlov, MD  propranolol ER (INDERAL LA) 160 MG SR capsule TAKE 1 CAPSULE BY MOUTH DAILY 09/04/21  Yes Gollan, Tollie Pizza, MD  spironolactone (ALDACTONE) 25 MG tablet TAKE ONE-HALF TABLET BY MOUTH  DAILY 02/18/22  Yes Gollan, Tollie Pizza, MD  torsemide (DEMADEX) 20 MG tablet TAKE 2 TABLETS BY MOUTH DAILY Patient taking differently: Take 20 mg by mouth daily. 11/26/21  Yes Antonieta Iba, MD    Physical Exam: Vitals:   05/21/22 1530 05/21/22 1700 05/21/22 1757 05/21/22 1800  BP: 136/71 133/71 (!) 130/47 (!) 111/48  Pulse: 65 68 67 67  Resp: (!) 21 20 20 19   Temp:  98.2 F (36.8 C)   TempSrc:   Oral   SpO2: 94% 96% 99% 100%  Weight:   95.6 kg   Height:   4\' 11"  (1.499 m)     Constitutional: NAD, calm, comfortable, morbidly obese female sitting upright on side of bed after eating Eyes:  lids and conjunctivae normal ENMT: Mucous membranes are moist.  Neck: normal, supple, Respiratory:diminished breath sounds throughout but no wheezing, no crackles. Normal respiratory effort. No accessory muscle use. Able to speak in full sentences. On 2L via [place].  Cardiovascular: Regular rate and rhythm, no murmurs / rubs / gallops. No extremity edema.  Abdomen:soft, non-tender, Bowel sounds positive.  Musculoskeletal: no clubbing / cyanosis. No joint deformity upper and lower extremities. Good ROM, no contractures. Normal muscle tone.  Skin: no rashes, lesions, ulcers.  Neurologic: CN 2-12 grossly intact.  Strength 5/5 in all 4.  Psychiatric: Normal judgment and insight. Alert and oriented x 3. Normal mood. Data Reviewed:  See HPI  Assessment and Plan: * Acute respiratory failure with hypoxia (HCC) -secondary to mild CHF exacerbation  -treatment with  IV diuretics as below -hypoxic to 88% required 2L via [place] on presentation -wean as tolerated with goal of >94% on room air  Acute on chronic combined systolic and diastolic CHF (congestive heart failure) (HCC) -last echo 02/2022 with EF 35 to 40%. The left ventricle has moderately decreased function. The left ventricle demonstrates global hypokinesis. There is moderate left ventricular hypertrophy of the apical segment. Left ventricular diastolic parameters are consistent with Grade I diastolic dysfunction. -could be exacerbated by recent URI -Home regimen with 20mg  Torsemide and spirolactone -continue spirolactone -hold Torsemide and do IV Lasix 40mg  daily  -follow intake and output -daily weights  Obesity, Class III, BMI 40-49.9 (morbid obesity) (HCC) -BMI of 42  Hypothyroidism -continue levothyroxine -has hx of amiodarone induced thyrotoxicosis and is followed by endocrinology outpatient  Persistent atrial fibrillation (HCC) -continue amiodarone, Eliquis, beta-blocker  CKD (chronic kidney disease) stage 3, GFR 30-59 ml/min (HCC) -CKD3b -Creatinine stable near baseline  Essential hypertension -continue diuretics, beta-blocker, Entresto  AICD (automatic cardioverter/defibrillator) present Hx of VT and NICM  Complex sleep apnea syndrome -Continue nighttime Bipap -follows with pulmonology outpatient      Advance Care Planning: Full  Consults: none  Family Communication: daughter at bedside  Severity of Illness: The appropriate patient status for this patient is INPATIENT. Inpatient status is judged to be reasonable and necessary in order to provide the required intensity of service to ensure the patient's safety. The patient's presenting symptoms, physical exam findings, and initial radiographic and laboratory data in the context of their chronic comorbidities is felt to place them at high risk for further clinical deterioration. Furthermore, it is not anticipated that the  patient will be medically stable for discharge from the hospital within 2 midnights of admission.   * I certify that at the point of admission it is my clinical judgment that the patient will require inpatient hospital care spanning beyond 2 midnights from the point of admission due to high intensity of service, high risk for further deterioration and high frequency of surveillance required.*  Author: Anselm Jungling, DO 05/21/2022 8:25 PM  For on call review [email or web address].

## 2022-05-21 NOTE — Assessment & Plan Note (Addendum)
-  last echo 02/2022 with EF 35 to 40%. The left ventricle has moderately decreased function. The left ventricle demonstrates global hypokinesis. There is moderate left ventricular hypertrophy of the apical segment. Left ventricular diastolic parameters are consistent with Grade I diastolic dysfunction. -could be exacerbated by recent URI -Home regimen with 20mg  Torsemide and spirolactone -continue spirolactone -hold Torsemide and do IV Lasix 40mg  daily  -follow intake and output -daily weights

## 2022-05-21 NOTE — ED Notes (Signed)
Greggory Stallion at [facility] will send transport as soon as available.-ABB(NS)

## 2022-05-22 DIAGNOSIS — G4733 Obstructive sleep apnea (adult) (pediatric): Secondary | ICD-10-CM | POA: Diagnosis not present

## 2022-05-22 DIAGNOSIS — J9601 Acute respiratory failure with hypoxia: Secondary | ICD-10-CM | POA: Diagnosis not present

## 2022-05-22 DIAGNOSIS — R058 Other specified cough: Secondary | ICD-10-CM | POA: Diagnosis not present

## 2022-05-22 LAB — BASIC METABOLIC PANEL
Anion gap: 10 (ref 5–15)
BUN: 27 mg/dL — ABNORMAL HIGH (ref 8–23)
CO2: 25 mmol/L (ref 22–32)
Calcium: 8.7 mg/dL — ABNORMAL LOW (ref 8.9–10.3)
Chloride: 103 mmol/L (ref 98–111)
Creatinine, Ser: 1.38 mg/dL — ABNORMAL HIGH (ref 0.44–1.00)
GFR, Estimated: 39 mL/min — ABNORMAL LOW (ref >60)
Glucose, Bld: 139 mg/dL — ABNORMAL HIGH (ref 70–99)
Potassium: 3.7 mmol/L (ref 3.5–5.1)
Sodium: 138 mmol/L (ref 135–145)

## 2022-05-22 LAB — PHOSPHORUS: Phosphorus: 3.6 mg/dL (ref 2.5–4.6)

## 2022-05-22 LAB — MAGNESIUM: Magnesium: 2.3 mg/dL (ref 1.7–2.4)

## 2022-05-22 NOTE — Progress Notes (Signed)
   05/22/22 1947  BiPAP/CPAP/SIPAP  BiPAP/CPAP/SIPAP Pt Type Adult (prefers self placement)  BiPAP/CPAP/SIPAP  (Home Machine)  Mask Type Full face mask (from home)  Flow Rate 2 lpm  Heater Temperature  (sterile water provided)  Patient Home Equipment Yes  Auto Titrate Yes (Min-Max IPAP 13-18 EPAP 8)  BiPAP/CPAP /SiPAP Vitals  Resp (!) 24  MEWS Score/Color  MEWS Score 1  MEWS Score Color Green

## 2022-05-22 NOTE — Hospital Course (Signed)
77 y.o. female with medical history significant of nonischemic cardiomyopathy, VT s/p ICD, persistent atrial fibrillation on apixaban, CAD s/p stent on Clopidogrel, amiodarone induced thyrotoxicosis, and morbid obesity who presents to outside ED with dyspnea and acute hypoxic respiratory failure.    About 4 days ago had sore throat, post-nasal drip. However today woke up feeling flushed and could not breath. Better at rest but worse with exertion.Has some chest tightness at times but no pain. She has been feeling dyspnea on and off at least since February and has planned PFT later this month. Denies LE edema or calf. She did travel mid-April to [place], [place] with breaks in between the drive. No tobacco use history. Reports compliance with her Torsemide. She was taking it BID but reports months ago this was cut down to once daily. No change in her usual urine output

## 2022-05-22 NOTE — Progress Notes (Signed)
Chaplain engaged in an initial visit with Erin [name]. She discussed her healthcare journey, family, and spiritual community. Chaplain offered prayer and support to Erin [name].     05/22/22 1000  Spiritual Encounters  Type of Visit Initial  Care provided to: Patient  Reason for visit Routine spiritual support  Interventions  Spiritual Care Interventions Made Prayer;Compassionate presence;Established relationship of care and support;Encouragement

## 2022-05-22 NOTE — TOC Initial Note (Addendum)
Transition of Care [facility]) - Initial/Assessment Note    Patient Details  Name: Erin [name] MRN: [id number] Date of Birth: [birth year]  Transition of Care [facility]) CM/SW Contact:    Lavenia Atlas, RN Phone Number: 05/22/2022, 6:34 PM  Clinical Narrative:    Per chart review patient presented to [facility] ED w/shortness of breath, currently in [facility] ICU being treated for acute respiratory failure w/hypoxia.  Received TOC consult for HF screening, patient does not meet CHF protocol. PT recommends HHPT. This RNCM spoke with patient to offer choice for HHPT and patient will accept any HH agency that will accept her insurance. This RNCM advised patient that her insurance can be challenging to get [facility] agency to accept for [facility] services, patient verbalized understanding. This RNCM notified Kandee Keen w/ [facility] (declined), Donalee Citrin, Ephriam Knuckles w/[employer]. Awaiting acceptance response.   Transportation at discharge: patient's daughter  [facility] following for needs. .                Expected Discharge Plan: Home w Home Health Services Barriers to Discharge: Continued Medical Work up   Patient Goals and CMS Choice Patient states their goals for this hospitalization and ongoing recovery are:: return home CMS Medicare.gov Compare Post Acute Care list provided to:: Patient Choice offered to / list presented to : Patient [place] ownership interest in [facility].provided to:: Patient    Expected Discharge Plan and Services In-house Referral: NA Discharge Planning Services: CM Consult Post Acute Care Choice: Home Health Living arrangements for the past 2 months: Apartment                 DME Arranged: N/A DME Agency: NA                  Prior Living Arrangements/Services Living arrangements for the past 2 months: Apartment Lives with:: Adult Children (Daughter: Engineer, materials) Patient language and need for interpreter reviewed:: Yes Do you feel safe going back to the place where you  live?: Yes      Need for Family Participation in Patient Care: No (Comment) Care giver support system in place?: Yes (comment) Current home services: DME (DME: walker) Criminal Activity/Legal Involvement Pertinent to Current Situation/Hospitalization: No - Comment as needed  Activities of Daily Living Home Assistive Devices/Equipment: CPAP, Walker (specify type), Dentures (specify type) ADL Screening (condition at time of admission) Patient's cognitive ability adequate to safely complete daily activities?: Yes Is the patient deaf or have difficulty hearing?: No Does the patient have difficulty seeing, even when wearing glasses/contacts?: No Does the patient have difficulty concentrating, remembering, or making decisions?: No Patient able to express need for assistance with ADLs?: Yes Does the patient have difficulty dressing or bathing?: No Independently performs ADLs?: Yes (appropriate for developmental age) Does the patient have difficulty walking or climbing stairs?: Yes Weakness of Legs: None Weakness of Arms/Hands: None  Permission Sought/Granted Permission sought to share information with : Case Manager Permission granted to share information with : Yes, Verbal Permission Granted  Share Information with NAME: Care Manager           Emotional Assessment Appearance:: Appears stated age Attitude/Demeanor/Rapport: Gracious Affect (typically observed): Accepting Orientation: : Oriented to Self, Oriented to Place, Oriented to  Time, Oriented to Situation Alcohol / Substance Use: Not Applicable Psych Involvement: No (comment)  Admission diagnosis:  Shortness of breath [R06.02] Wheezing [R06.2] OSA (obstructive sleep apnea) [G47.33] Pulmonary hypertension (HCC) [I27.20] Non-productive cough [R05.8] Acute respiratory failure with hypoxia (HCC) [J96.01]  Patient Active Problem List   Diagnosis Date Noted   Acute respiratory failure with hypoxia (HCC) 05/21/2022    Hypothyroidism 05/21/2022   Obesity, Class III, BMI 40-49.9 (morbid obesity) (HCC) 05/21/2022   Persistent atrial fibrillation (HCC) 07/03/2021   PVCs (premature ventricular contractions) 07/03/2021   Hyperthyroidism 01/12/2019   High risk medication use 06/28/2018   Unstable angina (HCC)    Ventricular tachycardia (HCC) 08/20/2016   Ventricular fibrillation (HCC) 08/19/2016   CKD (chronic kidney disease) stage 3, GFR 30-59 ml/min (HCC) 08/19/2016   Chronic systolic heart failure (HCC) 08/30/2014   Atherosclerosis of native coronary artery of native heart with stable angina pectoris (HCC)    Anemia 08/15/2014   Acute on chronic combined systolic and diastolic CHF (congestive heart failure) (HCC) 08/15/2014   H/O medication noncompliance    PAF (paroxysmal atrial fibrillation) (HCC)    Atrial fibrillation with RVR (HCC) 08/13/2014   Chronic systolic CHF (congestive heart failure) (HCC) 09/30/2012   Essential hypertension 08/24/2012   NICM (nonischemic cardiomyopathy) (HCC) 07/23/2012   AICD (automatic cardioverter/defibrillator) present 07/23/2012   Long term current use of anticoagulant therapy 07/23/2010   Complex sleep apnea syndrome 11/20/2007   CORONARY ATHEROSCLEROSIS NATIVE CORONARY ARTERY 11/20/2007   Pulmonary HTN (HCC) 11/20/2007   PCP:  Cain Saupe, MD Pharmacy:   [facility] [address] [place] - [address] [place] [address] Phone: [phone] Fax: [phone]  [facility]) [facility] [place], [place] - [address] [place] [address] Phone: [phone] Fax: [phone]  [id number] [place], [place] - [address] [facility] [address] Phone: [phone] Fax: [phone]  [facility] - [place], [place] - [address] [place] [address] Phone:  [phone] Fax: [phone]  [facility] [address], [place] - [address] [place] [address] Phone: [phone] Fax: [phone]  [id number] [place], [place] - [address] [place] [address] Phone: [phone] Fax: [phone]     Social Determinants of Health (SDOH) Social History: SDOH Screenings   Food Insecurity: No Food Insecurity (05/21/2022)  Housing: Low Risk  (05/21/2022)  Transportation Needs: No Transportation Needs (05/21/2022)  Utilities: Not At Risk (05/21/2022)  Tobacco Use: Low Risk  (05/21/2022)   SDOH Interventions:     Readmission Risk Interventions    05/22/2022    6:23 PM  Readmission Risk Prevention Plan  Transportation Screening Complete  PCP or Specialist Appt within 5-7 Days Complete  Home Care Screening Complete  Medication Review (RN CM) Complete

## 2022-05-22 NOTE — Progress Notes (Signed)
  Progress Note   Patient: Erin [name] MVH:[id number] DOB: 1945/03/29 DOA: 05/21/2022     1 DOS: the patient was seen and examined on 05/22/2022   Brief hospital course: 77 y.o. female with medical history significant of nonischemic cardiomyopathy, VT s/p ICD, persistent atrial fibrillation on apixaban, CAD s/p stent on Clopidogrel, amiodarone induced thyrotoxicosis, and morbid obesity who presents to outside ED with dyspnea and acute hypoxic respiratory failure.    About 4 days ago had sore throat, post-nasal drip. However today woke up feeling flushed and could not breath. Better at rest but worse with exertion.Has some chest tightness at times but no pain. She has been feeling dyspnea on and off at least since February and has planned PFT later this month. Denies LE edema or calf. She did travel mid-April to [place], [place] with breaks in between the drive. No tobacco use history. Reports compliance with her Torsemide. She was taking it BID but reports months ago this was cut down to once daily. No change in her usual urine output  Assessment and Plan: Acute respiratory failure with hypoxia (HCC) -Presented hypoxic to 88% required 2L via [place] on presentation -wean as tolerated with goal of >94% on room air, currenlty still requiring 3LNC -Continue IV lasix as tolerated   Acute on chronic combined systolic and diastolic CHF (congestive heart failure) (HCC) -last echo 02/2022 with EF 35 to 40%. The left ventricle has moderately decreased function. The left ventricle demonstrates global hypokinesis. There is moderate left ventricular hypertrophy of the apical segment. Left ventricular diastolic parameters are consistent with Grade I diastolic dysfunction. -could be exacerbated by recent URI - Pt had been on 20mg  Torsemide and spirolactone PTA -continue spirolactone -hold Torsemide and continue IV Lasix 40mg  daily  -f/u on daily wts and I/o   Obesity, Class III, BMI 40-49.9 (morbid obesity)  (HCC) -BMI of 42   Hypothyroidism -continue levothyroxine -has hx of amiodarone induced thyrotoxicosis and is followed by endocrinology outpatient   Persistent atrial fibrillation (HCC) -continue amiodarone, Eliquis, beta-blocker   CKD (chronic kidney disease) stage 3, GFR 30-59 ml/min (HCC) -CKD3b -Creatinine stable near baseline   Essential hypertension -continue diuretics, beta-blocker, Entresto   AICD (automatic cardioverter/defibrillator) present Hx of VT and NICM   Complex sleep apnea syndrome -Continue nighttime Bipap -follows with pulmonology outpatient   Subjective: Reports breathing somewhat better. Still mildly sob over baseline  Physical Exam: Vitals:   05/22/22 1304 05/22/22 1320 05/22/22 1321 05/22/22 1400  BP:  (!) 95/52  (!) 105/48  Pulse:  72  65  Resp:  20  (!) 21  Temp:      TempSrc:      SpO2: 95% (!) 86% 95% 97%  Weight:      Height:       General exam: Awake, laying in bed, in nad Respiratory system: Normal respiratory effort, no wheezing, coarse breathsounds B Cardiovascular system: regular rate, s1, s2 Gastrointestinal system: Soft, nondistended, positive BS Central nervous system: CN2-12 grossly intact, strength intact Extremities: Perfused, no clubbing Skin: Normal skin turgor, no notable skin lesions seen Psychiatry: Mood normal // no visual hallucinations   Data Reviewed:  Labs reviewed: Na 138, K 3.7, Cr 1.38  Family Communication: Pt in room, family not at bedside  Disposition: Status is: Inpatient Remains inpatient appropriate because: Severity of illness  Planned Discharge Destination: Home    Author: Rickey Barbara, MD 05/22/2022 2:31 PM  For on call review [email or web address].

## 2022-05-22 NOTE — Evaluation (Signed)
Physical Therapy Evaluation Patient Details Name: Erin [name] MRN: [id number] DOB: July 18, 1945 Today's Date: 05/22/2022  History of Present Illness  77 y.o. female with medical history significant of nonischemic cardiomyopathy, VT s/p ICD, persistent atrial fibrillation on apixaban, CAD s/p stent on Clopidogrel, amiodarone induced thyrotoxicosis, and morbid obesity who presents to outside ED with dyspnea and acute hypoxic respiratory failure. Dx of acute on chronic CHF.  Clinical Impression  Pt admitted with above diagnosis. Pt ambulated 15' x 2 with hand held assist of 1, SpO2 95% on 4L O2 walking, distance limited by 3/4 dyspnea. At baseline pt ambulates without an assistive device in the home and uses a rollator when going out of the home, she denies falls in the past 6 months.  Pt currently with functional limitations due to the deficits listed below (see PT Problem List). Pt will benefit from acute skilled PT to increase their independence and safety with mobility to allow discharge.          Recommendations for follow up therapy are one component of a multi-disciplinary discharge planning process, led by the attending physician.  Recommendations may be updated based on patient status, additional functional criteria and insurance authorization.  Follow Up Recommendations       Assistance Recommended at Discharge Intermittent Supervision/Assistance  Patient can return home with the following  A little help with walking and/or transfers;A little help with bathing/dressing/bathroom;Assistance with cooking/housework;Assist for transportation;Help with stairs or ramp for entrance    Equipment Recommendations None recommended by PT  Recommendations for Other Services       Functional Status Assessment Patient has had a recent decline in their functional status and demonstrates the ability to make significant improvements in function in a reasonable and predictable amount of time.      Precautions / Restrictions Precautions Precautions: Other (comment) Precaution Comments: monitor O2, denies falls in past 6 months Restrictions Weight Bearing Restrictions: No      Mobility  Bed Mobility               General bed mobility comments: up in recliner    Transfers Overall transfer level: Needs assistance Equipment used: 1 person hand held assist Transfers: Sit to/from Stand Sit to Stand: Min assist           General transfer comment: increased time, min A to power up, used armrests    Ambulation/Gait Ambulation/Gait assistance: Min assist Gait Distance (Feet): 15 Feet Assistive device: 1 person hand held assist Gait Pattern/deviations: Step-through pattern, Decreased stride length Gait velocity: WFL     General Gait Details: 15' x 2 (with seated rest break) min HHA for balance, SpO2 95% on 4L O2 walking, distance limited by 3/4 dyspnea, VCs for pursed lip breathing  Stairs            Wheelchair Mobility    Modified Rankin (Stroke Patients Only)       Balance Overall balance assessment: Needs assistance Sitting-balance support: Feet supported, No upper extremity supported Sitting balance-Leahy Scale: Good     Standing balance support: No upper extremity supported Standing balance-Leahy Scale: Fair                               Pertinent Vitals/Pain Pain Assessment Pain Assessment: No/denies pain    Home Living Family/patient expects to be discharged to:: Private residence Living Arrangements: Children Available Help at Discharge: Family;Available PRN/intermittently Type of Home: Apartment Home Access: Stairs  to enter Entrance Stairs-Rails: Right Entrance Stairs-Number of Steps: 5   Home Layout: One level Home Equipment: Rollator (4 wheels)      Prior Function Prior Level of Function : Independent/Modified Independent             Mobility Comments: no AD in home, uses rollator going out of home ADLs  Comments: independent with bathing/dressing; does not drive 2* having defibrillator     Hand Dominance        Extremity/Trunk Assessment   Upper Extremity Assessment Upper Extremity Assessment: Overall WFL for tasks assessed    Lower Extremity Assessment Lower Extremity Assessment: Overall WFL for tasks assessed    Cervical / Trunk Assessment Cervical / Trunk Assessment: Normal  Communication   Communication: No difficulties  Cognition Arousal/Alertness: Awake/alert Behavior During Therapy: WFL for tasks assessed/performed Overall Cognitive Status: Within Functional Limits for tasks assessed                                          General Comments      Exercises     Assessment/Plan    PT Assessment Patient needs continued PT services  PT Problem List Decreased activity tolerance;Decreased balance       PT Treatment Interventions Patient/family education;Therapeutic activities;Gait training;DME instruction;Therapeutic exercise    PT Goals (Current goals can be found in the Care Plan section)  Acute Rehab PT Goals Patient Stated Goal: to be able to walk farther PT Goal Formulation: With patient Time For Goal Achievement: 06/05/22 Potential to Achieve Goals: Good    Frequency Min 1X/week     Co-evaluation               AM-PAC PT "6 Clicks" Mobility  Outcome Measure Help needed turning from your back to your side while in a flat bed without using bedrails?: A Little Help needed moving from lying on your back to sitting on the side of a flat bed without using bedrails?: A Little Help needed moving to and from a bed to a chair (including a wheelchair)?: A Little Help needed standing up from a chair using your arms (e.g., wheelchair or bedside chair)?: A Little Help needed to walk in hospital room?: A Little Help needed climbing 3-5 steps with a railing? : A Little 6 Click Score: 18    End of Session Equipment Utilized During  Treatment: Oxygen Activity Tolerance: Patient limited by fatigue Patient left: in chair;with call bell/phone within reach;with nursing/sitter in room Nurse Communication: Mobility status PT Visit Diagnosis: Difficulty in walking, not elsewhere classified (R26.2)    Time: 1610-9604 PT Time Calculation (min) (ACUTE ONLY): 18 min   Charges:   PT Evaluation $PT Eval Moderate Complexity: 1 Mod          Tamala Ser PT 05/22/2022  Acute Rehabilitation Services  Office [phone]

## 2022-05-23 ENCOUNTER — Inpatient Hospital Stay (HOSPITAL_COMMUNITY): Payer: Medicare Other

## 2022-05-23 DIAGNOSIS — J9601 Acute respiratory failure with hypoxia: Secondary | ICD-10-CM | POA: Diagnosis not present

## 2022-05-23 DIAGNOSIS — R058 Other specified cough: Secondary | ICD-10-CM | POA: Diagnosis not present

## 2022-05-23 DIAGNOSIS — G4733 Obstructive sleep apnea (adult) (pediatric): Secondary | ICD-10-CM | POA: Diagnosis not present

## 2022-05-23 LAB — CBC WITH DIFFERENTIAL/PLATELET
Abs Immature Granulocytes: 0.08 10*3/uL — ABNORMAL HIGH (ref 0.00–0.07)
Basophils Absolute: 0.1 10*3/uL (ref 0.0–0.1)
Basophils Relative: 0 %
Eosinophils Absolute: 0 10*3/uL (ref 0.0–0.5)
Eosinophils Relative: 0 %
HCT: 46.1 % — ABNORMAL HIGH (ref 36.0–46.0)
Hemoglobin: 14.4 g/dL (ref 12.0–15.0)
Immature Granulocytes: 1 %
Lymphocytes Relative: 30 %
Lymphs Abs: 5.1 10*3/uL — ABNORMAL HIGH (ref 0.7–4.0)
MCH: 26.7 pg (ref 26.0–34.0)
MCHC: 31.2 g/dL (ref 30.0–36.0)
MCV: 85.5 fL (ref 80.0–100.0)
Monocytes Absolute: 2.5 10*3/uL — ABNORMAL HIGH (ref 0.1–1.0)
Monocytes Relative: 15 %
Neutro Abs: 9.3 10*3/uL — ABNORMAL HIGH (ref 1.7–7.7)
Neutrophils Relative %: 54 %
Platelets: 296 10*3/uL (ref 150–400)
RBC: 5.39 MIL/uL — ABNORMAL HIGH (ref 3.87–5.11)
RDW: 15.1 % (ref 11.5–15.5)
WBC: 17.1 10*3/uL — ABNORMAL HIGH (ref 4.0–10.5)
nRBC: 0.1 % (ref 0.0–0.2)

## 2022-05-23 LAB — BLOOD GAS, ARTERIAL
Acid-base deficit: 0.6 mmol/L (ref 0.0–2.0)
Bicarbonate: 26.3 mmol/L (ref 20.0–28.0)
Delivery systems: POSITIVE
Drawn by: 23532
Expiratory PAP: 8 cmH2O
FIO2: 0.4 %
Inspiratory PAP: 18 cmH2O
Mode: POSITIVE
O2 Saturation: 99.4 %
Patient temperature: 36.8
pCO2 arterial: 51 mmHg — ABNORMAL HIGH (ref 32–48)
pH, Arterial: 7.32 — ABNORMAL LOW (ref 7.35–7.45)
pO2, Arterial: 133 mmHg — ABNORMAL HIGH (ref 83–108)

## 2022-05-23 LAB — COMPREHENSIVE METABOLIC PANEL
ALT: 50 U/L — ABNORMAL HIGH (ref 0–44)
AST: 51 U/L — ABNORMAL HIGH (ref 15–41)
Albumin: 4 g/dL (ref 3.5–5.0)
Alkaline Phosphatase: 74 U/L (ref 38–126)
Anion gap: 10 (ref 5–15)
BUN: 36 mg/dL — ABNORMAL HIGH (ref 8–23)
CO2: 25 mmol/L (ref 22–32)
Calcium: 8.7 mg/dL — ABNORMAL LOW (ref 8.9–10.3)
Chloride: 99 mmol/L (ref 98–111)
Creatinine, Ser: 1.78 mg/dL — ABNORMAL HIGH (ref 0.44–1.00)
GFR, Estimated: 29 mL/min — ABNORMAL LOW (ref >60)
Glucose, Bld: 226 mg/dL — ABNORMAL HIGH (ref 70–99)
Potassium: 4.1 mmol/L (ref 3.5–5.1)
Sodium: 134 mmol/L — ABNORMAL LOW (ref 135–145)
Total Bilirubin: 0.7 mg/dL (ref 0.3–1.2)
Total Protein: 6.9 g/dL (ref 6.5–8.1)

## 2022-05-23 LAB — APTT: aPTT: 29 seconds (ref 24–36)

## 2022-05-23 LAB — MAGNESIUM: Magnesium: 2.2 mg/dL (ref 1.7–2.4)

## 2022-05-23 LAB — PROTIME-INR
INR: 1.3 — ABNORMAL HIGH (ref 0.8–1.2)
Prothrombin Time: 16.3 seconds — ABNORMAL HIGH (ref 11.4–15.2)

## 2022-05-23 LAB — GLUCOSE, CAPILLARY: Glucose-Capillary: 171 mg/dL — ABNORMAL HIGH (ref 70–99)

## 2022-05-23 LAB — TROPONIN I (HIGH SENSITIVITY)
Troponin I (High Sensitivity): 9 ng/L (ref <18)
Troponin I (High Sensitivity): 10 ng/L (ref <18)

## 2022-05-23 LAB — BRAIN NATRIURETIC PEPTIDE: B Natriuretic Peptide: 335.8 pg/mL — ABNORMAL HIGH (ref 0.0–100.0)

## 2022-05-23 LAB — MRSA NEXT GEN BY PCR, NASAL: MRSA by PCR Next Gen: NOT DETECTED

## 2022-05-23 MED ORDER — HYDRALAZINE HCL 20 MG/ML IJ SOLN
INTRAMUSCULAR | Status: AC
  Administered 2022-05-23: 20 mg via INTRAVENOUS
  Filled 2022-05-23: qty 1

## 2022-05-23 MED ORDER — HYDRALAZINE HCL 20 MG/ML IJ SOLN: 20.0000 mg | Freq: Once | INTRAMUSCULAR | Status: AC

## 2022-05-23 MED ORDER — LEVALBUTEROL HCL 1.25 MG/0.5ML IN NEBU
1.2500 mg | INHALATION_SOLUTION | Freq: Four times a day (QID) | RESPIRATORY_TRACT | Status: DC | PRN
  Administered 2022-05-23 (×3): 1.25 mg via RESPIRATORY_TRACT
  Filled 2022-05-23 (×3): qty 0.5

## 2022-05-23 MED ORDER — PREDNISONE 20 MG PO TABS
40.0000 mg | ORAL_TABLET | Freq: Every day | ORAL | Status: DC
  Administered 2022-05-23 – 2022-05-28 (×6): 40 mg via ORAL
  Filled 2022-05-23 (×6): qty 2

## 2022-05-23 MED ORDER — MUPIROCIN 2 % EX OINT: 1.0000 | TOPICAL_OINTMENT | Freq: Two times a day (BID) | CUTANEOUS | Status: DC

## 2022-05-23 MED ORDER — FUROSEMIDE 10 MG/ML IJ SOLN
40.0000 mg | Freq: Once | INTRAMUSCULAR | Status: AC
  Administered 2022-05-23: 40 mg via INTRAVENOUS

## 2022-05-23 MED ORDER — IPRATROPIUM BROMIDE 0.02 % IN SOLN
0.5000 mg | Freq: Four times a day (QID) | RESPIRATORY_TRACT | Status: DC
  Administered 2022-05-23 – 2022-05-27 (×16): 0.5 mg via RESPIRATORY_TRACT
  Filled 2022-05-23 (×16): qty 2.5

## 2022-05-23 MED ORDER — FUROSEMIDE 10 MG/ML IJ SOLN
40.0000 mg | Freq: Two times a day (BID) | INTRAMUSCULAR | Status: DC
  Administered 2022-05-23 – 2022-05-28 (×10): 40 mg via INTRAVENOUS
  Filled 2022-05-23 (×10): qty 4

## 2022-05-23 NOTE — Progress Notes (Signed)
Progress Note   Patient: Erin [name] ZOX:[id number] DOB: Dec 28, 1945 DOA: 05/21/2022     2 DOS: the patient was seen and examined on 05/23/2022   Brief hospital course: 77 y.o. female with medical history significant of nonischemic cardiomyopathy, VT s/p ICD, persistent atrial fibrillation on apixaban, CAD s/p stent on Clopidogrel, amiodarone induced thyrotoxicosis, and morbid obesity who presents to outside ED with dyspnea and acute hypoxic respiratory failure.    About 4 days ago had sore throat, post-nasal drip. However today woke up feeling flushed and could not breath. Better at rest but worse with exertion.Has some chest tightness at times but no pain. She has been feeling dyspnea on and off at least since February and has planned PFT later this month. Denies LE edema or calf. She did travel mid-April to [place], [place] with breaks in between the drive. No tobacco use history. Reports compliance with her Torsemide. She was taking it BID but reports months ago this was cut down to once daily. No change in her usual urine output  Assessment and Plan: Acute respiratory failure with hypoxia (HCC) -Presented hypoxic to 88% required 2L via [place] on presentation -wean as tolerated with goal of >94% on room air, currenlty still requiring 2-3LNC -Overnight events noted. Pt with acute hypoxemic event, requiring bipap support and additional lasix -Continue IV lasix as tolerated -ABG overnight reviewed. Findings suggesting respiratory acidosis with pCO2 of 51 and pH of 7.32 -Pt observed breathing with pursed lips and does appear to have a barrel shaped chest. Pt had noted improvement after receiving neb -Outpt documentation reviewed. No prior hx of COPD noted and no obvious risk factors. Pt is followed by Pulmonary and is planned to have PFT's in the near future. Agree with this, and had recommended pt to keep this appointment -For now, will give trial of scheduled and PRN nebs and steroids -Wean O2 as  toelrated -Cont bipap at night   Acute on chronic combined systolic and diastolic CHF (congestive heart failure) (HCC) -last echo 02/2022 with EF 35 to 40%. The left ventricle has moderately decreased function. The left ventricle demonstrates global hypokinesis. There is moderate left ventricular hypertrophy of the apical segment. Left ventricular diastolic parameters are consistent with Grade I diastolic dysfunction. -could be exacerbated by recent URI - Pt had been on 20mg  Torsemide and spirolactone PTA -continue spirolactone -hold Torsemide and continue IV Lasix 40mg  daily  -f/u on daily wts and I/o   Obesity, Class III, BMI 40-49.9 (morbid obesity) (HCC) -BMI of 42   Hypothyroidism -continue levothyroxine -has hx of amiodarone induced thyrotoxicosis and is followed by endocrinology outpatient   Persistent atrial fibrillation (HCC) -continue amiodarone, Eliquis, beta-blocker   CKD (chronic kidney disease) stage 3, GFR 30-59 ml/min (HCC) -CKD3b -Creatinine stable near baseline   Essential hypertension -continue diuretics, beta-blocker, Entresto   AICD (automatic cardioverter/defibrillator) present Hx of VT and NICM   Complex sleep apnea syndrome -Continue nighttime Bipap -follows with pulmonology outpatient   Subjective: Overnight events noted. Pt with acute hypoxemic respiratory distress overnight requiring additional lasix and bipap support. Currently still mildly sob  Physical Exam: Vitals:   05/23/22 1200 05/23/22 1215 05/23/22 1300 05/23/22 1330  BP: (!) 110/55  136/87 (!) 113/54  Pulse: 64  65 69  Resp: 20  17 (!) 24  Temp:  98.4 F (36.9 C)    TempSrc:  Oral    SpO2: 96%  97% 100%  Weight:      Height:  General exam: Conversant, in no acute distress Respiratory system: normal chest rise, clear, decreased BS, no audible wheezing Cardiovascular system: regular rhythm, s1-s2 Gastrointestinal system: Nondistended, nontender, pos BS Central nervous  system: No seizures, no tremors Extremities: No cyanosis, no joint deformities, improved LE edema Skin: No rashes, no pallor Psychiatry: Affect normal // no auditory hallucinations   Data Reviewed:  Labs reviewed: Na 134, K 4.1, Cr 1.78  Family Communication: Pt in room, family at bedside  Disposition: Status is: Inpatient Remains inpatient appropriate because: Severity of illness  Planned Discharge Destination: Home    Author: Rickey Barbara, MD 05/23/2022 2:37 PM  For on call review [email or web address].

## 2022-05-23 NOTE — Progress Notes (Addendum)
eLink Physician-Brief Progress Note Patient Name: MCCAULEY MERGEN DOB: 1945-11-18 MRN: [id number]   Date of Service  05/23/2022  HPI/Events of Note  Patient developed accelerated hypertension with acute pulmonary edema, resulting in hypoxemia, encephalopathy and impending respiratory failure, she had bilateral crackles per chest auscultation.  eICU Interventions  Lasix 40 mg iv  + Hydralazine 20 mg iv stat, patient placed on BIPAP, stat portable CXR and ABG ordered, following treatment blood pressure improved, and saturation normalized, patient became fully alert and oriented with a non-focal neurological examination, 12 lead EKG shows NSR, Troponin trended. Hospitalist arrived and took over patient's care.        Thomasene Lot Holten Spano 05/23/2022, 12:51 AM

## 2022-05-23 NOTE — Progress Notes (Signed)
    Patient Name: SHAKTHI JANUS           DOB: 1945/11/11  MRN: [id number]      Admission Date: 05/21/2022  Attending Provider: Jerald Kief, MD  Primary Diagnosis: Acute respiratory failure with hypoxia [facility])   Level of care: Stepdown    CROSS COVER NOTE   Date of Service   05/23/2022   [name], 77 y.o. female, was admitted on 05/21/2022 for Acute respiratory failure with hypoxia (HCC).    HPI/Events of Note   0045- charge ICU RN and Elink RN requesting bedside evaluation of patient.  Bedside RN reports patient was less responsive and hypoxic.   Nursing staff activated Elink button as they believed that patient was rapidly declining.  Elink PCCM provider placed orders for labs, ABG, EKG, chest x-ray, BiPAP, and Lasix. Once BiPAP was started, patient became more responsive and had optimal O2 saturation.    During bedside assessment,  Patient is awake, A/O x3, calm and cooperative.  Wearing Bipap and hemodynamically stable. Lungs: Crackles noted bilaterally, tachypneic, labored Heart: Heart sounds normal.  Regular rate and rhythm without murmur, gallop or rub, 1+ bilateral pedal edema Neuro: normal without focal findings, speech normal, active ROM, baseline motor and sensory response  ABG interpreted as respiratory acidosis, pH 7.32, pCO2 51, bicarb 26.3. Repeat gas in the morning if needed. BNP 451 --> 335.  Negative troponin   Interventions/ Plan   X        Anthoney Harada, DNP, ACNPC- AG [facility] [place]

## 2022-05-23 NOTE — Plan of Care (Signed)

## 2022-05-23 NOTE — Progress Notes (Signed)
RT note: Pt. seen prior in shift @1944  for scheduled aerosols, RN currently in room working with pt., aware of BiPAP order, aware to notify if RT needed to assist, RT to check on rounds.

## 2022-05-23 NOTE — Progress Notes (Signed)
   05/23/22 0302  BiPAP/CPAP/SIPAP  BiPAP/CPAP/SIPAP Pt Type Adult  BiPAP/CPAP/SIPAP V60  Mask Type Full face mask  Mask Size Medium  Set Rate 14 breaths/min  Respiratory Rate 22 breaths/min  IPAP 22 cmH20  EPAP 8 cmH2O  FiO2 (%) 30 %  Minute Ventilation 8.8  Peak Inspiratory Pressure (PIP) 22  Tidal Volume (Vt) 404  Patient Home Equipment No  Auto Titrate No  CPAP/SIPAP surface wiped down Yes  BiPAP/CPAP /SiPAP Vitals  Resp (!) 22  Bilateral Breath Sounds Expiratory wheezes  MEWS Score/Color  MEWS Score 1  MEWS Score Color Green

## 2022-05-23 NOTE — Progress Notes (Signed)
   05/23/22 0027  BiPAP/CPAP/SIPAP  BiPAP/CPAP/SIPAP Pt Type Adult  BiPAP/CPAP/SIPAP V60  Mask Type Full face mask  Mask Size Medium  Set Rate 14 breaths/min  Respiratory Rate 26 breaths/min  IPAP 18 cmH20  EPAP 8 cmH2O  FiO2 (%) 50 %  Minute Ventilation 11.9  Leak 12  Peak Inspiratory Pressure (PIP) 18  Tidal Volume (Vt) 459  Patient Home Equipment No  Auto Titrate No  BiPAP/CPAP /SiPAP Vitals  Pulse Rate 71  Resp (!) 26  BP (!) 204/85  SpO2 99 %  Bilateral Breath Sounds Coarse crackles  MEWS Score/Color  MEWS Score 4  MEWS Score Color Red

## 2022-05-23 NOTE — ED Provider Notes (Signed)
I was called upstairs from the [facility] emergency department for a CODE BLUE, need for emergent intubation.  Patient admitted to the hospital for CHF exacerbation.  Per report from nursing staff and telemetry critical care physician, patient suddenly became less responsive and hypoxic.  Was started on BiPAP.  Chest x-ray obtained upon my arrival revealing some pulmonary edema.  Patient also was exhibiting some severe hypertension and was given hydralazine as well as Lasix by the critical care physician.  All seems consistent with flash pulmonary edema.  On my exam patient seems to be improving, no longer hypoxic on the BiPAP at 40% FiO2.  She is more alert, she is able to answer questions and follow commands.  Based on her recovery and observing her for several minutes the intensivist and I agreed that she has a good chance of avoiding intubation.  Will continue BiPAP and medical management.  I will be available for any deterioration.  .Critical Care  Performed by: Sabas Sous, MD Authorized by: Sabas Sous, MD   Critical care provider statement:    Critical care time (minutes):  32   Critical care was necessary to treat or prevent imminent or life-threatening deterioration of the following conditions:  Respiratory failure   Critical care was time spent personally by me on the following activities:  Development of treatment plan with patient or surrogate, discussions with consultants, evaluation of patient's response to treatment, examination of patient, ordering and review of laboratory studies, ordering and review of radiographic studies, ordering and performing treatments and interventions, pulse oximetry, re-evaluation of patient's condition and review of old charts     Sabas Sous, MD 05/23/22 [phone]

## 2022-05-24 DIAGNOSIS — G4733 Obstructive sleep apnea (adult) (pediatric): Secondary | ICD-10-CM | POA: Diagnosis not present

## 2022-05-24 DIAGNOSIS — J9601 Acute respiratory failure with hypoxia: Secondary | ICD-10-CM | POA: Diagnosis not present

## 2022-05-24 DIAGNOSIS — R058 Other specified cough: Secondary | ICD-10-CM | POA: Diagnosis not present

## 2022-05-24 LAB — CBC
HCT: 39.8 % (ref 36.0–46.0)
Hemoglobin: 12.7 g/dL (ref 12.0–15.0)
MCH: 27.1 pg (ref 26.0–34.0)
MCHC: 31.9 g/dL (ref 30.0–36.0)
MCV: 85 fL (ref 80.0–100.0)
Platelets: 256 10*3/uL (ref 150–400)
RBC: 4.68 MIL/uL (ref 3.87–5.11)
RDW: 15.3 % (ref 11.5–15.5)
WBC: 7.9 10*3/uL (ref 4.0–10.5)
nRBC: 0 % (ref 0.0–0.2)

## 2022-05-24 LAB — COMPREHENSIVE METABOLIC PANEL
ALT: 44 U/L (ref 0–44)
AST: 41 U/L (ref 15–41)
Albumin: 3.5 g/dL (ref 3.5–5.0)
Alkaline Phosphatase: 62 U/L (ref 38–126)
Anion gap: 8 (ref 5–15)
BUN: 43 mg/dL — ABNORMAL HIGH (ref 8–23)
CO2: 26 mmol/L (ref 22–32)
Calcium: 8.7 mg/dL — ABNORMAL LOW (ref 8.9–10.3)
Chloride: 102 mmol/L (ref 98–111)
Creatinine, Ser: 1.6 mg/dL — ABNORMAL HIGH (ref 0.44–1.00)
GFR, Estimated: 33 mL/min — ABNORMAL LOW (ref >60)
Glucose, Bld: 143 mg/dL — ABNORMAL HIGH (ref 70–99)
Potassium: 4 mmol/L (ref 3.5–5.1)
Sodium: 136 mmol/L (ref 135–145)
Total Bilirubin: 0.8 mg/dL (ref 0.3–1.2)
Total Protein: 6.9 g/dL (ref 6.5–8.1)

## 2022-05-24 LAB — MAGNESIUM: Magnesium: 2.3 mg/dL (ref 1.7–2.4)

## 2022-05-24 MED ORDER — PROPRANOLOL HCL ER 60 MG PO CP24
60.0000 mg | ORAL_CAPSULE | Freq: Every day | ORAL | Status: DC
  Administered 2022-05-25 – 2022-05-28 (×4): 60 mg via ORAL
  Filled 2022-05-24 (×4): qty 1

## 2022-05-24 MED ORDER — ORAL CARE MOUTH RINSE
15.0000 mL | OROMUCOSAL | Status: DC
  Administered 2022-05-24 – 2022-05-27 (×6): 15 mL via OROMUCOSAL

## 2022-05-24 MED ORDER — CHLORHEXIDINE GLUCONATE CLOTH 2 % EX PADS
6.0000 | MEDICATED_PAD | Freq: Every day | CUTANEOUS | Status: DC
  Administered 2022-05-25 – 2022-05-26 (×2): 6 via TOPICAL

## 2022-05-24 MED ORDER — ORAL CARE MOUTH RINSE: 15.0000 mL | OROMUCOSAL | Status: DC | PRN

## 2022-05-24 MED ORDER — LEVALBUTEROL HCL 0.63 MG/3ML IN NEBU
0.6300 mg | INHALATION_SOLUTION | Freq: Four times a day (QID) | RESPIRATORY_TRACT | Status: DC
  Administered 2022-05-24 – 2022-05-27 (×12): 0.63 mg via RESPIRATORY_TRACT
  Filled 2022-05-24 (×12): qty 3

## 2022-05-24 MED ORDER — PROPRANOLOL HCL ER 60 MG PO CP24: 60.0000 mg | ORAL_CAPSULE | Freq: Every day | ORAL | Status: DC

## 2022-05-24 NOTE — Progress Notes (Signed)
RT note: Pt. tolerated V60 BiPAP very well this shift, family member remains @ bedside.

## 2022-05-24 NOTE — Progress Notes (Signed)
Progress Note   Patient: Erin [name] ZOX:[id number] DOB: Apr 04, 1945 DOA: 05/21/2022     3 DOS: the patient was seen and examined on 05/24/2022   Brief hospital course: 77 y.o. female with medical history significant of nonischemic cardiomyopathy, VT s/p ICD, persistent atrial fibrillation on apixaban, CAD s/p stent on Clopidogrel, amiodarone induced thyrotoxicosis, and morbid obesity who presents to outside ED with dyspnea and acute hypoxic respiratory failure.    About 4 days ago had sore throat, post-nasal drip. However today woke up feeling flushed and could not breath. Better at rest but worse with exertion.Has some chest tightness at times but no pain. She has been feeling dyspnea on and off at least since February and has planned PFT later this month. Denies LE edema or calf. She did travel mid-April to [place], [place] with breaks in between the drive. No tobacco use history. Reports compliance with her Torsemide. She was taking it BID but reports months ago this was cut down to once daily. No change in her usual urine output  Assessment and Plan: Acute respiratory failure with hypoxia (HCC) -Presented hypoxic to 88% required 2L via [place] on presentation -wean as tolerated with goal of >94% on room air, currently on 2L with O2 sats near 100% -Recently noted to have acute hypoxemic event, requiring bipap support and additional lasix -Pt observed breathing with pursed lips and does appear to have a barrel shaped chest. Pt had noted improvement after receiving neb -Outpt documentation reviewed. Pt is followed by Pulmonary and is planned to have PFT's in the near future. Recommended pt to keep this appointment -For now, will continue scheduled and PRN nebs and steroids -Cont bipap at night -Clinically improving   Acute on chronic combined systolic and diastolic CHF (congestive heart failure) (HCC) -last echo 02/2022 with EF 35 to 40%. The left ventricle has moderately decreased function. The  left ventricle demonstrates global hypokinesis. There is moderate left ventricular hypertrophy of the apical segment. Left ventricular diastolic parameters are consistent with Grade I diastolic dysfunction. -could be exacerbated by recent URI - Pt had been on 20mg  Torsemide and spirolactone PTA -continue spirolactone -Continue IV Lasix 40mg  BID -f/u on daily wts and I/o   Obesity, Class III, BMI 40-49.9 (morbid obesity) (HCC) -BMI of 42   Hypothyroidism -continue levothyroxine -has hx of amiodarone induced thyrotoxicosis and is followed by endocrinology outpatient   Persistent atrial fibrillation (HCC) -continue amiodarone, Eliquis, beta-blocker   CKD (chronic kidney disease) stage 3, GFR 30-59 ml/min (HCC) -CKD3b -Creatinine stable near baseline   Essential hypertension -BP soft while diuresing -Will hold entresto, place hold parameters on beta blocker -Cont diuretic   AICD (automatic cardioverter/defibrillator) present Hx of VT and NICM   Complex sleep apnea syndrome -Continue nighttime Bipap -follows with pulmonology outpatient   Subjective: Feeling somewhat better today  Physical Exam: Vitals:   05/24/22 1037 05/24/22 1100 05/24/22 1145 05/24/22 1200  BP:  110/61  (!) 100/41  Pulse: 69 64  63  Resp: 20 (!) 21  19  Temp:   98.1 F (36.7 C)   TempSrc:   Oral   SpO2: 97% 97%  100%  Weight:      Height:       General exam: Awake, laying in bed, in nad Respiratory system: Normal respiratory effort, no wheezing, decreased BS Cardiovascular system: regular rate, s1, s2 Gastrointestinal system: Soft, nondistended, positive BS Central nervous system: CN2-12 grossly intact, strength intact Extremities: Perfused, no clubbing Skin: Normal skin turgor, no  notable skin lesions seen Psychiatry: Mood normal // no visual hallucinations   Data Reviewed:  Labs reviewed: Na 136, K 4.0, Cr 1.60  Family Communication: Pt in room, family at bedside  Disposition: Status  is: Inpatient Remains inpatient appropriate because: Severity of illness  Planned Discharge Destination: Home    Author: Rickey Barbara, MD 05/24/2022 1:55 PM  For on call review [email or web address].

## 2022-05-25 DIAGNOSIS — R058 Other specified cough: Secondary | ICD-10-CM | POA: Diagnosis not present

## 2022-05-25 DIAGNOSIS — G4733 Obstructive sleep apnea (adult) (pediatric): Secondary | ICD-10-CM | POA: Diagnosis not present

## 2022-05-25 DIAGNOSIS — J9601 Acute respiratory failure with hypoxia: Secondary | ICD-10-CM | POA: Diagnosis not present

## 2022-05-25 LAB — COMPREHENSIVE METABOLIC PANEL
ALT: 38 U/L (ref 0–44)
AST: 30 U/L (ref 15–41)
Albumin: 3.3 g/dL — ABNORMAL LOW (ref 3.5–5.0)
Alkaline Phosphatase: 58 U/L (ref 38–126)
Anion gap: 9 (ref 5–15)
BUN: 39 mg/dL — ABNORMAL HIGH (ref 8–23)
CO2: 25 mmol/L (ref 22–32)
Calcium: 8.4 mg/dL — ABNORMAL LOW (ref 8.9–10.3)
Chloride: 101 mmol/L (ref 98–111)
Creatinine, Ser: 1.39 mg/dL — ABNORMAL HIGH (ref 0.44–1.00)
GFR, Estimated: 39 mL/min — ABNORMAL LOW (ref >60)
Glucose, Bld: 128 mg/dL — ABNORMAL HIGH (ref 70–99)
Potassium: 3.7 mmol/L (ref 3.5–5.1)
Sodium: 135 mmol/L (ref 135–145)
Total Bilirubin: 0.7 mg/dL (ref 0.3–1.2)
Total Protein: 6.7 g/dL (ref 6.5–8.1)

## 2022-05-25 LAB — MAGNESIUM: Magnesium: 2.4 mg/dL (ref 1.7–2.4)

## 2022-05-25 NOTE — Progress Notes (Signed)
   05/24/22 2333  BiPAP/CPAP/SIPAP  BiPAP/CPAP/SIPAP Pt Type Adult (V-60)  BiPAP/CPAP/SIPAP V60  Mask Type Full face mask  Mask Size Medium  Set Rate 14 breaths/min  Respiratory Rate 19 breaths/min  IPAP 16 cmH20  EPAP 8 cmH2O  FiO2 (%) 30 %  Minute Ventilation 8.5  Leak 00  Tidal Volume (Vt) 427  Patient Home Equipment No  Auto Titrate No

## 2022-05-25 NOTE — Progress Notes (Signed)
Patient called nurse in room, patient had very small amount of blood on tissue after blowing her nose. Educated that it could be from the continuous oxygen. Patient was on humidifed oxygen already. No active bleeding. Removed oxygen at this time. Currently 93% oxygen saturation.

## 2022-05-25 NOTE — Progress Notes (Signed)
Physical Therapy Treatment Patient Details Name: Erin [name] MRN: [id number] DOB: [birth year] Today's Date: 05/25/2022   History of Present Illness 77 y.o. female with medical history significant of nonischemic cardiomyopathy, VT s/p ICD, persistent atrial fibrillation on apixaban, CAD s/p stent on Clopidogrel, amiodarone induced thyrotoxicosis, and morbid obesity who presents to outside ED with dyspnea and acute hypoxic respiratory failure. Dx of acute on chronic CHF.    PT Comments     The patient did stand and pivot step to recliner,  will attempt to ambulate later , need +2 for safety. Patient plans to return home with family support.   Recommendations for follow up therapy are one component of a multi-disciplinary discharge planning process, led by the attending physician.  Recommendations may be updated based on patient status, additional functional criteria and insurance authorization.  Follow Up Recommendations       Assistance Recommended at Discharge Frequent or constant Supervision/Assistance  Patient can return home with the following A little help with walking and/or transfers;A little help with bathing/dressing/bathroom;Assistance with cooking/housework;Assist for transportation;Help with stairs or ramp for entrance   Equipment Recommendations  None recommended by PT    Recommendations for Other Services       Precautions / Restrictions Precautions Precautions: Fall Precaution Comments: monitor O2, denies falls in past 6 months Restrictions Weight Bearing Restrictions: No     Mobility  Bed Mobility Overal bed mobility: Needs Assistance Bed Mobility: Supine to Sit     Supine to sit: Min assist     General bed mobility comments: light support to opull to sit upright    Transfers Overall transfer level: Needs assistance Equipment used: 1 person hand held assist Transfers: Sit to/from Stand Sit to Stand: Min assist           General transfer  comment: increased time, min A to power up, used 1 armrest as recliner close to bed, Step to recliner,multiple lines  limiting    Ambulation/Gait                   Stairs             Wheelchair Mobility    Modified Rankin (Stroke Patients Only)       Balance Overall balance assessment: Needs assistance Sitting-balance support: Feet supported, No upper extremity supported Sitting balance-Leahy Scale: Good     Standing balance support: Single extremity supported Standing balance-Leahy Scale: Fair                              Cognition Arousal/Alertness: Awake/alert Behavior During Therapy: WFL for tasks assessed/performed Overall Cognitive Status: Within Functional Limits for tasks assessed                                 General Comments: putting a puzzle together        Exercises General Exercises - Lower Extremity Long Arc Quad: AROM, Both, 10 reps, Seated Hip ABduction/ADduction: AROM, Both, 10 reps, Seated    General Comments        Pertinent Vitals/Pain Pain Assessment Pain Assessment: No/denies pain    Home Living                          Prior Function            PT Goals (current goals can now  be found in the care plan section) Progress towards PT goals: Progressing toward goals    Frequency    Min 1X/week      PT Plan Current plan remains appropriate    Co-evaluation              AM-PAC PT "6 Clicks" Mobility   Outcome Measure  Help needed turning from your back to your side while in a flat bed without using bedrails?: A Little Help needed moving from lying on your back to sitting on the side of a flat bed without using bedrails?: A Little Help needed moving to and from a bed to a chair (including a wheelchair)?: A Little Help needed standing up from a chair using your arms (e.g., wheelchair or bedside chair)?: A Lot Help needed to walk in hospital room?: A Lot Help needed  climbing 3-5 steps with a railing? : A Lot 6 Click Score: 15    End of Session Equipment Utilized During Treatment: Oxygen Activity Tolerance: Patient tolerated treatment well Patient left: in chair;with call bell/phone within reach;with chair alarm set Nurse Communication: Mobility status PT Visit Diagnosis: Difficulty in walking, not elsewhere classified (R26.2)     Time: 1041-1100 PT Time Calculation (min) (ACUTE ONLY): 19 min  Charges:  $Therapeutic Activity: 8-22 mins                     Blanchard Kelch PT Acute Rehabilitation Services Office [phone] Weekend [phone]    Rada Hay 05/25/2022, 11:47 AM

## 2022-05-25 NOTE — Progress Notes (Signed)
Physical Therapy Treatment Patient Details Name: Erin [name] MRN: [id number] DOB: 1945/01/07 Today's Date: 05/25/2022   History of Present Illness 77 y.o. female with medical history significant of nonischemic cardiomyopathy, VT s/p ICD, persistent atrial fibrillation on apixaban, CAD s/p stent on Clopidogrel, amiodarone induced thyrotoxicosis, and morbid obesity who presents to outside ED with dyspnea and acute hypoxic respiratory failure. Dx of acute on chronic CHF.    PT Comments    The patient ambulated x 100' with RW on 2 L , SPO2 96%, HR 73  BP pre (sitting upright in recliner) 113/45, after ambulation 102/45. No dizziness reported.  Continue PT for mobility.  Recommendations for follow up therapy are one component of a multi-disciplinary discharge planning process, led by the attending physician.  Recommendations may be updated based on patient status, additional functional criteria and insurance authorization.  Follow Up Recommendations       Assistance Recommended at Discharge Frequent or constant Supervision/Assistance  Patient can return home with the following A little help with walking and/or transfers;A little help with bathing/dressing/bathroom;Assistance with cooking/housework;Assist for transportation;Help with stairs or ramp for entrance   Equipment Recommendations  None recommended by PT    Recommendations for Other Services       Precautions / Restrictions Precautions Precautions: Fall Precaution Comments: monitor O2, denies falls in past 6 months Restrictions Weight Bearing Restrictions: No     Mobility  Bed Mobility     General bed mobility comments: in recliner    Transfers Overall transfer level: Needs assistance Equipment used: Rolling walker (2 wheels) Transfers: Sit to/from Stand Sit to Stand: Min guard           General transfer comment: increased time, min A to power up, used 1 armrest as recliner close to bed, Step to  recliner,multiple lines  limiting    Ambulation/Gait Ambulation/Gait assistance: Min guard, +2 safety/equipment Gait Distance (Feet): 100 Feet Assistive device: Rolling walker (2 wheels) Gait Pattern/deviations: Step-through pattern, Decreased stride length       General Gait Details: Patient required RW for stability   Stairs             Wheelchair Mobility    Modified Rankin (Stroke Patients Only)       Balance Overall balance assessment: Needs assistance Sitting-balance support: Feet supported, No upper extremity supported Sitting balance-Leahy Scale: Good     Standing balance support: Bilateral upper extremity supported, During functional activity, Reliant on assistive device for balance Standing balance-Leahy Scale: Fair                              Cognition Arousal/Alertness: Awake/alert Behavior During Therapy: WFL for tasks assessed/performed Overall Cognitive Status: Within Functional Limits for tasks assessed                                 General Comments: putting a puzzle together        Exercises    General Comments        Pertinent Vitals/Pain Pain Assessment Pain Assessment: No/denies pain    Home Living                          Prior Function            PT Goals (current goals can now be found in the care plan section) Progress towards  PT goals: Progressing toward goals    Frequency    Min 1X/week      PT Plan Current plan remains appropriate    Co-evaluation              AM-PAC PT "6 Clicks" Mobility   Outcome Measure  Help needed turning from your back to your side while in a flat bed without using bedrails?: A Little Help needed moving from lying on your back to sitting on the side of a flat bed without using bedrails?: A Little Help needed moving to and from a bed to a chair (including a wheelchair)?: A Little Help needed standing up from a chair using your arms  (e.g., wheelchair or bedside chair)?: A Little Help needed to walk in hospital room?: A Little Help needed climbing 3-5 steps with a railing? : A Lot 6 Click Score: 17    End of Session Equipment Utilized During Treatment: Gait belt Activity Tolerance: Patient tolerated treatment well Patient left: in chair;with call bell/phone within reach;with chair alarm set Nurse Communication: Mobility status (BP) PT Visit Diagnosis: Difficulty in walking, not elsewhere classified (R26.2)     Time: 1610-9604 PT Time Calculation (min) (ACUTE ONLY): 22 min  Charges:  $Gait Training: 8-22 mins       Blanchard Kelch PT Acute Rehabilitation Services Office [phone] Weekend [phone]    Rada Hay 05/25/2022, 1:53 PM

## 2022-05-25 NOTE — Progress Notes (Signed)
Progress Note   Patient: Erin [name] [id number]:[id number] DOB: [birth year] DOA: 05/21/2022     4 DOS: the patient was seen and examined on 05/25/2022   Brief hospital course: 77 y.o. female with medical history significant of nonischemic cardiomyopathy, VT s/p ICD, persistent atrial fibrillation on apixaban, CAD s/p stent on Clopidogrel, amiodarone induced thyrotoxicosis, and morbid obesity who presents to outside ED with dyspnea and acute hypoxic respiratory failure.    About 4 days ago had sore throat, post-nasal drip. However today woke up feeling flushed and could not breath. Better at rest but worse with exertion.Has some chest tightness at times but no pain. She has been feeling dyspnea on and off at least since February and has planned PFT later this month. Denies LE edema or calf. She did travel mid-April to [place], [place] with breaks in between the drive. No tobacco use history. Reports compliance with her Torsemide. She was taking it BID but reports months ago this was cut down to once daily. No change in her usual urine output  Assessment and Plan: Acute respiratory failure with hypoxia (HCC) -Presented hypoxic to 88% required 2L via [place] on presentation -wean as tolerated with goal of >94% on room air, currently on 2L with O2 sats near 100% -Recently noted to have acute hypoxemic event, requiring bipap support and additional lasix -Agree with outpatient PFT's as already planned with Pulmonary. Cont on empiric neb tx and steroids for now -For now, will continue scheduled and PRN nebs and steroids -Cont bipap at night -Clinically improving   Acute on chronic combined systolic and diastolic CHF (congestive heart failure) (HCC) -last echo 02/2022 with EF 35 to 40%. The left ventricle has moderately decreased function. The left ventricle demonstrates global hypokinesis. There is moderate left ventricular hypertrophy of the apical segment. Left ventricular diastolic parameters are consistent  with Grade I diastolic dysfunction. -could be exacerbated by recent URI - Pt had been on 20mg  Torsemide and spirolactone PTA -continue spirolactone -Continue IV Lasix 40mg  BID -Very good diuresis thus far. Clinically improving   Obesity, Class III, BMI 40-49.9 (morbid obesity) (HCC) -BMI of 42 -Recommend diet/lifestyle modification   Hypothyroidism -continue levothyroxine -has hx of amiodarone induced thyrotoxicosis and is followed by endocrinology as an outpatient   Persistent atrial fibrillation (HCC) -continue amiodarone, Eliquis, beta-blocker   CKD (chronic kidney disease) stage 3, GFR 30-59 ml/min (HCC) -CKD3b -Creatinine stable near baseline   Essential hypertension -BP had been soft while diuresing -Currently holding entresto, continue hold parameters on beta blocker   AICD (automatic cardioverter/defibrillator) present Hx of VT and NICM   Complex sleep apnea syndrome -Continue nighttime Bipap -follows with pulmonology outpatient   Subjective: Reports breathing better today  Physical Exam: Vitals:   05/25/22 0814 05/25/22 0830 05/25/22 1000 05/25/22 1200  BP:   131/63 (!) 100/56  Pulse: 70 66 66 63  Resp: (!) 26 (!) 22 (!) 24 (!) 23  Temp:    98 F (36.7 C)  TempSrc:    Oral  SpO2: 99% 98% 96% 96%  Weight:      Height:       General exam: Conversant, in no acute distress Respiratory system: normal chest rise, clear, no audible wheezing Cardiovascular system: regular rhythm, s1-s2 Gastrointestinal system: Nondistended, nontender, pos BS Central nervous system: No seizures, no tremors Extremities: No cyanosis, no joint deformities Skin: No rashes, no pallor Psychiatry: Affect normal // no auditory hallucinations   Data Reviewed:  Labs reviewed: Na 135, K 3.7, Cr 1.39  Family Communication: Pt in room, family at bedside  Disposition: Status is: Inpatient Remains inpatient appropriate because: Severity of illness  Planned Discharge Destination:  Home    Author: Rickey Barbara, MD 05/25/2022 1:59 PM  For on call review [email or web address].

## 2022-05-26 DIAGNOSIS — R058 Other specified cough: Secondary | ICD-10-CM | POA: Diagnosis not present

## 2022-05-26 DIAGNOSIS — J9601 Acute respiratory failure with hypoxia: Secondary | ICD-10-CM | POA: Diagnosis not present

## 2022-05-26 DIAGNOSIS — G4733 Obstructive sleep apnea (adult) (pediatric): Secondary | ICD-10-CM | POA: Diagnosis not present

## 2022-05-26 LAB — COMPREHENSIVE METABOLIC PANEL
ALT: 46 U/L — ABNORMAL HIGH (ref 0–44)
AST: 38 U/L (ref 15–41)
Albumin: 3.3 g/dL — ABNORMAL LOW (ref 3.5–5.0)
Alkaline Phosphatase: 62 U/L (ref 38–126)
Anion gap: 9 (ref 5–15)
BUN: 45 mg/dL — ABNORMAL HIGH (ref 8–23)
CO2: 26 mmol/L (ref 22–32)
Calcium: 8.5 mg/dL — ABNORMAL LOW (ref 8.9–10.3)
Chloride: 99 mmol/L (ref 98–111)
Creatinine, Ser: 1.37 mg/dL — ABNORMAL HIGH (ref 0.44–1.00)
GFR, Estimated: 40 mL/min — ABNORMAL LOW (ref >60)
Glucose, Bld: 113 mg/dL — ABNORMAL HIGH (ref 70–99)
Potassium: 3.6 mmol/L (ref 3.5–5.1)
Sodium: 134 mmol/L — ABNORMAL LOW (ref 135–145)
Total Bilirubin: 0.8 mg/dL (ref 0.3–1.2)
Total Protein: 7 g/dL (ref 6.5–8.1)

## 2022-05-26 LAB — CBC
HCT: 40.9 % (ref 36.0–46.0)
Hemoglobin: 13.4 g/dL (ref 12.0–15.0)
MCH: 27.4 pg (ref 26.0–34.0)
MCHC: 32.8 g/dL (ref 30.0–36.0)
MCV: 83.6 fL (ref 80.0–100.0)
Platelets: 269 10*3/uL (ref 150–400)
RBC: 4.89 MIL/uL (ref 3.87–5.11)
RDW: 14.8 % (ref 11.5–15.5)
WBC: 11.3 10*3/uL — ABNORMAL HIGH (ref 4.0–10.5)
nRBC: 0 % (ref 0.0–0.2)

## 2022-05-26 LAB — MAGNESIUM: Magnesium: 2.3 mg/dL (ref 1.7–2.4)

## 2022-05-26 MED ORDER — HYDROCORTISONE 1 % EX CREA: TOPICAL_CREAM | Freq: Two times a day (BID) | CUTANEOUS | Status: DC

## 2022-05-26 MED ORDER — SALINE SPRAY 0.65 % NA SOLN
1.0000 | NASAL | Status: DC | PRN
  Filled 2022-05-26: qty 44

## 2022-05-26 MED ORDER — POTASSIUM CHLORIDE CRYS ER 20 MEQ PO TBCR
40.0000 meq | EXTENDED_RELEASE_TABLET | Freq: Once | ORAL | Status: AC
  Administered 2022-05-26: 40 meq via ORAL
  Filled 2022-05-26: qty 2

## 2022-05-26 MED ORDER — PHENYLEPHRINE-MINERAL OIL-PET 0.25-14-74.9 % RE OINT
1.0000 | TOPICAL_OINTMENT | Freq: Two times a day (BID) | RECTAL | Status: DC
  Administered 2022-05-26 – 2022-05-27 (×3): 1 via RECTAL
  Filled 2022-05-26: qty 57

## 2022-05-26 NOTE — Progress Notes (Signed)
Progress Note   Patient: Erin [name]:[id number] DOB: [birth year] DOA: 05/21/2022     5 DOS: the patient was seen and examined on 05/26/2022   Brief hospital course: 77 y.o. female with medical history significant of nonischemic cardiomyopathy, VT s/p ICD, persistent atrial fibrillation on apixaban, CAD s/p stent on Clopidogrel, amiodarone induced thyrotoxicosis, and morbid obesity who presents to outside ED with dyspnea and acute hypoxic respiratory failure.    About 4 days ago had sore throat, post-nasal drip. However today woke up feeling flushed and could not breath. Better at rest but worse with exertion.Has some chest tightness at times but no pain. She has been feeling dyspnea on and off at least since February and has planned PFT later this month. Denies LE edema or calf. She did travel mid-April to [place], [place] with breaks in between the drive. No tobacco use history. Reports compliance with her Torsemide. She was taking it BID but reports months ago this was cut down to once daily. No change in her usual urine output  Assessment and Plan: Acute respiratory failure with hypoxia (HCC) -Presented hypoxic to 88% required 2L via [place] on presentation -Recently noted to have acute hypoxemic event, requiring bipap support and additional lasix -Agree with outpatient PFT's as already planned with Pulmonary. Cont on empiric neb tx and steroids for now -For now, will continue scheduled and PRN nebs and steroids with IV lasix -Cont bipap at night -Clinically improving, weaned to RA -Still diuresing very well. Net neg over 6L thus far. Would continue   Acute on chronic combined systolic and diastolic CHF (congestive heart failure) (HCC) -last echo 02/2022 with EF 35 to 40%. The left ventricle has moderately decreased function. The left ventricle demonstrates global hypokinesis. There is moderate left ventricular hypertrophy of the apical segment. Left ventricular diastolic parameters are  consistent with Grade I diastolic dysfunction. -could be exacerbated by recent URI - Was on 20mg  Torsemide and spirolactone PTA -continue spirolactone -Continue IV Lasix 40mg  BID -Continues to diurese very well, improving -Recheck bmet in AM   Obesity, Class III, BMI 40-49.9 (morbid obesity) (HCC) -BMI of 42 -Recommend diet/lifestyle modification   Hypothyroidism -continue levothyroxine -has hx of amiodarone induced thyrotoxicosis and is followed by endocrinology as an outpatient   Persistent atrial fibrillation (HCC) -continue amiodarone, Eliquis, beta-blocker -Rate controlled   CKD (chronic kidney disease) stage 3, GFR 30-59 ml/min (HCC) -CKD3b -Creatinine stable near baseline   Essential hypertension -BP had been soft while diuresing -Currently holding entresto, continue hold parameters on beta blocker   AICD (automatic cardioverter/defibrillator) present Hx of VT and NICM   Complex sleep apnea syndrome -Continue nighttime Bipap -follows with pulmonology outpatient   Subjective: States feeling better today. Less short of breath  Physical Exam: Vitals:   05/26/22 0900 05/26/22 0957 05/26/22 1000 05/26/22 1100  BP:   136/73   Pulse: 72  83 70  Resp: (!) 22  20 20   Temp:      TempSrc:      SpO2: 94%  (!) 88% 92%  Weight:  94.6 kg    Height:       General exam: Awake, laying in bed, in nad Respiratory system: Normal respiratory effort, no wheezing Cardiovascular system: regular rate, s1, s2 Gastrointestinal system: Soft, nondistended, positive BS Central nervous system: CN2-12 grossly intact, strength intact Extremities: Perfused, no clubbing Skin: Normal skin turgor, no notable skin lesions seen Psychiatry: Mood normal // no visual hallucinations   Data Reviewed:  Labs reviewed: Na  134, K 3.6, Cr 1.37, WBC 11.3, Hgb 13.4  Family Communication: Pt in room, family at bedside  Disposition: Status is: Inpatient Remains inpatient appropriate because:  Severity of illness  Planned Discharge Destination: Home    Author: Rickey Barbara, MD 05/26/2022 1:01 PM  For on call review [email or web address].

## 2022-05-27 DIAGNOSIS — J9601 Acute respiratory failure with hypoxia: Secondary | ICD-10-CM | POA: Diagnosis not present

## 2022-05-27 LAB — CBC
HCT: 39.6 % (ref 36.0–46.0)
Hemoglobin: 12.8 g/dL (ref 12.0–15.0)
MCH: 27.2 pg (ref 26.0–34.0)
MCHC: 32.3 g/dL (ref 30.0–36.0)
MCV: 84.3 fL (ref 80.0–100.0)
Platelets: 264 10*3/uL (ref 150–400)
RBC: 4.7 MIL/uL (ref 3.87–5.11)
RDW: 14.7 % (ref 11.5–15.5)
WBC: 9.9 10*3/uL (ref 4.0–10.5)
nRBC: 0 % (ref 0.0–0.2)

## 2022-05-27 LAB — COMPREHENSIVE METABOLIC PANEL
ALT: 42 U/L (ref 0–44)
AST: 34 U/L (ref 15–41)
Albumin: 3.4 g/dL — ABNORMAL LOW (ref 3.5–5.0)
Alkaline Phosphatase: 58 U/L (ref 38–126)
Anion gap: 8 (ref 5–15)
BUN: 47 mg/dL — ABNORMAL HIGH (ref 8–23)
CO2: 27 mmol/L (ref 22–32)
Calcium: 8.3 mg/dL — ABNORMAL LOW (ref 8.9–10.3)
Chloride: 102 mmol/L (ref 98–111)
Creatinine, Ser: 1.23 mg/dL — ABNORMAL HIGH (ref 0.44–1.00)
GFR, Estimated: 45 mL/min — ABNORMAL LOW (ref >60)
Glucose, Bld: 104 mg/dL — ABNORMAL HIGH (ref 70–99)
Potassium: 4.2 mmol/L (ref 3.5–5.1)
Sodium: 137 mmol/L (ref 135–145)
Total Bilirubin: 0.9 mg/dL (ref 0.3–1.2)
Total Protein: 6.9 g/dL (ref 6.5–8.1)

## 2022-05-27 LAB — MAGNESIUM: Magnesium: 2.4 mg/dL (ref 1.7–2.4)

## 2022-05-27 MED ORDER — ORAL CARE MOUTH RINSE: 15.0000 mL | OROMUCOSAL | Status: DC | PRN

## 2022-05-27 MED ORDER — LEVALBUTEROL HCL 0.63 MG/3ML IN NEBU
0.6300 mg | INHALATION_SOLUTION | Freq: Three times a day (TID) | RESPIRATORY_TRACT | Status: DC
  Administered 2022-05-27: 0.63 mg via RESPIRATORY_TRACT
  Filled 2022-05-27: qty 3

## 2022-05-27 MED ORDER — LEVALBUTEROL HCL 0.63 MG/3ML IN NEBU
0.6300 mg | INHALATION_SOLUTION | Freq: Two times a day (BID) | RESPIRATORY_TRACT | Status: DC
  Administered 2022-05-27 – 2022-05-28 (×2): 0.63 mg via RESPIRATORY_TRACT
  Filled 2022-05-27 (×2): qty 3

## 2022-05-27 MED ORDER — IPRATROPIUM BROMIDE 0.02 % IN SOLN
0.5000 mg | Freq: Three times a day (TID) | RESPIRATORY_TRACT | Status: DC
  Administered 2022-05-27: 0.5 mg via RESPIRATORY_TRACT
  Filled 2022-05-27: qty 2.5

## 2022-05-27 MED ORDER — IPRATROPIUM BROMIDE 0.02 % IN SOLN
0.5000 mg | Freq: Two times a day (BID) | RESPIRATORY_TRACT | Status: DC
  Administered 2022-05-27 – 2022-05-28 (×2): 0.5 mg via RESPIRATORY_TRACT
  Filled 2022-05-27 (×2): qty 2.5

## 2022-05-27 NOTE — Progress Notes (Signed)
PROGRESS NOTE    Erin [name]  VHQ:[id number] DOB: [birth year] DOA: 05/21/2022  PCP: Cain Saupe, MD   Brief Narrative:  This  77 yrs old female with PMH significant of nonischemic cardiomyopathy, VT s/p ICD, persistent atrial fibrillation on apixaban, CAD s/p stent on Clopidogrel, amiodarone induced thyrotoxicosis, and morbid obesity who presents to outside ED with dyspnea and acute hypoxic respiratory failure.  She has developed sore throat, post-nasal drip about 4 days ago . However today woke up feeling flushed and could not breath. She feels better at rest but worse with exertion. She also reports chest tightness at times but no pain. She has intermittent shortness of breath on and off  since February and has planned PFT later this month. Denies LE edema or calf. She did travel mid-April to [place], [place] with breaks in between the drive. No tobacco use history. She reports compliance with her Torsemide. She was taking it BID but reports months ago this was cut down to once daily. No change in her usual urine output.She is admitted for further evaluation.  Assessment & Plan:  Principal Problem:   Acute respiratory failure with hypoxia (HCC) Active Problems:   Acute on chronic combined systolic and diastolic CHF (congestive heart failure) (HCC)   Complex sleep apnea syndrome   AICD (automatic cardioverter/defibrillator) present   Essential hypertension   CKD (chronic kidney disease) stage 3, GFR 30-59 ml/min (HCC)   Persistent atrial fibrillation (HCC)   Hypothyroidism   Obesity, Class III, BMI 40-49.9 (morbid obesity) (HCC)   Acute hypoxic respiratory failure: -She presented hypoxic , Spo2 88% required 2L via [place] on presentation. -She was recently noted to have acute hypoxemic event, requiring bipap support and additional lasix. -Agree with outpatient PFT's as already planned with Pulmonary.  -Cont on empiric nebulization tx and steroids for now -Continue IV Lasix  for  now. -Continue bipap at night and as needed. -Clinically improving, weaned to RA -Still diuresing very well. Net neg over 6L thus far. Would continue Lasix.   Intake/Output Summary (Last 24 hours) at 05/27/2022 1134 Last data filed at 05/27/2022 1030 Gross per 24 hour  Intake 340 ml  Output 2700 ml  Net -2360 ml    Acute on chronic combined systolic and diastolic CHF: -Her last echo on  02/2022 showed LVEF 35 to 40%. The left ventricle demonstrates global hypokinesis.Grade I diastolic dysfunction. - This could be exacerbated by recent URI. - She was on 20mg  Torsemide and spirolactone PTA. -Continue spirolactone 12.5 mg daily. -Continue IV Lasix 40mg  BID -Continues to diurese very well, improving -Recheck bmet in AM    Hypothyroidism -Continue levothyroxine. -has hx. of amiodarone induced thyrotoxicosis and is followed by endocrinology as an outpatient.   Persistent atrial fibrillation (HCC) -HR well-controlled -Continue amiodarone, Eliquis, beta-blocker  CKD (chronic kidney disease) stage 3, GFR 30-59 ml/min. -CKD3b -Creatinine stable near baseline.   Essential hypertension -BP had been soft while diuresing -Currently holding entresto, continue hold parameters on beta blocker.   AICD (automatic cardioverter/defibrillator) present Hx of VT and NICM.   Complex sleep apnea syndrome: -Continue nighttime Bipap -Follows with pulmonology outpatient.  Obesity, Class III, BMI 40-49.9 (morbid obesity) -BMI of 42 -Recommend diet/lifestyle modification  DVT prophylaxis: Eliquis Code Status: Full code Family Communication: No family at bed side Disposition Plan:    Status is: Inpatient Remains inpatient appropriate because: Admitted for acute hypoxic respiratory failure likely multifactorial.  Anticipated discharge home in 1 to 2 days.   Consultants:  None  Procedures: BIPAP   Antimicrobials:  Anti-infectives (From admission, onward)    None        Subjective: Patient was seen and examined at bedside.  Overnight events noted.   Patient reports  doing much better.  She remains on room air. She has not ambulated in the room. She reports feeling weak.  Objective: Vitals:   05/27/22 0610 05/27/22 0816 05/27/22 0828 05/27/22 1046  BP: 128/67   (!) 118/55  Pulse: 64   75  Resp: 18   18  Temp: 97.7 F (36.5 C)   98.2 F (36.8 C)  TempSrc: Oral   Oral  SpO2: 100% 96% 96% 97%  Weight:      Height:        Intake/Output Summary (Last 24 hours) at 05/27/2022 1127 Last data filed at 05/27/2022 1030 Gross per 24 hour  Intake 340 ml  Output 2700 ml  Net -2360 ml   Filed Weights   05/26/22 0500 05/26/22 0957 05/27/22 0500  Weight: 95.9 kg 94.6 kg 93.5 kg    Examination:  General exam: Appears calm and comfortable, deconditioned, not in any distress. Respiratory system: Clear to auscultation. Respiratory effort normal.  RR 16 Cardiovascular system: S1 & S2 heard, RRR. No JVD, murmurs, rubs, gallops or clicks. No pedal edema. Gastrointestinal system: Abdomen is soft, non tender, non distended, BS+ Central nervous system: Alert and oriented x 3 . No focal neurological deficits. Extremities: No edema, no cyanosis, no clubbing Skin: No rashes, lesions or ulcers Psychiatry: Judgement and insight appear normal. Mood & affect appropriate.     Data Reviewed: I have personally reviewed following labs and imaging studies  CBC: Recent Labs  Lab 05/21/22 1101 05/23/22 0044 05/24/22 0248 05/26/22 0303 05/27/22 0512  WBC 7.8 17.1* 7.9 11.3* 9.9  NEUTROABS  --  9.3*  --   --   --   HGB 13.8 14.4 12.7 13.4 12.8  HCT 43.2 46.1* 39.8 40.9 39.6  MCV 83.9 85.5 85.0 83.6 84.3  PLT 249 296 256 269 264   Basic Metabolic Panel: Recent Labs  Lab 05/22/22 0303 05/23/22 0044 05/24/22 0248 05/25/22 0302 05/26/22 0303 05/27/22 0512  NA 138 134* 136 135 134* 137  K 3.7 4.1 4.0 3.7 3.6 4.2  CL 103 99 102 101 99 102  CO2 25 25 26 25 26  27   GLUCOSE 139* 226* 143* 128* 113* 104*  BUN 27* 36* 43* 39* 45* 47*  CREATININE 1.38* 1.78* 1.60* 1.39* 1.37* 1.23*  CALCIUM 8.7* 8.7* 8.7* 8.4* 8.5* 8.3*  MG 2.3 2.2 2.3 2.4 2.3 2.4  PHOS 3.6  --   --   --   --   --    GFR: Estimated Creatinine Clearance: 38.3 mL/min (A) (by C-G formula based on SCr of 1.23 mg/dL (H)). Liver Function Tests: Recent Labs  Lab 05/23/22 0044 05/24/22 0248 05/25/22 0302 05/26/22 0303 05/27/22 0512  AST 51* 41 30 38 34  ALT 50* 44 38 46* 42  ALKPHOS 74 62 58 62 58  BILITOT 0.7 0.8 0.7 0.8 0.9  PROT 6.9 6.9 6.7 7.0 6.9  ALBUMIN 4.0 3.5 3.3* 3.3* 3.4*   No results for input(s): "LIPASE", "AMYLASE" in the last 168 hours. No results for input(s): "AMMONIA" in the last 168 hours. Coagulation Profile: Recent Labs  Lab 05/23/22 0044  INR 1.3*   Cardiac Enzymes: No results for input(s): "CKTOTAL", "CKMB", "CKMBINDEX", "TROPONINI" in the last 168 hours. BNP (last 3 results) No results for input(s): "PROBNP"  in the last 8760 hours. HbA1C: No results for input(s): "HGBA1C" in the last 72 hours. CBG: Recent Labs  Lab 05/23/22 0015  GLUCAP 171*   Lipid Profile: No results for input(s): "CHOL", "HDL", "LDLCALC", "TRIG", "CHOLHDL", "LDLDIRECT" in the last 72 hours. Thyroid Function Tests: No results for input(s): "TSH", "T4TOTAL", "FREET4", "T3FREE", "THYROIDAB" in the last 72 hours. Anemia Panel: No results for input(s): "VITAMINB12", "FOLATE", "FERRITIN", "TIBC", "IRON", "RETICCTPCT" in the last 72 hours. Sepsis Labs: No results for input(s): "PROCALCITON", "LATICACIDVEN" in the last 168 hours.  Recent Results (from the past 240 hour(s))  Resp panel by RT-PCR (RSV, Flu A&B, Covid) Anterior Nasal Swab     Status: None   Collection Time: 05/21/22 12:31 PM   Specimen: Anterior Nasal Swab  Result Value Ref Range Status   SARS Coronavirus 2 by RT PCR NEGATIVE NEGATIVE Final    Comment: (NOTE) SARS-CoV-2 target nucleic acids are NOT  DETECTED.  The SARS-CoV-2 RNA is generally detectable in upper respiratory specimens during the acute phase of infection. The lowest concentration of SARS-CoV-2 viral copies this assay can detect is 138 copies/mL. A negative result does not preclude SARS-Cov-2 infection and should not be used as the sole basis for treatment or other patient management decisions. A negative result may occur with  improper specimen collection/handling, submission of specimen other than nasopharyngeal swab, presence of viral mutation(s) within the areas targeted by this assay, and inadequate number of viral copies(<138 copies/mL). A negative result must be combined with clinical observations, patient history, and epidemiological information. The expected result is Negative.  Fact Sheet for Patients:  BloggerCourse.com  Fact Sheet for Healthcare Providers:  SeriousBroker.it  This test is no t yet approved or cleared by the [place] FDA and  has been authorized for detection and/or diagnosis of SARS-CoV-2 by FDA under an Emergency Use Authorization (EUA). This EUA will remain  in effect (meaning this test can be used) for the duration of the COVID-19 declaration under Section 564(b)(1) of the Act, 21 U.S.C.section 360bbb-3(b)(1), unless the authorization is terminated  or revoked sooner.       Influenza A by PCR NEGATIVE NEGATIVE Final   Influenza B by PCR NEGATIVE NEGATIVE Final    Comment: (NOTE) The Xpert Xpress SARS-CoV-2/FLU/RSV plus assay is intended as an aid in the diagnosis of influenza from Nasopharyngeal swab specimens and should not be used as a sole basis for treatment. Nasal washings and aspirates are unacceptable for Xpert Xpress SARS-CoV-2/FLU/RSV testing.  Fact Sheet for Patients: BloggerCourse.com  Fact Sheet for Healthcare Providers: SeriousBroker.it  This test is not yet  approved or cleared by the [place] FDA and has been authorized for detection and/or diagnosis of SARS-CoV-2 by FDA under an Emergency Use Authorization (EUA). This EUA will remain in effect (meaning this test can be used) for the duration of the COVID-19 declaration under Section 564(b)(1) of the Act, 21 U.S.C. section 360bbb-3(b)(1), unless the authorization is terminated or revoked.     Resp Syncytial Virus by PCR NEGATIVE NEGATIVE Final    Comment: (NOTE) Fact Sheet for Patients: BloggerCourse.com  Fact Sheet for Healthcare Providers: SeriousBroker.it  This test is not yet approved or cleared by the [place] FDA and has been authorized for detection and/or diagnosis of SARS-CoV-2 by FDA under an Emergency Use Authorization (EUA). This EUA will remain in effect (meaning this test can be used) for the duration of the COVID-19 declaration under Section 564(b)(1) of the Act, 21 U.S.C. section 360bbb-3(b)(1), unless the  authorization is terminated or revoked.  Performed at [facility], [address] [place], [place] [address]   MRSA Next Gen by PCR, Nasal     Status: None   Collection Time: 05/23/22 10:57 AM   Specimen: Nasal Mucosa; Nasal Swab  Result Value Ref Range Status   MRSA by PCR Next Gen NOT DETECTED NOT DETECTED Final    Comment: (NOTE) The GeneXpert MRSA Assay (FDA approved for NASAL specimens only), is one component of a comprehensive MRSA colonization surveillance program. It is not intended to diagnose MRSA infection nor to guide or monitor treatment for MRSA infections. Test performance is not FDA approved in patients less than 5 years old. Performed at [facility], [address], [place], [place] [address]    Radiology Studies: No results found.  Scheduled Meds:  amiodarone  100 mg Oral Daily   apixaban  5 mg Oral BID   atorvastatin  40 mg Oral Daily    clopidogrel  75 mg Oral Daily   ezetimibe  10 mg Oral Daily   fluticasone  1 spray Each Nare Daily   furosemide  40 mg Intravenous BID   ipratropium  0.5 mg Nebulization BID   levalbuterol  0.63 mg Nebulization BID   levothyroxine  50 mcg Oral Q0600   mouth rinse  15 mL Mouth Rinse 4 times per day   phenylephrine-shark liver oil-mineral oil-petrolatum  1 Application Rectal BID   predniSONE  40 mg Oral Q breakfast   propranolol ER  60 mg Oral Daily   spironolactone  12.5 mg Oral Daily   Continuous Infusions:   LOS: 6 days    Time spent: 50 mins    Willeen Niece, MD [facility]   If 7PM-7AM, please contact night-coverage

## 2022-05-27 NOTE — TOC Initial Note (Addendum)
Transition of Care [facility]) - Initial/Assessment Note    Patient Details  Name: Erin [name] MRN: [id number] Date of Birth: [birth year]  [facility] CM/SW Contact:    Lanier Clam, RN Phone Number: 05/27/2022, 1:06 PM  Clinical Narrative:PT recc HHPT patient w/no preference used [facility] in past will await rep Kandee Keen w/[facility] to confirm if can accept.has own transportation.  -4p-[facility] rep Morrie Sheldon accepted for HHPT.                  Expected Discharge Plan: Home w Home Health Services Barriers to Discharge: Continued Medical Work up   Patient Goals and CMS Choice Patient states their goals for this hospitalization and ongoing recovery are:: Home CMS Medicare.gov Compare Post Acute Care list provided to:: Patient Choice offered to / list presented to : Patient [place] ownership interest in [facility].provided to:: Patient    Expected Discharge Plan and Services In-house Referral: NA Discharge Planning Services: CM Consult Post Acute Care Choice: Home Health Living arrangements for the past 2 months: Apartment                 DME Arranged: N/A DME Agency: NA                  Prior Living Arrangements/Services Living arrangements for the past 2 months: Apartment Lives with:: Adult Children Patient language and need for interpreter reviewed:: Yes Do you feel safe going back to the place where you live?: Yes      Need for Family Participation in Patient Care: Yes (Comment) Care giver support system in place?: Yes (comment) Current home services: DME (rw) Criminal Activity/Legal Involvement Pertinent to Current Situation/Hospitalization: No - Comment as needed  Activities of Daily Living Home Assistive Devices/Equipment: CPAP, Walker (specify type), Dentures (specify type) ADL Screening (condition at time of admission) Patient's cognitive ability adequate to safely complete daily activities?: Yes Is the patient deaf or have difficulty  hearing?: No Does the patient have difficulty seeing, even when wearing glasses/contacts?: No Does the patient have difficulty concentrating, remembering, or making decisions?: No Patient able to express need for assistance with ADLs?: Yes Does the patient have difficulty dressing or bathing?: No Independently performs ADLs?: Yes (appropriate for developmental age) Does the patient have difficulty walking or climbing stairs?: Yes Weakness of Legs: None Weakness of Arms/Hands: None  Permission Sought/Granted Permission sought to share information with : Case Manager Permission granted to share information with : Yes, Verbal Permission Granted  Share Information with NAME: Case manager           Emotional Assessment Appearance:: Appears stated age Attitude/Demeanor/Rapport: Gracious Affect (typically observed): Accepting Orientation: : Oriented to Self, Oriented to Place, Oriented to  Time, Oriented to Situation Alcohol / Substance Use: Not Applicable Psych Involvement: No (comment)  Admission diagnosis:  Shortness of breath [R06.02] Wheezing [R06.2] OSA (obstructive sleep apnea) [G47.33] Pulmonary hypertension (HCC) [I27.20] Non-productive cough [R05.8] Acute respiratory failure with hypoxia (HCC) [J96.01] Patient Active Problem List   Diagnosis Date Noted   Acute respiratory failure with hypoxia (HCC) 05/21/2022   Hypothyroidism 05/21/2022   Obesity, Class III, BMI 40-49.9 (morbid obesity) (HCC) 05/21/2022   Persistent atrial fibrillation (HCC) 07/03/2021   PVCs (premature ventricular contractions) 07/03/2021   Hyperthyroidism 01/12/2019   High risk medication use 06/28/2018   Unstable angina (HCC)    Ventricular tachycardia (HCC) 08/20/2016   Ventricular fibrillation (HCC) 08/19/2016   CKD (chronic kidney disease) stage 3, GFR 30-59 ml/min (HCC) 08/19/2016  Chronic systolic heart failure (HCC) 08/30/2014   Atherosclerosis of native coronary artery of native heart with  stable angina pectoris (HCC)    Anemia 08/15/2014   Acute on chronic combined systolic and diastolic CHF (congestive heart failure) (HCC) 08/15/2014   H/O medication noncompliance    PAF (paroxysmal atrial fibrillation) (HCC)    Atrial fibrillation with RVR (HCC) 08/13/2014   Chronic systolic CHF (congestive heart failure) (HCC) 09/30/2012   Essential hypertension 08/24/2012   NICM (nonischemic cardiomyopathy) (HCC) 07/23/2012   AICD (automatic cardioverter/defibrillator) present 07/23/2012   Long term current use of anticoagulant therapy 07/23/2010   Complex sleep apnea syndrome 11/20/2007   CORONARY ATHEROSCLEROSIS NATIVE CORONARY ARTERY 11/20/2007   Pulmonary HTN (HCC) 11/20/2007   PCP:  Cain Saupe, MD Pharmacy:   [facility] [address], [place] - [address] [place] [address] Phone: [phone] Fax: [phone]  [facility]) [facility] - [place], [place] - [address] [place] [address] Phone: [phone] Fax: [phone]  [id number] [place], [place] - [address] [facility] [address] Phone: [phone] Fax: [phone]  [facility] - [place], [place] - [address] [place] [address] Phone: [phone] Fax: [phone]  [facility] [address], [place] - [address] [place] [address] Phone: [phone] Fax: [phone]  [id number] [place], [place] - [address] [place] [address] Phone: [phone] Fax: [phone]     Social Determinants of Health (SDOH) Social History: SDOH Screenings   Food Insecurity: No Food Insecurity (05/21/2022)  Housing: Low Risk  (05/21/2022)  Transportation Needs: No Transportation Needs (05/21/2022)  Utilities: Not At  Risk (05/21/2022)  Tobacco Use: Low Risk  (05/21/2022)   SDOH Interventions:     Readmission Risk Interventions    05/22/2022    6:23 PM  Readmission Risk Prevention Plan  Transportation Screening Complete  PCP or Specialist Appt within 5-7 Days Complete  Home Care Screening Complete  Medication Review (RN CM) Complete

## 2022-05-27 NOTE — Care Management Important Message (Signed)
Important Message  Patient Details IM Letter given. Name: DMIYA ANGLEY MRN: [id number] Date of Birth: 1945-01-14   Medicare Important Message Given:  Yes     Caren Macadam 05/27/2022, 11:22 AM

## 2022-05-28 DIAGNOSIS — J9601 Acute respiratory failure with hypoxia: Secondary | ICD-10-CM | POA: Diagnosis not present

## 2022-05-28 LAB — CBC
HCT: 41.1 % (ref 36.0–46.0)
Hemoglobin: 13.1 g/dL (ref 12.0–15.0)
MCH: 27.1 pg (ref 26.0–34.0)
MCHC: 31.9 g/dL (ref 30.0–36.0)
MCV: 85.1 fL (ref 80.0–100.0)
Platelets: 262 10*3/uL (ref 150–400)
RBC: 4.83 MIL/uL (ref 3.87–5.11)
RDW: 14.6 % (ref 11.5–15.5)
WBC: 11.1 10*3/uL — ABNORMAL HIGH (ref 4.0–10.5)
nRBC: 0 % (ref 0.0–0.2)

## 2022-05-28 LAB — BASIC METABOLIC PANEL
Anion gap: 10 (ref 5–15)
BUN: 44 mg/dL — ABNORMAL HIGH (ref 8–23)
CO2: 26 mmol/L (ref 22–32)
Calcium: 8.5 mg/dL — ABNORMAL LOW (ref 8.9–10.3)
Chloride: 100 mmol/L (ref 98–111)
Creatinine, Ser: 1.4 mg/dL — ABNORMAL HIGH (ref 0.44–1.00)
GFR, Estimated: 39 mL/min — ABNORMAL LOW (ref >60)
Glucose, Bld: 103 mg/dL — ABNORMAL HIGH (ref 70–99)
Potassium: 4.2 mmol/L (ref 3.5–5.1)
Sodium: 136 mmol/L (ref 135–145)

## 2022-05-28 LAB — PHOSPHORUS: Phosphorus: 4 mg/dL (ref 2.5–4.6)

## 2022-05-28 LAB — MAGNESIUM: Magnesium: 2.3 mg/dL (ref 1.7–2.4)

## 2022-05-28 MED ORDER — PREDNISONE 20 MG PO TABS: 40.0000 mg | ORAL_TABLET | Freq: Every day | ORAL | 0 refills | Status: AC

## 2022-05-28 NOTE — Discharge Summary (Signed)
Physician Discharge Summary  LURIA SONDERGAARD ZOX:[id number] DOB: [birth year] DOA: 05/21/2022  PCP: Cain Saupe, MD  Admit date: 05/21/2022  Discharge date: 05/28/2022  Admitted From: Home.  Disposition:  Home Health Services  Recommendations for Outpatient Follow-up:  Follow up with PCP in 1-2 weeks Please obtain BMP/CBC in one week Advised to continue current medications as prescribed.  Home Health:Home PT/OT Equipment/Devices:CPAP   Discharge Condition: Stable CODE STATUS:Full code Diet recommendation: Heart Healthy   Brief [facility] Course: This 77 yrs old female with PMH significant of nonischemic cardiomyopathy, VT s/p ICD, persistent atrial fibrillation on apixaban, CAD s/p stent on Clopidogrel, amiodarone induced thyrotoxicosis, and morbid obesity who presents to outside ED with dyspnea and acute hypoxic respiratory failure.  She has developed sore throat, post-nasal drip about 4 days ago . However today woke up feeling flushed and could not breath. She feels better at rest but worse with exertion. She also reports chest tightness at times but no pain. She has intermittent shortness of breath on and off  since February and has planned PFT later this month. Denies LE edema or calf. She did travel mid-April to [place], [place] with breaks in between the drive. No tobacco use history. She reports compliance with her Torsemide. She was taking it BID but reports months ago this was cut down to once daily. No change in her usual urine output. She was admitted for further evaluation.  Patient was continued on steroids, nebulized bronchodilators and was given IV Lasix as well.  She has made significant improvement and has lost around 7 L of fluid so far.  Patient feels much better and wants to be discharged.  Patient is being discharged home home health services arranged.  Discharge Diagnoses:  Principal Problem:   Acute respiratory failure with hypoxia (HCC) Active Problems:    Acute on chronic combined systolic and diastolic CHF (congestive heart failure) (HCC)   Complex sleep apnea syndrome   AICD (automatic cardioverter/defibrillator) present   Essential hypertension   CKD (chronic kidney disease) stage 3, GFR 30-59 ml/min (HCC)   Persistent atrial fibrillation (HCC)   Hypothyroidism   Obesity, Class III, BMI 40-49.9 (morbid obesity) (HCC)  Acute hypoxic respiratory failure: -She presented hypoxic , Spo2 88% required 2L via [place] on presentation. -She was recently noted to have acute hypoxemic event, requiring bipap support and additional lasix. -Agree with outpatient PFT's as already planned with Pulmonary.  -Cont on empiric nebulization tx and steroids for now -Continue IV Lasix  for now. -Continue bipap at night and as needed. -Clinically improving, weaned to RA -Still diuresing very well. Net neg over 6L thus far. Would continue Lasix.     Intake/Output Summary (Last 24 hours) at 05/27/2022 1134 Last data filed at 05/27/2022 1030    Gross per 24 hour  Intake 340 ml  Output 2700 ml  Net -2360 ml    Acute on chronic combined systolic and diastolic CHF: -Her last echo on  02/2022 showed LVEF 35 to 40%. The left ventricle demonstrates global hypokinesis.Grade I diastolic dysfunction. - This could be exacerbated by recent URI. - She was on 20mg  Torsemide and spirolactone PTA. -Continue spirolactone 12.5 mg daily. -Continue IV Lasix 40mg  BID -Continues to diurese very well, improving -    Hypothyroidism -Continue levothyroxine. -has hx. of amiodarone induced thyrotoxicosis and is followed by endocrinology as an outpatient.   Persistent atrial fibrillation (HCC) -HR well-controlled -Continue amiodarone, Eliquis, beta-blocker   CKD (chronic kidney disease) stage 3, GFR 30-59 ml/min. -  CKD3b -Creatinine stable near baseline.   Essential hypertension -BP had been soft while diuresing -Currently holding entresto, continue hold parameters on beta  blocker.   AICD (automatic cardioverter/defibrillator) present Hx of VT and NICM.   Complex sleep apnea syndrome: -Continue nighttime Bipap -Follows with pulmonology outpatient.   Obesity, Class III, BMI 40-49.9 (morbid obesity) -BMI of 42 -Recommend diet/lifestyle modification  Allergies as of 05/28/2022   No Known Allergies      Medication List     STOP taking these medications    Entresto 24-26 MG Generic drug: sacubitril-valsartan       TAKE these medications    amiodarone 200 MG tablet Commonly known as: PACERONE Take 0.5 tablets (100 mg total) by mouth daily.   apixaban 5 MG Tabs tablet Commonly known as: Eliquis Take 1 tablet (5 mg total) by mouth 2 (two) times daily.   atorvastatin 40 MG tablet Commonly known as: LIPITOR TAKE 1 TABLET BY MOUTH  DAILY   clopidogrel 75 MG tablet Commonly known as: PLAVIX TAKE 1 TABLET BY MOUTH DAILY   ezetimibe 10 MG tablet Commonly known as: ZETIA TAKE 1 TABLET BY MOUTH DAILY   fluticasone 50 MCG/ACT nasal spray Commonly known as: FLONASE Place 1 spray into both nostrils at bedtime.   Jardiance 10 MG Tabs tablet Generic drug: empagliflozin TAKE 1 TABLET BY MOUTH ONCE DAILY BEFORE BREAKFAST   levothyroxine 50 MCG tablet Commonly known as: SYNTHROID Take 1 tablet (50 mcg total) by mouth daily.   predniSONE 20 MG tablet Commonly known as: DELTASONE Take 2 tablets (40 mg total) by mouth daily with breakfast for 2 days. Start taking on: May 29, 2022   propranolol ER 160 MG SR capsule Commonly known as: INDERAL LA TAKE 1 CAPSULE BY MOUTH DAILY   spironolactone 25 MG tablet Commonly known as: ALDACTONE TAKE ONE-HALF TABLET BY MOUTH  DAILY   torsemide 20 MG tablet Commonly known as: DEMADEX TAKE 2 TABLETS BY MOUTH DAILY What changed: how much to take        Follow-up Information     [place], [facility] Follow up.   Why: HH physical therapy Contact information: [address] [place] [address] [phone]         Cain Saupe, MD Follow up in 1 week(s).   Specialty: Family Medicine Contact information: [address] [place] [address] [phone]                No Known Allergies  Consultations: None   Procedures/Studies: DG CHEST PORT 1 VIEW  Result Date: 05/23/2022 CLINICAL DATA:  Dyspnea EXAM: PORTABLE CHEST 1 VIEW COMPARISON:  05/21/2022 FINDINGS: Lungs are clear. No pneumothorax or pleural effusion. Stable cardiomegaly. Left subclavian dual lead pacemaker defibrillator is unchanged. Pulmonary vascularity is normal. No acute bone abnormality. IMPRESSION: 1. No active disease. Stable cardiomegaly. Electronically Signed   By: Helyn Numbers M.D.   On: 05/23/2022 00:55   DG Chest Port 1 View  Result Date: 05/21/2022 CLINICAL DATA:  Shortness of breath EXAM: PORTABLE CHEST 1 VIEW COMPARISON:  02/12/2022 FINDINGS: Dual lead left-sided implanted cardiac device remains in place. Stable cardiomegaly. Mildly prominent interstitial markings bilaterally. No focal airspace consolidation, pleural effusion, or pneumothorax. IMPRESSION: Cardiomegaly with mildly prominent interstitial markings, which may reflect mild interstitial edema. Electronically Signed   By: Duanne Guess D.O.   On: 05/21/2022 11:31   CUP PACEART REMOTE DEVICE CHECK  Result Date: 05/19/2022 Monthly battery check.  Estimated  2 months remaining longevity. Normal device function. No new alerts. HF diagnostics currently abnormal. Follow up as scheduled monthly.  Next scheduled 06/16/22. [facility], [facility]    Subjective: Patient was seen and examined at bedside.  Overnight events noted.   Patient report doing much better and wants to be discharged.  Patient is being discharged home.  Discharge Exam: Vitals:   05/28/22 0420 05/28/22 0752  BP: (!) 148/81   Pulse: 65   Resp: 20   Temp: 97.6 F (36.4 C)   SpO2: 100% 97%   Vitals:   05/27/22 2344 05/28/22  0420 05/28/22 0500 05/28/22 0752  BP:  (!) 148/81    Pulse: 62 65    Resp: (!) 22 20    Temp:  97.6 F (36.4 C)    TempSrc:  Oral    SpO2: 99% 100%  97%  Weight:   93.5 kg   Height:        General: Pt is alert, awake, not in acute distress Cardiovascular: RRR, S1/S2 +, no rubs, no gallops Respiratory: CTA bilaterally, no wheezing, no rhonchi Abdominal: Soft, NT, ND, bowel sounds + Extremities: no edema, no cyanosis    The results of significant diagnostics from this hospitalization (including imaging, microbiology, ancillary and laboratory) are listed below for reference.     Microbiology: Recent Results (from the past 240 hour(s))  Resp panel by RT-PCR (RSV, Flu A&B, Covid) Anterior Nasal Swab     Status: None   Collection Time: 05/21/22 12:31 PM   Specimen: Anterior Nasal Swab  Result Value Ref Range Status   SARS Coronavirus 2 by RT PCR NEGATIVE NEGATIVE Final    Comment: (NOTE) SARS-CoV-2 target nucleic acids are NOT DETECTED.  The SARS-CoV-2 RNA is generally detectable in upper respiratory specimens during the acute phase of infection. The lowest concentration of SARS-CoV-2 viral copies this assay can detect is 138 copies/mL. A negative result does not preclude SARS-Cov-2 infection and should not be used as the sole basis for treatment or other patient management decisions. A negative result may occur with  improper specimen collection/handling, submission of specimen other than nasopharyngeal swab, presence of viral mutation(s) within the areas targeted by this assay, and inadequate number of viral copies(<138 copies/mL). A negative result must be combined with clinical observations, patient history, and epidemiological information. The expected result is Negative.  Fact Sheet for Patients:  BloggerCourse.com  Fact Sheet for Healthcare Providers:  SeriousBroker.it  This test is no t yet approved or cleared by  the [place] FDA and  has been authorized for detection and/or diagnosis of SARS-CoV-2 by FDA under an Emergency Use Authorization (EUA). This EUA will remain  in effect (meaning this test can be used) for the duration of the COVID-19 declaration under Section 564(b)(1) of the Act, 21 U.S.C.section 360bbb-3(b)(1), unless the authorization is terminated  or revoked sooner.       Influenza A by PCR NEGATIVE NEGATIVE Final   Influenza B by PCR NEGATIVE NEGATIVE Final    Comment: (NOTE) The Xpert Xpress SARS-CoV-2/FLU/RSV plus assay is intended as an aid in the diagnosis of influenza from Nasopharyngeal swab specimens and should not be used as a sole basis for treatment. Nasal washings and aspirates are unacceptable for Xpert Xpress SARS-CoV-2/FLU/RSV testing.  Fact Sheet for Patients: BloggerCourse.com  Fact Sheet for Healthcare Providers: SeriousBroker.it  This test is not yet approved or cleared by the [place] FDA and has been authorized for detection and/or diagnosis of SARS-CoV-2 by FDA under an  Emergency Use Authorization (EUA). This EUA will remain in effect (meaning this test can be used) for the duration of the COVID-19 declaration under Section 564(b)(1) of the Act, 21 U.S.C. section 360bbb-3(b)(1), unless the authorization is terminated or revoked.     Resp Syncytial Virus by PCR NEGATIVE NEGATIVE Final    Comment: (NOTE) Fact Sheet for Patients: BloggerCourse.com  Fact Sheet for Healthcare Providers: SeriousBroker.it  This test is not yet approved or cleared by the [place] FDA and has been authorized for detection and/or diagnosis of SARS-CoV-2 by FDA under an Emergency Use Authorization (EUA). This EUA will remain in effect (meaning this test can be used) for the duration of the COVID-19 declaration under Section 564(b)(1) of the Act, 21  U.S.C. section 360bbb-3(b)(1), unless the authorization is terminated or revoked.  Performed at [facility], [address], [place], [place] [address]   MRSA Next Gen by PCR, Nasal     Status: None   Collection Time: 05/23/22 10:57 AM   Specimen: Nasal Mucosa; Nasal Swab  Result Value Ref Range Status   MRSA by PCR Next Gen NOT DETECTED NOT DETECTED Final    Comment: (NOTE) The GeneXpert MRSA Assay (FDA approved for NASAL specimens only), is one component of a comprehensive MRSA colonization surveillance program. It is not intended to diagnose MRSA infection nor to guide or monitor treatment for MRSA infections. Test performance is not FDA approved in patients less than 53 years old. Performed at [facility], [address] [place], [place] [address]      Labs: BNP (last 3 results) Recent Labs    05/21/22 1101 05/23/22 0044  BNP 451.2* 335.8*   Basic Metabolic Panel: Recent Labs  Lab 05/22/22 0303 05/23/22 0044 05/24/22 0248 05/25/22 0302 05/26/22 0303 05/27/22 0512 05/28/22 0530  NA 138   < > 136 135 134* 137 136  K 3.7   < > 4.0 3.7 3.6 4.2 4.2  CL 103   < > 102 101 99 102 100  CO2 25   < > 26 25 26 27 26   GLUCOSE 139*   < > 143* 128* 113* 104* 103*  BUN 27*   < > 43* 39* 45* 47* 44*  CREATININE 1.38*   < > 1.60* 1.39* 1.37* 1.23* 1.40*  CALCIUM 8.7*   < > 8.7* 8.4* 8.5* 8.3* 8.5*  MG 2.3   < > 2.3 2.4 2.3 2.4 2.3  PHOS 3.6  --   --   --   --   --  4.0   < > = values in this interval not displayed.   Liver Function Tests: Recent Labs  Lab 05/23/22 0044 05/24/22 0248 05/25/22 0302 05/26/22 0303 05/27/22 0512  AST 51* 41 30 38 34  ALT 50* 44 38 46* 42  ALKPHOS 74 62 58 62 58  BILITOT 0.7 0.8 0.7 0.8 0.9  PROT 6.9 6.9 6.7 7.0 6.9  ALBUMIN 4.0 3.5 3.3* 3.3* 3.4*   No results for input(s): "LIPASE", "AMYLASE" in the last 168 hours. No results for input(s): "AMMONIA" in the last 168 hours. CBC: Recent Labs   Lab 05/23/22 0044 05/24/22 0248 05/26/22 0303 05/27/22 0512 05/28/22 0530  WBC 17.1* 7.9 11.3* 9.9 11.1*  NEUTROABS 9.3*  --   --   --   --   HGB 14.4 12.7 13.4 12.8 13.1  HCT 46.1* 39.8 40.9 39.6 41.1  MCV 85.5 85.0 83.6 84.3 85.1  PLT 296 256 269 264 262  Cardiac Enzymes: No results for input(s): "CKTOTAL", "CKMB", "CKMBINDEX", "TROPONINI" in the last 168 hours. BNP: Invalid input(s): "POCBNP" CBG: Recent Labs  Lab 05/23/22 0015  GLUCAP 171*   D-Dimer No results for input(s): "DDIMER" in the last 72 hours. Hgb A1c No results for input(s): "HGBA1C" in the last 72 hours. Lipid Profile No results for input(s): "CHOL", "HDL", "LDLCALC", "TRIG", "CHOLHDL", "LDLDIRECT" in the last 72 hours. Thyroid function studies No results for input(s): "TSH", "T4TOTAL", "T3FREE", "THYROIDAB" in the last 72 hours.  Invalid input(s): "FREET3" Anemia work up No results for input(s): "VITAMINB12", "FOLATE", "FERRITIN", "TIBC", "IRON", "RETICCTPCT" in the last 72 hours. Urinalysis    Component Value Date/Time   COLORURINE AMBER (A) 08/14/2014 2130   APPEARANCEUR CLOUDY (A) 08/14/2014 2130   LABSPEC 1.015 08/14/2014 2130   PHURINE 5.0 08/14/2014 2130   GLUCOSEU NEGATIVE 08/14/2014 2130   HGBUR NEGATIVE 08/14/2014 2130   BILIRUBINUR NEGATIVE 08/14/2014 2130   KETONESUR TRACE (A) 08/14/2014 2130   PROTEINUR 100 (A) 08/14/2014 2130   UROBILINOGEN 0.2 01/06/2009 1957   NITRITE NEGATIVE 08/14/2014 2130   LEUKOCYTESUR TRACE (A) 08/14/2014 2130   Sepsis Labs Recent Labs  Lab 05/24/22 0248 05/26/22 0303 05/27/22 0512 05/28/22 0530  WBC 7.9 11.3* 9.9 11.1*   Microbiology Recent Results (from the past 240 hour(s))  Resp panel by RT-PCR (RSV, Flu A&B, Covid) Anterior Nasal Swab     Status: None   Collection Time: 05/21/22 12:31 PM   Specimen: Anterior Nasal Swab  Result Value Ref Range Status   SARS Coronavirus 2 by RT PCR NEGATIVE NEGATIVE Final    Comment: (NOTE) SARS-CoV-2  target nucleic acids are NOT DETECTED.  The SARS-CoV-2 RNA is generally detectable in upper respiratory specimens during the acute phase of infection. The lowest concentration of SARS-CoV-2 viral copies this assay can detect is 138 copies/mL. A negative result does not preclude SARS-Cov-2 infection and should not be used as the sole basis for treatment or other patient management decisions. A negative result may occur with  improper specimen collection/handling, submission of specimen other than nasopharyngeal swab, presence of viral mutation(s) within the areas targeted by this assay, and inadequate number of viral copies(<138 copies/mL). A negative result must be combined with clinical observations, patient history, and epidemiological information. The expected result is Negative.  Fact Sheet for Patients:  BloggerCourse.com  Fact Sheet for Healthcare Providers:  SeriousBroker.it  This test is no t yet approved or cleared by the [place] FDA and  has been authorized for detection and/or diagnosis of SARS-CoV-2 by FDA under an Emergency Use Authorization (EUA). This EUA will remain  in effect (meaning this test can be used) for the duration of the COVID-19 declaration under Section 564(b)(1) of the Act, 21 U.S.C.section 360bbb-3(b)(1), unless the authorization is terminated  or revoked sooner.       Influenza A by PCR NEGATIVE NEGATIVE Final   Influenza B by PCR NEGATIVE NEGATIVE Final    Comment: (NOTE) The Xpert Xpress SARS-CoV-2/FLU/RSV plus assay is intended as an aid in the diagnosis of influenza from Nasopharyngeal swab specimens and should not be used as a sole basis for treatment. Nasal washings and aspirates are unacceptable for Xpert Xpress SARS-CoV-2/FLU/RSV testing.  Fact Sheet for Patients: BloggerCourse.com  Fact Sheet for Healthcare  Providers: SeriousBroker.it  This test is not yet approved or cleared by the [place] FDA and has been authorized for detection and/or diagnosis of SARS-CoV-2 by FDA under an Emergency Use Authorization (EUA). This EUA will  remain in effect (meaning this test can be used) for the duration of the COVID-19 declaration under Section 564(b)(1) of the Act, 21 U.S.C. section 360bbb-3(b)(1), unless the authorization is terminated or revoked.     Resp Syncytial Virus by PCR NEGATIVE NEGATIVE Final    Comment: (NOTE) Fact Sheet for Patients: BloggerCourse.com  Fact Sheet for Healthcare Providers: SeriousBroker.it  This test is not yet approved or cleared by the [place] FDA and has been authorized for detection and/or diagnosis of SARS-CoV-2 by FDA under an Emergency Use Authorization (EUA). This EUA will remain in effect (meaning this test can be used) for the duration of the COVID-19 declaration under Section 564(b)(1) of the Act, 21 U.S.C. section 360bbb-3(b)(1), unless the authorization is terminated or revoked.  Performed at [facility], [address] [place], [place] [address]   MRSA Next Gen by PCR, Nasal     Status: None   Collection Time: 05/23/22 10:57 AM   Specimen: Nasal Mucosa; Nasal Swab  Result Value Ref Range Status   MRSA by PCR Next Gen NOT DETECTED NOT DETECTED Final    Comment: (NOTE) The GeneXpert MRSA Assay (FDA approved for NASAL specimens only), is one component of a comprehensive MRSA colonization surveillance program. It is not intended to diagnose MRSA infection nor to guide or monitor treatment for MRSA infections. Test performance is not FDA approved in patients less than 63 years old. Performed at [facility], [address] [place], [place] [address]      Time coordinating discharge: Over 30  minutes  SIGNED:   Willeen Niece, MD  [facility] 05/28/2022, 10:26 AM Pager   If 7PM-7AM, please contact night-coverage

## 2022-05-28 NOTE — Discharge Instructions (Signed)
Advised to follow-up with primary care physician in 1 week. Advised to continue current medications as prescribed. 

## 2022-06-01 ENCOUNTER — Other Ambulatory Visit: Payer: Self-pay | Admitting: Cardiovascular Disease

## 2022-06-02 ENCOUNTER — Encounter (HOSPITAL_BASED_OUTPATIENT_CLINIC_OR_DEPARTMENT_OTHER): Payer: Medicare Other

## 2022-06-02 ENCOUNTER — Other Ambulatory Visit: Payer: Self-pay

## 2022-06-02 ENCOUNTER — Ambulatory Visit (HOSPITAL_BASED_OUTPATIENT_CLINIC_OR_DEPARTMENT_OTHER): Payer: Medicare Other | Admitting: Pulmonary Disease

## 2022-06-02 NOTE — Telephone Encounter (Signed)
Good Morning,  Could you please schedule a 6 month follow up with Dr. Mariah Milling? The patient was last seen on 01-19-2022. Thank you so much.

## 2022-06-02 NOTE — Telephone Encounter (Signed)
Pt scheduled on 7/19

## 2022-06-08 ENCOUNTER — Telehealth: Payer: Self-pay | Admitting: Pulmonary Disease

## 2022-06-08 ENCOUNTER — Other Ambulatory Visit: Payer: Self-pay | Admitting: Cardiovascular Disease

## 2022-06-08 ENCOUNTER — Ambulatory Visit (INDEPENDENT_AMBULATORY_CARE_PROVIDER_SITE_OTHER): Payer: Medicare Other | Admitting: Pulmonary Disease

## 2022-06-08 ENCOUNTER — Telehealth: Payer: Self-pay | Admitting: Family Medicine

## 2022-06-08 DIAGNOSIS — R0609 Other forms of dyspnea: Secondary | ICD-10-CM

## 2022-06-08 LAB — PULMONARY FUNCTION TEST
DL/VA % pred: 69 %
DL/VA: 2.99 ml/min/mmHg/L
DLCO cor % pred: 37 %
DLCO cor: 5.74 ml/min/mmHg
DLCO unc % pred: 37 %
DLCO unc: 5.69 ml/min/mmHg
FEF 25-75 Post: 1.14 L/sec
FEF 25-75 Pre: 0.91 L/sec
FEF2575-%Change-Post: 25 %
FEF2575-%Pred-Post: 92 %
FEF2575-%Pred-Pre: 73 %
FEV1-%Change-Post: 3 %
FEV1-%Pred-Post: 69 %
FEV1-%Pred-Pre: 67 %
FEV1-Post: 1.05 L
FEV1-Pre: 1.01 L
FEV1FVC-%Change-Post: 0 %
FEV1FVC-%Pred-Pre: 107 %
FEV6-%Change-Post: 0 %
FEV6-%Pred-Post: 65 %
FEV6-%Pred-Pre: 65 %
FEV6-Post: 1.25 L
FEV6-Pre: 1.26 L
FEV6FVC-%Pred-Post: 106 %
FEV6FVC-%Pred-Pre: 106 %
FVC-%Change-Post: 2 %
FVC-%Pred-Post: 63 %
FVC-%Pred-Pre: 62 %
FVC-Post: 1.3 L
FVC-Pre: 1.27 L
Post FEV1/FVC ratio: 80 %
Post FEV6/FVC ratio: 100 %
Pre FEV1/FVC ratio: 80 %
Pre FEV6/FVC Ratio: 100 %
RV % pred: 79 %
RV: 1.61 L
TLC % pred: 74 %
TLC: 3.1 L

## 2022-06-08 NOTE — Telephone Encounter (Signed)
Pt wants to switch providers from dr. Craige Cotta, no reason given besides the fact she wants to see someone in June. She doesn't have a specific provider she wants to see. Please Advise

## 2022-06-08 NOTE — Telephone Encounter (Signed)
Dr. Craige Cotta can you please advise on patient wanting to switch providers? Do you have a held spot we can use.

## 2022-06-08 NOTE — Telephone Encounter (Signed)
Okay with me 

## 2022-06-08 NOTE — Progress Notes (Signed)
Full PFT completed today ? ?

## 2022-06-09 NOTE — Telephone Encounter (Signed)
Spoke with patient. I have her scheduled with Beth. NFN

## 2022-06-16 ENCOUNTER — Ambulatory Visit: Payer: Medicare Other

## 2022-06-16 DIAGNOSIS — I428 Other cardiomyopathies: Secondary | ICD-10-CM | POA: Diagnosis not present

## 2022-06-16 LAB — CUP PACEART REMOTE DEVICE CHECK
Battery Remaining Longevity: 1 mo
Battery Voltage: 2.78 V
Brady Statistic AP VP Percent: 0 %
Brady Statistic AP VS Percent: 0.16 %
Brady Statistic AS VP Percent: 0.04 %
Brady Statistic AS VS Percent: 99.8 %
Brady Statistic RA Percent Paced: 0.17 %
Brady Statistic RV Percent Paced: 0.04 %
Date Time Interrogation Session: 20240611043826
HighPow Impedance: 91 Ohm
Implantable Lead Connection Status: 753985
Implantable Lead Connection Status: 753985
Implantable Lead Implant Date: 20140718
Implantable Lead Implant Date: 20140718
Implantable Lead Location: 753859
Implantable Lead Location: 753860
Implantable Lead Model: 5076
Implantable Lead Model: 6935
Implantable Pulse Generator Implant Date: 20140718
Lead Channel Impedance Value: 323 Ohm
Lead Channel Impedance Value: 399 Ohm
Lead Channel Impedance Value: 399 Ohm
Lead Channel Pacing Threshold Amplitude: 0.625 V
Lead Channel Pacing Threshold Amplitude: 0.625 V
Lead Channel Pacing Threshold Pulse Width: 0.4 ms
Lead Channel Pacing Threshold Pulse Width: 0.4 ms
Lead Channel Sensing Intrinsic Amplitude: 14.625 mV
Lead Channel Sensing Intrinsic Amplitude: 14.625 mV
Lead Channel Sensing Intrinsic Amplitude: 3.75 mV
Lead Channel Sensing Intrinsic Amplitude: 3.75 mV
Lead Channel Setting Pacing Amplitude: 2 V
Lead Channel Setting Pacing Amplitude: 2.5 V
Lead Channel Setting Pacing Pulse Width: 0.4 ms
Lead Channel Setting Sensing Sensitivity: 0.3 mV
Zone Setting Status: 755011

## 2022-06-16 NOTE — Progress Notes (Signed)
Remote ICD transmission.   

## 2022-06-19 ENCOUNTER — Telehealth: Payer: Self-pay | Admitting: Internal Medicine

## 2022-06-19 NOTE — Telephone Encounter (Signed)
  1. Has your device fired?   2. Is you device beeping? Device went out thismorning, she thinks it is at the end of life, probably needs another one  3. Are you experiencing draining or swelling at device site? no  4. Are you calling to see if we received your device transmission? no  5. Have you passed out? no    Please route to Device Clinic Pool

## 2022-06-19 NOTE — Telephone Encounter (Signed)
Returned call to Pt.  Advised her alert received from her device was for Defib lead impedance of 101.    Will continue to monitor.  Pt is nearing ERI-1 month.  Advised if device alarms again to contact office.

## 2022-06-19 NOTE — Telephone Encounter (Signed)
Pt called back to advise her device had alarmed again.  Advised Pt to come in to device clinic for reprogramming.  Pt seen in clinic today.  RV defib impedance 81 ohms.  Reprogrammed device alert for RV defib impedance to 130 ohms.    Trends are WNL.

## 2022-06-25 ENCOUNTER — Other Ambulatory Visit: Payer: Self-pay | Admitting: Cardiovascular Disease

## 2022-07-11 ENCOUNTER — Other Ambulatory Visit: Payer: Self-pay | Admitting: Cardiovascular Disease

## 2022-07-15 NOTE — Progress Notes (Signed)
@Patient  ID: Erin [name], female    DOB: 02-26-1945, 77 y.o.   MRN: [id number]  Chief Complaint  Patient presents with   Follow-up    SOB with exertion.  Sx x several years    Referring provider: Cain Saupe, MD  HPI: 77 year old female, never smoked. Past medical history significant for complex sleep apnea, bind congestive heart failure, A-fib, upper airway cough syndrome, postnasal drip. Patient of Dr. Craige Cotta, last seen on 02/12/2022 for dyspnea on exertion.  07/16/2022 - Interim hx Patient presents today for overdue follow-up. She is doing well. No acute complaints today. Accompanied by her daughter. She was last seen in February for DOE, ordered for CXR and PFTs and recommended to return in 8 weeks. She was admitted in May for CHF and acute respiratory failure. Cough has improved since discharge. She gets winded with moderate exertion. She is not limited by her breathing, can do all activities. She takes amiodarone for afib and is on Torsemide 40mg  daily. She is monitoring her fluid intake. Weight has been stable.   Pulmonary function testing: PFTs 06/08/22>> FVC 1.30 (63%), FEV1 1.05 (69%), ratio 80, DLCO  Mild restriction with severe diffusion defect    No Known Allergies  Immunization History  Administered Date(s) Administered   Influenza,inj,Quad PF,6+ Mos 12/04/2016, 12/07/2017   Influenza,inj,quad, With Preservative 10/05/2017    Past Medical History:  Diagnosis Date   Anginal pain (HCC)    Automatic implantable cardioverter-defibrillator in situ    a. s/p MDT ICD 07/2012; b. SN # [id number]    CAD (coronary artery disease)    a. cath 2003: LM nl, mid LAD 50%, LCx nl, RCA nl b. L&RHC 08/17/2014 100% occluded mid LCx, 70% mid LAD with FFR 0.82. Medical therapy. CI 2.1. CO 4.01c. LHC 8/18: CTO LCx w/ R-L colatts, LAD s/p PCI/DES x 2   Chronic systolic CHF (congestive heart failure) (HCC)    a. echo 2014: EF 25%, diffuse HK, LA moderately dilated, RA mildly dilated, PASP  38 mm Hg; b. echo 08/2014: EF 25-30%, diffuse HK, RWMA cannot be excluded, mild MR, mild biatrial enlargement, RV mildly dilated, wall thickness nl, pacer wire/catheter noted, mild to mod TR, PASP high nl, c. TTE 8/18: EF 25-30%, diffuse HK, GR1DD, mod MR, mod dilated LA, nl RV sys fxn, PASP 56   Encounter for long-term (current) use of anticoagulants    Exertional shortness of breath    H/O medication noncompliance    High cholesterol    Hypertension    NICM (nonischemic cardiomyopathy) (HCC)    a. s/p AICD 07/22/2012   Obesity    OSA on CPAP    "suppose to; not often" (07/22/2012)   Other "heavy-for-dates" infants    PAF (paroxysmal atrial fibrillation) (HCC)    a. on warfarin--> Eliquis 08/2014    Tobacco History: Social History   Tobacco Use  Smoking Status Never  Smokeless Tobacco Never   Counseling given: Not Answered   Outpatient Medications Prior to Visit  Medication Sig Dispense Refill   amiodarone (PACERONE) 200 MG tablet Take 0.5 tablets (100 mg total) by mouth daily. 45 tablet 3   apixaban (ELIQUIS) 5 MG TABS tablet Take 1 tablet (5 mg total) by mouth 2 (two) times daily. 180 tablet 1   atorvastatin (LIPITOR) 40 MG tablet TAKE 1 TABLET BY MOUTH  DAILY 90 tablet 2   clopidogrel (PLAVIX) 75 MG tablet TAKE 1 TABLET BY MOUTH DAILY 90 tablet 0   ezetimibe (ZETIA) 10 MG  tablet TAKE 1 TABLET BY MOUTH DAILY 90 tablet 0   fluticasone (FLONASE) 50 MCG/ACT nasal spray Place 1 spray into both nostrils at bedtime. 16 g 2   JARDIANCE 10 MG TABS tablet TAKE 1 TABLET BY MOUTH ONCE DAILY BEFORE BREAKFAST 30 tablet 0   levothyroxine (SYNTHROID) 50 MCG tablet Take 1 tablet (50 mcg total) by mouth daily. 45 tablet 3   propranolol ER (INDERAL LA) 160 MG SR capsule TAKE 1 CAPSULE BY MOUTH DAILY 90 capsule 0   spironolactone (ALDACTONE) 25 MG tablet TAKE ONE-HALF TABLET BY MOUTH  DAILY 45 tablet 0   torsemide (DEMADEX) 20 MG tablet TAKE 2 TABLETS BY MOUTH DAILY (Patient taking differently:  Take 20 mg by mouth daily.) 180 tablet 0   No facility-administered medications prior to visit.    Review of Systems  Review of Systems  Constitutional: Negative.   HENT: Negative.    Respiratory:  Positive for wheezing. Negative for cough and shortness of breath.        DOE  Psychiatric/Behavioral: Negative.      Physical Exam  BP 118/60 (BP Location: Left Arm, Patient Position: Sitting, Cuff Size: Normal)   Pulse 69   Temp 98.4 F (36.9 C) (Oral)   Ht 5' (1.524 m)   Wt 204 lb 12.8 oz (92.9 kg)   SpO2 94%   BMI 40.00 kg/m  Physical Exam Constitutional:      Appearance: Normal appearance.  HENT:     Head: Normocephalic and atraumatic.  Cardiovascular:     Rate and Rhythm: Normal rate and regular rhythm.  Pulmonary:     Effort: Pulmonary effort is normal.     Breath sounds: Normal breath sounds.  Musculoskeletal:        General: Normal range of motion.     Comments: Ambulates with walker   Skin:    General: Skin is warm and dry.  Neurological:     General: No focal deficit present.     Mental Status: She is alert and oriented to person, place, and time. Mental status is at baseline.  Psychiatric:        Mood and Affect: Mood normal.        Behavior: Behavior normal.        Thought Content: Thought content normal.        Judgment: Judgment normal.      Lab Results:  CBC    Component Value Date/Time   WBC 11.1 (H) 05/28/2022 0530   RBC 4.83 05/28/2022 0530   HGB 13.1 05/28/2022 0530   HGB 13.2 07/03/2021 1607   HCT 41.1 05/28/2022 0530   HCT 39.7 07/03/2021 1607   PLT 262 05/28/2022 0530   PLT 315 07/03/2021 1607   MCV 85.1 05/28/2022 0530   MCV 82 07/03/2021 1607   MCH 27.1 05/28/2022 0530   MCHC 31.9 05/28/2022 0530   RDW 14.6 05/28/2022 0530   RDW 14.4 07/03/2021 1607   LYMPHSABS 5.1 (H) 05/23/2022 0044   LYMPHSABS 2.8 12/13/2018 1241   MONOABS 2.5 (H) 05/23/2022 0044   EOSABS 0.0 05/23/2022 0044   EOSABS 0.3 12/13/2018 1241   BASOSABS 0.1  05/23/2022 0044   BASOSABS 0.1 12/13/2018 1241    BMET    Component Value Date/Time   NA 136 05/28/2022 0530   NA 141 03/25/2022 1028   K 4.2 05/28/2022 0530   CL 100 05/28/2022 0530   CO2 26 05/28/2022 0530   GLUCOSE 103 (H) 05/28/2022 0530   BUN 44 (  H) 05/28/2022 0530   BUN 18 03/25/2022 1028   CREATININE 1.40 (H) 05/28/2022 0530   CALCIUM 8.5 (L) 05/28/2022 0530   GFRNONAA 39 (L) 05/28/2022 0530   GFRAA 53 (L) 12/06/2019 1112    BNP    Component Value Date/Time   BNP 335.8 (H) 05/23/2022 0044    ProBNP    Component Value Date/Time   PROBNP 559.0 (H) 05/02/2012 1633    Imaging: No results found.   Assessment & Plan:   Restrictive lung disease - Patient has symptoms of dyspnea with exertion. Pulmonary function testing showed mild restriction with diffusion defect. Patient reported benefit after using SABA during breathing test. Reviewed causes of restrictive lung disease with patient which can be obesity, scoliosis, interstitial lung disease, neuromuscular disease. Getting HRCT imaging due to abnormal PFTs and amiodarone use. I suspect most of her symptoms are related to underlying obesity and heart failure. She has no need for supplemental oxygen. Trial Levalbuterol 1-2 puffs every 6 hours for sob/wheezing.   Recommendations: - Continue fluid restriction and daily weights - Continue diuretics as prescribed by cardiology - Use Levalbuterol 1-2 puffs every 6 hours as needed for shortness of breath/wheezing   Orders: - HRCT chest re: DOE (ordered)   Follow-up: - Needs visit with Dr. Vassie Loll in [place] in 4-6 weeks (new patient OSA/dyspnea on exertion- 30 mins)      Glenford Bayley, NP 07/20/2022

## 2022-07-15 NOTE — Progress Notes (Signed)
Remote ICD transmission.   

## 2022-07-16 ENCOUNTER — Ambulatory Visit (INDEPENDENT_AMBULATORY_CARE_PROVIDER_SITE_OTHER): Payer: Medicare Other | Admitting: Primary Care

## 2022-07-16 ENCOUNTER — Encounter: Payer: Self-pay | Admitting: Primary Care

## 2022-07-16 VITALS — BP 118/60 | HR 69 | Temp 98.4°F | Ht 60.0 in | Wt 204.8 lb

## 2022-07-16 DIAGNOSIS — J984 Other disorders of lung: Secondary | ICD-10-CM

## 2022-07-16 DIAGNOSIS — R0609 Other forms of dyspnea: Secondary | ICD-10-CM

## 2022-07-16 MED ORDER — LEVALBUTEROL TARTRATE 45 MCG/ACT IN AERO: 1.0000 | INHALATION_SPRAY | Freq: Four times a day (QID) | RESPIRATORY_TRACT | 3 refills | Status: DC | PRN

## 2022-07-16 NOTE — Patient Instructions (Addendum)
-   You did well on walk test today, no need for supplemental oxygen  - Pulmonary function testing showed mild restriction with diffusion defect  - Causes of restrictive lung disease can be obesity, scoliosis, interstitial lung disease, neuromuscular disease   Recommendations: - Continue fluid restriction and daily weights - Continue diuretics as prescribed by cardiology - Use Levalbuterol 1-2 puffs every 6 hours as needed for shortness of breath/wheezing   Orders: - HRCT chest re: DOE (ordered)   Follow-up: - Needs visit with Erin [name] in [place] in 4-6 weeks (new patient OSA/dyspnea on exertion- 30 mins)

## 2022-07-20 DIAGNOSIS — J984 Other disorders of lung: Secondary | ICD-10-CM | POA: Insufficient documentation

## 2022-07-20 NOTE — Assessment & Plan Note (Addendum)
-   Patient has symptoms of dyspnea with exertion. Pulmonary function testing showed mild restriction with severe diffusion defect. Patient reported benefit after using SABA during breathing test. Reviewed causes of restrictive lung disease with patient which can be obesity, scoliosis, interstitial lung disease, neuromuscular disease. Getting HRCT imaging due to abnormal PFTs and amiodarone use. I suspect most of her symptoms are related to underlying obesity and heart failure. She has no need for supplemental oxygen. Trial Levalbuterol 1-2 puffs every 6 hours for sob/wheezing.   Recommendations: - Continue fluid restriction and daily weights - Continue diuretics as prescribed by cardiology - Use Levalbuterol 1-2 puffs every 6 hours as needed for shortness of breath/wheezing   Orders: - HRCT chest re: DOE (ordered)   Follow-up: - Needs visit with Dr. Vassie Loll in [place] in 4-6 weeks (new patient OSA/dyspnea on exertion- 30 mins)

## 2022-07-21 ENCOUNTER — Ambulatory Visit (INDEPENDENT_AMBULATORY_CARE_PROVIDER_SITE_OTHER): Payer: Medicare Other

## 2022-07-21 DIAGNOSIS — I428 Other cardiomyopathies: Secondary | ICD-10-CM

## 2022-07-22 ENCOUNTER — Other Ambulatory Visit: Payer: Self-pay | Admitting: Cardiovascular Disease

## 2022-07-22 LAB — CUP PACEART REMOTE DEVICE CHECK
Battery Remaining Longevity: 1 mo
Battery Voltage: 2.78 V
Brady Statistic AP VP Percent: 0 %
Brady Statistic AP VS Percent: 0.1 %
Brady Statistic AS VP Percent: 0.04 %
Brady Statistic AS VS Percent: 99.86 %
Brady Statistic RA Percent Paced: 0.1 %
Brady Statistic RV Percent Paced: 0.04 %
Date Time Interrogation Session: 20240716042507
HighPow Impedance: 79 Ohm
Implantable Lead Connection Status: 753985
Implantable Lead Connection Status: 753985
Implantable Lead Implant Date: 20140718
Implantable Lead Implant Date: 20140718
Implantable Lead Location: 753859
Implantable Lead Location: 753860
Implantable Lead Model: 5076
Implantable Lead Model: 6935
Implantable Pulse Generator Implant Date: 20140718
Lead Channel Impedance Value: 323 Ohm
Lead Channel Impedance Value: 399 Ohm
Lead Channel Impedance Value: 399 Ohm
Lead Channel Pacing Threshold Amplitude: 0.625 V
Lead Channel Pacing Threshold Amplitude: 0.625 V
Lead Channel Pacing Threshold Pulse Width: 0.4 ms
Lead Channel Pacing Threshold Pulse Width: 0.4 ms
Lead Channel Sensing Intrinsic Amplitude: 15 mV
Lead Channel Sensing Intrinsic Amplitude: 15 mV
Lead Channel Sensing Intrinsic Amplitude: 3.25 mV
Lead Channel Sensing Intrinsic Amplitude: 3.25 mV
Lead Channel Setting Pacing Amplitude: 2 V
Lead Channel Setting Pacing Amplitude: 2.5 V
Lead Channel Setting Pacing Pulse Width: 0.4 ms
Lead Channel Setting Sensing Sensitivity: 0.3 mV
Zone Setting Status: 755011

## 2022-07-24 ENCOUNTER — Ambulatory Visit: Payer: Medicare Other | Admitting: Physician Assistant

## 2022-07-24 ENCOUNTER — Encounter (HOSPITAL_BASED_OUTPATIENT_CLINIC_OR_DEPARTMENT_OTHER): Payer: Medicare Other

## 2022-07-24 ENCOUNTER — Ambulatory Visit (HOSPITAL_BASED_OUTPATIENT_CLINIC_OR_DEPARTMENT_OTHER): Payer: Medicare Other | Admitting: Pulmonary Disease

## 2022-07-30 ENCOUNTER — Ambulatory Visit (HOSPITAL_COMMUNITY)
Admission: RE | Admit: 2022-07-30 | Discharge: 2022-07-30 | Disposition: A | Discharge status: Home / Self Care | Payer: Medicare Other | Source: Ambulatory Visit | Attending: Primary Care | Admitting: Primary Care

## 2022-07-30 DIAGNOSIS — R0609 Other forms of dyspnea: Secondary | ICD-10-CM | POA: Insufficient documentation

## 2022-08-05 NOTE — Progress Notes (Unsigned)
Cardiology Office Note    Date:  08/06/2022   ID:  Erin [name], DOB [birth year], MRN [id number]  PCP:  Cain Saupe, MD  Cardiologist:  Julien Nordmann, MD  Electrophysiologist:  Sherryl Manges, MD   Chief Complaint: Follow up  History of Present Illness:   Erin [name] is a 77 y.o. 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   Prior cardiac cath in 2003 showed nonobstructive disease.  Echo in 2014 showed an EF of 25% at which time she underwent ICD implantation.  She was admitted in 2016 with acute on chronic CHF and A-fib with RVR.  She was transferred to [facility] with cardiac cath showing an occluded mid LCx, 70% mid LAD stenosis with an FFR of 0.82.  Echo at that time demonstrated an EF of 25% and enlargement of the left atrium.  Plans were for TEE guided DCCV, though TEE showed a left atrial thrombus with subsequent transition from Coumadin to apixaban.  Following this admission, she was lost to follow-up until she was admitted to the hospital in 08/2016 with an appropriate ICD shock outside the hospital for sustained VT.  She had ran out of medications leading up to presentation.  LHC during that admission showed significant two-vessel CAD with chronically occluded LCx with right-to-left collaterals along with significant progression of mid to proximal diffuse LAD disease.  She underwent successful PCI and 2-overlapping drug-eluting stents to the mid LAD extending into the proximal segment.  The proximal stent jailed a large first diagonal which had sluggish TIMI II flow.  Echo during that  admission showed an EF of 25 to 30% with diffuse hypokinesis, grade 1 diastolic dysfunction, moderate mitral regurgitation, moderately dilated left atrium, and an estimated PASP of 56 mmHg.  PYP scan in 2021 negative.  At her visit with EP in 06/2021, they recommended continuing amiodarone 200 mg daily.  Most recent echo from 08/2021 showed an improvement in her cardiomyopathy with an EF of 35 to 40%, global hypokinesis, grade 2 diastolic dysfunction, normal RV systolic function and ventricular cavity size, PASP 48.4 mmHg, mildly to moderately dilated left atrium, mild mitral regurgitation, mild to moderate tricuspid regurgitation, and an estimated right atrial pressure of 3 mmHg.  I last saw her in 01/2022 at which time she was without symptoms of angina or cardiac decompensation.  She noted improvement in chronic dyspnea when ambulating with a walker.  She was last seen by EP in 02/2022 with recommendation to continue amiodarone, albeit at lower dose 100 mg daily.   She was admitted to the hospital in 05/2022 with URI-like symptoms and acute hypoxic respiratory failure requiring supplemental oxygen via nasal cannula and symptomatic improvement with steroids, nebulizers, and IV diuresis.  High-sensitivity troponin negative x 4.  BNP 335.  She diuresed approximately 7 L.  At time of discharge, [name] was discontinued (in the context of relative hypotension) and torsemide was titrated to 40 mg daily.  PFTs in 06/2022 showed restrictive lung disease.  Most recent device interrogation from 07/22/2022 notable for battery approaching ERI.  EP is following.  She comes doing well from a  cardiac perspective and is without symptoms of angina or cardiac decompensation.  No lower extremity swelling, progressive orthopnea, abdominal distention, early satiety.  She remains on torsemide 40 mg daily and off Entresto.  Weights have been stable.  Her weight is down 7 to 8 pounds when compared to her last 2 visits in our office.   No falls or symptoms concerning for bleeding.  No device alarms or discharges.  Overall, she feels like she is doing well and does not have any acute cardiac concerns at this time.   Labs independently reviewed: 05/2022 - potassium 4.2, BUN 44, serum creatinine 1.4, magnesium 2.3, Hgb 13.1, PLT 262, albumin 3.4, AST/ALT normal, TSH normal 01/2022 - TC 249, TG 102, HDL 67, direct LDL 159  Past Medical History:  Diagnosis Date   Anginal pain (HCC)    Automatic implantable cardioverter-defibrillator in situ    a. s/p MDT ICD 07/2012; b. SN # [id number]    CAD (coronary artery disease)    a. cath 2003: LM nl, mid LAD 50%, LCx nl, RCA nl b. L&RHC 08/17/2014 100% occluded mid LCx, 70% mid LAD with FFR 0.82. Medical therapy. CI 2.1. CO 4.01c. LHC 8/18: CTO LCx w/ R-L colatts, LAD s/p PCI/DES x 2   Chronic systolic CHF (congestive heart failure) (HCC)    a. echo 2014: EF 25%, diffuse HK, LA moderately dilated, RA mildly dilated, PASP 38 mm Hg; b. echo 08/2014: EF 25-30%, diffuse HK, RWMA cannot be excluded, mild MR, mild biatrial enlargement, RV mildly dilated, wall thickness nl, pacer wire/catheter noted, mild to mod TR, PASP high nl, c. TTE 8/18: EF 25-30%, diffuse HK, GR1DD, mod MR, mod dilated LA, nl RV sys fxn, PASP 56   Encounter for long-term (current) use of anticoagulants    Exertional shortness of breath    H/O medication noncompliance    High cholesterol    Hypertension    NICM (nonischemic cardiomyopathy) (HCC)    a. s/p AICD 07/22/2012   Obesity    OSA on CPAP    "suppose to; not often" (07/22/2012)   Other "heavy-for-dates" infants    PAF (paroxysmal atrial fibrillation) (HCC)    a. on warfarin--> Eliquis 08/2014    Past Surgical History:  Procedure Laterality Date   ABDOMINAL HYSTERECTOMY  ~ 1998   "laparoscopic" (07/22/2012)   CARDIAC CATHETERIZATION  ? 1990's   CARDIAC CATHETERIZATION N/A 08/17/2014   Procedure: Right/Left Heart Cath and Coronary Angiography;  Surgeon:  Kathleene Hazel, MD;  Location: [facility] INVASIVE CV LAB;  Service: Cardiovascular;  Laterality: N/A;   CARDIAC DEFIBRILLATOR PLACEMENT  07/22/2012   dual-chamber ICD.   CARDIOVERSION N/A 01/13/2019   Procedure: CARDIOVERSION;  Surgeon: Antonieta Iba, MD;  Location: [facility] ORS;  Service: Cardiovascular;  Laterality: N/A;   CORONARY STENT INTERVENTION N/A 08/24/2016   Procedure: CORONARY STENT INTERVENTION;  Surgeon: Iran Ouch, MD;  Location: [facility] INVASIVE CV LAB;  Service: Cardiovascular;  Laterality: N/A;   IMPLANTABLE CARDIOVERTER DEFIBRILLATOR IMPLANT N/A 07/22/2012   Procedure: IMPLANTABLE CARDIOVERTER DEFIBRILLATOR IMPLANT;  Surgeon: Marinus Maw, MD;  Location: [facility] CATH LAB;  Service: Cardiovascular;  Laterality: N/A;   LEFT HEART CATH AND CORONARY ANGIOGRAPHY N/A 08/24/2016   Procedure: LEFT HEART CATH AND CORONARY ANGIOGRAPHY;  Surgeon: Iran Ouch, MD;  Location: [facility] INVASIVE CV LAB;  Service: Cardiovascular;  Laterality: N/A;   TEE WITHOUT CARDIOVERSION N/A 08/21/2014   Procedure: TRANSESOPHAGEAL ECHOCARDIOGRAM (TEE);  Surgeon: Chilton Si, MD;  Location: [facility] ENDOSCOPY;  Service:  Cardiovascular;  Laterality: N/A;   TUBAL LIGATION  ~ 1978    Current Medications: Current Meds  Medication Sig   amiodarone (PACERONE) 200 MG tablet Take 0.5 tablets (100 mg total) by mouth daily.   apixaban (ELIQUIS) 5 MG TABS tablet Take 1 tablet (5 mg total) by mouth 2 (two) times daily.   atorvastatin (LIPITOR) 40 MG tablet TAKE 1 TABLET BY MOUTH  DAILY   clopidogrel (PLAVIX) 75 MG tablet TAKE 1 TABLET BY MOUTH DAILY   ezetimibe (ZETIA) 10 MG tablet TAKE 1 TABLET BY MOUTH DAILY   fluticasone (FLONASE) 50 MCG/ACT nasal spray Place 1 spray into both nostrils at bedtime.   JARDIANCE 10 MG TABS tablet TAKE 1 TABLET BY MOUTH ONCE DAILY BEFORE BREAKFAST   levothyroxine (SYNTHROID) 50 MCG tablet Take 1 tablet (50 mcg total) by mouth daily.   propranolol ER (INDERAL LA) 160 MG SR capsule  TAKE 1 CAPSULE BY MOUTH DAILY   spironolactone (ALDACTONE) 25 MG tablet TAKE ONE-HALF TABLET BY MOUTH  DAILY   torsemide (DEMADEX) 20 MG tablet TAKE 2 TABLETS BY MOUTH DAILY (Patient taking differently: Take 20 mg by mouth daily.)    Allergies:   Patient has no known allergies.   Social History   Socioeconomic History   Marital status: Single    Spouse name: Not on file   Number of children: 3   Years of education: Not on file   Highest education level: Not on file  Occupational History   Occupation: Retired    Associate Professor: [employer]  Tobacco Use   Smoking status: Never   Smokeless tobacco: Never  Vaping Use   Vaping status: Never Used  Substance and Sexual Activity   Alcohol use: No   Drug use: No   Sexual activity: Never  Other Topics Concern   Not on file  Social History Narrative   Not on file   Social Determinants of Health   Financial Resource Strain: Not on file  Food Insecurity: No Food Insecurity (05/21/2022)   Hunger Vital Sign    Worried About Running Out of Food in the Last Year: Never true    Ran Out of Food in the Last Year: Never true  Transportation Needs: No Transportation Needs (05/21/2022)   PRAPARE - Administrator, Civil Service (Medical): No    Lack of Transportation (Non-Medical): No  Physical Activity: Not on file  Stress: Not on file  Social Connections: Not on file     Family History:  The patient's family history includes Diabetes in her sister; Heart disease in her father and mother; Heart failure in her brother; Lung disease in her sister. There is no history of Thyroid disease.  ROS:   12-point review of systems is negative unless otherwise noted in the HPI.   EKGs/Labs/Other Studies Reviewed:    Studies reviewed were summarized above. The additional studies were reviewed today:  Limited echo 02/13/2022: 1. Left ventricular ejection fraction, by estimation, is 35 to 40%. The  left ventricle has moderately decreased  function. The left ventricle  demonstrates global hypokinesis. There is moderate left ventricular  hypertrophy of the apical segment. Left  ventricular diastolic parameters are consistent with Grade I diastolic  dysfunction (impaired relaxation). The average left ventricular global  longitudinal strain is -12.7 %. The global longitudinal strain is  abnormal.   2. Right ventricular systolic function is normal. The right ventricular  size is normal. There is normal pulmonary artery systolic pressure.   3.  The mitral valve is normal in structure. Mild mitral valve  regurgitation. No evidence of mitral stenosis.   4. The aortic valve is normal in structure. Aortic valve regurgitation is  not visualized. No aortic stenosis is present.   5. The inferior vena cava is normal in size with greater than 50%  respiratory variability, suggesting right atrial pressure of 3 mmHg.  __________  2D echo 08/14/2021: 1. Left ventricular ejection fraction, by estimation, is 35 to 40%. Left  ventricular ejection fraction by 2D MOD biplane is 38.6 %. The left  ventricle has moderately decreased function. The left ventricle  demonstrates global hypokinesis. Left  ventricular diastolic parameters are consistent with Grade II diastolic  dysfunction (pseudonormalization). The average left ventricular global  longitudinal strain is -13.1 %. The global longitudinal strain is  abnormal.   2. Right ventricular systolic function is normal. The right ventricular  size is normal. There is moderately elevated pulmonary artery systolic  pressure. The estimated right ventricular systolic pressure is 48.4 mmHg.   3. Left atrial size was mild to moderately dilated.   4. The mitral valve is normal in structure. Mild mitral valve  regurgitation.   5. Tricuspid valve regurgitation is mild to moderate.   6. The aortic valve was not well visualized. Aortic valve regurgitation  is not visualized.   7. The inferior vena cava is  normal in size with greater than 50%  respiratory variability, suggesting right atrial pressure of 3 mmHg.  __________   2D echo 11/06/2016: - Left ventricle: The cavity size was mildly dilated. Wall    thickness was normal. Systolic function was severely reduced. The    estimated ejection fraction was in the range of 25% to 30%.    Features are consistent with a pseudonormal left ventricular    filling pattern, with concomitant abnormal relaxation and    increased filling pressure (grade 2 diastolic dysfunction).  - Mitral valve: There was moderate regurgitation.  - Left atrium: The atrium was mildly dilated.  - Tricuspid valve: There was mild regurgitation.  - Pulmonary arteries: Systolic pressure was severely increased. PA    peak pressure: 70 mm Hg (S).  __________   LHC 08/24/2016: Prox RCA lesion, 30 %stenosed. Post Atrio lesion, 25 %stenosed. Mid Cx lesion, 100 %stenosed. 1st Diag lesion, 40 %stenosed. A drug eluting stent was successfully placed. Prox LAD to Mid LAD lesion, 90 %stenosed. Post intervention, there is a 0% residual stenosis. LV end diastolic pressure is moderately elevated.   1. Significant 2 vessel coronary artery disease with chronically occluded left circumflex with right-to-left collaterals. Significant progression of mid to proximal diffuse LAD disease. 2. Moderately elevated left ventricular end-diastolic pressure. 3. Successful angioplasty and 2 overlapped drug-eluting stent placement to the mid LAD extending into the proximal segment. The proximal stent jailed a large first diagonal which had sluggish TIMI 2 flow. There was no significant ostial stenosis and most likely the sluggish flow was due to embolization.   Recommendations: the patient had mild chest tightness in the recovery area and EKG showed anterolateral ST depressions suggestive of global ischemia. I suspect that this is likely due to sluggish flow in the large diagonal branch. She was started  on nitroglycerin drip. She was given one dose of IV furosemide and Foley catheter was placed. The patient needs to be admitted to the ICU overnight.  She also has productive cough.  Consider treatment with antibiotics. She needs to be in dual antiplatelet therapy for at least one year.  __________   Eugenie Birks MPI 08/21/2016: Pharmacological myocardial perfusion imaging study with ISCHEMIA of moderate size, moderate perfusion defect in the mid to apical anterior wall Also with fixed defect in the basal to mid lateral wall. Normal wall motion, EF estimated at 24% No EKG changes concerning for ischemia at peak stress or in recovery. High risk scan Consider cardiac catheterization given known LAD disease by prior cardiac cath __________   2D echo 08/19/2016: - Left ventricle: The cavity size was mildly dilated. Systolic    function was severely reduced. The estimated ejection fraction    was in the range of 25% to 30%. Diffuse hypokinesis. Regional    wall motion abnormalities cannot be excluded. Doppler parameters    are consistent with abnormal left ventricular relaxation (grade 1    diastolic dysfunction).  - Mitral valve: There was moderate regurgitation.  - Left atrium: The atrium was moderately dilated.  - Right ventricle: Pacer wire or catheter noted in right ventricle.    Systolic function was normal.  - Right atrium: The atrium was mildly dilated.  - Tricuspid valve: There was mild-moderate regurgitation.  - Pulmonary arteries: Systolic pressure was moderately elevated. PA    peak pressure: 56 mm Hg (S).  __________   See CV studies in Epic for more remote studies   EKG:  EKG is ordered today.  The EKG ordered today demonstrates NSR, 68, low voltage QRS, nonspecific ST-T changes consistent with prior tracing  Recent Labs: 05/11/2022: TSH 3.91 05/23/2022: B Natriuretic Peptide 335.8 05/27/2022: ALT 42 05/28/2022: BUN 44; Creatinine, Ser 1.40; Hemoglobin 13.1; Magnesium 2.3;  Platelets 262; Potassium 4.2; Sodium 136  Recent Lipid Panel    Component Value Date/Time   CHOL 249 (H) 01/19/2022 1207   CHOL 154 05/02/2019 1218   TRIG 102 01/19/2022 1207   HDL 67 01/19/2022 1207   HDL 47 05/02/2019 1218   CHOLHDL 3.7 01/19/2022 1207   VLDL 20 01/19/2022 1207   LDLCALC 162 (H) 01/19/2022 1207   LDLCALC 87 05/02/2019 1218   LDLDIRECT 159 (H) 01/19/2022 1207    PHYSICAL EXAM:    VS:  BP (!) 120/58 (BP Location: Left Arm, Patient Position: Sitting, Cuff Size: Normal)   Pulse 68   Wt 202 lb 6.4 oz (91.8 kg)   SpO2 97%   BMI 39.53 kg/m   BMI: Body mass index is 39.53 kg/m.  Physical Exam Vitals reviewed.  Constitutional:      Appearance: She is well-developed.  HENT:     Head: Normocephalic and atraumatic.  Eyes:     General:        Right eye: No discharge.        Left eye: No discharge.  Neck:     Comments: JVD difficult to assess secondary to body habitus. Cardiovascular:     Rate and Rhythm: Normal rate and regular rhythm.     Pulses:          Dorsalis pedis pulses are 2+ on the right side and 2+ on the left side.       Posterior tibial pulses are 2+ on the right side and 2+ on the left side.     Heart sounds: Normal heart sounds, S1 normal and S2 normal. Heart sounds not distant. No midsystolic click and no opening snap. No murmur heard.    No friction rub.  Pulmonary:     Effort: Pulmonary effort is normal. No respiratory distress.     Breath sounds: Decreased breath sounds present. No  wheezing or rales.  Chest:     Chest wall: No tenderness.  Abdominal:     General: There is no distension.  Musculoskeletal:     Cervical back: Normal range of motion.     Right lower leg: No edema.     Left lower leg: No edema.  Skin:    General: Skin is warm and dry.     Nails: There is no clubbing.  Neurological:     Mental Status: She is alert and oriented to person, place, and time.  Psychiatric:        Speech: Speech normal.        Behavior:  Behavior normal.        Thought Content: Thought content normal.        Judgment: Judgment normal.     Wt Readings from Last 3 Encounters:  08/06/22 202 lb 6.4 oz (91.8 kg)  07/16/22 204 lb 12.8 oz (92.9 kg)  05/28/22 206 lb 2.1 oz (93.5 kg)     ASSESSMENT & PLAN:   CAD involving the native coronary arteries with stable angina: No symptoms suggestive of angina. She remains on clopidogrel, atorvastatin, ezetimibe, and propranolol. No indication for further ischemic testing at this time.   HFrEF secondary to ICM with history of sustained VT status post ICD/pulmonary hypertension: She appears euvolemic and well compensated.  She remains on propranolol (per EP because of its late acting sodium channel blocking properties, which has been associated with improved control of ventricular arrhythmia), spironolactone, Jardiance, and torsemide 40 mg daily.  Not currently on Entresto given relative hypotension noted during recent admission.  Check BMP.  Look to resume Entresto as able.  Remains on amiodarone at lower dose 100 mg per EP with normal LFT and TSH on most recent labs.  Device is approaching ERI, EP is monitoring.    PAF: Maintaining sinus rhythm with propranolol, on amiodarone as well as outlined above given sustained VT.  CHA2DS2-VASc at least 6 (CHF, HTN, age x 2, vascular disease, sex category).  She remains on apixaban 5 mg twice daily and does not meet reduced dosing criteria.  No symptoms concerning for bleeding.  No falls.  Recent renal function and Hgb stable.  HTN: Blood pressure is well controlled in the office today.  Currently on carvedilol and spironolactone.  HLD: LDL not at goal.  Remains on Lipitor 40 mg.  Diet has improved.  Look to repeat lipid panel at next visit with recommendation to escalate lipid-lowering therapy as indicated.   Amiodarone induced thyrotoxicosis/transaminitis: Followed by endocrinology and EP.  On beta-blocker as outlined above.  Management of  amiodarone per EP.   Chronic dyspnea with restrictive lung disease: Overall, dyspnea has improved.  Not currently decompensated from a cardiac perspective.  Query if this is related to body habitus/kyphosis as well.  Ongoing comanagement with pulmonology.  CKD stage IIIb: Stable.  Check BMP.    Disposition: F/u with Dr. Mariah Milling or an APP in 6 months, and EP as directed.    Medication Adjustments/Labs and Tests Ordered: Current medicines are reviewed at length with the patient today.  Concerns regarding medicines are outlined above. Medication changes, Labs and Tests ordered today are summarized above and listed in the Patient Instructions accessible in Encounters.   Signed, Eula Listen, PA-C 08/06/2022 12:44 PM     [facility] [address] [place], [place] [address] [phone]

## 2022-08-06 ENCOUNTER — Ambulatory Visit: Payer: Medicare Other | Attending: Physician Assistant | Admitting: Physician Assistant

## 2022-08-06 ENCOUNTER — Other Ambulatory Visit: Payer: Self-pay

## 2022-08-06 ENCOUNTER — Encounter: Payer: Self-pay | Admitting: Physician Assistant

## 2022-08-06 VITALS — BP 120/58 | HR 68 | Wt 202.4 lb

## 2022-08-06 DIAGNOSIS — I472 Ventricular tachycardia, unspecified: Secondary | ICD-10-CM

## 2022-08-06 DIAGNOSIS — I272 Pulmonary hypertension, unspecified: Secondary | ICD-10-CM

## 2022-08-06 DIAGNOSIS — J984 Other disorders of lung: Secondary | ICD-10-CM

## 2022-08-06 DIAGNOSIS — I480 Paroxysmal atrial fibrillation: Secondary | ICD-10-CM

## 2022-08-06 DIAGNOSIS — I255 Ischemic cardiomyopathy: Secondary | ICD-10-CM | POA: Diagnosis not present

## 2022-08-06 DIAGNOSIS — I10 Essential (primary) hypertension: Secondary | ICD-10-CM

## 2022-08-06 DIAGNOSIS — I25118 Atherosclerotic heart disease of native coronary artery with other forms of angina pectoris: Secondary | ICD-10-CM | POA: Diagnosis not present

## 2022-08-06 DIAGNOSIS — Z95810 Presence of automatic (implantable) cardiac defibrillator: Secondary | ICD-10-CM

## 2022-08-06 DIAGNOSIS — I5022 Chronic systolic (congestive) heart failure: Secondary | ICD-10-CM

## 2022-08-06 DIAGNOSIS — E785 Hyperlipidemia, unspecified: Secondary | ICD-10-CM

## 2022-08-06 DIAGNOSIS — Z79899 Other long term (current) drug therapy: Secondary | ICD-10-CM

## 2022-08-06 LAB — BASIC METABOLIC PANEL
BUN/Creatinine Ratio: 16 (ref 12–28)
BUN: 18 mg/dL (ref 8–27)
CO2: 26 mmol/L (ref 20–29)
Calcium: 9.3 mg/dL (ref 8.7–10.3)
Chloride: 100 mmol/L (ref 96–106)
Creatinine, Ser: 1.13 mg/dL — ABNORMAL HIGH (ref 0.57–1.00)
Glucose: 88 mg/dL (ref 70–99)
Potassium: 4.3 mmol/L (ref 3.5–5.2)
Sodium: 140 mmol/L (ref 134–144)
eGFR: 50 mL/min/{1.73_m2} — ABNORMAL LOW (ref >59)

## 2022-08-06 NOTE — Progress Notes (Signed)
Remote ICD transmission.   

## 2022-08-06 NOTE — Patient Instructions (Signed)
Medication Instructions:  Your physician recommends that you continue on your current medications as directed. Please refer to the Current Medication list given to you today.  *If you need a refill on your cardiac medications before your next appointment, please call your pharmacy*   Lab Work:  BMP  If you have labs (blood work) drawn today and your tests are completely normal, you will receive your results only by: MyChart Message (if you have MyChart) OR A paper copy in the mail If you have any lab test that is abnormal or we need to change your treatment, we will call you to review the results.   Testing/Procedures:  NONE   Follow-Up: At [facility], you and your health needs are our priority.  As part of our continuing mission to provide you with exceptional heart care, we have created designated Provider Care Teams.  These Care Teams include your primary Cardiologist (physician) and Advanced Practice Providers (APPs -  Physician Assistants and Nurse Practitioners) who all work together to provide you with the care you need, when you need it.  We recommend signing up for the patient portal called "MyChart".  Sign up information is provided on this After Visit Summary.  MyChart is used to connect with patients for Virtual Visits (Telemedicine).  Patients are able to view lab/test results, encounter notes, upcoming appointments, etc.  Non-urgent messages can be sent to your provider as well.   To learn more about what you can do with MyChart, go to [email or web address].    Your next appointment:   6 month(s)  Provider:   You may see Julien Nordmann, MD or one of the following Advanced Practice Providers on your designated Care Team:    Eula Listen, [place]-

## 2022-08-06 NOTE — Addendum Note (Signed)
Addended by: Elease Etienne A on: 08/06/2022 04:15 PM   Modules accepted: Level of Service

## 2022-08-07 ENCOUNTER — Other Ambulatory Visit: Payer: Self-pay | Admitting: *Deleted

## 2022-08-07 DIAGNOSIS — I25118 Atherosclerotic heart disease of native coronary artery with other forms of angina pectoris: Secondary | ICD-10-CM

## 2022-08-07 DIAGNOSIS — I5022 Chronic systolic (congestive) heart failure: Secondary | ICD-10-CM

## 2022-08-07 DIAGNOSIS — I255 Ischemic cardiomyopathy: Secondary | ICD-10-CM

## 2022-08-07 DIAGNOSIS — I480 Paroxysmal atrial fibrillation: Secondary | ICD-10-CM

## 2022-08-07 MED ORDER — SACUBITRIL-VALSARTAN 24-26 MG PO TABS: 1.0000 | ORAL_TABLET | Freq: Two times a day (BID) | ORAL | 11 refills | Status: DC

## 2022-08-18 ENCOUNTER — Ambulatory Visit (INDEPENDENT_AMBULATORY_CARE_PROVIDER_SITE_OTHER): Payer: Medicare Other

## 2022-08-18 DIAGNOSIS — I255 Ischemic cardiomyopathy: Secondary | ICD-10-CM

## 2022-08-18 DIAGNOSIS — I5022 Chronic systolic (congestive) heart failure: Secondary | ICD-10-CM

## 2022-08-18 LAB — CUP PACEART REMOTE DEVICE CHECK
Battery Remaining Longevity: 1 mo
Battery Voltage: 2.77 V
Brady Statistic AP VP Percent: 0 %
Brady Statistic AP VS Percent: 0.27 %
Brady Statistic AS VP Percent: 0.05 %
Brady Statistic AS VS Percent: 99.68 %
Brady Statistic RA Percent Paced: 0.27 %
Brady Statistic RV Percent Paced: 0.05 %
Date Time Interrogation Session: 20240813022605
HighPow Impedance: 75 Ohm
Implantable Lead Connection Status: 753985
Implantable Lead Connection Status: 753985
Implantable Lead Implant Date: 20140718
Implantable Lead Implant Date: 20140718
Implantable Lead Location: 753859
Implantable Lead Location: 753860
Implantable Lead Model: 5076
Implantable Lead Model: 6935
Implantable Pulse Generator Implant Date: 20140718
Lead Channel Impedance Value: 323 Ohm
Lead Channel Impedance Value: 399 Ohm
Lead Channel Impedance Value: 399 Ohm
Lead Channel Pacing Threshold Amplitude: 0.625 V
Lead Channel Pacing Threshold Amplitude: 0.625 V
Lead Channel Pacing Threshold Pulse Width: 0.4 ms
Lead Channel Pacing Threshold Pulse Width: 0.4 ms
Lead Channel Sensing Intrinsic Amplitude: 13 mV
Lead Channel Sensing Intrinsic Amplitude: 13 mV
Lead Channel Sensing Intrinsic Amplitude: 3.625 mV
Lead Channel Sensing Intrinsic Amplitude: 3.625 mV
Lead Channel Setting Pacing Amplitude: 2 V
Lead Channel Setting Pacing Amplitude: 2.5 V
Lead Channel Setting Pacing Pulse Width: 0.4 ms
Lead Channel Setting Sensing Sensitivity: 0.3 mV
Zone Setting Status: 755011

## 2022-08-19 ENCOUNTER — Other Ambulatory Visit
Admission: RE | Admit: 2022-08-19 | Discharge: 2022-08-19 | Disposition: A | Discharge status: Home / Self Care | Payer: Medicare Other | Attending: Physician Assistant | Admitting: Physician Assistant

## 2022-08-19 ENCOUNTER — Other Ambulatory Visit: Payer: Self-pay | Admitting: Cardiovascular Disease

## 2022-08-24 ENCOUNTER — Other Ambulatory Visit: Payer: Self-pay | Admitting: Cardiovascular Disease

## 2022-08-24 ENCOUNTER — Telehealth: Payer: Self-pay | Admitting: Cardiovascular Disease

## 2022-08-24 NOTE — Telephone Encounter (Signed)
Patient calling in regarding lab work. Please advise

## 2022-08-24 NOTE — Telephone Encounter (Signed)
Patient calling to make sure lab orders placed for patient's follow-up lab draw. Informed patient that the orders have been placed. Patient stated she would get them drawn at [facility] on [facility]

## 2022-08-25 ENCOUNTER — Ambulatory Visit: Payer: Medicare Other | Admitting: Internal Medicine

## 2022-08-25 ENCOUNTER — Encounter: Payer: Self-pay | Admitting: Internal Medicine

## 2022-08-25 VITALS — BP 112/62 | HR 69 | Ht 62.0 in | Wt 200.0 lb

## 2022-08-25 DIAGNOSIS — E038 Other specified hypothyroidism: Secondary | ICD-10-CM | POA: Diagnosis not present

## 2022-08-25 DIAGNOSIS — E0590 Thyrotoxicosis, unspecified without thyrotoxic crisis or storm: Secondary | ICD-10-CM | POA: Diagnosis not present

## 2022-08-25 LAB — T4, FREE: Free T4: 2.44 ng/dL — ABNORMAL HIGH (ref 0.60–1.60)

## 2022-08-25 LAB — TSH: TSH: 0.01 u[IU]/mL — ABNORMAL LOW (ref 0.35–5.50)

## 2022-08-25 NOTE — Patient Instructions (Signed)
Please stop at the lab.  Lease continue Levothyroxine 50 mcg daily.  Take the thyroid hormone every day, with water, at least 30 minutes before breakfast, separated by at least 4 hours from: - acid reflux medications - calcium - iron - multivitamins  You should have an endocrinology follow-up appointment in 6 months.

## 2022-08-25 NOTE — Progress Notes (Signed)
Patient ID: Erin [name], female   DOB: 22-Nov-1945, 77 y.o.   MRN: [id number]  HPI  Erin [name] is a 77 y.o.-year-old female, returning for follow-up for h/o amiodarone-induced thyrotoxicosis.  She previously saw Dr. Everardo All, but last visit with me 6 months ago.  Interim history:  No fatigue, weight gain, cold intolerance, constipation. Her Amiodarone dose was reduced to 50% 2 weeks prior to our last visit.  I reviewed patient's chart and also Dr. George Hugh last note: Patient was on amiodarone between 2014-2020.  This was restarted in 2022 at 200 mg daily.  She currently continues on this dose.  TFTs were fluctuating while on amiodarone, starting in 2015.  She was initially on methimazole, then came off and then restarted in 2018 and stopped in 2019.  She started levothyroxine in 2020 but stopped in 12/2018 and started back on methimazole in titration for cardioversion in 01/2019.  Methimazole was again stopped in 09/2019 due to hypothyroidism and restarted in 12/2019.  At last visit with Dr. Everardo All, in 03/2021, she was on methimazole 2.5 mg 3x a week >> this was stopped as a TSH was found to be 14.4.  Latest TSH obtained 3 months later was improved, but still abnormal, at 7.4.  She is on Inderal LA 160 mg daily.  Amiodarone was reduced to 100 mg daily in 02/2022.  We started levothyroxine 03/2022.  Pt is on levothyroxine 50 mcg daily, taken: - in am - fasting - at least 30 min from b'fast - no calcium - no iron - no multivitamins - no PPIs - not on Biotin  I reviewed pt's thyroid tests: Lab Results  Component Value Date   TSH 3.91 05/11/2022   TSH 9.190 (H) 03/25/2022   TSH 6.60 (H) 02/24/2022   TSH 6.84 (H) 10/21/2021   TSH 7.400 (H) 07/03/2021   TSH 14.43 (H) 03/12/2021   TSH 6.87 (H) 12/11/2020   TSH 4.390 09/10/2020   TSH 6.080 (H) 06/18/2020   TSH 4.46 11/09/2019   FREET4 1.05 05/11/2022   FREET4 0.85 02/24/2022   FREET4 0.95 10/21/2021   FREET4  0.81 03/12/2021   FREET4 0.85 12/11/2020   FREET4 1.21 09/10/2020   FREET4 0.91 11/09/2019   FREET4 0.27 (L) 09/26/2019   FREET4 0.62 07/28/2019   FREET4 0.85 05/29/2019   T3FREE 2.4 02/24/2022   T3FREE 2.7 10/21/2021   T3FREE 3.2 12/11/2020   T3FREE 1.8 (L) 09/10/2020   T3FREE 3.9 01/24/2019   T3FREE 3.9 01/10/2019   T3FREE 3.1 12/23/2018   T3FREE 3.1 08/20/2016   Antithyroid antibodies: Lab Results  Component Value Date   TSI 184 (H) 10/21/2021   Component     Latest Ref Rng 10/21/2021  Thyroperoxidase Ab SerPl-aCnc     <9 IU/mL 1   Thyroglobulin Ab     < or = 1 IU/mL <1   TRAB     <=2.00 IU/L 2.69 (H)    Pt denies: - feeling nodules in neck - hoarseness - dysphagia - choking  Pt does not have a FH of thyroid ds. No FH of thyroid cancer. No h/o radiation tx to head or neck. No steroid use. No herbal supplements. No Biotin use.  Pt. also has a history of CAD, NICM, sCHF, atrial fibrillation, HTN, history of medication noncompliance.  ROS: + see HPI  Past Medical History:  Diagnosis Date   Anginal pain (HCC)    Automatic implantable cardioverter-defibrillator in situ    a. s/p MDT ICD 07/2012; b. SN #  [id number]    CAD (coronary artery disease)    a. cath 2003: LM nl, mid LAD 50%, LCx nl, RCA nl b. L&RHC 08/17/2014 100% occluded mid LCx, 70% mid LAD with FFR 0.82. Medical therapy. CI 2.1. CO 4.01c. LHC 8/18: CTO LCx w/ R-L colatts, LAD s/p PCI/DES x 2   Chronic systolic CHF (congestive heart failure) (HCC)    a. echo 2014: EF 25%, diffuse HK, LA moderately dilated, RA mildly dilated, PASP 38 mm Hg; b. echo 08/2014: EF 25-30%, diffuse HK, RWMA cannot be excluded, mild MR, mild biatrial enlargement, RV mildly dilated, wall thickness nl, pacer wire/catheter noted, mild to mod TR, PASP high nl, c. TTE 8/18: EF 25-30%, diffuse HK, GR1DD, mod MR, mod dilated LA, nl RV sys fxn, PASP 56   Encounter for long-term (current) use of anticoagulants    Exertional shortness of  breath    H/O medication noncompliance    High cholesterol    Hypertension    NICM (nonischemic cardiomyopathy) (HCC)    a. s/p AICD 07/22/2012   Obesity    OSA on CPAP    "suppose to; not often" (07/22/2012)   Other "heavy-for-dates" infants    PAF (paroxysmal atrial fibrillation) (HCC)    a. on warfarin--> Eliquis 08/2014   Past Surgical History:  Procedure Laterality Date   ABDOMINAL HYSTERECTOMY  ~ 1998   "laparoscopic" (07/22/2012)   CARDIAC CATHETERIZATION  ? 1990's   CARDIAC CATHETERIZATION N/A 08/17/2014   Procedure: Right/Left Heart Cath and Coronary Angiography;  Surgeon: Kathleene Hazel, MD;  Location: [facility] INVASIVE CV LAB;  Service: Cardiovascular;  Laterality: N/A;   CARDIAC DEFIBRILLATOR PLACEMENT  07/22/2012   dual-chamber ICD.   CARDIOVERSION N/A 01/13/2019   Procedure: CARDIOVERSION;  Surgeon: Antonieta Iba, MD;  Location: [facility] ORS;  Service: Cardiovascular;  Laterality: N/A;   CORONARY STENT INTERVENTION N/A 08/24/2016   Procedure: CORONARY STENT INTERVENTION;  Surgeon: Iran Ouch, MD;  Location: [facility] INVASIVE CV LAB;  Service: Cardiovascular;  Laterality: N/A;   IMPLANTABLE CARDIOVERTER DEFIBRILLATOR IMPLANT N/A 07/22/2012   Procedure: IMPLANTABLE CARDIOVERTER DEFIBRILLATOR IMPLANT;  Surgeon: Marinus Maw, MD;  Location: [facility] CATH LAB;  Service: Cardiovascular;  Laterality: N/A;   LEFT HEART CATH AND CORONARY ANGIOGRAPHY N/A 08/24/2016   Procedure: LEFT HEART CATH AND CORONARY ANGIOGRAPHY;  Surgeon: Iran Ouch, MD;  Location: [facility] INVASIVE CV LAB;  Service: Cardiovascular;  Laterality: N/A;   TEE WITHOUT CARDIOVERSION N/A 08/21/2014   Procedure: TRANSESOPHAGEAL ECHOCARDIOGRAM (TEE);  Surgeon: Chilton Si, MD;  Location: [facility] ENDOSCOPY;  Service: Cardiovascular;  Laterality: N/A;   TUBAL LIGATION  ~ 1978   Social History   Socioeconomic History   Marital status: Single    Spouse name: Not on file   Number of children: 3   Years of education: Not  on file   Highest education level: Not on file  Occupational History   Occupation: Retired    Associate Professor: [employer]  Tobacco Use   Smoking status: Never   Smokeless tobacco: Never  Vaping Use   Vaping status: Never Used  Substance and Sexual Activity   Alcohol use: No   Drug use: No   Sexual activity: Never  Other Topics Concern   Not on file  Social History Narrative   Not on file   Social Determinants of Health   Financial Resource Strain: Not on file  Food Insecurity: No Food Insecurity (05/21/2022)   Hunger Vital Sign    Worried About Running Out  of Food in the Last Year: Never true    Ran Out of Food in the Last Year: Never true  Transportation Needs: No Transportation Needs (05/21/2022)   PRAPARE - Administrator, Civil Service (Medical): No    Lack of Transportation (Non-Medical): No  Physical Activity: Not on file  Stress: Not on file  Social Connections: Not on file  Intimate Partner Violence: Not At Risk (05/21/2022)   Humiliation, Afraid, Rape, and Kick questionnaire    Fear of Current or Ex-Partner: No    Emotionally Abused: No    Physically Abused: No    Sexually Abused: No   Current Outpatient Medications on File Prior to Visit  Medication Sig Dispense Refill   amiodarone (PACERONE) 200 MG tablet Take 0.5 tablets (100 mg total) by mouth daily. 45 tablet 3   apixaban (ELIQUIS) 5 MG TABS tablet Take 1 tablet (5 mg total) by mouth 2 (two) times daily. 180 tablet 1   atorvastatin (LIPITOR) 40 MG tablet TAKE 1 TABLET BY MOUTH  DAILY 90 tablet 2   clopidogrel (PLAVIX) 75 MG tablet TAKE 1 TABLET BY MOUTH DAILY 90 tablet 0   empagliflozin (JARDIANCE) 10 MG TABS tablet TAKE 1 TABLET BY MOUTH ONCE DAILY BEFORE BREAKFAST 30 tablet 5   ezetimibe (ZETIA) 10 MG tablet TAKE 1 TABLET BY MOUTH DAILY 90 tablet 0   fluticasone (FLONASE) 50 MCG/ACT nasal spray Place 1 spray into both nostrils at bedtime. 16 g 2   levalbuterol (XOPENEX HFA) 45 MCG/ACT inhaler  Inhale 1-2 puffs into the lungs every 6 (six) hours as needed for wheezing or shortness of breath. (Patient not taking: Reported on 08/06/2022) 15 g 3   levothyroxine (SYNTHROID) 50 MCG tablet Take 1 tablet (50 mcg total) by mouth daily. 45 tablet 3   propranolol ER (INDERAL LA) 160 MG SR capsule TAKE 1 CAPSULE BY MOUTH DAILY 90 capsule 0   sacubitril-valsartan (ENTRESTO) 24-26 MG Take 1 tablet by mouth 2 (two) times daily. 60 tablet 11   spironolactone (ALDACTONE) 25 MG tablet TAKE ONE-HALF TABLET BY MOUTH  DAILY 45 tablet 0   torsemide (DEMADEX) 20 MG tablet Take 2 tablets (40 mg total) by mouth daily. 180 tablet 0   No current facility-administered medications on file prior to visit.   No Known Allergies Family History  Problem Relation Age of Onset   Heart disease Mother    Heart disease Father    Lung disease Sister    Diabetes Sister    Heart failure Brother    Thyroid disease Neg Hx    PE: There were no vitals taken for this visit. Wt Readings from Last 3 Encounters:  08/06/22 202 lb 6.4 oz (91.8 kg)  07/16/22 204 lb 12.8 oz (92.9 kg)  05/28/22 206 lb 2.1 oz (93.5 kg)   Constitutional: overweight, in NAD, uses a walker Eyes:no exophthalmos, no lid lag, no stare  ENT: no thyromegaly, no cervical lymphadenopathy Cardiovascular: RRR, No MRG Respiratory: CTA B Musculoskeletal: no deformities Skin: no rashes Neurological: no tremor with outstretched hands  ASSESSMENT: 1.  History of amiodarone induced thyrotoxicosis - Fluctuating TFTs over the years  2.  Mild Graves' disease  PLAN:  1. Patient with history of widely fluctuating TFTs, previously on both methimazole and levothyroxine.  It was unclear why her TFTs fluctuating to this extent in the past since it was not completely expected from her diagnosis of amiodarone induced thyrotoxicosis.  The hypothesis at last visit was that she possibly  had elevated TRAb antibodies with alternating stimulating and suppressing  antibodies.  Indeed, her TSI's and TRAb antibodies were positive. -We stopped her methimazole low-dose when TSH increased to 14.  TSH improved afterwards but it was still slightly elevated at last visit, with normal free thyroid hormones.  At that time, I recommended expectant management. -However, TSH increased in 03/2022 about 9 so at that time I recommended addition of levothyroxine, especially since she had weight gain, fatigue, and cold intolerance.  - latest thyroid labs reviewed with pt. >> normal: Lab Results  Component Value Date   TSH 3.91 05/11/2022  - she continues on LT4 50 mcg daily - pt feels good on this dose.  The above symptoms have improved/resolved. - we discussed about taking the thyroid hormone every day, with water, >30 minutes before breakfast, separated by >4 hours from acid reflux medications, calcium, iron, multivitamins. Pt. is taking it correctly. - will check thyroid tests today: TSH and fT4 - If labs are abnormal, she will need to return for repeat TFTs in 1.5 months  2.  Mild Graves' disease -With elevated TSI's at last check -No signs of active Graves' ophthalmopathy: No double vision, blurry vision, eye pain, chemosis.  In fact, her vision improved significantly after her cataract surgery -We will need to keep an eye on this but no intervention is needed for now  Needs refills.  Carlus Pavlov, MD PhD [facility] Endocrinology

## 2022-08-26 NOTE — Addendum Note (Signed)
Addended by: Carlus Pavlov on: 08/26/2022 12:34 PM   Modules accepted: Orders

## 2022-09-02 NOTE — Progress Notes (Signed)
Remote ICD transmission.   

## 2022-09-03 ENCOUNTER — Telehealth: Payer: Self-pay

## 2022-09-03 NOTE — Telephone Encounter (Signed)
Alert remote transmission: RRT The device has reached the elective replacement indicator on 09/03/2022, sent to triage There were 4 short NSVT arrhythmias detected Follow up as scheduled.  ML, CVRS 4 short NSVT <1 SEC/ < 20 BEATS.    Spoke with patient and informed her that her device is at RRT.  Also, Dr. Odessa Fleming nurse/scheduler will be reaching out to her for an appt to discuss.  Explained that her device will beep once a day as a notification but nothing to be concerned about.  She is aware she can come in sooner for [place] to turn the alert off if she would like otherwise we will turn off at appt.  She verbalizes understanding.

## 2022-09-08 NOTE — Telephone Encounter (Signed)
Pt is scheduled with Dr Graciela Husbands on 09/17/2022.

## 2022-09-11 ENCOUNTER — Other Ambulatory Visit: Payer: Self-pay | Admitting: Cardiovascular Disease

## 2022-09-13 ENCOUNTER — Other Ambulatory Visit: Payer: Self-pay | Admitting: Cardiovascular Disease

## 2022-09-15 ENCOUNTER — Ambulatory Visit (INDEPENDENT_AMBULATORY_CARE_PROVIDER_SITE_OTHER): Payer: Medicare Other

## 2022-09-15 ENCOUNTER — Encounter (HOSPITAL_BASED_OUTPATIENT_CLINIC_OR_DEPARTMENT_OTHER): Payer: Self-pay | Admitting: Pulmonary Disease

## 2022-09-15 ENCOUNTER — Ambulatory Visit (INDEPENDENT_AMBULATORY_CARE_PROVIDER_SITE_OTHER): Payer: Medicare Other | Admitting: Pulmonary Disease

## 2022-09-15 VITALS — BP 112/64 | HR 74 | Resp 16 | Ht 62.0 in | Wt 198.9 lb

## 2022-09-15 DIAGNOSIS — J984 Other disorders of lung: Secondary | ICD-10-CM | POA: Diagnosis not present

## 2022-09-15 DIAGNOSIS — I5022 Chronic systolic (congestive) heart failure: Secondary | ICD-10-CM | POA: Diagnosis not present

## 2022-09-15 DIAGNOSIS — Z23 Encounter for immunization: Secondary | ICD-10-CM

## 2022-09-15 DIAGNOSIS — G4731 Primary central sleep apnea: Secondary | ICD-10-CM | POA: Diagnosis not present

## 2022-09-15 DIAGNOSIS — I255 Ischemic cardiomyopathy: Secondary | ICD-10-CM | POA: Diagnosis not present

## 2022-09-15 LAB — CUP PACEART REMOTE DEVICE CHECK
Battery Remaining Longevity: 1 mo
Battery Voltage: 2.73 V
Brady Statistic AP VP Percent: 0 %
Brady Statistic AP VS Percent: 0.11 %
Brady Statistic AS VP Percent: 0.01 %
Brady Statistic AS VS Percent: 99.88 %
Brady Statistic RA Percent Paced: 0.11 %
Brady Statistic RV Percent Paced: 0.02 %
Date Time Interrogation Session: 20240910042206
HighPow Impedance: 81 Ohm
Implantable Lead Connection Status: 753985
Implantable Lead Connection Status: 753985
Implantable Lead Implant Date: 20140718
Implantable Lead Implant Date: 20140718
Implantable Lead Location: 753859
Implantable Lead Location: 753860
Implantable Lead Model: 5076
Implantable Lead Model: 6935
Implantable Pulse Generator Implant Date: 20140718
Lead Channel Impedance Value: 323 Ohm
Lead Channel Impedance Value: 380 Ohm
Lead Channel Impedance Value: 399 Ohm
Lead Channel Pacing Threshold Amplitude: 0.625 V
Lead Channel Pacing Threshold Amplitude: 0.625 V
Lead Channel Pacing Threshold Pulse Width: 0.4 ms
Lead Channel Pacing Threshold Pulse Width: 0.4 ms
Lead Channel Sensing Intrinsic Amplitude: 13.5 mV
Lead Channel Sensing Intrinsic Amplitude: 13.5 mV
Lead Channel Sensing Intrinsic Amplitude: 3.75 mV
Lead Channel Sensing Intrinsic Amplitude: 3.75 mV
Lead Channel Setting Pacing Amplitude: 2 V
Lead Channel Setting Pacing Amplitude: 2.5 V
Lead Channel Setting Pacing Pulse Width: 0.4 ms
Lead Channel Setting Sensing Sensitivity: 0.3 mV
Zone Setting Status: 755011

## 2022-09-15 NOTE — Addendum Note (Signed)
Addended by: Jama Flavors on: 09/15/2022 10:13 AM   Modules accepted: Orders

## 2022-09-15 NOTE — Patient Instructions (Signed)
  x referral to cardiac rehab program  BiPAP supplies will be renewed. X Obtain download from adapt  Flu and RSV shot are recommended

## 2022-09-15 NOTE — Assessment & Plan Note (Signed)
Bilevel download will be reviewed.  She is compliant by history. Due to low EF she is not a candidate for ASV

## 2022-09-15 NOTE — Assessment & Plan Note (Addendum)
DLCO is severely decreased HRCT only shows small airway disease without any evidence of ILD.  She is on amiodarone and may need monitoring on a 6 monthly basis I have asked her to trial Xopenex and see if this helps her but warned her about excessive usage

## 2022-09-15 NOTE — Progress Notes (Signed)
Subjective:    Patient ID: Erin [name], female    DOB: Feb 17, 1945, 77 y.o.   MRN: [id number]  HPI  77 year old never smoker for follow-up of complex sleep apnea and dyspnea on exertion  PMH - Atrial fibrillation, on amiodarone and apixaban CAD, HFrEF, VT s/p AICD  Amiodarone induce thyrotoxicosis-amiodarone resumed due to increasing VT burden  She presents to establish care with me.  She ambulates with a walker and reports shortness of breath after walking a few steps. Oxygen saturation is 92% on arrival. We reviewed high-resolution CT chest and PFTs She does admit to a sedentary lifestyle. Compliant with torsemide, no pedal edema, no orthopnea.  She is compliant with her BiPAP machine denies any problems with mask or pressure received a new machine 2 years ago  Significant tests/ events reviewed  Echo 02/2022 EF 35 to 40%, global hypokinesis, normal RVSP  PFTs 06/08/22>> FVC 1.30 (63%), FEV1 1.05 (69%), ratio 80, DLCO 5.7/37% Mild restriction with severe diffusion defect   PFT 01/26/13 >> FEV1 1.04 (75%), FEV1% 73, DLCO 50%   BiPAP titration study 08/2020 treatment emergent central apneas, 18/13 was an adequate  PSG 2006 >> AHI 77   08/2022 HRCT chest no ILD, mild patchy air trapping?  Small airway disease and bronchial wall thickening, no tracheobronchomalacia, 3 mm right middle lobe nodule    Past Medical History:  Diagnosis Date   Anginal pain (HCC)    Automatic implantable cardioverter-defibrillator in situ    a. s/p MDT ICD 07/2012; b. SN # [id number]    CAD (coronary artery disease)    a. cath 2003: LM nl, mid LAD 50%, LCx nl, RCA nl b. L&RHC 08/17/2014 100% occluded mid LCx, 70% mid LAD with FFR 0.82. Medical therapy. CI 2.1. CO 4.01c. LHC 8/18: CTO LCx w/ R-L colatts, LAD s/p PCI/DES x 2   Chronic systolic CHF (congestive heart failure) (HCC)    a. echo 2014: EF 25%, diffuse HK, LA moderately dilated, RA mildly dilated, PASP 38 mm Hg; b. echo 08/2014: EF 25-30%,  diffuse HK, RWMA cannot be excluded, mild MR, mild biatrial enlargement, RV mildly dilated, wall thickness nl, pacer wire/catheter noted, mild to mod TR, PASP high nl, c. TTE 8/18: EF 25-30%, diffuse HK, GR1DD, mod MR, mod dilated LA, nl RV sys fxn, PASP 56   Encounter for long-term (current) use of anticoagulants    Exertional shortness of breath    H/O medication noncompliance    High cholesterol    Hypertension    NICM (nonischemic cardiomyopathy) (HCC)    a. s/p AICD 07/22/2012   Obesity    OSA on CPAP    "suppose to; not often" (07/22/2012)   Other "heavy-for-dates" infants    PAF (paroxysmal atrial fibrillation) (HCC)    a. on warfarin--> Eliquis 08/2014     Review of Systems neg for any significant sore throat, dysphagia, itching, sneezing, nasal congestion or excess/ purulent secretions, fever, chills, sweats, unintended wt loss, pleuritic or exertional cp, hempoptysis, orthopnea pnd or change in chronic leg swelling. Also denies presyncope, palpitations, heartburn, abdominal pain, nausea, vomiting, diarrhea or change in bowel or urinary habits, dysuria,hematuria, rash, arthralgias, visual complaints, headache, numbness weakness or ataxia.     Objective:   Physical Exam  Gen. Pleasant, obese, in no distress ENT - no lesions, no post nasal drip Neck: No JVD, no thyromegaly, no carotid bruits Lungs: no use of accessory muscles, no dullness to percussion, decreased without rales or rhonchi  Cardiovascular: Rhythm  regular, heart sounds  normal, no murmurs or gallops, no peripheral edema Musculoskeletal: No deformities, no cyanosis or clubbing , no tremors       Assessment & Plan:

## 2022-09-15 NOTE — Assessment & Plan Note (Signed)
She will qualify for cardiac rehab program

## 2022-09-17 ENCOUNTER — Ambulatory Visit: Payer: Medicare Other | Attending: Internal Medicine | Admitting: Internal Medicine

## 2022-09-17 ENCOUNTER — Encounter: Payer: Self-pay | Admitting: Internal Medicine

## 2022-09-17 VITALS — BP 118/66 | HR 68 | Ht 62.0 in | Wt 199.0 lb

## 2022-09-17 DIAGNOSIS — I255 Ischemic cardiomyopathy: Secondary | ICD-10-CM

## 2022-09-17 DIAGNOSIS — I472 Ventricular tachycardia, unspecified: Secondary | ICD-10-CM

## 2022-09-17 DIAGNOSIS — I480 Paroxysmal atrial fibrillation: Secondary | ICD-10-CM

## 2022-09-17 DIAGNOSIS — I5022 Chronic systolic (congestive) heart failure: Secondary | ICD-10-CM

## 2022-09-17 DIAGNOSIS — Z95810 Presence of automatic (implantable) cardiac defibrillator: Secondary | ICD-10-CM

## 2022-09-17 MED ORDER — APIXABAN 5 MG PO TABS: 5.0000 mg | ORAL_TABLET | Freq: Two times a day (BID) | ORAL | 1 refills | Status: DC

## 2022-09-17 NOTE — Patient Instructions (Addendum)
Medication Instructions:  STOP Plavix   *If you need a refill on your cardiac medications before your next appointment, please call your pharmacy*  Labs: CBC, BMET today   Testing:  Device Gen Change (follow instruction sheet given)   Follow-Up: At [facility], you and your health needs are our priority.  As part of our continuing mission to provide you with exceptional heart care, we have created designated Provider Care Teams.  These Care Teams include your primary Cardiologist (physician) and Advanced Practice Providers (APPs -  Physician Assistants and Nurse Practitioners) who all work together to provide you with the care you need, when you need it.  We recommend signing up for the patient portal called "MyChart".  Sign up information is provided on this After Visit Summary.  MyChart is used to connect with patients for Virtual Visits (Telemedicine).  Patients are able to view lab/test results, encounter notes, upcoming appointments, etc.  Non-urgent messages can be sent to your provider as well.   To learn more about what you can do with MyChart, go to [email or web address].    Your next appointment:   We will be in contact for a follow up visit.  Device clinic 10-14 days post, and 91 day follow up with Dr.Klein.

## 2022-09-17 NOTE — Progress Notes (Signed)
Patient Care Team: Cain Saupe, MD as PCP - General (Family Medicine) Duke Salvia, MD as PCP - Electrophysiology (Cardiology) Antonieta Iba, MD as PCP - Cardiology (Cardiology) Delma Freeze, FNP as Nurse Practitioner (Cardiology) Duke Salvia, MD as Consulting Physician (Cardiology) Antonieta Iba, MD as Consulting Physician (Cardiology)   HPI  Erin [name] is a 77 y.o. female seen in followup for Medtronic ICD implanted in the context of a nonischemic and ischemic cardiomyopathy and VT; Hx of afib- persistent. Stenting x2 on Clopidigrel and anticoagulation w apixoban.  Device at RRT  VT previously managed with amio, but stopped 2/2 hyperthyroidism, Rx w mex alone, then resumed Amio 2/2 increasing VT burden ( discussed with Dr Lorel Monaco -Endo)  TSH noted 12/22 to be high, and methimazole redosed per SE   The patient denies chest pain, nocturnal dyspnea, orthopnea or peripheral edema.  There have been no palpitations, lightheadedness or syncope.  Complains of chronic mild DOE.  Patient denies symptoms of respiratory, GI intolerance, sun sensitivity, neurological symptoms attributable to amiodarone.    Chronic cough and now being folloed by pulmonary   She has had issues with varying thyroid states, hyper and hypo and further eval >> TRAB elevated and also TRI (inhibiting ABs) Close f/u with Dr CG  8/23--ICD alarm.  There was appropriate ATP.  Apparently a question about high RV threshold although the report, RD-PA, "no evidence of RV lead failure "          Date Cr K Hgb TSH ALT  3/21    0.02<<0.01   5/21   12.6 (7/21) 2.07 19  12/21 1.17   4.46<< 75   6/22 1.29   6.08   8/22 1.83      9/22 1.6  4.2 6.84 (12/22)<<4.39 16  3/23    14.4 33 (6/23)  10/23    6.84   1/24 1.68  4.2  76  8/24 1.36 4.4 13.1 0.01 42    DATE TEST EF   5/14 Echo  20-25%   8/16 Echo  25 % (44/2.1/35)m LAE  8/18 Echo  20-25%   8/18 Cath  T Cx///90% LAD>>DES  11/18 Echo   25-30%    6/21 PYP  Ratio 1 equivocal  8/23 Echo  35-40%   2/24 Echo  35-40%     PVCs  2.5 /Hr >> 15/hr (6/23)     Thromboembolic risk factors ( age -34 , HTN-1, Vasc disease -1, CHF-1, Gender-1) for a CHADSVASc Score of >=6  Past Medical History:  Diagnosis Date   Anginal pain (HCC)    Automatic implantable cardioverter-defibrillator in situ    a. s/p MDT ICD 07/2012; b. SN # [id number]    CAD (coronary artery disease)    a. cath 2003: LM nl, mid LAD 50%, LCx nl, RCA nl b. L&RHC 08/17/2014 100% occluded mid LCx, 70% mid LAD with FFR 0.82. Medical therapy. CI 2.1. CO 4.01c. LHC 8/18: CTO LCx w/ R-L colatts, LAD s/p PCI/DES x 2   Chronic systolic CHF (congestive heart failure) (HCC)    a. echo 2014: EF 25%, diffuse HK, LA moderately dilated, RA mildly dilated, PASP 38 mm Hg; b. echo 08/2014: EF 25-30%, diffuse HK, RWMA cannot be excluded, mild MR, mild biatrial enlargement, RV mildly dilated, wall thickness nl, pacer wire/catheter noted, mild to mod TR, PASP high nl, c. TTE 8/18: EF 25-30%, diffuse HK, GR1DD, mod MR, mod dilated LA, nl RV sys fxn,  PASP 56   Encounter for long-term (current) use of anticoagulants    Exertional shortness of breath    H/O medication noncompliance    High cholesterol    Hypertension    NICM (nonischemic cardiomyopathy) (HCC)    a. s/p AICD 07/22/2012   Obesity    OSA on CPAP    "suppose to; not often" (07/22/2012)   Other "heavy-for-dates" infants    PAF (paroxysmal atrial fibrillation) (HCC)    a. on warfarin--> Eliquis 08/2014    Past Surgical History:  Procedure Laterality Date   ABDOMINAL HYSTERECTOMY  ~ 1998   "laparoscopic" (07/22/2012)   CARDIAC CATHETERIZATION  ? 1990's   CARDIAC CATHETERIZATION N/A 08/17/2014   Procedure: Right/Left Heart Cath and Coronary Angiography;  Surgeon: Kathleene Hazel, MD;  Location: [facility] INVASIVE CV LAB;  Service: Cardiovascular;  Laterality: N/A;   CARDIAC DEFIBRILLATOR PLACEMENT  07/22/2012   dual-chamber ICD.    CARDIOVERSION N/A 01/13/2019   Procedure: CARDIOVERSION;  Surgeon: Antonieta Iba, MD;  Location: [facility] ORS;  Service: Cardiovascular;  Laterality: N/A;   CORONARY STENT INTERVENTION N/A 08/24/2016   Procedure: CORONARY STENT INTERVENTION;  Surgeon: Iran Ouch, MD;  Location: [facility] INVASIVE CV LAB;  Service: Cardiovascular;  Laterality: N/A;   IMPLANTABLE CARDIOVERTER DEFIBRILLATOR IMPLANT N/A 07/22/2012   Procedure: IMPLANTABLE CARDIOVERTER DEFIBRILLATOR IMPLANT;  Surgeon: Marinus Maw, MD;  Location: [facility] CATH LAB;  Service: Cardiovascular;  Laterality: N/A;   LEFT HEART CATH AND CORONARY ANGIOGRAPHY N/A 08/24/2016   Procedure: LEFT HEART CATH AND CORONARY ANGIOGRAPHY;  Surgeon: Iran Ouch, MD;  Location: [facility] INVASIVE CV LAB;  Service: Cardiovascular;  Laterality: N/A;   TEE WITHOUT CARDIOVERSION N/A 08/21/2014   Procedure: TRANSESOPHAGEAL ECHOCARDIOGRAM (TEE);  Surgeon: Chilton Si, MD;  Location: [facility] ENDOSCOPY;  Service: Cardiovascular;  Laterality: N/A;   TUBAL LIGATION  ~ 1978    Current Outpatient Medications  Medication Sig Dispense Refill   amiodarone (PACERONE) 200 MG tablet Take 0.5 tablets (100 mg total) by mouth daily. 45 tablet 3   apixaban (ELIQUIS) 5 MG TABS tablet Take 1 tablet (5 mg total) by mouth 2 (two) times daily. 180 tablet 1   atorvastatin (LIPITOR) 40 MG tablet TAKE 1 TABLET BY MOUTH DAILY 90 tablet 2   clopidogrel (PLAVIX) 75 MG tablet TAKE 1 TABLET BY MOUTH DAILY 90 tablet 0   empagliflozin (JARDIANCE) 10 MG TABS tablet TAKE 1 TABLET BY MOUTH ONCE DAILY BEFORE BREAKFAST 30 tablet 5   ezetimibe (ZETIA) 10 MG tablet TAKE 1 TABLET BY MOUTH DAILY 90 tablet 0   fluticasone (FLONASE) 50 MCG/ACT nasal spray Place 1 spray into both nostrils at bedtime. 16 g 2   levalbuterol (XOPENEX HFA) 45 MCG/ACT inhaler Inhale 1-2 puffs into the lungs every 6 (six) hours as needed for wheezing or shortness of breath. 15 g 3   propranolol ER (INDERAL LA) 160 MG SR capsule  TAKE 1 CAPSULE BY MOUTH DAILY 90 capsule 2   sacubitril-valsartan (ENTRESTO) 24-26 MG Take 1 tablet by mouth 2 (two) times daily. 60 tablet 11   spironolactone (ALDACTONE) 25 MG tablet TAKE ONE-HALF TABLET BY MOUTH  DAILY 45 tablet 1   torsemide (DEMADEX) 20 MG tablet Take 2 tablets (40 mg total) by mouth daily. 180 tablet 0   No current facility-administered medications for this visit.    No Known Allergies  Review of Systems negative except from HPI and PMH  Physical Exam    BP 118/66 (BP Location: Left Arm, Patient Position:  Sitting, Cuff Size: Large)   Pulse 68   Ht 5\' 2"  (1.575 m)   Wt 199 lb (90.3 kg)   SpO2 97%   BMI 36.40 kg/m  Well developed and Morbidly obese in no acute distress HENT normal Neck supple with JVP-flat Clear Device pocket well healed; without hematoma or erythema.  There is no tethering  Regular rate and rhythm, no  gallop No / murmur Abd-soft with active BS No Clubbing cyanosis  edema Skin-warm and dry A & Oriented  Grossly normal sensory and motor function  ECG sinus at 71 Intervals 20/10/40 Low voltage Unusual P wave axis  Device function is normal x battery at RRT Programming changes none  See Paceart for details     Assessment and  Plan Atrial fibrillation-persistent  Nonischemic cardiomyopathy  Coronary artery disease-2 vessel with recent stenting  Ventricular tachycardia-recurrent treated via device  Implantable defibrillator-Medtronic  Treated hyperthyroidism/hypothryoidism  Congestive heart failure-chronic sys  Hypertransaminasemia normalized off amiodarone-- normal on resumed amio   PVCs  Low voltage ECG >> neg PYP   Hypertransaminasemia   No interval treated ventricular arrhythmias, none since 8/22.  Is released to drive.  Will continue the amiodarone at 100 mg.  Hyper transaminasemia has normalized  Device at RRT. We have reviewed the benefits and risks of generator replacement.  These include but are not limited  to lead fracture and infection.  The patient understands, agrees and is willing to proceed.     Dr. Reino Kent is closely monitoring her thyroid condition.  With her left ventricular dysfunction, continue Aldactone and Entresto; she is on propranolol, not guideline directed but being used for its late acting sodium channel blocking properties to help augment rhythm control; continue Farxiga   No symptoms of ischemia.  Will continue Eliquis but discontinue clopidogrel, I did review this with Dr. Marcheta Grammes.  Continue statin

## 2022-09-17 NOTE — H&P (View-Only) (Signed)
Patient Care Team: Cain Saupe, MD as PCP - General (Family Medicine) Duke Salvia, MD as PCP - Electrophysiology (Cardiology) Antonieta Iba, MD as PCP - Cardiology (Cardiology) Delma Freeze, FNP as Nurse Practitioner (Cardiology) Duke Salvia, MD as Consulting Physician (Cardiology) Antonieta Iba, MD as Consulting Physician (Cardiology)   HPI  Erin [name] is a 77 y.o. female seen in followup for Medtronic ICD implanted in the context of a nonischemic and ischemic cardiomyopathy and VT; Hx of afib- persistent. Stenting x2 on Clopidigrel and anticoagulation w apixoban.  Device at RRT  VT previously managed with amio, but stopped 2/2 hyperthyroidism, Rx w mex alone, then resumed Amio 2/2 increasing VT burden ( discussed with Dr Lorel Monaco -Endo)  TSH noted 12/22 to be high, and methimazole redosed per SE   The patient denies chest pain, nocturnal dyspnea, orthopnea or peripheral edema.  There have been no palpitations, lightheadedness or syncope.  Complains of chronic mild DOE.  Patient denies symptoms of respiratory, GI intolerance, sun sensitivity, neurological symptoms attributable to amiodarone.    Chronic cough and now being folloed by pulmonary   She has had issues with varying thyroid states, hyper and hypo and further eval >> TRAB elevated and also TRI (inhibiting ABs) Close f/u with Dr CG  8/23--ICD alarm.  There was appropriate ATP.  Apparently a question about high RV threshold although the report, RD-PA, "no evidence of RV lead failure "          Date Cr K Hgb TSH ALT  3/21    0.02<<0.01   5/21   12.6 (7/21) 2.07 19  12/21 1.17   4.46<< 75   6/22 1.29   6.08   8/22 1.83      9/22 1.6  4.2 6.84 (12/22)<<4.39 16  3/23    14.4 33 (6/23)  10/23    6.84   1/24 1.68  4.2  76  8/24 1.36 4.4 13.1 0.01 42    DATE TEST EF   5/14 Echo  20-25%   8/16 Echo  25 % (44/2.1/35)m LAE  8/18 Echo  20-25%   8/18 Cath  T Cx///90% LAD>>DES  11/18 Echo   25-30%    6/21 PYP  Ratio 1 equivocal  8/23 Echo  35-40%   2/24 Echo  35-40%     PVCs  2.5 /Hr >> 15/hr (6/23)     Thromboembolic risk factors ( age -34 , HTN-1, Vasc disease -1, CHF-1, Gender-1) for a CHADSVASc Score of >=6  Past Medical History:  Diagnosis Date   Anginal pain (HCC)    Automatic implantable cardioverter-defibrillator in situ    a. s/p MDT ICD 07/2012; b. SN # PJN [id number]    CAD (coronary artery disease)    a. cath 2003: LM nl, mid LAD 50%, LCx nl, RCA nl b. L&RHC 08/17/2014 100% occluded mid LCx, 70% mid LAD with FFR 0.82. Medical therapy. CI 2.1. CO 4.01c. LHC 8/18: CTO LCx w/ R-L colatts, LAD s/p PCI/DES x 2   Chronic systolic CHF (congestive heart failure) (HCC)    a. echo 2014: EF 25%, diffuse HK, LA moderately dilated, RA mildly dilated, PASP 38 mm Hg; b. echo 08/2014: EF 25-30%, diffuse HK, RWMA cannot be excluded, mild MR, mild biatrial enlargement, RV mildly dilated, wall thickness nl, pacer wire/catheter noted, mild to mod TR, PASP high nl, c. TTE 8/18: EF 25-30%, diffuse HK, GR1DD, mod MR, mod dilated LA, nl RV sys fxn,  PASP 56   Encounter for long-term (current) use of anticoagulants    Exertional shortness of breath    H/O medication noncompliance    High cholesterol    Hypertension    NICM (nonischemic cardiomyopathy) (HCC)    a. s/p AICD 07/22/2012   Obesity    OSA on CPAP    "suppose to; not often" (07/22/2012)   Other "heavy-for-dates" infants    PAF (paroxysmal atrial fibrillation) (HCC)    a. on warfarin--> Eliquis 08/2014    Past Surgical History:  Procedure Laterality Date   ABDOMINAL HYSTERECTOMY  ~ 1998   "laparoscopic" (07/22/2012)   CARDIAC CATHETERIZATION  ? 1990's   CARDIAC CATHETERIZATION N/A 08/17/2014   Procedure: Right/Left Heart Cath and Coronary Angiography;  Surgeon: Kathleene Hazel, MD;  Location: [facility] INVASIVE CV LAB;  Service: Cardiovascular;  Laterality: N/A;   CARDIAC DEFIBRILLATOR PLACEMENT  07/22/2012   dual-chamber ICD.    CARDIOVERSION N/A 01/13/2019   Procedure: CARDIOVERSION;  Surgeon: Antonieta Iba, MD;  Location: [facility] ORS;  Service: Cardiovascular;  Laterality: N/A;   CORONARY STENT INTERVENTION N/A 08/24/2016   Procedure: CORONARY STENT INTERVENTION;  Surgeon: Iran Ouch, MD;  Location: [facility] INVASIVE CV LAB;  Service: Cardiovascular;  Laterality: N/A;   IMPLANTABLE CARDIOVERTER DEFIBRILLATOR IMPLANT N/A 07/22/2012   Procedure: IMPLANTABLE CARDIOVERTER DEFIBRILLATOR IMPLANT;  Surgeon: Marinus Maw, MD;  Location: [facility] CATH LAB;  Service: Cardiovascular;  Laterality: N/A;   LEFT HEART CATH AND CORONARY ANGIOGRAPHY N/A 08/24/2016   Procedure: LEFT HEART CATH AND CORONARY ANGIOGRAPHY;  Surgeon: Iran Ouch, MD;  Location: [facility] INVASIVE CV LAB;  Service: Cardiovascular;  Laterality: N/A;   TEE WITHOUT CARDIOVERSION N/A 08/21/2014   Procedure: TRANSESOPHAGEAL ECHOCARDIOGRAM (TEE);  Surgeon: Chilton Si, MD;  Location: [facility] ENDOSCOPY;  Service: Cardiovascular;  Laterality: N/A;   TUBAL LIGATION  ~ 1978    Current Outpatient Medications  Medication Sig Dispense Refill   amiodarone (PACERONE) 200 MG tablet Take 0.5 tablets (100 mg total) by mouth daily. 45 tablet 3   apixaban (ELIQUIS) 5 MG TABS tablet Take 1 tablet (5 mg total) by mouth 2 (two) times daily. 180 tablet 1   atorvastatin (LIPITOR) 40 MG tablet TAKE 1 TABLET BY MOUTH DAILY 90 tablet 2   clopidogrel (PLAVIX) 75 MG tablet TAKE 1 TABLET BY MOUTH DAILY 90 tablet 0   empagliflozin (JARDIANCE) 10 MG TABS tablet TAKE 1 TABLET BY MOUTH ONCE DAILY BEFORE BREAKFAST 30 tablet 5   ezetimibe (ZETIA) 10 MG tablet TAKE 1 TABLET BY MOUTH DAILY 90 tablet 0   fluticasone (FLONASE) 50 MCG/ACT nasal spray Place 1 spray into both nostrils at bedtime. 16 g 2   levalbuterol (XOPENEX HFA) 45 MCG/ACT inhaler Inhale 1-2 puffs into the lungs every 6 (six) hours as needed for wheezing or shortness of breath. 15 g 3   propranolol ER (INDERAL LA) 160 MG SR capsule  TAKE 1 CAPSULE BY MOUTH DAILY 90 capsule 2   sacubitril-valsartan (ENTRESTO) 24-26 MG Take 1 tablet by mouth 2 (two) times daily. 60 tablet 11   spironolactone (ALDACTONE) 25 MG tablet TAKE ONE-HALF TABLET BY MOUTH  DAILY 45 tablet 1   torsemide (DEMADEX) 20 MG tablet Take 2 tablets (40 mg total) by mouth daily. 180 tablet 0   No current facility-administered medications for this visit.    No Known Allergies  Review of Systems negative except from HPI and PMH  Physical Exam    BP 118/66 (BP Location: Left Arm, Patient Position:  Sitting, Cuff Size: Large)   Pulse 68   Ht 5\' 2"  (1.575 m)   Wt 199 lb (90.3 kg)   SpO2 97%   BMI 36.40 kg/m  Well developed and Morbidly obese in no acute distress HENT normal Neck supple with JVP-flat Clear Device pocket well healed; without hematoma or erythema.  There is no tethering  Regular rate and rhythm, no  gallop No / murmur Abd-soft with active BS No Clubbing cyanosis  edema Skin-warm and dry A & Oriented  Grossly normal sensory and motor function  ECG sinus at 71 Intervals 20/10/40 Low voltage Unusual P wave axis  Device function is normal x battery at RRT Programming changes none  See Paceart for details     Assessment and  Plan Atrial fibrillation-persistent  Nonischemic cardiomyopathy  Coronary artery disease-2 vessel with recent stenting  Ventricular tachycardia-recurrent treated via device  Implantable defibrillator-Medtronic  Treated hyperthyroidism/hypothryoidism  Congestive heart failure-chronic sys  Hypertransaminasemia normalized off amiodarone-- normal on resumed amio   PVCs  Low voltage ECG >> neg PYP   Hypertransaminasemia   No interval treated ventricular arrhythmias, none since 8/22.  Is released to drive.  Will continue the amiodarone at 100 mg.  Hyper transaminasemia has normalized  Device at RRT. We have reviewed the benefits and risks of generator replacement.  These include but are not limited  to lead fracture and infection.  The patient understands, agrees and is willing to proceed.     Dr. Reino Kent is closely monitoring her thyroid condition.  With her left ventricular dysfunction, continue Aldactone and Entresto; she is on propranolol, not guideline directed but being used for its late acting sodium channel blocking properties to help augment rhythm control; continue Farxiga   No symptoms of ischemia.  Will continue Eliquis but discontinue clopidogrel, I did review this with Dr. Marcheta Grammes.  Continue statin

## 2022-09-18 LAB — BASIC METABOLIC PANEL
BUN/Creatinine Ratio: 15 (ref 12–28)
BUN: 20 mg/dL (ref 8–27)
CO2: 23 mmol/L (ref 20–29)
Calcium: 8.6 mg/dL — ABNORMAL LOW (ref 8.7–10.3)
Chloride: 103 mmol/L (ref 96–106)
Creatinine, Ser: 1.31 mg/dL — ABNORMAL HIGH (ref 0.57–1.00)
Glucose: 86 mg/dL (ref 70–99)
Potassium: 4.4 mmol/L (ref 3.5–5.2)
Sodium: 142 mmol/L (ref 134–144)
eGFR: 42 mL/min/{1.73_m2} — ABNORMAL LOW (ref >59)

## 2022-09-18 LAB — CBC
Hematocrit: 40.9 % (ref 34.0–46.6)
Hemoglobin: 12.9 g/dL (ref 11.1–15.9)
MCH: 26.1 pg — ABNORMAL LOW (ref 26.6–33.0)
MCHC: 31.5 g/dL (ref 31.5–35.7)
MCV: 83 fL (ref 79–97)
Platelets: 247 10*3/uL (ref 150–450)
RBC: 4.94 x10E6/uL (ref 3.77–5.28)
RDW: 13.6 % (ref 11.7–15.4)
WBC: 5.8 10*3/uL (ref 3.4–10.8)

## 2022-09-21 ENCOUNTER — Telehealth: Payer: Self-pay | Admitting: Cardiovascular Disease

## 2022-09-21 ENCOUNTER — Other Ambulatory Visit: Payer: Self-pay

## 2022-09-21 MED ORDER — EMPAGLIFLOZIN 10 MG PO TABS: 10.0000 mg | ORAL_TABLET | Freq: Every day | ORAL | 5 refills | Status: DC

## 2022-09-21 NOTE — Telephone Encounter (Signed)
Disp Refills Start End   empagliflozin (JARDIANCE) 10 MG TABS tablet 30 tablet 5 09/21/2022 --   Sig - Route: Take 1 tablet (10 mg total) by mouth daily before breakfast. - Oral   Sent to pharmacy as: empagliflozin (JARDIANCE) 10 MG Tab tablet   E-Prescribing Status: Sent to pharmacy (09/21/2022  2:00 PM EDT)    Pharmacy  [id number] - [place], [place] - [address]

## 2022-09-21 NOTE — Telephone Encounter (Signed)
*  STAT* If patient is at the pharmacy, call can be transferred to refill team.   1. Which medications need to be refilled? (please list name of each medication and dose if known) empagliflozin (JARDIANCE) 10 MG TABS tablet   2. Which pharmacy/location (including street and city if local pharmacy) is medication to be sent to? [facility] [address], [place] - [address]   3. Do they need a 30 day or 90 day supply? 90

## 2022-09-22 ENCOUNTER — Other Ambulatory Visit: Payer: Self-pay | Admitting: *Deleted

## 2022-09-22 DIAGNOSIS — I5022 Chronic systolic (congestive) heart failure: Secondary | ICD-10-CM

## 2022-09-23 ENCOUNTER — Telehealth (HOSPITAL_COMMUNITY): Payer: Self-pay

## 2022-09-23 ENCOUNTER — Encounter (HOSPITAL_COMMUNITY): Payer: Self-pay

## 2022-09-23 NOTE — Telephone Encounter (Signed)
Pt returned phone call and stated that she is interested in the pulmonary rehab program. I have passed pt information to the nurse navigator for review.

## 2022-09-24 ENCOUNTER — Telehealth (HOSPITAL_COMMUNITY): Payer: Self-pay

## 2022-09-24 NOTE — Telephone Encounter (Signed)
Pt insurance is active and benefits verified through [facility] Medicare Co-pay $15, DED 0/0 met, out of pocket $3,600/$1,804.55 met, co-insurance 0%. no pre-authorization required, Pam/[employer] 09/24/2022@1 :55, REF# [id number]

## 2022-10-01 ENCOUNTER — Other Ambulatory Visit: Payer: Self-pay | Admitting: *Deleted

## 2022-10-01 ENCOUNTER — Other Ambulatory Visit: Payer: Self-pay | Admitting: Cardiovascular Disease

## 2022-10-02 NOTE — Progress Notes (Signed)
Remote ICD transmission.   

## 2022-10-06 ENCOUNTER — Telehealth: Payer: Self-pay | Admitting: *Deleted

## 2022-10-06 NOTE — Telephone Encounter (Signed)
Spoke with patient and discussed her procedure on Thursday 10/3. She verbalized understanding of instructions with no further questions at this time.

## 2022-10-08 ENCOUNTER — Other Ambulatory Visit: Payer: Self-pay

## 2022-10-08 ENCOUNTER — Encounter: Payer: Self-pay | Admitting: Internal Medicine

## 2022-10-08 ENCOUNTER — Ambulatory Visit
Admission: RE | Admit: 2022-10-08 | Discharge: 2022-10-08 | Disposition: A | Discharge status: Home / Self Care | Payer: Medicare Other | Attending: Internal Medicine | Admitting: Internal Medicine

## 2022-10-08 ENCOUNTER — Encounter
Admission: RE | Disposition: A | Discharge status: Home / Self Care | Payer: Self-pay | Source: Home / Self Care | Attending: Internal Medicine

## 2022-10-08 DIAGNOSIS — Z45010 Encounter for checking and testing of cardiac pacemaker pulse generator [battery]: Secondary | ICD-10-CM | POA: Diagnosis present

## 2022-10-08 DIAGNOSIS — Z955 Presence of coronary angioplasty implant and graft: Secondary | ICD-10-CM | POA: Insufficient documentation

## 2022-10-08 DIAGNOSIS — Z79899 Other long term (current) drug therapy: Secondary | ICD-10-CM | POA: Diagnosis not present

## 2022-10-08 DIAGNOSIS — I493 Ventricular premature depolarization: Secondary | ICD-10-CM | POA: Insufficient documentation

## 2022-10-08 DIAGNOSIS — I4819 Other persistent atrial fibrillation: Secondary | ICD-10-CM | POA: Insufficient documentation

## 2022-10-08 DIAGNOSIS — I5022 Chronic systolic (congestive) heart failure: Secondary | ICD-10-CM | POA: Insufficient documentation

## 2022-10-08 DIAGNOSIS — I2510 Atherosclerotic heart disease of native coronary artery without angina pectoris: Secondary | ICD-10-CM | POA: Diagnosis not present

## 2022-10-08 DIAGNOSIS — I255 Ischemic cardiomyopathy: Secondary | ICD-10-CM | POA: Diagnosis not present

## 2022-10-08 DIAGNOSIS — Z7901 Long term (current) use of anticoagulants: Secondary | ICD-10-CM | POA: Diagnosis not present

## 2022-10-08 DIAGNOSIS — I429 Cardiomyopathy, unspecified: Secondary | ICD-10-CM | POA: Diagnosis not present

## 2022-10-08 DIAGNOSIS — Z90710 Acquired absence of both cervix and uterus: Secondary | ICD-10-CM | POA: Diagnosis not present

## 2022-10-08 DIAGNOSIS — I110 Hypertensive heart disease with heart failure: Secondary | ICD-10-CM | POA: Diagnosis not present

## 2022-10-08 DIAGNOSIS — E0590 Thyrotoxicosis, unspecified without thyrotoxic crisis or storm: Secondary | ICD-10-CM | POA: Diagnosis not present

## 2022-10-08 DIAGNOSIS — Z95810 Presence of automatic (implantable) cardiac defibrillator: Secondary | ICD-10-CM

## 2022-10-08 DIAGNOSIS — Z4502 Encounter for adjustment and management of automatic implantable cardiac defibrillator: Secondary | ICD-10-CM | POA: Diagnosis not present

## 2022-10-08 DIAGNOSIS — I472 Ventricular tachycardia, unspecified: Secondary | ICD-10-CM | POA: Diagnosis not present

## 2022-10-08 HISTORY — PX: PPM GENERATOR CHANGEOUT: EP1233

## 2022-10-08 SURGERY — PPM GENERATOR CHANGEOUT
Anesthesia: Moderate Sedation

## 2022-10-08 MED ORDER — FENTANYL CITRATE (PF) 100 MCG/2ML IJ SOLN
INTRAMUSCULAR | Status: DC | PRN
  Administered 2022-10-08: 25 ug via INTRAVENOUS

## 2022-10-08 MED ORDER — FENTANYL CITRATE (PF) 100 MCG/2ML IJ SOLN
INTRAMUSCULAR | Status: AC
  Filled 2022-10-08: qty 2

## 2022-10-08 MED ORDER — SODIUM CHLORIDE 0.9 % IV SOLN: INTRAVENOUS | Status: DC

## 2022-10-08 MED ORDER — HEPARIN (PORCINE) IN NACL 1000-0.9 UT/500ML-% IV SOLN
INTRAVENOUS | Status: DC | PRN
  Administered 2022-10-08: 500 mL

## 2022-10-08 MED ORDER — CHLORHEXIDINE GLUCONATE 4 % EX SOLN: 4.0000 | Freq: Once | CUTANEOUS | Status: DC

## 2022-10-08 MED ORDER — CEFAZOLIN SODIUM-DEXTROSE 2-4 GM/100ML-% IV SOLN
INTRAVENOUS | Status: AC
  Filled 2022-10-08: qty 100

## 2022-10-08 MED ORDER — SODIUM CHLORIDE 0.9 % IV SOLN: INTRAVENOUS | Status: AC

## 2022-10-08 MED ORDER — SODIUM CHLORIDE 0.9 % IV SOLN
80.0000 mg | INTRAVENOUS | Status: AC
  Administered 2022-10-08: 80 mg
  Filled 2022-10-08: qty 2

## 2022-10-08 MED ORDER — HEPARIN (PORCINE) IN NACL 1000-0.9 UT/500ML-% IV SOLN
INTRAVENOUS | Status: AC
  Filled 2022-10-08: qty 500

## 2022-10-08 MED ORDER — CHLORHEXIDINE GLUCONATE CLOTH 2 % EX PADS: 6.0000 | MEDICATED_PAD | Freq: Every day | CUTANEOUS | Status: DC

## 2022-10-08 MED ORDER — LIDOCAINE HCL (PF) 1 % IJ SOLN
INTRAMUSCULAR | Status: DC | PRN
  Administered 2022-10-08: 20 mL

## 2022-10-08 MED ORDER — LIDOCAINE HCL 1 % IJ SOLN
INTRAMUSCULAR | Status: AC
  Filled 2022-10-08: qty 20

## 2022-10-08 MED ORDER — CEFAZOLIN SODIUM-DEXTROSE 2-4 GM/100ML-% IV SOLN
2.0000 g | INTRAVENOUS | Status: AC
  Administered 2022-10-08: 2 g via INTRAVENOUS

## 2022-10-08 MED ORDER — MIDAZOLAM HCL 2 MG/2ML IJ SOLN
INTRAMUSCULAR | Status: DC | PRN
  Administered 2022-10-08: 2 mg via INTRAVENOUS

## 2022-10-08 MED ORDER — ACETAMINOPHEN 325 MG PO TABS: 325.0000 mg | ORAL_TABLET | ORAL | Status: DC | PRN

## 2022-10-08 MED ORDER — MIDAZOLAM HCL 2 MG/2ML IJ SOLN
INTRAMUSCULAR | Status: AC
  Filled 2022-10-08: qty 2

## 2022-10-08 SURGICAL SUPPLY — 12 items
CABLE SURG 12 DISP A/V CHANNEL (MISCELLANEOUS) IMPLANT
DEVICE DSSCT PLSMBLD 3.0S LGHT (MISCELLANEOUS) IMPLANT
ICD COBALT XT DR DDPA2D1 (ICD Generator) IMPLANT
KIT WRENCH (KITS) IMPLANT
PAD ELECT DEFIB RADIOL ZOLL (MISCELLANEOUS) IMPLANT
PANNUS RETENTION SYSTEM 2 PAD (MISCELLANEOUS) IMPLANT
PLASMABLADE 3.0S W/LIGHT (MISCELLANEOUS) ×1
SUT SILK 2 0 SH CR/8 (SUTURE) IMPLANT
SUT VIC AB 2-0 CT1 27 (SUTURE) ×1
SUT VIC AB 2-0 CT1 TAPERPNT 27 (SUTURE) IMPLANT
SUT VIC AB 3-0 X1 27 (SUTURE) IMPLANT
TRAY PACEMAKER INSERTION (PACKS) ×1 IMPLANT

## 2022-10-08 NOTE — Discharge Instructions (Addendum)
Pacemaker Battery Change, Care After This sheet gives you information about how to care for yourself after your procedure. Your health care provider may also give you more specific instructions. If you have problems or questions, contact your health care provider. What can I expect after the procedure? After the procedure, it is common to have these symptoms at the site where the pacemaker was inserted: Mild pain or soreness. Slight bruising. Some swelling over the incisions. A slight bump over the skin where the device was placed (if it was implanted in the upper chest area). Sometimes, it is possible to feel the device under the skin. This is normal. Follow these instructions at home: Incision care  Keep the incision clean and dry for 2-3 days after the procedure or as told by your health care provider. It takes several weeks for the incision site to completely heal. Do not remove the bandage (dressing) on your chest until told to do so by your health care provider. Leave stitches (sutures), skin glue, or adhesive strips in place. These skin closures may need to stay in place for 2 weeks or longer. If adhesive strip edges start to loosen and curl up, you may trim the loose edges. Do not remove adhesive strips completely unless your health care provider tells you to do that. You may shower in 2 days Pat the incision area dry with a clean towel. Do not rub the area. This may cause bleeding. Check your incision area every day for signs of infection. Check for: More redness, swelling, or pain. Fluid or blood. Warmth. Pus or a bad smell. Avoid putting pressure on the area where the pacemaker was placed. Women may want to place a small pad over the incision site to protect it from their bra strap. Avoid using lotion on skin until insertion site heals completely Medicines Take over-the-counter and prescription medicines only as told by your health care provider. If you were prescribed an  antibiotic medicine, take it as told by your health care provider. Do not stop taking the antibiotic even if you start to feel better. Activity For the first 2 weeks, or as long as told by your health care provider: Avoid lifting anything heavy Avoid exercise or activities that take a lot of effort. If you were given a medicine to help you relax (sedative) during the procedure, it can affect you for several hours. Do not drive or operate machinery until your health care provider says that it is safe. General instructions Do not use any products that contain nicotine or tobacco, such as cigarettes, e-cigarettes, and chewing tobacco. These can delay incision healing after surgery. If you need help quitting, ask your health care provider. Always let all health care providers, including dentists, know about your pacemaker before you have any medical procedures or tests. You may be shown how to transfer data from your pacemaker through the phone to your health care provider. Wear a medical ID bracelet or necklace stating that you have a pacemaker, and carry a pacemaker ID card with you at all times. Avoid close and prolonged exposure to electrical devices that have strong magnetic fields. These include: Insurance claims handler. When at the airport, let officials know that you have a pacemaker. Carry your pacemaker ID card. Metal detectors. If you must pass through a metal detector, walk through it quickly. Do not stop under the detector or stand near it. When using your mobile phone, hold it to the ear opposite the pacemaker. Do not leave  your mobile phone in a pocket over the pacemaker. Your pacemaker battery will last for 5-15 years. Your health care provider will do routine checks to know when the battery is starting to run down. When this happens, the pacemaker will need to be replaced. Keep all follow-up visits as told by your health care provider. This is important. Contact a health care  provider if: You have pain at the incision site that is not relieved by medicines. You have any of these signs of infection: More redness, swelling, or pain around your incision. Fluid or blood coming from your incision. Warmth coming from your incision. Pus or a bad smell coming from your incision. A fever. You feel brief, occasional palpitations, light-headedness, or any symptoms that you think might be related to your heart. Get help right away if: You have chest pain that is different from the pain at the pacemaker site. You develop a red streak that extends above or below the incision site. You have shortness of breath. You have palpitations or an irregular heartbeat. You have light-headedness that does not go away quickly. You faint or have dizzy spells. Your pulse suddenly drops or increases rapidly and does not return to normal. You gain weight and your legs and ankles swell. Summary After the procedure, it is common to have pain, soreness, and some swelling or bruising where the pacemaker was inserted. Keep your incision clean and dry. Follow instructions from your health care provider about how to take care of your incision. Check your incision every day for signs of infection, such as more pain or swelling, pus or a bad smell, warmth, or leaking fluid or blood. Carry a pacemaker ID card with you at all times. This information is not intended to replace advice given to you by your health care provider. Make sure you discuss any questions you have with your health care provider. Document Revised: 11/24/2018 Document Reviewed: 11/24/2018 Elsevier Patient Education  2023 [employer]REsume apixaban 10/6 pm dose

## 2022-10-08 NOTE — Interval H&P Note (Signed)
History and Physical Interval Note:  10/08/2022 8:15 AM  Erin [name]  has presented today for surgery, with the diagnosis of Generator Change Out    Dual   Medtronic   Battery end of life.  The various methods of treatment have been discussed with the patient and family. After consideration of risks, benefits and other options for treatment, the patient has consented to  Procedure(s): PPM GENERATOR CHANGEOUT (N/A) as a surgical intervention.  The patient's history has been reviewed, patient examined, no change in status, stable for surgery.  I have reviewed the patient's chart and labs.  Questions were answered to the patient's satisfaction.     Sherryl Manges  No changes We have reviewed the benefits and risks of generator replacement.  These include but are not limited to lead fracture and infection.  The patient understands, agrees and is willing to proceed.

## 2022-10-21 ENCOUNTER — Ambulatory Visit: Payer: Medicare Other | Attending: Cardiovascular Disease

## 2022-10-21 DIAGNOSIS — I472 Ventricular tachycardia, unspecified: Secondary | ICD-10-CM

## 2022-10-21 DIAGNOSIS — I255 Ischemic cardiomyopathy: Secondary | ICD-10-CM

## 2022-10-21 LAB — CUP PACEART INCLINIC DEVICE CHECK
Date Time Interrogation Session: 20241016165212
Implantable Lead Connection Status: 753985
Implantable Lead Connection Status: 753985
Implantable Lead Implant Date: 20140718
Implantable Lead Implant Date: 20140718
Implantable Lead Location: 753859
Implantable Lead Location: 753860
Implantable Lead Model: 5076
Implantable Lead Model: 6935
Implantable Pulse Generator Implant Date: 20241003

## 2022-10-21 NOTE — Progress Notes (Signed)
Wound check appointment. Steri-strips removed. Wound without redness or edema. Incision edges approximated, wound well healed. Normal device function. Thresholds, sensing, and impedances consistent with implant measurements. Device programmed at chronic outputs post gen change. Histogram distribution appropriate for patient and level of activity. Brief NSVT noted. Patient educated about wound care, shock plan. ROV in 3 months with implanting physician.

## 2022-10-21 NOTE — Patient Instructions (Signed)
? ?  After Your ICD ?(Implantable Cardiac Defibrillator) ? ? ? ?Monitor your defibrillator site for redness, swelling, and drainage. Call the device clinic at [phone] if you experience these symptoms or fever/chills. ? ?Your incision was closed with Dermabond:  You may shower 1 day after your defibrillator implant and wash your incision with soap and water. Avoid lotions, ointments, or perfumes over your incision until it is well-healed. ? ?You may use a hot tub or a pool after your wound check appointment if the incision is completely closed. ? ?Do not lift, push or pull greater than 10 pounds with the affected arm until 6 weeks after your procedure. There are no other restrictions in arm movement after your wound check appointment. ? ?Your ICD is designed to protect you from life threatening heart rhythms. Because of this, you may receive a shock.  ? ?1 shock with no symptoms:  Call the office during business hours. ?1 shock with symptoms (chest pain, chest pressure, dizziness, lightheadedness, shortness of breath, overall feeling unwell):  Call 911. ?If you experience 2 or more shocks in 24 hours:  Call 911. ?If you receive a shock, you should not drive.  ?[place] DMV - no driving for 6 months if you receive appropriate therapy from your ICD.  ? ?ICD Alerts:  Some alerts are vibratory and others beep. These are NOT emergencies. Please call our office to let us know. If this occurs at night or on weekends, it can wait until the next business day. Send a remote transmission. ? ?If your device is capable of reading fluid status (for heart failure), you will be offered monthly monitoring to review this with you.  ? ?Remote monitoring is used to monitor your ICD from home. This monitoring is scheduled every 91 days by our office. It allows [place] to keep an eye on the functioning of your device to ensure it is working properly. You will routinely see your Electrophysiologist annually (more often if necessary).  ?

## 2022-11-09 ENCOUNTER — Telehealth (HOSPITAL_COMMUNITY): Payer: Self-pay

## 2022-11-09 ENCOUNTER — Encounter (HOSPITAL_COMMUNITY): Payer: Self-pay

## 2022-11-09 NOTE — Telephone Encounter (Signed)
Attempted to call patient in regards to Pulmonary Rehab - LM on VM Mailed letter 

## 2022-11-16 ENCOUNTER — Other Ambulatory Visit: Payer: Self-pay | Admitting: Cardiovascular Disease

## 2022-11-16 NOTE — Telephone Encounter (Signed)
last visit 08/06/22 with plan to f/u in  6 months.    next visit:  none/active recall

## 2022-11-23 ENCOUNTER — Telehealth (HOSPITAL_COMMUNITY): Payer: Self-pay

## 2022-11-23 NOTE — Telephone Encounter (Signed)
No response from pt.  Closed referral  

## 2022-12-20 ENCOUNTER — Other Ambulatory Visit: Payer: Self-pay | Admitting: Cardiovascular Disease

## 2023-01-07 ENCOUNTER — Ambulatory Visit (INDEPENDENT_AMBULATORY_CARE_PROVIDER_SITE_OTHER): Payer: Medicare Other

## 2023-01-07 DIAGNOSIS — I472 Ventricular tachycardia, unspecified: Secondary | ICD-10-CM

## 2023-01-07 LAB — CUP PACEART REMOTE DEVICE CHECK
Battery Remaining Longevity: 151 mo
Battery Voltage: 3.1 V
Brady Statistic AP VP Percent: 0.05 %
Brady Statistic AP VS Percent: 0.64 %
Brady Statistic AS VP Percent: 1.12 %
Brady Statistic AS VS Percent: 98.2 %
Brady Statistic RA Percent Paced: 0.8 %
Brady Statistic RV Percent Paced: 1.18 %
Date Time Interrogation Session: 20250101200320
HighPow Impedance: 67 Ohm
Implantable Lead Connection Status: 753985
Implantable Lead Connection Status: 753985
Implantable Lead Implant Date: 20140718
Implantable Lead Implant Date: 20140718
Implantable Lead Location: 753859
Implantable Lead Location: 753860
Implantable Lead Model: 5076
Implantable Lead Model: 6935
Implantable Pulse Generator Implant Date: 20241003
Lead Channel Impedance Value: 1292 Ohm
Lead Channel Impedance Value: 285 Ohm
Lead Channel Impedance Value: 399 Ohm
Lead Channel Pacing Threshold Amplitude: 0.5 V
Lead Channel Pacing Threshold Amplitude: 0.875 V
Lead Channel Pacing Threshold Pulse Width: 0.4 ms
Lead Channel Pacing Threshold Pulse Width: 0.4 ms
Lead Channel Sensing Intrinsic Amplitude: 12.9 mV
Lead Channel Sensing Intrinsic Amplitude: 3.8 mV
Lead Channel Setting Pacing Amplitude: 2 V
Lead Channel Setting Pacing Amplitude: 2 V
Lead Channel Setting Pacing Pulse Width: 0.4 ms
Lead Channel Setting Sensing Sensitivity: 0.3 mV
Zone Setting Status: 755011
Zone Setting Status: 755011

## 2023-01-11 ENCOUNTER — Encounter: Payer: Self-pay | Admitting: Internal Medicine

## 2023-01-11 ENCOUNTER — Ambulatory Visit: Payer: Medicare Other | Admitting: Internal Medicine

## 2023-01-11 ENCOUNTER — Ambulatory Visit: Payer: Medicare Other | Attending: Internal Medicine | Admitting: Internal Medicine

## 2023-01-11 VITALS — BP 94/60 | HR 68 | Ht 62.0 in | Wt 196.6 lb

## 2023-01-11 DIAGNOSIS — I10 Essential (primary) hypertension: Secondary | ICD-10-CM

## 2023-01-11 DIAGNOSIS — Z95810 Presence of automatic (implantable) cardiac defibrillator: Secondary | ICD-10-CM

## 2023-01-11 DIAGNOSIS — Z7901 Long term (current) use of anticoagulants: Secondary | ICD-10-CM

## 2023-01-11 NOTE — Patient Instructions (Addendum)
 Medication Instructions:  Your physician recommends that you continue on your current medications as directed. Please refer to the Current Medication list given to you today.   *If you need a refill on your cardiac medications before your next appointment, please call your pharmacy*   Lab Work: Fasting Lipid Panel, Hepatic Function, TSH, Free T3 and Free T4 -  If you have labs (blood work) drawn today and your tests are completely normal, you will receive your results only by: MyChart Message (if you have MyChart) OR A paper copy in the mail If you have any lab test that is abnormal or we need to change your treatment, we will call you to review the results.   Testing/Procedures: None ordered    Follow-Up: At [facility], you and your health needs are our priority.  As part of our continuing mission to provide you with exceptional heart care, we have created designated Provider Care Teams.  These Care Teams include your primary Cardiologist (physician) and Advanced Practice Providers (APPs -  Physician Assistants and Nurse Practitioners) who all work together to provide you with the care you need, when you need it.  We recommend signing up for the patient portal called MyChart.  Sign up information is provided on this After Visit Summary.  MyChart is used to connect with patients for Virtual Visits (Telemedicine).  Patients are able to view lab/test results, encounter notes, upcoming appointments, etc.  Non-urgent messages can be sent to your provider as well.   To learn more about what you can do with MyChart, go to [email or web address].    Your next appointment:   9 months with Dr Fernande

## 2023-01-11 NOTE — Progress Notes (Signed)
 Patient Care Team: Alec House, MD as PCP - General (Family Medicine) Fernande Elspeth BROCKS, MD as PCP - Electrophysiology (Cardiology) Perla Evalene PARAS, MD as PCP - Cardiology (Cardiology) Donette Ellouise LABOR, FNP as Nurse Practitioner (Cardiology) Fernande Elspeth BROCKS, MD as Consulting Physician (Cardiology) Perla Evalene PARAS, MD as Consulting Physician (Cardiology)   HPI  Erin [name] is a 78 y.o. female seen in followup for Medtronic ICD implanted in the context of a nonischemic and ischemic cardiomyopathy and VT; Hx of afib- persistent. Stenting x2 on Clopidigrel and anticoagulation w apixoban.  Generator replacement 9/24  VT previously managed with amio, but stopped 2/2 hyperthyroidism, Rx w mex alone, then resumed Amio 2/2 increasing VT burden ( discussed with Dr GRAIG -Endo)  TSH noted 12/22 to be high, and methimazole  redosed per SE   The patient denies chest pain, shortness of breath, nocturnal dyspnea, orthopnea or peripheral edema.  There have been no palpitations, lightheadedness or syncope. .   Patient denies symptoms of respiratory, GI intolerance, sun sensitivity, neurological symptoms attributable to amiodarone .       She has had issues with varying thyroid  states, hyper and hypo and further eval >> TRAB elevated and also TRI (inhibiting ABs) Close f/u with Dr DARLETTA             Date Cr K Hgb TSH ALT  3/21    0.02<<0.01   5/21   12.6 (7/21) 2.07 19  12/21 1.17   4.46<< 75   6/22 1.29   6.08   8/22 1.83      9/22 1.6  4.2 6.84 (12/22)<<4.39 16  3/23    14.4 33 (6/23)  10/23    6.84   1/24 1.68  4.2  76  8/24 1.36 4.4 13.1 0.01 42    DATE TEST EF   5/14 Echo  20-25%   8/16 Echo  25 % (44/2.1/35)m LAE  8/18 Echo  20-25%   8/18 Cath  T Cx///90% LAD>>DES  11/18 Echo   25-30%   6/21 PYP  Ratio 1 equivocal  8/23 Echo  35-40%   2/24 Echo  35-40%     PVCs  2.5 /Hr >> 15/hr (6/23)     Thromboembolic risk factors ( age -2 , HTN-1, Vasc disease -1, CHF-1, Gender-1) for  a CHADSVASc Score of >=6  Past Medical History:  Diagnosis Date   Anginal pain (HCC)    Automatic implantable cardioverter-defibrillator in situ    a. s/p MDT ICD 07/2012; b. SN # [id number]    CAD (coronary artery disease)    a. cath 2003: LM nl, mid LAD 50%, LCx nl, RCA nl b. L&RHC 08/17/2014 100% occluded mid LCx, 70% mid LAD with FFR 0.82. Medical therapy. CI 2.1. CO 4.01c. LHC 8/18: CTO LCx w/ R-L colatts, LAD s/p PCI/DES x 2   Chronic systolic CHF (congestive heart failure) (HCC)    a. echo 2014: EF 25%, diffuse HK, LA moderately dilated, RA mildly dilated, PASP 38 mm Hg; b. echo 08/2014: EF 25-30%, diffuse HK, RWMA cannot be excluded, mild MR, mild biatrial enlargement, RV mildly dilated, wall thickness nl, pacer wire/catheter noted, mild to mod TR, PASP high nl, c. TTE 8/18: EF 25-30%, diffuse HK, GR1DD, mod MR, mod dilated LA, nl RV sys fxn, PASP 56   Encounter for long-term (current) use of anticoagulants    Exertional shortness of breath    H/O medication noncompliance    High cholesterol  Hypertension    NICM (nonischemic cardiomyopathy) (HCC)    a. s/p AICD 07/22/2012   Obesity    OSA on CPAP    suppose to; not often (07/22/2012)   Other heavy-for-dates infants    PAF (paroxysmal atrial fibrillation) (HCC)    a. on warfarin--> Eliquis  08/2014    Past Surgical History:  Procedure Laterality Date   ABDOMINAL HYSTERECTOMY  ~ 1998   laparoscopic (07/22/2012)   CARDIAC CATHETERIZATION  ? 1990's   CARDIAC CATHETERIZATION N/A 08/17/2014   Procedure: Right/Left Heart Cath and Coronary Angiography;  Surgeon: Lonni JONETTA Cash, MD;  Location: [facility] INVASIVE CV LAB;  Service: Cardiovascular;  Laterality: N/A;   CARDIAC DEFIBRILLATOR PLACEMENT  07/22/2012   dual-chamber ICD.   CARDIOVERSION N/A 01/13/2019   Procedure: CARDIOVERSION;  Surgeon: Perla Evalene PARAS, MD;  Location: [facility] ORS;  Service: Cardiovascular;  Laterality: N/A;   CORONARY STENT INTERVENTION N/A 08/24/2016    Procedure: CORONARY STENT INTERVENTION;  Surgeon: Darron Deatrice LABOR, MD;  Location: [facility] INVASIVE CV LAB;  Service: Cardiovascular;  Laterality: N/A;   IMPLANTABLE CARDIOVERTER DEFIBRILLATOR IMPLANT N/A 07/22/2012   Procedure: IMPLANTABLE CARDIOVERTER DEFIBRILLATOR IMPLANT;  Surgeon: Danelle LELON Birmingham, MD;  Location: [facility] CATH LAB;  Service: Cardiovascular;  Laterality: N/A;   LEFT HEART CATH AND CORONARY ANGIOGRAPHY N/A 08/24/2016   Procedure: LEFT HEART CATH AND CORONARY ANGIOGRAPHY;  Surgeon: Darron Deatrice LABOR, MD;  Location: [facility] INVASIVE CV LAB;  Service: Cardiovascular;  Laterality: N/A;   PPM GENERATOR CHANGEOUT N/A 10/08/2022   Procedure: PPM GENERATOR CHANGEOUT;  Surgeon: Fernande Elspeth BROCKS, MD;  Location: [facility] INVASIVE CV LAB;  Service: Cardiovascular;  Laterality: N/A;   TEE WITHOUT CARDIOVERSION N/A 08/21/2014   Procedure: TRANSESOPHAGEAL ECHOCARDIOGRAM (TEE);  Surgeon: Annabella Scarce, MD;  Location: [facility] ENDOSCOPY;  Service: Cardiovascular;  Laterality: N/A;   TUBAL LIGATION  ~ 1978    Current Outpatient Medications  Medication Sig Dispense Refill   amiodarone  (PACERONE ) 200 MG tablet Take 0.5 tablets (100 mg total) by mouth daily. 45 tablet 3   apixaban  (ELIQUIS ) 5 MG TABS tablet Take 1 tablet (5 mg total) by mouth 2 (two) times daily. 180 tablet 1   atorvastatin  (LIPITOR) 40 MG tablet TAKE 1 TABLET BY MOUTH DAILY 90 tablet 2   empagliflozin  (JARDIANCE ) 10 MG TABS tablet Take 1 tablet (10 mg total) by mouth daily before breakfast. 30 tablet 5   ezetimibe  (ZETIA ) 10 MG tablet TAKE 1 TABLET BY MOUTH DAILY 90 tablet 0   fluticasone  (FLONASE ) 50 MCG/ACT nasal spray Place 1 spray into both nostrils at bedtime. 16 g 2   levalbuterol  (XOPENEX  HFA) 45 MCG/ACT inhaler Inhale 1-2 puffs into the lungs every 6 (six) hours as needed for wheezing or shortness of breath. 15 g 3   propranolol  ER (INDERAL  LA) 160 MG SR capsule TAKE 1 CAPSULE BY MOUTH DAILY 90 capsule 2   sacubitril -valsartan  (ENTRESTO ) 24-26  MG Take 1 tablet by mouth 2 (two) times daily. 60 tablet 11   spironolactone  (ALDACTONE ) 25 MG tablet TAKE ONE-HALF TABLET BY MOUTH  DAILY 45 tablet 0   torsemide  (DEMADEX ) 20 MG tablet TAKE 2 TABLETS BY MOUTH DAILY 180 tablet 0   No current facility-administered medications for this visit.    No Known Allergies  Review of Systems negative except from HPI and PMH  Physical Exam  BP 94/60   Pulse 68   Ht 5' 2 (1.575 m)   Wt 196 lb 9.6 oz (89.2 kg)   SpO2 97%  BMI 35.96 kg/m   Well developed and well nourished in no acute distress HENT normal Neck supple with JVP-flat Clear Device pocket well healed; without hematoma or erythema.  There is no tethering  Regular rate and rhythm, no  gallop No  murmur Abd-soft with active BS No Clubbing cyanosis  edema Skin-warm and dry A & Oriented  Grossly normal sensory and motor function  ECG sinus @ 68 20/07/38 Lowvolts     Assessment and  Plan Atrial fibrillation-persistent  Nonischemic cardiomyopathy  Coronary artery disease-2 vessel with recent stenting  Ventricular tachycardia-recurrent treated via device  Implantable defibrillator-Medtronic  Treated hyperthyroidism/hypothryoidism HFmrEF boluses only wants to do for him Congestive heart failure-chronic sys  Hypertransaminasemia normalized off amiodarone -- normal on resumed amio   PVCs  Low voltage ECG >> neg PYP   Hypertransaminasemia   Atrial lead failure  No interval ventricular arrhythmias; continue low-dose amiodarone .  Will check surveillance laboratories.  No bleeding on the Eliquis .  Continue 5 mg twice daily dosed for weight and age.  Hemoglobin was in range 8/24  Atrial lead noise with variable impedances and thresholds.  She uses her atrial lead almost never.  Suspect fracture at the time of generator replacement.  Reviewed this with the patient.  Should not affect high-voltage function control.  No symptoms of ischemia.  Continue apixaban  and  Lipitor.  Last LDL was 162

## 2023-01-12 ENCOUNTER — Other Ambulatory Visit: Payer: Self-pay | Admitting: Cardiovascular Disease

## 2023-01-13 ENCOUNTER — Telehealth: Payer: Self-pay | Admitting: Pulmonary Disease

## 2023-01-13 NOTE — Telephone Encounter (Signed)
 They still need the written order/prescription

## 2023-01-13 NOTE — Telephone Encounter (Signed)
 Answering service:  [facility] from [facility] is following up on request for cpap supplies. They still need the written order/prescription

## 2023-01-14 ENCOUNTER — Other Ambulatory Visit (HOSPITAL_BASED_OUTPATIENT_CLINIC_OR_DEPARTMENT_OTHER): Payer: Self-pay

## 2023-01-14 DIAGNOSIS — G4733 Obstructive sleep apnea (adult) (pediatric): Secondary | ICD-10-CM

## 2023-01-15 ENCOUNTER — Telehealth: Payer: Self-pay | Admitting: Pulmonary Disease

## 2023-01-15 NOTE — Telephone Encounter (Signed)
 [facility] needs  a written order for supplies. They already have office notes.   [phone]

## 2023-01-18 NOTE — Telephone Encounter (Signed)
 NFN

## 2023-01-19 ENCOUNTER — Encounter: Payer: Medicare Other | Admitting: Pulmonary Disease

## 2023-01-24 LAB — CUP PACEART INCLINIC DEVICE CHECK
Date Time Interrogation Session: 20250106161812
Implantable Lead Connection Status: 753985
Implantable Lead Connection Status: 753985
Implantable Lead Implant Date: 20140718
Implantable Lead Implant Date: 20140718
Implantable Lead Location: 753859
Implantable Lead Location: 753860
Implantable Lead Model: 5076
Implantable Lead Model: 6935
Implantable Pulse Generator Implant Date: 20241003

## 2023-01-26 ENCOUNTER — Other Ambulatory Visit: Payer: Self-pay | Admitting: Cardiovascular Disease

## 2023-01-26 NOTE — Telephone Encounter (Signed)
Good Morning,   Could you schedule this patient a 6 month follow up appointment? The patient was last seen by R. Dunn on 08-06-2022. Thank you so much.

## 2023-01-26 NOTE — Telephone Encounter (Signed)
 Appointment scheduled for 02/05/23

## 2023-02-03 ENCOUNTER — Encounter: Payer: Self-pay | Admitting: Internal Medicine

## 2023-02-03 LAB — HEPATIC FUNCTION PANEL
ALT: 37 [IU]/L — ABNORMAL HIGH (ref 0–32)
AST: 40 [IU]/L (ref 0–40)
Albumin: 4.3 g/dL (ref 3.8–4.8)
Alkaline Phosphatase: 106 [IU]/L (ref 44–121)
Bilirubin Total: 0.7 mg/dL (ref 0.0–1.2)
Bilirubin, Direct: 0.25 mg/dL (ref 0.00–0.40)
Total Protein: 7.4 g/dL (ref 6.0–8.5)

## 2023-02-03 LAB — LIPID PANEL
Chol/HDL Ratio: 3 {ratio} (ref 0.0–4.4)
Cholesterol, Total: 173 mg/dL (ref 100–199)
HDL: 58 mg/dL (ref >39)
LDL Chol Calc (NIH): 99 mg/dL (ref 0–99)
Triglycerides: 88 mg/dL (ref 0–149)
VLDL Cholesterol Cal: 16 mg/dL (ref 5–40)

## 2023-02-03 LAB — T3, FREE: T3, Free: 2.6 pg/mL (ref 2.0–4.4)

## 2023-02-03 LAB — TSH: TSH: 0.374 u[IU]/mL — ABNORMAL LOW (ref 0.450–4.500)

## 2023-02-03 LAB — T4, FREE: Free T4: 1.46 ng/dL (ref 0.82–1.77)

## 2023-02-04 ENCOUNTER — Other Ambulatory Visit: Payer: Self-pay

## 2023-02-04 DIAGNOSIS — R7989 Other specified abnormal findings of blood chemistry: Secondary | ICD-10-CM

## 2023-02-05 ENCOUNTER — Encounter: Payer: Self-pay | Admitting: Physician Assistant

## 2023-02-05 ENCOUNTER — Ambulatory Visit: Payer: Medicare Other | Attending: Physician Assistant | Admitting: Physician Assistant

## 2023-02-05 VITALS — BP 90/52 | HR 66 | Ht 60.0 in | Wt 196.6 lb

## 2023-02-05 DIAGNOSIS — I255 Ischemic cardiomyopathy: Secondary | ICD-10-CM | POA: Diagnosis not present

## 2023-02-05 DIAGNOSIS — I25118 Atherosclerotic heart disease of native coronary artery with other forms of angina pectoris: Secondary | ICD-10-CM | POA: Diagnosis not present

## 2023-02-05 DIAGNOSIS — I10 Essential (primary) hypertension: Secondary | ICD-10-CM

## 2023-02-05 DIAGNOSIS — I472 Ventricular tachycardia, unspecified: Secondary | ICD-10-CM | POA: Diagnosis not present

## 2023-02-05 DIAGNOSIS — I272 Pulmonary hypertension, unspecified: Secondary | ICD-10-CM

## 2023-02-05 DIAGNOSIS — Z7901 Long term (current) use of anticoagulants: Secondary | ICD-10-CM

## 2023-02-05 DIAGNOSIS — Z79899 Other long term (current) drug therapy: Secondary | ICD-10-CM

## 2023-02-05 DIAGNOSIS — I480 Paroxysmal atrial fibrillation: Secondary | ICD-10-CM

## 2023-02-05 DIAGNOSIS — I5020 Unspecified systolic (congestive) heart failure: Secondary | ICD-10-CM | POA: Diagnosis not present

## 2023-02-05 DIAGNOSIS — E785 Hyperlipidemia, unspecified: Secondary | ICD-10-CM

## 2023-02-05 DIAGNOSIS — R7989 Other specified abnormal findings of blood chemistry: Secondary | ICD-10-CM

## 2023-02-05 DIAGNOSIS — R946 Abnormal results of thyroid function studies: Secondary | ICD-10-CM

## 2023-02-05 DIAGNOSIS — Z95810 Presence of automatic (implantable) cardiac defibrillator: Secondary | ICD-10-CM

## 2023-02-05 NOTE — Progress Notes (Signed)
Cardiology Office Note    Date:  02/05/2023   ID:  Erin [name], DOB [birth year], MRN [id number]  PCP:  Cain Saupe, MD  Cardiologist:  Julien Nordmann, MD  Electrophysiologist:  Sherryl Manges, MD   Chief Complaint: Follow-up  History of Present Illness:   Erin [name] is a 78 y.o. 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   Prior cardiac cath in 2003 showed nonobstructive disease.  Echo in 2014 showed an EF of 25% at which time she underwent ICD implantation.  She was admitted in 2016 with acute on chronic CHF and A-fib with RVR.  She was transferred to [facility] with cardiac cath showing an occluded mid LCx, 70% mid LAD stenosis with an FFR of 0.82.  Echo at that time demonstrated an EF of 25% and enlargement of the left atrium.  Plans were for TEE guided DCCV, though TEE showed a left atrial thrombus with subsequent transition from Coumadin to apixaban.  Following this admission, she was lost to follow-up until she was admitted to the hospital in 08/2016 with an appropriate ICD shock outside the hospital for sustained VT.  She had ran out of medications leading up to presentation.  LHC during that admission showed significant two-vessel CAD with chronically occluded LCx with right-to-left collaterals along with significant progression of mid to proximal diffuse LAD disease.  She underwent successful PCI and 2-overlapping drug-eluting stents to the mid LAD extending  into the proximal segment.  The proximal stent jailed a large first diagonal which had sluggish TIMI II flow.  Echo during that admission showed an EF of 25 to 30% with diffuse hypokinesis, grade 1 diastolic dysfunction, moderate mitral regurgitation, moderately dilated left atrium, and an estimated PASP of 56 mmHg.  PYP scan in 2021 negative.  At her visit with EP in 06/2021, they recommended continuing amiodarone 200 mg daily.  Most recent echo from 08/2021 showed an improvement in her cardiomyopathy with an EF of 35 to 40%, global hypokinesis, grade 2 diastolic dysfunction, normal RV systolic function and ventricular cavity size, PASP 48.4 mmHg, mildly to moderately dilated left atrium, mild mitral regurgitation, mild to moderate tricuspid regurgitation, and an estimated right atrial pressure of 3 mmHg.  I last saw her in 01/2022 at which time she was without symptoms of angina or cardiac decompensation.  She noted improvement in chronic dyspnea when ambulating with a walker.     She was admitted to the hospital in 05/2022 with URI-like symptoms and acute hypoxic respiratory failure requiring supplemental oxygen via nasal cannula and symptomatic improvement with steroids, nebulizers, and IV diuresis.  High-sensitivity troponin negative x 4.  BNP 335.  She diuresed approximately 7 L.     PFTs in 06/2022 showed restrictive lung disease.   She was last seen by general cardiology in 08/2022 and was without symptoms of angina or cardiac decompensation.  She was subsequently resumed on Entresto.  She underwent PPM generator change out on 10/08/2022 with suspected fracture at time of generator  replacement.  She was last seen by EP earlier this month and remained without symptoms of angina or compensation.  The suspected atrial lead fracture was not felt to management moving forward as reports have indicated she does not use her atrial lead often.  EP has maintained her on amiodarone 100 mg daily.  She comes in doing  well from a cardiac perspective and is without symptoms of angina or cardiac decompensation.  No palpitations, dizziness, presyncope, or syncope.  No lower extremity swelling or progressive orthopnea.  No falls or hematochezia or melena.  Not checking her blood pressure at home.  Adherent and tolerating cardiac medications without off target effect.  Overall feels well from a cardiac perspective and does not have any acute cardiac concerns at this time.   Labs independently reviewed: 01/2023 - TSH 0.374, free T3, free T4 normal, albumin 4.3, AST normal, ALT 37, TC 173, TG 88, HDL 58, LDL 99 09/2022 - Hgb 12.9, PLT 247, BUN 20, serum creatinine 1.31, potassium 4.4  Past Medical History:  Diagnosis Date   Anginal pain (HCC)    Automatic implantable cardioverter-defibrillator in situ    a. s/p MDT ICD 07/2012; b. SN # [id number]    CAD (coronary artery disease)    a. cath 2003: LM nl, mid LAD 50%, LCx nl, RCA nl b. L&RHC 08/17/2014 100% occluded mid LCx, 70% mid LAD with FFR 0.82. Medical therapy. CI 2.1. CO 4.01c. LHC 8/18: CTO LCx w/ R-L colatts, LAD s/p PCI/DES x 2   Chronic systolic CHF (congestive heart failure) (HCC)    a. echo 2014: EF 25%, diffuse HK, LA moderately dilated, RA mildly dilated, PASP 38 mm Hg; b. echo 08/2014: EF 25-30%, diffuse HK, RWMA cannot be excluded, mild MR, mild biatrial enlargement, RV mildly dilated, wall thickness nl, pacer wire/catheter noted, mild to mod TR, PASP high nl, c. TTE 8/18: EF 25-30%, diffuse HK, GR1DD, mod MR, mod dilated LA, nl RV sys fxn, PASP 56   Encounter for long-term (current) use of anticoagulants    Exertional shortness of breath    H/O medication noncompliance    High cholesterol    Hypertension    NICM (nonischemic cardiomyopathy) (HCC)    a. s/p AICD 07/22/2012   Obesity    OSA on CPAP    "suppose to; not often" (07/22/2012)   Other "heavy-for-dates" infants    PAF (paroxysmal atrial fibrillation) (HCC)    a. on warfarin--> Eliquis 08/2014     Past Surgical History:  Procedure Laterality Date   ABDOMINAL HYSTERECTOMY  ~ 1998   "laparoscopic" (07/22/2012)   CARDIAC CATHETERIZATION  ? 1990's   CARDIAC CATHETERIZATION N/A 08/17/2014   Procedure: Right/Left Heart Cath and Coronary Angiography;  Surgeon: Kathleene Hazel, MD;  Location: [facility] INVASIVE CV LAB;  Service: Cardiovascular;  Laterality: N/A;   CARDIAC DEFIBRILLATOR PLACEMENT  07/22/2012   dual-chamber ICD.   CARDIOVERSION N/A 01/13/2019   Procedure: CARDIOVERSION;  Surgeon: Antonieta Iba, MD;  Location: [facility] ORS;  Service: Cardiovascular;  Laterality: N/A;   CORONARY STENT INTERVENTION N/A 08/24/2016   Procedure: CORONARY STENT INTERVENTION;  Surgeon: Iran Ouch, MD;  Location: [facility] INVASIVE CV LAB;  Service: Cardiovascular;  Laterality: N/A;   IMPLANTABLE CARDIOVERTER DEFIBRILLATOR IMPLANT N/A 07/22/2012   Procedure: IMPLANTABLE CARDIOVERTER DEFIBRILLATOR IMPLANT;  Surgeon: Marinus Maw, MD;  Location: [facility] CATH LAB;  Service: Cardiovascular;  Laterality: N/A;   LEFT HEART CATH AND CORONARY ANGIOGRAPHY N/A 08/24/2016   Procedure: LEFT HEART CATH  AND CORONARY ANGIOGRAPHY;  Surgeon: Iran Ouch, MD;  Location: [facility] INVASIVE CV LAB;  Service: Cardiovascular;  Laterality: N/A;   PPM GENERATOR CHANGEOUT N/A 10/08/2022   Procedure: PPM GENERATOR CHANGEOUT;  Surgeon: Duke Salvia, MD;  Location: [facility] INVASIVE CV LAB;  Service: Cardiovascular;  Laterality: N/A;   TEE WITHOUT CARDIOVERSION N/A 08/21/2014   Procedure: TRANSESOPHAGEAL ECHOCARDIOGRAM (TEE);  Surgeon: Chilton Si, MD;  Location: [facility] ENDOSCOPY;  Service: Cardiovascular;  Laterality: N/A;   TUBAL LIGATION  ~ 1978    Current Medications: Current Meds  Medication Sig   amiodarone (PACERONE) 200 MG tablet Take 0.5 tablets (100 mg total) by mouth daily.   apixaban (ELIQUIS) 5 MG TABS tablet Take 1 tablet (5 mg total) by mouth 2 (two) times daily.   atorvastatin (LIPITOR) 40 MG tablet TAKE 1 TABLET  BY MOUTH DAILY   empagliflozin (JARDIANCE) 10 MG TABS tablet Take 1 tablet (10 mg total) by mouth daily before breakfast.   ezetimibe (ZETIA) 10 MG tablet TAKE 1 TABLET BY MOUTH DAILY   fluticasone (FLONASE) 50 MCG/ACT nasal spray Place 1 spray into both nostrils at bedtime.   levalbuterol (XOPENEX HFA) 45 MCG/ACT inhaler Inhale 1-2 puffs into the lungs every 6 (six) hours as needed for wheezing or shortness of breath.   propranolol ER (INDERAL LA) 160 MG SR capsule TAKE 1 CAPSULE BY MOUTH DAILY   sacubitril-valsartan (ENTRESTO) 24-26 MG Take 1 tablet by mouth 2 (two) times daily.   spironolactone (ALDACTONE) 25 MG tablet TAKE ONE-HALF TABLET BY MOUTH  DAILY   torsemide (DEMADEX) 20 MG tablet TAKE 2 TABLETS BY MOUTH DAILY    Allergies:   Patient has no known allergies.   Social History   Socioeconomic History   Marital status: Single    Spouse name: Not on file   Number of children: 3   Years of education: Not on file   Highest education level: Not on file  Occupational History   Occupation: Retired    Associate Professor: [employer]  Tobacco Use   Smoking status: Never    Passive exposure: Never   Smokeless tobacco: Never  Vaping Use   Vaping status: Never Used  Substance and Sexual Activity   Alcohol use: No   Drug use: No   Sexual activity: Never  Other Topics Concern   Not on file  Social History Narrative   Not on file   Social Drivers of Health   Financial Resource Strain: Not on file  Food Insecurity: No Food Insecurity (05/21/2022)   Hunger Vital Sign    Worried About Running Out of Food in the Last Year: Never true    Ran Out of Food in the Last Year: Never true  Transportation Needs: No Transportation Needs (05/21/2022)   PRAPARE - Administrator, Civil Service (Medical): No    Lack of Transportation (Non-Medical): No  Physical Activity: Not on file  Stress: Not on file  Social Connections: Not on file     Family History:  The patient's family  history includes Diabetes in her sister; Heart disease in her father and mother; Heart failure in her brother; Lung disease in her sister. There is no history of Thyroid disease.  ROS:   12-point review of systems is negative unless otherwise noted in the HPI.   EKGs/Labs/Other Studies Reviewed:    Studies reviewed were summarized above. The additional studies were reviewed today:  Limited echo 02/13/2022: 1. Left ventricular ejection fraction, by estimation, is 35  to 40%. The  left ventricle has moderately decreased function. The left ventricle  demonstrates global hypokinesis. There is moderate left ventricular  hypertrophy of the apical segment. Left  ventricular diastolic parameters are consistent with Grade I diastolic  dysfunction (impaired relaxation). The average left ventricular global  longitudinal strain is -12.7 %. The global longitudinal strain is  abnormal.   2. Right ventricular systolic function is normal. The right ventricular  size is normal. There is normal pulmonary artery systolic pressure.   3. The mitral valve is normal in structure. Mild mitral valve  regurgitation. No evidence of mitral stenosis.   4. The aortic valve is normal in structure. Aortic valve regurgitation is  not visualized. No aortic stenosis is present.   5. The inferior vena cava is normal in size with greater than 50%  respiratory variability, suggesting right atrial pressure of 3 mmHg.  __________   2D echo 08/14/2021: 1. Left ventricular ejection fraction, by estimation, is 35 to 40%. Left  ventricular ejection fraction by 2D MOD biplane is 38.6 %. The left  ventricle has moderately decreased function. The left ventricle  demonstrates global hypokinesis. Left  ventricular diastolic parameters are consistent with Grade II diastolic  dysfunction (pseudonormalization). The average left ventricular global  longitudinal strain is -13.1 %. The global longitudinal strain is  abnormal.   2. Right  ventricular systolic function is normal. The right ventricular  size is normal. There is moderately elevated pulmonary artery systolic  pressure. The estimated right ventricular systolic pressure is 48.4 mmHg.   3. Left atrial size was mild to moderately dilated.   4. The mitral valve is normal in structure. Mild mitral valve  regurgitation.   5. Tricuspid valve regurgitation is mild to moderate.   6. The aortic valve was not well visualized. Aortic valve regurgitation  is not visualized.   7. The inferior vena cava is normal in size with greater than 50%  respiratory variability, suggesting right atrial pressure of 3 mmHg.  __________   2D echo 11/06/2016: - Left ventricle: The cavity size was mildly dilated. Wall    thickness was normal. Systolic function was severely reduced. The    estimated ejection fraction was in the range of 25% to 30%.    Features are consistent with a pseudonormal left ventricular    filling pattern, with concomitant abnormal relaxation and    increased filling pressure (grade 2 diastolic dysfunction).  - Mitral valve: There was moderate regurgitation.  - Left atrium: The atrium was mildly dilated.  - Tricuspid valve: There was mild regurgitation.  - Pulmonary arteries: Systolic pressure was severely increased. PA    peak pressure: 70 mm Hg (S).  __________   LHC 08/24/2016: Prox RCA lesion, 30 %stenosed. Post Atrio lesion, 25 %stenosed. Mid Cx lesion, 100 %stenosed. 1st Diag lesion, 40 %stenosed. A drug eluting stent was successfully placed. Prox LAD to Mid LAD lesion, 90 %stenosed. Post intervention, there is a 0% residual stenosis. LV end diastolic pressure is moderately elevated.   1. Significant 2 vessel coronary artery disease with chronically occluded left circumflex with right-to-left collaterals. Significant progression of mid to proximal diffuse LAD disease. 2. Moderately elevated left ventricular end-diastolic pressure. 3. Successful  angioplasty and 2 overlapped drug-eluting stent placement to the mid LAD extending into the proximal segment. The proximal stent jailed a large first diagonal which had sluggish TIMI 2 flow. There was no significant ostial stenosis and most likely the sluggish flow was due to embolization.   Recommendations:  the patient had mild chest tightness in the recovery area and EKG showed anterolateral ST depressions suggestive of global ischemia. I suspect that this is likely due to sluggish flow in the large diagonal branch. She was started on nitroglycerin drip. She was given one dose of IV furosemide and Foley catheter was placed. The patient needs to be admitted to the ICU overnight.  She also has productive cough.  Consider treatment with antibiotics. She needs to be in dual antiplatelet therapy for at least one year. __________   Eugenie Birks MPI 08/21/2016: Pharmacological myocardial perfusion imaging study with ISCHEMIA of moderate size, moderate perfusion defect in the mid to apical anterior wall Also with fixed defect in the basal to mid lateral wall. Normal wall motion, EF estimated at 24% No EKG changes concerning for ischemia at peak stress or in recovery. High risk scan Consider cardiac catheterization given known LAD disease by prior cardiac cath __________   2D echo 08/19/2016: - Left ventricle: The cavity size was mildly dilated. Systolic    function was severely reduced. The estimated ejection fraction    was in the range of 25% to 30%. Diffuse hypokinesis. Regional    wall motion abnormalities cannot be excluded. Doppler parameters    are consistent with abnormal left ventricular relaxation (grade 1    diastolic dysfunction).  - Mitral valve: There was moderate regurgitation.  - Left atrium: The atrium was moderately dilated.  - Right ventricle: Pacer wire or catheter noted in right ventricle.    Systolic function was normal.  - Right atrium: The atrium was mildly dilated.  -  Tricuspid valve: There was mild-moderate regurgitation.  - Pulmonary arteries: Systolic pressure was moderately elevated. PA    peak pressure: 56 mm Hg (S).  __________   See CV studies in Epic for more remote studies   EKG:  EKG is ordered today.  The EKG ordered today demonstrates NSR, 66 bpm, low voltage QRS, prior septal infarct, nonspecific ST-T changes, consistent with prior tracing earlier this month  Recent Labs: 05/23/2022: B Natriuretic Peptide 335.8 05/28/2022: Magnesium 2.3 09/17/2022: BUN 20; Creatinine, Ser 1.31; Hemoglobin 12.9; Platelets 247; Potassium 4.4; Sodium 142 02/02/2023: ALT 37; TSH 0.374  Recent Lipid Panel    Component Value Date/Time   CHOL 173 02/02/2023 1302   TRIG 88 02/02/2023 1302   HDL 58 02/02/2023 1302   CHOLHDL 3.0 02/02/2023 1302   CHOLHDL 3.7 01/19/2022 1207   VLDL 20 01/19/2022 1207   LDLCALC 99 02/02/2023 1302   LDLDIRECT 159 (H) 01/19/2022 1207    PHYSICAL EXAM:    VS:  BP (!) 90/52 (BP Location: Left Arm, Patient Position: Sitting)   Pulse 66   Ht 5' (1.524 m)   Wt 196 lb 9.6 oz (89.2 kg)   SpO2 94%   BMI 38.40 kg/m   BMI: Body mass index is 38.4 kg/m.  Physical Exam Constitutional:      Appearance: She is well-developed.  HENT:     Head: Normocephalic and atraumatic.  Eyes:     General:        Right eye: No discharge.        Left eye: No discharge.  Neck:     Comments: Difficult to assess JVD secondary to body habitus. Cardiovascular:     Rate and Rhythm: Normal rate and regular rhythm.     Heart sounds: Normal heart sounds, S1 normal and S2 normal. Heart sounds not distant. No midsystolic click and no opening snap. No murmur heard.  No friction rub.  Pulmonary:     Effort: Pulmonary effort is normal. No respiratory distress.     Breath sounds: Normal breath sounds. No decreased breath sounds, wheezing, rhonchi or rales.  Chest:     Chest wall: No tenderness.  Abdominal:     General: There is no distension.   Musculoskeletal:     Cervical back: Normal range of motion.     Right lower leg: No edema.     Left lower leg: No edema.  Skin:    General: Skin is warm and dry.     Nails: There is no clubbing.  Neurological:     Mental Status: She is alert and oriented to person, place, and time.  Psychiatric:        Speech: Speech normal.        Behavior: Behavior normal.        Thought Content: Thought content normal.        Judgment: Judgment normal.     Wt Readings from Last 3 Encounters:  02/05/23 196 lb 9.6 oz (89.2 kg)  01/11/23 196 lb 9.6 oz (89.2 kg)  10/08/22 196 lb (88.9 kg)     ASSESSMENT & PLAN:   CAD involving the native coronary arteries with stable angina: She is doing well and without symptoms concerning for angina or cardiac decompensation.  She remains on apixaban in place of aspirin given underlying A-fib in an effort to minimize bleeding risk along with atorvastatin, ezetimibe, and propranolol.  No indication for further ischemic testing at this time.  HFrEF secondary to ICM with history of sustained VT status post ICD/pulmonary hypertension: She appears euvolemic and well compensated.  She remains on propranolol per EP due to late acting sodium channel blocking properties, which has been associated with improved control of ventricular arrhythmia, along with Entresto, Jardiance, spironolactone, and torsemide.  Recent labs stable.  Unable to escalate GDMT given relative hypotension, for which she is asymptomatic.  She remains on low-dose amiodarone 100 mg daily per EP with recent LFTs and TSH improving.  Ongoing management per EP.  PAF: Maintaining sinus rhythm on amiodarone and propranolol.  CHA2DS2-VASc at least 6 (CHF, HTN, age x 2, vascular disease, sex category).  Remains on apixaban 5 mg twice daily and does not meet reduced dosing criteria.  Recent labs stable.  Ongoing management per EP.  HTN: Blood pressure is soft today, though she is asymptomatic.  BP is stable when  compared to her last visit.  Continue pharmacotherapy as outlined above.  HLD: LDL 99.  Target LDL less than 70.  Currently on ezetimibe and atorvastatin 40 mg.  If liver function continues to normalize in follow-up, anticipate titration of atorvastatin in an effort to achieve target LDL.  Otherwise, would need to consider bempedoic acid or PCSK9 inhibitor.  Amiodarone induced thyrotoxicosis/transaminitis: Labs obtained earlier this month are improving.  Management per EP and endocrinology.  Chronic dyspnea with restrictive lung disease: She denies any significant dyspnea at this time.  Followed by pulmonology.  CKD stage IIIb: Stable on most recent check.     Disposition: F/u with Dr. Mariah Milling or an APP in 6 months, and EP as directed.    Medication Adjustments/Labs and Tests Ordered: Current medicines are reviewed at length with the patient today.  Concerns regarding medicines are outlined above. Medication changes, Labs and Tests ordered today are summarized above and listed in the Patient Instructions accessible in Encounters.   Elinor Dodge, PA-C 02/05/2023 4:37 PM  [facility] [address] [place], [place] [address] [phone]

## 2023-02-05 NOTE — Patient Instructions (Signed)
Your physician recommends that you continue on your current medications as directed. Please refer to the Current Medication list given to you today. *If you need a refill on your cardiac medications before your next appointment, please call your pharmacy*     Follow-Up: At [facility], you and your health needs are our priority.  As part of our continuing mission to provide you with exceptional heart care, we have created designated Provider Care Teams.  These Care Teams include your primary Cardiologist (physician) and Advanced Practice Providers (APPs -  Physician Assistants and Nurse Practitioners) who all work together to provide you with the care you need, when you need it.  We recommend signing up for the patient portal called "MyChart".  Sign up information is provided on this After Visit Summary.  MyChart is used to connect with patients for Virtual Visits (Telemedicine).  Patients are able to view lab/test results, encounter notes, upcoming appointments, etc.  Non-urgent messages can be sent to your provider as well.   To learn more about what you can do with MyChart, go to [email or web address].    Your next appointment:   6 month(s)  Provider:   You may see Julien Nordmann, MD or one of the following Advanced Practice Providers on your designated Care Team:   Nicolasa Ducking, NP Eula Listen, PA-C Cadence Fransico Michael, PA-C Charlsie Quest, NP Carlos Levering, NP

## 2023-02-11 ENCOUNTER — Ambulatory Visit (INDEPENDENT_AMBULATORY_CARE_PROVIDER_SITE_OTHER): Payer: Medicare Other

## 2023-02-11 DIAGNOSIS — I472 Ventricular tachycardia, unspecified: Secondary | ICD-10-CM

## 2023-02-11 LAB — CUP PACEART REMOTE DEVICE CHECK
Battery Remaining Longevity: 150 mo
Battery Voltage: 3.08 V
Brady Statistic AP VP Percent: 0.06 %
Brady Statistic AP VS Percent: 0.49 %
Brady Statistic AS VP Percent: 1.57 %
Brady Statistic AS VS Percent: 97.88 %
Brady Statistic RA Percent Paced: 0.56 %
Brady Statistic RV Percent Paced: 1.63 %
Date Time Interrogation Session: 20250206033059
HighPow Impedance: 70 Ohm
Implantable Lead Connection Status: 753985
Implantable Lead Connection Status: 753985
Implantable Lead Implant Date: 20140718
Implantable Lead Implant Date: 20140718
Implantable Lead Location: 753859
Implantable Lead Location: 753860
Implantable Lead Model: 5076
Implantable Lead Model: 6935
Implantable Pulse Generator Implant Date: 20241003
Lead Channel Impedance Value: 304 Ohm
Lead Channel Impedance Value: 361 Ohm
Lead Channel Impedance Value: 589 Ohm
Lead Channel Pacing Threshold Amplitude: 0.5 V
Lead Channel Pacing Threshold Amplitude: 1.375 V
Lead Channel Pacing Threshold Pulse Width: 0.4 ms
Lead Channel Pacing Threshold Pulse Width: 0.4 ms
Lead Channel Sensing Intrinsic Amplitude: 12.4 mV
Lead Channel Sensing Intrinsic Amplitude: 3.4 mV
Lead Channel Setting Pacing Amplitude: 2 V
Lead Channel Setting Pacing Amplitude: 2 V
Lead Channel Setting Pacing Pulse Width: 0.4 ms
Lead Channel Setting Sensing Sensitivity: 0.3 mV
Zone Setting Status: 755011
Zone Setting Status: 755011

## 2023-02-17 NOTE — Progress Notes (Signed)
Remote ICD transmission.

## 2023-02-22 ENCOUNTER — Ambulatory Visit: Payer: Medicare Other | Admitting: Internal Medicine

## 2023-02-25 ENCOUNTER — Telehealth: Payer: Medicare Other | Admitting: Internal Medicine

## 2023-02-25 ENCOUNTER — Encounter: Payer: Self-pay | Admitting: Internal Medicine

## 2023-02-25 DIAGNOSIS — E0590 Thyrotoxicosis, unspecified without thyrotoxic crisis or storm: Secondary | ICD-10-CM

## 2023-02-25 DIAGNOSIS — E038 Other specified hypothyroidism: Secondary | ICD-10-CM | POA: Diagnosis not present

## 2023-02-25 NOTE — Patient Instructions (Addendum)
Please have thyroid labs checked in 2 weeks: TSH, free T4 and free T3.   Please continue off Levothyroxine.  If we need to restart Levothyroxine, take the thyroid hormone every day, with water, at least 30 minutes before breakfast, separated by at least 4 hours from: - acid reflux medications - calcium - iron - multivitamins  You should have an endocrinology follow-up appointment in 3 months.

## 2023-02-25 NOTE — Progress Notes (Signed)
Patient ID: Erin [name], female   DOB: [birth year], 78 y.o.   MRN: [id number]  Patient location: Home My location: Office Persons participating in the virtual visit: patient, provider  Referring Provider: Cain Saupe, MD  I connected with the patient on 02/25/23 at 10:20 AM EST by a video enabled telemedicine application and verified that I am speaking with the correct person.   I discussed the limitations of evaluation and management by telemedicine and the availability of in person appointments. The patient expressed understanding and agreed to proceed.   Details of the encounter are shown below.  HPI  Erin [name] is a 78 y.o.-year-old female, returning for follow-up for h/o amiodarone-induced thyrotoxicosis.  She previously saw Dr. Everardo All, but last visit with me 6 months ago.  Interim history:  No fatigue, weight gain, cold intolerance, constipation. Also no tremors, palpitations, heat intolerance.  She continues on amiodarone.  I reviewed patient's chart and also Dr. George Hugh last note: Patient was on amiodarone between 2014-2020.  This was restarted in 2022 at 200 mg daily.  She currently continues on this dose.  TFTs were fluctuating while on amiodarone, starting in 2015.  She was initially on methimazole, then came off and then restarted in 2018 and stopped in 2019.  She started levothyroxine in 2020 but stopped in 12/2018 and started back on methimazole in titration for cardioversion in 01/2019.  Methimazole was again stopped in 09/2019 due to hypothyroidism and restarted in 12/2019.  At last visit with Dr. Everardo All, in 03/2021, she was on methimazole 2.5 mg 3x a week >> this was stopped as a TSH was found to be 14.4.  Latest TSH obtained 3 months later was improved, but still abnormal, at 7.4.  She is on Inderal LA 160 mg daily.  Amiodarone was reduced to 100 mg daily in 02/2022.  We started levothyroxine 03/2022.  We stopped levothyroxine 50 mcg daily in  08/2022.  I reviewed pt's thyroid tests: Lab Results  Component Value Date   TSH 0.374 (L) 02/02/2023   TSH 0.01 Repeated and verified X2. (L) 08/25/2022   TSH 3.91 05/11/2022   TSH 9.190 (H) 03/25/2022   TSH 6.60 (H) 02/24/2022   TSH 6.84 (H) 10/21/2021   TSH 7.400 (H) 07/03/2021   TSH 14.43 (H) 03/12/2021   TSH 6.87 (H) 12/11/2020   TSH 4.390 09/10/2020   FREET4 1.46 02/02/2023   FREET4 2.44 (H) 08/25/2022   FREET4 1.05 05/11/2022   FREET4 0.85 02/24/2022   FREET4 0.95 10/21/2021   FREET4 0.81 03/12/2021   FREET4 0.85 12/11/2020   FREET4 1.21 09/10/2020   FREET4 0.91 11/09/2019   FREET4 0.27 (L) 09/26/2019   T3FREE 2.6 02/02/2023   T3FREE 2.4 02/24/2022   T3FREE 2.7 10/21/2021   T3FREE 3.2 12/11/2020   T3FREE 1.8 (L) 09/10/2020   T3FREE 3.9 01/24/2019   T3FREE 3.9 01/10/2019   T3FREE 3.1 12/23/2018   T3FREE 3.1 08/20/2016   Antithyroid antibodies: Lab Results  Component Value Date   TSI 184 (H) 10/21/2021   Component     Latest Ref Rng 10/21/2021  Thyroperoxidase Ab SerPl-aCnc     <9 IU/mL 1   Thyroglobulin Ab     < or = 1 IU/mL <1   TRAB     <=2.00 IU/L 2.69 (H)    Pt denies: - feeling nodules in neck - hoarseness - dysphagia - choking  Pt does not have a FH of thyroid ds. No FH of thyroid cancer. No h/o radiation tx  to head or neck. No steroid use. No herbal supplements. No Biotin use.  Pt. also has a history of CAD, NICM, sCHF, atrial fibrillation, HTN, history of medication noncompliance.  ROS: + see HPI  Past Medical History:  Diagnosis Date   Anginal pain (HCC)    Automatic implantable cardioverter-defibrillator in situ    a. s/p MDT ICD 07/2012; b. SN # [id number]    CAD (coronary artery disease)    a. cath 2003: LM nl, mid LAD 50%, LCx nl, RCA nl b. L&RHC 08/17/2014 100% occluded mid LCx, 70% mid LAD with FFR 0.82. Medical therapy. CI 2.1. CO 4.01c. LHC 8/18: CTO LCx w/ R-L colatts, LAD s/p PCI/DES x 2   Chronic systolic CHF (congestive  heart failure) (HCC)    a. echo 2014: EF 25%, diffuse HK, LA moderately dilated, RA mildly dilated, PASP 38 mm Hg; b. echo 08/2014: EF 25-30%, diffuse HK, RWMA cannot be excluded, mild MR, mild biatrial enlargement, RV mildly dilated, wall thickness nl, pacer wire/catheter noted, mild to mod TR, PASP high nl, c. TTE 8/18: EF 25-30%, diffuse HK, GR1DD, mod MR, mod dilated LA, nl RV sys fxn, PASP 56   Encounter for long-term (current) use of anticoagulants    Exertional shortness of breath    H/O medication noncompliance    High cholesterol    Hypertension    NICM (nonischemic cardiomyopathy) (HCC)    a. s/p AICD 07/22/2012   Obesity    OSA on CPAP    "suppose to; not often" (07/22/2012)   Other "heavy-for-dates" infants    PAF (paroxysmal atrial fibrillation) (HCC)    a. on warfarin--> Eliquis 08/2014   Past Surgical History:  Procedure Laterality Date   ABDOMINAL HYSTERECTOMY  ~ 1998   "laparoscopic" (07/22/2012)   CARDIAC CATHETERIZATION  ? 1990's   CARDIAC CATHETERIZATION N/A 08/17/2014   Procedure: Right/Left Heart Cath and Coronary Angiography;  Surgeon: Kathleene Hazel, MD;  Location: [facility] INVASIVE CV LAB;  Service: Cardiovascular;  Laterality: N/A;   CARDIAC DEFIBRILLATOR PLACEMENT  07/22/2012   dual-chamber ICD.   CARDIOVERSION N/A 01/13/2019   Procedure: CARDIOVERSION;  Surgeon: Antonieta Iba, MD;  Location: [facility] ORS;  Service: Cardiovascular;  Laterality: N/A;   CORONARY STENT INTERVENTION N/A 08/24/2016   Procedure: CORONARY STENT INTERVENTION;  Surgeon: Iran Ouch, MD;  Location: [facility] INVASIVE CV LAB;  Service: Cardiovascular;  Laterality: N/A;   IMPLANTABLE CARDIOVERTER DEFIBRILLATOR IMPLANT N/A 07/22/2012   Procedure: IMPLANTABLE CARDIOVERTER DEFIBRILLATOR IMPLANT;  Surgeon: Marinus Maw, MD;  Location: [facility] CATH LAB;  Service: Cardiovascular;  Laterality: N/A;   LEFT HEART CATH AND CORONARY ANGIOGRAPHY N/A 08/24/2016   Procedure: LEFT HEART CATH AND CORONARY  ANGIOGRAPHY;  Surgeon: Iran Ouch, MD;  Location: [facility] INVASIVE CV LAB;  Service: Cardiovascular;  Laterality: N/A;   PPM GENERATOR CHANGEOUT N/A 10/08/2022   Procedure: PPM GENERATOR CHANGEOUT;  Surgeon: Duke Salvia, MD;  Location: [facility] INVASIVE CV LAB;  Service: Cardiovascular;  Laterality: N/A;   TEE WITHOUT CARDIOVERSION N/A 08/21/2014   Procedure: TRANSESOPHAGEAL ECHOCARDIOGRAM (TEE);  Surgeon: Chilton Si, MD;  Location: [facility] ENDOSCOPY;  Service: Cardiovascular;  Laterality: N/A;   TUBAL LIGATION  ~ 1978   Social History   Socioeconomic History   Marital status: Single    Spouse name: Not on file   Number of children: 3   Years of education: Not on file   Highest education level: Not on file  Occupational History   Occupation: Retired  Employer: [employer]  Tobacco Use   Smoking status: Never    Passive exposure: Never   Smokeless tobacco: Never  Vaping Use   Vaping status: Never Used  Substance and Sexual Activity   Alcohol use: No   Drug use: No   Sexual activity: Never  Other Topics Concern   Not on file  Social History Narrative   Not on file   Social Drivers of Health   Financial Resource Strain: Not on file  Food Insecurity: No Food Insecurity (05/21/2022)   Hunger Vital Sign    Worried About Running Out of Food in the Last Year: Never true    Ran Out of Food in the Last Year: Never true  Transportation Needs: No Transportation Needs (05/21/2022)   PRAPARE - Administrator, Civil Service (Medical): No    Lack of Transportation (Non-Medical): No  Physical Activity: Not on file  Stress: Not on file  Social Connections: Not on file  Intimate Partner Violence: Not At Risk (05/21/2022)   Humiliation, Afraid, Rape, and Kick questionnaire    Fear of Current or Ex-Partner: No    Emotionally Abused: No    Physically Abused: No    Sexually Abused: No   Current Outpatient Medications on File Prior to Visit  Medication Sig Dispense  Refill   amiodarone (PACERONE) 200 MG tablet Take 0.5 tablets (100 mg total) by mouth daily. 45 tablet 3   apixaban (ELIQUIS) 5 MG TABS tablet Take 1 tablet (5 mg total) by mouth 2 (two) times daily. 180 tablet 1   atorvastatin (LIPITOR) 40 MG tablet TAKE 1 TABLET BY MOUTH DAILY 90 tablet 2   empagliflozin (JARDIANCE) 10 MG TABS tablet Take 1 tablet (10 mg total) by mouth daily before breakfast. 30 tablet 5   ezetimibe (ZETIA) 10 MG tablet TAKE 1 TABLET BY MOUTH DAILY 90 tablet 0   fluticasone (FLONASE) 50 MCG/ACT nasal spray Place 1 spray into both nostrils at bedtime. 16 g 2   levalbuterol (XOPENEX HFA) 45 MCG/ACT inhaler Inhale 1-2 puffs into the lungs every 6 (six) hours as needed for wheezing or shortness of breath. 15 g 3   propranolol ER (INDERAL LA) 160 MG SR capsule TAKE 1 CAPSULE BY MOUTH DAILY 90 capsule 2   sacubitril-valsartan (ENTRESTO) 24-26 MG Take 1 tablet by mouth 2 (two) times daily. 60 tablet 11   spironolactone (ALDACTONE) 25 MG tablet TAKE ONE-HALF TABLET BY MOUTH  DAILY 45 tablet 0   torsemide (DEMADEX) 20 MG tablet TAKE 2 TABLETS BY MOUTH DAILY 180 tablet 0   No current facility-administered medications on file prior to visit.   No Known Allergies Family History  Problem Relation Age of Onset   Heart disease Mother    Heart disease Father    Lung disease Sister    Diabetes Sister    Heart failure Brother    Thyroid disease Neg Hx    PE: There were no vitals taken for this visit. Wt Readings from Last 3 Encounters:  02/05/23 196 lb 9.6 oz (89.2 kg)  01/11/23 196 lb 9.6 oz (89.2 kg)  10/08/22 196 lb (88.9 kg)   Constitutional:  in NAD  The physical exam was not performed (virtual visit).  ASSESSMENT: 1.  History of amiodarone induced thyrotoxicosis - Fluctuating TFTs over the years  2.  Mild Graves' disease  PLAN:  1. Patient with history of widely fluctuating TFTs, previously on both methimazole and levothyroxine.  It was unclear why her TFTs  fluctuated to this extent in the past since it was not completely expected from her diagnosis of amiodarone induced thyrotoxicosis.  It is possible that she had elevated TRAb antibodies with alternating stimulating and suppressing antibodies.  Indeed, her TSI's and TRAb antibodies are positive. -We stopped her methimazole when TSH increased to 14.  TSH improved afterwards to approximately 9 in 03/2022 and at that time I recommended addition of levothyroxine low-dose.  At that time she also had weight gain, fatigue, and cold intolerance.  The symptoms resolved. -she continues on Amiodarone and Inderal -At last check, TSH was suppressed, at 0.01 so we stopped levothyroxine.  3 weeks ago, her TSH was only slightly low: Lab Results  Component Value Date   TSH 0.374 (L) 02/02/2023  - she continues off levothyroxine  -She feels well today, without complaints -At today's visit we discussed about checking her  TFTs again in 2 weeks and see if we need to start thionamides or levothyroxine -We discussed that if we need to restart the thyroid hormone, this is taken: every day, with water, at least 30 minutes before breakfast, separated by at least 4 hours from: - acid reflux medications - calcium - iron - multivitamins - I plan to see her back in 4 months  2.  Mild Graves' disease -With elevated TSI's at last check -No active signs of Graves' ophthalmopathy: No double vision, blurry vision, eye pain, chemosis.  Her vision improved significantly after cataract surgery. -Continue to keep an eye on this.  No intervention is needed for now.  Orders Placed This Encounter  Procedures   TSH   T4, free   T3, free   Carlus Pavlov, MD PhD [facility]

## 2023-03-08 ENCOUNTER — Encounter: Payer: Self-pay | Admitting: Internal Medicine

## 2023-03-18 NOTE — Progress Notes (Signed)
 Remote ICD transmission.

## 2023-03-24 ENCOUNTER — Encounter (HOSPITAL_BASED_OUTPATIENT_CLINIC_OR_DEPARTMENT_OTHER): Payer: Self-pay

## 2023-03-25 ENCOUNTER — Telehealth (HOSPITAL_BASED_OUTPATIENT_CLINIC_OR_DEPARTMENT_OTHER): Payer: Self-pay | Admitting: Pulmonary Disease

## 2023-03-25 NOTE — Telephone Encounter (Signed)
 Okay for patient to be seen in [place]?

## 2023-03-25 NOTE — Telephone Encounter (Signed)
 Called to rescheudle patients appointment due to provider being out on 04/06/23. Patient states [place] location would be a lot easier for her since she has recently moved.   Please advise, patient is scheduled 04/28/23 with Dr. Belia Heman pending approval

## 2023-03-26 ENCOUNTER — Other Ambulatory Visit: Payer: Self-pay | Admitting: Cardiovascular Disease

## 2023-03-29 ENCOUNTER — Telehealth: Payer: Self-pay | Admitting: Physician Assistant

## 2023-03-29 DIAGNOSIS — I480 Paroxysmal atrial fibrillation: Secondary | ICD-10-CM

## 2023-03-29 NOTE — Telephone Encounter (Signed)
*  STAT* If patient is at the pharmacy, call can be transferred to refill team.   1. Which medications need to be refilled? (please list name of each medication and dose if known)   empagliflozin (JARDIANCE) 10 MG TABS tablet   DIFFERENT PHARMACY   4. Which pharmacy/location (including street and city if local pharmacy) is medication to be sent to?  [facility] [address] [place], [place] - [address] Phone: [phone]  Fax: [phone]       5. Do they need a 30 day or 90 day supply? 90

## 2023-03-30 ENCOUNTER — Other Ambulatory Visit: Payer: Self-pay | Admitting: Emergency Medicine

## 2023-03-30 MED ORDER — EMPAGLIFLOZIN 10 MG PO TABS
10.0000 mg | ORAL_TABLET | Freq: Every day | ORAL | 2 refills | Status: AC
Start: 2023-03-30 — End: ?

## 2023-04-02 ENCOUNTER — Other Ambulatory Visit: Payer: Self-pay | Admitting: Cardiovascular Disease

## 2023-04-06 ENCOUNTER — Ambulatory Visit (HOSPITAL_BASED_OUTPATIENT_CLINIC_OR_DEPARTMENT_OTHER): Payer: Medicare Other | Admitting: Pulmonary Disease

## 2023-04-08 ENCOUNTER — Ambulatory Visit (INDEPENDENT_AMBULATORY_CARE_PROVIDER_SITE_OTHER): Payer: Medicare Other

## 2023-04-08 DIAGNOSIS — I472 Ventricular tachycardia, unspecified: Secondary | ICD-10-CM

## 2023-04-08 LAB — CUP PACEART REMOTE DEVICE CHECK
Battery Remaining Longevity: 147 mo
Battery Voltage: 3.05 V
Brady Statistic RV Percent Paced: 0.89 %
Date Time Interrogation Session: 20250403063049
HighPow Impedance: 76 Ohm
Implantable Lead Connection Status: 753985
Implantable Lead Connection Status: 753985
Implantable Lead Implant Date: 20140718
Implantable Lead Implant Date: 20140718
Implantable Lead Location: 753859
Implantable Lead Location: 753860
Implantable Lead Model: 5076
Implantable Lead Model: 6935
Implantable Pulse Generator Implant Date: 20241003
Lead Channel Impedance Value: 323 Ohm
Lead Channel Impedance Value: 380 Ohm
Lead Channel Impedance Value: 475 Ohm
Lead Channel Pacing Threshold Amplitude: 0.625 V
Lead Channel Pacing Threshold Amplitude: 1.125 V
Lead Channel Pacing Threshold Pulse Width: 0.4 ms
Lead Channel Pacing Threshold Pulse Width: 0.4 ms
Lead Channel Sensing Intrinsic Amplitude: 12.1 mV
Lead Channel Sensing Intrinsic Amplitude: 3.4 mV
Lead Channel Setting Pacing Amplitude: 2 V
Lead Channel Setting Pacing Amplitude: 2.25 V
Lead Channel Setting Pacing Pulse Width: 0.4 ms
Lead Channel Setting Sensing Sensitivity: 0.3 mV
Zone Setting Status: 755011
Zone Setting Status: 755011

## 2023-04-14 MED ORDER — APIXABAN 5 MG PO TABS: 5.0000 mg | ORAL_TABLET | Freq: Two times a day (BID) | ORAL | 1 refills | Status: DC

## 2023-04-14 NOTE — Telephone Encounter (Signed)
*  STAT* If patient is at the pharmacy, call can be transferred to refill team.   1. Which medications need to be refilled? (please list name of each medication and dose if known) Eliquis   2. Would you like to learn more about the convenience, safety, & potential cost savings by using the [facility]?    3. Are you open to using the [facility] (Type [facility].    4. Which pharmacy/location (including street and city if local pharmacy) is medication to be sent to? [facility] [address], [place]   5. Do they need a 30 day or 90 day supply? 90 days and refills

## 2023-04-14 NOTE — Telephone Encounter (Signed)
 Prescription refill request for Eliquis received. Indication: Afib  Last office visit: 02/05/23 ([place])  Scr: 1.31 (09/17/22)  Age: 78 Weight: 89.2kg  Appropriate dose. Refill sent.

## 2023-04-14 NOTE — Telephone Encounter (Signed)
 Refill request for Eliquis

## 2023-04-17 ENCOUNTER — Encounter: Payer: Self-pay | Admitting: Internal Medicine

## 2023-04-28 ENCOUNTER — Encounter: Payer: Self-pay | Admitting: Internal Medicine

## 2023-04-28 ENCOUNTER — Ambulatory Visit: Admitting: Internal Medicine

## 2023-04-28 VITALS — BP 130/82 | HR 58 | Temp 98.0°F | Ht 59.0 in | Wt 200.4 lb

## 2023-04-28 DIAGNOSIS — I4819 Other persistent atrial fibrillation: Secondary | ICD-10-CM | POA: Diagnosis not present

## 2023-04-28 DIAGNOSIS — I5022 Chronic systolic (congestive) heart failure: Secondary | ICD-10-CM

## 2023-04-28 DIAGNOSIS — G4733 Obstructive sleep apnea (adult) (pediatric): Secondary | ICD-10-CM

## 2023-04-28 DIAGNOSIS — R0602 Shortness of breath: Secondary | ICD-10-CM | POA: Diagnosis not present

## 2023-04-28 DIAGNOSIS — Z95810 Presence of automatic (implantable) cardiac defibrillator: Secondary | ICD-10-CM

## 2023-04-28 NOTE — Patient Instructions (Addendum)
 Continue Sleep apnea machine as prescribed  Recommend bringing in SD card so that we can download current information  Avoid Allergens and Irritants Avoid secondhand smoke Avoid SICK contacts Recommend  Masking  when appropriate Recommend Keep up-to-date with vaccinations  Follow up with Cardiology

## 2023-04-28 NOTE — Progress Notes (Signed)
 Erin [name], Critical Care, and Sleep Medicine   Past Surgical History:  She  has a past surgical history that includes Cardiac defibrillator placement (07/22/2012); Abdominal hysterectomy (~ 1998); Tubal ligation (~ 1978); Cardiac catheterization (? 1990's); implantable cardioverter defibrillator implant (N/A, 07/22/2012); Cardiac catheterization (N/A, 08/17/2014); TEE without cardioversion (N/A, 08/21/2014); LEFT HEART CATH AND CORONARY ANGIOGRAPHY (N/A, 08/24/2016); CORONARY STENT INTERVENTION (N/A, 08/24/2016); Cardioversion (N/A, 01/13/2019); and PPM GENERATOR CHANGEOUT (N/A, 10/08/2022).  Past Medical History:  CAD, HFrEF, VT s/p AICD, HLD, HTN, Persistent A fib, Amiodarone  induce thyrotoxicosis, Hypothyroidism  Constitutional:  There were no vitals taken for this visit.  Brief Summary:  Erin [name] is a 78 y.o. female with dyspnea and complex sleep apnea.      Subjective:  Follow-up assessment for shortness of breath, multifactorial related to morbid obesity reactive airways disease and chronic systolic heart failure No acute decompensation at this time     Follow-up assessment for OSA Patient uses and benefits from therapy Using CPAP nightly and with naps Pressure setting is comfortable and is sleeping well. Patient states she uses it every day and more than 4 hours a day    She uses Bipap every night.  No issues with mask fit or pressure setting.  Physical Exam:   BP 130/82 (BP Location: Right Arm, Patient Position: Sitting, Cuff Size: Normal)   Pulse (!) 58   Temp 98 F (36.7 C) (Oral)   Ht 4\' 11"  (1.499 m)   Wt 200 lb 6.4 oz (90.9 kg)   SpO2 96%   BMI 40.48 kg/m    Review of Systems: Gen:  Denies  fever, sweats, chills weight loss  HEENT: Denies blurred vision, double vision, ear pain, eye pain, hearing loss, nose bleeds, sore throat Cardiac:  No dizziness, chest pain or heaviness, chest tightness,edema, No JVD Resp:   No cough, -sputum production,  -shortness of breath,-wheezing, -hemoptysis,  Other:  All other systems negative   Physical Examination:   General Appearance: No distress  EYES PERRLA, EOM intact.   NECK Supple, No JVD [name]: normal breath sounds, No wheezing.  CardiovascularNormal S1,S2.  No m/r/g.   Abdomen: Benign, Soft, non-tender. Neurology UE/LE 5/5 strength, no focal deficits Ext pulses intact, cap refill intact ALL OTHER ROS ARE NEGATIVE    [name] testing:  PFT 01/26/13 >> FEV1 1.04 (75%), FEV1% 73, DLCO 50% Echo 02/2022 EF 35 to 40%, global hypokinesis, normal RVSP PFTs 06/08/22>> FVC 1.30 (63%), FEV1 1.05 (69%), ratio 80, DLCO 5.7/37% Mild restriction with severe diffusion defect  PFT 01/26/13 >> FEV1 1.04 (75%), FEV1% 73, DLCO 50%  BiPAP titration study 08/2020 treatment emergent central apneas, 18/13 was an adequate PSG 2006 >> AHI 77  08/2022 HRCT chest no ILD, mild patchy air trapping?  Small airway disease and bronchial wall thickening, no tracheobronchomalacia, 3 mm right middle lobe nodule   Chest Imaging:    Sleep Tests:  PSG 2006 >> AHI 77 Auto Bipap 11/13/21 to 02/10/22 >> used on 90 of 90 nights with average 7 hrs 56 min.  Average AHI 9.4 with median Bipap 15/10 and 95% percentile Bipap 18/13 cm H2O  Cardiac Tests:  Echo 08/14/21 >> EF 35 to 40%, grade 2 DD, RVSP 48.4 mmHg, mild/mod LA dilation, mild MR, mild/mod TR  Social History:  She  reports that she has never smoked. She has never been exposed to tobacco smoke. She has never used smokeless tobacco. She reports that she does not drink alcohol and does not use drugs.  Family History:  Her family history includes Diabetes in her sister; Heart disease in her father and mother; Heart failure in her brother; Lung disease in her sister.    Labs:      Latest Ref Rng & Units 02/02/2023    1:02 PM 09/17/2022   10:53 AM 08/25/2022   12:46 PM  CMP  Glucose 70 - 99 mg/dL  86  92   BUN 8 - 27 mg/dL  20  27   Creatinine 1.61 - 1.00  mg/dL  0.96  0.45   Sodium 409 - 144 mmol/L  142  139   Potassium 3.5 - 5.2 mmol/L  4.4  4.4   Chloride 96 - 106 mmol/L  103  100   CO2 20 - 29 mmol/L  23  25   Calcium  8.7 - 10.3 mg/dL  8.6  9.1   Total Protein 6.0 - 8.5 g/dL 7.4     Total Bilirubin 0.0 - 1.2 mg/dL 0.7     Alkaline Phos 44 - 121 IU/L 106     AST 0 - 40 IU/L 40     ALT 0 - 32 IU/L 37          Latest Ref Rng & Units 09/17/2022   10:53 AM 05/28/2022    5:30 AM 05/27/2022    5:12 AM  CBC  WBC 3.4 - 10.8 x10E3/uL 5.8  11.1  9.9   Hemoglobin 11.1 - 15.9 g/dL 81.1  91.4  78.2   Hematocrit 34.0 - 46.6 % 40.9  41.1  39.6   Platelets 150 - 450 x10E3/uL 247  262  264    Assessment/Plan:   78 year old pleasant African-American female seen today for follow-up assessment for underlying shortness of breath history of congestive heart failure along with severe OSA on BiPAP  Assessment of OSA complex Previous AHI 77 Continue bipap as prescribed  Patient definitely benefits from  therapy as prescribed Pressure setting is comfortable and is sleeping well. continue auto Bipap with max IPAP 18, min EPAP 8, PS 5 cm H2O  No evidence of acute heart failure at this time No respiratory distress No fevers, chills, nausea, vomiting, diarrhea No evidence hemoptysis  Patient Instructions Continue to use CPAP every night, minimum of 4-6 hours a night.  Change equipment every 30 days or as directed by DME.  Wash your tubing with warm soap and water daily, hang to dry. Wash humidifier portion weekly. Use bottled, distilled water and change daily   Be aware of reduced alertness and do not drive or operate heavy machinery if experiencing this or drowsiness.  Exercise encouraged, as tolerated. Encouraged proper weight management.  Important to get eight or more hours of sleep  Limiting the use of the computer and television before bedtime.  Decrease naps during the day, so night time sleep will become enhanced.  Limit caffeine, and  sleep deprivation.  HTN, stroke, uncontrolled diabetes and heart failure are potential risk factors.  Risk of untreated sleep apnea including cardiac arrhthymias, stroke, DM, pulm HTN.   Dyspnea on exertion Multifactorial - most likely related to obesity with deconditioning and congestive heart failure - discussed the importance of maintaining a regular exercise regimen as tolerated   Chronic combined CHF, Persistent Atrial fibrillation, VT s/p AICD. - followed by Dr. Timothy Gollan and Dr. Richardo Chandler with cardiology  Medication List:   Allergies as of 04/28/2023   No Known Allergies      Medication List  Accurate as of April 28, 2023  1:18 PM. If you have any questions, ask your nurse or doctor.          amiodarone  200 MG tablet Commonly known as: PACERONE  Take 0.5 tablets (100 mg total) by mouth daily.   apixaban  5 MG Tabs tablet Commonly known as: Eliquis  Take 1 tablet (5 mg total) by mouth 2 (two) times daily.   atorvastatin  40 MG tablet Commonly known as: LIPITOR TAKE 1 TABLET BY MOUTH DAILY   empagliflozin  10 MG Tabs tablet Commonly known as: Jardiance  Take 1 tablet (10 mg total) by mouth daily before breakfast.   ezetimibe  10 MG tablet Commonly known as: ZETIA  TAKE 1 TABLET BY MOUTH DAILY   fluticasone  50 MCG/ACT nasal spray Commonly known as: FLONASE  Place 1 spray into both nostrils at bedtime.   levalbuterol  45 MCG/ACT inhaler Commonly known as: Xopenex  HFA Inhale 1-2 puffs into the lungs every 6 (six) hours as needed for wheezing or shortness of breath.   propranolol  ER 160 MG SR capsule Commonly known as: INDERAL  LA TAKE 1 CAPSULE BY MOUTH DAILY   sacubitril -valsartan  24-26 MG Commonly known as: ENTRESTO  Take 1 tablet by mouth 2 (two) times daily.   spironolactone  25 MG tablet Commonly known as: ALDACTONE  TAKE ONE-HALF TABLET BY MOUTH  DAILY   torsemide  20 MG tablet Commonly known as: DEMADEX  TAKE 2 TABLETS BY MOUTH DAILY         MEDICATION ADJUSTMENTS/LABS AND TESTS ORDERED: Continue Sleep apnea machine as prescribed Recommend bringing in SD card so that we can download current information Avoid Allergens and Irritants Avoid secondhand smoke Avoid SICK contacts Recommend  Masking  when appropriate Recommend Keep up-to-date with vaccinations Follow up with Cardiology   CURRENT MEDICATIONS REVIEWED AT LENGTH WITH PATIENT TODAY   Patient  satisfied with Plan of action and management. All questions answered   Follow up 6 months   I spent a total of 45 minutes reviewing chart data, face-to-face evaluation with the patient, counseling and coordination of care as detailed above.      Lady Pier, M.D.  [name]  Medical Director [facility] Medical Director [name]

## 2023-05-11 ENCOUNTER — Other Ambulatory Visit (HOSPITAL_COMMUNITY): Payer: Self-pay

## 2023-05-11 ENCOUNTER — Telehealth: Payer: Self-pay | Admitting: Internal Medicine

## 2023-05-11 MED ORDER — AMIODARONE HCL 200 MG PO TABS
100.0000 mg | ORAL_TABLET | Freq: Every day | ORAL | 0 refills | Status: DC
  Filled 2023-05-11: qty 45, 90d supply, fill #0

## 2023-05-11 NOTE — Telephone Encounter (Signed)
Refill sent to the patient's pharmacy  

## 2023-05-11 NOTE — Telephone Encounter (Signed)
*  STAT* If patient is at the pharmacy, call can be transferred to refill team.   1. Which medications need to be refilled? (please list name of each medication and dose if known)   amiodarone (PACERONE) 200 MG tablet   2. Which pharmacy/location (including street and city if local pharmacy) is medication to be sent to?  [facility] [address] [place] - [address]    3. Do they need a 30 day or 90 day supply? 90

## 2023-05-17 ENCOUNTER — Other Ambulatory Visit (HOSPITAL_COMMUNITY): Payer: Self-pay

## 2023-05-17 ENCOUNTER — Other Ambulatory Visit: Payer: Self-pay | Admitting: Cardiovascular Disease

## 2023-05-17 MED ORDER — AMIODARONE HCL 200 MG PO TABS: 100.0000 mg | ORAL_TABLET | Freq: Every day | ORAL | 2 refills | Status: DC

## 2023-05-17 NOTE — Telephone Encounter (Signed)
 RX sent to requested Pharmacy

## 2023-05-17 NOTE — Progress Notes (Signed)
 Remote ICD transmission.

## 2023-05-17 NOTE — Telephone Encounter (Signed)
  Pt called back to follow up refill request. Pt requested to send refill to her pharmacy  [facility] [address] [place], [place] - [address]  [facility] Please resend it to correct pharmacy. Pt is out of medication

## 2023-05-17 NOTE — Addendum Note (Signed)
 Addended by: Debrah Fan, Katoya Amato L on: 05/17/2023 02:34 PM   Modules accepted: Orders

## 2023-06-18 ENCOUNTER — Ambulatory Visit (INDEPENDENT_AMBULATORY_CARE_PROVIDER_SITE_OTHER): Payer: Medicare Other | Admitting: Internal Medicine

## 2023-06-18 ENCOUNTER — Encounter: Payer: Self-pay | Admitting: Internal Medicine

## 2023-06-18 VITALS — BP 118/64 | HR 72 | Ht 59.0 in | Wt 203.0 lb

## 2023-06-18 DIAGNOSIS — T462X5A Adverse effect of other antidysrhythmic drugs, initial encounter: Secondary | ICD-10-CM | POA: Diagnosis not present

## 2023-06-18 DIAGNOSIS — E0580 Other thyrotoxicosis without thyrotoxic crisis or storm: Secondary | ICD-10-CM

## 2023-06-18 DIAGNOSIS — E0500 Thyrotoxicosis with diffuse goiter without thyrotoxic crisis or storm: Secondary | ICD-10-CM | POA: Insufficient documentation

## 2023-06-18 NOTE — Patient Instructions (Signed)
 Please stop at the lab.  Please continue off Levothyroxine .  If we need to restart Levothyroxine , take the thyroid  hormone every day, with water, at least 30 minutes before breakfast, separated by at least 4 hours from: - acid reflux medications - calcium  - iron  - multivitamins  You should have an endocrinology follow-up appointment in 6 months, but likely sooner for labs.

## 2023-06-18 NOTE — Progress Notes (Signed)
 Patient ID: Erin [name], female   DOB: 08-12-1945, 78 y.o.   MRN: [id number]  HPI  Erin [name] is a 78 y.o.-year-old female, returning for follow-up for h/o amiodarone -induced thyrotoxicosis.  She previously saw Dr. Washington Hacker, but last visit with me 4 months ago (virtual).  Interim history:  No fatigue, weight gain, cold intolerance, constipation. Also no tremors, palpitations, heat intolerance.  She continues on amiodarone  100 mg daily.  I reviewed patient's chart and also Dr. Jacqualyn Mates last note: Patient was on amiodarone  between 2014-2020.  This was restarted in 2022 at 200 mg daily.  She currently continues on this dose.  TFTs were fluctuating while on amiodarone , starting in 2015.  She was initially on methimazole , then came off and then restarted in 2018 and stopped in 2019.  She started levothyroxine  in 2020 but stopped in 12/2018 and started back on methimazole  in titration for cardioversion in 01/2019.  Methimazole  was again stopped in 09/2019 due to hypothyroidism and restarted in 12/2019.  At last visit with Dr. Washington Hacker, in 03/2021, she was on methimazole  2.5 mg 3x a week >> this was stopped as a TSH was found to be 14.4.  Latest TSH obtained 3 months later was improved, but still abnormal, at 7.4.  She is on Inderal  LA 160 mg daily.  Amiodarone  was reduced to 100 mg daily in 02/2022.  We started levothyroxine  03/2022.  We stopped levothyroxine  50 mcg daily in 08/2022.  I reviewed pt's thyroid  tests: Lab Results  Component Value Date   TSH 0.374 (L) 02/02/2023   TSH 0.01 Repeated and verified X2. (L) 08/25/2022   TSH 3.91 05/11/2022   TSH 9.190 (H) 03/25/2022   TSH 6.60 (H) 02/24/2022   TSH 6.84 (H) 10/21/2021   TSH 7.400 (H) 07/03/2021   TSH 14.43 (H) 03/12/2021   TSH 6.87 (H) 12/11/2020   TSH 4.390 09/10/2020   FREET4 1.46 02/02/2023   FREET4 2.44 (H) 08/25/2022   FREET4 1.05 05/11/2022   FREET4 0.85 02/24/2022   FREET4 0.95 10/21/2021   FREET4 0.81  03/12/2021   FREET4 0.85 12/11/2020   FREET4 1.21 09/10/2020   FREET4 0.91 11/09/2019   FREET4 0.27 (L) 09/26/2019   T3FREE 2.6 02/02/2023   T3FREE 2.4 02/24/2022   T3FREE 2.7 10/21/2021   T3FREE 3.2 12/11/2020   T3FREE 1.8 (L) 09/10/2020   T3FREE 3.9 01/24/2019   T3FREE 3.9 01/10/2019   T3FREE 3.1 12/23/2018   T3FREE 3.1 08/20/2016   Antithyroid antibodies: Lab Results  Component Value Date   TSI 184 (H) 10/21/2021   Component     Latest Ref Rng 10/21/2021  Thyroperoxidase Ab SerPl-aCnc     <9 IU/mL 1   Thyroglobulin Ab     < or = 1 IU/mL <1   TRAB     <=2.00 IU/L 2.69 (H)    Pt denies: - feeling nodules in neck - hoarseness - dysphagia - choking  Pt does not have a FH of thyroid  ds. No FH of thyroid  cancer. No h/o radiation tx to head or neck. No steroid use. No herbal supplements. No Biotin use.  Pt. also has a history of CAD, NICM, sCHF, atrial fibrillation, HTN, history of medication noncompliance.  ROS: + see HPI  Past Medical History:  Diagnosis Date   Anginal pain (HCC)    Automatic implantable cardioverter-defibrillator in situ    a. s/p MDT ICD 07/2012; b. SN # [id number]    CAD (coronary artery disease)    a. cath 2003:  LM nl, mid LAD 50%, LCx nl, RCA nl b. L&RHC 08/17/2014 100% occluded mid LCx, 70% mid LAD with FFR 0.82. Medical therapy. CI 2.1. CO 4.01c. LHC 8/18: CTO LCx w/ R-L colatts, LAD s/p PCI/DES x 2   Chronic systolic CHF (congestive heart failure) (HCC)    a. echo 2014: EF 25%, diffuse HK, LA moderately dilated, RA mildly dilated, PASP 38 mm Hg; b. echo 08/2014: EF 25-30%, diffuse HK, RWMA cannot be excluded, mild MR, mild biatrial enlargement, RV mildly dilated, wall thickness nl, pacer wire/catheter noted, mild to mod TR, PASP high nl, c. TTE 8/18: EF 25-30%, diffuse HK, GR1DD, mod MR, mod dilated LA, nl RV sys fxn, PASP 56   Encounter for long-term (current) use of anticoagulants    Exertional shortness of breath    H/O medication  noncompliance    High cholesterol    Hypertension    NICM (nonischemic cardiomyopathy) (HCC)    a. s/p AICD 07/22/2012   Obesity    OSA on CPAP    suppose to; not often (07/22/2012)   Other heavy-for-dates infants    PAF (paroxysmal atrial fibrillation) (HCC)    a. on warfarin--> Eliquis  08/2014   Past Surgical History:  Procedure Laterality Date   ABDOMINAL HYSTERECTOMY  ~ 1998   laparoscopic (07/22/2012)   CARDIAC CATHETERIZATION  ? 1990's   CARDIAC CATHETERIZATION N/A 08/17/2014   Procedure: Right/Left Heart Cath and Coronary Angiography;  Surgeon: Odie Benne, MD;  Location: [facility] INVASIVE CV LAB;  Service: Cardiovascular;  Laterality: N/A;   CARDIAC DEFIBRILLATOR PLACEMENT  07/22/2012   dual-chamber ICD.   CARDIOVERSION N/A 01/13/2019   Procedure: CARDIOVERSION;  Surgeon: Devorah Fonder, MD;  Location: [facility] ORS;  Service: Cardiovascular;  Laterality: N/A;   CORONARY STENT INTERVENTION N/A 08/24/2016   Procedure: CORONARY STENT INTERVENTION;  Surgeon: Wenona Ebanks, MD;  Location: [facility] INVASIVE CV LAB;  Service: Cardiovascular;  Laterality: N/A;   IMPLANTABLE CARDIOVERTER DEFIBRILLATOR IMPLANT N/A 07/22/2012   Procedure: IMPLANTABLE CARDIOVERTER DEFIBRILLATOR IMPLANT;  Surgeon: Tammie Fall, MD;  Location[facility] CATH LAB;  Service: Cardiovascular;  Laterality: N/A;   LEFT HEART CATH AND CORONARY ANGIOGRAPHY N/A 08/24/2016   Procedure: LEFT HEART CATH AND CORONARY ANGIOGRAPHY;  Surgeon: Wenona Morath, MD;  Location: [facility] INVASIVE CV LAB;  Service: Cardiovascular;  Laterality: N/A;   PPM GENERATOR CHANGEOUT N/A 10/08/2022   Procedure: PPM GENERATOR CHANGEOUT;  Surgeon: Verona Goodwill, MD;  Location: [facility] INVASIVE CV LAB;  Service: Cardiovascular;  Laterality: N/A;   TEE WITHOUT CARDIOVERSION N/A 08/21/2014   Procedure: TRANSESOPHAGEAL ECHOCARDIOGRAM (TEE);  Surgeon: Maudine Sos, MD;  Location: [facility] ENDOSCOPY;  Service: Cardiovascular;  Laterality: N/A;   TUBAL  LIGATION  ~ 1978   Social History   Socioeconomic History   Marital status: Single    Spouse name: Not on file   Number of children: 3   Years of education: Not on file   Highest education level: Not on file  Occupational History   Occupation: Retired    Associate Professor: [employer]  Tobacco Use   Smoking status: Never    Passive exposure: Never   Smokeless tobacco: Never  Vaping Use   Vaping status: Never Used  Substance and Sexual Activity   Alcohol use: No   Drug use: No   Sexual activity: Never  Other Topics Concern   Not on file  Social History Narrative   Not on file   Social Drivers of Health   Financial Resource Strain:  Not on file  Food Insecurity: No Food Insecurity (05/21/2022)   Hunger Vital Sign    Worried About Running Out of Food in the Last Year: Never true    Ran Out of Food in the Last Year: Never true  Transportation Needs: No Transportation Needs (05/21/2022)   PRAPARE - Administrator, Civil Service (Medical): No    Lack of Transportation (Non-Medical): No  Physical Activity: Not on file  Stress: Not on file  Social Connections: Not on file  Intimate Partner Violence: Not At Risk (05/21/2022)   Humiliation, Afraid, Rape, and Kick questionnaire    Fear of Current or Ex-Partner: No    Emotionally Abused: No    Physically Abused: No    Sexually Abused: No   Current Outpatient Medications on File Prior to Visit  Medication Sig Dispense Refill   amiodarone  (PACERONE ) 200 MG tablet Take 0.5 tablets (100 mg total) by mouth daily. 45 tablet 2   apixaban  (ELIQUIS ) 5 MG TABS tablet Take 1 tablet (5 mg total) by mouth 2 (two) times daily. 180 tablet 1   atorvastatin  (LIPITOR) 40 MG tablet TAKE 1 TABLET BY MOUTH DAILY 100 tablet 2   empagliflozin  (JARDIANCE ) 10 MG TABS tablet Take 1 tablet (10 mg total) by mouth daily before breakfast. 90 tablet 2   ezetimibe  (ZETIA ) 10 MG tablet TAKE 1 TABLET BY MOUTH DAILY 90 tablet 3   fluticasone  (FLONASE ) 50  MCG/ACT nasal spray Place 1 spray into both nostrils at bedtime. 16 g 2   levalbuterol  (XOPENEX  HFA) 45 MCG/ACT inhaler Inhale 1-2 puffs into the lungs every 6 (six) hours as needed for wheezing or shortness of breath. 15 g 3   propranolol  ER (INDERAL  LA) 160 MG SR capsule TAKE 1 CAPSULE BY MOUTH DAILY 100 capsule 2   sacubitril -valsartan  (ENTRESTO ) 24-26 MG Take 1 tablet by mouth 2 (two) times daily. 60 tablet 11   spironolactone  (ALDACTONE ) 25 MG tablet TAKE ONE-HALF TABLET BY MOUTH  DAILY 45 tablet 3   torsemide  (DEMADEX ) 20 MG tablet TAKE 2 TABLETS BY MOUTH DAILY 180 tablet 3   No current facility-administered medications on file prior to visit.   No Known Allergies Family History  Problem Relation Age of Onset   Heart disease Mother    Heart disease Father    Lung disease Sister    Diabetes Sister    Heart failure Brother    Thyroid  disease Neg Hx    PE: BP 118/64   Pulse 72   Ht 4' 11 (1.499 m)   Wt 203 lb (92.1 kg)   SpO2 97%   BMI 41.00 kg/m  Wt Readings from Last 3 Encounters:  06/18/23 203 lb (92.1 kg)  04/28/23 200 lb 6.4 oz (90.9 kg)  02/05/23 196 lb 9.6 oz (89.2 kg)   Constitutional: overweight, in NAD, she walks with a walker Eyes:  EOMI, no exophthalmos ENT: no neck masses, no cervical lymphadenopathy Cardiovascular: RRR, No MRG Respiratory: CTA B Musculoskeletal: no deformities Skin:no rashes Neurological: no tremor with outstretched hands  ASSESSMENT: 1.  History of amiodarone  induced thyrotoxicosis - Fluctuating TFTs over the years  2.  Mild Graves' disease  PLAN:  1. Patient with history of widely fluctuating TFTs previously on both methimazole  and levothyroxine .  It was unclear why her TFTs fluctuated to this extent in the past since it was not completely expected from her diagnosis of amiodarone  induced thyrotoxicosis.  It is possible that she had elevated TRAb antibodies with alternating  stimulating and suppressing antibodies.  Indeed, her TSI's  and TRAb antibodies were positive. - We stopped her methimazole  when TSH increased to 14.  TSH improved afterwards to approximately 9 in 03/2022 and at that time I recommended addition of levothyroxine  low-dose.  She also had weight gain, fatigue, and cold intolerance at the same time.  Her symptoms resolved.  However, afterwards, TSH was suppressed, to 0.01, so we stopped levothyroxine  in 2024.  A TSH was improved at last check: Lab Results  Component Value Date   TSH 0.374 (L) 02/02/2023  -She continues off levothyroxine .  She is on Inderal  and amiodarone  -dose was not changed since last visit. -She feels well today, without complaints -Will recheck her TFTs and see if we need to start levothyroxine  -In that case, I advised her how to take levothyroxine  correctly -I plan to see her back in 6 months, but sooner for labs  2.  Mild Graves' disease - She had mildly elevated TSI's at last check -No active signs of Graves' after myopathy including double vision, blurry vision, eye pain, chemosis.  Her vision improved significantly after cataract surgery - Will continue to keep an eye on this.  No intervention is needed for now  Orders Placed This Encounter  Procedures   TSH   T4, free   T3, free   Emilie Harden, MD PhD [facility]

## 2023-06-19 LAB — TSH: TSH: 6.35 m[IU]/L — ABNORMAL HIGH (ref 0.40–4.50)

## 2023-06-19 LAB — T4, FREE: Free T4: 1.2 ng/dL (ref 0.8–1.8)

## 2023-06-19 LAB — T3, FREE: T3, Free: 2.5 pg/mL (ref 2.3–4.2)

## 2023-06-21 ENCOUNTER — Ambulatory Visit: Payer: Self-pay | Admitting: Internal Medicine

## 2023-06-21 DIAGNOSIS — E0580 Other thyrotoxicosis without thyrotoxic crisis or storm: Secondary | ICD-10-CM

## 2023-07-08 ENCOUNTER — Ambulatory Visit (INDEPENDENT_AMBULATORY_CARE_PROVIDER_SITE_OTHER): Payer: Medicare Other

## 2023-07-08 DIAGNOSIS — I472 Ventricular tachycardia, unspecified: Secondary | ICD-10-CM

## 2023-07-12 ENCOUNTER — Ambulatory Visit: Payer: Self-pay | Admitting: Cardiology

## 2023-07-12 LAB — CUP PACEART REMOTE DEVICE CHECK
Battery Remaining Longevity: 145 mo
Battery Voltage: 3.03 V
Brady Statistic RV Percent Paced: 0.61 %
Date Time Interrogation Session: 20250703080343
HighPow Impedance: 74 Ohm
Implantable Lead Connection Status: 753985
Implantable Lead Connection Status: 753985
Implantable Lead Implant Date: 20140718
Implantable Lead Implant Date: 20140718
Implantable Lead Location: 753859
Implantable Lead Location: 753860
Implantable Lead Model: 5076
Implantable Lead Model: 6935
Implantable Pulse Generator Implant Date: 20241003
Lead Channel Impedance Value: 304 Ohm
Lead Channel Impedance Value: 380 Ohm
Lead Channel Impedance Value: 380 Ohm
Lead Channel Pacing Threshold Amplitude: 0.5 V
Lead Channel Pacing Threshold Amplitude: 0.625 V
Lead Channel Pacing Threshold Pulse Width: 0.4 ms
Lead Channel Pacing Threshold Pulse Width: 0.4 ms
Lead Channel Sensing Intrinsic Amplitude: 13.1 mV
Lead Channel Sensing Intrinsic Amplitude: 3.4 mV
Lead Channel Setting Pacing Amplitude: 1.5 V
Lead Channel Setting Pacing Amplitude: 2 V
Lead Channel Setting Pacing Pulse Width: 0.4 ms
Lead Channel Setting Sensing Sensitivity: 0.3 mV
Zone Setting Status: 755011
Zone Setting Status: 755011

## 2023-07-27 ENCOUNTER — Other Ambulatory Visit

## 2023-08-24 ENCOUNTER — Other Ambulatory Visit: Payer: Self-pay | Admitting: Physician Assistant

## 2023-08-26 ENCOUNTER — Telehealth: Payer: Self-pay | Admitting: Physician Assistant

## 2023-08-26 MED ORDER — SACUBITRIL-VALSARTAN 24-26 MG PO TABS: 1.0000 | ORAL_TABLET | Freq: Two times a day (BID) | ORAL | 1 refills | Status: AC

## 2023-08-26 NOTE — Telephone Encounter (Signed)
*  STAT* If patient is at the pharmacy, call can be transferred to refill team.   1. Which medications need to be refilled? (please list name of each medication and dose if known)   sacubitril -valsartan  (ENTRESTO ) 24-26 MG     2. Would you like to learn more about the convenience, safety, & potential cost savings by using the [facility]? No   3. Are you open to using the [facility] (Type [facility].) No   4. Which pharmacy/location (including street and city if local pharmacy) is medication to be sent to? [facility] [address] [place] - [address]    5. Do they need a 30 day or 90 day supply? 90 day  Pt has scheduled appt on 10/6

## 2023-08-26 NOTE — Telephone Encounter (Signed)
 RX sent in

## 2023-09-30 ENCOUNTER — Encounter: Payer: Self-pay | Admitting: *Deleted

## 2023-10-07 ENCOUNTER — Ambulatory Visit: Payer: Medicare Other

## 2023-10-07 DIAGNOSIS — I472 Ventricular tachycardia, unspecified: Secondary | ICD-10-CM | POA: Diagnosis not present

## 2023-10-08 LAB — CUP PACEART REMOTE DEVICE CHECK
Battery Remaining Longevity: 141 mo
Battery Voltage: 3.02 V
Brady Statistic RV Percent Paced: 0.05 %
Date Time Interrogation Session: 20251002021446
HighPow Impedance: 70 Ohm
Implantable Lead Connection Status: 753985
Implantable Lead Connection Status: 753985
Implantable Lead Implant Date: 20140718
Implantable Lead Implant Date: 20140718
Implantable Lead Location: 753859
Implantable Lead Location: 753860
Implantable Lead Model: 5076
Implantable Lead Model: 6935
Implantable Pulse Generator Implant Date: 20241003
Lead Channel Impedance Value: 285 Ohm
Lead Channel Impedance Value: 342 Ohm
Lead Channel Impedance Value: 361 Ohm
Lead Channel Pacing Threshold Amplitude: 0.5 V
Lead Channel Pacing Threshold Amplitude: 0.625 V
Lead Channel Pacing Threshold Pulse Width: 0.4 ms
Lead Channel Pacing Threshold Pulse Width: 0.4 ms
Lead Channel Sensing Intrinsic Amplitude: 11.4 mV
Lead Channel Sensing Intrinsic Amplitude: 3.3 mV
Lead Channel Setting Pacing Amplitude: 1.5 V
Lead Channel Setting Pacing Amplitude: 2 V
Lead Channel Setting Pacing Pulse Width: 0.4 ms
Lead Channel Setting Sensing Sensitivity: 0.3 mV
Zone Setting Status: 755011
Zone Setting Status: 755011

## 2023-10-08 NOTE — Progress Notes (Signed)
 Remote ICD Transmission

## 2023-10-11 ENCOUNTER — Ambulatory Visit (INDEPENDENT_AMBULATORY_CARE_PROVIDER_SITE_OTHER): Admitting: Cardiology

## 2023-10-11 ENCOUNTER — Encounter: Payer: Self-pay | Admitting: Cardiology

## 2023-10-11 ENCOUNTER — Encounter: Payer: Self-pay | Admitting: Physician Assistant

## 2023-10-11 ENCOUNTER — Ambulatory Visit: Attending: Physician Assistant | Admitting: Physician Assistant

## 2023-10-11 VITALS — BP 100/54 | HR 63 | Ht 59.0 in | Wt 203.0 lb

## 2023-10-11 VITALS — BP 100/54 | HR 66 | Ht 59.0 in | Wt 202.8 lb

## 2023-10-11 DIAGNOSIS — R7989 Other specified abnormal findings of blood chemistry: Secondary | ICD-10-CM

## 2023-10-11 DIAGNOSIS — I5020 Unspecified systolic (congestive) heart failure: Secondary | ICD-10-CM | POA: Diagnosis not present

## 2023-10-11 DIAGNOSIS — R0609 Other forms of dyspnea: Secondary | ICD-10-CM

## 2023-10-11 DIAGNOSIS — I255 Ischemic cardiomyopathy: Secondary | ICD-10-CM

## 2023-10-11 DIAGNOSIS — I10 Essential (primary) hypertension: Secondary | ICD-10-CM

## 2023-10-11 DIAGNOSIS — Z79899 Other long term (current) drug therapy: Secondary | ICD-10-CM

## 2023-10-11 DIAGNOSIS — I472 Ventricular tachycardia, unspecified: Secondary | ICD-10-CM

## 2023-10-11 DIAGNOSIS — R946 Abnormal results of thyroid function studies: Secondary | ICD-10-CM

## 2023-10-11 DIAGNOSIS — Z7901 Long term (current) use of anticoagulants: Secondary | ICD-10-CM

## 2023-10-11 DIAGNOSIS — I480 Paroxysmal atrial fibrillation: Secondary | ICD-10-CM

## 2023-10-11 DIAGNOSIS — I25118 Atherosclerotic heart disease of native coronary artery with other forms of angina pectoris: Secondary | ICD-10-CM | POA: Diagnosis not present

## 2023-10-11 DIAGNOSIS — Z95810 Presence of automatic (implantable) cardiac defibrillator: Secondary | ICD-10-CM | POA: Diagnosis not present

## 2023-10-11 DIAGNOSIS — E785 Hyperlipidemia, unspecified: Secondary | ICD-10-CM

## 2023-10-11 DIAGNOSIS — J984 Other disorders of lung: Secondary | ICD-10-CM

## 2023-10-11 DIAGNOSIS — I272 Pulmonary hypertension, unspecified: Secondary | ICD-10-CM

## 2023-10-11 LAB — CUP PACEART INCLINIC DEVICE CHECK
Date Time Interrogation Session: 20251006154032
Implantable Lead Connection Status: 753985
Implantable Lead Connection Status: 753985
Implantable Lead Implant Date: 20140718
Implantable Lead Implant Date: 20140718
Implantable Lead Location: 753859
Implantable Lead Location: 753860
Implantable Lead Model: 5076
Implantable Lead Model: 6935
Implantable Pulse Generator Implant Date: 20241003

## 2023-10-11 NOTE — Progress Notes (Signed)
 Cardiology Office Note    Date:  10/11/2023   ID:  Erin [name], DOB December 11, 1945, MRN [id number]  PCP:  Alec House, MD  Cardiologist:  Evalene Lunger, MD  Electrophysiologist:  Elspeth Sage, MD   Chief Complaint: Follow up  History of Present Illness:   Erin [name] is a 78 y.o. 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   Prior cardiac cath in 2003 showed nonobstructive disease.  Echo in 2014 showed an EF of 25% at which time she underwent ICD implantation.  She was admitted in 2016 with acute on chronic CHF and A-fib with RVR.  She was transferred to [facility] with cardiac cath showing an occluded mid LCx, 70% mid LAD stenosis with an FFR of 0.82.  Echo at that time demonstrated an EF of 25% and enlargement of the left atrium.  Plans were for TEE guided DCCV, though TEE showed a left atrial thrombus with subsequent transition from Coumadin  to apixaban .  Following this admission, she was lost to follow-up until she was admitted to the hospital in 08/2016 with an appropriate ICD shock outside the hospital for sustained VT.  She had ran out of medications leading up to presentation.  LHC during that admission showed significant two-vessel CAD with chronically occluded LCx with right-to-left collaterals along with significant progression of mid to proximal diffuse LAD disease.  She underwent successful PCI and 2-overlapping drug-eluting stents to the mid LAD extending  into the proximal segment.  The proximal stent jailed a large first diagonal which had sluggish TIMI II flow.  Echo during that admission showed an EF of 25 to 30% with diffuse hypokinesis, grade 1 diastolic dysfunction, moderate mitral regurgitation, moderately dilated left atrium, and an estimated PASP of 56 mmHg.  PYP scan in 2021 negative.  At her visit with EP in 06/2021, they recommended continuing amiodarone  200 mg daily.  Echo from 08/2021 showed an improvement in her cardiomyopathy with an EF of 35 to 40%, global hypokinesis, grade 2 diastolic dysfunction, normal RV systolic function and ventricular cavity size, PASP 48.4 mmHg, mildly to moderately dilated left atrium, mild mitral regurgitation, mild to moderate tricuspid regurgitation, and an estimated right atrial pressure of 3 mmHg.  She was admitted to the hospital in 05/2022 with URI-like symptoms and acute hypoxic respiratory failure requiring supplemental oxygen via nasal cannula and symptomatic improvement with steroids, nebulizers, and IV diuresis.  High-sensitivity troponin negative x 4.  BNP 335.  She diuresed approximately 7 L.  PFTs in 06/2022 showed restrictive lung disease.  She underwent PPM generator change out on 10/08/2022 with suspected fracture at time of generator replacement.  The suspected atrial lead fracture was not felt to affect management moving forward as reports have indicated she does not use her atrial lead often.  EP has maintained her on amiodarone  100 mg daily.  She was last seen by general cardiology in 01/2023 and doing well from a cardiac perspective with relative hypotension precluding escalation of GDMT.  She comes in  today and is doing well from a cardiac perspective, without symptoms of angina or cardiac decompensation.  No palpitations, dizziness, presyncope, or syncope.  No lower extremity swelling or progressive orthopnea.  No falls or symptoms concerning for bleeding.  Not checking blood pressure at home.  Adherent  and tolerating cardiac medications without off target effect.  Was evaluated by EP earlier in the day with follow-up labs pending.  Overall, feels well and does not have any acute cardiac concerns at this time.   Labs independently reviewed: 06/2023 - TSH 6.35, free T4 normal, free T3 normal 01/2023 - albumin 4.3, AST normal, ALT 37, TC 173, TG 88, HDL 58, LDL 99 09/2022 - Hgb 12.9, PLT 247, BUN 20, serum creatinine 1.31, potassium 4.4  Past Medical History:  Diagnosis Date   Anginal pain    Automatic implantable cardioverter-defibrillator in situ    a. s/p MDT ICD 07/2012; b. SN # [id number]    CAD (coronary artery disease)    a. cath 2003: LM nl, mid LAD 50%, LCx nl, RCA nl b. L&RHC 08/17/2014 100% occluded mid LCx, 70% mid LAD with FFR 0.82. Medical therapy. CI 2.1. CO 4.01c. LHC 8/18: CTO LCx w/ R-L colatts, LAD s/p PCI/DES x 2   Chronic systolic CHF (congestive heart failure) (HCC)    a. echo 2014: EF 25%, diffuse HK, LA moderately dilated, RA mildly dilated, PASP 38 mm Hg; b. echo 08/2014: EF 25-30%, diffuse HK, RWMA cannot be excluded, mild MR, mild biatrial enlargement, RV mildly dilated, wall thickness nl, pacer wire/catheter noted, mild to mod TR, PASP high nl, c. TTE 8/18: EF 25-30%, diffuse HK, GR1DD, mod MR, mod dilated LA, nl RV sys fxn, PASP 56   Encounter for long-term (current) use of anticoagulants    Exertional shortness of breath    H/O medication noncompliance    High cholesterol    Hypertension    NICM (nonischemic cardiomyopathy) (HCC)    a. s/p AICD 07/22/2012   Obesity    OSA on CPAP    suppose to; not often (07/22/2012)   Other heavy-for-dates infants    PAF (paroxysmal atrial fibrillation) (HCC)    a. on warfarin--> Eliquis  08/2014    Past Surgical History:  Procedure Laterality Date   ABDOMINAL HYSTERECTOMY  ~ 1998   laparoscopic (07/22/2012)   CARDIAC CATHETERIZATION  ? 1990's   CARDIAC CATHETERIZATION N/A 08/17/2014   Procedure: Right/Left Heart Cath  and Coronary Angiography;  Surgeon: Lonni JONETTA Cash, MD;  Location: [facility] INVASIVE CV LAB;  Service: Cardiovascular;  Laterality: N/A;   CARDIAC DEFIBRILLATOR PLACEMENT  07/22/2012   dual-chamber ICD.   CARDIOVERSION N/A 01/13/2019   Procedure: CARDIOVERSION;  Surgeon: Perla Evalene PARAS, MD;  Location: [facility] ORS;  Service: Cardiovascular;  Laterality: N/A;   CORONARY STENT INTERVENTION N/A 08/24/2016   Procedure: CORONARY STENT INTERVENTION;  Surgeon: Darron Deatrice LABOR, MD;  Location: [facility] INVASIVE CV LAB;  Service: Cardiovascular;  Laterality: N/A;   IMPLANTABLE CARDIOVERTER DEFIBRILLATOR IMPLANT N/A 07/22/2012   Procedure: IMPLANTABLE CARDIOVERTER DEFIBRILLATOR IMPLANT;  Surgeon: Danelle LELON Birmingham, MD;  Location: [facility] CATH LAB;  Service: Cardiovascular;  Laterality: N/A;   LEFT HEART CATH AND CORONARY ANGIOGRAPHY N/A 08/24/2016   Procedure: LEFT HEART CATH AND CORONARY ANGIOGRAPHY;  Surgeon: Darron Deatrice LABOR, MD;  Location: [facility] INVASIVE CV LAB;  Service: Cardiovascular;  Laterality: N/A;   PPM GENERATOR CHANGEOUT N/A 10/08/2022   Procedure: PPM GENERATOR CHANGEOUT;  Surgeon: Fernande Elspeth BROCKS, MD;  Location: [facility] INVASIVE CV LAB;  Service: Cardiovascular;  Laterality: N/A;   TEE WITHOUT CARDIOVERSION N/A 08/21/2014   Procedure: TRANSESOPHAGEAL ECHOCARDIOGRAM (TEE);  Surgeon: Annabella Scarce, MD;  Location: [facility] ENDOSCOPY;  Service: Cardiovascular;  Laterality: N/A;   TUBAL LIGATION  ~ 1978    Current Medications: Current Meds  Medication Sig   amiodarone  (PACERONE ) 200 MG tablet Take 0.5 tablets (100 mg total) by mouth daily.   apixaban  (ELIQUIS ) 5 MG TABS tablet Take 1 tablet (5 mg total) by mouth 2 (two) times daily.   atorvastatin  (LIPITOR) 40 MG tablet TAKE 1 TABLET BY MOUTH DAILY   empagliflozin  (JARDIANCE ) 10 MG TABS tablet Take 1 tablet (10 mg total) by mouth daily before breakfast.   ezetimibe  (ZETIA ) 10 MG tablet TAKE 1 TABLET BY MOUTH DAILY   fluticasone  (FLONASE ) 50 MCG/ACT nasal spray  Place 1 spray into both nostrils at bedtime.   levalbuterol  (XOPENEX  HFA) 45 MCG/ACT inhaler Inhale 1-2 puffs into the lungs every 6 (six) hours as needed for wheezing or shortness of breath.   propranolol  ER (INDERAL  LA) 160 MG SR capsule TAKE 1 CAPSULE BY MOUTH DAILY   sacubitril -valsartan  (ENTRESTO ) 24-26 MG Take 1 tablet by mouth 2 (two) times daily.   spironolactone  (ALDACTONE ) 25 MG tablet TAKE ONE-HALF TABLET BY MOUTH  DAILY   torsemide  (DEMADEX ) 20 MG tablet TAKE 2 TABLETS BY MOUTH DAILY    Allergies:   Patient has no known allergies.   Social History   Socioeconomic History   Marital status: Single    Spouse name: Not on file   Number of children: 3   Years of education: Not on file   Highest education level: Not on file  Occupational History   Occupation: Retired    Associate Professor: [employer]  Tobacco Use   Smoking status: Never    Passive exposure: Never   Smokeless tobacco: Never  Vaping Use   Vaping status: Never Used  Substance and Sexual Activity   Alcohol use: No   Drug use: No   Sexual activity: Never  Other Topics Concern   Not on file  Social History Narrative   Not on file   Social Drivers of Health   Financial Resource Strain: Not on file  Food Insecurity: No Food Insecurity (05/21/2022)   Hunger Vital Sign    Worried About Running Out of Food in the Last Year: Never true    Ran Out of Food in the Last Year: Never true  Transportation Needs: No Transportation Needs (05/21/2022)   PRAPARE - Administrator, Civil Service (Medical): No    Lack of Transportation (Non-Medical): No  Physical Activity: Not on file  Stress: Not on file  Social Connections: Not on file     Family History:  The patient's family history includes Diabetes in her sister; Heart disease in her father and mother; Heart failure in her brother; Lung disease in her sister. There is no history of Thyroid  disease.  ROS:   12-point review of systems is negative unless  otherwise noted in the HPI.   EKGs/Labs/Other Studies Reviewed:    Studies reviewed were summarized above. The additional studies were reviewed today:  Limited echo 02/13/2022: 1. Left ventricular ejection fraction, by estimation, is 35 to 40%. The  left ventricle has moderately decreased function. The left ventricle  demonstrates global hypokinesis. There is moderate left ventricular  hypertrophy of the apical segment. Left  ventricular diastolic parameters are consistent with Grade I diastolic  dysfunction (impaired relaxation). The average left ventricular  global  longitudinal strain is -12.7 %. The global longitudinal strain is  abnormal.   2. Right ventricular systolic function is normal. The right ventricular  size is normal. There is normal pulmonary artery systolic pressure.   3. The mitral valve is normal in structure. Mild mitral valve  regurgitation. No evidence of mitral stenosis.   4. The aortic valve is normal in structure. Aortic valve regurgitation is  not visualized. No aortic stenosis is present.   5. The inferior vena cava is normal in size with greater than 50%  respiratory variability, suggesting right atrial pressure of 3 mmHg.  __________   2D echo 08/14/2021: 1. Left ventricular ejection fraction, by estimation, is 35 to 40%. Left  ventricular ejection fraction by 2D MOD biplane is 38.6 %. The left  ventricle has moderately decreased function. The left ventricle  demonstrates global hypokinesis. Left  ventricular diastolic parameters are consistent with Grade II diastolic  dysfunction (pseudonormalization). The average left ventricular global  longitudinal strain is -13.1 %. The global longitudinal strain is  abnormal.   2. Right ventricular systolic function is normal. The right ventricular  size is normal. There is moderately elevated pulmonary artery systolic  pressure. The estimated right ventricular systolic pressure is 48.4 mmHg.   3. Left atrial size  was mild to moderately dilated.   4. The mitral valve is normal in structure. Mild mitral valve  regurgitation.   5. Tricuspid valve regurgitation is mild to moderate.   6. The aortic valve was not well visualized. Aortic valve regurgitation  is not visualized.   7. The inferior vena cava is normal in size with greater than 50%  respiratory variability, suggesting right atrial pressure of 3 mmHg.  __________   2D echo 11/06/2016: - Left ventricle: The cavity size was mildly dilated. Wall    thickness was normal. Systolic function was severely reduced. The    estimated ejection fraction was in the range of 25% to 30%.    Features are consistent with a pseudonormal left ventricular    filling pattern, with concomitant abnormal relaxation and    increased filling pressure (grade 2 diastolic dysfunction).  - Mitral valve: There was moderate regurgitation.  - Left atrium: The atrium was mildly dilated.  - Tricuspid valve: There was mild regurgitation.  - Pulmonary arteries: Systolic pressure was severely increased. PA    peak pressure: 70 mm Hg (S).  __________   LHC 08/24/2016: Prox RCA lesion, 30 %stenosed. Post Atrio lesion, 25 %stenosed. Mid Cx lesion, 100 %stenosed. 1st Diag lesion, 40 %stenosed. A drug eluting stent was successfully placed. Prox LAD to Mid LAD lesion, 90 %stenosed. Post intervention, there is a 0% residual stenosis. LV end diastolic pressure is moderately elevated.   1. Significant 2 vessel coronary artery disease with chronically occluded left circumflex with right-to-left collaterals. Significant progression of mid to proximal diffuse LAD disease. 2. Moderately elevated left ventricular end-diastolic pressure. 3. Successful angioplasty and 2 overlapped drug-eluting stent placement to the mid LAD extending into the proximal segment. The proximal stent jailed a large first diagonal which had sluggish TIMI 2 flow. There was no significant ostial stenosis and most  likely the sluggish flow was due to embolization.   Recommendations: the patient had mild chest tightness in the recovery area and EKG showed anterolateral ST depressions suggestive of global ischemia. I suspect that this is likely due to sluggish flow in the large diagonal branch. She was started on nitroglycerin  drip. She was given one dose of  IV furosemide  and Foley catheter was placed. The patient needs to be admitted to the ICU overnight.  She also has productive cough.  Consider treatment with antibiotics. She needs to be in dual antiplatelet therapy for at least one year. __________   Lexiscan  MPI 08/21/2016: Pharmacological myocardial perfusion imaging study with ISCHEMIA of moderate size, moderate perfusion defect in the mid to apical anterior wall Also with fixed defect in the basal to mid lateral wall. Normal wall motion, EF estimated at 24% No EKG changes concerning for ischemia at peak stress or in recovery. High risk scan Consider cardiac catheterization given known LAD disease by prior cardiac cath __________   2D echo 08/19/2016: - Left ventricle: The cavity size was mildly dilated. Systolic    function was severely reduced. The estimated ejection fraction    was in the range of 25% to 30%. Diffuse hypokinesis. Regional    wall motion abnormalities cannot be excluded. Doppler parameters    are consistent with abnormal left ventricular relaxation (grade 1    diastolic dysfunction).  - Mitral valve: There was moderate regurgitation.  - Left atrium: The atrium was moderately dilated.  - Right ventricle: Pacer wire or catheter noted in right ventricle.    Systolic function was normal.  - Right atrium: The atrium was mildly dilated.  - Tricuspid valve: There was mild-moderate regurgitation.  - Pulmonary arteries: Systolic pressure was moderately elevated. PA    peak pressure: 56 mm Hg (S).  __________   See CV studies in Epic for more remote studies   EKG:  EKG is not  ordered today.    Recent Labs: 02/02/2023: ALT 37 06/18/2023: TSH 6.35  Recent Lipid Panel    Component Value Date/Time   CHOL 173 02/02/2023 1302   TRIG 88 02/02/2023 1302   HDL 58 02/02/2023 1302   CHOLHDL 3.0 02/02/2023 1302   CHOLHDL 3.7 01/19/2022 1207   VLDL 20 01/19/2022 1207   LDLCALC 99 02/02/2023 1302   LDLDIRECT 159 (H) 01/19/2022 1207    PHYSICAL EXAM:    VS:  BP (!) 100/54 (BP Location: Left Arm, Patient Position: Sitting, Cuff Size: Normal)   Pulse 63   Ht 4' 11 (1.499 m)   Wt 203 lb (92.1 kg)   SpO2 95%   BMI 41.00 kg/m   BMI: Body mass index is 41 kg/m.  Physical Exam Vitals reviewed.  Constitutional:      Appearance: She is well-developed.  HENT:     Head: Normocephalic and atraumatic.  Eyes:     General:        Right eye: No discharge.        Left eye: No discharge.  Neck:     Comments: Difficult to assess JVD secondary to body habitus. Cardiovascular:     Rate and Rhythm: Normal rate and regular rhythm.     Heart sounds: Normal heart sounds, S1 normal and S2 normal. Heart sounds not distant. No midsystolic click and no opening snap. No murmur heard.    No friction rub.  Pulmonary:     Effort: Pulmonary effort is normal. No respiratory distress.     Breath sounds: Normal breath sounds. No decreased breath sounds, wheezing, rhonchi or rales.  Musculoskeletal:     Cervical back: Normal range of motion.     Right lower leg: No edema.     Left lower leg: No edema.  Skin:    General: Skin is warm and dry.     Nails: There is  no clubbing.  Neurological:     Mental Status: She is alert and oriented to person, place, and time.  Psychiatric:        Speech: Speech normal.        Behavior: Behavior normal.        Thought Content: Thought content normal.        Judgment: Judgment normal.     Wt Readings from Last 3 Encounters:  10/11/23 203 lb (92.1 kg)  10/11/23 202 lb 12.8 oz (92 kg)  06/18/23 203 lb (92.1 kg)     ASSESSMENT & PLAN:    CAD involving the native coronary arteries with stable angina: She is doing well and without symptoms concerning for angina or cardiac decompensation. She remains on apixaban  in lieu of aspirin  given underlying A-fib in an effort to minimize bleeding risk along with atorvastatin , ezetimibe , and propranolol . No indication for further ischemic testing at this time.   HFrEF secondary to ICM with history of sustained VT status post ICD/pulmonary hypertension: She is euvolemic and well compensated.  She remains on propranolol  per EP due to late acting sodium channel blocking properties, which has been associated with improved control of ventricular arrhythmia, along with Entresto  24/26 mg twice daily, Jardiance  10 mg, spironolactone  12.5 mg, and torsemide  40 mg daily.  Updated labs pending.  Unable to escalate GDMT at this time given relative hypotension (asymptomatic).  With regards to her VT, she remains on amiodarone  100 mg daily per EP.  PAF: Maintaining sinus rhythm on amiodarone  100 mg and propranolol  160 mg.  CHA2DS2-VASc at least 6 (CHF, HTN, age x 2, vascular disease, sex category).  She remains on apixaban  5 mg twice daily does not meet reduced dosing criteria.  Updated labs pending.  Followed by EP.  HTN: Blood pressure is well-controlled in the office today.  Continue pharmacotherapy as outlined above.  HLD: LDL 99 in 01/2023 with normal AST at that time and ALT 37.  Target LDL less than 70.  Updated LFT pending.  Currently on ezetimibe  10 mg and atorvastatin  40 mg.  If liver function normalizes, anticipate titration of atorvastatin  in an effort to achieve target LDL.  Otherwise, we may need to consider bempedoic acid or PCSK9 inhibitor.  Amiodarone -induced thyrotoxicosis/transaminitis: Updated labs pending.  Management per EP.  Chronic dyspnea with restrictive lung disease: No significant dyspnea at this time.  Followed by pulmonology.  CKD stage IIIb: Updated labs pending.  Stable on most  recent check.     Disposition: F/u with Dr. Gollan or an APP in 6 months, and EP as directed.    Medication Adjustments/Labs and Tests Ordered: Current medicines are reviewed at length with the patient today.  Concerns regarding medicines are outlined above. Medication changes, Labs and Tests ordered today are summarized above and listed in the Patient Instructions accessible in Encounters.   Signed, Bernardino Bring, PA-C 10/11/2023 3:57 PM     [facility] [address] [place], [place] [address] [phone]

## 2023-10-11 NOTE — Progress Notes (Signed)
 Electrophysiology Clinic Note    Date:  10/11/2023  Patient ID:  Erin [name], Erin [name] [birth year], MRN [id number] PCP:  Erin House, MD  Cardiologist:  Erin Lunger, MD  Cardiology APP:  Erin Ellouise LABOR, FNP Erin Bernardino CHRISTELLA, PA-C  Electrophysiologist:  Erin Sage, MD    Discussed the use of AI scribe software for clinical note transcription with the patient, who gave verbal consent to proceed.   Patient Profile    Chief Complaint: VT, AF, amiodarone  follow-up  History of Present Illness: Erin [name] is a 78 y.o. female with PMH notable for ICM/NICM s/p ICD, persis AFib, CAD s/p PCI, hypo and hyperthyroid (amiodarone  induced), HTN, pulmHTN, HLD, OSA; seen today for Erin Sage, MD for routine electrophysiology followup.   She historically was on amiodarone  for VT, was stopped d/t hyperthyroid and was on mexiletine alone. Amio was resumed for increasing VT burden. Patient follows regularly with Dr. Trixie, endocrine.   She last saw Dr. Sage 01/2023 where she was doing well, no med changes.   On follow-up today, she is overall feeling well without chest pain, chest pressure, or palpitations. She usually takes 40mg  torsemide  daily, but recently self-reduced to 20mg  daily because of traveling to visit family. She has since resumed her usual dose. Dry weight is around 200lbs. She denies increased edema or SOB.   Continues to take eliquis  BID without bleeding concerns.      Arrhythmia/Device History MDT dual chamber ICD, imp 2014; dx HFrEF - suspected atrial lead fracture - gen change 2024   AAD -  Amiodarone      ROS:  Please see the history of present illness. All other systems are reviewed and otherwise negative.    Physical Exam    VS:  BP (!) 100/54   Pulse 66   Ht 4' 11 (1.499 m)   Wt 202 lb 12.8 oz (92 kg)   SpO2 94%   BMI 40.96 kg/m  BMI: Body mass index is 40.96 kg/m.           Wt Readings from Last 3 Encounters:  10/11/23 203 lb (92.1 kg)   10/11/23 202 lb 12.8 oz (92 kg)  06/18/23 203 lb (92.1 kg)     GEN- The patient is well appearing, alert and oriented x 3 today.   Lungs- Diminished throughout, normal work of breathing.  Heart- Regular rate and rhythm, no murmurs, rubs or gallops Extremities- No peripheral edema, warm, dry Skin-  device pocket well-healed, no tethering   Device interrogation done today and reviewed by myself:  Battery 11+ years Lead thresholds, impedence, sensing stable  Very brief NSVT episodes Optivol elevating, but below threshold No changes made today   Studies Reviewed   Previous EP, cardiology notes.    EKG is ordered. Personal review of EKG from today shows:    EKG Interpretation Date/Time:  Monday October 11 2023 14:19:59 EDT Ventricular Rate:  66 PR Interval:  202 QRS Duration:  88 QT Interval:  428 QTC Calculation: 448 R Axis:   12  Text Interpretation: Normal sinus rhythm Low voltage QRS Confirmed by Erin [name] [phone]) on 10/11/2023 2:24:48 PM     TTE, 02/13/2022  1. Left ventricular ejection fraction, by estimation, is 35 to 40%. The left ventricle has moderately decreased function. The left ventricle demonstrates global hypokinesis. There is moderate left ventricular hypertrophy of the apical segment. Left ventricular diastolic parameters are consistent with Grade I diastolic dysfunction (impaired relaxation). The average left ventricular global  longitudinal strain is -12.7 %. The global longitudinal strain is abnormal.   2. Right ventricular systolic function is normal. The right ventricular size is normal. There is normal pulmonary artery systolic pressure.   3. The mitral valve is normal in structure. Mild mitral valve regurgitation. No evidence of mitral stenosis.   4. The aortic valve is normal in structure. Aortic valve regurgitation is not visualized. No aortic stenosis is present.   5. The inferior vena cava is normal in size with greater than 50% respiratory  variability, suggesting right atrial pressure of 3 mmHg.   TTE, 08/14/2021  1. Left ventricular ejection fraction, by estimation, is 35 to 40%. Left ventricular ejection fraction by 2D MOD biplane is 38.6 %. The left ventricle has moderately decreased function. The left ventricle demonstrates global hypokinesis. Left ventricular diastolic parameters are consistent with Grade II diastolic dysfunction (pseudonormalization). The average left ventricular global longitudinal strain is -13.1 %. The global longitudinal strain is abnormal.   2. Right ventricular systolic function is normal. The right ventricular size is normal. There is moderately elevated pulmonary artery systolic pressure. The estimated right ventricular systolic pressure is 48.4 mmHg.   3. Left atrial size was mild to moderately dilated. \  4. The mitral valve is normal in structure. Mild mitral valve regurgitation.   5. Tricuspid valve regurgitation is mild to moderate.   6. The aortic valve was not well visualized. Aortic valve regurgitation is not visualized.   7. The inferior vena cava is normal in size with greater than 50% respiratory variability, suggesting right atrial pressure of 3 mmHg.   TTE, 11/06/2016 - Left ventricle: The cavity size was mildly dilated. Wall thickness was normal. Systolic function was severely reduced. The estimated ejection fraction was in the range of 25% to 30%. Features are consistent with a pseudonormal left ventricular filling pattern, with concomitant abnormal relaxation and increased filling pressure (grade 2 diastolic dysfunction).  - Mitral valve: There was moderate regurgitation.  - Left atrium: The atrium was mildly dilated.  - Tricuspid valve: There was mild regurgitation.  - Pulmonary arteries: Systolic pressure was severely increased. PA peak pressure: 70 mm Hg (S).   Assessment and Plan     #) ICM s/p ICD #) VT #) amiodarone  monitoring VT remains quiescent on amiodarone  Very brief  episodes of NSVT, patient asymptomatic.  Continue 100mg  amiodarone  daily Update thyroid  labs (ordered by endo) Update CMP  #) parox AFib No significant AFib burden Continue to monitor via PPM Continue amio as above  #) Hypercoag d/t parox afib CHA2DS2-VASc Score = at least 6 [CHF History: 1, HTN History: 1, Diabetes History: 0, Stroke History: 0, Vascular Disease History: 1, Age Score: 2, Gender Score: 1].  Therefore, the patient's annual risk of stroke is 9.7 %.    Stroke ppx - 5mg  eliquis  BID, appropriately dosed No bleeding concerns Update CBC   #) HFrEF Optivol elevated, likely in setting of reducing torsemide  to 20mg  daily Continue 40mg  daily as prescribed Continue to monitor weight She has follow-up with gen cards later this afternoon       Current medicines are reviewed at length with the patient today.   The patient does not have concerns regarding her medicines.  The following changes were made today:  none  Labs/ tests ordered today include:  Orders Placed This Encounter  Procedures   TSH+T4F+T3Free   Comprehensive metabolic panel with GFR   CBC   EKG 12-Lead     Disposition: Follow up with EP  Team or EP APP in 6 months   Signed, Kairah Leoni, NP  10/11/23  3:34 PM  Electrophysiology [facility]

## 2023-10-11 NOTE — Patient Instructions (Signed)
 Medication Instructions:   Your physician recommends that you continue on your current medications as directed. Please refer to the Current Medication list given to you today.    *If you need a refill on your cardiac medications before your next appointment, please call your pharmacy*  Lab Work:  None ordered at this time   If you have labs (blood work) drawn today and your tests are completely normal, you will receive your results only by:  MyChart Message (if you have MyChart) OR  A paper copy in the mail If you have any lab test that is abnormal or we need to change your treatment, we will call you to review the results.  Testing/Procedures:  None ordered at this time   Referrals:  None ordered at this time   Follow-Up:  At [facility], you and your health needs are our priority.  As part of our continuing mission to provide you with exceptional heart care, our providers are all part of one team.  This team includes your primary Cardiologist (physician) and Advanced Practice Providers or APPs (Physician Assistants and Nurse Practitioners) who all work together to provide you with the care you need, when you need it.  Your next appointment:   6 month(s)  Provider:    Timothy Gollan, MD or Bernardino Bring, PA-C    We recommend signing up for the patient portal called MyChart.  Sign up information is provided on this After Visit Summary.  MyChart is used to connect with patients for Virtual Visits (Telemedicine).  Patients are able to view lab/test results, encounter notes, upcoming appointments, etc.  Non-urgent messages can be sent to your provider as well.   To learn more about what you can do with MyChart, go to [email or web address].

## 2023-10-11 NOTE — Patient Instructions (Signed)
 Medication Instructions:   Your physician recommends that you continue on your current medications as directed. Please refer to the Current Medication list given to you today.    *If you need a refill on your cardiac medications before your next appointment, please call your pharmacy*  Lab Work: Your provider would like for you to have following labs drawn today TSH, T3, T4, CBC and CMP.   If you have labs (blood work) drawn today and your tests are completely normal, you will receive your results only by: MyChart Message (if you have MyChart) OR A paper copy in the mail If you have any lab test that is abnormal or we need to change your treatment, we will call you to review the results.  Testing/Procedures: No test ordered today   Follow-Up: At [facility], you and your health needs are our priority.  As part of our continuing mission to provide you with exceptional heart care, our providers are all part of one team.  This team includes your primary Cardiologist (physician) and Advanced Practice Providers or APPs (Physician Assistants and Nurse Practitioners) who all work together to provide you with the care you need, when you need it.  Your next appointment:   6 month(s)  Provider:   Suzann Riddle, NP    We recommend signing up for the patient portal called MyChart.  Sign up information is provided on this After Visit Summary.  MyChart is used to connect with patients for Virtual Visits (Telemedicine).  Patients are able to view lab/test results, encounter notes, upcoming appointments, etc.  Non-urgent messages can be sent to your provider as well.   To learn more about what you can do with MyChart, go to [email or web address].

## 2023-10-12 ENCOUNTER — Ambulatory Visit: Payer: Self-pay | Admitting: Cardiology

## 2023-10-12 DIAGNOSIS — I10 Essential (primary) hypertension: Secondary | ICD-10-CM

## 2023-10-12 LAB — COMPREHENSIVE METABOLIC PANEL WITH GFR
ALT: 26 IU/L (ref 0–32)
AST: 28 IU/L (ref 0–40)
Albumin: 4 g/dL (ref 3.8–4.8)
Alkaline Phosphatase: 89 IU/L (ref 49–135)
BUN/Creatinine Ratio: 17 (ref 12–28)
BUN: 27 mg/dL (ref 8–27)
Bilirubin Total: 0.8 mg/dL (ref 0.0–1.2)
CO2: 26 mmol/L (ref 20–29)
Calcium: 9.1 mg/dL (ref 8.7–10.3)
Chloride: 101 mmol/L (ref 96–106)
Creatinine, Ser: 1.59 mg/dL — ABNORMAL HIGH (ref 0.57–1.00)
Globulin, Total: 2.8 g/dL (ref 1.5–4.5)
Glucose: 92 mg/dL (ref 70–99)
Potassium: 4.2 mmol/L (ref 3.5–5.2)
Sodium: 140 mmol/L (ref 134–144)
Total Protein: 6.8 g/dL (ref 6.0–8.5)
eGFR: 33 mL/min/1.73 — ABNORMAL LOW (ref >59)

## 2023-10-12 LAB — TSH+T4F+T3FREE
Free T4: 1.15 ng/dL (ref 0.82–1.77)
T3, Free: 2.2 pg/mL (ref 2.0–4.4)
TSH: 5.18 u[IU]/mL — ABNORMAL HIGH (ref 0.450–4.500)

## 2023-10-12 LAB — CBC
Hematocrit: 42 % (ref 34.0–46.6)
Hemoglobin: 13.4 g/dL (ref 11.1–15.9)
MCH: 27.8 pg (ref 26.6–33.0)
MCHC: 31.9 g/dL (ref 31.5–35.7)
MCV: 87 fL (ref 79–97)
Platelets: 221 x10E3/uL (ref 150–450)
RBC: 4.82 x10E6/uL (ref 3.77–5.28)
RDW: 14.2 % (ref 11.7–15.4)
WBC: 6 x10E3/uL (ref 3.4–10.8)

## 2023-10-12 MED ORDER — ATORVASTATIN CALCIUM 80 MG PO TABS: 40.0000 mg | ORAL_TABLET | Freq: Every day | ORAL | 3 refills | Status: DC

## 2023-10-14 NOTE — Progress Notes (Signed)
 Remote ICD Transmission

## 2023-10-15 ENCOUNTER — Other Ambulatory Visit: Payer: Self-pay | Admitting: Cardiovascular Disease

## 2023-10-15 ENCOUNTER — Other Ambulatory Visit: Payer: Self-pay | Admitting: Emergency Medicine

## 2023-10-15 DIAGNOSIS — I480 Paroxysmal atrial fibrillation: Secondary | ICD-10-CM

## 2023-10-15 DIAGNOSIS — I10 Essential (primary) hypertension: Secondary | ICD-10-CM

## 2023-10-15 MED ORDER — ATORVASTATIN CALCIUM 80 MG PO TABS: 80.0000 mg | ORAL_TABLET | Freq: Every day | ORAL | 3 refills | Status: AC

## 2023-10-15 NOTE — Telephone Encounter (Signed)
 Eliquis  5mg  refill request received. Patient is 78 years old, weight-92.1kg, Crea-1.59 on 10/11/23, Diagnosis-Afib, and last seen by Bernardino Bring on 10/11/23. Dose is appropriate based on dosing criteria. Will send in refill to requested pharmacy.

## 2023-10-28 ENCOUNTER — Ambulatory Visit (INDEPENDENT_AMBULATORY_CARE_PROVIDER_SITE_OTHER): Admitting: Nurse Practitioner

## 2023-10-28 ENCOUNTER — Encounter: Payer: Self-pay | Admitting: Nurse Practitioner

## 2023-10-28 ENCOUNTER — Ambulatory Visit: Admitting: Internal Medicine

## 2023-10-28 VITALS — BP 102/62 | HR 70 | Ht 59.0 in | Wt 207.0 lb

## 2023-10-28 DIAGNOSIS — J984 Other disorders of lung: Secondary | ICD-10-CM

## 2023-10-28 DIAGNOSIS — I5022 Chronic systolic (congestive) heart failure: Secondary | ICD-10-CM

## 2023-10-28 DIAGNOSIS — G4739 Other sleep apnea: Secondary | ICD-10-CM

## 2023-10-28 NOTE — Patient Instructions (Addendum)
 Continue to use BiPAP every night, minimum of 4-6 hours a night.  Change equipment as directed. Wash your tubing with warm soap and water daily, hang to dry. Wash humidifier portion weekly. Use bottled, distilled water and change daily Be aware of reduced alertness and do not drive or operate heavy machinery if experiencing this or drowsiness.  Exercise encouraged, as tolerated. Healthy weight management discussed.  Avoid or decrease alcohol consumption and medications that make you more sleepy, if possible. Notify if persistent daytime sleepiness occurs even with consistent use of PAP therapy.  Change CPAP supplies... Every month Mask cushions and/or nasal pillows CPAP machine filters Every 3 months Mask frame (not including the headgear) CPAP tubing Every 6 months Mask headgear Chin strap (if applicable) Humidifier water tub  Continue levalbuterol  1-2 puffs every 6 hours as needed for shortness of breath  Work on graded exercises   Follow up in 1 year with Dr. Isaiah. If symptoms do not improve or worsen, please contact office for sooner follow up or seek emergency care.

## 2023-10-28 NOTE — Assessment & Plan Note (Signed)
 DOE multifactorial related to restrictive lung disease in setting of obesity, CHF, and reactive airway disease. No evidence of ILD on past imaging. Clinically stable. Continue PRN SABA. Action plan in place. Graded exercises encouraged

## 2023-10-28 NOTE — Assessment & Plan Note (Addendum)
 EF 35-40%. Suspect large component of DOE related to CHF. Euvolemic on exam. Follow up with cardiology as scheduled

## 2023-10-28 NOTE — Progress Notes (Signed)
 @Patient  ID: Erin [name], female    DOB: June 11, 1945, 78 y.o.   MRN: [id number]  Chief Complaint  Patient presents with   Sleep Apnea    Referring provider: Alec House, MD  HPI: 78 year old female, never smoker followed for complex OSA on BiPAP and DOE. She is a patient of Dr. Jacqulyn and last seen in office CAD, HFrEF, VT s/p AICD, HLD, HTN, PAF, hypothyroid.   TEST/EVENTS:  2006 PSG: AHI 77/h  06/08/2022 PFT: FVC 63, FEV1 69, ratio 80, DLCO 37 corrects to 69 for alveolar volume 08/2022 HRCT: no ILD  04/28/2023: OV with Dr. Isaiah. SOB related to morbid obesity, reactive airway and CHF. Continue levalbuterol  as needed. OSA on BiPAP. Using nightly. No issues. Encouraged to maintain regular exercise regimen.   10/28/2023: Today - follow up Discussed the use of AI scribe software for clinical note transcription with the patient, who gave verbal consent to proceed.  History of Present Illness Erin [name] is a 78 year old female with obstructive sleep apnea who presents for a routine follow-up.  She has been using her BiPAP machine consistently for years, wearing it every night without any issues. She reports sleeping well at night and maintains satisfactory energy levels during the day. No issues with pressure or mask fit.   No drowsy driving or issues with the CPAP machine.  Her breathing feels stable. No recent issues. Doesn't use the levalbuterol  often. No cough, wheezing, chest congestion, leg swelling.   09/27/2023-10/26/2023: BiPAP auto 18/8, PS 5 Avg IPAP/EPAP 15/10 Avg leak 1.7 AHI 9.3  No Known Allergies  Immunization History  Administered Date(s) Administered   Influenza, Seasonal, Injecte, Preservative Fre 09/15/2022   Influenza,inj,Quad PF,6+ Mos 12/04/2016, 12/07/2017   Influenza,inj,quad, With Preservative 10/05/2017    Past Medical History:  Diagnosis Date   Anginal pain    Automatic implantable cardioverter-defibrillator in situ    a. s/p MDT ICD  07/2012; b. SN # [id number]    CAD (coronary artery disease)    a. cath 2003: LM nl, mid LAD 50%, LCx nl, RCA nl b. L&RHC 08/17/2014 100% occluded mid LCx, 70% mid LAD with FFR 0.82. Medical therapy. CI 2.1. CO 4.01c. LHC 8/18: CTO LCx w/ R-L colatts, LAD s/p PCI/DES x 2   Chronic systolic CHF (congestive heart failure) (HCC)    a. echo 2014: EF 25%, diffuse HK, LA moderately dilated, RA mildly dilated, PASP 38 mm Hg; b. echo 08/2014: EF 25-30%, diffuse HK, RWMA cannot be excluded, mild MR, mild biatrial enlargement, RV mildly dilated, wall thickness nl, pacer wire/catheter noted, mild to mod TR, PASP high nl, c. TTE 8/18: EF 25-30%, diffuse HK, GR1DD, mod MR, mod dilated LA, nl RV sys fxn, PASP 56   Encounter for long-term (current) use of anticoagulants    Exertional shortness of breath    H/O medication noncompliance    High cholesterol    Hypertension    NICM (nonischemic cardiomyopathy) (HCC)    a. s/p AICD 07/22/2012   Obesity    OSA on CPAP    suppose to; not often (07/22/2012)   Other heavy-for-dates infants    PAF (paroxysmal atrial fibrillation) (HCC)    a. on warfarin--> Eliquis  08/2014    Tobacco History: Social History   Tobacco Use  Smoking Status Never   Passive exposure: Never  Smokeless Tobacco Never   Counseling given: Not Answered   Outpatient Medications Prior to Visit  Medication Sig Dispense Refill   amiodarone  (  PACERONE ) 200 MG tablet Take 0.5 tablets (100 mg total) by mouth daily. 45 tablet 2   apixaban  (ELIQUIS ) 5 MG TABS tablet Take 1 tablet by mouth twice daily 180 tablet 1   atorvastatin  (LIPITOR) 80 MG tablet Take 1 tablet (80 mg total) by mouth daily. 90 tablet 3   empagliflozin  (JARDIANCE ) 10 MG TABS tablet Take 1 tablet (10 mg total) by mouth daily before breakfast. 90 tablet 2   ezetimibe  (ZETIA ) 10 MG tablet TAKE 1 TABLET BY MOUTH DAILY 90 tablet 3   fluticasone  (FLONASE ) 50 MCG/ACT nasal spray Place 1 spray into both nostrils at bedtime. 16 g 2    levalbuterol  (XOPENEX  HFA) 45 MCG/ACT inhaler Inhale 1-2 puffs into the lungs every 6 (six) hours as needed for wheezing or shortness of breath. 15 g 3   propranolol  ER (INDERAL  LA) 160 MG SR capsule TAKE 1 CAPSULE BY MOUTH DAILY 100 capsule 2   sacubitril -valsartan  (ENTRESTO ) 24-26 MG Take 1 tablet by mouth 2 (two) times daily. 180 tablet 1   spironolactone  (ALDACTONE ) 25 MG tablet TAKE ONE-HALF TABLET BY MOUTH  DAILY 45 tablet 3   torsemide  (DEMADEX ) 20 MG tablet TAKE 2 TABLETS BY MOUTH DAILY 180 tablet 3   No facility-administered medications prior to visit.     Review of Systems: as above    Physical Exam:  BP 102/62   Pulse 70   Ht 4' 11 (1.499 m)   Wt 207 lb (93.9 kg)   SpO2 96%   BMI 41.81 kg/m   GEN: Pleasant, interactive, well-appearing; morbidly obese; in no acute distress HEENT:  Normocephalic and atraumatic. PERRLA. Sclera white. Nasal turbinates pink, moist and patent bilaterally. No rhinorrhea present. Oropharynx pink and moist, without exudate or edema. No lesions, ulcerations, or postnasal drip. Mallampati IV NECK:  Supple w/ fair ROM. No JVD present.  CV: RRR, no m/r/g, no peripheral edema.  PULMONARY:  Unlabored, regular breathing. Clear bilaterally A&P w/o wheezes/rales/rhonchi. No accessory muscle use.  GI: BS present and normoactive. Soft, non-tender to palpation.  MSK: No erythema, warmth or tenderness.  Neuro: A/Ox3. No focal deficits noted.   Skin: Warm, no lesions or rashe Psych: Normal affect and behavior. Judgement and thought content appropriate.     Lab Results:  CBC    Component Value Date/Time   WBC 6.0 10/11/2023 1537   WBC 11.1 (H) 05/28/2022 0530   RBC 4.82 10/11/2023 1537   RBC 4.83 05/28/2022 0530   HGB 13.4 10/11/2023 1537   HCT 42.0 10/11/2023 1537   PLT 221 10/11/2023 1537   MCV 87 10/11/2023 1537   MCH 27.8 10/11/2023 1537   MCH 27.1 05/28/2022 0530   MCHC 31.9 10/11/2023 1537   MCHC 31.9 05/28/2022 0530   RDW 14.2  10/11/2023 1537   LYMPHSABS 5.1 (H) 05/23/2022 0044   LYMPHSABS 2.8 12/13/2018 1241   MONOABS 2.5 (H) 05/23/2022 0044   EOSABS 0.0 05/23/2022 0044   EOSABS 0.3 12/13/2018 1241   BASOSABS 0.1 05/23/2022 0044   BASOSABS 0.1 12/13/2018 1241    BMET    Component Value Date/Time   NA 140 10/11/2023 1537   K 4.2 10/11/2023 1537   CL 101 10/11/2023 1537   CO2 26 10/11/2023 1537   GLUCOSE 92 10/11/2023 1537   GLUCOSE 103 (H) 05/28/2022 0530   BUN 27 10/11/2023 1537   CREATININE 1.59 (H) 10/11/2023 1537   CALCIUM  9.1 10/11/2023 1537   GFRNONAA 39 (L) 05/28/2022 0530   GFRAA 53 (L) 12/06/2019 1112  BNP    Component Value Date/Time   BNP 335.8 (H) 05/23/2022 0044     Imaging:  CUP PACEART INCLINIC DEVICE CHECK Result Date: 10/11/2023 Normal in-clinic _dual__ chamber ICD check. Presenting Rhythm: _AS-VS__ . Routine testing was performed. Thresholds, sensing, and impedance demonstrate stable parameters and no programming changes needed. No treated arrhythmias. Estimated longevity __11 years__ . Pt enrolled in remote follow-up. Erin Needle, NP  CUP PACEART REMOTE DEVICE CHECK Result Date: 10/08/2023 ICD Scheduled remote reviewed. Normal device function.  Presenting rhythm:  AS/VS 31 NSVT events, available EGM's 6-8 beats in duration, HR's 140-161 Next remote transmission per protocol. LA, CVRS   Administration History     None          Latest Ref Rng & Units 06/08/2022    2:54 PM 01/26/2013   11:55 AM  PFT Results  FVC-Pre L 1.27  1.42   FVC-Predicted Pre % 62  79   FVC-Post L 1.30  1.38   FVC-Predicted Post % 63  76   Pre FEV1/FVC % % 80  73   Post FEV1/FCV % % 80  73   FEV1-Pre L 1.01  1.04   FEV1-Predicted Pre % 67  75   FEV1-Post L 1.05  1.01   DLCO uncorrected ml/min/mmHg 5.69  8.22   DLCO UNC% % 37  50   DLCO corrected ml/min/mmHg 5.74    DLCO COR %Predicted % 37    DLVA Predicted % 69  92   TLC L 3.10  2.71   TLC % Predicted % 74  65   RV % Predicted % 79   69     No results found for: NITRICOXIDE      Assessment & Plan:   Complex sleep apnea syndrome Complex sleep apnea, managed with BiPAP. Excellent compliance. Good control with residual events <15/h, average of 9/h, which appears consistent for her when reviewing prior downloads. Receives benefit from use so will not make any further adjustments today. Aware of risks of untreated OSA and proper care/use of device. Healthy weight loss encouraged. Safe driving practices reviewed.  Patient Instructions  Continue to use BiPAP every night, minimum of 4-6 hours a night.  Change equipment as directed. Wash your tubing with warm soap and water daily, hang to dry. Wash humidifier portion weekly. Use bottled, distilled water and change daily Be aware of reduced alertness and do not drive or operate heavy machinery if experiencing this or drowsiness.  Exercise encouraged, as tolerated. Healthy weight management discussed.  Avoid or decrease alcohol consumption and medications that make you more sleepy, if possible. Notify if persistent daytime sleepiness occurs even with consistent use of PAP therapy.  Change CPAP supplies... Every month Mask cushions and/or nasal pillows CPAP machine filters Every 3 months Mask frame (not including the headgear) CPAP tubing Every 6 months Mask headgear Chin strap (if applicable) Humidifier water tub  Continue levalbuterol  1-2 puffs every 6 hours as needed for shortness of breath  Work on graded exercises   Follow up in 1 year with Dr. Isaiah. If symptoms do not improve or worsen, please contact office for sooner follow up or seek emergency care.    Restrictive lung disease DOE multifactorial related to restrictive lung disease in setting of obesity, CHF, and reactive airway disease. No evidence of ILD on past imaging. Clinically stable. Continue PRN SABA. Action plan in place. Graded exercises encouraged  Chronic systolic CHF (congestive heart  failure) (HCC) EF 35-40%. Suspect large component of DOE  related to CHF. Euvolemic on exam. Follow up with cardiology as scheduled   Advised if symptoms do not improve or worsen, to please contact office for sooner follow up or seek emergency care.   I spent 25 minutes of dedicated to the care of this patient on the date of this encounter to include pre-visit review of records, face-to-face time with the patient discussing conditions above, post visit ordering of testing, clinical documentation with the electronic health record, making appropriate referrals as documented, and communicating necessary findings to members of the patients care team.  Erin LULLA Rouleau, NP 10/28/2023  Pt aware and understands NP's role.

## 2023-10-28 NOTE — Assessment & Plan Note (Signed)
 Complex sleep apnea, managed with BiPAP. Excellent compliance. Good control with residual events <15/h, average of 9/h, which appears consistent for her when reviewing prior downloads. Receives benefit from use so will not make any further adjustments today. Aware of risks of untreated OSA and proper care/use of device. Healthy weight loss encouraged. Safe driving practices reviewed.  Patient Instructions  Continue to use BiPAP every night, minimum of 4-6 hours a night.  Change equipment as directed. Wash your tubing with warm soap and water daily, hang to dry. Wash humidifier portion weekly. Use bottled, distilled water and change daily Be aware of reduced alertness and do not drive or operate heavy machinery if experiencing this or drowsiness.  Exercise encouraged, as tolerated. Healthy weight management discussed.  Avoid or decrease alcohol consumption and medications that make you more sleepy, if possible. Notify if persistent daytime sleepiness occurs even with consistent use of PAP therapy.  Change CPAP supplies... Every month Mask cushions and/or nasal pillows CPAP machine filters Every 3 months Mask frame (not including the headgear) CPAP tubing Every 6 months Mask headgear Chin strap (if applicable) Humidifier water tub  Continue levalbuterol  1-2 puffs every 6 hours as needed for shortness of breath  Work on graded exercises   Follow up in 1 year with Dr. Isaiah. If symptoms do not improve or worsen, please contact office for sooner follow up or seek emergency care.

## 2023-10-30 ENCOUNTER — Ambulatory Visit: Payer: Self-pay | Admitting: Cardiology

## 2023-12-20 ENCOUNTER — Ambulatory Visit: Admitting: Internal Medicine

## 2023-12-20 ENCOUNTER — Other Ambulatory Visit: Payer: Self-pay | Admitting: Cardiovascular Disease

## 2023-12-27 ENCOUNTER — Other Ambulatory Visit

## 2023-12-27 ENCOUNTER — Encounter: Payer: Self-pay | Admitting: Internal Medicine

## 2023-12-27 ENCOUNTER — Ambulatory Visit (INDEPENDENT_AMBULATORY_CARE_PROVIDER_SITE_OTHER): Admitting: Internal Medicine

## 2023-12-27 VITALS — BP 118/60 | HR 75 | Ht 59.0 in | Wt 207.2 lb

## 2023-12-27 DIAGNOSIS — T462X5A Adverse effect of other antidysrhythmic drugs, initial encounter: Secondary | ICD-10-CM

## 2023-12-27 DIAGNOSIS — E0500 Thyrotoxicosis with diffuse goiter without thyrotoxic crisis or storm: Secondary | ICD-10-CM

## 2023-12-27 DIAGNOSIS — E0580 Other thyrotoxicosis without thyrotoxic crisis or storm: Secondary | ICD-10-CM

## 2023-12-27 NOTE — Patient Instructions (Signed)
 Please stop at the lab.  Please continue off Levothyroxine .  If we need to restart Levothyroxine , take the thyroid  hormone every day, with water, at least 30 minutes before breakfast, separated by at least 4 hours from: - acid reflux medications - calcium  - iron  - multivitamins  Please return in 6 months, but likely sooner for labs.

## 2023-12-27 NOTE — Progress Notes (Signed)
 Patient ID: Erin [name], female   DOB: 1945/11/15, 78 y.o.   MRN: [id number]  HPI  Erin [name] is a 78 y.o.-year-old female-year-old female, returning for follow-up for h/o amiodarone -induced thyrotoxicosis.  She previously saw Dr. Kassie, but last visit with me 6 months ago.  Interim history:  No fatigue, weight gain, cold intolerance, constipation.  She did have a weight gain of 4 pounds since last visit Also no tremors, palpitations, heat intolerance, anxiety, unintentional weight loss.  She continues on amiodarone  100 mg daily, the dose was not recently changed.  I reviewed patient's chart and also Dr. Laymond last note: Patient was on amiodarone  between 2014-2020.  This was restarted in 2022 at 200 mg daily.  She currently continues on this dose.  TFTs were fluctuating while on amiodarone , starting in 2015.  She was initially on methimazole , then came off and then restarted in 2018 and stopped in 2019.  She started levothyroxine  in 2020 but stopped in 12/2018 and started back on methimazole  in titration for cardioversion in 01/2019.  Methimazole  was again stopped in 09/2019 due to hypothyroidism and restarted in 12/2019.  At last visit with Dr. Kassie, in 03/2021, she was on methimazole  2.5 mg 3x a week >> this was stopped as a TSH was found to be 14.4.  Latest TSH obtained 3 months later was improved, but still abnormal, at 7.4.  She is on Inderal  LA 160 mg daily.  Amiodarone  was reduced to 100 mg daily in 02/2022.  We started levothyroxine  03/2022.  We stopped levothyroxine  50 mcg daily in 08/2022. Continues on Propranolol  SR 160 mg daily.  I reviewed pt's thyroid  tests: Lab Results  Component Value Date   TSH 5.180 (H) 10/11/2023   TSH 6.35 (H) 06/18/2023   TSH 0.374 (L) 02/02/2023   TSH 0.01 Repeated and verified X2. (L) 08/25/2022   TSH 3.91 05/11/2022   TSH 9.190 (H) 03/25/2022   TSH 6.60 (H) 02/24/2022   TSH 6.84 (H) 10/21/2021   TSH 7.400 (H) 07/03/2021   TSH  14.43 (H) 03/12/2021   FREET4 1.15 10/11/2023   FREET4 1.2 06/18/2023   FREET4 1.46 02/02/2023   FREET4 2.44 (H) 08/25/2022   FREET4 1.05 05/11/2022   FREET4 0.85 02/24/2022   FREET4 0.95 10/21/2021   FREET4 0.81 03/12/2021   FREET4 0.85 12/11/2020   FREET4 1.21 09/10/2020   T3FREE 2.2 10/11/2023   T3FREE 2.5 06/18/2023   T3FREE 2.6 02/02/2023   T3FREE 2.4 02/24/2022   T3FREE 2.7 10/21/2021   T3FREE 3.2 12/11/2020   T3FREE 1.8 (L) 09/10/2020   T3FREE 3.9 01/24/2019   T3FREE 3.9 01/10/2019   T3FREE 3.1 12/23/2018   Antithyroid antibodies: Lab Results  Component Value Date   TSI 184 (H) 10/21/2021   Component     Latest Ref Rng 10/21/2021  Thyroperoxidase Ab SerPl-aCnc     <9 IU/mL 1   Thyroglobulin Ab     < or = 1 IU/mL <1   TRAB     <=2.00 IU/L 2.69 (H)    Pt denies: - feeling nodules in neck - hoarseness - dysphagia - choking  Pt does not have a FH of thyroid  ds. No FH of thyroid  cancer. No h/o radiation tx to head or neck. No steroid use. No herbal supplements. No Biotin use.  Pt. also has a history of CAD, NICM, sCHF, atrial fibrillation, HTN, history of medication noncompliance.  ROS: + see HPI  Past Medical History:  Diagnosis Date   Anginal pain  Automatic implantable cardioverter-defibrillator in situ    a. s/p MDT ICD 07/2012; b. SN # [id number]    CAD (coronary artery disease)    a. cath 2003: LM nl, mid LAD 50%, LCx nl, RCA nl b. L&RHC 08/17/2014 100% occluded mid LCx, 70% mid LAD with FFR 0.82. Medical therapy. CI 2.1. CO 4.01c. LHC 8/18: CTO LCx w/ R-L colatts, LAD s/p PCI/DES x 2   Chronic systolic CHF (congestive heart failure) (HCC)    a. echo 2014: EF 25%, diffuse HK, LA moderately dilated, RA mildly dilated, PASP 38 mm Hg; b. echo 08/2014: EF 25-30%, diffuse HK, RWMA cannot be excluded, mild MR, mild biatrial enlargement, RV mildly dilated, wall thickness nl, pacer wire/catheter noted, mild to mod TR, PASP high nl, c. TTE 8/18: EF 25-30%,  diffuse HK, GR1DD, mod MR, mod dilated LA, nl RV sys fxn, PASP 56   Encounter for long-term (current) use of anticoagulants    Exertional shortness of breath    H/O medication noncompliance    High cholesterol    Hypertension    NICM (nonischemic cardiomyopathy) (HCC)    a. s/p AICD 07/22/2012   Obesity    OSA on CPAP    suppose to; not often (07/22/2012)   Other heavy-for-dates infants    PAF (paroxysmal atrial fibrillation) (HCC)    a. on warfarin--> Eliquis  08/2014   Past Surgical History:  Procedure Laterality Date   ABDOMINAL HYSTERECTOMY  ~ 1998   laparoscopic (07/22/2012)   CARDIAC CATHETERIZATION  ? 1990's   CARDIAC CATHETERIZATION N/A 08/17/2014   Procedure: Right/Left Heart Cath and Coronary Angiography;  Surgeon: Lonni JONETTA Cash, MD;  Location: [facility] INVASIVE CV LAB;  Service: Cardiovascular;  Laterality: N/A;   CARDIAC DEFIBRILLATOR PLACEMENT  07/22/2012   dual-chamber ICD.   CARDIOVERSION N/A 01/13/2019   Procedure: CARDIOVERSION;  Surgeon: Perla Evalene PARAS, MD;  Location: [facility] ORS;  Service: Cardiovascular;  Laterality: N/A;   CORONARY STENT INTERVENTION N/A 08/24/2016   Procedure: CORONARY STENT INTERVENTION;  Surgeon: Darron Deatrice LABOR, MD;  Location: [facility] INVASIVE CV LAB;  Service: Cardiovascular;  Laterality: N/A;   IMPLANTABLE CARDIOVERTER DEFIBRILLATOR IMPLANT N/A 07/22/2012   Procedure: IMPLANTABLE CARDIOVERTER DEFIBRILLATOR IMPLANT;  Surgeon: Danelle LELON Birmingham, MD;  Location: [facility] CATH LAB;  Service: Cardiovascular;  Laterality: N/A;   LEFT HEART CATH AND CORONARY ANGIOGRAPHY N/A 08/24/2016   Procedure: LEFT HEART CATH AND CORONARY ANGIOGRAPHY;  Surgeon: Darron Deatrice LABOR, MD;  Location: [facility] INVASIVE CV LAB;  Service: Cardiovascular;  Laterality: N/A;   PPM GENERATOR CHANGEOUT N/A 10/08/2022   Procedure: PPM GENERATOR CHANGEOUT;  Surgeon: Fernande Elspeth BROCKS, MD;  Location: [facility] INVASIVE CV LAB;  Service: Cardiovascular;  Laterality: N/A;   TEE WITHOUT CARDIOVERSION N/A  08/21/2014   Procedure: TRANSESOPHAGEAL ECHOCARDIOGRAM (TEE);  Surgeon: Annabella Scarce, MD;  Location: [facility] ENDOSCOPY;  Service: Cardiovascular;  Laterality: N/A;   TUBAL LIGATION  ~ 1978   Social History   Socioeconomic History   Marital status: Single    Spouse name: Not on file   Number of children: 3   Years of education: Not on file   Highest education level: Not on file  Occupational History   Occupation: Retired    Employer: [employer]  Tobacco Use   Smoking status: Never    Passive exposure: Never   Smokeless tobacco: Never  Vaping Use   Vaping status: Never Used  Substance and Sexual Activity   Alcohol use: No   Drug use: No   Sexual  activity: Never  Other Topics Concern   Not on file  Social History Narrative   Not on file   Social Drivers of Health   Tobacco Use: Low Risk (10/28/2023)   Patient History    Smoking Tobacco Use: Never    Smokeless Tobacco Use: Never    Passive Exposure: Never  Financial Resource Strain: Not on file  Food Insecurity: No Food Insecurity (05/21/2022)   Hunger Vital Sign    Worried About Running Out of Food in the Last Year: Never true    Ran Out of Food in the Last Year: Never true  Transportation Needs: No Transportation Needs (05/21/2022)   PRAPARE - Administrator, Civil Service (Medical): No    Lack of Transportation (Non-Medical): No  Physical Activity: Not on file  Stress: Not on file  Social Connections: Not on file  Intimate Partner Violence: Not At Risk (05/21/2022)   Humiliation, Afraid, Rape, and Kick questionnaire    Fear of Current or Ex-Partner: No    Emotionally Abused: No    Physically Abused: No    Sexually Abused: No  Depression (PHQ2-9): Not on file  Alcohol Screen: Not on file  Housing: Low Risk (05/21/2022)   Housing    Last Housing Risk Score: 0  Utilities: Not At Risk (05/21/2022)   AHC Utilities    Threatened with loss of utilities: No  Health Literacy: Not on file   Current  Outpatient Medications on File Prior to Visit  Medication Sig Dispense Refill   amiodarone  (PACERONE ) 200 MG tablet Take 0.5 tablets (100 mg total) by mouth daily. 45 tablet 2   apixaban  (ELIQUIS ) 5 MG TABS tablet Take 1 tablet by mouth twice daily 180 tablet 1   atorvastatin  (LIPITOR) 80 MG tablet Take 1 tablet (80 mg total) by mouth daily. 90 tablet 3   empagliflozin  (JARDIANCE ) 10 MG TABS tablet TAKE 1 TABLET BY MOUTH ONCE DAILY BEFORE BREAKFAST 90 tablet 3   ezetimibe  (ZETIA ) 10 MG tablet TAKE 1 TABLET BY MOUTH DAILY 90 tablet 3   fluticasone  (FLONASE ) 50 MCG/ACT nasal spray Place 1 spray into both nostrils at bedtime. 16 g 2   levalbuterol  (XOPENEX  HFA) 45 MCG/ACT inhaler Inhale 1-2 puffs into the lungs every 6 (six) hours as needed for wheezing or shortness of breath. 15 g 3   propranolol  ER (INDERAL  LA) 160 MG SR capsule TAKE 1 CAPSULE BY MOUTH DAILY 100 capsule 2   sacubitril -valsartan  (ENTRESTO ) 24-26 MG Take 1 tablet by mouth 2 (two) times daily. 180 tablet 1   spironolactone  (ALDACTONE ) 25 MG tablet TAKE ONE-HALF TABLET BY MOUTH  DAILY 45 tablet 3   torsemide  (DEMADEX ) 20 MG tablet TAKE 2 TABLETS BY MOUTH DAILY 180 tablet 3   No current facility-administered medications on file prior to visit.   No Known Allergies Family History  Problem Relation Age of Onset   Heart disease Mother    Heart disease Father    Lung disease Sister    Diabetes Sister    Heart failure Brother    Thyroid  disease Neg Hx    PE: BP 118/60   Pulse 75   Ht 4' 11 (1.499 m)   Wt 207 lb 3.2 oz (94 kg)   SpO2 98%   BMI 41.85 kg/m  Wt Readings from Last 10 Encounters:  12/27/23 207 lb 3.2 oz (94 kg)  10/28/23 207 lb (93.9 kg)  10/11/23 203 lb (92.1 kg)  10/11/23 202 lb 12.8 oz (92 kg)  06/18/23 203 lb (92.1 kg)  04/28/23 200 lb 6.4 oz (90.9 kg)  02/05/23 196 lb 9.6 oz (89.2 kg)  01/11/23 196 lb 9.6 oz (89.2 kg)  10/08/22 196 lb (88.9 kg)  09/17/22 199 lb (90.3 kg)   Constitutional:  overweight, in NAD, she walks with a walker Eyes:  EOMI, no exophthalmos ENT: no neck masses, no cervical lymphadenopathy Cardiovascular: RRR, No MRG Respiratory: CTA B Musculoskeletal: no deformities Skin:no rashes Neurological: no tremor with outstretched hands  ASSESSMENT: 1.  History of amiodarone  induced thyrotoxicosis - Fluctuating TFTs over the years  2.  Mild Graves' disease  PLAN:  1. Patient with history of widely fluctuating thyroid  tests previously on both methimazole  and levothyroxine .  It was unclear why her TFTs were fluctuating to this extent in the past since it was not completely expected from her diagnosis of amiodarone -induced thyrotoxicosis.  We discussed that it was possible to have elevated TRAb antibodies with alternating stimulating and suppressing antibodies.  Indeed, her TSI's and also TRAb antibodies were positive. -We stopped methimazole  when TSH increased to 14.  TSH increased afterwards to approximately 9 in 03/2022 and at that time we discussed about adding low-dose levothyroxine  especially as she also had a clinical picture of hypothyroidism (weight gain, fatigue, cold intolerance).  Symptoms improved afterwards but TSH became suppressed to 0.01, so we stopped levothyroxine  2024. - Her hypothyroidism  remains uncontrolled with the latest TSH: Lab Results  Component Value Date   TSH 5.180 (H) 10/11/2023  - She continues off levothyroxine .  She is on Inderal  and amiodarone  stable dose. - She feels well today, without thyrotoxic or hypothyroid complaints.  She gained 4 lbs since last OV. - We will recheck her TFTs.  At today's visit, we discussed that the slightly elevated TSH is more acceptable in her case compared to a slightly low TSH due to her history of arrhythmia.  However, if the TSH increases more significantly above the target range, we may need to start back on the low-dose levothyroxine .  She agrees with this. - RTC in 6 months or possibly sooner for  labs  2.  Mild Graves' disease - Had mildly elevated TSI's at last check -- she has no active signs of Graves' ophthalmopathy: No blurry vision, double vision, eye pain, chemosis  Her vision improved significantly after cataract surgery. - Will continue to keep an eye on this  Orders Placed This Encounter  Procedures   TSH   T4, free   T3, free   Lela Fendt, MD PhD [facility]

## 2023-12-28 ENCOUNTER — Ambulatory Visit: Payer: Self-pay | Admitting: Internal Medicine

## 2023-12-28 ENCOUNTER — Other Ambulatory Visit: Payer: Self-pay | Admitting: Cardiovascular Disease

## 2023-12-28 LAB — T3, FREE: T3, Free: 2.6 pg/mL (ref 2.3–4.2)

## 2023-12-28 LAB — TSH: TSH: 5.18 m[IU]/L — ABNORMAL HIGH (ref 0.40–4.50)

## 2023-12-28 LAB — T4, FREE: Free T4: 1.2 ng/dL (ref 0.8–1.8)

## 2024-01-07 ENCOUNTER — Ambulatory Visit (INDEPENDENT_AMBULATORY_CARE_PROVIDER_SITE_OTHER): Payer: Medicare Other

## 2024-01-07 DIAGNOSIS — I472 Ventricular tachycardia, unspecified: Secondary | ICD-10-CM

## 2024-01-09 LAB — CUP PACEART REMOTE DEVICE CHECK
Battery Remaining Longevity: 139 mo
Battery Voltage: 3.03 V
Brady Statistic AP VP Percent: 0.01 %
Brady Statistic AP VS Percent: 0.91 %
Brady Statistic AS VP Percent: 0.07 %
Brady Statistic AS VS Percent: 99.01 %
Brady Statistic RA Percent Paced: 1 %
Brady Statistic RV Percent Paced: 0.08 %
Date Time Interrogation Session: 20260102013908
HighPow Impedance: 66 Ohm
Implantable Lead Connection Status: 753985
Implantable Lead Connection Status: 753985
Implantable Lead Implant Date: 20140718
Implantable Lead Implant Date: 20140718
Implantable Lead Location: 753859
Implantable Lead Location: 753860
Implantable Lead Model: 5076
Implantable Lead Model: 6935
Implantable Pulse Generator Implant Date: 20241003
Lead Channel Impedance Value: 266 Ohm
Lead Channel Impedance Value: 323 Ohm
Lead Channel Impedance Value: 342 Ohm
Lead Channel Pacing Threshold Amplitude: 0.5 V
Lead Channel Pacing Threshold Amplitude: 0.625 V
Lead Channel Pacing Threshold Pulse Width: 0.4 ms
Lead Channel Pacing Threshold Pulse Width: 0.4 ms
Lead Channel Sensing Intrinsic Amplitude: 11.1 mV
Lead Channel Sensing Intrinsic Amplitude: 2.9 mV
Lead Channel Setting Pacing Amplitude: 1.5 V
Lead Channel Setting Pacing Amplitude: 2 V
Lead Channel Setting Pacing Pulse Width: 0.4 ms
Lead Channel Setting Sensing Sensitivity: 0.3 mV
Zone Setting Status: 755011
Zone Setting Status: 755011

## 2024-01-11 NOTE — Progress Notes (Signed)
 Remote ICD Transmission

## 2024-01-15 ENCOUNTER — Ambulatory Visit: Payer: Self-pay | Admitting: Cardiology

## 2024-02-01 ENCOUNTER — Other Ambulatory Visit: Payer: Self-pay | Admitting: Cardiovascular Disease

## 2024-02-04 ENCOUNTER — Other Ambulatory Visit: Payer: Self-pay | Admitting: Cardiovascular Disease

## 2024-02-08 ENCOUNTER — Telehealth: Payer: Self-pay

## 2024-02-08 DIAGNOSIS — R0609 Other forms of dyspnea: Secondary | ICD-10-CM

## 2024-02-08 MED ORDER — LEVALBUTEROL TARTRATE 45 MCG/ACT IN AERO: 1.0000 | INHALATION_SPRAY | Freq: Four times a day (QID) | RESPIRATORY_TRACT | 11 refills | Status: AC | PRN

## 2024-02-08 NOTE — Telephone Encounter (Signed)
 In accordance with refill protocols, please review and address the following requirements before this medication refill can be authorized:  Chest X-ray, Labs , and Other testing

## 2024-02-08 NOTE — Telephone Encounter (Signed)
 I have notified the patient. Nothing further needed.

## 2024-03-30 ENCOUNTER — Ambulatory Visit: Admitting: Physician Assistant

## 2024-06-26 ENCOUNTER — Ambulatory Visit: Admitting: Internal Medicine
